# Patient Record
Sex: Female | Born: 1950
Health system: Southern US, Community
[De-identification: ages and names within clinical notes are randomized; demographics above are authoritative.]

## PROBLEM LIST (undated history)

## (undated) ENCOUNTER — Emergency Department (HOSPITAL_COMMUNITY): Discharge status: Absent without leave | Payer: Medicare Other | Source: Home / Self Care

## (undated) DIAGNOSIS — M5136 Other intervertebral disc degeneration, lumbar region: Secondary | ICD-10-CM

## (undated) DIAGNOSIS — T148XXA Other injury of unspecified body region, initial encounter: Secondary | ICD-10-CM

## (undated) DIAGNOSIS — D649 Anemia, unspecified: Secondary | ICD-10-CM

## (undated) DIAGNOSIS — Z87898 Personal history of other specified conditions: Secondary | ICD-10-CM

## (undated) DIAGNOSIS — I10 Essential (primary) hypertension: Secondary | ICD-10-CM

## (undated) DIAGNOSIS — C50919 Malignant neoplasm of unspecified site of unspecified female breast: Secondary | ICD-10-CM

## (undated) DIAGNOSIS — G4730 Sleep apnea, unspecified: Secondary | ICD-10-CM

## (undated) DIAGNOSIS — R7611 Nonspecific reaction to tuberculin skin test without active tuberculosis: Secondary | ICD-10-CM

## (undated) DIAGNOSIS — B1910 Unspecified viral hepatitis B without hepatic coma: Secondary | ICD-10-CM

## (undated) DIAGNOSIS — Z87442 Personal history of urinary calculi: Secondary | ICD-10-CM

## (undated) DIAGNOSIS — E039 Hypothyroidism, unspecified: Secondary | ICD-10-CM

## (undated) DIAGNOSIS — C50019 Malignant neoplasm of nipple and areola, unspecified female breast: Secondary | ICD-10-CM

## (undated) DIAGNOSIS — G35 Multiple sclerosis: Secondary | ICD-10-CM

## (undated) DIAGNOSIS — H5711 Ocular pain, right eye: Secondary | ICD-10-CM

## (undated) DIAGNOSIS — E2740 Unspecified adrenocortical insufficiency: Secondary | ICD-10-CM

## (undated) DIAGNOSIS — Z8489 Family history of other specified conditions: Secondary | ICD-10-CM

## (undated) DIAGNOSIS — R233 Spontaneous ecchymoses: Secondary | ICD-10-CM

## (undated) DIAGNOSIS — Q780 Osteogenesis imperfecta: Secondary | ICD-10-CM

## (undated) DIAGNOSIS — M797 Fibromyalgia: Secondary | ICD-10-CM

## (undated) DIAGNOSIS — R112 Nausea with vomiting, unspecified: Secondary | ICD-10-CM

## (undated) DIAGNOSIS — I878 Other specified disorders of veins: Secondary | ICD-10-CM

## (undated) DIAGNOSIS — E271 Primary adrenocortical insufficiency: Secondary | ICD-10-CM

## (undated) DIAGNOSIS — G4733 Obstructive sleep apnea (adult) (pediatric): Secondary | ICD-10-CM

## (undated) DIAGNOSIS — R238 Other skin changes: Secondary | ICD-10-CM

## (undated) DIAGNOSIS — G43909 Migraine, unspecified, not intractable, without status migrainosus: Secondary | ICD-10-CM

## (undated) DIAGNOSIS — J189 Pneumonia, unspecified organism: Secondary | ICD-10-CM

## (undated) DIAGNOSIS — Z9889 Other specified postprocedural states: Secondary | ICD-10-CM

## (undated) DIAGNOSIS — S069X9A Unspecified intracranial injury with loss of consciousness of unspecified duration, initial encounter: Secondary | ICD-10-CM

## (undated) DIAGNOSIS — T66XXXA Radiation sickness, unspecified, initial encounter: Secondary | ICD-10-CM

## (undated) DIAGNOSIS — G8929 Other chronic pain: Secondary | ICD-10-CM

## (undated) DIAGNOSIS — K219 Gastro-esophageal reflux disease without esophagitis: Secondary | ICD-10-CM

## (undated) DIAGNOSIS — B029 Zoster without complications: Secondary | ICD-10-CM

## (undated) DIAGNOSIS — Z87448 Personal history of other diseases of urinary system: Secondary | ICD-10-CM

## (undated) DIAGNOSIS — G971 Other reaction to spinal and lumbar puncture: Secondary | ICD-10-CM

## (undated) DIAGNOSIS — IMO0002 Reserved for concepts with insufficient information to code with codable children: Secondary | ICD-10-CM

## (undated) DIAGNOSIS — M1990 Unspecified osteoarthritis, unspecified site: Secondary | ICD-10-CM

## (undated) DIAGNOSIS — IMO0001 Reserved for inherently not codable concepts without codable children: Secondary | ICD-10-CM

## (undated) DIAGNOSIS — R229 Localized swelling, mass and lump, unspecified: Secondary | ICD-10-CM

## (undated) DIAGNOSIS — Z8719 Personal history of other diseases of the digestive system: Secondary | ICD-10-CM

## (undated) DIAGNOSIS — J449 Chronic obstructive pulmonary disease, unspecified: Secondary | ICD-10-CM

## (undated) DIAGNOSIS — N393 Stress incontinence (female) (male): Secondary | ICD-10-CM

## (undated) DIAGNOSIS — R296 Repeated falls: Secondary | ICD-10-CM

## (undated) DIAGNOSIS — G2581 Restless legs syndrome: Secondary | ICD-10-CM

## (undated) DIAGNOSIS — K922 Gastrointestinal hemorrhage, unspecified: Secondary | ICD-10-CM

## (undated) DIAGNOSIS — R51 Headache: Secondary | ICD-10-CM

## (undated) DIAGNOSIS — M51369 Other intervertebral disc degeneration, lumbar region without mention of lumbar back pain or lower extremity pain: Secondary | ICD-10-CM

## (undated) HISTORY — PX: LAPAROSCOPY: SHX197

## (undated) HISTORY — PX: COLONOSCOPY: SHX174

## (undated) HISTORY — PX: SALIVARY GLAND SURGERY: SHX768

## (undated) HISTORY — PX: CHOLECYSTECTOMY: SHX55

## (undated) HISTORY — PX: BACK SURGERY: SHX140

## (undated) HISTORY — PX: APPENDECTOMY: SHX54

---
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
## 1977-12-04 HISTORY — PX: BAND HEMORRHOIDECTOMY: SHX1213

## 1985-12-04 HISTORY — PX: ABDOMINAL HYSTERECTOMY: SHX81

## 1986-12-04 HISTORY — PX: BILATERAL OOPHORECTOMY: SHX1221

## 1988-05-04 DIAGNOSIS — G35 Multiple sclerosis: Secondary | ICD-10-CM

## 1988-05-04 HISTORY — DX: Multiple sclerosis: G35

## 1992-07-13 DIAGNOSIS — C50919 Malignant neoplasm of unspecified site of unspecified female breast: Secondary | ICD-10-CM

## 1992-07-13 HISTORY — DX: Malignant neoplasm of unspecified site of unspecified female breast: C50.919

## 1992-07-13 HISTORY — PX: BREAST LUMPECTOMY: SHX2

## 1998-05-25 ENCOUNTER — Encounter: Admission: RE | Admit: 1998-05-25 | Discharge: 1998-08-23 | Payer: Self-pay | Admitting: *Deleted

## 1998-05-26 ENCOUNTER — Inpatient Hospital Stay (HOSPITAL_COMMUNITY): Admission: AD | Admit: 1998-05-26 | Discharge: 1998-05-30 | Payer: Self-pay | Admitting: *Deleted

## 1998-05-30 ENCOUNTER — Emergency Department (HOSPITAL_COMMUNITY): Admission: EM | Admit: 1998-05-30 | Discharge: 1998-05-31 | Payer: Self-pay

## 1998-06-03 ENCOUNTER — Ambulatory Visit (HOSPITAL_COMMUNITY): Admission: RE | Admit: 1998-06-03 | Discharge: 1998-06-03 | Payer: Self-pay | Admitting: Family Medicine

## 1998-06-04 ENCOUNTER — Ambulatory Visit (HOSPITAL_COMMUNITY): Admission: RE | Admit: 1998-06-04 | Discharge: 1998-06-04 | Payer: Self-pay | Admitting: Family Medicine

## 1998-06-08 ENCOUNTER — Ambulatory Visit (HOSPITAL_COMMUNITY): Admission: RE | Admit: 1998-06-08 | Discharge: 1998-06-08 | Payer: Self-pay | Admitting: Family Medicine

## 1998-08-04 ENCOUNTER — Ambulatory Visit (HOSPITAL_COMMUNITY): Admission: RE | Admit: 1998-08-04 | Discharge: 1998-08-04 | Payer: Self-pay | Admitting: Family Medicine

## 1998-11-17 ENCOUNTER — Inpatient Hospital Stay (HOSPITAL_COMMUNITY): Admission: AD | Admit: 1998-11-17 | Discharge: 1998-11-23 | Payer: Self-pay | Admitting: *Deleted

## 1998-11-18 ENCOUNTER — Encounter: Payer: Self-pay | Admitting: Family Medicine

## 1998-11-19 ENCOUNTER — Encounter: Payer: Self-pay | Admitting: Internal Medicine

## 1998-11-21 ENCOUNTER — Encounter: Payer: Self-pay | Admitting: Internal Medicine

## 1998-12-22 ENCOUNTER — Inpatient Hospital Stay (HOSPITAL_COMMUNITY): Admission: EM | Admit: 1998-12-22 | Discharge: 1998-12-27 | Payer: Self-pay | Admitting: Internal Medicine

## 1998-12-25 ENCOUNTER — Encounter: Payer: Self-pay | Admitting: Internal Medicine

## 1999-03-17 ENCOUNTER — Ambulatory Visit (HOSPITAL_COMMUNITY): Admission: RE | Admit: 1999-03-17 | Discharge: 1999-03-17 | Payer: Self-pay | Admitting: Family Medicine

## 1999-03-17 ENCOUNTER — Encounter: Payer: Self-pay | Admitting: Family Medicine

## 1999-11-30 ENCOUNTER — Encounter: Admission: RE | Admit: 1999-11-30 | Discharge: 1999-11-30 | Payer: Self-pay | Admitting: Oncology

## 1999-11-30 ENCOUNTER — Encounter: Payer: Self-pay | Admitting: Oncology

## 2000-04-18 ENCOUNTER — Ambulatory Visit (HOSPITAL_COMMUNITY): Admission: RE | Admit: 2000-04-18 | Discharge: 2000-04-18 | Payer: Self-pay | Admitting: Neurological Surgery

## 2000-04-18 ENCOUNTER — Encounter: Payer: Self-pay | Admitting: Neurological Surgery

## 2000-05-02 ENCOUNTER — Ambulatory Visit (HOSPITAL_COMMUNITY): Admission: RE | Admit: 2000-05-02 | Discharge: 2000-05-02 | Payer: Self-pay | Admitting: Neurological Surgery

## 2000-05-02 ENCOUNTER — Encounter: Payer: Self-pay | Admitting: Neurological Surgery

## 2000-09-12 ENCOUNTER — Other Ambulatory Visit: Admission: RE | Admit: 2000-09-12 | Discharge: 2000-09-12 | Payer: Self-pay | Admitting: *Deleted

## 2000-12-03 ENCOUNTER — Encounter: Admission: RE | Admit: 2000-12-03 | Discharge: 2000-12-03 | Payer: Self-pay | Admitting: Oncology

## 2000-12-03 ENCOUNTER — Encounter: Payer: Self-pay | Admitting: Oncology

## 2001-01-09 ENCOUNTER — Encounter: Payer: Self-pay | Admitting: Oncology

## 2001-01-09 ENCOUNTER — Encounter: Admission: RE | Admit: 2001-01-09 | Discharge: 2001-01-09 | Payer: Self-pay | Admitting: Oncology

## 2001-01-10 ENCOUNTER — Encounter: Payer: Self-pay | Admitting: Oncology

## 2001-01-10 ENCOUNTER — Encounter: Admission: RE | Admit: 2001-01-10 | Discharge: 2001-01-10 | Payer: Self-pay | Admitting: Oncology

## 2001-01-23 ENCOUNTER — Ambulatory Visit (HOSPITAL_COMMUNITY): Admission: RE | Admit: 2001-01-23 | Discharge: 2001-01-23 | Payer: Self-pay | Admitting: Gastroenterology

## 2001-02-27 ENCOUNTER — Encounter: Payer: Self-pay | Admitting: Specialist

## 2001-02-27 ENCOUNTER — Ambulatory Visit (HOSPITAL_COMMUNITY): Admission: RE | Admit: 2001-02-27 | Discharge: 2001-02-27 | Payer: Self-pay | Admitting: Specialist

## 2001-03-04 ENCOUNTER — Encounter: Admission: RE | Admit: 2001-03-04 | Discharge: 2001-03-04 | Payer: Self-pay | Admitting: Radiology

## 2001-03-04 ENCOUNTER — Encounter: Payer: Self-pay | Admitting: Radiology

## 2001-03-07 ENCOUNTER — Encounter: Admission: RE | Admit: 2001-03-07 | Discharge: 2001-03-07 | Payer: Self-pay | Admitting: Oncology

## 2001-03-07 ENCOUNTER — Encounter: Payer: Self-pay | Admitting: Oncology

## 2001-07-07 ENCOUNTER — Inpatient Hospital Stay (HOSPITAL_COMMUNITY): Admission: EM | Admit: 2001-07-07 | Discharge: 2001-07-17 | Payer: Self-pay

## 2001-07-08 ENCOUNTER — Encounter: Payer: Self-pay | Admitting: Neurology

## 2001-07-09 ENCOUNTER — Encounter: Payer: Self-pay | Admitting: Neurology

## 2001-07-23 ENCOUNTER — Encounter: Admission: RE | Admit: 2001-07-23 | Discharge: 2001-08-03 | Payer: Self-pay | Admitting: Anesthesiology

## 2001-08-20 ENCOUNTER — Inpatient Hospital Stay (HOSPITAL_COMMUNITY): Admission: AD | Admit: 2001-08-20 | Discharge: 2001-08-24 | Payer: Self-pay | Admitting: Obstetrics and Gynecology

## 2001-08-21 ENCOUNTER — Encounter: Payer: Self-pay | Admitting: Internal Medicine

## 2001-12-09 ENCOUNTER — Encounter: Payer: Self-pay | Admitting: Oncology

## 2001-12-09 ENCOUNTER — Encounter: Admission: RE | Admit: 2001-12-09 | Discharge: 2001-12-09 | Payer: Self-pay | Admitting: Oncology

## 2002-01-01 ENCOUNTER — Encounter: Admission: RE | Admit: 2002-01-01 | Discharge: 2002-01-01 | Payer: Self-pay | Admitting: *Deleted

## 2002-01-01 ENCOUNTER — Encounter: Payer: Self-pay | Admitting: *Deleted

## 2002-04-15 ENCOUNTER — Encounter: Payer: Self-pay | Admitting: *Deleted

## 2002-04-15 ENCOUNTER — Encounter: Admission: RE | Admit: 2002-04-15 | Discharge: 2002-04-15 | Payer: Self-pay | Admitting: *Deleted

## 2002-05-23 ENCOUNTER — Inpatient Hospital Stay (HOSPITAL_COMMUNITY): Admission: EM | Admit: 2002-05-23 | Discharge: 2002-06-04 | Payer: Self-pay | Admitting: Emergency Medicine

## 2002-08-21 ENCOUNTER — Inpatient Hospital Stay (HOSPITAL_COMMUNITY): Admission: AD | Admit: 2002-08-21 | Discharge: 2002-09-05 | Payer: Self-pay | Admitting: Pediatrics

## 2002-08-21 ENCOUNTER — Encounter: Payer: Self-pay | Admitting: Pediatrics

## 2002-08-27 ENCOUNTER — Encounter: Admission: RE | Admit: 2002-08-27 | Discharge: 2002-08-27 | Payer: Self-pay | Admitting: Pediatrics

## 2002-08-27 ENCOUNTER — Encounter: Payer: Self-pay | Admitting: Pediatrics

## 2002-12-16 ENCOUNTER — Ambulatory Visit (HOSPITAL_COMMUNITY): Admission: RE | Admit: 2002-12-16 | Discharge: 2002-12-16 | Payer: Self-pay | Admitting: Pediatrics

## 2002-12-16 ENCOUNTER — Encounter: Payer: Self-pay | Admitting: Pediatrics

## 2003-01-05 ENCOUNTER — Encounter: Payer: Self-pay | Admitting: Neurological Surgery

## 2003-01-05 ENCOUNTER — Observation Stay (HOSPITAL_COMMUNITY): Admission: RE | Admit: 2003-01-05 | Discharge: 2003-01-06 | Payer: Self-pay | Admitting: Neurological Surgery

## 2003-01-11 ENCOUNTER — Emergency Department (HOSPITAL_COMMUNITY): Admission: EM | Admit: 2003-01-11 | Discharge: 2003-01-11 | Payer: Self-pay

## 2003-02-10 ENCOUNTER — Emergency Department (HOSPITAL_COMMUNITY): Admission: EM | Admit: 2003-02-10 | Discharge: 2003-02-10 | Payer: Self-pay | Admitting: Emergency Medicine

## 2003-02-10 ENCOUNTER — Encounter: Payer: Self-pay | Admitting: Emergency Medicine

## 2003-03-05 ENCOUNTER — Encounter: Admission: RE | Admit: 2003-03-05 | Discharge: 2003-03-05 | Payer: Self-pay | Admitting: Oncology

## 2003-03-05 ENCOUNTER — Encounter: Payer: Self-pay | Admitting: Oncology

## 2003-03-09 ENCOUNTER — Encounter (HOSPITAL_COMMUNITY): Admission: RE | Admit: 2003-03-09 | Discharge: 2003-03-09 | Payer: Self-pay | Admitting: Oncology

## 2003-10-07 ENCOUNTER — Ambulatory Visit (HOSPITAL_COMMUNITY): Admission: RE | Admit: 2003-10-07 | Discharge: 2003-10-07 | Payer: Self-pay | Admitting: Gastroenterology

## 2003-12-17 ENCOUNTER — Encounter: Admission: RE | Admit: 2003-12-17 | Discharge: 2003-12-17 | Payer: Self-pay | Admitting: Neurological Surgery

## 2004-01-05 ENCOUNTER — Ambulatory Visit (HOSPITAL_COMMUNITY): Admission: RE | Admit: 2004-01-05 | Discharge: 2004-01-05 | Payer: Self-pay | Admitting: Neurological Surgery

## 2004-01-21 ENCOUNTER — Ambulatory Visit (HOSPITAL_COMMUNITY): Admission: RE | Admit: 2004-01-21 | Discharge: 2004-01-22 | Payer: Self-pay | Admitting: Neurological Surgery

## 2004-03-14 ENCOUNTER — Ambulatory Visit (HOSPITAL_COMMUNITY): Admission: RE | Admit: 2004-03-14 | Discharge: 2004-03-14 | Payer: Self-pay | Admitting: Neurological Surgery

## 2004-03-28 ENCOUNTER — Inpatient Hospital Stay (HOSPITAL_COMMUNITY): Admission: EM | Admit: 2004-03-28 | Discharge: 2004-04-13 | Payer: Self-pay

## 2004-04-20 ENCOUNTER — Encounter: Admission: RE | Admit: 2004-04-20 | Discharge: 2004-04-20 | Payer: Self-pay | Admitting: Oncology

## 2004-09-26 ENCOUNTER — Inpatient Hospital Stay (HOSPITAL_COMMUNITY): Admission: AD | Admit: 2004-09-26 | Discharge: 2004-10-19 | Payer: Self-pay | Admitting: Urology

## 2005-01-04 ENCOUNTER — Ambulatory Visit (HOSPITAL_COMMUNITY): Admission: RE | Admit: 2005-01-04 | Discharge: 2005-01-04 | Payer: Self-pay | Admitting: Family Medicine

## 2005-01-12 ENCOUNTER — Ambulatory Visit: Payer: Self-pay | Admitting: Internal Medicine

## 2005-02-13 ENCOUNTER — Ambulatory Visit (HOSPITAL_COMMUNITY): Admission: RE | Admit: 2005-02-13 | Discharge: 2005-02-13 | Payer: Self-pay | Admitting: Gastroenterology

## 2005-02-22 ENCOUNTER — Ambulatory Visit: Payer: Self-pay | Admitting: Internal Medicine

## 2005-03-28 ENCOUNTER — Ambulatory Visit: Payer: Self-pay | Admitting: Internal Medicine

## 2005-03-30 ENCOUNTER — Encounter: Admission: RE | Admit: 2005-03-30 | Discharge: 2005-03-30 | Payer: Self-pay | Admitting: *Deleted

## 2005-04-28 ENCOUNTER — Ambulatory Visit: Payer: Self-pay | Admitting: Oncology

## 2005-07-21 ENCOUNTER — Ambulatory Visit: Admission: RE | Admit: 2005-07-21 | Discharge: 2005-07-21 | Payer: Self-pay | Admitting: Family Medicine

## 2005-07-28 ENCOUNTER — Ambulatory Visit: Payer: Self-pay | Admitting: Internal Medicine

## 2005-08-10 ENCOUNTER — Ambulatory Visit: Payer: Self-pay | Admitting: Internal Medicine

## 2005-10-16 ENCOUNTER — Ambulatory Visit (HOSPITAL_COMMUNITY): Admission: RE | Admit: 2005-10-16 | Discharge: 2005-10-16 | Payer: Self-pay | Admitting: Family Medicine

## 2005-10-25 ENCOUNTER — Ambulatory Visit (HOSPITAL_COMMUNITY): Admission: RE | Admit: 2005-10-25 | Discharge: 2005-10-25 | Payer: Self-pay | Admitting: Family Medicine

## 2005-10-25 ENCOUNTER — Encounter: Payer: Self-pay | Admitting: Cardiology

## 2005-10-25 ENCOUNTER — Ambulatory Visit: Payer: Self-pay | Admitting: Cardiology

## 2005-11-17 ENCOUNTER — Encounter: Admission: RE | Admit: 2005-11-17 | Discharge: 2005-12-03 | Payer: Self-pay | Admitting: Family Medicine

## 2006-04-04 ENCOUNTER — Encounter: Payer: Self-pay | Admitting: Family Medicine

## 2006-07-19 ENCOUNTER — Emergency Department (HOSPITAL_COMMUNITY): Admission: EM | Admit: 2006-07-19 | Discharge: 2006-07-20 | Payer: Self-pay | Admitting: Emergency Medicine

## 2007-03-12 ENCOUNTER — Ambulatory Visit: Payer: Self-pay | Admitting: Pulmonary Disease

## 2007-03-12 LAB — CONVERTED CEMR LAB
AST: 28 units/L (ref 0–37)
Albumin: 3.2 g/dL — ABNORMAL LOW (ref 3.5–5.2)
Alkaline Phosphatase: 103 units/L (ref 39–117)
BUN: 16 mg/dL (ref 6–23)
Basophils Absolute: 0 10*3/uL (ref 0.0–0.1)
Basophils Relative: 0.3 % (ref 0.0–1.0)
CO2: 34 meq/L — ABNORMAL HIGH (ref 19–32)
Chloride: 102 meq/L (ref 96–112)
Creatinine, Ser: 0.6 mg/dL (ref 0.4–1.2)
HCT: 37.5 % (ref 36.0–46.0)
Hemoglobin: 12.6 g/dL (ref 12.0–15.0)
Monocytes Absolute: 0.5 10*3/uL (ref 0.2–0.7)
Monocytes Relative: 6.9 % (ref 3.0–11.0)
Neutrophils Relative %: 58.1 % (ref 43.0–77.0)
Potassium: 3.4 meq/L — ABNORMAL LOW (ref 3.5–5.1)
Pro B Natriuretic peptide (BNP): 147 pg/mL — ABNORMAL HIGH (ref 0.0–100.0)
RBC: 4.63 M/uL (ref 3.87–5.11)
RDW: 15 % — ABNORMAL HIGH (ref 11.5–14.6)
Total Bilirubin: 0.4 mg/dL (ref 0.3–1.2)
Total Protein: 7.6 g/dL (ref 6.0–8.3)

## 2007-03-15 ENCOUNTER — Ambulatory Visit: Payer: Self-pay | Admitting: Pulmonary Disease

## 2007-03-15 ENCOUNTER — Ambulatory Visit (HOSPITAL_COMMUNITY): Admission: RE | Admit: 2007-03-15 | Discharge: 2007-03-15 | Payer: Self-pay | Admitting: Pulmonary Disease

## 2007-03-19 ENCOUNTER — Ambulatory Visit: Payer: Self-pay | Admitting: *Deleted

## 2007-03-19 ENCOUNTER — Ambulatory Visit: Payer: Self-pay | Admitting: Internal Medicine

## 2007-04-02 ENCOUNTER — Ambulatory Visit: Payer: Self-pay | Admitting: Internal Medicine

## 2007-04-23 ENCOUNTER — Ambulatory Visit: Payer: Self-pay | Admitting: Internal Medicine

## 2007-04-26 ENCOUNTER — Encounter: Admission: RE | Admit: 2007-04-26 | Discharge: 2007-04-26 | Payer: Self-pay | Admitting: Oncology

## 2007-09-03 ENCOUNTER — Encounter: Admission: RE | Admit: 2007-09-03 | Discharge: 2007-09-03 | Payer: Self-pay | Admitting: Family Medicine

## 2007-09-25 ENCOUNTER — Encounter: Admission: RE | Admit: 2007-09-25 | Discharge: 2007-09-25 | Payer: Self-pay | Admitting: Family Medicine

## 2007-10-12 ENCOUNTER — Encounter: Admission: RE | Admit: 2007-10-12 | Discharge: 2007-10-12 | Payer: Self-pay | Admitting: Family Medicine

## 2008-01-04 ENCOUNTER — Emergency Department (HOSPITAL_COMMUNITY): Admission: EM | Admit: 2008-01-04 | Discharge: 2008-01-04 | Payer: Self-pay | Admitting: Emergency Medicine

## 2008-02-24 ENCOUNTER — Inpatient Hospital Stay (HOSPITAL_COMMUNITY): Admission: AD | Admit: 2008-02-24 | Discharge: 2008-03-03 | Payer: Self-pay | Admitting: Internal Medicine

## 2008-03-26 ENCOUNTER — Encounter: Payer: Self-pay | Admitting: Family Medicine

## 2008-03-26 ENCOUNTER — Ambulatory Visit: Payer: Self-pay | Admitting: Vascular Surgery

## 2008-03-26 ENCOUNTER — Ambulatory Visit (HOSPITAL_COMMUNITY): Admission: RE | Admit: 2008-03-26 | Discharge: 2008-03-26 | Payer: Self-pay | Admitting: Family Medicine

## 2008-04-29 ENCOUNTER — Encounter: Admission: RE | Admit: 2008-04-29 | Discharge: 2008-04-29 | Payer: Self-pay | Admitting: Family Medicine

## 2008-05-14 ENCOUNTER — Ambulatory Visit: Payer: Self-pay | Admitting: Internal Medicine

## 2008-05-14 ENCOUNTER — Inpatient Hospital Stay (HOSPITAL_COMMUNITY): Admission: AD | Admit: 2008-05-14 | Discharge: 2008-05-21 | Payer: Self-pay | Admitting: Internal Medicine

## 2008-05-15 ENCOUNTER — Encounter (INDEPENDENT_AMBULATORY_CARE_PROVIDER_SITE_OTHER): Payer: Self-pay | Admitting: Internal Medicine

## 2008-05-15 ENCOUNTER — Ambulatory Visit: Payer: Self-pay | Admitting: Vascular Surgery

## 2008-05-17 ENCOUNTER — Ambulatory Visit: Payer: Self-pay | Admitting: Infectious Diseases

## 2008-05-18 ENCOUNTER — Encounter (INDEPENDENT_AMBULATORY_CARE_PROVIDER_SITE_OTHER): Payer: Self-pay | Admitting: Internal Medicine

## 2008-05-20 DIAGNOSIS — E669 Obesity, unspecified: Secondary | ICD-10-CM

## 2008-09-14 ENCOUNTER — Encounter: Admission: RE | Admit: 2008-09-14 | Discharge: 2008-09-14 | Payer: Self-pay | Admitting: Family Medicine

## 2008-12-04 DIAGNOSIS — C50019 Malignant neoplasm of nipple and areola, unspecified female breast: Secondary | ICD-10-CM

## 2008-12-04 HISTORY — DX: Malignant neoplasm of nipple and areola, unspecified female breast: C50.019

## 2008-12-04 HISTORY — PX: MASTECTOMY: SHX3

## 2009-05-24 ENCOUNTER — Ambulatory Visit: Payer: Self-pay | Admitting: Oncology

## 2009-05-26 ENCOUNTER — Encounter: Admission: RE | Admit: 2009-05-26 | Discharge: 2009-05-26 | Payer: Self-pay | Admitting: Oncology

## 2009-05-27 LAB — COMPREHENSIVE METABOLIC PANEL
AST: 19 U/L (ref 0–37)
Albumin: 4.1 g/dL (ref 3.5–5.2)
Alkaline Phosphatase: 66 U/L (ref 39–117)
BUN: 21 mg/dL (ref 6–23)
Calcium: 9.2 mg/dL (ref 8.4–10.5)
Creatinine, Ser: 0.78 mg/dL (ref 0.40–1.20)
Glucose, Bld: 127 mg/dL — ABNORMAL HIGH (ref 70–99)

## 2009-05-27 LAB — CBC WITH DIFFERENTIAL/PLATELET
Basophils Absolute: 0 10*3/uL (ref 0.0–0.1)
EOS%: 2.4 % (ref 0.0–7.0)
Eosinophils Absolute: 0.1 10*3/uL (ref 0.0–0.5)
HCT: 40.8 % (ref 34.8–46.6)
HGB: 13.8 g/dL (ref 11.6–15.9)
MCH: 30.3 pg (ref 25.1–34.0)
MCV: 89.8 fL (ref 79.5–101.0)
MONO%: 5.6 % (ref 0.0–14.0)
NEUT#: 3 10*3/uL (ref 1.5–6.5)
NEUT%: 49.3 % (ref 38.4–76.8)
Platelets: 212 10*3/uL (ref 145–400)

## 2009-05-27 LAB — CEA: CEA: 1.4 ng/mL (ref 0.0–5.0)

## 2009-06-14 ENCOUNTER — Encounter (INDEPENDENT_AMBULATORY_CARE_PROVIDER_SITE_OTHER): Payer: Self-pay | Admitting: General Surgery

## 2009-06-14 ENCOUNTER — Ambulatory Visit (HOSPITAL_COMMUNITY): Admission: RE | Admit: 2009-06-14 | Discharge: 2009-06-15 | Payer: Self-pay | Admitting: General Surgery

## 2009-06-24 ENCOUNTER — Ambulatory Visit: Payer: Self-pay | Admitting: Oncology

## 2009-06-28 LAB — CBC WITH DIFFERENTIAL/PLATELET
Basophils Absolute: 0 10*3/uL (ref 0.0–0.1)
Eosinophils Absolute: 0.1 10*3/uL (ref 0.0–0.5)
HCT: 36.4 % (ref 34.8–46.6)
HGB: 11.7 g/dL (ref 11.6–15.9)
MCH: 28.6 pg (ref 25.1–34.0)
MONO#: 0.6 10*3/uL (ref 0.1–0.9)
NEUT#: 4 10*3/uL (ref 1.5–6.5)
NEUT%: 61.5 % (ref 38.4–76.8)
WBC: 6.5 10*3/uL (ref 3.9–10.3)
lymph#: 1.7 10*3/uL (ref 0.9–3.3)

## 2009-09-15 ENCOUNTER — Ambulatory Visit: Payer: Self-pay | Admitting: Oncology

## 2009-09-17 LAB — CBC WITH DIFFERENTIAL/PLATELET
BASO%: 0.4 % (ref 0.0–2.0)
HCT: 37 % (ref 34.8–46.6)
MCHC: 33.2 g/dL (ref 31.5–36.0)
MONO#: 0.5 10*3/uL (ref 0.1–0.9)
NEUT%: 59.7 % (ref 38.4–76.8)
RDW: 14.3 % (ref 11.2–14.5)
WBC: 6.1 10*3/uL (ref 3.9–10.3)
lymph#: 1.9 10*3/uL (ref 0.9–3.3)

## 2009-09-20 ENCOUNTER — Encounter: Admission: RE | Admit: 2009-09-20 | Discharge: 2009-09-20 | Payer: Self-pay | Admitting: Gastroenterology

## 2009-10-05 ENCOUNTER — Emergency Department (HOSPITAL_COMMUNITY): Admission: EM | Admit: 2009-10-05 | Discharge: 2009-10-05 | Payer: Self-pay | Admitting: Emergency Medicine

## 2009-10-07 ENCOUNTER — Encounter: Admission: RE | Admit: 2009-10-07 | Discharge: 2009-10-07 | Payer: Self-pay | Admitting: Oncology

## 2009-11-16 ENCOUNTER — Inpatient Hospital Stay (HOSPITAL_COMMUNITY): Admission: EM | Admit: 2009-11-16 | Discharge: 2009-11-18 | Payer: Self-pay | Admitting: Emergency Medicine

## 2009-11-16 ENCOUNTER — Ambulatory Visit: Payer: Self-pay | Admitting: Internal Medicine

## 2009-11-17 ENCOUNTER — Encounter (INDEPENDENT_AMBULATORY_CARE_PROVIDER_SITE_OTHER): Payer: Self-pay | Admitting: Internal Medicine

## 2009-12-15 ENCOUNTER — Ambulatory Visit: Payer: Self-pay | Admitting: Oncology

## 2009-12-17 LAB — MORPHOLOGY: PLT EST: ADEQUATE

## 2009-12-17 LAB — CBC WITH DIFFERENTIAL/PLATELET
BASO%: 0.2 % (ref 0.0–2.0)
Eosinophils Absolute: 0.1 10*3/uL (ref 0.0–0.5)
LYMPH%: 24.7 % (ref 14.0–49.7)
MCHC: 31.4 g/dL — ABNORMAL LOW (ref 31.5–36.0)
MONO#: 0.5 10*3/uL (ref 0.1–0.9)
NEUT#: 4.3 10*3/uL (ref 1.5–6.5)
RBC: 4.35 10*6/uL (ref 3.70–5.45)
RDW: 18.1 % — ABNORMAL HIGH (ref 11.2–14.5)
WBC: 6.4 10*3/uL (ref 3.9–10.3)
lymph#: 1.6 10*3/uL (ref 0.9–3.3)

## 2009-12-20 LAB — KAPPA/LAMBDA LIGHT CHAINS
Kappa free light chain: 0.89 mg/dL (ref 0.33–1.94)
Lambda Free Lght Chn: 1 mg/dL (ref 0.57–2.63)

## 2010-01-17 ENCOUNTER — Ambulatory Visit: Payer: Self-pay | Admitting: Oncology

## 2010-01-17 LAB — CBC WITH DIFFERENTIAL/PLATELET
BASO%: 1 % (ref 0.0–2.0)
EOS%: 2.4 % (ref 0.0–7.0)
LYMPH%: 39.8 % (ref 14.0–49.7)
MCHC: 31.5 g/dL (ref 31.5–36.0)
MCV: 83.5 fL (ref 79.5–101.0)
MONO%: 7.5 % (ref 0.0–14.0)
Platelets: 185 10*3/uL (ref 145–400)
RBC: 4.91 10*6/uL (ref 3.70–5.45)
RDW: 17.3 % — ABNORMAL HIGH (ref 11.2–14.5)
nRBC: 0 % (ref 0–0)

## 2010-02-23 ENCOUNTER — Emergency Department (HOSPITAL_COMMUNITY): Admission: EM | Admit: 2010-02-23 | Discharge: 2010-02-23 | Payer: Self-pay | Admitting: Emergency Medicine

## 2010-04-29 ENCOUNTER — Ambulatory Visit (HOSPITAL_COMMUNITY): Admission: RE | Admit: 2010-04-29 | Discharge: 2010-04-29 | Payer: Self-pay | Admitting: General Surgery

## 2010-05-09 ENCOUNTER — Ambulatory Visit: Payer: Self-pay | Admitting: Oncology

## 2010-05-09 ENCOUNTER — Encounter: Admission: RE | Admit: 2010-05-09 | Discharge: 2010-05-12 | Payer: Self-pay | Admitting: Oncology

## 2010-05-11 LAB — CBC WITH DIFFERENTIAL/PLATELET
BASO%: 0.5 % (ref 0.0–2.0)
Eosinophils Absolute: 0.2 10*3/uL (ref 0.0–0.5)
LYMPH%: 19.3 % (ref 14.0–49.7)
MCHC: 32.8 g/dL (ref 31.5–36.0)
MONO#: 0.5 10*3/uL (ref 0.1–0.9)
MONO%: 6.7 % (ref 0.0–14.0)
NEUT#: 5.1 10*3/uL (ref 1.5–6.5)
Platelets: 220 10*3/uL (ref 145–400)
RBC: 4.16 10*6/uL (ref 3.70–5.45)
RDW: 19 % — ABNORMAL HIGH (ref 11.2–14.5)
WBC: 7.2 10*3/uL (ref 3.9–10.3)

## 2010-05-11 LAB — COMPREHENSIVE METABOLIC PANEL
ALT: 19 U/L (ref 0–35)
Albumin: 3.7 g/dL (ref 3.5–5.2)
Alkaline Phosphatase: 79 U/L (ref 39–117)
CO2: 26 mEq/L (ref 19–32)
Potassium: 4.5 mEq/L (ref 3.5–5.3)
Sodium: 138 mEq/L (ref 135–145)
Total Bilirubin: 0.3 mg/dL (ref 0.3–1.2)
Total Protein: 6.5 g/dL (ref 6.0–8.3)

## 2010-05-17 ENCOUNTER — Encounter: Admission: RE | Admit: 2010-05-17 | Discharge: 2010-05-17 | Payer: Self-pay | Admitting: Family Medicine

## 2010-06-03 ENCOUNTER — Ambulatory Visit: Payer: Self-pay | Admitting: Radiology

## 2010-06-03 ENCOUNTER — Emergency Department (HOSPITAL_BASED_OUTPATIENT_CLINIC_OR_DEPARTMENT_OTHER): Admission: EM | Admit: 2010-06-03 | Discharge: 2010-06-03 | Payer: Self-pay | Admitting: Emergency Medicine

## 2010-06-23 ENCOUNTER — Encounter: Admission: RE | Admit: 2010-06-23 | Discharge: 2010-06-23 | Payer: Self-pay | Admitting: Plastic Surgery

## 2010-08-11 ENCOUNTER — Ambulatory Visit (HOSPITAL_COMMUNITY): Admission: RE | Admit: 2010-08-11 | Discharge: 2010-08-11 | Payer: Self-pay | Admitting: Plastic Surgery

## 2010-08-17 ENCOUNTER — Ambulatory Visit: Payer: Self-pay | Admitting: Vascular Surgery

## 2010-08-17 ENCOUNTER — Encounter (INDEPENDENT_AMBULATORY_CARE_PROVIDER_SITE_OTHER): Payer: Self-pay | Admitting: Plastic Surgery

## 2010-08-17 ENCOUNTER — Ambulatory Visit: Admission: RE | Admit: 2010-08-17 | Discharge: 2010-08-17 | Payer: Self-pay | Admitting: Plastic Surgery

## 2010-11-30 ENCOUNTER — Ambulatory Visit: Payer: Self-pay | Admitting: Oncology

## 2010-12-25 ENCOUNTER — Encounter: Payer: Self-pay | Admitting: Plastic Surgery

## 2010-12-25 ENCOUNTER — Encounter: Payer: Self-pay | Admitting: Family Medicine

## 2010-12-25 ENCOUNTER — Encounter: Payer: Self-pay | Admitting: Oncology

## 2011-02-16 LAB — HEPATIC FUNCTION PANEL
ALT: 32 U/L (ref 0–35)
Alkaline Phosphatase: 82 U/L (ref 39–117)
Bilirubin, Direct: 0.1 mg/dL (ref 0.0–0.3)
Indirect Bilirubin: 0.1 mg/dL — ABNORMAL LOW (ref 0.3–0.9)
Total Protein: 6.5 g/dL (ref 6.0–8.3)

## 2011-02-16 LAB — CBC
MCV: 80.5 fL (ref 78.0–100.0)
Platelets: 244 10*3/uL (ref 150–400)
RBC: 4.72 MIL/uL (ref 3.87–5.11)
RDW: 15.6 % — ABNORMAL HIGH (ref 11.5–15.5)
WBC: 6.6 10*3/uL (ref 4.0–10.5)

## 2011-02-16 LAB — BASIC METABOLIC PANEL
BUN: 15 mg/dL (ref 6–23)
Calcium: 8.8 mg/dL (ref 8.4–10.5)
Creatinine, Ser: 0.84 mg/dL (ref 0.4–1.2)
GFR calc Af Amer: >60 mL/min (ref >60)
GFR calc non Af Amer: >60 mL/min (ref >60)
Potassium: 3.9 mEq/L (ref 3.5–5.1)
Sodium: 134 mEq/L — ABNORMAL LOW (ref 135–145)

## 2011-02-16 LAB — SURGICAL PCR SCREEN: Staphylococcus aureus: NEGATIVE

## 2011-02-20 ENCOUNTER — Other Ambulatory Visit: Payer: Self-pay | Admitting: Neurology

## 2011-02-20 DIAGNOSIS — E236 Other disorders of pituitary gland: Secondary | ICD-10-CM

## 2011-02-20 DIAGNOSIS — E271 Primary adrenocortical insufficiency: Secondary | ICD-10-CM

## 2011-02-20 DIAGNOSIS — H547 Unspecified visual loss: Secondary | ICD-10-CM

## 2011-02-20 LAB — CBC
HCT: 37 % (ref 36.0–46.0)
Platelets: 227 10*3/uL (ref 150–400)
WBC: 6.8 10*3/uL (ref 4.0–10.5)

## 2011-02-20 LAB — URINE CULTURE
Colony Count: NO GROWTH
Culture: NO GROWTH

## 2011-02-23 ENCOUNTER — Other Ambulatory Visit: Payer: Self-pay

## 2011-02-26 LAB — POCT CARDIAC MARKERS: Troponin i, poc: <0.05 ng/mL (ref 0.00–0.09)

## 2011-02-26 LAB — DIFFERENTIAL
Basophils Absolute: 0.1 10*3/uL (ref 0.0–0.1)
Basophils Relative: 1 % (ref 0–1)
Lymphocytes Relative: 29 % (ref 12–46)
Monocytes Absolute: 0.4 10*3/uL (ref 0.1–1.0)
Neutro Abs: 4 10*3/uL (ref 1.7–7.7)
Neutrophils Relative %: 64 % (ref 43–77)

## 2011-02-26 LAB — COMPREHENSIVE METABOLIC PANEL
Alkaline Phosphatase: 77 U/L (ref 39–117)
BUN: 11 mg/dL (ref 6–23)
CO2: 28 mEq/L (ref 19–32)
Chloride: 105 mEq/L (ref 96–112)
Creatinine, Ser: 0.68 mg/dL (ref 0.4–1.2)
GFR calc Af Amer: >60 mL/min (ref >60)
GFR calc non Af Amer: >60 mL/min (ref >60)
Glucose, Bld: 130 mg/dL — ABNORMAL HIGH (ref 70–99)
Sodium: 138 mEq/L (ref 135–145)
Total Bilirubin: 0.5 mg/dL (ref 0.3–1.2)

## 2011-02-26 LAB — PROTIME-INR
INR: 1.06 (ref 0.00–1.49)
Prothrombin Time: 13.7 seconds (ref 11.6–15.2)

## 2011-02-26 LAB — CBC
MCHC: 31.8 g/dL (ref 30.0–36.0)
RBC: 4.52 MIL/uL (ref 3.87–5.11)
RDW: 15.9 % — ABNORMAL HIGH (ref 11.5–15.5)

## 2011-02-26 LAB — APTT: aPTT: 26 seconds (ref 24–37)

## 2011-02-28 ENCOUNTER — Other Ambulatory Visit: Payer: Self-pay

## 2011-02-28 ENCOUNTER — Ambulatory Visit
Admission: RE | Admit: 2011-02-28 | Discharge: 2011-02-28 | Disposition: A | Discharge status: Home / Self Care | Payer: BC Managed Care – PPO | Source: Ambulatory Visit | Attending: Neurology | Admitting: Neurology

## 2011-02-28 DIAGNOSIS — H547 Unspecified visual loss: Secondary | ICD-10-CM

## 2011-02-28 DIAGNOSIS — E236 Other disorders of pituitary gland: Secondary | ICD-10-CM

## 2011-02-28 DIAGNOSIS — E271 Primary adrenocortical insufficiency: Secondary | ICD-10-CM

## 2011-02-28 MED ORDER — IOHEXOL 300 MG/ML  SOLN
75.0000 mL | Freq: Once | INTRAMUSCULAR | Status: AC | PRN
  Administered 2011-02-28: 75 mL via INTRAVENOUS

## 2011-03-06 LAB — CBC
HCT: 29.8 % — ABNORMAL LOW (ref 36.0–46.0)
Hemoglobin: 9.5 g/dL — ABNORMAL LOW (ref 12.0–15.0)
WBC: 4.1 10*3/uL (ref 4.0–10.5)

## 2011-03-06 LAB — DIFFERENTIAL
Eosinophils Relative: 2 % (ref 0–5)
Lymphocytes Relative: 42 % (ref 12–46)
Lymphs Abs: 1.7 10*3/uL (ref 0.7–4.0)
Monocytes Absolute: 0.5 10*3/uL (ref 0.1–1.0)

## 2011-03-07 LAB — IRON AND TIBC
Saturation Ratios: 31 % (ref 20–55)
TIBC: 227 ug/dL — ABNORMAL LOW (ref 250–470)
UIBC: 156 ug/dL

## 2011-03-07 LAB — CORTISOL: Cortisol, Plasma: 21.2 ug/dL

## 2011-03-07 LAB — COMPREHENSIVE METABOLIC PANEL
ALT: 29 U/L (ref 0–35)
ALT: 29 U/L (ref 0–35)
AST: 26 U/L (ref 0–37)
Albumin: 3.4 g/dL — ABNORMAL LOW (ref 3.5–5.2)
Alkaline Phosphatase: 63 U/L (ref 39–117)
Alkaline Phosphatase: 66 U/L (ref 39–117)
BUN: 31 mg/dL — ABNORMAL HIGH (ref 6–23)
CO2: 29 mEq/L (ref 19–32)
Chloride: 98 mEq/L (ref 96–112)
GFR calc Af Amer: 22 mL/min — ABNORMAL LOW (ref >60)
GFR calc non Af Amer: 30 mL/min — ABNORMAL LOW (ref >60)
Glucose, Bld: 118 mg/dL — ABNORMAL HIGH (ref 70–99)
Potassium: 3.3 mEq/L — ABNORMAL LOW (ref 3.5–5.1)
Potassium: 4.2 mEq/L (ref 3.5–5.1)
Sodium: 136 mEq/L (ref 135–145)
Sodium: 139 mEq/L (ref 135–145)
Total Bilirubin: 0.3 mg/dL (ref 0.3–1.2)
Total Bilirubin: 0.3 mg/dL (ref 0.3–1.2)
Total Protein: 6.4 g/dL (ref 6.0–8.3)

## 2011-03-07 LAB — BLOOD GAS, ARTERIAL
Drawn by: 103701
TCO2: 23.3 mmol/L (ref 0–100)
pCO2 arterial: 44.4 mmHg (ref 35.0–45.0)
pH, Arterial: 7.369 (ref 7.350–7.400)

## 2011-03-07 LAB — PROTEIN ELECTROPHORESIS, SERUM
Alpha-2-Globulin: 12.4 % — ABNORMAL HIGH (ref 7.1–11.8)
Beta 2: 5.5 % (ref 3.2–6.5)
Gamma Globulin: 14 % (ref 11.1–18.8)
M-Spike, %: NOT DETECTED g/dL

## 2011-03-07 LAB — UIFE/LIGHT CHAINS/TP QN, 24-HR UR
Beta, Urine: DETECTED — AB
Free Lambda Lt Chains,Ur: 1.49 mg/dL — ABNORMAL HIGH (ref 0.08–1.01)
Total Protein, Urine: 19.5 mg/dL

## 2011-03-07 LAB — CARDIAC PANEL(CRET KIN+CKTOT+MB+TROPI)
CK, MB: 2.6 ng/mL (ref 0.3–4.0)
Relative Index: 2.6 — ABNORMAL HIGH (ref 0.0–2.5)
Total CK: 82 U/L (ref 7–177)
Total CK: 98 U/L (ref 7–177)

## 2011-03-07 LAB — EOSINOPHIL COUNT: Eosinophils Absolute: 0.1 10*3/uL (ref 0.0–0.7)

## 2011-03-07 LAB — CBC
HCT: 31.7 % — ABNORMAL LOW (ref 36.0–46.0)
HCT: 33.2 % — ABNORMAL LOW (ref 36.0–46.0)
Hemoglobin: 10.1 g/dL — ABNORMAL LOW (ref 12.0–15.0)
Platelets: 184 10*3/uL (ref 150–400)
RBC: 3.85 MIL/uL — ABNORMAL LOW (ref 3.87–5.11)
RDW: 14.7 % (ref 11.5–15.5)
WBC: 5.9 10*3/uL (ref 4.0–10.5)

## 2011-03-07 LAB — URINALYSIS, ROUTINE W REFLEX MICROSCOPIC
Glucose, UA: NEGATIVE mg/dL
Hgb urine dipstick: NEGATIVE
Specific Gravity, Urine: 1.024 (ref 1.005–1.030)
pH: 5 (ref 5.0–8.0)

## 2011-03-07 LAB — DIFFERENTIAL
Basophils Absolute: 0 10*3/uL (ref 0.0–0.1)
Basophils Relative: 0 % (ref 0–1)
Eosinophils Relative: 2 % (ref 0–5)
Monocytes Absolute: 0.7 10*3/uL (ref 0.1–1.0)

## 2011-03-07 LAB — SODIUM, URINE, RANDOM: Sodium, Ur: 74 mEq/L

## 2011-03-07 LAB — TSH: TSH: 0.311 u[IU]/mL — ABNORMAL LOW (ref 0.350–4.500)

## 2011-03-07 LAB — FOLATE: Folate: 15.4 ng/mL

## 2011-03-07 LAB — GLUCOSE, CAPILLARY

## 2011-03-08 LAB — DIFFERENTIAL
Basophils Absolute: 0 10*3/uL (ref 0.0–0.1)
Lymphs Abs: 1.9 10*3/uL (ref 0.7–4.0)
Monocytes Absolute: 0.6 10*3/uL (ref 0.1–1.0)

## 2011-03-08 LAB — BASIC METABOLIC PANEL
BUN: 24 mg/dL — ABNORMAL HIGH (ref 6–23)
Chloride: 100 mEq/L (ref 96–112)
Glucose, Bld: 155 mg/dL — ABNORMAL HIGH (ref 70–99)
Potassium: 3.7 mEq/L (ref 3.5–5.1)

## 2011-03-08 LAB — CBC
HCT: 41.8 % (ref 36.0–46.0)
MCHC: 32.3 g/dL (ref 30.0–36.0)
MCV: 87.9 fL (ref 78.0–100.0)
Platelets: 157 10*3/uL (ref 150–400)
RDW: 14.2 % (ref 11.5–15.5)

## 2011-03-12 LAB — DIFFERENTIAL
Basophils Relative: 1 % (ref 0–1)
Lymphs Abs: 0.8 10*3/uL (ref 0.7–4.0)
Monocytes Absolute: 0.4 10*3/uL (ref 0.1–1.0)
Monocytes Relative: 7 % (ref 3–12)
Neutro Abs: 5.1 10*3/uL (ref 1.7–7.7)

## 2011-03-12 LAB — CBC
Platelets: 192 10*3/uL (ref 150–400)
RDW: 14 % (ref 11.5–15.5)

## 2011-03-12 LAB — COMPREHENSIVE METABOLIC PANEL
ALT: 23 U/L (ref 0–35)
Albumin: 3.4 g/dL — ABNORMAL LOW (ref 3.5–5.2)
Alkaline Phosphatase: 62 U/L (ref 39–117)
BUN: 18 mg/dL (ref 6–23)
Calcium: 8.4 mg/dL (ref 8.4–10.5)
Potassium: 4.4 mEq/L (ref 3.5–5.1)
Sodium: 141 mEq/L (ref 135–145)
Total Protein: 6 g/dL (ref 6.0–8.3)

## 2011-04-18 NOTE — Discharge Summary (Signed)
Daisy [name], Daisy [name]               ACCOUNT NO.:  [id number]   MEDICAL RECORD NO.:  [id number]          PATIENT TYPE:  INP   LOCATION:  1316                         FACILITY:  [facility]   PHYSICIAN:  Daisy Harvest, MD    DATE OF BIRTH:  1951/05/23   DATE OF ADMISSION:  02/24/2008  DATE OF DISCHARGE:  03/03/2008                               DISCHARGE SUMMARY   PRIMARY CARE PHYSICIAN:  Dr. Abigail [name].   DISCHARGE DIAGNOSIS:  1. Right lower extremity cellulitis.  2. Generalized idiopathic edema.  3. Hypertension.  4. Mixed migraines.  5. Pituitary dysfunction.  6. Community acquired methicillin-resistant Staphylococcus aureus      scalp infection.  7. Hypothyroidism.  8. Gastroesophageal reflux disease.  9. Pseudo-wheeze.  10.Obesity.  11.History of occipital neuralgia.   DISCHARGE MEDICATIONS:  1. Doryx 100 mg p.o. b.i.d. x3 days.  2. Lisinopril 10 mg p.o. daily  3. Ensure 1 can t.i.d.  4. OxyContin 40 mg p.o. b.i.d.  5. Morphine 60 mg p.o. q.4h p.r.n.  6. Acyclovir 400 mg daily.  7. Osteo support 1 pill 3 times daily.  8. Motrin 200 mg p.o. q.4 to 6 hours p.r.n.  9. Clonazepam 1 tablet p.o. t.i.d.  10.Vitamin B1 100 mg p.o. daily.  11.Benadryl 25 mg p.o. q.6h p.r.n.  12.Omeprazole 40 mg p.o. daily.  13.Prednisone 5 mg in the morning 2.5 mg in the evening.  14.Symbicort 2 puffs b.i.d.  15.Maxair p.r.n.  16.Vistaril 60 mg p.o. q.6h p.r.n.  17.Soma 350 mg p.o. q.8h p.r.n.  18.Synthroid 75 mcg p.o. daily.  19.Fish oil p.o. daily.   DISPOSITION:  On followup the patient will be discharged home to follow  up with PCP in the next 1 to 2 weeks.  On followup, the patient's right  lower extremity cellulitis needs to be reassessed.  The patient's blood  pressure needs to be reassessed and, if needed, the lisinopril could be  increased.  A basic metabolic profile needs to be checked to follow up  on the patient's electrolytes and renal function.  The patient will also  be  discharged home on a low sodium diet.   PROCEDURES DONE:  A PICC line was placed on February 25, 2008.   CONSULTATIONS:  A renal consult was done per patient request.  The  patient was seen by Dr. Britt [name] on March 02, 2008.   BRIEF ADMISSION HISTORY AND PHYSICAL:  [name] is a 60-year-  old white female with past medical history of mixed migraines, community-  acquired MRSA infections, hypothyroidism, and a possible history of  pituitary dysfunction who was sent over by PCP as a direct admission  because the patient had failed outpatient therapy for cellulitis of the  right lower extremity, for which the patient had previously been on  Keflex.  The patient's suffered the episode about a week ago.  She had  been on Keflex for almost a week and the redness and swelling had  worsened.  Dr. Abigail [name] contacted the admitting physician and felt it was  best given the patient's prior history of MRSA infection to treat her  with IV antibiotics.  In addition to this history, the patient also had  problems with a severely generalized edema, the cause of which has not  been yet found, and the patient was scheduled an appointment in a week  to be seen by Dr. Eliott [name] of [facility].  The patient  then presented to the floor.  She was complaining of some bilateral leg  pain and generalized swelling.  The patient denied headaches or visual  changes.  No dysphagia, no chest pain, palpitations or shortness of  breath, wheezing, or cough.  The patient did have some abdominal  discomfort secondary to generalized swelling, but nothing immediately  acute and no hematuria or dysuria, constipation, diarrhea.  No focal  extremity numbness, weakness or pain, or otherwise described above.   PHYSICAL EXAM:  Per admission physician.  VITAL SIGNS:  Temperature 98.4, pulse of 103, blood pressure 124/82,  respiratory rate 24, satting 94% on room air.  GENERAL:  The patient was alert and oriented  x3.  HEENT:  Normocephalic, atraumatic.  The patient did have a number of  scalp scratches which looked to be somewhat fresh and open sores.  Mucous membranes slightly dry.  Neck was supple.  No lymphadenopathy.  No carotid bruits.  CARDIOVASCULAR:  Regular rate and rhythm.  Normal S1-S2.  RESPIRATORY:  Decreased breath sounds throughout secondary to body  habitus abdomen was obese, soft, nontender, nondistended.  Generalized  and nonspecific tenderness.  EXTREMITIES:  No clubbing or cyanosis.  The patient had a bowel 1+  pitting edema in her hands and 2 to 3 from the knee down, going to her  feet.  Right lower extremity from the anterior tibial area post knee  down to the floor was diffusely erythematous, covering the most anterior  aspect of her leg.  Some tenderness as well.   LABS:  Ordered and were pending day of discharge.  At time of admission,  lower extremity Dopplers were negative and a D-dimer was negative per  PCP's office.   HOSPITAL COURSE:  1. Right lower extremity cellulitis.  The patient was admitted.  She      had failed outpatient treatment with Keflex.  The patient was      started on Doxycycline IV as well as Avelox IV to cover for gram      negatives as well as a community acquired methicillin-resistant      Staphylococcus aureus.  A CBC was checked, blood cultures were      obtained which came back negative.  The patient's right lower      extremity was elevated during the hospitalization.  The patient was      monitored throughout the hospitalization and the patient's lower      extremity cellulitis continued to improve during the      hospitalization.  The patient's erythema improved, warmth improved,      tenderness to palpation improved, and the swelling also improved.      The patient was then switched to oral doxycycline, which she      tolerated, and the patient will be discharged home to complete a 10-      day course of doxycycline for right lower  extremity cellulitis.      The patient will be discharged in stable and improved condition to      follow up with PCP.  2. Generalized idiopathic edema.  The patient had had a work up per      cardiology as  an outpatient prior to admission.  The patient was      scheduled to be seen by Dr. Eliott [name] of [facility]      for further workup.  CMET was obtained during the hospitalization      and was negative for any hepatic disease.  A urinalysis was      obtained, which was bland and negative for any proteinuria.  The      patient's albumin was at 3.3.  The patient was started on Ensure.      Per patient's request a renal consult was obtained.  The patient      was seen in consultation per Dr. Lowell Guitar on March 02, 2008 and it      was per Dr. Roanna Banning workup, there was no evidence of renal disease      and no need for any further followup in terms of renal disease.  It      was recommended that the patient go on a low-sodium diet and, as      such, the patient will be discharged on a low sodium diet.  This      will be followed up per PCP.  3. Hypertension.  The patient's blood pressure was elevated during the      hospitalization.  The patient was then restarted on lisinopril 10      mg daily.  This will need to be followed up per PCP and titrated      accordingly.  The rest of the patient's chronic medical issues were      stable throughout the hospitalization and the patient will be      discharged in stable and improved condition.   On the day of discharge, vital signs temperature 98.3, blood pressure  156/85, respiratory rate 18, pulse of 87, satting 93% on room air.   DISCHARGE LABS:  Sodium 135, potassium 4.8, chloride 103, bicarb 28, BUN  30, creatinine 1.13, glucose 113, and a calcium of 8.1.  Blood cultures  were negative x2.   It was a pleasure taking care of [name].      Daisy Harvest, MD  Electronically Signed     DT/MEDQ  D:  03/03/2008   T:  03/03/2008  Job:  [id number]   cc:   [name]. Daisy [name], M.D.  Fax: [phone]

## 2011-04-18 NOTE — Discharge Summary (Signed)
Daisy [name], Daisy [name]               ACCOUNT NO.:  [id number]   MEDICAL RECORD NO.:  [id number]          PATIENT TYPE:  INP   LOCATION:  1441                         [facility]ACILITY:  [facility]   PHYSICIAN:  Hollice Espy, M.D.DATE OF BIRTH:  12-06-1950   DATE OF ADMISSION:  05/14/2008  DATE OF DISCHARGE:  05/21/2008                               DISCHARGE SUMMARY   PCP:  Dr. Rolm Bookbinder. Thacker.   DISCHARGE DIAGNOSES:  1. Tuberculosis rule out, patient with questionably positive PPD, but      negative acid-fast bacilli sputum.  2. Migraine headaches.  3. Scalp wound secondary to constant scratching of scalp, preliminary      negative for infection.  4. History of methicillin-resistant Staphylococcus aureus leg ulcers.  5. History of idiopathic peripheral edema.  6. Hypothyroidism.  7. History of pseudoasthma.  8. History of pituitary insufficiency.   DISCHARGE MEDICATIONS:  1. The patient will continue all of her previous medications.  She      will decrease pre down to 5 mg p.o. daily.  The rest of her      medications, she will continue as follows.  2. Vistaril 50 mg p.o. q.4 hours p.r.n.  3. Benadryl 100 mg p.o. q.4 hours p.r.n.  4. Aspirin 325 two tabs p.o. 4 times a day p.r.n.  5. Vitamin B1 250 mg p.o. daily.  6. Symbicort 80/4.5 mcg 2 puffs inhaled twice a day now with a spacer  7. Calcium B6 100 mg p.o. b.i.d.  8. Soma 350 p.o. t.i.d.  9. Clonazepam 1 mg p.o. t.i.d.  10.Vitamin D 50,000 units monthly.  11.Synthroid 75 mcg daily.  12.Lisinopril 10 p.o. daily.  This medication is to be stopped.  13.MSIR 2 tabs p.o. q.4 hours p.r.n.  14.Omega 3 fatty acids p.o. b.i.d.  15.Prilosec 20 p.o. daily.  16.OxyContin 40 mg p.o. b.i.d.  17.Prednisone 5 p.o. daily.   The patient is discharged on a low sodium diet.  She will continue  inspirometer daily p.r.n.  Her activities will be bedrest.   HOSPITAL COURSE:  The patient is a 60 year old white female who was  admitted for shortness  of breath and wheezing.  Her O2 sats were noted  to initially be 88% on room air.  She was started on Avelox.  She had  bronchospasm report secondary to Advair and albuterol.  She was put on  Solu-Medrol nebs and continued to be followed.  Her bilateral lower  extremity Dopplers were negative for DVT.  Critical care medicine was  consulted and it was felt that she had pseudoasthma.  No signs of true  hyper-airway reactive disease.  They recommended that she stop her  lisinopril medications.  The rest of her medications, they felt, she  could continue.  Over the next few days she continued improve.  There  was a report of an episode of hemoptysis, and it was felt in the past  that she had a positive PPD and was supposed to be on isoniazid for an  extended period of time, but she only took it for 3 months, being unable  to  tolerate it.  The patient underwent a high-res CT, which showed  interstitial disease of unclear significance in the upper fields.  Discussion with infectious disease, who was consulted, and they  recommend the patient could not be completely ruled out for TB, and the  recommend starting her on acid-fast bacilli clearance and putting her in  respiratory isolation.  The patient had initial sputum, the first 2 were  negative.  Her third sputum is still pending, but after discussion with  ID, they felt, in review of the literature, that the patient's first 2  smears are negative, the chances of having a third smear which is  positive are insignificant.  It is felt that she is cleared from a  standpoint for not having TB after her first 2 smears were negative.  The patient remained otherwise stable.  A followup chest x-ray done on  May 21, 2008 showed no evidence of any acute cardiopulmonary disease.  She was breathing comfortably.  In regards to her migraine, she had some  episodes of migraine, which she felt were secondary to the adjustment of  her pain medications, and she  wished to be put on different doses and  different timeframe than at home.  She had some mild migraine at the  time of discharge, but she herself felt that she was stable and would  like to go home.  She otherwise is doing well.  The rest of her medical  issues were stable during this hospitalization.   PLAN:  The patient to be discharged home.   DISCHARGE DIET:  Again, will be a low sodium diet.   The patient will follow up with her PCP, Dr. [name], on Monday, June  22.   The patient was complaining of some scalp abscesses.  Wound cultures  were taken, all of which were preliminary negative.      Hollice Espy, M.D.  Electronically Signed     SKK/MEDQ  D:  05/21/2008  T:  05/21/2008  Job:  [id number]   cc:   [name]. [name], M.D.  Fax: [phone]

## 2011-04-18 NOTE — Assessment & Plan Note (Signed)
[facility]                             PULMONARY OFFICE NOTE   NAME:Hackbart, SUNJAI LEVANDOSKI                      MRN:          [id number]  DATE:04/23/2007                            DOB:          1951/09/15    HISTORY OF PRESENT ILLNESS:  The patient is a 60 year old white female  patient of Dr. Thurston Hole who has a known history of upper airway  instability with persistent cough, wheezing, and shortness of breath.  Patient returns today for a one month followup.  Last visit patient was  having some improvement with reflux and cough prevention.  Patient was  using Tramadol every 4 hours needed for cough control and Protonix once  daily.  The patient was recommended to change Tramadol over to an as  needed basis.  The patient has stopped taking her Tramadol, however once  she stopped taking her Tramadol cough and wheezing seemed to return.  The patient has brought all of her medications in to review.  The  patient has a very complicated medication list.  She has an extensive  history of migraine headache and is followed closely by Neurology and is  on a extensive pain regimen that includes oxycodone, clonazepam,  morphine, and Vistaril.   PAST MEDICAL HISTORY:  Reviewed.   CURRENT MEDICATIONS:  Reviewed.   PHYSICAL EXAMINATION:  The patient is a female in no acute distress, She  is afebrile with stable vital signs.  O2 saturation is 94% on room air.  HEENT:  Unremarkable.  NECK:  Supple without adenopathy.  No JVD.  LUNGS:  Sounds reveal coarse breath sounds without any wheezing or  crackles and some slight upper airway pseudo-wheezing.  CARDIAC:  Regular rate.  ABDOMEN:  Soft and nontender.  EXTREMITIES:  Warm without any edema.   IMPRESSION AND PLAN:  1. Upper airway instability with persistent cough and wheezing.  The      patient will continue on Symbicort 80/4.5 twice daily 2 puffs twice      daily, rinse very well  afterwards.  Will add in Delsym 2  teaspoons      every 12 hours and may use Tramadol as needed for breakthrough      coughing.  The patient will return here in 2-3 weeks with a full      set of pulmonary function tests and we will determine whether she      needs to continue on Symbicort or not at that time.  2. Complex medication regimen, patient medications reviewed in detail.      Patient education is      provided.  A computerized medication calendar was completed for      this patient and patient is aware to bring this back to each and      every visit.      Rubye Oaks, NP  Electronically Signed      Charlaine Dalton. Sherene Sires, MD, [facility]  Electronically Signed   TP/MedQ  DD: 04/23/2007  DT: 04/23/2007  Job #: [id number]

## 2011-04-18 NOTE — H&P (Signed)
NAMEJIAYI, LENGACHER               ACCOUNT NO.:  [id number]   MEDICAL RECORD NO.:  [id number]          PATIENT TYPE:  INP   LOCATION:  1316                         FACILITY:  [facility]   PHYSICIAN:  Hollice Espy, M.D.DATE OF BIRTH:  06-13-1951   DATE OF ADMISSION:  02/24/2008  DATE OF DISCHARGE:                              HISTORY & PHYSICAL   PRIMARY CARE PHYSICIAN:  [name]. [name], M.D.   NEUROLOGIST:  Marlan Palau, M.D.   CHIEF COMPLAINT:  Left leg cellulitis with now improvement.   HISTORY OF PRESENT ILLNESS:  The patient is a 60 year old white female  with past medical history of mixed migraines, community-acquired MRSA  scalp infections, hypothyroidism, and possible history of pituitary  dysfunction who was sent over by her primary care physician as a direct  admission because the patient had failed outpatient therapy for a  cellulitis of the right lower extremity for which the patient had  previously been on Keflex. The patient suffered episode about a week  ago.  She has been on Keflex now for almost a week, and the redness and  swelling have gotten worse. Dr. [name] contacted me and felt it was  best, given a previous history MRSA infections, to treat her with IV  antibiotics.  In addition to this history, the patient has also had  problems with a severely generalized edema.  The cause of this has not  yet been found, and the patient has a scheduled appointment in 1 week  with Dr. Eliott Nine of [facility]. The patient presented to  the floor. She was complaining of some bilateral leg pain and  generalized swelling.  She denies any headaches or vision changes. No  dysphagia.  No chest pain, palpitations or shortness of breath, wheezing  or coughing.  She does have some abdominal discomfort secondary to  generalized swelling but nothing immediately acute. No hematuria or  dysuria, constipation, diarrhea. No focal extremity numbness, weakness,  or pain  other than described above.   REVIEW OF SYSTEMS:  Otherwise negative  Otherwise, her generalized  swelling she says has been going on now for at least a few months if not  longer. She also states that she has had diagnosis of generalized  conditions and pituitary symptoms at [facility], but it is likely  had those symptoms for several years.   PAST MEDICAL HISTORY:  1. Mixed migraines followed by neurology.  2. Pituitary dysfunction.  3. Community-acquired MRSA scalp infections.  4. Hypothyroidism.  5. GERD.  6. Pseudo-wheeze.  7. Obesity.  8. History of occipital neuralgia.   MEDICATIONS:  1. OxyContin, 40 b.i.d.  2. Morphine 60 mg p.o. q.4 h.  3. Cephalexin 500 p.o. b.i.d. for the last few days.  4. Acyclovir 400 daily.  5. Osteo Support 1 pill three times a day.  6. Motrin 200 p.r.n.  7. Clonazepam 1 tablet p.o. 3 times a day.  8. Vitamin B1 100 mg p.o. daily.  9. Benadryl 25 p.r.n.  10.Omeprazole  40 p.o. daily.  11.Prednisone 5 mg in the morning, 2.5 mg in the evening.  12 . Symbicort 2 puffs twice a day.  1. Maxair p.r.n.  2. Vistaril 60 mg p.o. p.r.n.  15 . Soma 350 p.o. q.8 h p.r.n.  1. Synthroid 75 mcg daily.  2. Fish oil.   ALLERGIES:  She has a number of reported drug allergies, but it is  difficult to say which ones are actually true drug allergies.  It looks  to be that the ones that appear to be true drug allergies are IV  CONTRAST DYE, THORAZINE, STADOL, and BENZODIAZEPINES.   SOCIAL HISTORY:  She denies any tobacco, alcohol or drug use.   FAMILY HISTORY:  Noncontributory.   PHYSICAL EXAMINATION:  VITAL SIGNS:  On admission, temperature 98.4,  heart rate 103, blood pressure 124/82, respirations 24, O2 saturation  94% on room air.  GENERAL:  She is alert and oriented x3.  HEENT:  Normocephalic. She has a number of scalp scratches which look to  be somewhat fresh and open sores.  Mucous membranes slightly dry.  NECK:  She has no carotid bruits.   HEART:  Regular rate and rhythm.  S1-S2.  LUNGS:  Decreased breath sounds throughout secondary to body habitus.  ABDOMEN:  Soft, obese. Generalized nonspecific tenderness.  EXTREMITIES:  Show no clubbing or cyanosis.  She has about 1+ pitting  edema in her hands and 2 to 3+ from the knees down and going into her  feet.  Her right lower extremity from the anterior tibial area post knee  down to her foot is diffusely erythematous, covering the most anterior  aspect of her leg.  There is some tenderness as well.   LABORATORY DATA:  I have ordered CBC, CMET, and other labs which are  currently pending.  Dr. [name] in the office ordered a D-dimer and  lower extremity Doppler which were negative.   ASSESSMENT AND PLAN:  1. Cellulitis.  She has failed outpatient Keflex IV, doxycycline,      Avelox for gram-negatives as well as community-acquired methicillin-      resistant Staphylococcus aureus.  Check CBC.  2. Generalized edema.  The patient has an appointment with Dr. Eliott Nine.      I told her that Dr. Eliott Nine may not be available in the hospital as      they have rotating doctors for consultations.  She said that if not      Dr. Eliott Nine, then she preferred to wait until her appointment.  We      will check in the morning when the office is open  In the meantime      will check a sed rate, urinalysis and albumin looking for      possibilities of protein nephropathy.  3. History of mixed migraines.  Continue her narcotics. She is on an      aggressive dose of medications, but will defer to her primary care      physician and possible pain management to sort this out.  4. Pulmonary. Will continue on p.r.n. inhalers.      Hollice Espy, M.D.  Electronically Signed     SKK/MEDQ  D:  02/24/2008  T:  02/24/2008  Job:  [id number]   cc:   [name]. [name], M.D.  Fax: [phone]   [facility] Eliott Nine, M.D.  Fax: [phone]   C. Lesia Sago, M.D.  Fax: [phone]

## 2011-04-18 NOTE — H&P (Signed)
Daisy [name], Daisy [name]               ACCOUNT NO.:  [id number]   MEDICAL RECORD NO.:  [id number]          PATIENT TYPE:  INP   LOCATION:  1516                         FACILITY:  [facility]   PHYSICIAN:  Michiel Cowboy, MDDATE OF BIRTH:  November 02, 1951   DATE OF ADMISSION:  05/14/2008  DATE OF DISCHARGE:                              HISTORY & PHYSICAL   CHIEF COMPLAINT:  Shortness of breath.   PRIMARY CARE Creig Landin:  [name]. [name], M.D.   Patient is a 60 year old female with multiple medical problems,  including a history of recurrent MRSA scalp lesions, history of  synovitis, generalized idiopathic edema, and chronic wheezing.  The  patient was seen yesterday by her ID specialist at [facility] and was feeling  well but on the way from the office, started to feel short of breath and  had difficulty catching her breath and ambulating, at which point she  went home.  At night, she continued to feel poorly, feeling overall  presyncopal and tired.  She presented to Dr. Recardo Evangelist office in the  morning.  A chest x-ray was performed, which showed possible left lower  lobe infiltrate.  The patient was directly admitted to [facility], and  [facility] called to admit the patient.   PAST MEDICAL HISTORY:  1. Mixed migraines, followed by neurology.  2. Pituitary dysfunction.  3. Hx of  MRSA scalp infection.  4. Hypothyroidism.  5. GERD.  6. Pseudowheeze.  7. Obesity.  8. History of occipital neuralgia for which she takes long-term      narcotics.   REVIEW OF SYSTEMS:  Significant for shortness of breath, wheezing, and  dyspnea on exertion.  No chest pain.  Frequent bowel movements.  Fevers  at home.   ALLERGIES:  She has anaphylaxis to CONTRAST DYE,  Reportedly has poor  tolerance of BENZODIAZEPINES but on chronic Klonopin.  REGLAN causes  palpitations.  Has poor tolerance of AMARYL.  STEROIDS cause weight  gain.  She reports not being able to take [facility] or EMERGE.  Has  delirium  with PHENERGAN.  GI bleed to [facility].   CURRENT MEDICATIONS:  1. Symbicort 2 puffs twice daily.  2. Calcium with magnesium oxide.  3. Calciferol 600 mg b.i.d.  4. Soma 350 3 tabs a day.  5. Klonopin 1 mg p.o. 3 times a day.  6. Vitamin D 50,000 units 1 cap monthly.  7. Synthroid 75 mcg p.o. daily.  8. Lisinopril 10 mg p.o. daily.  9. Morphine sulfate, patient states IR, 60 mg p.o. every 4 hours.  10.Fish oil 1 tablet by mouth 2 tabs a day.  11.Omeprazole 20 mg p.o. twice daily.  12.OxyContin 40 mg p.o. twice daily.  13.Prednisone 5 mg p.o. in the morning.  14.Vitamin B12 250 mg p.o. daily.  15.Aspirin 325 mg p.o. daily.  16.Benadryl 50 mg p.o. as needed for nausea and itching.  17.Vistaril 50 mg p.o. every 4 hours as needed for itching.   SOCIAL HISTORY:  Patient denied ever smoking or using drugs or alcohol.  Lives with her husband.   FAMILY HISTORY:  Noncontributory.   PHYSICAL  EXAMINATION:  VITALS:  Temperature 98.7, heart rate 93,  respirations 18, blood pressure 144/75.  Satting at 87% on room air, 93%  on 2 liters.  GENERAL:  This is an obese female in no acute distress, sitting down in  bed.  Head atraumatic.  Moist mucous membranes.  LUNGS:  Occasional wheezes bilaterally.  HEART:  Regular rate and rhythm.  No murmurs, rubs or gallops.  ABDOMEN:  Obese but soft.  LOWER EXTREMITIES:  There is a somewhat more swollen right and left leg.  NEUROLOGIC:  Otherwise intact.   LABS:  White blood cell count 10.7, hemoglobin 10.3, platelets 657.  D-  dimer 1.8.  Sodium 139, potassium 4.5, creatinine 0.9, albumin 3.   Chest x-ray shows left lower lobe infiltrate, per my review.   ASSESSMENT/PLAN:  This is a 60 year old female with multiple complaints,  appears generally with what seems to be a left lower lobe infiltrate and  some wheezing and thrombocytopenia.  1. Pneumonia:  The patient does have a history of scarring.  Will      obtain repeat chest x-ray and have  radiology review it to see if      there is true evidence of new pneumonia versus what we are seeing      is just chronic scarring.  For right now, we will cover for      community-acquired pneumonia with Avelox.  The patient does have a      history of MRSA infections.  We will need to cover more broadly      with his known problem with Avelox.  Will give incentive      spirometer.  Given that the patient has wheezing and reports a      history of asthma, although no documented history of asthma, we      will increase prednisone to 20 b.i.d., give albuterol p.r.n.,      continue Symbicort.  Will obtain blood and sputum culture.  Given      the patient has a chronic history of infections, will work up      causes of repeated generalized infection.  Such as, will send HIV      serology antibody level.  2. Pituitary insufficiency:  I will increase prednisone to 20 b.i.d.      Check TSH level.  3. Thrombocytopenia:  Will recheck and situate tube to rule out      clamping.  Will get blood smear to further evaluate.  If persists,      will have hematology/oncology consult.  Will send for [place], GIC      panel.  4. Elevated D-dimer in the setting of right lower extremity swelling,      which could be secondary to chronic cellulitis.  Nonetheless, will      get Dopplers, lower extremity and V/Q scan.  Patient has severe      allergies to IV      CONTRAST.  5. Chronic migraines:  Continue home regimen of pain meds to prevent      withdrawal.  6. Prophylaxis:  Protonix, SCDs p.r.n.      Michiel Cowboy, MD  Electronically Signed     AVD/MEDQ  D:  05/14/2008  T:  05/14/2008  Job:  [id number]   cc:   [name]. [name], M.D.  Fax: [phone]

## 2011-04-18 NOTE — Consult Note (Signed)
Daisy [name], Daisy [name]               ACCOUNT NO.:  [id number]   MEDICAL RECORD NO.:  [id number]          PATIENT TYPE:  INP   LOCATION:  1516                         FACILITY:  [facility]   PHYSICIAN:  Daisy Needle B. Sherene Sires, MD, FCCPDATE OF BIRTH:  11/12/1951   DATE OF CONSULTATION:  05/15/2008  DATE OF DISCHARGE:                                 CONSULTATION   REASON FOR CONSULTATION:  Asthma.   HISTORY:  A 60 year old white female, never smoker, who was previously  evaluated in our office over a year ago for pseudoasthma, and this had  resolved on her last evaluation on April 03, 2007, while maintained off  of ACE inhibitors and while treating rhinitis and reflux aggressively.  When I last saw her in April 2008, I recommended follow-up PFTs for  baseline purposes to consider eliminating Symbicort from her regimen  because I was not really sure she had asthma.  She did have a cough that  seemed somewhat tramadol dependent, but did not have typical asthma  features.   Subsequent to this, she says she left to go to [place] because her  sister was sick, and at some point in the last year was placed back on  ACE inhibitors, although she does not remember where or when.  In any  case, she comes in now with 1 month of persistent upper airway coughing,  wheezing and sensation of congestion with no significant mucus  production and was admitted with refractory wheezing by the  hospitalist service on May 14, 2008.  Lisinopril was stopped subsequent  to admission and has improved though minimally since that time.   The patient denies any lateralizing pleuritic pain, purulent nasal  discharge, fevers, chills, sweats, orthopnea, PND or significant leg  swelling.   PAST HISTORY:  1. Migraine headaches.  2. Pituitary dysfunction.  3. Recurrent MRSA infections.  4. Hypothyroidism.  5. GERD.  6. Obesity.   ALLERGIES:  INTERESTINGLY, ALTHOUGH SHE WAS TOLD SHE COULD NOT TAKE ACE  INHIBITORS, SHE DID  NOT LIST THIS IS AN ALLERGY.  SHE DID LIST MULTIPLE  MEDICATIONS, INCLUDING REGLAN AND STEROIDS.  PLEASE SEE H&P, WHICH WAS  REVIEWED.  SHE IS ALLERGIC TO CONTRAST DYE WITH ANAPHYLAXIS OF NOTE.   PRESENT MEDICATIONS:  1. Atrovent 0.5 q.8.  2. Avelox 400 q.24.  3. Doxycycline 100 mg b.i.d.  4. Ensure.  5. Klonopin.  6. Lovaza (fish oil).  7. Os-Cal.  8. OxyContin.  9. Pepcid.  10.Prednisone.  11.Protonix.  12.Soma.  13.Symbicort, Synthroid and Ventolin per nebulizer.  14.Aspirin and Benadryl p.r.n.   SOCIAL HISTORY:  She denies ever smoking.   FAMILY HISTORY:  Negative for respiratory diseases.   REVIEW OF SYSTEMS:  Taken in detail from the patient and also from the  H&P, and negative except as already outlined above.   PHYSICAL EXAMINATION:  GENERAL:  This is a depressed white female lying  motionless in bed with a hopeless/helpless affect and attitude.  She  does appear minimally cushingoid.  VITAL SIGNS:  She is afebrile with normal vital signs.  HEENT:  Unremarkable.  Pharynx clear.  LUNG:  Lung fields reveal classic pseudo wheeze with no actual wheezing.  CARDIOVASCULAR:  There is regular rate and rhythm without murmur, gallop  or rub.  ABDOMEN:  Soft, benign.  EXTREMITIES:  Warm without calf tenderness, cyanosis, clubbing or edema.   Chest x-ray was read as vascular congestion, but actually has very low  lung volumes and note her BNP is normal as is her by bicarb level.   IMPRESSION:  1. This patient has classic pseudoasthma has now been documented twice      while on angiotensin converting enzyme inhibitors.  I have asked      her to label herself ALLERGIC TO ACE INHIBITORS, and I feel that if      she can list in exquisite detail all the other medications that she      reports to be allergic and intolerant to that ACE inhibitors ought      to be included at the top of this list.  2. The record indicates that he has a history of rhinitis with a      negative  sinus CT scan performed on March 19, 2007.  However,      certainly postnasal drip syndrome can trigger upper airway      instability that is aggravated by ACE inhibitors.  3. Alternatively, she also carries a diagnosis of gastroesophageal      reflux disease.  I found that gastroesophageal reflux disease, plus      ACE inhibitors are synergistic in terms of destabilizing the upper      airway, and it may be this is the reason why she has so many asthma-      like symptoms.  4. I continue to feel that it is questionable whether she has asthma      at all.  I would consider first of all reducing the Symbicort dose      to the 80/4.5two puffs b.i.d., stopping all steroids, and then      tapering her off Symbicort as well to see if she flares.  If so, I      would certainly like to see her back in the office to be sure we      are dealing with true asthma here.   This patient is exceptionally difficult in terms of interview, being  evasive and not answering questions that were asked.  I suspect that  this problem is amplified multifold in the office setting, seeing  different physicians at different locations, and I have offered the  services of our nurse practitioner, if she will return to the office,  for full medication reconciliation purposes and to work with her to  shorten the list of medicine she takes sorting out maintenance vs prns,  which she struggles with mightlily. This is exactly where we stood last  year.  She saw our nurse practitioner, was given a medication calendar  that provided a user friendly unambiguous means of self-administration,  both  maintenance and p.r.n. medications, and promptly lost this and did not  return for follow-up.  Therefore, I do not believe there is much we can  do for her, but I certainly offered to try tp help her is she is willing  to put effort forward to work with [name].      Daisy [name]. Sherene Sires, MD, [facility]  Electronically Signed     MBW/MEDQ   D:  05/15/2008  T:  05/16/2008  Job:  [id number]   cc:   Daisy [name]  Daisy [name], M.D.  Fax: [phone]

## 2011-04-18 NOTE — Op Note (Signed)
Daisy [name], Daisy [name]               ACCOUNT NO.:  [id number]   MEDICAL RECORD NO.:  [id number]          PATIENT TYPE:  AMB   LOCATION:  DAY                          FACILITY:  [facility]   PHYSICIAN:  Adolph Pollack, M.D.DATE OF BIRTH:  1950/12/11   DATE OF PROCEDURE:  06/14/2009  DATE OF DISCHARGE:                               OPERATIVE REPORT   PREOPERATIVE DIAGNOSIS:  Left breast Paget's disease.   POSTOPERATIVE DIAGNOSIS:  Left breast Paget's disease.   PROCEDURE:  Left total mastectomy.   SURGEON:  Adolph Pollack, M.D.   ANESTHESIA:  General.   INDICATIONS:  Ms. Tidmore is a 60 year old female who has a past history  of invasive left breast cancer.  She underwent lumpectomy axillary lymph  node dissection and radiation and chemotherapy in the past.  She had an  abnormality of her nipple and that was biopsied and positive for Paget's  disease.  She now presents for left a mastectomy.  We discussed  procedure, risks and aftercare preoperatively.   TECHNIQUE:  She was seen in the holding area in the left chest/breast  marked with my initials.  She is then brought to the operating room,  placed supine on the operating table and a general anesthetic was  administered.  The left breast and chest wall area were sterilely  prepped and draped.  An elliptical incision was made into the skin and  the left breast.  Using electrocautery, flaps were created superiorly  dissecting the breast tissue free from the skin and dermis.  This was  done superiorly to the clavicle, medially to the sternum, inferiorly to  the rectus sheath, and laterally to latissimus muscle.  The tissue was  fairly dense with neovascularization noted.  The tissue was particularly  dense in the area of left side with a previous lumpectomy had been done.   After I had raised the cutaneous flaps in all directions, I then used  electrocautery to dissect the left breast off the underlying pectoralis  fascia and  muscle.  I then dissected it free from the latissimus muscle,  freeing up the breast tissue.  A fair amount of scar was encountered  laterally and inferiorly.  Once the breast was removed, I marked the  medial aspect of the skin with a suture and it was sent to pathology.   Following this I investigated the area for bleeding and bleeding areas  were identified and treated with electrocautery until hemostasis was  adequate.   I then copiously irrigated out the wound and noted no further bleeding.  The irrigation fluid was evacuated.  A small stab incision was made in  the inferolateral aspect of the inferior flap and a size 19 round Blake  drain was inserted  into the site and anchored to the skin with 3-0 nylon suture.  Following  this, a few Vicryl sutures were used to approximate the subcutaneous  tissue.  The skin was then closed with staples.  Sterile dressings were  applied.   She tolerated the procedure well without apparent complications and was  taken to recovery in satisfactory condition.  Adolph Pollack, M.D.  Electronically Signed     TJR/MEDQ  D:  06/14/2009  T:  06/14/2009  Job:  [id number]   cc:   [name]. [name], M.D.  Fax: [phone]   Lennis P. Darrold Span, M.D.  Fax: [phone]   [place]. Yetta Barre, M.D.  Fax: [phone]

## 2011-04-21 NOTE — Discharge Summary (Signed)
Daisy [name], SIBERT               ACCOUNT NO.:  [id number]   MEDICAL RECORD NO.:  [id number]          PATIENT TYPE:  INP   LOCATION:  3039                         FACILITY:  [facility]   PHYSICIAN:  Theone Stanley, MD   DATE OF BIRTH:  24-Jan-1951   DATE OF ADMISSION:  09/26/2004  DATE OF DISCHARGE:  10/19/2004                                 DISCHARGE SUMMARY   CONTINUATION:   HOSPITAL COURSE:  The patient was admitted with intractable migraines, and  she was started on IV DHE protocol.       AEJ/MEDQ  D:  12/05/2004  T:  12/05/2004  Job:  [id number]

## 2011-04-21 NOTE — H&P (Signed)
[name], [name]               ACCOUNT NO.:  [id number]   MEDICAL RECORD NO.:  [id number]          PATIENT TYPE:  INP   LOCATION:  3039                         FACILITY:  [facility]   PHYSICIAN:  Hollice Espy, M.D.DATE OF BIRTH:  [birth year]   DATE OF ADMISSION:  09/26/2004  DATE OF DISCHARGE:                                HISTORY & PHYSICAL   ATTENDING PHYSICIAN:  Corinna L. Lendell Caprice, M.D.   PRIMARY CARE PHYSICIAN:  Dr. Janey Greaser.   /CONSULTATION/>  Deanna Artis. Sharene Skeans, M.D., neurology.   CHIEF COMPLAINT:  Intractable migraines.   HISTORY OF PRESENT ILLNESS:  The patient is a 60 year old white female with  a long past medical history of continued intractable migraine, multiple back  surgeries as well as hypertension who presented after she continued to have  severe problems with migraines.  She tells me that she has had multiple back  surgeries and they most recently put in what appeared to be a spinal  stimulator.  This was complicated and had to have a repeat surgery for  repeat positioning in his back in February of 2005.  Since then, the patient  tells me that she has had a migraine of some degree to another but lately it  has gotten so bad that she could not take it anymore.  She says she has not  been migraine-free since February.  In discussion, she went to see Dr.  Janey Greaser and in discussion with Dr. Sharene Skeans from neurology, felt best that  patient come in to be admitted for being placed on IV DHE protocol which  will be listed below in the assessment and plan.  The patient was brought in  as a direct admission.  She is awaiting a PICC line currently but she says  that she is feeling okay, still complaining of severe pain which she  describes as a bitemporal pounding as well as a bandlike around her entire  head with tension in the occipital area as well as some numbness radiating  down her left shoulder, arm and hand.  These are all continued findings  which have  been ongoing for years.  The only new associated symptoms with  her migraines which she has not previously had is pain, sharp and stabbing,  going down the right side of the back of her neck.  This has been new since  February.  She denies any other complaints.  She does complain of blurry  vision again also ongoing and chronic.  She denies any dysphagia.  She  denies any chest pain or palpitations.  She denies any shortness of breath,  wheezing or coughing.  She denies any abdominal pain, hematuria, dysuria,  diarrhea or focal extremity pain.  She tells me that she does have  occasional constipation but being on the OxyContin this does often.   PAST MEDICAL HISTORY:  1.  Severe intractable migraines followed extensively by Dr. Sharene Skeans.      Please see previous dictations.  She has also been to multiple headache      centers.  2.  She has a history of multiple back  surgeries as well as discectomies.  3.  History of hypertension.  4.  GERD.   MEDICATIONS:  1.  The patient was recently discontinued a few weeks ago from biogenic      estrogen.  2.  She is currently on Lisinopril 10 mg daily.  3.  Nexium 40 b.i.d.  4.  OxyContin 40 b.i.d.   ALLERGIES:  No known drug allergies, although she says she is sensitive to  quite a few medications including PHENERGAN which has made her confused on  the last hospital admission.   SOCIAL HISTORY:  She denies any tobacco or alcohol use.   FAMILY HISTORY:  Extensive for migraines.  Her mom died of a CVA.  Dad died  of a CAD.   PHYSICAL EXAMINATION:  GENERAL APPEARANCE:  The patient is alert and  oriented x3, in no apparent distress. She is having photophobia.  VITAL SIGNS:  Admission temperature 98.0, pulse 97, respiratory rate 20,  blood pressure 120/85, O2 saturation 93% on room air.  NECK:  She has no carotid bruits.  LUNGS:  Clear to auscultation bilaterally.  CARDIOVASCULAR:  Regular rate and rhythm, S1 and S2.  ABDOMEN:  Soft,  nontender and nondistended with positive bowel sounds.  EXTREMITIES:  No clubbing or cyanosis.  She does have some 1+ pitting edema  bilaterally.  NEUROLOGIC:  She has no focal extremity deficits.  She was generalized weak  but she does not have focal weakness in one leg or the other.   LABORATORY DATA:  None.   ASSESSMENT/PLAN:  1.  Intractable migraines.  The patient has had multiple CT scans so at this      time I did not order any repeat radiology studies and her symptoms seem      to be new.  I believe one of the reasons possibly that she may be      resistant to other people with history of relapsing migraines may be      associated with her multiple back surgeries and some component of pain      may be associated.  Will go with Dr. Darl Householder recommendations to put      patient on an IV dihydroergotamine protocol.  It is recommended as      follows:  The patient will have a PICC line placed as she has problems      with IV access, then she will have IV Zofran given followed 10 minutes      later by 45 mg of IV DHE 1 mg/mL IV push.  This will be given every      eight hours for a total of nine doses.  In addition, Dr. Sharene Skeans will      follow as well.  As the patient is waiting for PICC line currently, will      give her      some IM Vistaril for her nausea.  2.  Hypertension.  Will continue Lisinopril.  3.  Gastroesophageal reflux disease.  Will continue her Nexium.      Send   SKK/MEDQ  D:  09/26/2004  T:  09/26/2004  Job:  [id number]   cc:   Corinna L. Lendell Caprice, MD   Dr. Lyanne Co H. Sharene Skeans, M.D.  [address]  [address]  [place]  [place] [address]  Fax: [phone]

## 2011-04-21 NOTE — Assessment & Plan Note (Signed)
[name]                             PULMONARY OFFICE NOTE   NAME:[name], [name]                      MRN:          [id number]  DATE:03/19/2007                            DOB:          1951/06/02    PULMONARY/EXTENDED FOLLOWUP OFFICE VISIT:   HISTORY:  A 60 year old white female whom I had the privilege of seeing  two years ago for evaluation of coughing that I thought was upper  airways in nature and exacerbated by Ace-inhibitors.  She was having a  bad day when I last saw her on April 6 with perfectly normal lung  function tests and prominent pseudo wheezing on exam.  I felt that this  was either functional or reflux related.  Recommended maximum treatment  directed at reflux and referral to the [facility] at [facility].  Instead  she ended up seeing an allergist who said it is an allergy and an ENT  physician who said It is not reflux and now comes back to me  frustrated that in addition to the problem that never cleared up 2 years  ago, she is much worse for 7 weeks.  The only symptom that she has is  different is increasing symptoms of watery rhinitis.  She does see  improvement after Maxair and is waking up at night with dyspnea, but  more concerned also that she is losing her voice again.   She was seen by Dr. Shelle Iron on March 15, 2007 and felt to have resolving  asthma with rhinitis and possible underlying sinusitis and therefore he  recommended CT of the sinuses which she brings back today and is  completely normal.   Medication reviewed in detail and note that she is now on Pulmicort  maintenance.   On physical examination she is a frustrated appearing ambulatory white  female who appears with somewhat of a helpless and hopeless affect and  attitude.  She had stable vital signs.  HEENT:  Is remarkable for mild non-specific turbinate edema.  Oropharynx  is clear.  No evidence of excessive post nasal drainage.  NECK:  Supple without  cervical adenopathy, tenderness.  Trachea is  midline.  Negative lung fields reveal a few pops and squeaks on inspiration,  minimal wheeze on expiration.  More pseudo, again more upper airway then  lower airway in nature.  There is a regular rhythm without murmur, gallop, or rub.  ABDOMEN:  Soft, benign.  EXTREMITIES:  Without calf tenderness, cyanosis or clubbing.   IMPRESSION:  Again her symptoms seem disproportionate to objective  evidence of airways disease. In favor of true asthma is the fact that  she does seem somewhat better from Maxair and also has spontaneous  waking in the middle of the night.  Against asthma is the fact that she  has not responded to Pulmicort.  One possibility is that the large  particles delivered with PPI Pulmicort are irritating her airway.  I  therefore spent extra time teaching her how to use Symbicort and asked  her to try the 80/4.5 two puffs b.i.d.  empirically just for the next  2  weeks and then return here to report back whether she is improving or  not.  I am going to ask her to use Protonix 40 mg tablets each and every  day 30 minutes before breakfast and suppress excess cough with Tramadol  50 mg  every 4 hours and use Maxair 2 puffs every 4 hours p.r.n.  dyspnea.   I spent extra time with this patient teaching her how to use MDI and  also trying to help her understand the full intention of a therapeutic  trial. In this particular instance, I do not believe doing additional  tests (except for perhaps a methacholine challenge test) would be of any  benefit here and instead we will offer to follow her back closely to be  sure  to document whether or not in fact she does respond.  If as before  we find that the problem is mostly upper airway, I believe the best  choice for her would be referred to the [facility] at [facility] B. Sherene Sires, MD, [facility]  Electronically Signed    MBW/MedQ  DD: 03/19/2007  DT: 03/19/2007  Job #: [id number]    cc:   [name]. [name], M.D.

## 2011-04-21 NOTE — Discharge Summary (Signed)
NAMEJOLITA, Daisy [name]                         ACCOUNT NO.:  [id number]   MEDICAL RECORD NO.:  [id number]                   PATIENT TYPE:  INP   LOCATION:  3027                                 FACILITY:  [facility]   PHYSICIAN:  Daisy [name], M.D.          DATE OF BIRTH:  01/17/1951   DATE OF ADMISSION:  08/21/2002  DATE OF DISCHARGE:  09/04/2002                                 DISCHARGE SUMMARY   ADMISSION DIAGNOSIS:  Intractable migraines.   DISCHARGE DIAGNOSIS:  Intractable migraines.   CONDITION ON DISCHARGE:  Improved.   ADDITIONAL DIAGNOSES:  1. Stable hypothyroidism.  2. Transient, unexplained elevation of transaminase, mild, resolved to     normal.  3. Transient red rash for one-half hour, possibly drug reaction, resolved.   LABORATORY DATA:  Comprehensive metabolic panel on 09/04/2002, day before  discharge, entirely normal with the exception of an albumin which was  slightly low at 2.9 and ALT which was 52. At its peak, it was 122.  The AST  is 21; at its peak, it was 100.  Completely normal CBC on 09/04/2002.  Lithium level was 0.97 on 09/04/2002; TSH was 1.495.   The patient had a PICC placement in the right arm on admission for  medication use.   EKG showed normal sinus rhythm, normal EKG.   DISCHARGE MEDICATIONS:  1. Vioxx 50 mg one a day.  2. Lithobid 300 mg b.i.d.  3. Klonopin 0.5 mg q.h.s. p.r.n.  4. Resume Synthroid.  5. Resume Nexium.   She should not take Celebrex or Primidone at this point.   PAIN MANAGEMENT:  She should take the Lithobid regularly and take the Vioxx  as needed.  Take the Klonopin for sleep if needed.   ACTIVITY:  Rest over weekend,  gradually resume as tolerated.   DIET:  No restrictions.   WOUND CARE:  Not applicable.   SPECIAL INSTRUCTIONS:  Do not take Celebrex or Primidone.   FOLLOW UP:  Call Dr. Darl Householder office for followup appointment, number  provided to patient.   HOSPITAL COURSE:  Please see History and  Physical for details.  Briefly,  this is a 60 year old woman with history of headaches since age 57, followed  by Dr. Sharene [name] for many years with intractable migraines.  Failed beta  blockers, calcium channel blockers, antidepressants, antiepileptic drugs.  Failed a C2-3 ganglionectomy, failed Botox injections, failed severe  cervical block.  She has had multiple MRIs of the head and neck with no  clearcut pathology except for a single stable small white matter lesion. The  patient has been seen by a neurologist in [place], [place]; by a headache  specialist in [place] U. Daisy [name], M.D.; by a headache specialist,  Dr. Annitta [name]; neurosurgeon, Daisy [name]. Daisy [name], M.D. as well as Dr.  Sharene [name].  The patient was admitted after worsening pain with poor response  to the latest treatment which was Primidone and she was admitted  for a DHE  protocol, but could not tolerate it because of side effects and perhaps  might have helped her headache a little bit.  She had some nausea with  jitteriness and was given Vistaril for this.  After repeated doses of DHE,  she denied substantial pain relief.  We tried increasing the DHE once again,  but did not really help much.  We decided to try her on lithium and  methadone.  She continued to have severe pain.  She began to improve a  little bit, but then at one point developed a rash that was documented by  nursing staff and the patient said she saw it herself, however, no physician  every saw it.  She was put on steroids and Benadryl.  The reaction, once  again, was never noted by a physician and it never returned.  After a few  days, the lithium was restarted which helped a little bit.  Methadone was  reintroduced again, but she could not tolerate that and did not find that it  worked.  However, after a few more days the lithium seemed to really be  kicking in and she was getting acceptable relief on a combination of Vioxx,  sometimes Toradol, and  the Lithium as well as Klonopin for sleep. She has  had a normal neurologic examination and there has never been a clearcut rash  that I could see.  At this point, the plan is to discharge her on the  lithium, Klonopin, and Vioxx.  She is to resume the Nexium and Synthroid at  home and follow up with Dr. Sharene [name] as an outpatient in the office.                                               Daisy [name], M.D.    CAW/MEDQ  D:  09/05/2002  T:  09/09/2002  Job:  [id number]   cc:   Daisy [name], M.D.  [address]  [place]  [place] [address]  Fax: [phone]

## 2011-04-21 NOTE — Discharge Summary (Signed)
NAMESHAHANA, CAPES               ACCOUNT NO.:  [id number]   MEDICAL RECORD NO.:  [id number]          PATIENT TYPE:  INP   LOCATION:  3039                         FACILITY:  [facility]   PHYSICIAN:  Theone Stanley, MD   DATE OF BIRTH:  09-Oct-1951   DATE OF ADMISSION:  09/26/2004  DATE OF DISCHARGE:  10/19/2004                                 DISCHARGE SUMMARY   HOSPITAL COURSE:  The patient was admitted for DHE protocol.  A PICC line  was originally placed.  She was to undergo 9 doses of 45-mg IV push of DHE  with Zofran for nausea.  Through her stay, the patient did not benefit from  the DHE.  She received Vistaril for her nausea, and it was noted on her  admission that she had some cellulitis of the left hip.  Because of this,  the patient was started on antibiotics, and because of her poor IV access,  it was felt that, for longterm, it would be best to place a Port-a-Cath.  Surgery was consulted, and a Port-a-Cath was placed on October 28.  She  continued to have issues with vomiting, which was felt secondary to the DHE.  For the cellulitis, the patient was placed on Ancef.  The patient progressed  well in regard to the cellulitis.  However, the patient continued to have  issues with DHE, including occipital neuralgia.  The patient was switched to  Zosyn on November 1st per recommendations from pharmacy for better gram-  negative coverage.  On November 3rd the patient did have slight improvement  in her headache with the DHE; however, she continued to have nausea.   The patient's cellulitis improved on the Zosyn.  She received a total of 7  days, which was felt to be adequate.  However, because her headache did not  completely resolve, there was a question of some further treatment for her,  including trigger point acupuncture and question of a specialist clinic in  [place], [place].  The patient continued to require IV narcotics in  addition to the DHE.  The patient noted that,  with trigger point _and  myofascial release, she did have some relief.  Unfortunately, while on the  protocol, the patient started to have diarrhea.  As it was felt that DHE  might be the cause, this was discontinued on November 11.  Stool studies  were also sent secondary to the diarrhea.  The diarrhea had resolved on its  own, and studies were not sent secondary to the fact that the patient did  not have any diarrhea to send.   For the migraines, the patient was switched to p.o. morphine instant-release  and OxyContin.  Prior to her discharge, she was given instruction on the  myofascial release and discharged on her home medications.   DISCHARGE MEDICATIONS:  1.  OxyContin 40 mg one p.o. b.i.d.  2.  MSIR 60 mg one p.o. q. 4-6h. p.r.n.  3.  Zofran 8 mg one p.o. q. 4-6h. p.r.n.  4.  Lisinopril 10 mg one p.o. daily.  5.  Magnesium oxide 400  mg one p.o. b.i.d.  6.  Vitamin B 400 mg one p.o. daily.  7.  Nexium 40 mg one p.o. b.i.d.   FOLLOW UP:  The patient was to follow up with Dr. Sharene Skeans.     Atta  AEJ/MEDQ  D:  12/05/2004  T:  12/05/2004  Job:  [id number]

## 2011-04-21 NOTE — Op Note (Signed)
   [name], [name]                         ACCOUNT NO.:  [id number]   MEDICAL RECORD NO.:  [id number]                   PATIENT TYPE:  AMB   LOCATION:  ENDO                                 FACILITY:  [facility]   PHYSICIAN:  John C. Madilyn Fireman, M.D.                 DATE OF BIRTH:  09-04-1951   DATE OF PROCEDURE:  10/07/2003  DATE OF DISCHARGE:                                 OPERATIVE REPORT   PROCEDURE:  Esophagogastroduodenoscopy.   ENDOSCOPIST:  Everardo All. Madilyn Fireman, M.D.   INDICATIONS:  Epigastric abdominal pain with nausea and diarrhea.   DESCRIPTION OF PROCEDURE:  The patient was placed in the left lateral  decubitus position and placed on the pulse monitor with continuous low-flow  oxygen delivered by nasal cannula.  She was sedated with 75 mcg of IV  fentanyl  and 7 mg IV Versed.  The video endoscope was advanced under direct  vision into the oropharynx and esophagus.  The esophagus was straight and of  normal caliber, the squamocolumnar line at 38 cm. There was no visible  hiatal hernia, ring, stricture, or other abnormalities at the GE junction.  The stomach was entered and a small amount of liquid secretions were  suctioned from the fundus.  Retroflexed view of the cardia was unremarkable.  The fundus and body appeared normal with no masses, polyps, diverticula, or  other mucosal abnormalities.  In the antrum, there were seen some mild  streaks of erythema and granularity consistent with antral gastritis.  No  focal erosions or ulcers.  The pylorus was not deformed and easily allowed  passage of the endoscope to the tip of the duodenum.  Both bulb and second  portion were well inspected and appeared to be within normal limits.  The  scope was then withdrawn back into the stomach and CLO test obtained.  The  scope was withdrawn.   The patient returned to the recovery room in stable condition.  She  tolerated the procedure well and there were no immediate complications.   IMPRESSION:  Mild antral gastritis.   PLAN:  Await CLO test for treatment for eradication if Helicobacter is  positive.  Otherwise continue Nexium and Carafate.                                               John C. Madilyn Fireman, M.D.    JCH/MEDQ  D:  10/07/2003  T:  10/07/2003  Job:  [id number]   cc:   [name]. [name], M.D.  [address]  [place]  [place] [address]  Fax: [phone]

## 2011-04-21 NOTE — Op Note (Signed)
NAMESHAYDA, KALKA                         ACCOUNT NO.:  [id number]   MEDICAL RECORD NO.:  [id number]                   PATIENT TYPE:  OIB   LOCATION:  3172                                 FACILITY:  [facility]   PHYSICIAN:  Stefani Dama, M.D.               DATE OF BIRTH:  1951/07/20   DATE OF PROCEDURE:  01/05/2004  DATE OF DISCHARGE:  01/05/2004                                 OPERATIVE REPORT   PREOPERATIVE DIAGNOSIS:  Lumbago and spondylosis with lumbar radiculopathy.   POSTOPERATIVE DIAGNOSIS:  Lumbago and spondylosis with lumbar radiculopathy.   PROCEDURE:  Insertion of temporary spinal cord stimulator electrode  percutaneously.   ANESTHESIA:  Local plus IV sedation.   SURGEON:  Stefani Dama, M.D.   INDICATIONS FOR PROCEDURE:  The patient is a 60 year old individual who has  had severe chronic back pain with diffuse spondylitic processes but no  surgical lesions.  She has been tried on a number of medications and  ultimately it was decided that, because of the refractoriness of her pain,  she may be a candidate for a spinal cord stimulator.  She is now taken to  the operating room for placement of a temporary catheter as a trial.   DESCRIPTION OF PROCEDURE:  The patient was brought to the operating room  supine on the stretcher.  She was given some mild slight sedation and turned  prone onto the operating table.  The back was prepped with Hibiclens and  alcohol and then draped sterilely.  The area along T12 and L1 was then  infiltrated with local after identifying the appropriate entry site.  A 14  gauge Tuohey needle was then inserted into the spinal canal in the epidural  space using fluoroscopic guidance.  Then, using fluoroscopic guidance, an  electrode wire catheter was placed into the Tuohey needle and threaded into  the epidural space.  Using radiographic confirmation, the catheter was  threaded up the dorsal aspect of the dura on the left side to the  appropriate site along the T9 vertebral body.  Once this was achieved  successfully, the needle, itself, was removed and the catheter was then  connected to the electrode leads and an anchoring mechanism was placed  attaching the catheter to the surface of the skin.  A dry, sterile dressing  was placed on top of the catheter and final confirmation was obtained  radiographically.  The patient was then returned to the recovery room in  stable condition.                                               Stefani Dama, M.D.    Merla Riches  D:  01/28/2004  T:  01/28/2004  Job:  [id number]

## 2011-04-21 NOTE — Discharge Summary (Signed)
[place]  Patient:    Daisy [name], Daisy [name]                      MRN: [id number] Adm. Date:  [id number] Disc. Date: [id number] Attending:  Lesly Dukes CC:         [facility], [address] [place]  [place]             Thyra Breed, M.D.  [name]. [name], M.D.   Discharge Summary  ADMITTING DIAGNOSIS: Status migrainosus.  DISCHARGE DIAGNOSES: 1. Status migrainosus. 2. Sympathetic mediated headache right frontotemporal.  PROCEDURE: 1. PICC line placement. 2. Stellate ganglion block. 3. MRI of the brain.  COMPLICATIONS OF ABOVE PROCEDURES:  None.  HISTORY OF PRESENT ILLNESS:  This patient is a 60 year old right-handed, white female born [birth year] with a history of migraine headaches.  This patient has been followed by Dr. Billie Ruddy prior to admission and has had a recent exacerbation of headaches after a headache-free period of two months.  The patient has had daily headaches for two weeks prior to admission, that are associated with two types of pain.  One is a severe boring, constant, right frontotemporal headache.  Then there is a more throbbing occipital pain that is also present.  The patient has had significant nausea and vomiting particularly the last three to four days prior to admission.  The patient has been getting daily shots through Dr. Nils Flack office and has not improved with the pain.  The patient is referred by Dr. Jamesetta Geralds for further evaluation.  The patient has, in the past, undergone a C2-3 fusion with ganglionectomy with Dr. Gasper Sells that resulted in a headache free period for almost two years.  Neurology was asked to see this patient for evaluation of headache.  PAST MEDICAL HISTORY:  1. Status migrainosus on admission, as above.  2. History of hyperthyroidism.  3. Asthma.  4. Breast cancer.  5. C2-3 fusion with ganglionectomy in the past.  6. History of hysterectomy.  7. History of tonsillectomy.  8. History of gallbladder resection.  9. History of appendectomy. 10. History of chemo-and-radiation therapy for breast cancer in the past.     Lumpectomy was performed.  MEDICATIONS ON THIS ADMISSION:  Synthroid 0.05 mg daily, the patient was recently started on ______ but took only 1 tablet and stopped the medication due to excessive anxiety and nervousness.  ALLERGIES:  IMITREX, AMERGE, STEROIDS, IVP DYE, REGLAN, BENADRYL, ALSO TO THORAZINE, causes nervousness, and ______ causes nervousness.  SOCIAL HISTORY:  The patient does not smoke or drink.  Please refer to history and physical dictated for social history, family history, review of systems, and surgical examination.  LABORATORY DATA:  Laboratory values, noted before.  Sodium 142, potassium 3.8, chloride 108, CO2 of 30, glucose of 86.  BUN of 3, creatinine 0.7, calcium 8.4.  On admission; however, BUN was 28, creatinine 0.7, sed rate was 6, white count 5.5, hemoglobin 14.9, hematocrit 43.3, MCV 94.2, platelets 225.  TSH was 0.9.  Valproic acid level during this admission was 47.6.  The patient had ammonia level of 39, total protein 6, albumin of 3.8, AST of 32, ALT of 25, ALP of 62, total bilirubin 0.26.  EKG reveals normal sinus rhythm, normal EKG, heart regular at 81.  HOSPITAL COURSE:  This patient has had protracted headaches throughout this admission.  The patient had difficulty with IV access and eventually a PICC line had to be placed  through radiology.  The patient was started on IV fluids as she was somewhat dehydrated due to excessive nausea and vomiting prior to admission.  IV fluids were maintained throughout the hospitalization.  This patient was started on DHE-45 protocol which includes 1 mg of IV  DHE-45 every 8 hours for three days.  The patient was premedicated with IV Phenergan. The patient has had minimal improvement of the headache with the DHE-45 protocol and was also being  given IV Depacon as well, 500 mg IV q.8h.  The patient had some minimal improvement with the DHE-45 protocol, but the use of oxygen therapy improved the occipital component of the headache but the right frontotemporal sympathetic mediated pain persisted.  The patient was started on Verapamil which really offered very minimal improvement.  The patient did get some dizziness from the Verapamil.  The patient had burning sensations in the esophagus from IV Toradol and Thorazine resulting in increased nervousness.  Zofran did help some of the nausea and vomiting.  Due to the ongoing persistent pain, the patient was seen by anesthesiology for stellate ganglion block which was performed on 07/16/01. This resulted in complete cessation of headache for a period of time.  The headache has returned, but to a lesser degree, on the day of discharge.  The patient notes increased pain when she is up and moving about.  Nausea is present, but not as severe.  The patient is able to eat and drink some.  At the present time, plans are made to switch patient off of clonidine which was started during this hospitalization, to Cardura at night and to maintain on Depakote 500 mg ER tablets, 2 at night.  The patient will be discharged on Synthroid 0.05 mg daily, Restoril 30 mg at night, as needed for sleep; and be given Rockne Menghini, if needed, for pain. The patient will follow up the [facility], with Dr. Elie Goody in three weeks and will be set up for an appointment with Dr. Thyra Breed for possible ongoing series of stellate ganglion blocks to improve her pain.  At the time of discharge, the patient is bright, alert, and cooperative.  The patient is complaining of ongoing pain.  She did experience some hoarseness following the stellate ganglion block, but this has improved.  The patient is ambulatory at the time of discharge. DD:  07/17/01 TD:  07/17/01  Job:  [id number]

## 2011-04-21 NOTE — Assessment & Plan Note (Signed)
[facility]                             PULMONARY OFFICE NOTE   NAME:[name], Daisy VEITCH                      MRN:          [id number]  DATE:04/02/2007                            DOB:          [birth year]    HISTORY:  A 60 year old white female who carries a diagnosis of  pseudoasthma with question of true asthmatic component now doing much  better in terms of all of her symptoms, off of ACE inhibitors and on  Symbicort 80/4.5, two puffs b.i.d. with no nocturnal cough, wheeze, or  significant dyspnea during the day.   Reviewed her medicines with her in detail.  Note that she did not follow  the instructions given in terms of p.r.n. use of tramadol which she says  she uses every 4 hours around the clock. She did remember, however, my  instructions that if she did not need Maxair then not to use it and in  fact has not needed Maxair since she started Symbicort on her previous  visit April 15.   On physical examination she is a obese, somber but not overtly  depressed, ambulatory white female in no acute distress. She is  afebrile, stable vital signs.  HEENT: Unremarkable, pharynx clear.  LUNG FIELDS: Perfectly clear bilaterally to auscultation and percussion.  There is a regular rate and rhythm without murmur, gallop, rub.  ABDOMEN: Soft, benign.  EXTREMITIES: Warm without calf tenderness, cyanosis, clubbing, or edema.   MDI technique was reviewed and is only about 25% effective at baseline,  improves to about 50%.   We walked her around the office twice because she was saturating only at  92% on room air. She maintained saturation at 93% but says she felt  dizzy more than winded at the end of 2 laps.   IMPRESSION:  This patient is suffering from significant obesity with  deconditioning more so than asthma at present. In fact, I am not 100%  confident she has asthma at all. If we treat rhinitis and reflux  aggressively and avoid ACE inhibitors she  probably could come off of the  Symbicort completely. Before doing that though I want her to switch  tramadol to p.r.n. use and see if in fact the cough and the wheezing  have resolved to her satisfaction. If she has been off the tramadol  successfully for a week then I have given her instructions on how to  taper off Symbicort.   Either way I would like to see her back here at then end of a month for  a set of PFTs and reviewed with her every 1 of the recommendations I  made previously in writing using the  instruction sheet that she was given before to emphasize how to use  maintenance versus p.r.n.  medicines (she did not really get the message the first time around  however, and I did the best I could to communicate that message today in  writing again).     Charlaine Dalton. Sherene Sires, MD, [facility]  Electronically Signed    MBW/MedQ  DD: 04/03/2007  DT: 04/03/2007  Job #: [id number]

## 2011-04-21 NOTE — Assessment & Plan Note (Signed)
[facility]                             PULMONARY OFFICE NOTE   NAME:Daisy [name], Daisy [name]                      MRN:          [id number]  DATE:03/08/2007                            DOB:          03-21-1951    The patient is a 60 year old white female patient of Dr. Sherene Sires who was  previously seen in the practice with reactive airway with underlying  suspected vocal cord dysfunction, previously exacerbated by ACE  inhibitors.  The patient has not been seen in the practice in greater  than 2 years.  She presents today related to a persistent cough over the  last 2 weeks.  She complains of a productive cough with thick yellowish-  green sputum, shortness of breath.  She was seen by her primary care  physician, Dr. Neita Carp, and informed that she possibly could have  bronchitis and/or pneumonia.  The patient was started on Avelox, which  she is currently on day 3 of 10.  The patient complains that, over the  last 2 months, she has had intermittent episodes of nasal congestion,  watery, itchy eyes, and post-nasal drainage.  The patient did have some  pink-tinged sputum initially, before starting on Avelox, but that has  now resolved.  Sputum seems to be clearing, and only some mild green at  times, and mostly clear now.  The patient denies any frank hemoptysis,  orthopnea, PND, or leg swelling.  No recent travel.   PAST MEDICAL HISTORY:  Reviewed.   CURRENT MEDICATIONS:  Reviewed.   PHYSICAL EXAM:  The patient is a pleasant female in no acute distress.  She is afebrile with stable vital signs.  O2 saturation is 92% on room  air.  HEENT:  Unremarkable.  NECK:  Supple without cervical adenopathy.  No JVD.  LUNGS:  Sounds reveal coarse rhonchi bilaterally.  CARDIOVASCULAR:  Regular rate and rhythm.  ABDOMEN:  Soft and nontender.  EXTREMITIES:  Warm without any edema.  SKIN:  Warm without rash.   IMPRESSION AND PLAN:  Acute tracheobronchitis with questionable  recently  diagnosed pneumonia.  The patient states that her chest x-ray was 1 week  ago.  We will hold off on chest x-ray at this time, and we will repeat  in 2 weeks on followup.  In the meantime, the patient is to finish  Avelox, as recommended, add in Mucinex DM twice daily.  Stop flax seed  oil, which could be irritating the upper airways, and begin Protonix 40  mg daily over the next 2 weeks for any residual reflux that could be  irritating the airways.  She will recheck here in 2 weeks with Dr.  Sherene Sires, or sooner if needed for followup chest x-ray.  If symptoms worsen,  she is to contact our office immediately for a closer followup.      Rubye Oaks, NP  Electronically Signed      Charlaine Dalton. Sherene Sires, MD, [facility]  Electronically Signed   TP/MedQ  DD: 03/13/2007  DT: 03/13/2007  Job #: [id number]

## 2011-04-21 NOTE — H&P (Signed)
[place]. [facility]  Patient:    Daisy [name], Daisy [name] Visit Number: [id number] MRN: [id number]          Service Type: MED Location: [phone] Attending Physician:  Melvyn Novas Dictated by:   Melvyn Novas, M.D. Admit Date:  05/20/2002 Discharge Date: 06/04/2002   CC:         [name]. [name], M.D.  Aura Camps, M.D.   History and Physical  HISTORY OF PRESENT ILLNESS:  This patient is a 60 year old right-handed Caucasian female, pleasant and cooperative who presented today to the [facility] Emergency Room after 10 days of waxing and waning lightheadedness, vision bilaterally decreased acuity loss, and peripheral vision loss, especially in the right eye, right orbital and retro-orbital pain, temporal throbbing associated with two days of nausea and vomiting and the feeling of drifting to the left side when walking.  In addition, she noted left hand antebrachial tingling.  The patient has no complaints of true weakness, no impairment of consciousness.  She states that she is a long-term patient of Dr. Danie Chandler at [facility] and Dr. Ellison Carwin who sees her for migraines at the [facility] office.  PAST MEDICAL HISTORY: 1. Hypothyroidism. 2. Severe intractable migraines which starts with migraine auras. 3. Status post pancreatitis after Depakote. 4. Status post mastectomy for mammary carcinoma. 5. Status post cervical _____.  SOCIAL HISTORY:  The patient is married, in her second marriage for over 20 years.  She has two children, that are age 59 and 23 years old.  She is already a grandmother.  She states that she is normally active in life, however, her headaches really impair her.  She does not drink, does not smoke. She is a Engineer, maintenance ([employer]).  MEDICATIONS: 1. Synthroid 0.50 mcg. 2. She is also taking calcium. 3. Duragesic patches. 4. Prilosec 40 mg q.d.  FAMILY HISTORY:   There is a strong family history for migraine in the maternal family of the patient.  Migraines also effect two of her children.  PHYSICAL EXAMINATION: VITAL SIGNS:  Blood pressure 158/80, heart rate 95, respiratory rate 18.  VISUAL ACUITY:  The patient was unable to read even largest print on the visual acuity chart.  One ocular reading was the right eye at more than 6 feet of distance, however, revealed a vision of approximately 20/250.  The pupils appeared equal and reactive to lung.  LUNGS:  Clear to auscultation.  HEART:  Regular rate and rhythm.  No murmur.  Peripheral pulses are present.  ABDOMEN:  Soft and nontender.  There is some epigastric tenderness.  The patient had been vomiting for a couple of days.  NECK: Supple.  No sign of meningismus.  NEUROLOGICAL:  The patient is in severe pain and displays pain in her movement and facial mimic.  She is nausea and cooperative as long as she is in a dark room.  However, she cannot cooperative with the ophthalmoscope and the bright lights associated with it.  After receiving additional pain medications, ! narcotics and Phenergan, she is able to tolerate further examination.  Cranial nerves:  The patient has prompt reaction to light in both pupils from 5 to 3 mm bilaterally.  Her extraocular movements are intact.  There is optic kinetic nystagmus without smooth pursuit when looking towards the left.  Both eyes seem to skip in a psychotic movement apart of the temporal visual left field.  Peripheral vision on the right eye seems also decreased.  The patient states that this appears blurred and is impaired by pain.  The patient that she sees light flashing and little crystal confetti in her left eye.  There is no facial droop. Tongue and uvula are midline.  The neck is supple.  Orbital pain and temporal pain are on the right only.  The patient is known to have right-sided hemicranial pain.  She has never been affected by left sided  pain. Motor 5/5.  Equal tone.  Deep tendon reflexes are 1+ bilaterally equally. Downgoing toes.  No clonus.  Finger-nose bilaterally equal.  Heel-to-shin bilaterally equal.  Sensory intact to touch, pinprick, vibration, and temperature in all extremities.  ASSESSMENT: 1. Severe orbital and retro-orbital pain in the right eye for over 10 days.    The patient states this is not migraine as usual.  This is a status    migrainosus.  Associated visual loss is peripheral.  The field cut and    blurring of seem to decrease with pain medication.  Question also of    possible optic neuritis of vasculitic component or artery occlusion. 2. The patient has nausea, vomiting and headache so this is most likely    a migraine aura with photophobia. 3. The patient has dysesthesias in her left upper extremity only without    associated muscle strength loss, change in deep tendon reflexes or    coordination.  I feel at this time this is migraine related. 4. Will also rule out headache secondary to temporal arteritis,    sarcoidosis, Lyme disease and other more exotic reasons for    intracranial pain.  PLAN: 1. The patient will undergo an MRA and MRI of the brain and orbit. 2. We will obtain a sedimentation rate and C-reactive protein, blood    count and differential, TSH. 3. ACE inhibitor, level of angiotensin converting enzyme and homocystine. 4. We will treat with Toradol, Demerol and Vistaril as needed. 5. The patient is admitted to neurology, not to telemetry.  She can go to    a regular floor. 6. I have called in an ophthalmology consult for tonight as my evaluation    of the visual field and the vagina was very complicated by the patients    photophobia. 7. In addition, I would like to make sure that the patient receives an    ophthalmology consult for future reference in case of more visual    associated symptoms.  8. The patient will be followed through the weekend by me but will    change  to Dr. Carlyon Prows service upon admission. Dictated by:   Melvyn Novas, M.D. Attending Physician:  Melvyn Novas DD:  06/11/02 TD:  06/14/02 Job: [id number]

## 2011-04-21 NOTE — Discharge Summary (Signed)
[place]. [facility]  Patient:    Daisy [name], Daisy [name] Visit Number: [id number] MRN: [id number]          Service Type: MED Location: [phone] Attending Physician:  Daisy Floro Dictated by:   Viviana Simpler, M.D. Admit Date:  08/20/2001 Discharge Date: 08/24/2001   CC:         John C. Madilyn Fireman, M.D.  Miguel Aschoff, M.D.  Deanna Artis. Sharene Skeans, M.D.   Discharge Summary  DATE OF BIRTH:  [birth year]  CONSULTATION:  Dr. [name] Buccini on call for [name].  PROCEDURE:  Peripherally inserted central catheter line placement for intravenous access.  DISCHARGE DIAGNOSES:  1. Acute onset of abdominal pain, resolved, etiology undetermined.  2. History of pancreatitis x 3, most recently 10 years ago.  3. History of migraine headaches with status migrainous July 07, 2001,     treated with stellate ganglion block.  Exacerbation of headache this     admission.  4. History of C2-C3 fussion with ganglionectomy by Dr. Gasper Sells.  5. History of hypothyroidism.  6. History of asthma.  7. History of breast cancer treated with lumpectomy, chemotherapy and     radiation in 1993.  8. History of total hysterectomy, cholecystectomy and appendectomy.  9. History of multiple allergies/intolerances (Imitrex, Benadryl, Emerge,     Thorazine, intravenous dye, steroids, Reglan, Phenergan and Stadol). 10. History of rectal bleeding with colonoscopy in February 2002, indicating     small internal hemorrhoids. 11. History of fibromyalgia and chronic fatigue. 12. History of peptic ulcer disease and esophageal ulcers.  DISCHARGE MEDICATIONS: 1. Depakote 1000 mg at night, 500 mg in the morning. 2. Synthroid 50 mcg p.o. q.d. 3. Prilosec 40 mg p.o. b.i.d. x 1 week, then q.d. 4. Carafate slurry 1 g q.a.c. and q.h.s. 5. Stop Verapamil.  HISTORY OF PRESENT ILLNESS:  The patient is a 60 year old woman with history of migraine headaches and remote history of  pancreatitis who presented to the hospital after developing the acute onset of excruciating epigastric pain radiating to the back accompanied by nausea without vomiting.  For complete details of presentation, please see my dictated History and Physical.  HOSPITAL COURSE:  Given Theresas clinical symptoms, the recent addition of Depakote to her migraine regimen and her history of recurrent pancreatitis, acute pancreatitis possibly secondary to Depakote was considered high in the differential.  Given her profound symptoms and the inability to secure a peripheral IV access, a PICC line was immediately placed for hydration and narcotic analgesics.  While diagnostic studies were underway, empiric treatment for pancreatitis, peptic ulcer disease and esophagitis were initiated.  Pancreatic enzymes ultimately returned normal and right upper quadrant ultrasound ruled out a common bile duct stone.  Symptoms improved somewhat, but not entirely and GI opinion was provided by Dr. Nadine Counts Buccini.  Proton pump inhibitor was increased to twice a day.  Carafate was added to the regimen for possible bile acid reflux and abdominal CT was performed.  All objective data were unrevealing and Carafate seemed to contribute the most to her improvement.  Gradually, diet was advanced, but further improvement was hampered by the development of constipation.  This was treated aggressively with results occurring at the time of discharge.  As her abdominal symptoms were remitting, unfortunately Daisy [name] developed a migraine headache.  This was treated symptomatically as has been the case before with the exception of Verapamil which was discontinued early in her hospitalization for borderline hypotension.  Depakote had  been resumed as soon as pancreatitis had been ruled out the morning following admission and gradually the headache subsided.  Daisy [name] was discharged in stable and improved condition.  The etiology  of her abdominal pain may have been secondary to esophagitis, bile acid reflux or peptic ulcer disease.  She will continue with Carafate and increased interval dosing of her proton pump inhibitor to follow up with either Dr. Madilyn Fireman or her family practice physician if symptoms recur. Dictated by:   Viviana Simpler, M.D. Attending Physician:  Daisy Floro DD:  11/13/01 TD:  11/13/01 Job: [id number]

## 2011-04-21 NOTE — Consult Note (Signed)
[place]. [facility]  Patient:    Daisy [name], Daisy [name] Visit Number: [id number] MRN: [id number]          Service Type: MED Location: [phone] Attending Physician:  Daisy Floro Dictated by:   Florencia Reasons, M.D. Proc. Date: 08/21/01 Admit Date:  08/20/2001   CC:         [name]. [name], M.D.   Consultation Report  REASON FOR CONSULTATION:  Dr. Charissa Bash of the [facility] asked [name] to see this 60 year old female patient of [name] because of abdominal pain.  HISTORY OF PRESENT ILLNESS:  The patient, who is known to me from consultation about 10 years ago, was admitted to the hospital yesterday because of severe left-sided midabdominal pain which was stabbing in character and had an abrupt onset yesterday morning while eating breakfast, and which radiated through to her back and across her lower abdomen, without fevers but with several liquidy bowel movements yesterday.  At baseline, the patient does not have chronic GI complaints apart from some intermittent reflux, for which she had previously been on Prilosec.  She does have a rather extensive previous GI evaluation including multiple laparoscopies, endoscopic evaluation, flexible sigmoidoscopy, 24-hour pH monitoring, colonoscopy with random biopsies, ERCP x 2 with empiric sphincterotomy in 1992, ultimately a laparoscopic cholecystectomy in 1992, endoscopic evaluations again seven years ago and most recently a colonoscopy by my partner, [name], for rectal bleeding in February 2002 which was unrevealing except for internal hemorrhoids.  With that background, the patient was doing well until about one week ago when she had some crampy left-sided abdominal pain associated with malaise and nausea but no vomiting and no food intolerance or exacerbation of symptoms with eating.  She actually was improving until yesterday morning.  At that time she had the  abrupt onset of the above mentioned rather severe left-sided abdominal pain which somewhat generalized across her abdomen.  The patient does note an increase in her reflux symptoms since going on verapamil and Depakote about one month ago because of increased problems with her migraine headaches.  Since in the hospital, the patient has had good response of her pain with Demerol and has been afebrile.  PAST MEDICAL HISTORY:  Possible allergy to IVP dye (question clot).  CURRENT MEDICATIONS OUTPATIENT:  Synthroid, Depakote, Prilosec, verapamil, and soy supplement.  OPERATIONS:  Remote appendectomy, cholecystectomy, hysterectomy, and breast lumpectomy for cancer.  MEDICAL ILLNESSES:  Breast cancer status post lumpectomy and chemo-radiation therapy about 10 years ago, hypothyroidism, migraine headaches, history of asthma possibly related to reflux disease, GERD, fibromyalgia, and chronic hyperamylasemia with a questionable history of pancreatitis in the past.  HABITS:  Nonsmoker.  Nondrinker.  FAMILY HISTORY:  Negative for GI tract illnesses other than Crohns disease in a niece.  Her mother died of CVA, her father of coronary disease.  SOCIAL HISTORY:  The patient is remarried having had a difficult initial marriage for which she went through a divorce more than 10 years ago.  She has two grown children.  REVIEW OF SYSTEMS:  No dysphagia but increased reflux symptoms recently.  No chronic abdominal pain.  Usually has a bowel movement pretty much every day. No noted rectal bleeding.  PHYSICAL EXAMINATION:  GENERAL:  A pleasant, cooperative, healthy appearing female in no evident distress.  Her skin is remarkable for a deep tan from a tanning bed.  HEENT:  She is anicteric without pallor.  The oropharynx is benign.  NECK:  No significant thyromegaly or supraclavicular adenopathy is appreciated.  CHEST:  Clear.  HEART:  No gallops, rubs, or ______ arrhythmias.  ABDOMEN:   Slightly adipose and slightly tympanitic, not really protuberant but slightly "pudgy."  The patient states that she is distended.  However, percussion discloses only mild tympany.  There is no obvious ascites.  No palpable hepatosplenomegaly is present.  Bowel sounds are normal and nonobstructive in character.  No guarding, mass, or tenderness are appreciated other than perhaps a little bit of subjective right lower quadrant tenderness.  RECTAL:  No evident perianal disease, normal sphincter tone, and an empty rectal ampulla with a smudge of brown stool that gives a little bit of a blue-green reaction but not a classic positive reaction.  EXTREMITIES:  There is no tibial edema.  LABORATORIES:  Unrevealing.  Chemistries are pertinent for normal amylase, lipase, liver chemistries.  White count 7200, hemoglobin 13.6, platelets normal, MCV normal.  BMET normal.  Albumin 3.1.  Plain abdominal film showed a little bit of increased large bowel gas without evidence of obstruction or perforation.  IMPRESSION: 1. Nonspecific abrupt abdominal pain. 2. Gastroesophageal reflux disease, with recent exacerbation. 3. Prior history of cholecystectomy and questionable problems from common duct    stone for which a sphincterotomy was performed 10 years ago, also status    post multiple other gastrointestinal evaluations including endoscopy,    colonoscopy, and 24-hour pH monitoring.  DISCUSSION:  The patients symptoms do not fall into a tidy diagnosis which would be compatible with her history, physical findings and labs.  In fact, the paucity of physical findings and laboratory abnormalities would suggest the probable absence of a significant underlying organic pathologic process. Thoughts which come to mind as potential possibilities would include distal pancreatitis (although the normal pancreatic enzyme levels would go against having), transient or intermittent partial bowel obstruction, gas  trapping  such as splenic flexure syndrome, ischemia, for example the splenic flexure of the colon.  Less likely would be malignant processes such as colon cancer (negative colonoscopy six months ago) or lymphoma.  PLAN: 1. Intensify antipeptic therapy.  Specifically, increase Protonix to b.i.d.    dosing (may give orally). 2. I see no reason not to advance the patients diet, and she is interested in    having this done. 3. Although the yield would be low, I feel that, given the lack of clarity    regarding this patients symptoms and the complexity of her past medical    history, that an abdominopelvic CT scan would be warranted to help exclude    overt organic pathology.  The patient is agreeable to this.  I think it can    be done without IV contrast. 4. I would probably add sucralfate to the patients regimen to help with her    nausea in case it is due to bile reflux. 5. Further management will depend on the patients clinical evolution. Dictated by:   Florencia Reasons, M.D. Attending Physician:  Daisy Floro DD:  08/21/01 TD:  08/21/01 Job: [id number]

## 2011-04-21 NOTE — Op Note (Signed)
Daisy [name], Daisy [name]                         ACCOUNT NO.:  [id number]   MEDICAL RECORD NO.:  [id number]                   PATIENT TYPE:  INP   LOCATION:  3008                                 FACILITY:  [facility]   PHYSICIAN:  Stefani Dama, M.D.               DATE OF BIRTH:  Mar 26, 1951   DATE OF PROCEDURE:  01/05/2003  DATE OF DISCHARGE:                                 OPERATIVE REPORT   PREOPERATIVE DIAGNOSIS:  C5-C6 spondylosis, herniated nucleus pulposus and  cervical radiculopathy.   POSTOPERATIVE DIAGNOSIS:  C5-C6 spondylosis, herniated nucleus pulposus, and  cervical radiculopathy.   PROCEDURE:  Anterior cervical diskectomy, arthrodesis with structural  allograft and Synthes fixation, C5-C6.   SURGEON:  Stefani Dama, M.D.   FIRST ASSISTANT:  Payton Doughty, M.D.   ANESTHESIA:  General endotracheal anesthesia.   INDICATIONS:  The patient is a 60 year old individual who has had  significant tingling into the neck, shoulder and into the arms. She has also  had a history complicated by severe headaches, which have in the past been  felt to be secondary to neck pain and neck problems. She underwent a  ganglionectomy and a fusion at C2 and C3 years ago. She notes that this  completely relieved her headaches for a period of about 5 years. Since that  time she has had intermittent headaches, but more recently she has had  centralized neck pain posteriorly with some radiation of tingling and  numbness into the fingertips, particularly on the right hand and more  recently on the left hand.   An MRI demonstrates that she  has a solid arthrodesis at CO2-C3 with  posterior fixation hardware and at C5-C6 she has spondylitic changes with  collapse of the disk space and a large bony ridge with a small herniation of  the disk eccentric to the right side. There is a moderate amount of  foraminal stenosis on the right side. She has been advised regarding  surgical decompression  and  stabilization  of the C5-6 level, and after  careful consideration, she is taken to the operating room.   DESCRIPTION OF PROCEDURE:  The patient was brought to the operating room  supine on the stretcher. After the smooth induction of general endotracheal  anesthesia she was placed in 5 pounds of halter traction. The neck was  shaved and prepped with Duraprep and draped in a sterile fashion.   A transverse incision was made on the left side of the neck and was carried  down through the platysma. A plane between the sternocleidomastoid and the  strap muscles was dissected bluntly until the prevertebral space was reached  and the first identifiable disk space was noted to be that of the C6-C7.  Dissection was then carried slightly cephalad. The C5-6 was exposed and the  self-retaining Caspar retractor was placed in the wound.   The ventral aspect of the disk  space was then opened with a #15 blade  and  then a combination of curets and rongeurs were used to evacuate remarkably  degenerated disk material from within the space. A high-speed air drill was  used with a 2.3 mm dissecting tool then to dissect and remove the large  osteophyte that was protruding from the inferior margin of the body of C5.   The uncinate processes were moderately hypertrophied and these were opened  particularly on the right side. Some subligamentous disk material was found  in this area and this was dissected free and clear. In the end, the common  neural tube, the takeoff of each C6 nerve root was well decompressed and  palpated easily. Epidural bleeding was contained with some small pledgets of  Gelfoam soaked in thrombin and bipolar cautery used very carefully.   In the end, the thrombin was removed. The area was copiously irrigated with  antibiotic irrigating solution and then a 7 mm track cortical graft was  fashioned to fit into the interspace with the ventral aspects of the bone  graft being trimmed after  the cortical surface was placed dorsally.   Traction was removed and a 16 mm standard sized Synthes plate was affixed to  the ventral aspect of the vertebral bodies and secured with 4 locking 4 x 14  mm screws. Hemostasis was carefully obtained in the soft tissues. A  localizing radiograph identified good position of the surgical construct.  The surgery was done with the help of Dr. Trey Sailors, who provided retraction  and helped with dissection of the soft tissues to enter the disk space.   Closure was then performed with 3-0 Vicryl in the interrupted fashion in the  platysma and 3-0 Vicryl in the subcuticular tissues and the skin. Dermabond  was placed on the skin.   The patient tolerated the procedure well. She was returned to the recovery  room in stable condition.                                               Stefani Dama, M.D.    Daisy [name]  D:  01/05/2003  T:  01/05/2003  Job:  [id number]

## 2011-04-21 NOTE — Consult Note (Signed)
Daisy [name], Daisy [name]                         ACCOUNT NO.:  [id number]   MEDICAL RECORD NO.:  [id number]                   PATIENT TYPE:  INP   LOCATION:  0380                                 FACILITY:  [facility]   PHYSICIAN:  Deanna Artis. Sharene Skeans, M.D.           DATE OF BIRTH:  11-Dec-1950   DATE OF CONSULTATION:  04/07/2004  DATE OF DISCHARGE:                                   CONSULTATION   REASON FOR CONSULTATION:  Daisy [name] is a 60 year old right-handed  Caucasian woman well known to me for years with a history of intractable  migraines, cervical and lumbar spondylosis, and history of breast cancer  status post chemotherapy and radiation with complete remission.   The patient had been through virtually every prophylactic and abortive  medication for headaches.  I had her go to [facility] for Botox injections.  Finally, we had an upper cervical  laminectomy.  All of these failed.  Interestingly, she went to a wellness  clinic in [place], [place] which gave her some form of hormone medication as well  as allergy drops.  This seemed to work extremely well for the patient and  she was headache-free for nearly a year.  The patient did not require  injections and did not require over-the-counter medication.  This was a  remarkable achievement considering that for the past 10 years the patient  has had very few days without any headache and typically would be seen in  Dr. Recardo Evangelist office on the order of once or twice a week for narcotic  injections which are the only medications that brought her symptoms under  control.  The patient has had prolonged hospitalizations before.  On one we  attempted to give her prednisone to treat her status migrainous.  Not only  did it not work, but she developed infection and was in the hospital for  about 42 days.   The patient did well on the bioidentical progesterone and allergy drops.  She, however, continued to have  problems with the low back and was tried  with a spinal stimulator that was placed percutaneously initially and then  later was placed subcutaneously.  The patient began to have headaches very  shortly after the percutaneous placement of the temporary stimulator.  I  think that the reasons for this would be likely totally coincidental.  I  wondered about the possibility of creating a dural leak when electrodes were  placed.  That has been visited by Dr. Barnett Abu and also the [employer] and they really do not think that that is very likely.   The patient has now had headaches for about 3 or 4 weeks daily/continuous.  The patient had received narcotic analgesics and was placed in the hospital  because of intractable headaches.  She ultimately had a CT scan of the brain  without contrast that was normal.  She had a  PIC line placed to give her  medications.   REVIEW OF SYSTEMS:  Negative for productive cough, fever, rhinorrhea, chest  pain, bleeding dyscrasias.  The patient is positive for arthritis; nausea;  vomiting; severe sensitivity to light, sound, and movement.   PAST MEDICAL HISTORY:  1. Breast cancer, August 1993.  2. C2-3 fusion, August 1993.  3. Chronic low back pain.  4. Surgery x3 for spinal stimulation.  The patient has also had hip and     spine implants.   Medical problems include hypertension in addition to her migraines.  As  mentioned above, the patient has had intolerances to most medications that  are used for migraine.  I do not believe that she has had a true allergy.   CURRENT MEDICATIONS:  1. Bioidentical progesterone.  2. Allergy drops.  3. Lisinopril 10 mg daily.  4. Cymbalta 20 mg q.12h.  5. Dulcolax 20 mg at bedtime.  6. Colace 100 mg twice a day.  7. Klonopin 1 mg t.i.d.   Pain medicines are now:  1. Dilaudid 1-2 mg q.4h. as needed.  2. Zofran 4 mg q.6h. as needed.   SOCIAL HISTORY:  The patient is married and has grown children.  She  does  not use tobacco or alcohol or illicit drugs.  There is a positive family  history of migraines; also, hypertension in her mother.   PHYSICAL EXAMINATION TODAY:  GENERAL:  The patient is lying in a quiet room  with a towel over her head and shades on her face.  She speaks in soft  tones.  I was able to get her to take off the towel, the shades, and turn on  a fairly dim light in order to examine her.  VITAL SIGNS:  Blood pressure 103/62, resting pulse 101, respirations 18,  temperature 98, pulse oximetry 95% in room air.  HEENT:  No signs of infection.  She had decreased range of motion  particularly in flexion of her neck.  No cranial or cervical bruit.  LUNGS:  Clear to auscultation.  HEART:  No murmurs, pulses normal.  ABDOMEN:  Soft, protuberant, bowel sounds normal.  No hepatosplenomegaly.  EXTREMITIES:  Well formed without edema, cyanosis, alterations in tone, or  tight heel cords.  NEUROLOGIC:  Mental status:  The patient was awake, alert, attentive,  appropriate.  Normal memory, orientation, language, and praxis.  Cranial  nerves:  Round reactive pupils.  The patient does not have an afferent  pupillary defect.  She has sharp disc margins, normal color to her discs.  I  saw venous pulsations in the left eye but not in the right.  I do not see  any signs of optic atrophy and there is no afferent pupillary defect  indicating that the retina in the right eye is functioning normally despite  the problems it has given to the patient.  Symmetric facial strength,  midline tongue and uvula, air conduction greater than bone conduction.  Motor examination:  Normal strength, tone, and mass.  Good fine motor  movements.  No pronator drift.  Sensation intact to cold, vibration,  stereoagnosis.  Cerebellar examination:  Good finger-to-nose, rapid  repetitive movements.  Gait was not tested.  Reflexes were symmetric and normal.  The patient had bilateral flexor plantar responses.    IMPRESSION:  Intractable migraines, 346.11.  I emphasize the fact that the  retina appears to be normal both from visual inspection and functionally.  Status migrainous happens with pupils very infrequently.  It  is often  treated with antiepileptic drugs with variable success but she has not been  able to tolerate any of the antiepileptic drugs that would be useful.  Nonetheless, this has happened to her before and she has regained vision in  the past.  I believe that the right eyelid is more a matter of squinting  from pain rather than ptosis.  She has full excursion of the eyelid in both  eyes in the dark with visual pursuit.   Therefore, there is no need for further workup at this time.  Ultimately, if  she continues to be unable to see with the right eye we will have to perform  a visual evoked potential to look for a functional measure of retinal  health.  We may need an ophthalmologist's opinion as well.  I feel fairly  comfortable that the retina is normal and that function will return.  I  think the Cymbalta which has been started as a prophylactic medication is a  good idea.  The patient is  at least tolerating it as this point.  I do not have any other suggestions,  unfortunately, at this time.  I have reviewed the CT scan and find it to be  normal.   I appreciate the opportunity to see Mrs. Barkey.  If you have questions about  this do not hesitate to contact me.                                               Deanna Artis. Sharene Skeans, M.D.    [facility]  D:  04/07/2004  T:  04/07/2004  Job:  [id number]   cc:   Melissa L. Ladona Ridgel, MD  [address] [place], [place] [address]  Fax: [phone]   [name]. [name], M.D.  [address]  [place]  [place] [address]  Fax: [phone]

## 2011-04-21 NOTE — H&P (Signed)
[place]  Patient:    [name], [name] Visit Number: [id number] MRN: [id number]          Service Type: MED Location: [phone] Attending Physician:  Miguel Aschoff Dictated by:   Viviana Simpler, M.D. Admit Date:  08/20/2001   CC:         Miguel Aschoff, M.D., [facility] C. Madilyn Fireman, M.D.  Deanna Artis. Sharene Skeans, M.D.   History and Physical  DATE OF BIRTH: 05-01-1951  CHIEF COMPLAINT: Abdominal pain, nausea.  HISTORY OF PRESENT ILLNESS: Daisy [name] is a 60 year old woman, who has battled severe migraine headaches since age 44, most recently hospitalized July 07, 2001 with status migrainosus, treated with stellate ganglion block followed by Depakote, which has been successful in alleviating her headaches.  She was evaluated last week with Depakote level being somewhat high, but amylase and lipase were negative and she was asymptomatic, and watchful waiting was elected with close follow-up.  She was in her usual state of health, doing well, until a few days ago when she developed mild nausea and noted that despite drinking reasonable quantities of water her urine output had decreased, accompanied by abdominal bloating.  This morning, however, while eating a filling fatty breakfast, she developed the acute onset of excruciating epigastric pain radiating to the back, accompanied by marked nausea without vomiting, and by diarrhea.  Her abdominal distention continued, accompanied by a worsening of her chronic reflux.  She has continued to experience flatus.  There has still been no emesis.  She has a history of pancreatitis three times in the past, most recently approximately ten years ago.  Following cholecystectomy, she recalls a procedure for a questionable common bile duct stone, although details are sketchy.  She also carries a history of peptic ulcer disease as well as esophageal ulcers and has recently  been treated with Prilosec, which works better than Pepcid.  PAST MEDICAL HISTORY:  1. Pancreatitis x 3, most recently ten years ago.  2. History of migraine headaches with status migrainosus July 07, 2001,     treated with stellate ganglion block.  3. History of C2-3 fusion with ganglionectomy by Dr. Gasper Sells.  4. History of hypothyroidism.  5. History of asthma.  6. History of breast cancer, treated with lumpectomy, chemotherapy and     radiation in 1993.  7. History of total hysterectomy, cholecystectomy, and appendectomy.  8. History of multiple allergies/intolerances (IMITREX, BENADRYL, EMERGE,     THORAZINE, IV DYE, STEROIDS, REGLAN, PHENERGAN, and STADOL).  9. History of rectal bleeding with colonoscopy February 2002 indicating     small internal hemorrhoids. 10. History of fibromyalgia and chronic fatigue. 11. History of peptic ulcer disease and esophageal ulcers.  MEDICATIONS:  1. Synthroid 50 mcg p.o. q.d.  2. Depakote 1000 mg p.o. q.h.s., with 500 mg q.a.m. recently added.  3. Prilosec 40 mg p.o. q.d.  FAMILY HISTORY: Mother deceased secondary to CVA, father deceased secondary to coronary artery disease.  Two sisters, one with hypertension and coronary artery disease.  SOCIAL HISTORY: The patient is married with two children and at least one grandchild.  There is no tobacco and no alcohol history.  PHYSICAL EXAMINATION:  VITAL SIGNS: Temperature 97.4 degrees, pulse 82, blood pressure 138/91, respirations 18.  Oxygen saturation not obtained.  GENERAL: [name] is a very young-appearing, well-developed, well-nourished white female, with understandable anxiety and discomfort.  Color is good.  HEENT: Face is symmetric.  Oral mucosa is  moist.  There is some mild tenderness externally in the right submandibular anterior cervical neck region, which she attributes to complications following her stellate ganglion block, although no palpable deformity is noted.  LUNGS:  Clear to auscultation bilaterally with normal respirations.  HEART: Regular rate and rhythm with no murmurs, rubs, or gallops.  ABDOMEN: Distended, soft, quiet with very few bowel sounds, and tender in the right and left upper quadrants as well as epigastrium.  EXTREMITIES: Warm and dry, noncyanotic, with no evidence of edema.  SKIN: Smooth without lesions.  NEUROLOGIC: Mental status is depressed and anxious.  She is coherent and alert.  Moves all extremities normally.  Sensation is intact grossly to touch. Muscle tone is normal.  There is no tremor.  LABORATORY DATA: All laboratories are pending.  ASSESSMENT/PLAN:  1. Theresas symptoms of acute onset epigastric pain during a fatty meal,     following a few days history of nonspecific nausea, are concerning for     pancreatitis, which in her case may be secondary to Depakote given her     cholecystectomy.  Peptic ulcer disease or severe esophagitis may also be     considered, and both diagnoses will be empirically treated.  IV access     has been difficult, and a PICC line will be required.  IV fluids will be     administered and bowel rest excluding meds and sips and chips.  NG suction     will be provided if her nausea does not remit with medication.  Right     upper quadrant ultrasound will be obtained to rule out common bile duct     stone. Depakote level will be checked.  If lipase and amylase do indeed     indicate pancreatitis, the Depakote will need to be discontinued, at which     time Dr. Sharene Skeans will be involved in her care.  Dr. Madilyn Fireman will be     notified of the admission.  Protonix will be provided for treatment of     reflux and possible peptic ulcer disease.  2. History of difficult to control migraine headaches.  I will continue the     Depakote until I have clear indication of pancreatitis.  Level will be      obtained.  If Depakote requires discontinuation I will involve Dr.     Sharene Skeans as above. Dictated by:    Viviana Simpler, M.D. Attending Physician:  Miguel Aschoff DD:  08/20/01 TD:  08/21/01 Job: [id number]

## 2011-04-21 NOTE — Op Note (Signed)
Daisy [name], BESKE NO.:  [id number]   MEDICAL RECORD NO.:  [id number]          PATIENT TYPE:  INP   LOCATION:  3039                         FACILITY:  [facility]   PHYSICIAN:  Lorre Munroe., M.D.DATE OF BIRTH:  1950-12-28   DATE OF PROCEDURE:  10/03/2004  DATE OF DISCHARGE:                                 OPERATIVE REPORT   PREOPERATIVE DIAGNOSES:  Chronic migraine headaches with need for recurring  intravenous medications.   POSTOPERATIVE DIAGNOSIS:  Chronic migraine headaches with need for recurring  intravenous medications.   OPERATION:  Implantation of central venous access device.   SURGEON:  Lebron Conners, M.D.   ANESTHESIA:  Local with monitored anesthesia care.   PROCEDURE:  After the patient was monitored and sedated and had routine  preparation and draping of the anterior chest and neck, I infused local  anesthetic in the right anterior chest wall and area of the deltopectoral  groove.  I made a small incision just below the clavicle and sticking  medially and cephalad assessed the right subclavian vein with the needle and  attempted passage of a J-wire through.  I stuck two different locations and  was able to get good backflow of blood from the subclavian vein but could  not get the wire to advance.  I thought I either was barely in the vein or  perhaps there was a stenosis of the vein since there had been a previous  central port through the right subclavian vein.  In any case, it was a  little uncomfortable for the patient to get the access at that point, so I  thoroughly anesthetized the right lower anterolateral neck and using a very  small needle, I located the internal jugular vein, then following the course  of that needle accessed the internal jugular vein with a larger needle and  passed the J-wire into the atrium with ease.  I then created a pocket for  the port on the anterior chest wall using the site which had previously been  used, and made a pocket of adequate size to accommodate the implantable X-  Port made by [employer].  I then had the patient placed again in  Trendelenburg position and passed the dilator and introducer assembly over  the J-wire and removed the wire and confirmed intravenous position by  withdrawal of blood.  I passed the venous tubing in through the introducer  and pulled away the sheath.  I had already passed the venous tubing from the  port site to the venous access incision.  Then using fluoroscopy, I  shortened the catheter, pulling it back until the tip lay right at the  cavoatrial junction.  I then shortened the catheter and attached the port  with the provided collar making a nice connection.  It then allowed easy  withdrawal of blood and easy infusion of fluid.  I then implanted the port  with two sutures of 2-0 Prolene and it lay in a very comfortable position,  fluoroscopy showing good position of the tip of the catheter and no kinks or  twists that  might cause any obstruction of flow.  I then closed all the  incisions with intracuticular 4-0 Vicryl and Steri-Strips.  I then accessed  the port with a  right-angle Huber infusion device and again assuring good backflow of blood  and good forward flow, flushed the device with concentrated heparin  solution.  I then put a locking cap on the device and applied a bandage.  The patient was stable throughout the procedure and seemed to tolerate it  well.       WB/MEDQ  D:  10/03/2004  T:  10/03/2004  Job:  [id number]   cc:   Corinna L. Lendell Caprice, MD   Deanna Artis. Sharene Skeans, M.D.  [address]  [address]  [place]  [place] [address]  Fax: [phone]

## 2011-04-21 NOTE — Procedures (Signed)
[facility]  Patient:    Daisy [name], Daisy [name]                      MRN: [id number] Proc. Date: 01/23/01 Adm. Date:  [id number] Attending:  Louie Bun CC:         Lennis P. Darrold Span, M.D.  [name]. [name], M.D.   Procedure Report  PROCEDURES PERFORMED:  Colonoscopy.  INDICATIONS:  59 year old patient with recent onset of rectal bleeding who also has a personal history of breast cancer.  DESCRIPTION OF PROCEDURE:  The patient was placed in the left lateral decubitus position and placed on the pulse monitor with continuous low-flow oxygen delivered via nasal cannula. She was sedated with 60 mg IV Demerol and 7 mg IV Versed.  The Olympus video colonoscope was inserted into the rectum and advanced to the cecum, confirmed by transillumination of McBurneys point and visualization of the ileocecal valve and appendiceal orifice.  The prep was excellent.  The cecum, ascending, transverse, descending and sigmoid colon all appeared normal with no masses, polyps, diverticula or other mucosal abnormalities.  The the rectum likewise appeared normal and retroflexed view of the anus revealed some small internal hemorrhoids. The colonoscope was then withdrawn and the patient returned to the recovery room in stable condition.  She tolerated the procedure well and there were no immediate complications.  IMPRESSION:  Small internal hemorrhoids, otherwise normal colonoscopy.  PLAN:  Will repeat colonoscopy in five years based on her history of breast cancer. future colon screening. DD:  01/24/00 TD:  01/23/01 Job: [id number]

## 2011-04-21 NOTE — Op Note (Signed)
Daisy [name], Daisy [name]                         ACCOUNT NO.:  [id number]   MEDICAL RECORD NO.:  [id number]                   PATIENT TYPE:  OIB   LOCATION:  2899                                 FACILITY:  [facility]   PHYSICIAN:  Stefani Dama, M.D.               DATE OF BIRTH:  09-01-1951   DATE OF PROCEDURE:  03/14/2004  DATE OF DISCHARGE:                                 OPERATIVE REPORT   PREOPERATIVE DIAGNOSIS:  Spinal cord stimulator electrode malfunction.   POSTOPERATIVE DIAGNOSIS:  Spinal cord stimulator electrode malfunction.   PROCEDURE:  Revision of spinal cord stimulator electrode.   SURGEON:  Stefani Dama, M.D.   ANESTHESIA:  MAC plus local 1% lidocaine without epinephrine.   INDICATIONS:  Daisy [name] is a 60 year old individual who, about 2 months ago,  underwent implantation of a spinal cord fusion stimulator for control of  chronic intractable back pain.  She seemed to do well; however, after about  a month period of time she started developing an electrical-shocking-like  sensation in her back outside the area of catheter placement.  There was  some question after multiple testing that there was malfunction of the  catheter electrodes at the connections and she was taken back to the  operating room now to undergo exploration of the catheter connection sites  and internal diagnostic testing of the electrodes.   DESCRIPTION OF PROCEDURE:  The patient was brought to the operating room,  supine on the stretcher, after there had been induction of some sedation.  She was turned prone. She was made comfortable on appropriate pillow rolls  and the back was then prepped with duraprep and draped in a sterile fashion.  A midline incision was created in the midportion of the thoracic spine and  this was dissected down through the subcutaneous tissues to the electrodes.  Prior to starting the electrode connecters were localized radiographically  with fluoroscopic imaging.  These  were marked on the skin.   Once the dissection was taken to the subcutaneous tissues a fluid-filled  pocket was encountered that had the catheters and the electrodes and then  these were dissected free.  Under careful examination with loop  magnification it was noted that the catheters, each, appeared to have some  fluid or at least moisture within the catheters themselves.  The suture  ligatures around each of the boots was intact.  However, on moving the boots  around it was noted that there was a bit of a meniscus within the boot.  The  boots were removed.   There was, indeed, noted to be some moisture on the catheter.  This was  dried off.  The assembly was then disconnected and each of the electrodes  were tested independently and they appeared to function normally with the  same level of stimulation providing pain relief and the similar sensations  that she had experienced with relief previously.  The more proximal aspects  of the catheter and the electrodes were examined carefully.  These were  carefully dried.  New boots were applied to the spinal electrodes and the  electrodes were then connected proximal-to-distal again and the boots were  slipped over them and then tied securely with 2-0 silk sutures.  The ends  were trimmed carefully.  Care was taken to check that the boots were,  indeed, snugly secured.  Once this was verified, the distal portion of the  system itself was checked for its normal function and impedance rating and  this was felt to be intact.   The system was then closed with the incision being closed with 2-0 Vicryl in  the subcutaneous fascia, 3-0 Vicryl subcuticularly.  Dermabond was used on  the skin.  The patient tolerated the procedure well and was returned to the  recovery room in stable condition.                                               Stefani Dama, M.D.    Daisy [name]  D:  03/14/2004  T:  03/14/2004  Job:  [id number]

## 2011-04-21 NOTE — Discharge Summary (Signed)
Daisy [name], Daisy [name]               ACCOUNT NO.:  [id number]   MEDICAL RECORD NO.:  [id number]          PATIENT TYPE:  INP   LOCATION:  1441                         [facility]ACILITY:  [facility]   PHYSICIAN:  Hollice Espy, M.D.DATE OF BIRTH:  [birth year]   DATE OF ADMISSION:  05/14/2008  DATE OF DISCHARGE:  05/21/2008                               DISCHARGE SUMMARY   ADDENDUM:  The patient was in the hospital for a positive PPD and was  kept and monitored and found to have three negative acid fast bacilli  sputums, the first one being done on May 18, 2008.  After speaking to,  per standard protocol the smears were viewed for 6 weeks.  Over the  course of the weekend of June 27, 2008, the laboratory called saying  that the original sputum, the first of the three collected sputums came  back positive for Mycobacterium avium complex.  Note the other previous  sputums did not appear to be positive for these cultures.  I spoke with  Dr. Johny Sax of infectious disease and reviewed these findings  with him.  He also took note of the patient's chest CT which noted  positive subtle alveolar infiltrates in both upper lobes possibly  infectious in etiology.  Discussion was made with Dr. Ninetta Lights given the  fact that the patient had not been home for six weeks and he recommended  that while positive diagnosis could be made for MAC given the positive  CT and one sputum culture finding given the patient's really significant  lack of symptoms rather than put the patient on a course of ethambutol  and Zithromax which can be quite toxic at times for patients he  recommended that the patient could have outpatient sputums done over the  next three months and if those came back positive then she would need to  start treatment.  I have spoken with Ms. Gosa' PCP, [name], who will follow up and have the patient follow up in his office  for these smears.      Hollice Espy, M.D.  Electronically Signed     SKK/MEDQ  D:  06/28/2008  T:  06/28/2008  Job:  [id number]   cc:   [name]. [name], M.D.  Fax: [phone]

## 2011-04-21 NOTE — Discharge Summary (Signed)
Daisy [name], Daisy [name]               ACCOUNT NO.:  [id number]   MEDICAL RECORD NO.:  [id number]          PATIENT TYPE:  INP   LOCATION:  3039                         FACILITY:  [facility]   PHYSICIAN:  Theone Stanley, MD   DATE OF BIRTH:  05-29-1951   DATE OF ADMISSION:  09/26/2004  DATE OF DISCHARGE:  10/19/2004                                 DISCHARGE SUMMARY   INCOMPLETE:   PRIMARY CARE PHYSICIAN:  Dr. Janey Greaser.   ADMISSION DIAGNOSES:  1.  Intractable migraines.  2.  History of multiple back surgeries.  3.  History of hypertension.  4.  History of gastroesophageal reflux disease.  5.  Cellulitis of the left hip.   DISCHARGE DIAGNOSES:  1.  Intractable migraines.  2.  History of multiple back surgeries.  3.  History of hypertension.  4.  History of gastroesophageal reflux disease.  5.  Cellulitis of the left hip.   CONSULTATIONS:  1.  Dr. Deanna Artis. Hickling, neurology.  2.  General surgery.   SURGERY:  Placement of Port-A-Cath on September 30, 2004.   HISTORY OF PRESENT ILLNESS:  The patient is a 60 year old female with a long  history of continued intractable migraines, multiple back surgeries and  hypertension, who presents to the hospital for      [facility]   AEJ/MEDQ  D:  12/05/2004  T:  12/05/2004  Job:  [id number]   cc:   Dr. Janey Greaser

## 2011-04-21 NOTE — Op Note (Signed)
Daisy [name], Daisy [name]                         ACCOUNT NO.:  [id number]   MEDICAL RECORD NO.:  [id number]                   PATIENT TYPE:  OIB   LOCATION:  3013                                 FACILITY:  [facility]   PHYSICIAN:  Stefani Dama, M.D.               DATE OF BIRTH:  03/19/1951   DATE OF PROCEDURE:  01/21/2004  DATE OF DISCHARGE:                                 OPERATIVE REPORT   PREOPERATIVE DIAGNOSIS:  Chronic lumbar pain with radiculopathy.   POSTOPERATIVE DIAGNOSIS:  Chronic lumbar pain with radiculopathy.   OPERATION PERFORMED:  Insertion of spinal cord stimulator, [employer].   SURGEON:  Stefani Dama, M.D.   ANESTHESIA:  General endotracheal.   INDICATIONS FOR PROCEDURE:  Daisy [name] is a 60 year old individual who  has been suffering with chronic back pain and lumbar radiculopathy.  Most of  her pain is left-sided but she does get bilateral leg pain.  The patient was  noted to have multilevel disk degeneration.  She has not had any previous  surgical intervention on her lumbar spine; however, the pain has been  disabling.  She had a trial of a percutaneous spinal cord stimulator which  gave her excellent relief and she is now taken to the operating room for  surgical implantation of a permanent device.   DESCRIPTION OF PROCEDURE:  The patient was brought to the operating room  supine on a stretcher.  After smooth induction of general endotracheal  anesthesia, she was turned prone.  Her back was shaved, prepped with  DuraPrep and draped in a sterile fashion.  Fluoroscopic guidance was used to  mark the T9-10, T10-11, T11-12 interspaces to identify the midline and  identify the interspace of T10 and T11.  Then by infiltrating the skin over  this area and making a small linear incision, dissection was carried down to  the thoracodorsal fascia and the facia was opened on either side of the  midline to expose the interlaminar space at T10-T11.  The lamina and  spinous  process of T10 was then partially removed and an interlaminar space was  cleared.  The yellow ligament was taken up with a 2 and 3 mm Kerrison punch  and the dura was identified underlying this.  Then a trial of the electrode  paddle was placed into the epidural space and when this fit comfortably, the  electrodes were placed under fluoroscopic guidance in the midline up to the  superior margin of the body of T9.  Once this was placed adequately and  centered in the midline, the catheter ends were fastened with the available  adaptors to the superior interspinous ligament.  The area of the right  buttock just below the posterior superior iliac crest was then prepared and  an incision was created along the superior gluteal line.  This was incised  and then the pocket was created over the superior aspect of the  gluteal  fascia and fat.  This was tunneled into the subcutaneous space approximately  a half inch under the skin.  The pocket was made big enough to hold the  battery comfortably with the battery being placed just slightly lateral.  The leads were then connected to the appropriate adaptors and tunneled  appropriately in a subcutaneous path down to the battery pack.  The system  was connected with all screws being tightened sequentially. The battery was  then buried with the writing facing toward the skin and the remainder  of  the coils of the electrode were then placed in the subcutaneous space.  The  fascia in the thoracodorsal fascia was closed with #1 Vicryl in the  interrupted fashion, 2-0  Vicryl was used subcutaneously and 3-0 Vicryl was used subcuticularly.  The  battery pack was closed in a similar fashion.  Dermabond was placed on the  skin.  The patient tolerated the procedure well.  Blood loss was estimated  at less than 50 mL.                                               Stefani Dama, M.D.    Merla Riches  D:  01/21/2004  T:  01/22/2004  Job:  [id number]

## 2011-04-21 NOTE — Discharge Summary (Signed)
Daisy [name], Daisy [name]                         ACCOUNT NO.:  [id number]   MEDICAL RECORD NO.:  [id number]                   PATIENT TYPE:  INP   LOCATION:  0380                                 FACILITY:  [facility]   PHYSICIAN:  Melissa L. Ladona Ridgel, MD               DATE OF BIRTH:  [birth year]   DATE OF ADMISSION:  03/28/2004  DATE OF DISCHARGE:  04/13/2004                                 DISCHARGE SUMMARY   ADMITTING DIAGNOSES:  1. Status migrainous.  2. Dizziness.   DISCHARGE DIAGNOSES:  1. Status migrainous.  2. Hypothyroidism.  3. Depression.   MEDICATIONS AT THE TIME OF DISCHARGE:  1. Cymbalta 20 mg q.12h.  2. Zofran 4 mg p.o. q.4-6h. as needed.  3. Dilaudid 4 mg p.o. q.6h. as needed.  4. Synthroid 25 mcg q.a.m. once daily.  The patient was instructed to     initially hold the dosing until her TSH was repeated.   FOLLOWUP:  She was given phone numbers for counselors, Sherrill Raring, [phone], and Shanon Rosser, [phone].  She was instructed to follow up with  Dr. [name] in one week.   HISTORY OF PRESENT ILLNESS:  The patient is a 60 year old white female with  a past medical history significant for longstanding migraine headaches,  chronic lumbar back pain, and breast cancer, status post chemotherapy and  radiation in 1993.  She presented on the day of admission with two-and-a-  half weeks of headache pain with nausea and vomiting for which she visited  her primary care physician and was given IM medication, including Demerol  and Toradol to attempt to try and control her symptoms.  At the time of  admission, she was unable to keep any food down and the symptoms persisted  despite interventions in the outpatient setting.  She was therefore admitted  for failed outpatient therapy for migraine disease.   HOSPITAL COURSE:  The patient was admitted to the telemetry floor for IV  hydration after finding that she was mildly orthostatic.  Her blood pressure  was controlled with  lisinopril and she was started on pain medication, as  well as antiemetics.  She initially refused to have a CT of her head or an  MRI to follow up on the symptomatology, stating that she had been thoroughly  worked up in the past and this was fairly typical for her status migraine  disease.  By March 30, 2004, the patient was amenable to going ahead and  checking a CT of her head, which revealed no acute events.  She remained  with symptoms of dizziness on standing, weakness, pain in the head, and  changes in her vision.  She had intermittent clearing of symptomatology, but  it was short lived.  She initially refused to have neurology consult unless  it was Dr. Sharene Skeans.  So on Apr 05, 2004, after discussing the symptoms of  loss of vision of  the right eye, the patient did agree to see Dr. Sharene Skeans  to assure that the migraine had not transformed into a stroke.  Dr. Sharene Skeans  has a longstanding relationship with the patient and did see the patient.  He felt that the visual changes would resolve spontaneously as the migraine  broke.  He recommended pain control and symptomatic relief of her nausea and  vomiting, as well as rehydration.  During the course of the hospitalization,  the patient's previous diagnosis of multiple sclerosis was discussed with  the patient and a new antidepressant was started to attempt to combat the  physiological pressures influencing her possible migraine symptoms.  She  continued to respond favorably to around the clock treatment with Dilaudid  and Zofran, which was converted from an IV format to a p.o. format in an  attempt to facilitate the patient's therapy as an outpatient.  In further  working up the patient's symptomatology of hair loss, weakness, and weight  gain, a TSH, T3, and T4 were obtained, which showed decreased free T4  despite normal TSH.  This case was discussed with Dr. Lurene Shadow, who has seen  the patient in the outpatient setting for her  endocrinological issues.  The  patient had been previously treated with Synthroid, but had been removed  from therapy in the past.  It was decided after repeating the tests and  finding that the T4 was truly low and testing the hypothalamic pituitary  access for adrenal insufficiency with a cosyntropin stem test that the  patient should be treated for her hyperthyroidism.  The cosyntropin stem  test showed appropriate response of _________ to stimulation.  She was  restarted on Synthroid and continued on her Cymbalta, as well as weaned to  p.o. medications.  The patient did openly agree to receive a palliative care  consult to try to assist with the complex psychosocial, as well as medical  issues that face the patient.  Palliative care reviewed her case and made  recommendations for outpatient psychotherapy with two practitioners, whose  names were provided in her discharge summary.  They provided emotional, as  well as clinical support during the patient's hospital stay and made  recommendations for possible alternative therapies.  Unfortunately, the  patient has had previous alternative therapies, including homeopathic  interventions, all of which have been unable to break her symptomatology.  Also during the course of the hospitalization, the patient complained of  urinary dysfunction, including increased urination and at times inability to  pass urine while sitting on the toilet.  She was able to pass her urine when  standing, however.  The patient is scheduled as an outpatient to see her GYN  for work up of possible pelvic floor dysfunction.  This will be encouraged  at discharge.  At this time, there is no evidence for urinary tract  infection, so therefore we will not initiate therapy.  The patient also was  given a trial of Haldol to attempt to break her nausea.  This caused  confusion and therefore was discontinued.  During the course of hospitalization, the patient had episodic  tachycardia that was normal sinus,  possibly related to her orthostasis.  At the time of discharge, this had not  shown any persistent pattern.  The patient complained of some soft stools  with diarrhea during the course of hospitalization after being treated for  constipation.  Her stool softeners were therefore discontinued.  The patient  was open to suggestions that there were  intense psychosocial pressures  influencing her medical condition and by the end of hospitalization was able  to identify some of the stressors and was open to outpatient therapy with  recommended therapists.  The patient's case was discussed with her primary  care physician, Dr. [name], and at one point it was thought that we should  perform an interdisciplinary conference on this patient.  However, after  discussing this with the patient, she determined that she would prefer to  have an outpatient approach to the therapies and it would make her more  comfortable.  Therefore, the decision was to discharge her to home with oral  medication for the pain and nausea and to have follow up with Dr. [name].  At the time of discharge, the patient's TSH was noted to be 1.3.  However, a  repeat T4 was not completed.  She was requested to hold her Synthroid until  followup with Dr. [name].  Dr. [name] was aware of her originally normal  TSH, but low T4.  I will therefore contact him in followup to assure that he  understands the definition of her hypothyroidism is based on T4 and that the  patient is being adequately repleted and followed up by endocrinology as an  outpatient.  At the time of discharge, the patient's somatostatin and  prolactin levels were pending as further testing was deemed appropriate to  follow her hypothalamic pituitary.  These should be followed by her primary  care physician.                                               Melissa L. Ladona Ridgel, MD   MLT/MEDQ  D:  05/02/2004  T:  05/02/2004  Job:   [id number]   cc:   [name]. [name], M.D.  [address]  [place]  [place] [address]  Fax: [phone]

## 2011-04-21 NOTE — Consult Note (Signed)
Daisy [name], Daisy [name]               ACCOUNT NO.:  [id number]   MEDICAL RECORD NO.:  [id number]          PATIENT TYPE:  INP   LOCATION:  3039                         FACILITY:  [facility]   PHYSICIAN:  Deanna Artis. Hickling, M.D.DATE OF BIRTH:  07-18-1951   DATE OF CONSULTATION:  09/27/2004  DATE OF DISCHARGE:                                   CONSULTATION   REFERRING PHYSICIAN:  Dr. Cammie Mcgee L. Lendell Caprice.   CHIEF COMPLAINT:  Intractable headaches.   HISTORY OF THE PRESENT CONDITION:  The patient has been a patient of mine  for over 15 years.  She has intractable daily migraines that have been  nearly continuous over that time with some very brief respites.   Over this time, she has been diagnosed as having occipital neuralgia and has  been treated with a C2 ganglionectomy on the right side with fusion of her  neck.  This provided temporary relief and then she developed a significant  pain in the C2 dermatomal distribution which caused further detonation of  migraines.  She has been on virtually every anti-migraine medication that  has been markedly and has either not been able to tolerate them, or they  have been ineffective.  She has been tried with steroids which cause  significant total body swelling and fluid retention.  She has used  nonsteroidal anti-inflammatory agents and at one point in the late '70s, had  a gastric ulcer, so has avoided those.  More recently, she was seen in  [place], [place] and was placed on some form of balanced hormonal treatment  which offered her about 11 months of respite.  She continued to have  problems with back pain and hip pain.  Implantation of a deep stimulator  proved to be a very painful experience and the device malfunctioned and  started to shock her.  Her headaches recurred and have basically been  present since the spring of 2005.   The patient typically manages her headaches by going to the [facility], where she receives narcotic  analgesics and antiemetics.  She spends  much of her time in bed and very little time able to get up and about, and  do normal activities of daily living.   The patient was directly admitted this time because of persistent pain.  She  has a band-like stretching pain around her head with particular soreness in  the right occipital region and right trapezius.  She also has pounding pain  that is frontal, retro-orbital.  She has significant nausea and vomiting,  and has lost weight.  She has not been able to have more than a few hours of  relief from any of her injections.  Because of this, she was brought into  the hospital for D.H.E. protocol and for further neurological consultation.   The patient has had significant blurred vision and has complained of  drooping of her right eyelid.  She said that she lost her vision altogether  a couple of weeks ago, something that has happened previously, says it has  come back, she has extreme difficulty focusing her  eyes and has severe  photophobia symptoms which are definitely worse in brighter light.  She does  much better in dim light, both in terms of visual acuity and some lessened  symptoms of photophobia.   REVIEW OF SYSTEMS:  Review of systems is recorded on the chart and is  associated with fullness in her salivary glands; she feels as if they are  blocked.  She had an operation by Dr. Jeannett Senior. Rosen to open up her left  salivary gland.  She can express the salivary glands in order to push them  out; this has added discomfort to her; whether it has anything to do with  her headache pain syndrome is questionable.   The remainder of her 12-system review is negative except as noted above.   PAST MEDICAL HISTORY:  1.  The patient has had multiple back surgeries.  2.  She has hypertension.  3.  She has had breast cancer.  4.  Gastroesophageal reflux disease.   MEDICATIONS:  1.  Lisinopril 10 mg daily.  2.  Nexium 40 mg twice daily.  3.   OxyContin 40 mg twice daily.   DRUG ALLERGIES:  No specific allergies.  She is not able to take  CORTICOSTEROIDS and has had GI complaints when taking NONSTEROIDAL ANTI-  INFLAMMATORY AGENTS.  PHENERGAN has made her confused previously.   SOCIAL HISTORY:  The patient does not use tobacco or alcohol.  She is the  mother of 2, has been divorced and remarried; her husband has been extremely  supportive.   FAMILY HISTORY:  Family history is remarkable for migraines both in her  mother and in her children.  Her mother died of a cerebrovascular accident  and had other medical complications.  Father died of coronary artery  disease.   PHYSICAL EXAMINATION:  GENERAL:  On examination today, this is a well-  developed and well-nourished woman lying in bed in the darkness, speaking  very softly, in some distress.  VITAL SIGNS:  Temperature 97.1, blood pressure 124/79, resting pulse 73,  respirations 20, pulse oximetry 100%.  HEENT:  No signs of infection.  She has definite tenderness all the way  around the right orbit and in the supraorbital margin on the left, and the  eye itself on the left.  She has tenderness in both temples, right greater  than left, tenderness in the right occipital region and also right trapezius  more so than the left.  She has some tenderness in her temporomandibular  joints bilaterally.  NECK:  She has a supple neck, but it is painful for her to flex her neck  forward; she can turn it from side to side.  She does not have meningismus.  Some of the limitations of range of motion are related to her prior surgery.  LUNGS:  Lungs clear to auscultation.  HEART:  No murmurs.  Pulses normal.  ABDOMEN:  Soft, nontender.  No hepatosplenomegaly.  Bowel sounds normal.  EXTREMITIES:  Extremities are well-formed without edema, cyanosis or altered  tone.  She has a PICC line in the left arm.  NEUROLOGIC EXAMINATION:  Awake, alert, mildly lethargic, a good historian, normal  language, cognition and memory.  Cranial nerves:  Round, reactive  pupils.  She has definite photophobia.  She is able to fix and follow with  her eyes.  There appears to be a right eyelid ptosis, but I really think  that she is squinting rather than a true ptosis.  Face is symmetric  otherwise.  Midline tongue and uvula.  Air conduction greater than bone  conduction.  Motor examination:  Normal strength, tone and mass.  Good fine  motor movements.  No pronator drift.  Sensation intact to cold, vibration  and stereoagnosis.  Cerebellar examination:  Good finger-to-nose and rapid  repetitive movements.  Gait not tested.  Deep tendon reflexes symmetrically  diminished.  The patient had bilateral flexor plantar responses.   IMPRESSION:  1.  Intractable migraines, chronic daily migraine, 346.61.  2.  Cervicogenic pain, right greater than left, 723.1.  3.  Occipital neuralgia, status post C2 ganglionectomy and fusion.  4.  Cervical spondylosis.  5.  Status post breast cancer.   PLAN:  1.  D.H.E. 45 protocol with modifications.  We will use Zofran in place of      Phenergan.  2.  Heating pad to neck.  3.  PT consult to work on trigger points of her neck.  4.  We will consider consult for Dr. Allegra Grana.  I would recommend      consulting Dr. Allegra Grana concerning her block salivary ducts; this is      probably something that can wait until the end of the hospitalization.      I will treat her with indomethacin SR for allodynia.  We will need to      watch out for GI distress.  5.  I will continue to follow her along with you.  She seems to be      responding somewhat to the D.H.E. treatment.       [facility]  D:  09/27/2004  T:  09/27/2004  Job:  [id number]   cc:   Al Decant. Janey Greaser, MD  [address]  [place]  [place] [address]  Fax: [phone]

## 2011-04-21 NOTE — H&P (Signed)
NAMEMARGURETE, Daisy [name]                         ACCOUNT NO.:  [id number]   MEDICAL RECORD NO.:  [id number]                   PATIENT TYPE:  INP   LOCATION:  3027                                 FACILITY:  [facility]   PHYSICIAN:  Deanna Artis. Sharene Skeans, M.D.           DATE OF BIRTH:  November 20, 1951   DATE OF ADMISSION:  08/21/2002  DATE OF DISCHARGE:                                HISTORY & PHYSICAL   CHIEF COMPLAINT:  Headache.   HISTORY OF PRESENT CONDITION:  Almost 60 year old, right-handed, Caucasian  married woman with a history of headaches since 60 years of age. She has been  a patient of mine for the past 59 years. She has intractable migraines with  attempts to treat her with beta blockers, calcium channel blockers,  antidepressants and antiepileptic drugs. She had a C2-3 ganglionectomy  04/28/92. She had Botox injections twice, in March and in June of 2003, and  also superior cervical block in March 2003.   The patient  has had numerous MRIs of the head and neck with no clear cut  pathology with the exception of a small white matter lesion that has been  stable from MRI to MRI.   The patient has had consultations not only with myself, but also Dr. Rudene Christians, Dr. Tawanna Cooler __________, Dr. Jim Desanctis and a neurologist in  [place], [place], where she previously lived.   The patient's primary physician is Dr. Zigmund Daniel, who has supplied  periodic narcotic analgesics and antiemetics for breakthrough headaches.   Over the past several months, we have tried Botox therapy. The first  injection worked fairly well for only about 2 to 3 weeks. Second  injection  did not work at all. The patient had been on Topamax prior to that and this  had to be discontinued when she had significant breast swelling and serous  discharge from her breasts. The patient had had prior 60 malignancy in her left  breast. Topamax was discontinued and her headaches returned with a  vengeance. The patient had  limited response to the initial Botox injection,  to the superior cervical block, to the C2-3 ganglionectomy, and also  recently to primidone. Unfortunately, there has been tachyphylaxis with all  these treatments.   Currently breakthrough headaches have been present for about four weeks  continuously, abated only by Demerol, Vistaril and/or Toradol. The patient  has had 4 injections in the past week, the last 2 with no response  whatsoever.   She has responded in the past to intravenous DHE which caused some nausea  and flushing, however, her headaches did subside.   PAST MEDICAL HISTORY:  Breast cancer diagnosed on 07/13/92, chronic neck and  back pain. (Status post surgery for the C2-3 ganglionectomy, however, this  is both low and high back so she likely also has spondylosis).   CURRENT MEDICATIONS:  1. Synthroid 50 mcg per day.  2. Primidone 50 mg two in  the morning, two at midday, three at bedtime. Her     last level was in the 13 to 14 mcg/ml range at trough.  3. Celebrex 200 mg twice a day.  4. Nexium; I am not certain whether she is on 20 or 40 mg per day.   ALLERGIES:  1. IVP DYE, WHICH CAUSES ANAPHYLAXIS.  2. INTOLERANCES TO PREDNISONE, WHICH CAUSED MARKED SWELLING AND WEIGHT GAIN.  3. BENADRYL AND BENZODIAZEPINES, WHICH MAKE HER HYPERACTIVE AND JITTERY.  4. CLONAZEPAM, WHICH HELPS HER TO SLEEP BUT CAUSES DEPRESSION.   PAST SURGICAL HISTORY:  Tonsillectomy, appendectomy, hysterectomy,  cholecystectomy, four breast biopsies; three on the left, one which was  malignant, and one on the right. She has also had extensive lymph node  dissection on the left. Finally, C2-3 ganglionectomy and fusion.   FAMILY HISTORY:  Positive for migraines in her mother, two of her daughters  and her sister. Positive for cerebrovascular accident, trigeminal neuralgia  and polymyalgia rheumatica in her mother, who died at age 35 from  complications of stroke. Positive for myocardial  infarction. TB of the eyes  and systemic TB in her father, who died at age 10 of complications of heart  disease. Atherosclerotic cardiovascular disease in an older sister.   SOCIAL HISTORY:  The patient has been married for 13 years. This is a second  marriage. She has two children, ages 90 and 26. One of her daughters and her  grandson as well as her husband live with her at her home. Her husband works  outside the home. The patient has been disabled by her headaches. She does  not use tobacco or drugs. She rarely drinks alcohol.   PHYSICAL EXAMINATION:  VITAL SIGNS:  Temperature 99.1, resting pulse 114,  respirations 22, blood pressure 160/80. Pulse oximetry 95%.  GENERAL:  The patient is an awake, cooperative woman in sunglasses, sitting  upright in moderate distress.  HEENT:  No signs of infection. The patient has severe photophobia. She has  marked decreased range of motion in her neck. Her best range is flexion,  worst is extension of her head and bringing her ears toward her shoulders.  She also has difficulty bringing her chin toward her shoulder.  No cranial  or cervical bruits.  LUNGS:  Clear to auscultation.  HEART:  No murmurs. Pulses normal.  ABDOMEN:  Soft, nontender. Bowel sounds normal.  EXTREMITIES:  Well formed. She has some edema at the IM injection sites.  SKIN:  Shows bruising in the upper thighs due to injections.  NEUROLOGIC:  Mental status:  The patient is awake, alert, attentive,  appropriate. Good fund of knowledge. Normal memory. No dysphasia or  dyspraxia. Cranial  nerves:  Round, reactive pupils. Normal fundi. Full  visual fields to double simultaneous stimuli. Extraocular movements full and  conjugate. Locating end responses equal bilaterally. Visual acuity not  tested. Symmetric facial strength. Midline tongue and uvula. Air conduction greater than bone conduction bilaterally. Motor examination:  Normal  strength, tone, and fine motor movements. No  pronator drift. Sensation  intact to cold, vibration, stereognosis. Cerebellar examination:  Good  finger-to-nose and rapid repetitive movements.  Gait and station was  slightly broad based, somewhat slow. She has difficulty with tandem. She did  well on her heels and toes. Sensory examination showed decreased sensation  in the lateral femoral cutaneous, but no peripheral neuropathy and no signs  of cortical dysfunction. I should mention the patient's right eyelid is  drooped. She does not, in  my opinion, have a true Horner syndrome, but has  in the past.   IMPRESSION:  Intractable migraines, 346.11.   PLAN:  DHE protocol. See orders for further details.                                              Deanna Artis. Sharene Skeans, M.D.   [facility]  D:  08/21/2002  T:  08/23/2002  Job:  [id number]

## 2011-04-21 NOTE — H&P (Signed)
[place]  Patient:    Daisy [name], Daisy [name]                      MRN: [id number] Adm. Date:  [id number] Attending:  Lesly Dukes CC:         [name]. [name], M.D.  [facility], [address] [place], [place]   History and Physical  CHIEF COMPLAINT: Daisy [name] is a 60 year old right-handed white female, born 03/25/1951, with a history of migraine headaches, followed by Dr. Billie Ruddy.  This patient has had recent exacerbation of her headaches after being headache-free for two months.  HISTORY OF PRESENT ILLNESS: The patient has had daily headaches for two weeks that have been severe, frontal and occipital in nature, associated with significant nausea and vomiting, particularly the last three to four days. The patient has been getting daily shots through Dr. Nils Flack office and has not improved with the pain.  This patient comes to the emergency room via referral by Dr. Tery Sanfilippo for further evaluation.  The patients headaches date back several decades.  The patient in the past has undergone a C2-3 fusion with ganglionectomy by Dr. Gasper Sells that resulted in headache-free period of over two years.  The patient apparently had a syncopal episode while at Dr. Teryl Lucy office today and comes in for evaluation.  PAST MEDICAL HISTORY:  1. History of status migrainosus.  2. History of hypothyroidism.  3. History of asthma.  4. History of breast cancer.  5. History of C2-3 fusion with ganglionectomy in the past.  6. History of hysterectomy.  7. History of tonsillectomy.  8. History of gallbladder resection.  9. History of appendectomy. 10. The patient has had chemo and radiation therapy of the breast cancer in     the past.  Lumpectomy was performed.  CURRENT MEDICATIONS:  1. Synthroid 0.05 mg q.d.  2. Patient just recently started on Keppra and only took 125 mg for one or     two doses and felt that it caused  too much anxiety and nervousness.  ALLERGIES: The patient states allergy to:  1. IMITREX.  2. EMERGE.  3. STEROIDS.  4. IVP DYE.  5. REGLAN.  6. BENADRYL.  SOCIAL HISTORY: The patient does not smoke or drink.  [place] and is [place].  The patient has two children, both with migraines.  FAMILY HISTORY: Notable for the fact that both parents have passed away. Mother died with stroke.  Father died with coronary artery disease.  The patient has two sisters.  One sister has heart disease, hypertension.  REVIEW OF SYSTEMS: Notable for no recent fever or chills.  The patient does note headaches and does note some occasional shortness of breath, wheezing. Denies chest pain, diarrhea, problems controlling bowel or bladder.  The patient did have a blackout episode today while at Dr. Teryl Lucy office.  The patient claims that with her current headaches she is getting some occasional numbness in the left side of the face, left arm at times.  The patient does note some slight neck stiffness and some tongue numbness as well.  PHYSICAL EXAMINATION:  VITAL SIGNS: Blood pressure is 180/98, heart rate is 90, respiratory rate 16, temperature afebrile.  GENERAL: This patient is a fairly well-developed white female, who is alert and cooperative at the time of examination.  HEENT: Head atraumatic.  Eyes, PERRL.  Discs flat bilaterally.  NECK: Supple.  No carotid bruits noted.  RESPIRATORY: Notable for bilateral wheezes, particularly posteriorly.  CARDIOVASCULAR: Regular rate and rhythm.  No obvious murmurs or rubs noted.  ABDOMEN: Positive bowel sounds.  No tenderness.  EXTREMITIES: Without significant edema.  NEUROLOGIC: Cranial nerves as above.  Facial symmetric present.  The patient has good sensation in face to pinprick and soft touch bilaterally.  The patient appears to have some slight decrease in pinprick sensation in the left face  compared to the right and on the forehead, not on the lower face.  The patient has normal speech pattern, full visual fields.  Extraocular movements were full.  No aphasia seen.  Motor testing reveals 5/5 strength in all fours. Good symmetric motor tone is noted throughout.  Sensory testing intact to pinprick, soft touch, and vibratory sensation throughout.  The patient has fair finger-to-nose-to-finger and finger-to-toe-to-finger bilaterally.  The patient is not ambulated.  Again, deep tendon reflexes are symmetric and normal.  Toes are downgoing bilaterally.  LABORATORY DATA: Notable for WBC of 5.5, hemoglobin 14.9, hematocrit 43.3; MCV 94.2; platelets 225,000.  Sodium 138, potassium 4.6, chloride 108, CO2 25, glucose 96, BUN 28, creatinine 0.7.  Calcium 8.7, total protein 6.8, albumin 3.8.  AST 32, ALT 25.  Alkaline phosphatase 62, total bilirubin 0.6.  Chest x-ray and EKG pending.  IMPRESSION: History of migraine headaches with current status migrainosus. This patient has significant ongoing headaches that have become very severe the last three to four days.  The patient will be admitted for further evaluation.  PLAN:  1. Will gain IV access for IV fluids normal saline 125 mg an hour.  2. MRI scan of the brain.  3. MR angiogram.  4. DHE-45 protocol.  5. Use of Depacon and analgesics for pain.  6. Will follow the patients clinical course while in-house. DD:  07/07/01 TD:  07/08/01 Job: [id number]

## 2011-04-21 NOTE — Discharge Summary (Signed)
[place]  Patient:    Daisy [name], Daisy [name] Visit Number: [id number] MRN: [id number]          Service Type: MED Location: [id number] Attending Physician:  Melvyn Novas Dictated by:   Deanna Artis. Sharene Skeans, M.D. Admit Date:  05/20/2002 Discharge Date: 06/04/2002   CC:         Levonne Lapping, M.D.  Louanne Belton, M.D., [facility]                           Discharge Summary  FINAL DIAGNOSES: 1. Intractable migraine headaches with persistent visual aura (346.01). 2. Hypothyroidism. 3. Gastroesophagealreflux disease. 4. Osteopenia. 5. Status post breast cancer with mastectomy.  PROCEDURES: 1. MRI of brain without and with contrast on May 20, 2002. 2. Ophthalmology consult.  COMPLICATIONS: None.  SUMMARY OF HOSPITALIZATION: The patient is a 60 year old right handed Caucasian woman with a long-standing history of intractable migraines.  The patient has been treated in the course of her therapy with surgical procedure to remove her C2 ganglion and fuse her neck.  She has been under multiple different medication regimens that were prophylactic including a variety of anti-epileptic drugs, Depakote, Topamax, Zonegran, antidepressant drugs, both norepinephrine, serotonin re-uptake inhibitors, beta blocker drugs, calcium channel blocker drugs, a variety of aborted therapies. She has has either had little response to these or has had very significant side effects. These include marked weight gain with Depakote, enlargement of her other breast with Topamax which caused great concern among her gynecologist, dyskinesias with Zonegran.  She has been on prednisone which caused marked weight gain but also altered mental status. Mostrecently, she has been treated with botulinum toxin in cervical neck because of presumed cervicogenic triggers to her headaches.  After about a one month period in the first injection she did  well for a couple of months and then symptoms recurred.  She received a second injection during a time when she was having very significant visual auras and had marked exacerbation of her symptoms.  She came into the hospital with problems of loss of vision, however, the loss of vision was related to visual auras rather than any primary disease of her eyes.  MRI scan of the brain showed a 3 to 4 mm tonsillar ectopia which was not truly a Chiari malformation, normal ventricles, mild increase in signal in the periventricular andsubcortical white matter bilaterally that may represent ischemic demyelination related to atherosclerotic disease, hypertension, diabetes or vasculitis.  There were no acute strokes noted.  The orbital region was studied with fat saturated T1 weighted images and no abnormalities of the optic nerve, orbital musculature, globe or intracranial optic pathways could be seen.  The patient was treated initially with Zonegran and developed dyskinesias after a couple of days. She was treated also with first intramuscular and then intravenous narcotic analgesics and one time required 100 mg every four hours in order to obtain relief.  We have gradually tapered her dose down to a point where she was able to go without the medication over the last 12 hours. Simultaneously, we have started her on Mysoline at a dose of initially 50 mg twice a day, increased to 75 mg twicea day.  Though she is a bit sleepy, visual auras are improved (as they were with Zonegran) and headache has reached a tolerable level (5/10). Accordingly, we are planning to discharge her today.  PHYSICAL EXAMINATION:  VITAL SIGNS:  Blood pressure 132/69, resting pulse 89, respirations 20, temperature 97.4, pulse oximetry 95%.  HEENT:  No signs of infection.  She does have tenderness in her neck, tenderness around her temporal region.  Neck shows decreased range of motion following her fusion.  LUNGS:   Clear to auscultation.  HEART:  No murmurs.  PULSES:  Normal.  ABDOMEN:  Soft and nontender.  EXTREMITIES:  Well formed without edema, cyanosis or altered tone.  NEUROLOGIC EXAMINATION:  Mental status:  Awake, alert, pleasant, oriented, no dysphagia or dyspraxia.  Cranial nerves:  Round, reactive pupils.   Normal fundi.  Full visual fields to double simultaneous stimuli.  Extraocular movements full and conjugate, okay, responsive and equal bilaterally.  Visual acuity was not tested today but is affected significantly by her visual auras. Visual fields are full to count fingers.  Sensation, facial strength midline, tongue, uvula normal. Motor examination:  Normal strength,tone and mass. Good fine motor movements.  No pronator drift.  Sensation intact to cold, vibration and stereoagnosis.  Cerebellar examination:  Good finger to nose, rapid alternating movements, no tremor detected or dysmetria.  Gait and station were normal.  The patient was able to walk onher heels and toes and perform tandem without difficulty.  Deep tendon reflexes are symmetric and diminished.  DISCHARGE MEDICATIONS: 1. Mysoline 50 mg one and a half twice daily. 2. Synthroid 50 mcg oncedaily. 3. Protonix 40 mg one daily. 4. Tums one three times a day with meals. 5. Diazepam 2 mg one at bedtime.  SPECIAL INSTRUCTIONS: The patient should continue Mysoline.  She should use warm neck compresses, bed rest and we will not administer regular narcotic analgesics.  DIET:  Regular.  FOLLOWUP: 1. The patient will return to see me in one month as a work-in. She is to    call [phone].  Prescriptions were issued today for Mysoline 50 mg #100,    diazepam 2 mg #31, Mysoline is refillable and diazepam is not.  We will    refill it as needed. 2. She is to follow up with Dr. [name] as needed. 3. Follow up with Dr. Arbie Cookey for further botox injections. Dictated by:   Deanna Artis. Sharene Skeans, M.D.  Attending Physician:   Melvyn Novas DD:  06/04/02 TD:  06/06/02 Job: [id number]

## 2011-05-31 ENCOUNTER — Encounter (HOSPITAL_BASED_OUTPATIENT_CLINIC_OR_DEPARTMENT_OTHER): Payer: BC Managed Care – PPO | Admitting: Oncology

## 2011-05-31 DIAGNOSIS — D0590 Unspecified type of carcinoma in situ of unspecified breast: Secondary | ICD-10-CM

## 2011-06-26 ENCOUNTER — Emergency Department (HOSPITAL_COMMUNITY): Payer: BC Managed Care – PPO

## 2011-06-26 ENCOUNTER — Observation Stay (HOSPITAL_COMMUNITY)
Admission: EM | Admit: 2011-06-26 | Discharge: 2011-06-28 | Disposition: A | Discharge status: Home / Self Care | Payer: BC Managed Care – PPO | Attending: Internal Medicine | Admitting: Internal Medicine

## 2011-06-26 DIAGNOSIS — R42 Dizziness and giddiness: Secondary | ICD-10-CM | POA: Insufficient documentation

## 2011-06-26 DIAGNOSIS — E039 Hypothyroidism, unspecified: Secondary | ICD-10-CM | POA: Insufficient documentation

## 2011-06-26 DIAGNOSIS — R110 Nausea: Secondary | ICD-10-CM | POA: Insufficient documentation

## 2011-06-26 DIAGNOSIS — G43909 Migraine, unspecified, not intractable, without status migrainosus: Secondary | ICD-10-CM | POA: Insufficient documentation

## 2011-06-26 DIAGNOSIS — M549 Dorsalgia, unspecified: Secondary | ICD-10-CM | POA: Insufficient documentation

## 2011-06-26 DIAGNOSIS — K219 Gastro-esophageal reflux disease without esophagitis: Secondary | ICD-10-CM | POA: Insufficient documentation

## 2011-06-26 DIAGNOSIS — R0602 Shortness of breath: Secondary | ICD-10-CM | POA: Insufficient documentation

## 2011-06-26 DIAGNOSIS — I10 Essential (primary) hypertension: Secondary | ICD-10-CM | POA: Insufficient documentation

## 2011-06-26 DIAGNOSIS — G8929 Other chronic pain: Secondary | ICD-10-CM | POA: Insufficient documentation

## 2011-06-26 DIAGNOSIS — Z9010 Acquired absence of unspecified breast and nipple: Secondary | ICD-10-CM | POA: Insufficient documentation

## 2011-06-26 DIAGNOSIS — IMO0002 Reserved for concepts with insufficient information to code with codable children: Secondary | ICD-10-CM | POA: Insufficient documentation

## 2011-06-26 DIAGNOSIS — R1013 Epigastric pain: Principal | ICD-10-CM | POA: Insufficient documentation

## 2011-06-26 DIAGNOSIS — E669 Obesity, unspecified: Secondary | ICD-10-CM | POA: Insufficient documentation

## 2011-06-26 DIAGNOSIS — R0789 Other chest pain: Secondary | ICD-10-CM | POA: Insufficient documentation

## 2011-06-26 DIAGNOSIS — E2749 Other adrenocortical insufficiency: Secondary | ICD-10-CM | POA: Insufficient documentation

## 2011-06-26 LAB — URINALYSIS, ROUTINE W REFLEX MICROSCOPIC
Leukocytes, UA: NEGATIVE
Nitrite: NEGATIVE
Specific Gravity, Urine: 1.025 (ref 1.005–1.030)
pH: 6.5 (ref 5.0–8.0)

## 2011-06-26 LAB — CARDIAC PANEL(CRET KIN+CKTOT+MB+TROPI): Relative Index: 3.2 — ABNORMAL HIGH (ref 0.0–2.5)

## 2011-06-26 LAB — GLUCOSE, CAPILLARY: Glucose-Capillary: 143 mg/dL — ABNORMAL HIGH (ref 70–99)

## 2011-06-26 MED ORDER — XENON XE 133 GAS
10.0000 | GAS_FOR_INHALATION | Freq: Once | RESPIRATORY_TRACT | Status: AC | PRN
  Administered 2011-06-26: 10 via RESPIRATORY_TRACT

## 2011-06-26 MED ORDER — TECHNETIUM TO 99M ALBUMIN AGGREGATED
6.0000 | Freq: Once | INTRAVENOUS | Status: AC | PRN
  Administered 2011-06-26: 6 via INTRAVENOUS

## 2011-06-27 LAB — PROTIME-INR: INR: 1.02 (ref 0.00–1.49)

## 2011-06-27 LAB — CBC
HCT: 34.4 % — ABNORMAL LOW (ref 36.0–46.0)
Hemoglobin: 10.9 g/dL — ABNORMAL LOW (ref 12.0–15.0)
MCH: 23.9 pg — ABNORMAL LOW (ref 26.0–34.0)
MCHC: 31.7 g/dL (ref 30.0–36.0)
MCV: 75.3 fL — ABNORMAL LOW (ref 78.0–100.0)
RDW: 17.1 % — ABNORMAL HIGH (ref 11.5–15.5)

## 2011-06-27 LAB — BASIC METABOLIC PANEL
BUN: 13 mg/dL (ref 6–23)
CO2: 27 mEq/L (ref 19–32)
Chloride: 100 mEq/L (ref 96–112)
Glucose, Bld: 128 mg/dL — ABNORMAL HIGH (ref 70–99)
Potassium: 3.7 mEq/L (ref 3.5–5.1)
Sodium: 138 mEq/L (ref 135–145)

## 2011-06-27 LAB — GLUCOSE, CAPILLARY
Glucose-Capillary: 116 mg/dL — ABNORMAL HIGH (ref 70–99)
Glucose-Capillary: 124 mg/dL — ABNORMAL HIGH (ref 70–99)
Glucose-Capillary: 134 mg/dL — ABNORMAL HIGH (ref 70–99)

## 2011-06-27 LAB — DIFFERENTIAL
Basophils Absolute: 0 10*3/uL (ref 0.0–0.1)
Eosinophils Relative: 1 % (ref 0–5)
Lymphocytes Relative: 19 % (ref 12–46)
Monocytes Absolute: 0.6 10*3/uL (ref 0.1–1.0)
Monocytes Relative: 6 % (ref 3–12)

## 2011-06-27 LAB — CARDIAC PANEL(CRET KIN+CKTOT+MB+TROPI)
CK, MB: 3.1 ng/mL (ref 0.3–4.0)
CK, MB: 2.3 ng/mL (ref 0.3–4.0)
Total CK: 62 U/L (ref 7–177)
Troponin I: <0.3 ng/mL (ref <0.30)
Troponin I: <0.3 ng/mL (ref <0.30)

## 2011-06-27 LAB — D-DIMER, QUANTITATIVE: D-Dimer, Quant: 0.59 ug/mL-FEU — ABNORMAL HIGH (ref 0.00–0.48)

## 2011-06-27 LAB — LIPID PANEL: LDL Cholesterol: 100 mg/dL — ABNORMAL HIGH (ref 0–99)

## 2011-06-28 ENCOUNTER — Observation Stay (HOSPITAL_COMMUNITY): Payer: BC Managed Care – PPO

## 2011-06-28 LAB — CBC
HCT: 35.9 % — ABNORMAL LOW (ref 36.0–46.0)
Hemoglobin: 11.2 g/dL — ABNORMAL LOW (ref 12.0–15.0)
RDW: 17 % — ABNORMAL HIGH (ref 11.5–15.5)
WBC: 8.3 10*3/uL (ref 4.0–10.5)

## 2011-06-28 LAB — BASIC METABOLIC PANEL
CO2: 31 mEq/L (ref 19–32)
GFR calc non Af Amer: >60 mL/min (ref >60)
Glucose, Bld: 92 mg/dL (ref 70–99)
Potassium: 3.6 mEq/L (ref 3.5–5.1)
Sodium: 138 mEq/L (ref 135–145)

## 2011-06-28 LAB — GLUCOSE, CAPILLARY

## 2011-06-28 MED ORDER — TECHNETIUM TC 99M TETROFOSMIN IV KIT
30.0000 | PACK | Freq: Once | INTRAVENOUS | Status: AC | PRN
  Administered 2011-06-28: 30 via INTRAVENOUS

## 2011-06-28 MED ORDER — TECHNETIUM TC 99M TETROFOSMIN IV KIT
10.0000 | PACK | Freq: Once | INTRAVENOUS | Status: AC | PRN
  Administered 2011-06-28: 10 via INTRAVENOUS

## 2011-06-29 ENCOUNTER — Other Ambulatory Visit (HOSPITAL_COMMUNITY): Payer: BC Managed Care – PPO

## 2011-06-29 ENCOUNTER — Encounter (HOSPITAL_COMMUNITY): Payer: BC Managed Care – PPO | Attending: Internal Medicine

## 2011-06-30 NOTE — Consult Note (Signed)
NAMEWAVERLEY, Daisy [name] NO.:  [id number]  MEDICAL RECORD NO.:  [id number]  LOCATION:  2021                         FACILITY:  [facility]  PHYSICIAN:  Jake Bathe, MD      DATE OF BIRTH:  [birth year]  DATE OF CONSULTATION:  06/27/2011 DATE OF DISCHARGE:                                CONSULTATION   REQUESTING PHYSICIAN:  Lonia Blood, MD, [facility].  REASON FOR CONSULTATION:  Evaluation of chest pain.  HISTORY OF PRESENT ILLNESS:  This is a 60 year old female without any complex past medical history including Addison disease, mixed migraines, chronic pain disorder, seen neurologist; history of MRSA scalp infection; GERD; pancreatitis; pseudo-wheeze evaluated by Dr. Sherene Sires; obesity; fibromyalgia; appendectomy; cholecystectomy; hysterectomy; mastectomy on the left who has been having chest discomfort.  Today, she was supposed to be in the outpatient setting, evaluated by Dr. Eldridge Dace, but she has not.  She was here in the hospital.  She does not have a previous cardiologist.  Her GI physician currently is [name] and her primary physician is Dr. Duane Lope.  She describes this chest pressure feeling like "someone standing on her chest," pressure-like sensation, moderate to severe in intensity.  When she does feel the discomfort, she states that her face turns white as a ghost and she becomes diaphoretic and almost has to go down to the floor in order to feel better.  She had an experience at the ophthalmologist after she got up, went to the bathroom, came back and was sitting talking with one of the optical technicians.  Pain came on sudden, was first to the right chest wall, then spread across her chest wall, went up her neck bilaterally, down the left arm and stopped at her elbow, was very severe, then went away.  These episodes do not occur in the midst of heavy physical exertion, but they always seemed to occur after she has completed walking.   On other words, these do not occur at complete rest without any provocation.  She does have signs of nausea with this as well and she feels presyncopal.  These episodes are occurring approximately 3-4 times a month over the past 3 months.  There has been no recent change her medications or any other recent illnesses.  PAST MEDICAL HISTORY:  As described above, quite extensive.  MEDICATIONS:  At home, she is taking Nexium, Xopenex, Synthroid 75 mcg once a day, Symbicort, prednisone 10 mg a day, oxycodone, morphine, clonazepam, Hyzaar for blood pressure 100/12.5 mg 1 tablet every morning which is losartan and hydrochlorothiazide, and amlodipine 5 mg every morning.  She does not have aspirin on her list.  ALLERGIES:  Multiple including: 1. REGLAN. 2. CONTRAST. 3. GLUCOCORTICOID. 4. IMITREX. 5. PHENERGAN. 6. KETOROLAC. 7. LAMISIL. 8. SPORANOX. 9. STADOL.  No food allergies.  Once again, she does state she had an IV DYE allergy.  FAMILY HISTORY:  Her father had his first heart attack at age 62 and died of congestive heart failure at 45.  Her mother had a stroke in her 80s.  SOCIAL HISTORY:  She denies any prior history of smoking.  She is married.  Her friend, Toniann Fail  takes her to several appointments.  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  She has 2 healthy children. No alcohol use.  REVIEW OF SYSTEMS:  Denies any bleeding issues.  No recent fevers.  She does have presyncope as described above, but no frank syncope.  No palpitations.  Unless specified above, all other 12 review of systems negative.  PHYSICAL EXAMINATION:  VITAL SIGNS:  Blood pressure 129/80-143/79, saturating 97% on room air, temperature is 98.7, pulse is 88-100, and respirations of 12. GENERAL:  She is alert and oriented x3, overweight female, in no acute distress. EYES:  Well-perfused conjunctivae.  EOMI.  No scleral icterus. NECK:   Supple.  No lymphadenopathy.  No thyromegaly.  No carotid bruits. No JVD. CARDIOVASCULAR:  Regular rate and rhythm without any appreciable murmurs, rubs, or gallops.  Normal PMI. LUNGS:  Clear to auscultation bilaterally.  Normal respiratory effort. No wheezes nor rales. ABDOMEN:  Obese.  Positive bowel sounds.  Nontender.  No bruits. EXTREMITIES:  No clubbing, cyanosis, or edema.  Trivial pretibial edema. Pulses are palpable bilaterally in upper and lower extremities. GU:  Deferred. RECTAL:  Deferred. NEUROLOGIC:  Cranial nerves II-XII grossly intact. PSYCH:  Appears to have normal affect.  LABORATORY DATA:  V/Q scan was low risk.  EKG personally reviewed shows sinus rhythm, rate 89 with no other ST-segment changes.  Hemoglobin A1c is 6.8.  Cardiac biomarkers are negative x3.  D-dimer was 0.59, slightly elevated.  V/Q scan was negative as above or low risk.  LDL cholesterol is 100, triglycerides are 137, HDL was 48, and total cholesterol 175. Chest x-ray personally reviewed shows no acute pulmonary change. Echocardiogram from December 2010 shows normal pumping function, EF of 60-65% with grade 1 diastolic dysfunction.  ASSESSMENT/PLAN:  This is a 60 year old female with multiple medical issues including Addison disease with recent onset of atypical chest discomfort. 1. Atypical chest pain - certainly could be GI etiology such as     esophageal spasm which Dr. Madilyn Fireman will be evaluating further,     however, I do agree that evaluation of her heart with a nuclear     stress test makes good sense to evaluate for any possibility of     ischemia.  Thankfully, her cardiac biomarkers as well as EKG are     unremarkable showing no evidence of any current ischemia.  Nuclear     stress test will be helpful in a pharmacologic fashion to detect     the risk and ischemia.  We will go ahead and set this up for her,     n.p.o. past midnight, and have this done tomorrow in the hospital     setting.   I would rather get it done here than discharge her for an     outpatient because she was going to be seen in our cardiology     office today, however, had another episode of chest pain which sent     her into the emergency room.  Her prior EKGs are all similar and     unremarkable. 2. Chronic pain syndrome, fibromyalgia - certainly could also be     playing an etiology in her syndrome as well. 3. Presyncope - with her pallor, sweating accompanying this sensation     certainly a vagal component or vasovagal component could be     evident.  Maintain good hydration especially with her Addison     disease.  4. Gastroesophageal reflux disease.  High dose PPI, continue.  Dr.     Madilyn Fireman is monitoring closely. 5. Hypothyroidism, controlled. 6. Addison disease, prednisone.     Jake Bathe, MD     MCS/MEDQ  D:  06/27/2011  T:  06/28/2011  Job:  [id number]  cc:   Everardo All. Madilyn Fireman, M.D. Magnus Sinning) Tenny Craw, M.D. Lonia Blood, M.D.  Electronically Signed by Donato Schultz MD on 06/30/2011 06:06:20 AM

## 2011-07-01 NOTE — H&P (Signed)
Daisy [name], Daisy [name] NO.:  [id number]  MEDICAL RECORD NO.:  [id number]  LOCATION:  [facility]                         FACILITY:  [facility]  PHYSICIAN:  Lonia Blood, M.D.DATE OF BIRTH:  January 22, 1951  DATE OF ADMISSION:  06/26/2011 DATE OF DISCHARGE:                             HISTORY & PHYSICAL   PRIMARY CARE PHYSICIAN:  Duane Lope, M.D.  GI PHYSICIAN:  Everardo All. Madilyn Fireman, M.D.  CHIEF COMPLAINT:  Chest pressure.  HISTORY OF PRESENT ILLNESS:  [name] is a 60 year old female with a very complex past medical history.  She presented to the hospital with a 70-month history of intermittent substernal chest pressure.  The patient was scheduled with an appointment to see Dr. Lance Muss with [facility] tomorrow.  She decided instead to come to the emergency room for evaluation tonight when her pain hit her today.  She states that her pain is more severe than it usually has and that concerned her.  She describes the pain as a "feeling like someone standing on my chest."  It is oriented directly underneath the sternum.  It does not have any identifiable precipitating factors.  It has occurred during exertion and is also occurred while at rest.  It does not appear to be associated with food or liquid intake per her history.  It is usually associated with severe diaphoresis, severe sense of nausea, and occasional episodes of presyncopal type symptoms.  She does not feel that this occurred any more frequently over the last few days, but states that it does appear to be occurring with more severity than previous episodes.  She states that she cannot predict how often it would happen and at times it is only once every few weeks and sometimes it is multiple times a week.  There has been no recent changes in her medication and no recent distant travel.  REVIEW OF SYSTEMS:  The patient has multiple positive elements on her review of systems, but these all  appear to be connected to her multiple chronic medical problems and therefore will not be detailed at this time in the interest of brevity.  PAST MEDICAL HISTORY: 1. Possible Addison disease, but clear adrenal insufficiency of some     nature followed by Endocrinology at [facility] per her report. 2. History of mixed migraine headaches with occipital neuralgia     followed by Dr. Lesia Sago at [facility] and     on chronic narcotic therapy. 3. History of MRSA scalp infection. 4. Hypothyroidism. 5. Gastroesophageal reflux disease. 6. Reported history of pancreatitis. 7. Pseudoasthma/pseudo wheeze previously evaluated by Dr. Sandrea Hughs. 8. Obesity. 9. Reported history of fibromyalgia. 10.Complex history of left breast cancer originally diagnosed in 1993     status post mastectomy with subsequent followup surgeries required. 11.Total abdominal hysterectomy. 12.Appendectomy. 13.Cholecystectomy. 14.Hyperglycemia, likely related to steroids. 15.Chronic low back pain with multiple previous surgeries and history     of a spinal stimulator followed in the pain clinic.  OUTPATIENT MEDICATIONS:  All reviewed as per the med rec form.  ALLERGIES:  The patients reports an anaphylactic reaction to IV dyes. She also reports allergies to  STADOL, LAMISIL, LISINOPRIL, PHENERGAN, and REGLAN.  FAMILY HISTORY:  Noncontributory.  SOCIAL HISTORY:  The patient does not smoke or drink.  She is married. She has two healthy children.  DATA REVIEWED:  D-dimer is mildly elevated at 0.59.  A V/Q scan is reported as "very low risk of PE."  Her 12-lead EKG reveals a normal sinus rhythm at 97 beats per minute with no acute ST or T-wave change. Chest x-ray reveals no acute disease.  BMET is unremarkable.  Serum glucose is elevated at 128.  Hemoglobin is low at 10.9 with an MCV of 75, but white count and platelet count are normal.  INR is 1.02. Troponin is less than 0.3.  CK is 82, MB  is 3.3.  PHYSICAL EXAMINATION:  VITAL SIGNS:  Temperature 98.4, blood pressure 120/65, heart rate 95, respiratory rate 16, O2 sats 98% on room air. GENERAL:  Obese female in no acute respiratory distress. HEENT:  Normocephalic, atraumatic.  Pupils are equal, round, reactive to light and accommodation.  Extraocular muscles are intact bilaterally. OC/OP clear. NECK:  No JVD. LUNGS:  Poor air movement in bases bilaterally, but no wheeze and no focal crackles appreciable. ABDOMEN:  Overweight, soft bowel sounds present.  No organomegaly, no rebound, no ascites. EXTREMITIES:  1+ bilateral lower extremity edema without cyanosis or clubbing. NEUROLOGIC:  Nonfocal neurologic exam. CUTANEOUS:  Multiple areas in the central portion of the scalp consistent with a probable superficial staph infection.  IMPRESSION AND PLAN: 1. Chest pressure - the patient apparently has been seen [name] with [facility] for evaluation of these symptoms.  There is     concern that this could represent esophageal spasm.  Nonetheless,     it was felt most appropriate to rule out angina prior to pursuing     an esophageal diagnosis.  As a result, Dr. Madilyn Fireman had set the     patient up to see Dr. Lance Muss tomorrow, June 27, 2011.     Unfortunately, the patient suffered a recurrent episode of symptoms     tonight and chose to report to the emergency room.  In the     emergency room, a set of cardiac enzymes has been negative and her     EKG is without acute changes.  Her symptoms have essentially     resolved at this time.  I do not feel a full anticoagulation is     indicated at the present time.  Nitrates would not be wise as the     patient states that they greatly exacerbate her severe headaches     that she suffers with.  As a result, I will simply monitor the     patient without change in her medication regimen.  We will cycle     cardiac enzymes and check a 12-lead EKG in the morning.  If her      findings are negative, we will consider either discharging her in     time to make her outpatient appointment or calling an inpatient     [facility] consult to allow the patient to be seen as an     inpatient further determination can be made on risk stratification. 2. Reported history of Addison disease - we will continue the     patient's chronic steroid therapy. 3. Hyperglycemia - the patient does not have a known history of     diabetes.  I suspect her hyperglycemia due to her chronic  steroid     use.  We will follow her CBG is and administer sliding scale     insulin as indicated. 4. History of methicillin-resistant Staphylococcus aureus scalp     infection - the patient is followed chronically for this in the     outpatient setting.  Most of the areas now appear to be scabbed     over and healing well.  I do not feel that antibiotic therapy is     indicated at present time.  We will follow the patient's exam and     assure that she does not develop erythema or purulent discharge.     Nonetheless, she will need contact isolation. 5. Chronic pain - the patient has a history of chronic pain     originating from multiple sources.  She is followed by the pain     clinic with Dr. Vear Clock.  We will continue her outpatient pain     regimen, but not make any changes to include additions or     subtractions to her chronic regimen. 6. Hypothyroidism - we will continue the patient's Synthroid therapy.     Lonia Blood, M.D.     JTM/MEDQ  D:  06/26/2011  T:  06/26/2011  Job:  [id number]  cc:   Magnus Sinning) Tenny Craw, M.D. John C. Madilyn Fireman, M.D.  Electronically Signed by Jetty Duhamel M.D. on 07/01/2011 04:32:33 PM

## 2011-07-03 NOTE — Discharge Summary (Signed)
NAMEMARLAYSIA, LENIG               ACCOUNT NO.:  [id number]  MEDICAL RECORD NO.:  [id number]  LOCATION:  2021                         FACILITY:  [facility]  PHYSICIAN:  Lonia Blood, M.D.       DATE OF BIRTH:  07/15/1951  DATE OF ADMISSION:  06/26/2011 DATE OF DISCHARGE:  06/28/2011                              DISCHARGE SUMMARY   PRIMARY CARE PHYSICIAN:  C. Duane Lope, MD with [facility].  DISCHARGE DIAGNOSES: 1. Epigastric pain and retrosternal pain most likely due to esophageal     spasm. 2. Hypertension. 3. History of migraines. 4. Gastroesophageal reflux disease. 5. Hypothyroidism. 6. Obesity. 7. Adrenal insufficiency on chronic prednisone therapy as directed by     endocrinologist at [facility]. 8. Status post hysterectomy, appendectomy, and cholecystectomy. 9. History of left breast cancer status post mastectomy. 10.Chronic back pain, followed in the Pain Clinic.  DISCHARGE MEDICATIONS: 1. Amlodipine 5 mg daily. 2. Benadryl 25 mg 2 tablets 3 times a day as needed. 3. Calcium and vitamin D 600 mg/400 units 1 tablet 3 times a day. 4. Klonopin 1 mg 3 times a day. 5. Hyzaar 100/12.5 one tablet daily. 6. Morphine sulfate 60 mg by mouth every 4 hours needed for pain. 7. Nexium 40 mg twice a day. 8. Nifedipine 10 mg half a tablet every 6 hours as needed for     esophageal spasms. 9. Omeprazole 40 mg twice a day after the Nexium prescription runs     out. 10.OxyContin 10 mg every 12 hours. 11.Prednisone 10 mg daily. 12.Retin-A cream daily. 13.Symbicort 1 mL/4.5 two puffs twice a day. 14.Synthroid 75 mcg daily. 15.Triamcinolone topical twice a day. 16.Vistaril 50 mg every 4 hours as needed. 17.Vitamin B1 250 mg daily. 18.Vitamin B2 100 mg twice a day. 19.Xopenex inhaled 2 puffs every 4-6 hours as needed.  CONDITION ON DISCHARGE:  The patient was discharged in good condition. She will follow up with Dr. Madilyn Fireman from Gastroenterology.  She will also follow up with her primary  care physician Dr. Tenny Craw.  PROCEDURE DURING THIS ADMISSION: 1. The patient underwent a ventilation-perfusion scan on June 26, 2011, showing very low probability of pulmonary embolus. 2. June 28, 2011, Myoview perfusion scan showing no evidence for     inducible perfusion defect.  CONSULTATION DURING THIS ADMISSION:  The patient was seen in consultation by Dr. Anne Fu from [facility].  HISTORY AND PHYSICAL:  Refer to dictated H and P done by Dr. Jetty Duhamel on June 26, 2011.  HOSPITAL COURSE:  Ms. Saksa is a 60 year old woman with multiple medical problems presented to the hospital with some atypical chest pain.  She was placed on telemetry observation.  She had three sets of cardiac enzymes overnight with troponin I less than 0.30 x3.  She had a D-dimer checked at 0.59.  She was observed overnight, had a ventilation- perfusion scan with very low probability for PE.  On the next day, she was seen in consultation by Dr. Anne Fu from Cardiology who felt that she should have a stress test while in the hospital.  On June 28, 2011, the patient underwent a Myoview perfusion stress test, which was negative  for inducible ischemia.  Ms. Severtson symptoms are most consistent with esophageal spasm, gastrology reflux disease.  We have advised the patient to continue high-dose proton pump inhibitor therapy at home, continue her as needed Tums, and we also prescribed low-dose nifedipine for esophageal spasm.     Lonia Blood, M.D.     SL/MEDQ  D:  06/29/2011  T:  06/30/2011  Job:  [id number]  cc:   Magnus Sinning) Tenny Craw, M.D. John C. Madilyn Fireman, M.D.  Electronically Signed by Lonia Blood M.D. on 07/03/2011 05:35:36 PM

## 2011-07-10 ENCOUNTER — Other Ambulatory Visit: Payer: Self-pay | Admitting: Gastroenterology

## 2011-08-24 LAB — COMPREHENSIVE METABOLIC PANEL
ALT: 21
AST: 25
CO2: 29
Calcium: 8.4
Chloride: 98
GFR calc Af Amer: >60
GFR calc non Af Amer: >60
Sodium: 136
Total Bilirubin: 0.4

## 2011-08-24 LAB — I-STAT 8, (EC8 V) (CONVERTED LAB)
BUN: 32 — ABNORMAL HIGH
Glucose, Bld: 127 — ABNORMAL HIGH
Potassium: 4.5
TCO2: 29
pH, Ven: 7.48 — ABNORMAL HIGH

## 2011-08-24 LAB — POCT I-STAT CREATININE: Creatinine, Ser: 1

## 2011-08-24 LAB — CBC
Platelets: 202
WBC: 7.2

## 2011-08-24 LAB — POCT CARDIAC MARKERS
Myoglobin, poc: 67.8
Operator id: 151321

## 2011-08-24 LAB — DIFFERENTIAL
Lymphocytes Relative: 30
Lymphs Abs: 2.1
Neutrophils Relative %: 61

## 2011-08-28 LAB — BASIC METABOLIC PANEL
BUN: 15
BUN: 16
CO2: 29
CO2: 31
CO2: 31
CO2: 32
Calcium: 8.1 — ABNORMAL LOW
Calcium: 8.2 — ABNORMAL LOW
Calcium: 8.2 — ABNORMAL LOW
Calcium: 8.3 — ABNORMAL LOW
Chloride: 103
Chloride: 98
Creatinine, Ser: 0.84
Creatinine, Ser: 0.95
Creatinine, Ser: 1.36 — ABNORMAL HIGH
GFR calc Af Amer: 49 — ABNORMAL LOW
GFR calc Af Amer: >60
GFR calc non Af Amer: 40 — ABNORMAL LOW
GFR calc non Af Amer: 50 — ABNORMAL LOW
GFR calc non Af Amer: >60
Glucose, Bld: 109 — ABNORMAL HIGH
Glucose, Bld: 113 — ABNORMAL HIGH
Glucose, Bld: 115 — ABNORMAL HIGH
Potassium: 4.9
Sodium: 135
Sodium: 139

## 2011-08-28 LAB — CULTURE, BLOOD (ROUTINE X 2)
Culture: NO GROWTH
Culture: NO GROWTH

## 2011-08-28 LAB — CBC
HCT: 30.3 — ABNORMAL LOW
HCT: 31.9 — ABNORMAL LOW
Hemoglobin: 10.6 — ABNORMAL LOW
MCHC: 32.3
MCHC: 33.1
MCHC: 33.3
MCHC: 33.3
MCV: 81.4
MCV: 82.2
MCV: 82.7
Platelets: 221
Platelets: 226
Platelets: 278
RBC: 3.84 — ABNORMAL LOW
RBC: 3.89
RDW: 15
RDW: 15
RDW: 15.3
WBC: 6.4
WBC: 8.1

## 2011-08-28 LAB — COMPREHENSIVE METABOLIC PANEL
AST: 19
Albumin: 3.3 — ABNORMAL LOW
Calcium: 8.7
Chloride: 105
Creatinine, Ser: 0.88
GFR calc Af Amer: >60
Total Protein: 6.6

## 2011-08-28 LAB — URINALYSIS, ROUTINE W REFLEX MICROSCOPIC
Bilirubin Urine: NEGATIVE
Hgb urine dipstick: NEGATIVE
Nitrite: NEGATIVE
Protein, ur: NEGATIVE
Specific Gravity, Urine: 1.009
Urobilinogen, UA: 0.2

## 2011-08-28 LAB — URINE CULTURE: Special Requests: NEGATIVE

## 2011-08-31 LAB — CBC
HCT: 30.9 — ABNORMAL LOW
HCT: 31.3 — ABNORMAL LOW
HCT: 31.4 — ABNORMAL LOW
HCT: 31.5 — ABNORMAL LOW
HCT: 32.4 — ABNORMAL LOW
Hemoglobin: 10 — ABNORMAL LOW
Hemoglobin: 10.3 — ABNORMAL LOW
Hemoglobin: 10.3 — ABNORMAL LOW
Hemoglobin: 10.3 — ABNORMAL LOW
Hemoglobin: 10.5 — ABNORMAL LOW
Hemoglobin: 11 — ABNORMAL LOW
MCHC: 32.2
MCHC: 32.6
MCHC: 32.7
MCHC: 31.7
MCHC: 32.2
MCHC: 32.3
MCV: 79.6
MCV: 79.9
MCV: 80.1
MCV: 80.3
MCV: 80.5
MCV: 81.1
Platelets: 234
Platelets: 240
Platelets: 67 — ABNORMAL LOW
Platelets: 240
RBC: 4.01
RDW: 18.7 — ABNORMAL HIGH
RDW: 18.8 — ABNORMAL HIGH
RDW: 18.9 — ABNORMAL HIGH
RDW: 19.7 — ABNORMAL HIGH
RDW: 19.3 — ABNORMAL HIGH
RDW: 19.4 — ABNORMAL HIGH
WBC: 10.1
WBC: 9.4
WBC: 8.8

## 2011-08-31 LAB — DIFFERENTIAL
Basophils Absolute: 0
Lymphocytes Relative: 14
Lymphocytes Relative: 20
Lymphs Abs: 1.5
Monocytes Absolute: 0.6
Monocytes Relative: 6
Monocytes Relative: 7
Neutro Abs: 6.9
Neutro Abs: 8.4 — ABNORMAL HIGH
Neutrophils Relative %: 73
Neutrophils Relative %: 79 — ABNORMAL HIGH

## 2011-08-31 LAB — COMPREHENSIVE METABOLIC PANEL
ALT: 14
ALT: 14
AST: 15
Albumin: 2.9 — ABNORMAL LOW
Albumin: 3 — ABNORMAL LOW
Alkaline Phosphatase: 68
Alkaline Phosphatase: 70
BUN: 13
BUN: 14
BUN: 15
BUN: 15
BUN: 15
BUN: 13
Calcium: 8.6
Calcium: 8.6
Calcium: 8.7
Calcium: 8.8
Chloride: 102
Chloride: 106
Creatinine, Ser: 0.71
Creatinine, Ser: 0.87
Creatinine, Ser: 0.87
Creatinine, Ser: 0.94
Creatinine, Ser: 0.84
Creatinine, Ser: 0.85
GFR calc Af Amer: >60
GFR calc non Af Amer: >60
GFR calc non Af Amer: >60
Glucose, Bld: 128 — ABNORMAL HIGH
Glucose, Bld: 139 — ABNORMAL HIGH
Glucose, Bld: 159 — ABNORMAL HIGH
Glucose, Bld: 164 — ABNORMAL HIGH
Glucose, Bld: 180 — ABNORMAL HIGH
Glucose, Bld: 100 — ABNORMAL HIGH
Glucose, Bld: 87
Potassium: 4.2
Potassium: 5.4 — ABNORMAL HIGH
Sodium: 137
Sodium: 138
Total Bilirubin: 0.4
Total Bilirubin: 0.4
Total Protein: 6
Total Protein: 6
Total Protein: 6.2
Total Protein: 6.4
Total Protein: 6.4

## 2011-08-31 LAB — WOUND CULTURE

## 2011-08-31 LAB — CK TOTAL AND CKMB (NOT AT ARMC)
CK, MB: 1.5
CK, MB: 1.3
Relative Index: INVALID
Relative Index: INVALID
Relative Index: INVALID
Relative Index: INVALID
Relative Index: INVALID
Total CK: 27
Total CK: 28
Total CK: 40

## 2011-08-31 LAB — CARDIAC PANEL(CRET KIN+CKTOT+MB+TROPI)
Relative Index: INVALID
Troponin I: <0.01

## 2011-08-31 LAB — URINE CULTURE

## 2011-08-31 LAB — AFB CULTURE WITH SMEAR (NOT AT ARMC)
Acid Fast Smear: NONE SEEN
Acid Fast Smear: NONE SEEN

## 2011-08-31 LAB — DIC (DISSEMINATED INTRAVASCULAR COAGULATION)PANEL
D-Dimer, Quant: 1.39 — ABNORMAL HIGH
INR: 1.1
Prothrombin Time: 14
aPTT: 26

## 2011-08-31 LAB — PROTIME-INR
INR: 1.1
Prothrombin Time: 14.1

## 2011-08-31 LAB — URINALYSIS, ROUTINE W REFLEX MICROSCOPIC
Ketones, ur: NEGATIVE
Nitrite: NEGATIVE
Protein, ur: NEGATIVE

## 2011-08-31 LAB — CULTURE, BLOOD (ROUTINE X 2): Culture: NO GROWTH

## 2011-08-31 LAB — BLOOD GAS, ARTERIAL
Drawn by: 103701
FIO2: 0.21
pCO2 arterial: 46.7 — ABNORMAL HIGH
pH, Arterial: 7.36

## 2011-08-31 LAB — HIV ANTIBODY (ROUTINE TESTING W REFLEX): HIV: NONREACTIVE

## 2011-08-31 LAB — EXPECTORATED SPUTUM ASSESSMENT W GRAM STAIN, RFLX TO RESP C

## 2011-08-31 LAB — IRON AND TIBC: TIBC: 291

## 2011-08-31 LAB — ROCKY MTN SPOTTED FVR AB, IGM-BLOOD: RMSF IgM: 0.1 IV

## 2011-08-31 LAB — FERRITIN: Ferritin: 16 (ref 10–291)

## 2011-08-31 LAB — RETICULOCYTES
RBC.: 3.89
Retic Count, Absolute: 58.4

## 2011-08-31 LAB — HEMOGLOBIN A1C: Hgb A1c MFr Bld: 6.2 — ABNORMAL HIGH

## 2011-08-31 LAB — TROPONIN I: Troponin I: 0.01

## 2011-08-31 LAB — CULTURE, RESPIRATORY W GRAM STAIN

## 2011-08-31 LAB — B-NATRIURETIC PEPTIDE (CONVERTED LAB)
Pro B Natriuretic peptide (BNP): 58
Pro B Natriuretic peptide (BNP): 75.7

## 2011-08-31 LAB — C-REACTIVE PROTEIN: CRP: 1.4 — ABNORMAL HIGH (ref <0.6)

## 2011-08-31 LAB — DIRECT ANTIGLOBULIN TEST (NOT AT ARMC): DAT, IgG: NEGATIVE

## 2011-08-31 LAB — OCCULT BLOOD X 1 CARD TO LAB, STOOL: Fecal Occult Bld: NEGATIVE

## 2011-08-31 LAB — ANA: Anti Nuclear Antibody(ANA): NEGATIVE

## 2011-08-31 LAB — FOLATE: Folate: 8.3

## 2011-08-31 LAB — TSH: TSH: 0.434

## 2011-09-08 ENCOUNTER — Emergency Department (HOSPITAL_COMMUNITY)
Admission: EM | Admit: 2011-09-08 | Discharge: 2011-09-08 | Disposition: A | Discharge status: Home / Self Care | Payer: BC Managed Care – PPO | Attending: Emergency Medicine | Admitting: Emergency Medicine

## 2011-09-08 DIAGNOSIS — Z0389 Encounter for observation for other suspected diseases and conditions ruled out: Secondary | ICD-10-CM | POA: Insufficient documentation

## 2011-09-14 ENCOUNTER — Other Ambulatory Visit: Payer: Self-pay | Admitting: Family Medicine

## 2011-09-14 DIAGNOSIS — R52 Pain, unspecified: Secondary | ICD-10-CM

## 2011-09-14 DIAGNOSIS — R609 Edema, unspecified: Secondary | ICD-10-CM

## 2011-09-15 ENCOUNTER — Ambulatory Visit
Admission: RE | Admit: 2011-09-15 | Discharge: 2011-09-15 | Disposition: A | Discharge status: Home / Self Care | Payer: BC Managed Care – PPO | Source: Ambulatory Visit | Attending: Family Medicine | Admitting: Family Medicine

## 2011-09-15 DIAGNOSIS — R609 Edema, unspecified: Secondary | ICD-10-CM

## 2011-09-15 DIAGNOSIS — R52 Pain, unspecified: Secondary | ICD-10-CM

## 2011-10-16 ENCOUNTER — Other Ambulatory Visit: Payer: Self-pay | Admitting: Family Medicine

## 2011-10-16 DIAGNOSIS — R509 Fever, unspecified: Secondary | ICD-10-CM

## 2011-10-19 ENCOUNTER — Ambulatory Visit
Admission: RE | Admit: 2011-10-19 | Discharge: 2011-10-19 | Disposition: A | Discharge status: Home / Self Care | Payer: BC Managed Care – PPO | Source: Ambulatory Visit | Attending: Family Medicine | Admitting: Family Medicine

## 2011-10-19 DIAGNOSIS — R509 Fever, unspecified: Secondary | ICD-10-CM

## 2011-10-20 ENCOUNTER — Emergency Department (HOSPITAL_COMMUNITY): Payer: BC Managed Care – PPO

## 2011-10-20 ENCOUNTER — Emergency Department (HOSPITAL_COMMUNITY)
Admission: EM | Admit: 2011-10-20 | Discharge: 2011-10-20 | Disposition: A | Discharge status: Home / Self Care | Payer: BC Managed Care – PPO | Attending: Emergency Medicine | Admitting: Emergency Medicine

## 2011-10-20 ENCOUNTER — Encounter: Payer: Self-pay | Admitting: Adult Health

## 2011-10-20 DIAGNOSIS — R51 Headache: Secondary | ICD-10-CM | POA: Insufficient documentation

## 2011-10-20 DIAGNOSIS — R109 Unspecified abdominal pain: Secondary | ICD-10-CM | POA: Insufficient documentation

## 2011-10-20 DIAGNOSIS — I10 Essential (primary) hypertension: Secondary | ICD-10-CM | POA: Insufficient documentation

## 2011-10-20 DIAGNOSIS — R10819 Abdominal tenderness, unspecified site: Secondary | ICD-10-CM | POA: Insufficient documentation

## 2011-10-20 DIAGNOSIS — E119 Type 2 diabetes mellitus without complications: Secondary | ICD-10-CM | POA: Insufficient documentation

## 2011-10-20 DIAGNOSIS — R112 Nausea with vomiting, unspecified: Secondary | ICD-10-CM | POA: Insufficient documentation

## 2011-10-20 DIAGNOSIS — R509 Fever, unspecified: Secondary | ICD-10-CM | POA: Insufficient documentation

## 2011-10-20 HISTORY — DX: Zoster without complications: B02.9

## 2011-10-20 HISTORY — DX: Essential (primary) hypertension: I10

## 2011-10-20 HISTORY — DX: Primary adrenocortical insufficiency: E27.1

## 2011-10-20 LAB — DIFFERENTIAL
Basophils Absolute: 0 10*3/uL (ref 0.0–0.1)
Basophils Relative: 1 % (ref 0–1)
Eosinophils Absolute: 0.1 10*3/uL (ref 0.0–0.7)
Eosinophils Relative: 2 % (ref 0–5)
Neutrophils Relative %: 64 % (ref 43–77)

## 2011-10-20 LAB — COMPREHENSIVE METABOLIC PANEL
ALT: 16 U/L (ref 0–35)
AST: 20 U/L (ref 0–37)
Albumin: 3.7 g/dL (ref 3.5–5.2)
Calcium: 9.8 mg/dL (ref 8.4–10.5)
GFR calc Af Amer: >90 mL/min (ref >90)
Potassium: 3.3 mEq/L — ABNORMAL LOW (ref 3.5–5.1)
Sodium: 135 mEq/L (ref 135–145)
Total Protein: 7.5 g/dL (ref 6.0–8.3)

## 2011-10-20 LAB — CBC
MCH: 25.2 pg — ABNORMAL LOW (ref 26.0–34.0)
MCV: 79.7 fL (ref 78.0–100.0)
Platelets: 342 10*3/uL (ref 150–400)
RDW: 17.4 % — ABNORMAL HIGH (ref 11.5–15.5)

## 2011-10-20 LAB — AMYLASE: Amylase: 123 U/L — ABNORMAL HIGH (ref 0–105)

## 2011-10-20 LAB — URINALYSIS, ROUTINE W REFLEX MICROSCOPIC
Bilirubin Urine: NEGATIVE
Glucose, UA: NEGATIVE mg/dL
Nitrite: NEGATIVE
Specific Gravity, Urine: 1.022 (ref 1.005–1.030)
pH: 6.5 (ref 5.0–8.0)

## 2011-10-20 MED ORDER — SODIUM CHLORIDE 0.9 % IV BOLUS (SEPSIS)
1000.0000 mL | Freq: Once | INTRAVENOUS | Status: AC
  Administered 2011-10-20: 1000 mL via INTRAVENOUS

## 2011-10-20 MED ORDER — HYDROMORPHONE HCL PF 1 MG/ML IJ SOLN
1.0000 mg | Freq: Once | INTRAMUSCULAR | Status: AC
  Administered 2011-10-20: 1 mg via INTRAVENOUS
  Filled 2011-10-20: qty 1

## 2011-10-20 MED ORDER — ONDANSETRON HCL 4 MG/2ML IJ SOLN
4.0000 mg | Freq: Once | INTRAMUSCULAR | Status: AC
  Administered 2011-10-20: 4 mg via INTRAVENOUS
  Filled 2011-10-20: qty 2

## 2011-10-20 MED ORDER — ONDANSETRON HCL 4 MG PO TABS: 4.0000 mg | ORAL_TABLET | Freq: Four times a day (QID) | ORAL | Status: AC

## 2011-10-20 MED ORDER — ONDANSETRON HCL 4 MG/2ML IJ SOLN
4.0000 mg | Freq: Once | INTRAMUSCULAR | Status: AC
Start: 2011-10-20 — End: 2011-10-20
  Administered 2011-10-20: 4 mg via INTRAVENOUS
  Filled 2011-10-20: qty 2

## 2011-10-20 NOTE — ED Provider Notes (Signed)
History     CSN: [id number] Arrival date & time: 10/20/2011  1:45 PM   First MD Initiated Contact with Patient 10/20/11 1524      Chief Complaint  Patient presents with  . Pancreatitis  . Herpes Zoster    (Consider location/radiation/quality/duration/timing/severity/associated sxs/prior treatment) Patient is a 60 y.o. female presenting with abdominal pain. The history is provided by the patient.  Abdominal Pain The primary symptoms of the illness include abdominal pain, nausea and vomiting.  pt was sent here by Dr. Alfredia Ferguson for an elevated Lipase in the 200's. Pt states she had had high fevers, vomiting, coughing up phlegm, abdominal pain and "feeling sick". She denies chest pain, weakness, SOB and diarrhea.  Past Medical History  Diagnosis Date  . Asthma   . Cancer   . Addison's disease   . Diabetes mellitus   . Hypertension   . Thyroid disease   . Shingles     Past Surgical History  Procedure Date  . Mastectomy   . Cholecystectomy   . Appendectomy   . Breast lumpectomy   . Abdominal hysterectomy     History reviewed. No pertinent family history.  History  Substance Use Topics  . Smoking status: Never Smoker   . Smokeless tobacco: Not on file  . Alcohol Use: No    OB History    Grav Para Term Preterm Abortions TAB SAB Ect Mult Living                  Review of Systems  Gastrointestinal: Positive for nausea, vomiting and abdominal pain.  All other systems reviewed and are negative.    Allergies  Compazine; Diuretic; Ivp dye; Lamisil; Lisinopril; Phenergan; Reglan; Sulfa antibiotics; and Tussin  Home Medications   Current Outpatient Rx  Name Route Sig Dispense Refill  . AMLODIPINE BESYLATE 5 MG PO TABS Oral Take 5 mg by mouth daily.      . B COMPLEX PO TABS Oral Take 1 tablet by mouth daily.      Marland Kitchen BENZONATATE 200 MG PO CAPS Oral Take 200 mg by mouth 3 (three) times daily as needed. cough     . BUDESONIDE-FORMOTEROL FUMARATE 160-4.5 MCG/ACT IN AERO  Inhalation Inhale 2 puffs into the lungs 2 (two) times daily.      Marland Kitchen CALTRATE 600+D PO Oral Take 1 tablet by mouth 3 (three) times daily.      Marland Kitchen CLONAZEPAM 1 MG PO TABS Oral Take 1 mg by mouth 3 (three) times daily.      . CYCLOBENZAPRINE HCL 10 MG PO TABS Oral Take 5-10 mg by mouth 3 (three) times daily as needed. Muscle spasms     . ESOMEPRAZOLE MAGNESIUM 40 MG PO CPDR Oral Take 40 mg by mouth 2 (two) times daily.      . OMEGA-3 FATTY ACIDS 1000 MG PO CAPS Oral Take 1 g by mouth daily.      Marland Kitchen HYDROXYZINE HCL 50 MG PO TABS Oral Take 50 mg by mouth 3 (three) times daily.      Marland Kitchen LEVALBUTEROL TARTRATE 45 MCG/ACT IN AERO Inhalation Inhale 2 puffs into the lungs every 4 (four) hours as needed. Shortness of breath.     Marland Kitchen LEVOTHYROXINE SODIUM 75 MCG PO TABS Oral Take 75 mcg by mouth daily.      Marland Kitchen LOSARTAN POTASSIUM-HCTZ 100-12.5 MG PO TABS Oral Take 1 tablet by mouth daily.      . MORPHINE SULFATE 30 MG PO TABS Oral Take 60 mg by  mouth 3 (three) times daily as needed. Pain.     Marland Kitchen NIFEDIPINE 10 MG PO CAPS Oral Take 10 mg by mouth 3 (three) times daily.      Marland Kitchen PREDNISONE 5 MG PO TABS Oral Take 5 mg by mouth daily.      Marland Kitchen VALACYCLOVIR HCL 1 G PO TABS Oral Take 1,000 mg by mouth 2 (two) times daily.        BP 153/77  Pulse 92  Temp(Src) 98.3 F (36.8 C) (Oral)  Resp 18  SpO2 90%  Physical Exam  Nursing note and vitals reviewed. Constitutional: She appears well-developed and well-nourished.  HENT:  Head: Normocephalic and atraumatic.  Eyes: Conjunctivae are normal. Pupils are equal, round, and reactive to light.  Neck: Trachea normal, normal range of motion and full passive range of motion without pain. Neck supple.  Cardiovascular: Normal rate, regular rhythm and normal pulses.   Pulmonary/Chest: Effort normal and breath sounds normal. Chest wall is not dull to percussion. She exhibits no tenderness, no crepitus, no edema, no deformity and no retraction.  Abdominal: Soft. Normal appearance and  bowel sounds are normal. There is tenderness (tenderness to the LUQ and to the midepigastric area.).  Musculoskeletal: Normal range of motion.  Lymphadenopathy:       Head (right side): No submental, no submandibular, no tonsillar, no preauricular, no posterior auricular and no occipital adenopathy present.       Head (left side): No submental, no submandibular, no tonsillar, no preauricular, no posterior auricular and no occipital adenopathy present.    She has no cervical adenopathy.    She has no axillary adenopathy.  Neurological: She is alert. She has normal strength.  Skin: Skin is warm, dry and intact.  Psychiatric: She has a normal mood and affect. Her speech is normal and behavior is normal. Judgment and thought content normal. Cognition and memory are normal.    ED Course  Procedures (including critical care time)  Labs Reviewed  CBC - Abnormal; Notable for the following:    Hemoglobin 11.4 (*)    MCH 25.2 (*)    RDW 17.4 (*)    All other components within normal limits  COMPREHENSIVE METABOLIC PANEL - Abnormal; Notable for the following:    Potassium 3.3 (*)    Chloride 95 (*)    Glucose, Bld 137 (*)    Total Bilirubin 0.2 (*)    All other components within normal limits  LIPASE, BLOOD - Abnormal; Notable for the following:    Lipase 8 (*)    All other components within normal limits  AMYLASE - Abnormal; Notable for the following:    Amylase 123 (*)    All other components within normal limits  DIFFERENTIAL  URINALYSIS, ROUTINE W REFLEX MICROSCOPIC  URINE CULTURE   Dg Chest 2 View  10/20/2011  *RADIOLOGY REPORT*  Clinical Data: Cough, fever, headache  CHEST - 2 VIEW  Comparison: None.  Findings: Normal cardiac silhouette and mediastinal contours given scoliotic curvature of the thoracolumbar spine. No focal parenchymal opacities.  No pleural effusion or pneumothorax. Spinal stimulator lead tips within the mid aspect of the thoracic spine.  Lower cervical ACDF.  Left  axillary surgical clips.  IMPRESSION: No acute cardiopulmonary disease.  Original Report Authenticated By: Waynard Reeds, M.D.   Ct Abdomen Pelvis W Contrast  10/20/2011  *RADIOLOGY REPORT*  Clinical Data: Left upper quadrant and epigastric pain  CT ABDOMEN AND PELVIS WITHOUT CONTRAST  Technique:  Multidetector CT imaging of  the abdomen and pelvis was performed following the standard protocol without intravenous contrast.  Comparison: None.  Findings: Neural stimulator partly visualized.  Lung bases are clear.  S-shaped scoliosis of the thoracolumbar spine noted.  Cholecystectomy clips noted.  The pancreas is fatty replaced.  No peripancreatic stranding or fluid collection is identified.  No pancreatic ductal dilatation.  Unenhanced liver, adrenal glands, and left kidney are normal.  6 mm nonobstructing right mid renal calculus identified image 35.  No radiopaque left renal or bilateral ureteral calculus.  Bladder is normal.  No ascites or lymphadenopathy.  Appendix not identified but no secondary evidence for acute appendicitis is noted.  Colon and small bowel are normal.  No acute osseous finding.  IMPRESSION: No acute intra-abdominal or pelvic pathology.  Original Report Authenticated By: Harrel Lemon, M.D.     No diagnosis found.    MDM  No EKG. CT scan of abdomen is normal.- no pancreatitis.  While in ED, pt had no fever or vomiting. Lipase is 8, chest xray negative.      Dorthula Matas, PA 10/20/11 2049

## 2011-10-20 NOTE — ED Notes (Signed)
Pt resting on stretcher. Pt drinking contrast. Pt with c/o nausea. Pt cont to await further dispo. Pt denies any needs or concerns. Will cont to monitor

## 2011-10-20 NOTE — ED Notes (Signed)
Sees Dr. Ihor Dow for fever of 102.5, Amylase was high. Sent here from [facility] to r/o pancreatitis.

## 2011-10-20 NOTE — ED Notes (Signed)
WGN:[id number]<HY> Expected date:10/20/11<BR> Expected time: 2:44 PM<BR> Means of arrival:<BR> Comments:<BR> Triage

## 2011-10-20 NOTE — ED Notes (Signed)
Pt given discharge instructions and verbalizes understanding  

## 2011-10-20 NOTE — ED Notes (Signed)
Pt from ct. PT requesting more pain and nausea meds. MD made aware.

## 2011-10-20 NOTE — ED Notes (Signed)
Patient transported to CT 

## 2011-10-21 NOTE — ED Provider Notes (Signed)
Medical screening examination/treatment/procedure(s) were performed by non-physician practitioner and as supervising physician I was immediately available for consultation/collaboration.   Marleigh Kaylor L Viann Nielson, MD 10/21/11 0012 

## 2011-10-23 LAB — URINE CULTURE: Culture  Setup Time: 201211162322

## 2011-10-24 NOTE — ED Notes (Signed)
+   Urine Chart sent to EDP office for review. 

## 2011-10-25 NOTE — ED Notes (Addendum)
Rx for Nitrofurantoin 100 mg 1 po BID x 7 days #14 written by Rob Hickman PAC need to be called to pharmacy.

## 2011-10-28 NOTE — ED Notes (Signed)
Message left to call flow manager's number 

## 2011-11-21 ENCOUNTER — Emergency Department (HOSPITAL_COMMUNITY)
Admission: EM | Admit: 2011-11-21 | Discharge: 2011-11-21 | Disposition: A | Discharge status: Home / Self Care | Payer: BC Managed Care – PPO | Attending: Emergency Medicine | Admitting: Emergency Medicine

## 2011-11-21 ENCOUNTER — Encounter (HOSPITAL_COMMUNITY): Payer: Self-pay | Admitting: *Deleted

## 2011-11-21 ENCOUNTER — Emergency Department (HOSPITAL_COMMUNITY): Payer: BC Managed Care – PPO

## 2011-11-21 DIAGNOSIS — I10 Essential (primary) hypertension: Secondary | ICD-10-CM | POA: Insufficient documentation

## 2011-11-21 DIAGNOSIS — E079 Disorder of thyroid, unspecified: Secondary | ICD-10-CM | POA: Insufficient documentation

## 2011-11-21 DIAGNOSIS — M549 Dorsalgia, unspecified: Secondary | ICD-10-CM | POA: Insufficient documentation

## 2011-11-21 DIAGNOSIS — E2749 Other adrenocortical insufficiency: Secondary | ICD-10-CM | POA: Insufficient documentation

## 2011-11-21 DIAGNOSIS — R110 Nausea: Secondary | ICD-10-CM | POA: Insufficient documentation

## 2011-11-21 DIAGNOSIS — E119 Type 2 diabetes mellitus without complications: Secondary | ICD-10-CM | POA: Insufficient documentation

## 2011-11-21 DIAGNOSIS — R109 Unspecified abdominal pain: Secondary | ICD-10-CM

## 2011-11-21 DIAGNOSIS — R1013 Epigastric pain: Secondary | ICD-10-CM | POA: Insufficient documentation

## 2011-11-21 LAB — COMPREHENSIVE METABOLIC PANEL
ALT: 18 U/L (ref 0–35)
AST: 18 U/L (ref 0–37)
Albumin: 3.6 g/dL (ref 3.5–5.2)
Alkaline Phosphatase: 101 U/L (ref 39–117)
Calcium: 9.4 mg/dL (ref 8.4–10.5)
GFR calc Af Amer: >90 mL/min (ref >90)
Potassium: 4.2 mEq/L (ref 3.5–5.1)
Sodium: 138 mEq/L (ref 135–145)
Total Protein: 7.5 g/dL (ref 6.0–8.3)

## 2011-11-21 LAB — URINALYSIS, ROUTINE W REFLEX MICROSCOPIC
Bilirubin Urine: NEGATIVE
Hgb urine dipstick: NEGATIVE
Nitrite: NEGATIVE
Protein, ur: NEGATIVE mg/dL
Specific Gravity, Urine: 1.013 (ref 1.005–1.030)
Urobilinogen, UA: 0.2 mg/dL (ref 0.0–1.0)

## 2011-11-21 LAB — DIFFERENTIAL
Basophils Absolute: 0 10*3/uL (ref 0.0–0.1)
Basophils Relative: 0 % (ref 0–1)
Eosinophils Absolute: 0.2 10*3/uL (ref 0.0–0.7)
Eosinophils Relative: 2 % (ref 0–5)
Lymphs Abs: 1.4 10*3/uL (ref 0.7–4.0)
Neutrophils Relative %: 79 % — ABNORMAL HIGH (ref 43–77)

## 2011-11-21 LAB — CBC
MCH: 24.3 pg — ABNORMAL LOW (ref 26.0–34.0)
MCHC: 30 g/dL (ref 30.0–36.0)
MCV: 80.9 fL (ref 78.0–100.0)
Platelets: 326 10*3/uL (ref 150–400)
RBC: 4.08 MIL/uL (ref 3.87–5.11)
RDW: 17.2 % — ABNORMAL HIGH (ref 11.5–15.5)

## 2011-11-21 MED ORDER — ONDANSETRON HCL 4 MG/2ML IJ SOLN
4.0000 mg | Freq: Once | INTRAMUSCULAR | Status: AC
  Administered 2011-11-21: 4 mg via INTRAVENOUS
  Filled 2011-11-21: qty 2

## 2011-11-21 MED ORDER — SODIUM CHLORIDE 0.9 % IV SOLN
999.0000 mL | INTRAVENOUS | Status: DC
  Administered 2011-11-21 (×2): 1000 mL via INTRAVENOUS

## 2011-11-21 MED ORDER — MORPHINE SULFATE 4 MG/ML IJ SOLN
4.0000 mg | Freq: Once | INTRAMUSCULAR | Status: AC
  Administered 2011-11-21: 4 mg via INTRAVENOUS
  Filled 2011-11-21: qty 1

## 2011-11-21 MED ORDER — PANTOPRAZOLE SODIUM 40 MG IV SOLR
40.0000 mg | Freq: Once | INTRAVENOUS | Status: AC
  Administered 2011-11-21: 40 mg via INTRAVENOUS
  Filled 2011-11-21: qty 40

## 2011-11-21 NOTE — ED Provider Notes (Signed)
History     CSN: [id number] Arrival date & time: 11/21/2011 10:26 AM   First MD Initiated Contact with Patient 11/21/11 1235      Chief Complaint  Patient presents with  . Abdominal Pain  . Back Pain    (Consider location/radiation/quality/duration/timing/severity/associated sxs/prior treatment) Patient is a 60 y.o. female presenting with abdominal pain and back pain. The history is provided by the patient and medical records.  Abdominal Pain The primary symptoms of the illness include abdominal pain and nausea. The primary symptoms of the illness do not include fever, shortness of breath, vomiting, diarrhea or dysuria.  Additional symptoms associated with the illness include back pain. Symptoms associated with the illness do not include chills or constipation.  Back Pain  Associated symptoms include abdominal pain. Pertinent negatives include no chest pain, no fever, no headaches and no dysuria.   the patient is a 60 year old, female, with a history of Addison's disease, and pancreatitis, and peptic ulcer disease, who presents to the emergency department complaining of epigastric pain, bloating, nausea.  She denies cough, or shortness of breath.  She denies urinary tract symptoms.  She has not had diarrhea.  She denies blood in her emesis.  She does not drink alcohol and she has had a cholecystectomy in the past.  Past Medical History  Diagnosis Date  . Asthma   . Cancer   . Addison's disease   . Diabetes mellitus   . Hypertension   . Thyroid disease   . Shingles     Past Surgical History  Procedure Date  . Mastectomy   . Cholecystectomy   . Appendectomy   . Breast lumpectomy   . Abdominal hysterectomy     No family history on file.  History  Substance Use Topics  . Smoking status: Never Smoker   . Smokeless tobacco: Not on file  . Alcohol Use: No    OB History    Grav Para Term Preterm Abortions TAB SAB Ect Mult Living                  Review of Systems   Constitutional: Negative for fever and chills.  HENT: Negative for congestion.   Eyes: Negative for redness.  Respiratory: Negative for cough and shortness of breath.   Cardiovascular: Negative for chest pain.  Gastrointestinal: Positive for nausea, abdominal pain and abdominal distention. Negative for vomiting, diarrhea and constipation.  Genitourinary: Negative for dysuria.  Musculoskeletal: Positive for back pain.  Skin: Negative for rash.  Neurological: Negative for headaches.  Hematological: Does not bruise/bleed easily.  Psychiatric/Behavioral: Negative for confusion.  All other systems reviewed and are negative.    Allergies  Augmentin; Compazine; Diuretic; Iohexol; Ivp dye; Lamisil; Lisinopril; Phenergan; Reglan; Sulfa antibiotics; Tussin; and Vancomycin  Home Medications   Current Outpatient Rx  Name Route Sig Dispense Refill  . AMLODIPINE BESYLATE 5 MG PO TABS Oral Take 5 mg by mouth daily.      . B COMPLEX PO TABS Oral Take 1 tablet by mouth daily.      Marland Kitchen BENZONATATE 200 MG PO CAPS Oral Take 200 mg by mouth 3 (three) times daily as needed. cough     . BUDESONIDE-FORMOTEROL FUMARATE 160-4.5 MCG/ACT IN AERO Inhalation Inhale 2 puffs into the lungs 2 (two) times daily.      Marland Kitchen CALTRATE 600+D PO Oral Take 1 tablet by mouth 3 (three) times daily.      Marland Kitchen CLONAZEPAM 1 MG PO TABS Oral Take 1 mg by mouth  3 (three) times daily.      . CYCLOBENZAPRINE HCL 10 MG PO TABS Oral Take 5-10 mg by mouth 3 (three) times daily as needed. Muscle spasms     . ESOMEPRAZOLE MAGNESIUM 40 MG PO CPDR Oral Take 40 mg by mouth 2 (two) times daily.      . OMEGA-3 FATTY ACIDS 1000 MG PO CAPS Oral Take 1 g by mouth daily.      Lowanda Foster ECZEMA EX Apply externally Apply 1 application topically 2 (two) times daily as needed. For eczema     . HYDROXYZINE HCL 50 MG PO TABS Oral Take 50 mg by mouth 3 (three) times daily. For itching    . LEVALBUTEROL TARTRATE 45 MCG/ACT IN AERO Inhalation Inhale 2 puffs  into the lungs every 4 (four) hours as needed. Shortness of breath.     Marland Kitchen LEVOTHYROXINE SODIUM 75 MCG PO TABS Oral Take 75 mcg by mouth daily.      Marland Kitchen LOSARTAN POTASSIUM-HCTZ 100-12.5 MG PO TABS Oral Take 1 tablet by mouth daily.      . MORPHINE SULFATE 30 MG PO TABS Oral Take 60 mg by mouth every 4 (four) hours as needed. Pain.    Marland Kitchen ONDANSETRON HCL 4 MG PO TABS Oral Take 4 mg by mouth every 8 (eight) hours as needed. For nausea     . PREDNISONE 5 MG PO TABS Oral Take 5 mg by mouth daily.      Marland Kitchen VITAMIN B-2 100 MG PO TABS Oral Take 100 mg by mouth daily.      Marland Kitchen SPIRONOLACTONE 25 MG PO TABS Oral Take 25 mg by mouth 2 (two) times daily. For swelling     . VALACYCLOVIR HCL 1 G PO TABS Oral Take 1,000 mg by mouth 2 (two) times daily.        BP 137/82  Pulse 96  Temp(Src) 98.7 F (37.1 C) (Oral)  Resp 10  Wt 220 lb 10.9 oz (100.1 kg)  SpO2 94%  Physical Exam  Constitutional: She is oriented to person, place, and time. No distress.       Morbidly OBese  HENT:  Head: Normocephalic and atraumatic.  Eyes: Pupils are equal, round, and reactive to light.  Neck: Normal range of motion.  Cardiovascular: Normal rate, regular rhythm and normal heart sounds.   No murmur heard. Pulmonary/Chest: Effort normal and breath sounds normal. No respiratory distress. She has no wheezes. She has no rales.  Abdominal: Soft. She exhibits distension. She exhibits no mass. There is tenderness. There is no rebound and no guarding.       Tenderness in the epigastric and left upper quadrant area and left lower abd areas  Musculoskeletal: Normal range of motion. She exhibits no edema and no tenderness.  Neurological: She is alert and oriented to person, place, and time. No cranial nerve deficit.  Skin: Skin is warm and dry. No rash noted. No erythema.  Psychiatric: She has a normal mood and affect. Her behavior is normal.    ED Course  Procedures (including critical care time) 60 year old, female, with epigastric  and left upper quadrant pain for several days, associated with nausea.  She apparently has had history of increased amylase levels in the past.  She denies drinking alcohol and has had a cholecystectomy.  The suspicion is that she may have iatrogenic pancreatitis.  [facility] where her medications.  We will establish an IV give analgesics, and antiemetics, and also perform laboratory testing, and an x-ray of  her abdomen.   Labs Reviewed  URINALYSIS, ROUTINE W REFLEX MICROSCOPIC     3:58 PM Pain controlled but still nauseated and has left abd ttp.   Will get ct to check for diverticulitis.   MDM  Abdominal pain.        Nicholes Stairs, MD 11/22/11 [phone]

## 2011-11-21 NOTE — ED Provider Notes (Signed)
  Physical Exam  BP 155/75  Pulse 100  Temp(Src) 98.9 F (37.2 C) (Oral)  Resp 20  Wt 220 lb 10.9 oz (100.1 kg)  SpO2 98%  Physical Exam  ED Course  Procedures  MDM Received in signout from Dr. Weldon Inches. Sent in for abdominal pain and elevated lipase. Her lipase is not elevated here. CT was done which did not show to cause the pain. Patient feels better. She'll be discharged home.      Juliet Rude. Rubin Payor, MD 11/21/11 [phone]

## 2011-11-21 NOTE — ED Notes (Signed)
Pt trying to drink contrast at this time, had 1 episode of vomiting, administered zofran, comfort measures and have cool rag. Will continue to assess

## 2011-11-21 NOTE — ED Notes (Signed)
Pt states "it's been bothering me since the 15th, was seen on the 16th, MD called & said my amylase was up and wanted me to come to the ER"; pt c/o pain in upper abd that radiates into back

## 2011-11-27 ENCOUNTER — Telehealth: Payer: Self-pay

## 2011-11-27 ENCOUNTER — Ambulatory Visit: Payer: BC Managed Care – PPO | Admitting: Oncology

## 2011-11-27 ENCOUNTER — Telehealth: Payer: Self-pay | Admitting: *Deleted

## 2011-11-27 NOTE — Telephone Encounter (Signed)
Rec'd message from pt stating that she just got out of the hospital recently with multiple kidney stones on her L side and 1 large kidney stone on her R side that she can't pass, and she was told to stay on bedrest until she sees Dr. Isabel Caprice on Thursday.  Pt states she will get back with this office after she knows what will be done.  MD made aware.

## 2011-11-27 NOTE — Telephone Encounter (Signed)
patient called in to inform the doctor of the kidney stones patient will not be in on 11-27-2011 after she see's the grapey about passing the stone

## 2011-12-19 ENCOUNTER — Telehealth: Payer: Self-pay

## 2011-12-19 NOTE — Telephone Encounter (Signed)
[name] CALLED TO GIVE AN UP DATE ON HER CONDITION TO DR. Darrold [name]. SHE HAS DR. Isabel Caprice FOLLOWING HER FOR KIDNEY STONES. THE ONE ON THE RIGHT IS TO LARGE TO PASS SO HOPEFULLY IT WILL NOT CAUSE PROBLEMS. SHE HAS MAMMOGRAMS WITH DR.Yolanda Bonine  TODAY. IF DR. Darrold [name] DOES NOT FEEL THAT SHE NEEDS TO BE SEEN AFTER RECEIVING THE MAMMOGRAM REPORTS, SHE WOULD LIKE TO WAIT A YEAR TO SEE DR. Darrold [name] DUE TO FINANCES AND SEEING MULTIPLE PHYSICIANS AT PRESENT.

## 2011-12-29 ENCOUNTER — Encounter: Payer: Self-pay | Admitting: Oncology

## 2012-01-03 ENCOUNTER — Encounter (INDEPENDENT_AMBULATORY_CARE_PROVIDER_SITE_OTHER): Payer: Self-pay

## 2012-01-12 ENCOUNTER — Telehealth: Payer: Self-pay

## 2012-01-12 NOTE — Telephone Encounter (Signed)
TOLD [name] THAT DR. Darrold Span REVIEWED HER MAMMOGRAM REPORT FROM 12-19-11 FROM [facility] AND EVERYTHING  LOOKED GOOD.  SHE CAN WAIT A YEAR TO SEE DR. Darrold Span WITH OTHER HEALTH ISSUES SHE IS WORKING THROUGH AT THIS TIME.

## 2012-02-09 ENCOUNTER — Encounter: Payer: Self-pay | Admitting: Oncology

## 2012-02-09 ENCOUNTER — Other Ambulatory Visit: Payer: Self-pay | Admitting: Oncology

## 2012-02-09 ENCOUNTER — Telehealth: Payer: Self-pay | Admitting: Oncology

## 2012-02-09 DIAGNOSIS — D0510 Intraductal carcinoma in situ of unspecified breast: Secondary | ICD-10-CM

## 2012-02-09 NOTE — Telephone Encounter (Signed)
Gave pt appt in April 2013

## 2012-02-09 NOTE — Progress Notes (Signed)
Request from primary MD to our new patient scheduler that this established patient be seen back for anemia. Attached CBCs are from 10-2011 and prior. Note patient had cancelled last apt and preferred f/u in 1 year due to all of other MD apts. I am glad to see her sooner if she would like; message sent to schedulers.

## 2012-02-19 ENCOUNTER — Encounter (HOSPITAL_COMMUNITY): Payer: Self-pay | Admitting: General Practice

## 2012-02-19 ENCOUNTER — Inpatient Hospital Stay (HOSPITAL_COMMUNITY): Payer: BC Managed Care – PPO

## 2012-02-19 ENCOUNTER — Observation Stay (HOSPITAL_COMMUNITY)
Admission: AD | Admit: 2012-02-19 | Discharge: 2012-02-22 | Disposition: A | Discharge status: Home / Self Care | Payer: BC Managed Care – PPO | Source: Ambulatory Visit | Attending: Internal Medicine | Admitting: Internal Medicine

## 2012-02-19 DIAGNOSIS — E271 Primary adrenocortical insufficiency: Secondary | ICD-10-CM | POA: Diagnosis present

## 2012-02-19 DIAGNOSIS — G894 Chronic pain syndrome: Secondary | ICD-10-CM | POA: Insufficient documentation

## 2012-02-19 DIAGNOSIS — Z853 Personal history of malignant neoplasm of breast: Secondary | ICD-10-CM | POA: Insufficient documentation

## 2012-02-19 DIAGNOSIS — B029 Zoster without complications: Secondary | ICD-10-CM | POA: Insufficient documentation

## 2012-02-19 DIAGNOSIS — I10 Essential (primary) hypertension: Secondary | ICD-10-CM | POA: Insufficient documentation

## 2012-02-19 DIAGNOSIS — D509 Iron deficiency anemia, unspecified: Secondary | ICD-10-CM | POA: Insufficient documentation

## 2012-02-19 DIAGNOSIS — E079 Disorder of thyroid, unspecified: Secondary | ICD-10-CM

## 2012-02-19 DIAGNOSIS — E039 Hypothyroidism, unspecified: Secondary | ICD-10-CM | POA: Diagnosis present

## 2012-02-19 DIAGNOSIS — E669 Obesity, unspecified: Secondary | ICD-10-CM | POA: Insufficient documentation

## 2012-02-19 DIAGNOSIS — M549 Dorsalgia, unspecified: Secondary | ICD-10-CM | POA: Insufficient documentation

## 2012-02-19 DIAGNOSIS — E2749 Other adrenocortical insufficiency: Secondary | ICD-10-CM | POA: Insufficient documentation

## 2012-02-19 DIAGNOSIS — G2581 Restless legs syndrome: Secondary | ICD-10-CM

## 2012-02-19 DIAGNOSIS — K5900 Constipation, unspecified: Secondary | ICD-10-CM | POA: Insufficient documentation

## 2012-02-19 DIAGNOSIS — IMO0002 Reserved for concepts with insufficient information to code with codable children: Secondary | ICD-10-CM | POA: Insufficient documentation

## 2012-02-19 DIAGNOSIS — R109 Unspecified abdominal pain: Principal | ICD-10-CM | POA: Insufficient documentation

## 2012-02-19 DIAGNOSIS — E119 Type 2 diabetes mellitus without complications: Secondary | ICD-10-CM | POA: Insufficient documentation

## 2012-02-19 HISTORY — DX: Anemia, unspecified: D64.9

## 2012-02-19 HISTORY — DX: Restless legs syndrome: G25.81

## 2012-02-19 HISTORY — DX: Malignant neoplasm of unspecified site of unspecified female breast: C50.919

## 2012-02-19 HISTORY — DX: Nonspecific reaction to tuberculin skin test without active tuberculosis: R76.11

## 2012-02-19 HISTORY — DX: Migraine, unspecified, not intractable, without status migrainosus: G43.909

## 2012-02-19 HISTORY — DX: Headache: R51

## 2012-02-19 HISTORY — DX: Radiation sickness, unspecified, initial encounter: T66.XXXA

## 2012-02-19 HISTORY — DX: Pneumonia, unspecified organism: J18.9

## 2012-02-19 HISTORY — DX: Hypothyroidism, unspecified: E03.9

## 2012-02-19 HISTORY — DX: Fibromyalgia: M79.7

## 2012-02-19 HISTORY — DX: Gastrointestinal hemorrhage, unspecified: K92.2

## 2012-02-19 HISTORY — DX: Chronic obstructive pulmonary disease, unspecified: J44.9

## 2012-02-19 HISTORY — DX: Personal history of other diseases of the digestive system: Z87.19

## 2012-02-19 HISTORY — DX: Unspecified viral hepatitis B without hepatic coma: B19.10

## 2012-02-19 HISTORY — DX: Unspecified adrenocortical insufficiency: E27.40

## 2012-02-19 HISTORY — DX: Multiple sclerosis: G35

## 2012-02-19 HISTORY — DX: Gastro-esophageal reflux disease without esophagitis: K21.9

## 2012-02-19 HISTORY — DX: Sleep apnea, unspecified: G47.30

## 2012-02-19 HISTORY — DX: Unspecified osteoarthritis, unspecified site: M19.90

## 2012-02-19 LAB — LIPASE, BLOOD: Lipase: 8 U/L — ABNORMAL LOW (ref 11–59)

## 2012-02-19 LAB — CBC
HCT: 32.4 % — ABNORMAL LOW (ref 36.0–46.0)
Hemoglobin: 10 g/dL — ABNORMAL LOW (ref 12.0–15.0)
MCH: 23.6 pg — ABNORMAL LOW (ref 26.0–34.0)
RBC: 4.24 MIL/uL (ref 3.87–5.11)

## 2012-02-19 LAB — COMPREHENSIVE METABOLIC PANEL
BUN: 19 mg/dL (ref 6–23)
CO2: 27 mEq/L (ref 19–32)
Calcium: 9.3 mg/dL (ref 8.4–10.5)
Creatinine, Ser: 0.78 mg/dL (ref 0.50–1.10)
GFR calc Af Amer: >90 mL/min (ref >90)
GFR calc non Af Amer: 90 mL/min — ABNORMAL LOW (ref >90)
Glucose, Bld: 94 mg/dL (ref 70–99)

## 2012-02-19 LAB — RETICULOCYTES
Retic Count, Absolute: 80.6 10*3/uL (ref 19.0–186.0)
Retic Ct Pct: 1.9 % (ref 0.4–3.1)

## 2012-02-19 LAB — PROTIME-INR
INR: 1 (ref 0.00–1.49)
Prothrombin Time: 13.4 seconds (ref 11.6–15.2)

## 2012-02-19 MED ORDER — SENNA 8.6 MG PO TABS
1.0000 | ORAL_TABLET | Freq: Two times a day (BID) | ORAL | Status: DC
  Administered 2012-02-19 – 2012-02-22 (×6): 8.6 mg via ORAL
  Filled 2012-02-19 (×7): qty 1

## 2012-02-19 MED ORDER — CLONAZEPAM 1 MG PO TABS
1.0000 mg | ORAL_TABLET | Freq: Three times a day (TID) | ORAL | Status: DC
  Administered 2012-02-19 – 2012-02-22 (×8): 1 mg via ORAL
  Filled 2012-02-19 (×8): qty 1

## 2012-02-19 MED ORDER — LOSARTAN POTASSIUM-HCTZ 100-12.5 MG PO TABS: 1.0000 | ORAL_TABLET | Freq: Every day | ORAL | Status: DC

## 2012-02-19 MED ORDER — VALACYCLOVIR HCL 500 MG PO TABS
1000.0000 mg | ORAL_TABLET | Freq: Two times a day (BID) | ORAL | Status: DC
  Administered 2012-02-19 – 2012-02-22 (×6): 1000 mg via ORAL
  Filled 2012-02-19 (×7): qty 2

## 2012-02-19 MED ORDER — LEVOTHYROXINE SODIUM 75 MCG PO TABS
75.0000 ug | ORAL_TABLET | Freq: Every day | ORAL | Status: DC
  Administered 2012-02-19 – 2012-02-21 (×3): 75 ug via ORAL
  Filled 2012-02-19 (×6): qty 1

## 2012-02-19 MED ORDER — LEVALBUTEROL TARTRATE 45 MCG/ACT IN AERO
2.0000 | INHALATION_SPRAY | RESPIRATORY_TRACT | Status: DC | PRN
  Administered 2012-02-20 – 2012-02-21 (×2): 2 via RESPIRATORY_TRACT
  Filled 2012-02-19: qty 15

## 2012-02-19 MED ORDER — VITAMIN D3 25 MCG (1000 UNIT) PO TABS
2000.0000 [IU] | ORAL_TABLET | Freq: Every day | ORAL | Status: DC
  Administered 2012-02-19 – 2012-02-21 (×3): 2000 [IU] via ORAL
  Filled 2012-02-19 (×4): qty 2

## 2012-02-19 MED ORDER — LOSARTAN POTASSIUM 50 MG PO TABS
100.0000 mg | ORAL_TABLET | Freq: Every day | ORAL | Status: DC
  Administered 2012-02-19 – 2012-02-22 (×4): 100 mg via ORAL
  Filled 2012-02-19 (×4): qty 2

## 2012-02-19 MED ORDER — FLEET ENEMA 7-19 GM/118ML RE ENEM
1.0000 | ENEMA | Freq: Once | RECTAL | Status: AC | PRN
  Filled 2012-02-19: qty 1

## 2012-02-19 MED ORDER — AMLODIPINE BESYLATE 5 MG PO TABS
5.0000 mg | ORAL_TABLET | Freq: Every day | ORAL | Status: DC
  Administered 2012-02-19 – 2012-02-22 (×4): 5 mg via ORAL
  Filled 2012-02-19 (×4): qty 1

## 2012-02-19 MED ORDER — VITAMIN B-2 100 MG PO TABS
100.0000 mg | ORAL_TABLET | Freq: Every day | ORAL | Status: DC
Start: 2012-02-19 — End: 2012-02-19

## 2012-02-19 MED ORDER — ENOXAPARIN SODIUM 40 MG/0.4ML ~~LOC~~ SOLN
40.0000 mg | SUBCUTANEOUS | Status: DC
  Administered 2012-02-19 – 2012-02-21 (×3): 40 mg via SUBCUTANEOUS
  Filled 2012-02-19 (×4): qty 0.4

## 2012-02-19 MED ORDER — ACETAMINOPHEN 650 MG RE SUPP: 650.0000 mg | Freq: Four times a day (QID) | RECTAL | Status: DC | PRN

## 2012-02-19 MED ORDER — ONDANSETRON HCL 4 MG PO TABS
4.0000 mg | ORAL_TABLET | Freq: Four times a day (QID) | ORAL | Status: DC | PRN
  Administered 2012-02-21 – 2012-02-22 (×3): 4 mg via ORAL
  Filled 2012-02-19 (×3): qty 1

## 2012-02-19 MED ORDER — CYCLOBENZAPRINE HCL 5 MG PO TABS
5.0000 mg | ORAL_TABLET | Freq: Three times a day (TID) | ORAL | Status: DC
  Administered 2012-02-19 – 2012-02-20 (×4): 5 mg via ORAL
  Administered 2012-02-21 (×2): 10 mg via ORAL
  Administered 2012-02-21: 5 mg via ORAL
  Administered 2012-02-22: 10 mg via ORAL
  Filled 2012-02-19 (×10): qty 2

## 2012-02-19 MED ORDER — ONDANSETRON HCL 4 MG/2ML IJ SOLN: 4.0000 mg | Freq: Four times a day (QID) | INTRAMUSCULAR | Status: DC | PRN

## 2012-02-19 MED ORDER — OMEGA-3-ACID ETHYL ESTERS 1 G PO CAPS
1.0000 g | ORAL_CAPSULE | Freq: Every day | ORAL | Status: DC
  Administered 2012-02-19 – 2012-02-22 (×4): 1 g via ORAL
  Filled 2012-02-19 (×4): qty 1

## 2012-02-19 MED ORDER — HYDROCORTISONE 1 % EX LOTN
TOPICAL_LOTION | Freq: Two times a day (BID) | CUTANEOUS | Status: DC | PRN
  Filled 2012-02-19: qty 118

## 2012-02-19 MED ORDER — SODIUM CHLORIDE 0.9 % IV SOLN
INTRAVENOUS | Status: DC
  Administered 2012-02-19 – 2012-02-20 (×2): via INTRAVENOUS

## 2012-02-19 MED ORDER — MORPHINE SULFATE 15 MG PO TABS
60.0000 mg | ORAL_TABLET | ORAL | Status: DC | PRN
  Administered 2012-02-19 – 2012-02-22 (×11): 60 mg via ORAL
  Filled 2012-02-19 (×11): qty 4

## 2012-02-19 MED ORDER — ALBUTEROL SULFATE (5 MG/ML) 0.5% IN NEBU: 2.5000 mg | INHALATION_SOLUTION | RESPIRATORY_TRACT | Status: DC | PRN

## 2012-02-19 MED ORDER — VALACYCLOVIR HCL 500 MG PO TABS: 1000.0000 mg | ORAL_TABLET | Freq: Two times a day (BID) | ORAL | Status: DC

## 2012-02-19 MED ORDER — ACETAMINOPHEN 325 MG PO TABS
650.0000 mg | ORAL_TABLET | Freq: Four times a day (QID) | ORAL | Status: DC | PRN
  Filled 2012-02-19 (×2): qty 2

## 2012-02-19 MED ORDER — MORPHINE SULFATE 2 MG/ML IJ SOLN: 2.0000 mg | INTRAMUSCULAR | Status: DC | PRN

## 2012-02-19 MED ORDER — VITAMIN B-1 50 MG PO TABS
250.0000 mg | ORAL_TABLET | Freq: Every day | ORAL | Status: DC
  Administered 2012-02-21 – 2012-02-22 (×2): 250 mg via ORAL
  Filled 2012-02-19 (×5): qty 1

## 2012-02-19 MED ORDER — PANTOPRAZOLE SODIUM 40 MG PO TBEC
40.0000 mg | DELAYED_RELEASE_TABLET | Freq: Every day | ORAL | Status: DC
  Administered 2012-02-20: 40 mg via ORAL
  Filled 2012-02-19: qty 1

## 2012-02-19 MED ORDER — HYDROXYZINE HCL 50 MG PO TABS
50.0000 mg | ORAL_TABLET | Freq: Three times a day (TID) | ORAL | Status: DC
  Administered 2012-02-19 – 2012-02-22 (×8): 50 mg via ORAL
  Filled 2012-02-19 (×10): qty 1

## 2012-02-19 MED ORDER — PREDNISONE 5 MG PO TABS
5.0000 mg | ORAL_TABLET | Freq: Every day | ORAL | Status: DC
  Administered 2012-02-20 – 2012-02-22 (×3): 5 mg via ORAL
  Filled 2012-02-19 (×4): qty 1

## 2012-02-19 MED ORDER — BENZONATATE 100 MG PO CAPS
200.0000 mg | ORAL_CAPSULE | Freq: Three times a day (TID) | ORAL | Status: DC | PRN
  Filled 2012-02-19: qty 2

## 2012-02-19 MED ORDER — HYDROCHLOROTHIAZIDE 12.5 MG PO CAPS
12.5000 mg | ORAL_CAPSULE | Freq: Every day | ORAL | Status: DC
  Administered 2012-02-19 – 2012-02-22 (×4): 12.5 mg via ORAL
  Filled 2012-02-19 (×4): qty 1

## 2012-02-19 MED ORDER — OMEGA-3 FATTY ACIDS 1000 MG PO CAPS: 1.0000 g | ORAL_CAPSULE | Freq: Every day | ORAL | Status: DC

## 2012-02-19 MED ORDER — VITAMIN D3 50 MCG (2000 UT) PO TABS: 1.0000 | ORAL_TABLET | Freq: Every day | ORAL | Status: DC

## 2012-02-19 MED ORDER — POLYETHYLENE GLYCOL 3350 17 G PO PACK
17.0000 g | PACK | Freq: Every day | ORAL | Status: DC
  Administered 2012-02-19: 17 g via ORAL
  Filled 2012-02-19 (×3): qty 1

## 2012-02-19 MED ORDER — BUDESONIDE-FORMOTEROL FUMARATE 160-4.5 MCG/ACT IN AERO
2.0000 | INHALATION_SPRAY | Freq: Two times a day (BID) | RESPIRATORY_TRACT | Status: DC
  Administered 2012-02-19 – 2012-02-22 (×6): 2 via RESPIRATORY_TRACT
  Filled 2012-02-19: qty 6

## 2012-02-19 NOTE — H&P (Signed)
PATIENT DETAILS Name: Daisy [name] Age: 61 y.o. Sex: female Date of Birth: 11-17-1951 Admit Date: 02/19/2012 [id number] ALAN, MD, MD   CHIEF COMPLAINT:  Abdominal Pain  HPI: Patient is a 61 yo female with a Past Medical history of Addison's disease-on chronic prednisone, HTN, Diet controlled DM, Hypothyroidism,Recurrent Shingles, Chronic Pain Syndrome, ?prior h/o chronic pancreatitis, Fe def anemia, h/o breast cancer-s/p left mastectomy comes in with the above noted complaints. Per patient she has been having much more than usual abdominal pain for almost a month now, however this has now worsened over the past few days. She claims that she feels constipated. No diarrhea. No vomiting but feels nauseous. She has been only drinking liquids for the past few days. She went to her PCP today who referred the patient to the hospital for further evaluation and treatment.Per patient pain is around 8-9/10 at its worst, no radiation. No particular aggravating or relieving factors. She denies any fever. Denies any cough or shortness of breath.She has intermittent chronic headaches. She has chronic back pain and has a spinal cord stimulator in situ.  ALLERGIES:   Allergies  Allergen Reactions  . Augmentin Other (See Comments)    Bad headaches  . Compazine Other (See Comments)    Hyper and shaky  . Diuretic (Buchu-[place]-Ch Grass-Hydran) Other (See Comments)    (lasix gave leg cramps)   . Iohexol      Desc: ANAPHYLACTIC REACTION!-kmr-10/16/05   . Ivp Dye (Iodinated Diagnostic Agents) Other (See Comments)    Fever and blood clot  . Lamisil Other (See Comments)    Throat closed up.  . Lisinopril Cough  . Metformin And Related     Migraine   . Phenergan Other (See Comments)    Hyper,,,,shaky  . Reglan Other (See Comments)    Hyper, shaky  . Sulfa Antibiotics Other (See Comments)    unknown  . Tussin Other (See Comments)    hyper  . Vancomycin Swelling    PAST MEDICAL  HISTORY: Past Medical History  Diagnosis Date  . Asthma   . Cancer   . Addison's disease   . Diabetes mellitus   . Hypertension   . Thyroid disease   . Shingles     PAST SURGICAL HISTORY: Past Surgical History  Procedure Date  . Mastectomy   . Cholecystectomy   . Appendectomy   . Breast lumpectomy   . Abdominal hysterectomy     MEDICATIONS AT HOME: Prior to Admission medications   Medication Sig Start Date End Date Taking? Authorizing Provider  benzonatate (TESSALON) 200 MG capsule Take 200 mg by mouth 3 (three) times daily as needed. cough    Yes Historical Provider, MD  budesonide-formoterol (SYMBICORT) 160-4.5 MCG/ACT inhaler Inhale 2 puffs into the lungs 2 (two) times daily.     Yes Historical Provider, MD  Calcium Carbonate-Vitamin D (CALTRATE 600+D PO) Take 1 tablet by mouth daily.    Yes Historical Provider, MD  Cholecalciferol (VITAMIN D3) 2000 UNITS TABS Take 1 tablet by mouth daily.   Yes Historical Provider, MD  clonazePAM (KLONOPIN) 1 MG tablet Take 1 mg by mouth 3 (three) times daily.     Yes Historical Provider, MD  cyclobenzaprine (FLEXERIL) 10 MG tablet Take 5-10 mg by mouth 3 (three) times daily. Muscle spasms   Yes Historical Provider, MD  fish oil-omega-3 fatty acids 1000 MG capsule Take 1 g by mouth daily.     Yes Historical Provider, MD  Hydrocortisone (CORTIZONE-10 ECZEMA EX) Apply 1 application  topically 2 (two) times daily as needed. For eczema    Yes Historical Provider, MD  hydrOXYzine (ATARAX/VISTARIL) 50 MG tablet Take 50 mg by mouth 3 (three) times daily. For itching   Yes Historical Provider, MD  levalbuterol (XOPENEX HFA) 45 MCG/ACT inhaler Inhale 2 puffs into the lungs every 4 (four) hours as needed. Shortness of breath.    Yes Historical Provider, MD  levothyroxine (SYNTHROID, LEVOTHROID) 75 MCG tablet Take 75 mcg by mouth at bedtime.    Yes Historical Provider, MD  losartan-hydrochlorothiazide (HYZAAR) 100-12.5 MG per tablet Take 1 tablet by mouth  daily.     Yes Historical Provider, MD  morphine (MSIR) 30 MG tablet Take 60 mg by mouth every 4 (four) hours as needed. Pain.   Yes Historical Provider, MD  omeprazole (PRILOSEC) 40 MG capsule Take 40 mg by mouth at bedtime.   Yes Historical Provider, MD  ondansetron (ZOFRAN) 4 MG tablet Take 8 mg by mouth every 8 (eight) hours as needed. For nausea   Yes Historical Provider, MD  predniSONE (DELTASONE) 5 MG tablet Take 5 mg by mouth daily.     Yes Historical Provider, MD  riboflavin (VITAMIN B-2) 100 MG TABS Take 100 mg by mouth daily.     Yes Historical Provider, MD  Thiamine HCl (VITAMIN B-1) 250 MG tablet Take 250 mg by mouth daily.   Yes Historical Provider, MD  valACYclovir (VALTREX) 1000 MG tablet Take 1,000 mg by mouth 2 (two) times daily.     Yes Historical Provider, MD  amLODipine (NORVASC) 5 MG tablet Take 5 mg by mouth daily.      Historical Provider, MD    FAMILY HISTORY: No family history on file.  SOCIAL HISTORY:  reports that she has never smoked. She does not have any smokeless tobacco history on file. She reports that she does not drink alcohol or use illicit drugs.  REVIEW OF SYSTEMS:  Constitutional:   No  weight loss, night sweats,  Fevers, chills, fatigue.  HEENT:    No headaches, Difficulty swallowing,Tooth/dental problems,Sore throat,  No sneezing, itching, ear ache, nasal congestion, post nasal drip,   Cardio-vascular: No chest pain,  Orthopnea, PND, swelling in lower extremities, anasarca, dizziness, palpitations  GI:  No  vomiting, diarrhea, change in bowel habits, loss of appetite  Resp: No shortness of breath with exertion or at rest.  No excess mucus, no productive cough, No non-productive cough,  No coughing up of blood.No change in color of mucus.No wheezing.No chest wall deformity  Skin:  no rash or lesions.  GU:  no dysuria, change in color of urine, no urgency or frequency.  No flank pain.  Musculoskeletal: No joint pain or swelling.  No  decreased range of motion.  No back pain.  Psych: No change in mood or affect. No depression or anxiety.  No memory loss.   PHYSICAL EXAM: Blood pressure 172/82, pulse 93, temperature 98.2 F (36.8 C), resp. rate 19, height 5\' 3"  (1.6 m), weight 91.2 kg (201 lb 1 oz), SpO2 97.00%.  General appearance :Awake, alert, not in any distress. Speech Clear. Not toxic Looking HEENT: Atraumatic and Normocephalic, pupils equally reactive to light and accomodation Neck: supple, no JVD. No cervical lymphadenopathy.  Chest:Good air entry bilaterally, no added sounds  CVS: S1 S2 regular, no murmurs.  Abdomen: Bowel sounds present,Mildly tender in the upper abdomen with no gaurding, rigidity or rebound. Extremities: B/L Lower Ext shows no edema, both legs are warm to touch, with  dorsalis  pedis pulses palpable. Neurology: Awake alert, and oriented X 3, CN II-XII intact, Non focal Skin:No Rash Wounds:N/A  LABS ON ADMISSION:  No results found for this basename: NA:2,K:2,CL:2,CO2:2,GLUCOSE:2,BUN:2,CREATININE:2,CALCIUM:2,MG:2,PHOS:2 in the last 72 hours No results found for this basename: AST:2,ALT:2,ALKPHOS:2,BILITOT:2,PROT:2,ALBUMIN:2 in the last 72 hours No results found for this basename: LIPASE:2,AMYLASE:2 in the last 72 hours No results found for this basename: WBC:2,NEUTROABS:2,HGB:2,HCT:2,MCV:2,PLT:2 in the last 72 hours No results found for this basename: CKTOTAL:3,CKMB:3,CKMBINDEX:3,TROPONINI:3 in the last 72 hours No results found for this basename: DDIMER:2 in the last 72 hours No components found with this basename: POCBNP:3   RADIOLOGIC STUDIES ON ADMISSION: No results found.  ASSESSMENT AND PLAN: Present on Admission:  .Abdominal pain-worsening -worsening abd pain-claims to have a prior history of numerous episode of pancreatitis -for now will presume this to be a possible flare of possible chronic pancreatitis -keep NPO, get labs, and a abdominal xray -will check above results and  make further recommendations -continue with PPI and her usual home dosing of morphine  .Thyroid disease -continue with synthroid  .Shingles-history of recurrent shingles -on chronic Valtrex therapy  .History of Fe Deficiency anemia -will check a anemia panel -has follow up with Dr Darrold Span as outpatient next month  .Hypertension -continue with Hyzaar and amlodipine  .Addison's disease -continue with prednisone  Further plan will depend as patient's clinical course evolves and further radiologic and laboratory data become available. Patient will be monitored closely.  DVT Prophylaxis: Lovenox at prophylactic dosing  Code Status: Full Code  Total time spent for admission equals 45 minutes.  Jeoffrey Massed 02/19/2012, 5:45 PM

## 2012-02-20 LAB — MRSA PCR SCREENING: MRSA by PCR: NEGATIVE

## 2012-02-20 LAB — CBC
HCT: 33.7 % — ABNORMAL LOW (ref 36.0–46.0)
MCV: 76.9 fL — ABNORMAL LOW (ref 78.0–100.0)
RBC: 4.38 MIL/uL (ref 3.87–5.11)
RDW: 17.9 % — ABNORMAL HIGH (ref 11.5–15.5)
WBC: 8.2 10*3/uL (ref 4.0–10.5)

## 2012-02-20 LAB — FERRITIN: Ferritin: 7 ng/mL — ABNORMAL LOW (ref 10–291)

## 2012-02-20 LAB — BASIC METABOLIC PANEL
BUN: 14 mg/dL (ref 6–23)
CO2: 27 mEq/L (ref 19–32)
Chloride: 101 mEq/L (ref 96–112)
Creatinine, Ser: 0.78 mg/dL (ref 0.50–1.10)

## 2012-02-20 LAB — IRON AND TIBC: TIBC: 383 ug/dL (ref 250–470)

## 2012-02-20 MED ORDER — NITROGLYCERIN 0.4 MG SL SUBL: 0.4000 mg | SUBLINGUAL_TABLET | SUBLINGUAL | Status: DC | PRN

## 2012-02-20 MED ORDER — CALCIUM CARBONATE ANTACID 500 MG PO CHEW
3.0000 | CHEWABLE_TABLET | ORAL | Status: AC
  Administered 2012-02-20: 600 mg via ORAL
  Filled 2012-02-20: qty 3

## 2012-02-20 MED ORDER — POLYETHYLENE GLYCOL 3350 17 G PO PACK
17.0000 g | PACK | ORAL | Status: AC
  Administered 2012-02-20 (×3): 17 g via ORAL
  Filled 2012-02-20 (×5): qty 1

## 2012-02-20 MED ORDER — POLYETHYLENE GLYCOL 3350 17 G PO PACK
17.0000 g | PACK | ORAL | Status: AC
  Administered 2012-02-20 (×2): 17 g via ORAL
  Filled 2012-02-20 (×3): qty 1

## 2012-02-20 MED ORDER — CYANOCOBALAMIN 1000 MCG/ML IJ SOLN
1000.0000 ug | Freq: Once | INTRAMUSCULAR | Status: AC
  Administered 2012-02-20: 1000 ug via INTRAMUSCULAR
  Filled 2012-02-20: qty 1

## 2012-02-20 MED ORDER — FLEET ENEMA 7-19 GM/118ML RE ENEM
1.0000 | ENEMA | Freq: Once | RECTAL | Status: DC
  Filled 2012-02-20: qty 1

## 2012-02-20 MED ORDER — POTASSIUM CHLORIDE CRYS ER 20 MEQ PO TBCR
20.0000 meq | EXTENDED_RELEASE_TABLET | Freq: Two times a day (BID) | ORAL | Status: DC
  Administered 2012-02-20 – 2012-02-22 (×5): 20 meq via ORAL
  Filled 2012-02-20 (×8): qty 1

## 2012-02-20 NOTE — Progress Notes (Signed)
   CARE MANAGEMENT NOTE 02/20/2012  Patient:  Daisy [name], Daisy [name]   Account Number:  [id number]  Date Initiated:  02/20/2012  Documentation initiated by:  Letha Cape  Subjective/Objective Assessment:   dx abd pain  admit- lives with spouse.  pta independent.     Action/Plan:   Anticipated DC Date:  02/21/2012   Anticipated DC Plan:  HOME/SELF CARE      DC Planning Services  CM consult      Choice offered to / List presented to:             Status of service:  In process, will continue to follow Medicare Important Message given?   (If response is "NO", the following Medicare IM given date fields will be blank) Date Medicare IM given:   Date Additional Medicare IM given:    Discharge Disposition:    Per UR Regulation:    If discussed at Long Length of Stay Meetings, dates discussed:    Comments:  02/20/12 11:25 Letha Cape RN, BSN [phone] patient lives with spouse, pta independent, patient has a cpap at home.  Patient for possible dc tomorrow.  Patient states she had medication coverage and transportation.

## 2012-02-20 NOTE — Progress Notes (Signed)
Patient ID: Daisy [name], female   DOB: 05/21/1951, 61 y.o.   MRN: [id number] Subjective: I'm so bloated.  Still has abdominal pain.  Reports she has small hard stool balls daily.  Objective: Weight change:  No intake or output data in the 24 hours ending 02/20/12 0808 Blood pressure 100/62, pulse 75, temperature 97.7 F (36.5 C), temperature source Oral, resp. rate 17, height 5\' 3"  (1.6 m), weight 91.2 kg (201 lb 1 oz), SpO2 91.00%. Filed Vitals:   02/19/12 2229 02/19/12 2231 02/19/12 2355 02/20/12 0615  BP: 119/72   100/62  Pulse: 79  85 75  Temp: 98.1 F (36.7 C)   97.7 F (36.5 C)  TempSrc: Oral   Oral  Resp: 20  18 17   Height:      Weight:      SpO2: 94% 94% 93% 91%    Physical Exam: General: No acute distress Lungs: Clear to auscultation bilaterally without wheezes or crackles Cardiovascular: Regular rate and rhythm without murmur gallop or rub normal S1 and S2 Abdomen: Nontender,distended not tight, soft, bowel sounds positive Extremities: No significant cyanosis, clubbing.  1+ edema bilaterally in  lower extremities  Basic Metabolic Panel:  Lab 02/19/12 4098  NA 137  K 3.9  CL 98  CO2 27  GLUCOSE 94  BUN 19  CREATININE 0.78  CALCIUM 9.3  MG --  PHOS --   Liver Function Tests:  Lab 02/19/12 1826  AST 28  ALT 22  ALKPHOS 84  BILITOT 0.3  PROT 7.0  ALBUMIN 3.5    Lab 02/19/12 1826  LIPASE 8*  AMYLASE --   CBC:  Lab 02/19/12 1826  WBC 10.7*  NEUTROABS --  HGB 10.0*  HCT 32.4*  MCV 76.4*  PLT 269   CBG:  Lab 02/20/12 0746  GLUCAP 91   Coagulation:  Lab 02/19/12 1826  LABPROT 13.4  INR 1.00   Anemia Panel:  Lab 02/19/12 1826  VITAMINB12 341  FOLATE 14.5  FERRITIN 7*  TIBC 383  IRON 34*  RETICCTPCT 1.9    Micro Results: Recent Results (from the past 240 hour(s))  MRSA PCR SCREENING     Status: Normal   Collection Time   02/20/12 12:15 AM      Component Value Range Status Comment   MRSA by PCR NEGATIVE  NEGATIVE  Final       Studies/Results: Scheduled Meds:   . amLODipine  5 mg Oral Daily  . budesonide-formoterol  2 puff Inhalation BID  . cholecalciferol  2,000 Units Oral Daily  . clonazePAM  1 mg Oral TID  . cyclobenzaprine  5-10 mg Oral TID  . enoxaparin  40 mg Subcutaneous Q24H  . hydrochlorothiazide  12.5 mg Oral Daily  . hydrOXYzine  50 mg Oral TID  . levothyroxine  75 mcg Oral QHS  . losartan  100 mg Oral Daily  . omega-3 acid ethyl esters  1 g Oral Daily  . pantoprazole  40 mg Oral Q1200  . polyethylene glycol  17 g Oral Daily  . predniSONE  5 mg Oral Daily  . senna  1 tablet Oral BID  . sodium phosphate  1 enema Rectal Once  . vitamin B-1  250 mg Oral Daily  . valACYclovir  1,000 mg Oral BID  . DISCONTD: fish oil-omega-3 fatty acids  1 g Oral Daily  . DISCONTD: losartan-hydrochlorothiazide  1 tablet Oral Daily  . DISCONTD: riboflavin  100 mg Oral Daily  . DISCONTD: valACYclovir  1,000 mg Oral  BID  . DISCONTD: Vitamin D3  1 tablet Oral Daily   Continuous Infusions:   . sodium chloride 100 mL/hr at 02/20/12 0532   PRN Meds:.acetaminophen, acetaminophen, albuterol, benzonatate, hydrocortisone, levalbuterol, morphine, morphine, ondansetron (ZOFRAN) IV, ondansetron, sodium phosphate  Anti-infectives:  Anti-infectives     Start     Dose/Rate Route Frequency Ordered Stop   02/19/12 2200   valACYclovir (VALTREX) tablet 1,000 mg  Status:  Discontinued        1,000 mg Oral 2 times daily 02/19/12 1719 02/19/12 1753   02/19/12 2200   valACYclovir (VALTREX) tablet 1,000 mg        1,000 mg Oral 2 times daily 02/19/12 1754            Assessment/Plan: Principal Problem:  *Abdominal pain Active Problems:  OBESITY  Addison's disease  Hypertension  Thyroid disease  Shingles    LOS: 1 day   Abdominal pain - epigastric, of uncertain etiology. On omeprazole.  - Reports last EGD with Dr. Madilyn Fireman was negative for source of pain.  - Xray shows constipation - will give stool softeners and  enema today. - On chronic narcotics - will give SL Nitro prn esophageal spasm.  (allergies to gi cocktail)  Microcytic Anemia - scheduled for out patient work up with Dr. Darrold Span.  Will guiac stool.  Hypertension - Amlodipine and HCTZ  Gastroesophageal reflux disease. - Omeprazole.  Hx of Hypothyroidism.   Obesity  Adrenal insufficiency on chronic prednisone therapy as directed by endocrinologist at [facility].   Chronic back pain, followed in the Pain Clinic.  DVT Prophylaxis: lovenox   Stephani Police 02/20/2012, 8:08 AM [phone]  Attending  I've seen and examined Miss Oviatt, she claims that abdominal pain is significantly better today and is very anxious to eat something. She has not had a bowel movement yet today her x-ray shows a large stool burden. It is at this time presumed that abdominal pain is coming from constipation. Although there is a questionable history of chronic pancreatitis her lipase levels are normal. We will try her on miralax, and may need enemas at some point. I have reviewed the above and I agree with the assessment and plan as outlined by Ms [place].  Dr Windell Norfolk

## 2012-02-20 NOTE — Progress Notes (Signed)
Utilization review completed.  

## 2012-02-21 MED ORDER — PANTOPRAZOLE SODIUM 40 MG PO TBEC
40.0000 mg | DELAYED_RELEASE_TABLET | Freq: Two times a day (BID) | ORAL | Status: DC
  Administered 2012-02-21 – 2012-02-22 (×2): 40 mg via ORAL
  Filled 2012-02-21 (×2): qty 1

## 2012-02-21 MED ORDER — POLYETHYLENE GLYCOL 3350 17 G PO PACK
17.0000 g | PACK | ORAL | Status: AC
  Administered 2012-02-21: 17 g via ORAL
  Filled 2012-02-21 (×6): qty 1

## 2012-02-21 MED ORDER — DOCUSATE SODIUM 100 MG PO CAPS
200.0000 mg | ORAL_CAPSULE | Freq: Every day | ORAL | Status: DC
  Administered 2012-02-21 – 2012-02-22 (×2): 200 mg via ORAL
  Filled 2012-02-21 (×2): qty 2

## 2012-02-21 MED ORDER — LEVALBUTEROL TARTRATE 45 MCG/ACT IN AERO
2.0000 | INHALATION_SPRAY | Freq: Two times a day (BID) | RESPIRATORY_TRACT | Status: DC
  Administered 2012-02-21 – 2012-02-22 (×2): 2 via RESPIRATORY_TRACT

## 2012-02-21 MED ORDER — PEG 3350-KCL-NA BICARB-NACL 420 G PO SOLR
2000.0000 mL | Freq: Once | ORAL | Status: AC
  Administered 2012-02-21: 2000 mL via ORAL
  Filled 2012-02-21: qty 4000

## 2012-02-21 MED ORDER — DICYCLOMINE HCL 20 MG PO TABS
20.0000 mg | ORAL_TABLET | Freq: Three times a day (TID) | ORAL | Status: DC
  Administered 2012-02-21 – 2012-02-22 (×4): 20 mg via ORAL
  Filled 2012-02-21 (×9): qty 1

## 2012-02-21 NOTE — Progress Notes (Signed)
Patient ID: Daisy [name], female   DOB: January 01, 1951, 61 y.o.   MRN: [id number]   Subjective:  Complains of severe esophageal spasm pain last night.  Refused SL nitro due to propensity for migraines.  Had one bm yesterday.  Would like to have one more today before d/c.  Objective: Weight change:   Intake/Output Summary (Last 24 hours) at 02/21/12 0746 Last data filed at 02/20/12 1400  Gross per 24 hour  Intake    360 ml  Output      0 ml  Net    360 ml   Blood pressure 123/75, pulse 84, temperature 97.8 F (36.6 C), temperature source Oral, resp. rate 18, height 5\' 3"  (1.6 m), weight 91.2 kg (201 lb 1 oz), SpO2 94.00%. Filed Vitals:   02/20/12 1900 02/20/12 2230 02/20/12 2246 02/21/12 0520  BP:  94/49 105/66 123/75  Pulse: 88 82  84  Temp:  97.3 F (36.3 C)  97.8 F (36.6 C)  TempSrc:  Oral  Oral  Resp: 16 18  18   Height:      Weight:      SpO2: 94% 92%  94%    Physical Exam: General: No acute distress, obese. Lungs: Clear to auscultation bilaterally without wheezes or crackles Cardiovascular: Regular rate and rhythm without murmur gallop or rub normal S1 and S2 Abdomen: Nontender,distended not tight, soft, bowel sounds positive Extremities: No significant cyanosis, clubbing.  1+ edema bilaterally in  lower extremities  Basic Metabolic Panel:  Lab 02/20/12 4098 02/19/12 1826  NA 138 137  K 3.2* 3.9  CL 101 98  CO2 27 27  GLUCOSE 122* 94  BUN 14 19  CREATININE 0.78 0.78  CALCIUM 8.9 9.3  MG -- --  PHOS -- --   Liver Function Tests:  Lab 02/19/12 1826  AST 28  ALT 22  ALKPHOS 84  BILITOT 0.3  PROT 7.0  ALBUMIN 3.5    Lab 02/19/12 1826  LIPASE 8*  AMYLASE --   CBC:  Lab 02/20/12 1020 02/19/12 1826  WBC 8.2 10.7*  NEUTROABS -- --  HGB 10.3* 10.0*  HCT 33.7* 32.4*  MCV 76.9* 76.4*  PLT 272 269   CBG:  Lab 02/20/12 0746  GLUCAP 91   Coagulation:  Lab 02/19/12 1826  LABPROT 13.4  INR 1.00   Anemia Panel:  Lab 02/19/12 1826    VITAMINB12 341  FOLATE 14.5  FERRITIN 7*  TIBC 383  IRON 34*  RETICCTPCT 1.9    Micro Results: Recent Results (from the past 240 hour(s))  MRSA PCR SCREENING     Status: Normal   Collection Time   02/20/12 12:15 AM      Component Value Range Status Comment   MRSA by PCR NEGATIVE  NEGATIVE  Final     Studies/Results: Scheduled Meds:    . amLODipine  5 mg Oral Daily  . budesonide-formoterol  2 puff Inhalation BID  . calcium carbonate  3 tablet Oral NOW  . cholecalciferol  2,000 Units Oral Daily  . clonazePAM  1 mg Oral TID  . cyanocobalamin  1,000 mcg Intramuscular Once  . cyclobenzaprine  5-10 mg Oral TID  . enoxaparin  40 mg Subcutaneous Q24H  . hydrochlorothiazide  12.5 mg Oral Daily  . hydrOXYzine  50 mg Oral TID  . levothyroxine  75 mcg Oral QHS  . losartan  100 mg Oral Daily  . omega-3 acid ethyl esters  1 g Oral Daily  . pantoprazole  40 mg Oral  Q1200  . polyethylene glycol  17 g Oral Q1 Hr x 5  . polyethylene glycol  17 g Oral Q1H  . potassium chloride  20 mEq Oral BID  . predniSONE  5 mg Oral Daily  . senna  1 tablet Oral BID  . sodium phosphate  1 enema Rectal Once  . vitamin B-1  250 mg Oral Daily  . valACYclovir  1,000 mg Oral BID  . DISCONTD: polyethylene glycol  17 g Oral Daily   Continuous Infusions:    . DISCONTD: sodium chloride 100 mL/hr at 02/20/12 0532   PRN Meds:.acetaminophen, acetaminophen, albuterol, benzonatate, hydrocortisone, levalbuterol, morphine, morphine, nitroGLYCERIN, ondansetron (ZOFRAN) IV, ondansetron  Anti-infectives:  Anti-infectives     Start     Dose/Rate Route Frequency Ordered Stop   02/19/12 2200   valACYclovir (VALTREX) tablet 1,000 mg  Status:  Discontinued        1,000 mg Oral 2 times daily 02/19/12 1719 02/19/12 1753   02/19/12 2200   valACYclovir (VALTREX) tablet 1,000 mg        1,000 mg Oral 2 times daily 02/19/12 1754            Assessment/Plan: Principal Problem:  *Abdominal pain Active Problems:   OBESITY  Addison's disease  Hypertension  Thyroid disease  Shingles    LOS: 2 days   Abdominal pain - epigastric, of uncertain etiology. On omeprazole.  - Reports last EGD with Dr. Madilyn Fireman was negative for source of pain.  -Gerd symptoms with likely esophageal spasm. - constipation - will give stool softeners again this am. (1/2 go-lytely prep) - On chronic narcotics - will arrange outpatient follow up with Dr. Madilyn Fireman [facility]).  Also raise hob, bid PPI, and consider PRN Nifedipine.  Microcytic Anemia - scheduled for out patient work up with Dr. Darrold Span.  Will guiac stool.  Hypertension - Amlodipine and HCTZ  Gastroesophageal reflux disease. - Omeprazole.  Hx of Hypothyroidism.   Obesity  Adrenal insufficiency on chronic prednisone therapy as directed by endocrinologist at [facility].   Chronic back pain, followed in the Pain Clinic by Dr. Vear Clock  DVT Prophylaxis: lovenox   Stephani Police 02/21/2012, 7:46 AM [phone] I have personally seen and examined Ms. Ebey I agree with PE/AP as above Skyra Crichlow

## 2012-02-21 NOTE — Progress Notes (Signed)
INITIAL ADULT NUTRITION ASSESSMENT Date: 02/21/2012   Time: 12:21 PM Reason for Assessment: Nutrition Risk, unintentional weight loss  ASSESSMENT: Female 61 y.o.  Dx: Abdominal pain  Hx:  Past Medical History  Diagnosis Date  . Asthma   . Hypertension   . Shingles   . Sleep apnea   . OSA on CPAP   . Adenocarcinoma of breast 07/13/1992  . Hypothyroidism   . H/O esophageal spasm     "I can have as many as 65 TUMS before they stop"  . Radiation adverse effect     "100% blockage on left; 50% on right salivary; can drain w/massage"  . Diabetes mellitus     "diet controlled"  . Restless leg syndrome, controlled 02/19/12    "much better since being on CPAP; been on that ~ 1 month now"  . COPD (chronic obstructive pulmonary disease)   . Pneumonia     "I've had it several times"  . Chronic bronchitis   . Positive TB test     "ran deep in my father's family; went on INH for awhile; it caused migraines"  . Adrenal insufficiency     "w/secondary Addison's"  . Anemia   . GERD (gastroesophageal reflux disease)   . H/O hiatal hernia   . Upper GI bleeding 1970-1980    "had 4"  . Hepatitis B infection 1971    "in high school; after drinking after football player"  . Migraines     "much better now" (02/19/12)  . Headache     "when I don't eat" (02/19/12)  . Anxiety   . Arthritis   . Fibromyalgia   . Multiple sclerosis 05/1988    "dr's in [place] don't think I have it; dr's in [place] still do" (02/19/12)    Related Meds:     . amLODipine  5 mg Oral Daily  . budesonide-formoterol  2 puff Inhalation BID  . calcium carbonate  3 tablet Oral NOW  . cholecalciferol  2,000 Units Oral Daily  . clonazePAM  1 mg Oral TID  . cyanocobalamin  1,000 mcg Intramuscular Once  . cyclobenzaprine  5-10 mg Oral TID  . dicyclomine  20 mg Oral TID AC & HS  . docusate sodium  200 mg Oral Daily  . enoxaparin  40 mg Subcutaneous Q24H  . hydrochlorothiazide  12.5 mg Oral Daily  . hydrOXYzine  50 mg  Oral TID  . levalbuterol  2 puff Inhalation BID  . levothyroxine  75 mcg Oral QHS  . losartan  100 mg Oral Daily  . omega-3 acid ethyl esters  1 g Oral Daily  . pantoprazole  40 mg Oral BID AC  . polyethylene glycol  17 g Oral Q1 Hr x 5  . polyethylene glycol  17 g Oral Q1H  . polyethylene glycol  17 g Oral Q1H  . potassium chloride  20 mEq Oral BID  . predniSONE  5 mg Oral Daily  . senna  1 tablet Oral BID  . sodium phosphate  1 enema Rectal Once  . vitamin B-1  250 mg Oral Daily  . valACYclovir  1,000 mg Oral BID  . DISCONTD: pantoprazole  40 mg Oral Q1200     Ht: 5\' 3"  (160 cm)  Wt: 201 lb 1 oz (91.2 kg)  Ideal Wt: 52.3 kg % Ideal Wt: 174%  Usual Wt:  Wt Readings from Last 3 Encounters:  02/19/12 201 lb 1 oz (91.2 kg)  11/21/11 220 lb 10.9 oz (100.1 kg)  unsure of usual weight, does not weight at home, notices lots of fluid retention at time. Thinks about 200 might be normal for her recently   % Usual Wt: 100%  Body mass index is 35.62 kg/(m^2). Obesity unspecified  Food/Nutrition Related Hx: Decreased Po intake lately, full quickly. Constipated, likely causing early satiety. Thinks she lost weight?  Labs:  CMP     Component Value Date/Time   NA 138 02/20/2012 1020   K 3.2* 02/20/2012 1020   CL 101 02/20/2012 1020   CO2 27 02/20/2012 1020   GLUCOSE 122* 02/20/2012 1020   BUN 14 02/20/2012 1020   CREATININE 0.78 02/20/2012 1020   CALCIUM 8.9 02/20/2012 1020   PROT 7.0 02/19/2012 1826   ALBUMIN 3.5 02/19/2012 1826   AST 28 02/19/2012 1826   ALT 22 02/19/2012 1826   ALKPHOS 84 02/19/2012 1826   BILITOT 0.3 02/19/2012 1826   GFRNONAA 90* 02/20/2012 1020   GFRAA >90 02/20/2012 1020     Intake/Output Summary (Last 24 hours) at 02/21/12 1224 Last data filed at 02/20/12 1400  Gross per 24 hour  Intake    360 ml  Output      0 ml  Net    360 ml      Diet Order: Cardiac  Supplements/Tube Feeding: none  IVF:    DISCONTD: sodium chloride Last Rate: 100 mL/hr at  02/20/12 0532    Estimated Nutritional Needs:   Kcal:  1500-1700 Protein: 75-85 gm Fluid:  > 2 L Patient recalled a long and complex hx to me. States that she used to be very small, very active and fit. Since starting on many medications for pain started to gain weight. Is trying to reduce the amount of pain medications and other to help with her weight. States she eats very limited amount of foods, mostly fruit and vegetables, limits sugar/carbs. Pt states that he retains fluid easily, RD recommended limiting sodium intake to help with fluid retention, also increasing fiber and fluid intake to help with constipation.  Patient has had several medications today to help with BM's, patient still has large firm abdomin. RD advised that chronic pain medications can increase constipation, as well as taking Iron supplements. Patient aware and takes senokot at home to help with constipation. Patient would like to remain in the hospital until she feels as though she is no longer constipated.   NUTRITION DIAGNOSIS: -Predicted suboptimal nutrient intake (NI-5.11.1).  Status: Ongoing  RELATED TO: bloating, constipation  AS EVIDENCE BY: early satiety  MONITORING/EVALUATION(Goals): Goal: Patient will be able to consume >50% of most meals  Monitor: Po intake, weight, labs, education needs  EDUCATION NEEDS: -No education needs identified at this time  INTERVENTION: 1. Encouraged patient to increase fiber and water intake and decrease sodium in diet 2. RD will continue to follow.   Dietitian [phone]  DOCUMENTATION CODES Per approved criteria  -Obesity Unspecified    Clarene Duke MARIE 02/21/2012, 12:21 PM

## 2012-02-22 LAB — BASIC METABOLIC PANEL
BUN: 14 mg/dL (ref 6–23)
CO2: 26 mEq/L (ref 19–32)
Calcium: 9.5 mg/dL (ref 8.4–10.5)
GFR calc non Af Amer: 76 mL/min — ABNORMAL LOW (ref >90)
Glucose, Bld: 110 mg/dL — ABNORMAL HIGH (ref 70–99)

## 2012-02-22 MED ORDER — SENNA 8.6 MG PO TABS: 1.0000 | ORAL_TABLET | Freq: Two times a day (BID) | ORAL | Status: DC

## 2012-02-22 MED ORDER — POTASSIUM CHLORIDE CRYS ER 20 MEQ PO TBCR: 20.0000 meq | EXTENDED_RELEASE_TABLET | Freq: Two times a day (BID) | ORAL | Status: DC

## 2012-02-22 MED ORDER — MAGNESIUM CHLORIDE ER 535 (64 MG) MG PO TBCR: 1.0000 | EXTENDED_RELEASE_TABLET | Freq: Two times a day (BID) | ORAL | Status: DC

## 2012-02-22 MED ORDER — FUROSEMIDE 20 MG PO TABS: 20.0000 mg | ORAL_TABLET | Freq: Every day | ORAL | Status: DC

## 2012-02-22 MED ORDER — POLYETHYLENE GLYCOL 3350 17 GM/SCOOP PO POWD: 34.0000 g | Freq: Two times a day (BID) | ORAL | Status: AC

## 2012-02-22 NOTE — Progress Notes (Signed)
   CARE MANAGEMENT NOTE 02/22/2012  Patient:  Daisy [name], Daisy [name]   Account Number:  [id number]  Date Initiated:  02/20/2012  Documentation initiated by:  Letha Cape  Subjective/Objective Assessment:   dx abd pain  admit- lives with spouse.  pta independent.     Action/Plan:   Anticipated DC Date:  02/22/2012   Anticipated DC Plan:  HOME/SELF CARE      DC Planning Services  CM consult      Choice offered to / List presented to:             Status of service:  In process, will continue to follow Medicare Important Message given?   (If response is "NO", the following Medicare IM given date fields will be blank) Date Medicare IM given:   Date Additional Medicare IM given:    Discharge Disposition:    Per UR Regulation:    If discussed at Long Length of Stay Meetings, dates discussed:    Comments:  02/22/12 10:31 Letha Cape RN, BSN [phone] patient for dc today, no needs identified, patient is independent.   02/20/12 11:25 Letha Cape RN, BSN [phone] patient lives with spouse, pta independent, patient has a cpap at home.  Patient for possible dc tomorrow.  Patient states she had medication coverage and transportation.

## 2012-02-22 NOTE — Progress Notes (Signed)
   CARE MANAGEMENT NOTE 02/22/2012  Patient:  Gladden,Derotha K   Account Number:  [id number]  Date Initiated:  02/20/2012  Documentation initiated by:  Firman Petrow  Subjective/Objective Assessment:   dx abd pain  admit- lives with spouse.  pta independent.     Action/Plan:   Anticipated DC Date:  02/22/2012   Anticipated DC Plan:  HOME/SELF CARE      DC Planning Services  CM consult      Choice offered to / List presented to:             Status of service:  In process, will continue to follow Medicare Important Message given?   (If response is "NO", the following Medicare IM given date fields will be blank) Date Medicare IM given:   Date Additional Medicare IM given:    Discharge Disposition:    Per UR Regulation:    If discussed at Long Length of Stay Meetings, dates discussed:    Comments:  02/22/12 10:31 Mishal Probert RN, BSN [phone] patient for dc today, no needs identified, patient is independent.   02/20/12 11:25 Margalit Leece RN, BSN [phone] patient lives with spouse, pta independent, patient has a cpap at home.  Patient for possible dc tomorrow.  Patient states she had medication coverage and transportation.    

## 2012-02-22 NOTE — Progress Notes (Deleted)
Pt came to nurses station and stated that she saved a bowel movement in a styrofoam cup and "poked it with a fork to see how hard it was". Pt reports hard stools.

## 2012-02-22 NOTE — Discharge Summary (Signed)
Physician Discharge Summary  Patient ID: JLA REYNOLDS MRN: [id number] DOB/AGE: 60/31/1952 60 y.o.  Admit date: 02/19/2012 Discharge date: 02/22/2012    Discharge Diagnoses:  Principal Problem:  *Abdominal pain Active Problems:  OBESITY  Addison's disease  Hypertension  Thyroid disease  Shingles   Consults:  Brief Summary: Daisy [name] is a 61 y.o. y/o female with a PMH of Addison's disease-on chronic prednisone, HTN, Diet controlled DM, Hypothyroidism,Recurrent Shingles, Chronic Pain Syndrome, ?prior h/o chronic pancreatitis, Fe def anemia, h/o breast cancer-s/p left mastectomy who presented to the ED on 3/18 with complaints of abdominal pain. Per patient she had been having much more than usual abdominal pain for almost a month now, however had worsened over the past few days. She claims that she felt constipated. No diarrhea. No vomiting but feels nauseous. She has been only drinking liquids for the past few days. She went to her PCP  who referred the patient to the hospital for further evaluation and treatment. There was an initial concern for recurrent pancreatitis. However, workup has only been significant for moderate constipation.    Hospital Course by Discharge Summary  Abdominal pain - epigastric, of uncertain etiology. On omeprazole.  - Reports last EGD with Dr. Madilyn Fireman was negative for source of pain.  -Gerd symptoms with likely esophageal spasm.  - constipation treated with go-lytely (1/2 prep), Miralax and stool softers - On chronic narcotics that is likely contributing to constipation - Outpt follow-up with GI as listed below  Microcytic Anemia - scheduled for out patient work up with Dr. Darrold Span. Will guiac stool.   Hypertension - Amlodipine and HCTZ   Gastroesophageal reflux disease. - Omeprazole.   Hx of Hypothyroidism. Continue Synthroid  Obesity   Adrenal insufficiency on chronic prednisone therapy as directed by endocrinologist at [facility].   Chronic  back pain, followed in the Pain Clinic by Dr. Vear Clock  Generalized edema, mild: related to steroids and IVFs. Will add low dose lasix x 1 week. Follow-up with PCP to determine need for further treatment   Significant Diagnostic Studies:  *RADIOLOGY REPORT*  Clinical Data: Abdominal pain. Addison's disease.  ACUTE ABDOMEN SERIES (ABDOMEN 2 VIEW & CHEST 1 VIEW)  Comparison: CT abdomen and pelvis 11/21/2011.  Findings: There is no evidence of dilated bowel loops or free  intraperitoneal air. No radiopaque calculi or other significant  radiographic abnormality is seen. Heart size and mediastinal  contours are within normal limits. Both lungs are clear. Dorsal  column stimulator. Left mastectomy and axillary node dissection.  Cervical fusion. Moderate stool burden. Cholecystectomy. Renal  calculi seen on the prior CT are obscured by stool but are likely  still present. Moderate thoracic scoliosis convex right with  compensatory changes to the left.  IMPRESSION: No acute abdominal findings. No acute cardiopulmonary  disease.  Original Report Authenticated By: Elsie Stain, M.D   Discharge Exam: General: Awake, alert in NAD, standing in room CV: S1S2 RRR, no m/r/g Resp: CTAB. No w/r/c, no increased wob GI: abdomen soft but slightly distended. BS+. No appreciated masses or hsm Neuro: no focal m/s deficits on exam Psych: AOx4, normal mood and affect.    Discharge Labs  BMET  Lab 02/22/12 0651 02/20/12 1020 02/19/12 1826  NA 136 138 137  K 4.1 3.2* --  CL 98 101 98  CO2 26 27 27   GLUCOSE 110* 122* 94  BUN 14 14 19   CREATININE 0.83 0.78 0.78  CALCIUM 9.5 8.9 9.3  MG -- -- --  PHOS -- -- --  CBC   Lab 02/20/12 1020 02/19/12 1826  HGB 10.3* 10.0*  HCT 33.7* 32.4*  WBC 8.2 10.7*  PLT 272 269      Discharge Orders    Future Appointments: Provider: Department: Dept Phone: Center:   03/22/2012 10:00 AM Alvina Filbert [facility] [phone] None    03/22/2012 10:30 AM Lennis Buzzy Han, MD [facility] [phone] None     Future Orders Please Complete By Expires   Diet - low sodium heart healthy      Increase activity slowly          Follow-up Information    Follow up with Barrie Folk, MD on 02/27/2012. (at 2:15 pm.)    Contact information:   [address], [address] [employer], [place]. [facility] [place] [address] [phone]       Schedule an appointment as soon as possible for a visit with Daisy Floro, MD.   Contact information:   [address] [place] [address] [phone]            Sybrina, Laning  Home Medication Instructions [id number]   Printed on:02/22/12 1045  Medication Information                    ondansetron (ZOFRAN) 4 MG tablet Take 8 mg by mouth every 8 (eight) hours as needed. For nausea           riboflavin (VITAMIN B-2) 100 MG TABS Take 100 mg by mouth daily.             Hydrocortisone (CORTIZONE-10 ECZEMA EX) Apply 1 application topically 2 (two) times daily as needed. For eczema            clonazePAM (KLONOPIN) 1 MG tablet Take 1 mg by mouth 3 (three) times daily.             benzonatate (TESSALON) 200 MG capsule Take 200 mg by mouth 3 (three) times daily as needed. cough            budesonide-formoterol (SYMBICORT) 160-4.5 MCG/ACT inhaler Inhale 2 puffs into the lungs 2 (two) times daily.             levalbuterol (XOPENEX HFA) 45 MCG/ACT inhaler Inhale 2 puffs into the lungs every 4 (four) hours as needed. Shortness of breath.            losartan-hydrochlorothiazide (HYZAAR) 100-12.5 MG per tablet Take 1 tablet by mouth daily.             hydrOXYzine (ATARAX/VISTARIL) 50 MG tablet Take 50 mg by mouth 3 (three) times daily. For itching           amLODipine (NORVASC) 5 MG tablet Take 5 mg by mouth daily.             valACYclovir (VALTREX) 1000 MG tablet Take 1,000 mg by mouth 2 (two) times daily.              cyclobenzaprine (FLEXERIL) 10 MG tablet Take 5-10 mg by mouth 3 (three) times daily. Muscle spasms           Calcium Carbonate-Vitamin D (CALTRATE 600+D PO) Take 1 tablet by mouth daily.            fish oil-omega-3 fatty acids 1000 MG capsule Take 1 g by mouth daily.             levothyroxine (SYNTHROID, LEVOTHROID) 75 MCG tablet Take 75 mcg by mouth at  bedtime.            predniSONE (DELTASONE) 5 MG tablet Take 5 mg by mouth daily.             morphine (MSIR) 30 MG tablet Take 60 mg by mouth every 4 (four) hours as needed. Pain.           Thiamine HCl (VITAMIN B-1) 250 MG tablet Take 250 mg by mouth daily.           Cholecalciferol (VITAMIN D3) 2000 UNITS TABS Take 1 tablet by mouth daily.           omeprazole (PRILOSEC) 40 MG capsule Take 40 mg by mouth at bedtime.              Disposition: 01-Home or Self Care  Discharged Condition: HUXLEY VANWAGONER has met maximum benefit of inpatient care and is medically stable and cleared for discharge.  Patient is pending follow up as above.      Time spent on disposition:  Greater than 35 minutes.   Cordelia Pen, NP-C [facility]  pgr [phone]  I have personally seen and examined Ms. Sallye Ober  I supervised Cordelia Pen and directed the entire plan of care and discharge process personally    Paco Cislo

## 2012-03-11 ENCOUNTER — Other Ambulatory Visit: Payer: Self-pay | Admitting: Family Medicine

## 2012-03-11 ENCOUNTER — Ambulatory Visit
Admission: RE | Admit: 2012-03-11 | Discharge: 2012-03-11 | Disposition: A | Discharge status: Home / Self Care | Payer: BC Managed Care – PPO | Source: Ambulatory Visit | Attending: Family Medicine | Admitting: Family Medicine

## 2012-03-11 DIAGNOSIS — M419 Scoliosis, unspecified: Secondary | ICD-10-CM

## 2012-03-11 DIAGNOSIS — R52 Pain, unspecified: Secondary | ICD-10-CM

## 2012-03-22 ENCOUNTER — Encounter: Payer: Self-pay | Admitting: Oncology

## 2012-03-22 ENCOUNTER — Telehealth: Payer: Self-pay | Admitting: Oncology

## 2012-03-22 ENCOUNTER — Ambulatory Visit (HOSPITAL_BASED_OUTPATIENT_CLINIC_OR_DEPARTMENT_OTHER): Payer: BC Managed Care – PPO | Admitting: Oncology

## 2012-03-22 ENCOUNTER — Other Ambulatory Visit (HOSPITAL_BASED_OUTPATIENT_CLINIC_OR_DEPARTMENT_OTHER): Payer: BC Managed Care – PPO

## 2012-03-22 VITALS — BP 150/88 | HR 122 | Temp 98.7°F | Ht 63.0 in | Wt 203.1 lb

## 2012-03-22 DIAGNOSIS — D0510 Intraductal carcinoma in situ of unspecified breast: Secondary | ICD-10-CM

## 2012-03-22 DIAGNOSIS — D509 Iron deficiency anemia, unspecified: Secondary | ICD-10-CM

## 2012-03-22 DIAGNOSIS — D0590 Unspecified type of carcinoma in situ of unspecified breast: Secondary | ICD-10-CM

## 2012-03-22 DIAGNOSIS — R0602 Shortness of breath: Secondary | ICD-10-CM

## 2012-03-22 DIAGNOSIS — G8929 Other chronic pain: Secondary | ICD-10-CM

## 2012-03-22 LAB — CBC WITH DIFFERENTIAL/PLATELET
Eosinophils Absolute: 0.3 10*3/uL (ref 0.0–0.5)
HCT: 34.4 % — ABNORMAL LOW (ref 34.8–46.6)
LYMPH%: 14.2 % (ref 14.0–49.7)
MCV: 75.9 fL — ABNORMAL LOW (ref 79.5–101.0)
MONO#: 0.7 10*3/uL (ref 0.1–0.9)
MONO%: 6.2 % (ref 0.0–14.0)
NEUT#: 8.8 10*3/uL — ABNORMAL HIGH (ref 1.5–6.5)
NEUT%: 76.7 % (ref 38.4–76.8)
Platelets: 344 10*3/uL (ref 145–400)
RBC: 4.53 10*6/uL (ref 3.70–5.45)

## 2012-03-22 NOTE — Patient Instructions (Signed)
Integra (iron)   Take 1 tablet with 1 tablet of Vitamin C twice daily, one hour before meals or two hours after meals.

## 2012-03-22 NOTE — Telephone Encounter (Signed)
Pt is aware of appt on July 16th, lab and  MD

## 2012-03-22 NOTE — Progress Notes (Signed)
OFFICE PROGRESS NOTE Date of Visit 03-22-2012 Physicians: C.Duane Lope, T.Rosenbower, J.Hayes, Nathen May [facility])  INTERVAL HISTORY:  Patient is seen, together with husband, in scheduled follow up of her history of DCIS left breast, also recently found to have iron deficiency anemia. 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 She additionally has history of T1N0 ER/PR positive left breast cancer in 1993, treated with lumpectomy and axillary node evaluation and adjuvant adriamycin/ cytoxan by Dr.Neijstrom, local radiation, then unable to tolerate tamoxifen due to migraines. Most recent mammograms were at [facility] 12-19-2011 with no mammographic findings of concern on right.  Patient was seen in ongoing follow up by [facility] endocrinology in Feb 2013, had a syncopal episode during that outpatient visit and was found to be anemic. She has brought labs from [facility] 01-09-2012 with Hgb10.9 WBC 11.7, plt 419k, MCV 78, B12  210,serum iron 21 and %sat 5. I believe that she had follow up with Dr.John Madilyn Fireman after this information, apparently recent endoscopies (Aug 2012) and hemoccult negative such that he did not repeat any studies. She previously used OTC iron with meals, but has not used this in probably several weeks. She has not been aware of any overt bleeding. Otherwise, she was hospitalized in [place] thru 02-22-2012 with abdominal pain which appeared secondary to constipation (on chronic narcotics from pain center). She had CT AP 11-21-11, then plain Xrays during the hospitalization which showed significant scoliosis. The abdominal discomfort improved improvement in constipation.   On Review of Systems otherwise: no headaches as long as she uses morphine daily from pain center. "Eats very small amounts" which is unchanged. No new or  different pain. No noted changes on breast self-exam. Short of breath on exertion as she has been. Tired today. No cough, no increased LE swelling, No different skin complaints, slept poorly last pm  Remainder of 10 point Review of Systems negative/ unchanged. Objective:  Vital signs in last 24 hours:  BP 150/88  Pulse 122  Temp(Src) 98.7 F (37.1 C) (Oral)  Ht 5\' 3"  (1.6 m)  Wt 203 lb 1.6 oz (92.126 kg)  BMI 35.98 kg/m2 Alert, slowly ambulatory, husband very supportive, NAD in exam room.  HEENT:mucous membranes moist, pharynx normal without lesions. PERRL, not icteric. Not markedly pale LymphaticsCervical, supraclavicular, and axillary nodes normal. Resp: clear to auscultation bilaterally and normal percussion bilaterally Cardio: regular rate and rhythm GI: obese, soft, nontender, nothing palpable, few bowel sounds Extremities: extremities normal, atraumatic, no cyanosis or edema Breasts: left mastectomy scar not remarkable, nothing in left axilla, no swelling SUE. Right breast without masses, skin changes, nipple discharge.  Lab Results:   Basename 03/22/12 1011  WBC 11.5*  HGB 10.6*  HCT 34.4*  PLT 344   ANC 8.8   MCV 75.9 BMET not repeated  UA 11-20-2012 negative for blood Studies/Results:  Abdominal Xrays March 2013 noted as above Medications: I have reviewed the patient's current medications. We have discussed oral vs IV iron, including possible side effects, interventions to improve oral absorption and preparations. We have decided to begin with oral iron using Integra bid on empty stomach with Vitamin C (cannot tolerate OJ). She understands that it may take months on oral iron to replenish stores. Source on blood loss is not clear, tho certainly she has lots of blood draws repetitively.   Assessment/Plan: 1. DCIS left breast: no findings  of concern, continuing observation post treatment as above. 2.past breast cancer as above 3.iron deficiency anemia: no GI bleeding  documented and recent urinalysis negative; may be from long-term repetitive blood draws. Plan as above 4.morbid obesity 5. Addison's disease 6.GERD 7. Hypothyroidism on replacement 8.migraines and chronic pain, on chronic narcotics by pain clinic Patient and husband were comfortable with plan as above. I will see her back in ~ 3 months with recheck hgb and iron.   Anthonio Mizzell P, MD   03/22/2012, 8:50 PM

## 2012-03-25 ENCOUNTER — Telehealth: Payer: Self-pay

## 2012-03-25 NOTE — Telephone Encounter (Signed)
Informed Tresa Endo, [facility], that per Dr. Darrold Span, ok for pt to have Integra Plus.  She verbalizes understanding.

## 2012-03-25 NOTE — Telephone Encounter (Signed)
Received call from [facility] [address], stating that they wanted to clarify prescription for Integra written for pt 03/22/12.  Pharmacist states this comes as "plain" Integra, Integra F 125-1-40-3, and Integra Plus 125-1.  She states "plain" Integra is on manufacturer back-order, and the only one they have in stock now is Integra Plus, but they can order Integra F.  Note to MD for review.

## 2012-03-29 ENCOUNTER — Other Ambulatory Visit: Payer: Self-pay

## 2012-03-29 DIAGNOSIS — D649 Anemia, unspecified: Secondary | ICD-10-CM

## 2012-03-29 MED ORDER — INTEGRA 62.5-62.5-40-3 MG PO CAPS: 1.0000 | ORAL_CAPSULE | Freq: Two times a day (BID) | ORAL | Status: DC

## 2012-04-05 ENCOUNTER — Other Ambulatory Visit: Payer: Self-pay | Admitting: Lab

## 2012-04-05 ENCOUNTER — Ambulatory Visit: Payer: Self-pay | Admitting: Oncology

## 2012-04-13 ENCOUNTER — Other Ambulatory Visit: Payer: Self-pay | Admitting: Internal Medicine

## 2012-06-18 ENCOUNTER — Encounter: Payer: Self-pay | Admitting: Oncology

## 2012-06-18 ENCOUNTER — Ambulatory Visit (HOSPITAL_BASED_OUTPATIENT_CLINIC_OR_DEPARTMENT_OTHER): Payer: BC Managed Care – PPO | Admitting: Oncology

## 2012-06-18 ENCOUNTER — Other Ambulatory Visit (HOSPITAL_BASED_OUTPATIENT_CLINIC_OR_DEPARTMENT_OTHER): Payer: BC Managed Care – PPO

## 2012-06-18 VITALS — BP 115/68 | HR 90 | Temp 98.4°F | Ht 63.0 in | Wt 200.3 lb

## 2012-06-18 DIAGNOSIS — G8929 Other chronic pain: Secondary | ICD-10-CM

## 2012-06-18 DIAGNOSIS — D0590 Unspecified type of carcinoma in situ of unspecified breast: Secondary | ICD-10-CM

## 2012-06-18 DIAGNOSIS — D509 Iron deficiency anemia, unspecified: Secondary | ICD-10-CM | POA: Insufficient documentation

## 2012-06-18 DIAGNOSIS — D0510 Intraductal carcinoma in situ of unspecified breast: Secondary | ICD-10-CM

## 2012-06-18 DIAGNOSIS — C50019 Malignant neoplasm of nipple and areola, unspecified female breast: Secondary | ICD-10-CM

## 2012-06-18 LAB — IRON AND TIBC
%SAT: 12 % — ABNORMAL LOW (ref 20–55)
Iron: 35 ug/dL — ABNORMAL LOW (ref 42–145)
TIBC: 298 ug/dL (ref 250–470)
UIBC: 263 ug/dL (ref 125–400)

## 2012-06-18 LAB — CBC WITH DIFFERENTIAL/PLATELET
Basophils Absolute: 0 10*3/uL (ref 0.0–0.1)
EOS%: 0.8 % (ref 0.0–7.0)
Eosinophils Absolute: 0.1 10*3/uL (ref 0.0–0.5)
HCT: 40.2 % (ref 34.8–46.6)
HGB: 13.4 g/dL (ref 11.6–15.9)
MONO#: 0.5 10*3/uL (ref 0.1–0.9)
NEUT#: 7.9 10*3/uL — ABNORMAL HIGH (ref 1.5–6.5)
NEUT%: 78.9 % — ABNORMAL HIGH (ref 38.4–76.8)
RDW: 18.7 % — ABNORMAL HIGH (ref 11.2–14.5)
WBC: 10 10*3/uL (ref 3.9–10.3)
lymph#: 1.5 10*3/uL (ref 0.9–3.3)

## 2012-06-18 LAB — FERRITIN: Ferritin: 32 ng/mL (ref 10–291)

## 2012-06-18 NOTE — Patient Instructions (Signed)
Decrease iron to once daily until next CBC with Dr Rosalita Chessman in late August

## 2012-06-18 NOTE — Progress Notes (Signed)
OFFICE PROGRESS NOTE   06/18/2012   Physicians:C.Duane Lope, T.Rosenbower, J.Hayes, Nathen May [facility])   INTERVAL HISTORY:  Patient is seen, together with friend, in follow up of iron deficiency anemia, also history of DCIS left breast and previous stage 1 left breast cancer. She has been on oraliron (Integra) bid with Vit C since 03-22-12. Source of blood loss seems to be multiple blood draws related to her multiple medical problems. She is tolerating the oral iron with no difficulty and hemoglobin/ MCV are much improved today. Hemoccult cards were negative and she had unremarkable endoscopies by Dr Madilyn Fireman in Aug 2012; urinalysis was negative for blood.  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 She additionally has history of T1N0 ER/PR positive left breast cancer in 1993, treated with lumpectomy and axillary node evaluation and adjuvant adriamycin/ cytoxan by Dr.Neijstrom, local radiation, then unable to tolerate tamoxifen due to migraines. Most recent mammograms were at [facility] 12-29-2011 with no mammographic findings of concern on right.  Patient has continued with gradual intentional weight loss, now total 30 lbs since last fall. She was treated for bronchitis in past few weeks, antibiotics completed and symptoms better other than persistent deep, nonproductive cough. She denies fever or shortness of breath and has no chest pain. She has seen no bleeding. She has had no change left mastectomy area or right breast on self exam. Remainder of 10 point Review of Systems negative.  Objective:  Vital signs in last 24 hours:  BP 115/68  Pulse 90  Temp 98.4 F (36.9 C) (Oral)  Ht 5\' 3"  (1.6 m)  Wt 200 lb 4.8 oz (90.855 kg)  BMI 35.48 kg/m2 Weight is down 3 lbs since April and at least 24 lbs in past 3 years; patient reports  at least 30 lbs down. Ambulatory without assistance, respirations not labored RA but one episode of deep, NP-sounding coughing.   HEENT:PERRLA, sclera clear, anicteric and oropharynx clear, no lesions LymphaticsCervical, supraclavicular, and axillary nodes normal. Resp: clear to percussion, no wheezes or rales, breath sounds heard bilaterally, no use of accessory muscles Cardio: regular rate and rhythm GI: obese, soft, nontender, nothing palpable Extremities: no edema, cords, tenderness Neuro:no focal deficits Breast:left mastectomy scar well-healed without evidence of local recurrence, nothing in left axilla, no obvious swelling LUE. Right breast without masses, skin or nipple findings and axilla benign.   Lab Results:  Results for orders placed in visit on 06/18/12  CBC WITH DIFFERENTIAL      Component Value Range   WBC 10.0  3.9 - 10.3 10e3/uL   NEUT# 7.9 (*) 1.5 - 6.5 10e3/uL   HGB 13.4  11.6 - 15.9 g/dL   HCT 11.9  14.7 - 82.9 %   Platelets 251  145 - 400 10e3/uL   MCV 87.8  79.5 - 101.0 fL   MCH 29.3  25.1 - 34.0 pg   MCHC 33.3  31.5 - 36.0 g/dL   RBC 5.62  1.30 - 8.65 10e6/uL   RDW 18.7 (*) 11.2 - 14.5 %   lymph# 1.5  0.9 - 3.3 10e3/uL   MONO# 0.5  0.1 - 0.9 10e3/uL   Eosinophils Absolute 0.1  0.0 - 0.5 10e3/uL   Basophils Absolute 0.0  0.0 - 0.1 10e3/uL   NEUT% 78.9 (*) 38.4 - 76.8 %   LYMPH% 15.3  14.0 - 49.7 %  MONO% 4.6  0.0 - 14.0 %   EOS% 0.8  0.0 - 7.0 %   BASO% 0.4  0.0 - 2.0 %   nRBC 0  0 - 0 %    Iron studies resulted after visit with serum iron stable at 35 and % sat still low at 12, ferritin up to 32 from 7 in April.  Studies/Results:  No results found.  Medications: I have reviewed the patient's current medications. I have given prescription for tessalon perles for the residual cough. She will continue iron at least one daily until next visit here, as we try to replete stores well.  Assessment/Plan: 1. Iron deficiency anemia: likely related to  multiple blood draws over years with her multiple medical problems, improving well already with oral iron. Plan as above 2. 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 Previously intolerant to tamoxifen. 3.History of T1N0 ER/PR + left breast cancer 1993, not known recurrent. She will be due mammograms again in Jan at [place].  4.morbid obesity: she is so proud of losing weight. Note stopped drinking Ensure daily, as she had done for years. 5.hypothyroidism on replacement 6. GERD 7.chronic pain, followed by pain clinic    LIVESAY,LENNIS P, MD   06/18/2012, 1:20 PM

## 2012-06-19 ENCOUNTER — Other Ambulatory Visit: Payer: Self-pay | Admitting: *Deleted

## 2012-06-19 ENCOUNTER — Telehealth: Payer: Self-pay | Admitting: Oncology

## 2012-06-19 DIAGNOSIS — D649 Anemia, unspecified: Secondary | ICD-10-CM

## 2012-06-19 MED ORDER — BENZONATATE 100 MG PO CAPS: 100.0000 mg | ORAL_CAPSULE | Freq: Three times a day (TID) | ORAL | Status: AC | PRN

## 2012-06-19 NOTE — Telephone Encounter (Signed)
Called pt. To let her know that Hgb was better yesterday, but iron levels came back on later labs still low.  Pt. Is to continue iron twice daily until she sees Dr. Rosalita Chessman on Aug. 30th.  Then if Hgb is good, can decrease to one daily and then continue this until next visit with Dr. Cleophas Dunker. While talking with patient about this she requested that we call in tessalon perles.  Dr. Ila Mcgill gave her a script yesterday, but she would like to have [name] call it in so her husband does not have to wait at the pharmacy.  Told patient we would be glad to do that, however her husband will need to turn in the prescription to the pharmacy when he picks it up.  She verbalized understanding.

## 2012-06-19 NOTE — Telephone Encounter (Signed)
Talked to pt, gave her appt for next year July 2014 lab and MD, pt will have a mammorgram @ [facility] 12/19/12  10 am , pt is aware of appt

## 2012-06-26 ENCOUNTER — Other Ambulatory Visit: Payer: Self-pay

## 2012-06-26 ENCOUNTER — Telehealth: Payer: Self-pay | Admitting: *Deleted

## 2012-06-26 DIAGNOSIS — D509 Iron deficiency anemia, unspecified: Secondary | ICD-10-CM

## 2012-06-26 NOTE — Progress Notes (Signed)
Notified Ms. Massett that Dr. Darrold Span wants to see her in late January after her mammograms to follow up her iron levels.

## 2012-06-26 NOTE — Telephone Encounter (Signed)
Made appointment for mammogram on 12-19-2012 at 10:00am at [facility] made patient appointment for 01-07-2013 starting at 11:00am left voice message to inform the patient of the new date and time

## 2012-07-09 ENCOUNTER — Telehealth: Payer: Self-pay

## 2012-07-09 NOTE — Telephone Encounter (Signed)
Spoke with Daisy [name] and told her that Dr. Darrold Span received the iron studies done recently and she can continue 1 tab of iron daily with Vit. C.  She will have Dr. Rosalita Chessman send the results of her next iron studies in ~6 months to Dr. Darrold Span to evaluate her iron needs at that time.

## 2012-09-17 ENCOUNTER — Telehealth: Payer: Self-pay | Admitting: *Deleted

## 2012-09-17 ENCOUNTER — Telehealth: Payer: Self-pay | Admitting: Oncology

## 2012-09-17 ENCOUNTER — Other Ambulatory Visit: Payer: Self-pay | Admitting: Oncology

## 2012-09-17 DIAGNOSIS — D0510 Intraductal carcinoma in situ of unspecified breast: Secondary | ICD-10-CM

## 2012-09-17 NOTE — Telephone Encounter (Signed)
Talked to patient , gave her appt date for Genetic counseling 11/04/12

## 2012-09-17 NOTE — Telephone Encounter (Signed)
Pt left a lengthy message on voice mail regarding genetic testing. I discussed this with Dr Cleophas Dunker and she said it is fine for Daisy [name] to call the genetic counselors here at [facility]. Pt notified that she will put in a referral to the genetic clinic. Pt does want a referral to the genetic clinic.

## 2012-10-13 ENCOUNTER — Encounter (HOSPITAL_COMMUNITY): Payer: Self-pay | Admitting: Emergency Medicine

## 2012-10-13 ENCOUNTER — Emergency Department (HOSPITAL_COMMUNITY): Payer: BC Managed Care – PPO

## 2012-10-13 ENCOUNTER — Inpatient Hospital Stay (HOSPITAL_COMMUNITY)
Admission: EM | Admit: 2012-10-13 | Discharge: 2012-10-18 | DRG: 301 | Disposition: A | Discharge status: Home / Self Care | Payer: BC Managed Care – PPO | Attending: Emergency Medicine | Admitting: Emergency Medicine

## 2012-10-13 DIAGNOSIS — G43909 Migraine, unspecified, not intractable, without status migrainosus: Secondary | ICD-10-CM | POA: Diagnosis present

## 2012-10-13 DIAGNOSIS — I10 Essential (primary) hypertension: Secondary | ICD-10-CM

## 2012-10-13 DIAGNOSIS — E119 Type 2 diabetes mellitus without complications: Secondary | ICD-10-CM | POA: Diagnosis present

## 2012-10-13 DIAGNOSIS — R21 Rash and other nonspecific skin eruption: Secondary | ICD-10-CM | POA: Diagnosis present

## 2012-10-13 DIAGNOSIS — E272 Addisonian crisis: Secondary | ICD-10-CM

## 2012-10-13 DIAGNOSIS — E2749 Other adrenocortical insufficiency: Principal | ICD-10-CM | POA: Diagnosis present

## 2012-10-13 DIAGNOSIS — R112 Nausea with vomiting, unspecified: Secondary | ICD-10-CM

## 2012-10-13 DIAGNOSIS — E079 Disorder of thyroid, unspecified: Secondary | ICD-10-CM

## 2012-10-13 DIAGNOSIS — E039 Hypothyroidism, unspecified: Secondary | ICD-10-CM | POA: Diagnosis present

## 2012-10-13 DIAGNOSIS — R000 Tachycardia, unspecified: Secondary | ICD-10-CM | POA: Diagnosis present

## 2012-10-13 DIAGNOSIS — IMO0001 Reserved for inherently not codable concepts without codable children: Secondary | ICD-10-CM | POA: Diagnosis present

## 2012-10-13 DIAGNOSIS — R197 Diarrhea, unspecified: Secondary | ICD-10-CM | POA: Diagnosis present

## 2012-10-13 LAB — CBC WITH DIFFERENTIAL/PLATELET
Basophils Absolute: 0 10*3/uL (ref 0.0–0.1)
Basophils Relative: 0 % (ref 0–1)
Eosinophils Relative: 0 % (ref 0–5)
HCT: 45 % (ref 36.0–46.0)
Lymphocytes Relative: 17 % (ref 12–46)
MCHC: 34.9 g/dL (ref 30.0–36.0)
MCV: 89.8 fL (ref 78.0–100.0)
Monocytes Absolute: 0.6 10*3/uL (ref 0.1–1.0)
Neutro Abs: 8.2 10*3/uL — ABNORMAL HIGH (ref 1.7–7.7)
Platelets: 243 10*3/uL (ref 150–400)
RDW: 13 % (ref 11.5–15.5)
WBC: 10.6 10*3/uL — ABNORMAL HIGH (ref 4.0–10.5)

## 2012-10-13 LAB — URINALYSIS, MICROSCOPIC ONLY
Glucose, UA: NEGATIVE mg/dL
Ketones, ur: 40 mg/dL — AB
Leukocytes, UA: NEGATIVE
Nitrite: NEGATIVE
Specific Gravity, Urine: 1.01 (ref 1.005–1.030)
pH: 7 (ref 5.0–8.0)

## 2012-10-13 LAB — COMPREHENSIVE METABOLIC PANEL
Albumin: 3.5 g/dL (ref 3.5–5.2)
BUN: 12 mg/dL (ref 6–23)
Calcium: 9.3 mg/dL (ref 8.4–10.5)
Creatinine, Ser: 0.73 mg/dL (ref 0.50–1.10)
GFR calc Af Amer: >90 mL/min (ref >90)
Glucose, Bld: 84 mg/dL (ref 70–99)
Potassium: 4.2 mEq/L (ref 3.5–5.1)
Total Protein: 7.5 g/dL (ref 6.0–8.3)

## 2012-10-13 LAB — LACTIC ACID, PLASMA: Lactic Acid, Venous: 2 mmol/L (ref 0.5–2.2)

## 2012-10-13 MED ORDER — HEPARIN SODIUM (PORCINE) 5000 UNIT/ML IJ SOLN
5000.0000 [IU] | Freq: Three times a day (TID) | INTRAMUSCULAR | Status: DC
  Administered 2012-10-14 – 2012-10-18 (×13): 5000 [IU] via SUBCUTANEOUS
  Filled 2012-10-13 (×16): qty 1

## 2012-10-13 MED ORDER — DEXAMETHASONE SODIUM PHOSPHATE 10 MG/ML IJ SOLN
10.0000 mg | Freq: Once | INTRAMUSCULAR | Status: AC
  Administered 2012-10-13: 10 mg via INTRAVENOUS
  Filled 2012-10-13: qty 1

## 2012-10-13 MED ORDER — SODIUM CHLORIDE 0.9 % IV SOLN
INTRAVENOUS | Status: DC
  Administered 2012-10-14 (×2): via INTRAVENOUS
  Administered 2012-10-14: 100 mL/h via INTRAVENOUS
  Administered 2012-10-15: 10:00:00 via INTRAVENOUS

## 2012-10-13 MED ORDER — PROMETHAZINE HCL 25 MG/ML IJ SOLN
12.5000 mg | Freq: Once | INTRAMUSCULAR | Status: AC
  Administered 2012-10-13: 12.5 mg via INTRAVENOUS
  Filled 2012-10-13: qty 1

## 2012-10-13 MED ORDER — HYDROCORTISONE SOD SUCCINATE 100 MG IJ SOLR
100.0000 mg | Freq: Three times a day (TID) | INTRAMUSCULAR | Status: DC
  Administered 2012-10-14 (×2): 100 mg via INTRAVENOUS
  Filled 2012-10-13 (×5): qty 2

## 2012-10-13 MED ORDER — SODIUM CHLORIDE 0.9 % IV BOLUS (SEPSIS)
1000.0000 mL | Freq: Once | INTRAVENOUS | Status: AC
  Administered 2012-10-13: 1000 mL via INTRAVENOUS

## 2012-10-13 MED ORDER — LEVALBUTEROL HCL 0.63 MG/3ML IN NEBU
0.6300 mg | INHALATION_SOLUTION | Freq: Four times a day (QID) | RESPIRATORY_TRACT | Status: DC | PRN
  Filled 2012-10-13: qty 3

## 2012-10-13 MED ORDER — ONDANSETRON HCL 4 MG PO TABS: 4.0000 mg | ORAL_TABLET | Freq: Four times a day (QID) | ORAL | Status: DC | PRN

## 2012-10-13 MED ORDER — LEVOTHYROXINE SODIUM 100 MCG IV SOLR: 40.0000 ug | INTRAVENOUS | Status: DC

## 2012-10-13 MED ORDER — DIPHENHYDRAMINE HCL 50 MG/ML IJ SOLN
12.5000 mg | Freq: Once | INTRAMUSCULAR | Status: AC
  Administered 2012-10-13: 12.5 mg via INTRAVENOUS
  Filled 2012-10-13: qty 1

## 2012-10-13 MED ORDER — MORPHINE SULFATE 2 MG/ML IJ SOLN
1.0000 mg | INTRAMUSCULAR | Status: DC | PRN
  Administered 2012-10-14 (×2): 1 mg via INTRAVENOUS
  Filled 2012-10-13 (×2): qty 1

## 2012-10-13 MED ORDER — BUDESONIDE-FORMOTEROL FUMARATE 160-4.5 MCG/ACT IN AERO
2.0000 | INHALATION_SPRAY | Freq: Two times a day (BID) | RESPIRATORY_TRACT | Status: DC
  Administered 2012-10-14 – 2012-10-18 (×7): 2 via RESPIRATORY_TRACT
  Filled 2012-10-13: qty 6

## 2012-10-13 MED ORDER — ONDANSETRON HCL 4 MG/2ML IJ SOLN
4.0000 mg | Freq: Four times a day (QID) | INTRAMUSCULAR | Status: DC | PRN
  Filled 2012-10-13: qty 2

## 2012-10-13 MED ORDER — SODIUM CHLORIDE 0.9 % IJ SOLN
3.0000 mL | Freq: Two times a day (BID) | INTRAMUSCULAR | Status: DC
  Administered 2012-10-14: 01:00:00 via INTRAVENOUS

## 2012-10-13 MED ORDER — DIPHENHYDRAMINE HCL 50 MG/ML IJ SOLN
25.0000 mg | Freq: Once | INTRAMUSCULAR | Status: AC
  Administered 2012-10-13: 25 mg via INTRAVENOUS
  Filled 2012-10-13: qty 1

## 2012-10-13 NOTE — ED Notes (Signed)
Pt in from [facility], was sent here for eval. Pt has hx of Addison's Disease. Pt is also DM. Pt has been unable to keep fluids/food/ meds down x6 days. Pt is A&O and in NAD, Pt adds that she has been taking Zofran with no relief.

## 2012-10-13 NOTE — ED Notes (Signed)
Dr Radford Pax at bedside with ultrasound to obtain venous access

## 2012-10-13 NOTE — H&P (Signed)
[facility]                                                                                    Patient Demographics  Daisy [name], is a 61 y.o. female  CSN: [id number]  MRN: [id number]  DOB - 06-16-1951  Admit Date - 10/13/2012  Outpatient Primary MD for the patient is Daisy Floro, MD   With History of -  Past Medical History  Diagnosis Date  . Asthma   . Hypertension   . Shingles   . Sleep apnea   . OSA on CPAP   . Adenocarcinoma of breast 07/13/1992  . Hypothyroidism   . H/O esophageal spasm     "I can have as many as 67 TUMS before they stop"  . Radiation adverse effect     "100% blockage on left; 50% on right salivary; can drain w/massage"  . Diabetes mellitus     "diet controlled"  . Restless leg syndrome, controlled 02/19/12    "much better since being on CPAP; been on that ~ 1 month now"  . COPD (chronic obstructive pulmonary disease)   . Pneumonia     "I've had it several times"  . Chronic bronchitis   . Positive TB test     "ran deep in my father's family; went on INH for awhile; it caused migraines"  . Adrenal insufficiency     "w/secondary Addison's"  . Anemia   . GERD (gastroesophageal reflux disease)   . H/O hiatal hernia   . Upper GI bleeding 1970-1980    "had 4"  . Hepatitis B infection 1971    "in high school; after drinking after football player"  . Migraines     "much better now" (02/19/12)  . Headache     "when I don't eat" (02/19/12)  . Anxiety   . Arthritis   . Fibromyalgia   . Multiple sclerosis 05/1988    "dr's in [place] don't think I have it; dr's in [place] still do" (02/19/12)      Past Surgical History  Procedure Date  . Tonsillectomy and adenoidectomy 1964  . Mastectomy 2010    left  . Appendectomy 1960's  . Cholecystectomy 1980's  . Abdominal hysterectomy 1987  . Bilateral oophorectomy 1988  . Breast lumpectomy 07/13/1992    left  . Spinal cord stimulator insertion ~ 2004  . Back surgery ~ 2004   "chipped disk out to put in permanent stimulator"  . Band hemorrhoidectomy 1979  . Salivary gland surgery ~ 2002    "left was 100% blocked twice; right 50%; only had one surgery"    in for   Chief Complaint  Patient presents with  . Nausea  . Emesis  . Diarrhea  . Dizziness     HPI  Daisy [name]  is a 61 y.o. female, with past medical history significant for her Addison's disease secondary to adrenal insufficiency, presenting today with a few days history of worsening nausea and vomiting. The patient started having some nausea on the Monday and Zofran was ordered by her primary care physician Dr. Tenny Craw however it did not help the nausea and vomiting started getting worse she  started having diarrhea as well and 2 days ago started having periumbilical pain. Reports fever and chills for the last 2 days she had headache dizziness earlier today. Mild burning on urination. A central line was placed in the femoral area and the patient says that she's feeling a lot better already after receiving IV fluids. Cortisol level was drawn and pending.    Review of Systems    In addition to the HPI above, Had fever and chills for the last 2 days Had Headache, No changes with Vision or hearing, No problems swallowing food or Liquids, No Chest pain, Cough or Shortness of Breath, Reports periumbilical Abdominal pain with Nausea or Vommitting, and diarrhea No Blood in stool or Urine, Mild dysuria, No new skin rashes or bruises, No new joints pains-aches,  No new weakness, tingling, numbness in any extremity, No recent weight gain or loss, No polyuria, polydypsia or polyphagia, No significant Mental Stressors.  A full 10 point Review of Systems was done, except as stated above, all other Review of Systems were negative.   Social History History  Substance Use Topics  . Smoking status: Never Smoker   . Smokeless tobacco: Never Used  . Alcohol Use: No    Family History Patient father had  coronary artery disease, her mother died of a stroke  Prior to Admission medications   Medication Sig Start Date End Date Taking? Authorizing Provider  budesonide-formoterol (SYMBICORT) 160-4.5 MCG/ACT inhaler Inhale 2 puffs into the lungs 2 (two) times daily.      Historical Provider, MD  Calcium Carbonate-Vitamin D (CALTRATE 600+D PO) Take 1 tablet by mouth daily.     Historical Provider, MD  Cholecalciferol (VITAMIN D3) 2000 UNITS TABS Take 1 tablet by mouth daily.    Historical Provider, MD  clonazePAM (KLONOPIN) 1 MG tablet Take 1 mg by mouth 3 (three) times daily.      Historical Provider, MD  cyclobenzaprine (FLEXERIL) 10 MG tablet Take 5-10 mg by mouth 3 (three) times daily. Muscle spasms    Historical Provider, MD  Fe Fum-FePoly-Vit C-Vit B3 (INTEGRA) 62.5-62.5-40-3 MG CAPS Take 1 capsule by mouth daily. TAKE WITH 1 VITAMIN C TAB, 1 HOUR BEFORE MEAL OR 2 HOURS AFTER MEAL.      (IRON) 07/09/12   Lennis Buzzy Han, MD  fish oil-omega-3 fatty acids 1000 MG capsule Take 1 g by mouth daily.      Historical Provider, MD  furosemide (LASIX) 20 MG tablet Take 20 mg by mouth daily. 03/22/12- 20 mg x 2, per pt report 02/22/12 02/21/13  Sorin Luanne Bras, MD  Hydrocortisone (CORTIZONE-10 ECZEMA EX) Apply 1 application topically 2 (two) times daily as needed. For eczema     Historical Provider, MD  hydrOXYzine (ATARAX/VISTARIL) 50 MG tablet Take 50 mg by mouth 3 (three) times daily. For itching    Historical Provider, MD  levalbuterol (XOPENEX HFA) 45 MCG/ACT inhaler Inhale 2 puffs into the lungs every 4 (four) hours as needed. Shortness of breath.     Historical Provider, MD  levothyroxine (SYNTHROID, LEVOTHROID) 75 MCG tablet Take 75 mcg by mouth at bedtime.     Historical Provider, MD  losartan-hydrochlorothiazide (HYZAAR) 100-12.5 MG per tablet Take 1 tablet by mouth daily.      Historical Provider, MD  Magnesium Chloride (SLOW-MAG) 535 (64 MG) MG TBCR Take 1 tablet (64 mg total) by mouth 2 (two) times daily.  02/22/12   Sorin Luanne Bras, MD  morphine (MSIR) 30 MG tablet Take 60 mg by  mouth every 4 (four) hours as needed. Pain.    Historical Provider, MD  omeprazole (PRILOSEC) 40 MG capsule Take 40 mg by mouth at bedtime.    Historical Provider, MD  ondansetron (ZOFRAN) 4 MG tablet Take 8 mg by mouth every 8 (eight) hours as needed. For nausea    Historical Provider, MD  potassium chloride SA (K-DUR,KLOR-CON) 20 MEQ tablet Take 1 tablet (20 mEq total) by mouth 2 (two) times daily. 02/22/12 02/21/13  Sorin Luanne Bras, MD  predniSONE (DELTASONE) 5 MG tablet Take 5 mg by mouth daily.      Historical Provider, MD  riboflavin (VITAMIN B-2) 100 MG TABS Take 100 mg by mouth daily.      Historical Provider, MD  senna (SENOKOT) 8.6 MG TABS Take 1 tablet (8.6 mg total) by mouth 2 (two) times daily. 02/22/12   Sorin Luanne Bras, MD  Thiamine HCl (VITAMIN B-1) 250 MG tablet Take 250 mg by mouth daily.    Historical Provider, MD    Allergies  Allergen Reactions  . Amoxicillin-Pot Clavulanate Other (See Comments)    Bad headaches  . Compazine Other (See Comments)    Hyper and shaky  . Iohexol      Desc: ANAPHYLACTIC REACTION!-kmr-10/16/05   . Ivp Dye (Iodinated Diagnostic Agents) Anaphylaxis and Other (See Comments)    Fever and blood clot  . Lisinopril Cough  . Metformin And Related     Migraine   . Metoclopramide Hcl Other (See Comments)    Hyper, shaky  . Promethazine Hcl Other (See Comments)    Hyper,,,,shaky  . Sulfa Antibiotics Swelling    "alot in my abdomen"  . Terbinafine Hcl Other (See Comments)    Throat closed up.  . Vancomycin Swelling  . Guaifenesin Other (See Comments)    hyper  . Amlodipine Swelling  . Furosemide Other (See Comments)    Leg cramps    Physical Exam  Vitals  Blood pressure 160/79, pulse 83, temperature 97.9 F (36.6 C), temperature source Oral, resp. rate 22, SpO2 94.00%.   1. General white American female lying in bed drowsy.  2. Normal affect and insight, Not Suicidal  or Homicidal, Awake Alert, Oriented X 3.  3. No F.N deficits, ALL C.Nerves Intact, Strength 5/5 all 4 extremities, Sensation intact all 4 extremities, Plantars down going.  4. Ears and Eyes appear Normal, Conjunctivae clear, PERRLA. Moist Oral Mucosa.  5. Supple Neck, No JVD, No cervical lymphadenopathy appriciated, No Carotid Bruits.  6. Symmetrical Chest wall movement, Good air movement bilaterally, CTAB.  7. RRR, No Gallops, Rubs or Murmurs, No Parasternal Heave.  8. Positive Bowel Sounds, Abdomen Soft, Non tender, No organomegaly appriciated,No rebound -guarding or rigidity.  9.  No Cyanosis, decreased skin turgor, No Skin Rash or Bruise.  10. Good muscle tone,  joints appear normal , no effusions, Normal ROM.  11. No Palpable Lymph Nodes in Neck or Axillae    Data Review  CBC  Lab 10/13/12 1855  WBC 10.6*  HGB 15.7*  HCT 45.0  PLT 243  MCV 89.8  MCH 31.3  MCHC 34.9  RDW 13.0  LYMPHSABS 1.8  MONOABS 0.6  EOSABS 0.0  BASOSABS 0.0  BANDABS --   ------------------------------------------------------------------------------------------------------------------  Chemistries   Lab 10/13/12 2033  NA 136  K 4.2  CL 96  CO2 26  GLUCOSE 84  BUN 12  CREATININE 0.73  CALCIUM 9.3  MG --  AST 36  ALT 25  ALKPHOS 76  BILITOT 0.4   ------------------------------------------------------------------------------------------------------------------    ---------------------------------------------------------------------------------------------------------------  Urinalysis    Component Value Date/Time   COLORURINE YELLOW 10/13/2012 2017   APPEARANCEUR CLEAR 10/13/2012 2017   LABSPEC 1.010 10/13/2012 2017   PHURINE 7.0 10/13/2012 2017   GLUCOSEU NEGATIVE 10/13/2012 2017   HGBUR NEGATIVE 10/13/2012 2017   BILIRUBINUR NEGATIVE 10/13/2012 2017   KETONESUR 40* 10/13/2012 2017   PROTEINUR NEGATIVE 10/13/2012 2017   UROBILINOGEN 0.2 10/13/2012 2017   NITRITE  NEGATIVE 10/13/2012 2017   LEUKOCYTESUR NEGATIVE 10/13/2012 2017    ----------------------------------------------------------------------------------------------------------------  ABG     Imaging results:   No results found.  My personal review of EKG: Normal sinus rhythm rate of 80 , no acute changes     Assessment & Plan     1.  nausea vomiting and diarrhea the patient with Addison's disease, rule out addisonian crisis. 2. history of Addison's disease 3. history of asthma, hypertension, shingles breast cancer diagnosed in 04/12/1992, hypothyroidism, obstructive sleep apnea questionable history of multiple sclerosis,fibromyalgia ,migraines  Plan IV fluids started now IV steroids IV Synthroid   DVT Prophylaxis Heparin   AM Labs Ordered, also please review Full Orders  Family Communication: Admission, patients condition and plan of care including tests being ordered have been discussed with the patient  who indicate understanding and agree with the plan and Code Status.  Code Status full  Disposition Plan: Home  Time spent in minutes : 45  Condition GUARDED

## 2012-10-13 NOTE — ED Provider Notes (Signed)
History     CSN: [id number]  Arrival date & time 10/13/12  1734   First MD Initiated Contact with Patient 10/13/12 1805      Chief Complaint  Patient presents with  . Nausea  . Emesis  . Diarrhea  . Dizziness     HPI Pt in from [place], was sent here for eval. Pt has hx of Addison's Disease. Pt is also DM. Pt has been unable to keep fluids/food/ meds down x6 days. Pt is A&O and in NAD, Pt adds that she has been taking Zofran with no relief  Past Medical History  Diagnosis Date  . Asthma   . Hypertension   . Shingles   . Sleep apnea   . OSA on CPAP   . Adenocarcinoma of breast 07/13/1992  . Hypothyroidism   . H/O esophageal spasm     "I can have as many as 58 TUMS before they stop"  . Radiation adverse effect     "100% blockage on left; 50% on right salivary; can drain w/massage"  . Diabetes mellitus     "diet controlled"  . Restless leg syndrome, controlled 02/19/12    "much better since being on CPAP; been on that ~ 1 month now"  . COPD (chronic obstructive pulmonary disease)   . Pneumonia     "I've had it several times"  . Chronic bronchitis   . Positive TB test     "ran deep in my father's family; went on INH for awhile; it caused migraines"  . Adrenal insufficiency     "w/secondary Addison's"  . Anemia   . GERD (gastroesophageal reflux disease)   . H/O hiatal hernia   . Upper GI bleeding 1970-1980    "had 4"  . Hepatitis B infection 1971    "in high school; after drinking after football player"  . Migraines     "much better now" (02/19/12)  . Headache     "when I don't eat" (02/19/12)  . Anxiety   . Arthritis   . Fibromyalgia   . Multiple sclerosis 05/1988    "dr's in [place] don't think I have it; dr's in [place] still do" (02/19/12)    Past Surgical History  Procedure Date  . Tonsillectomy and adenoidectomy 1964  . Mastectomy 2010    left  . Appendectomy 1960's  . Cholecystectomy 1980's  . Abdominal hysterectomy 1987  . Bilateral oophorectomy  1988  . Breast lumpectomy 07/13/1992    left  . Spinal cord stimulator insertion ~ 2004  . Back surgery ~ 2004    "chipped disk out to put in permanent stimulator"  . Band hemorrhoidectomy 1979  . Salivary gland surgery ~ 2002    "left was 100% blocked twice; right 50%; only had one surgery"    No family history on file.  History  Substance Use Topics  . Smoking status: Never Smoker   . Smokeless tobacco: Never Used  . Alcohol Use: No    OB History    Grav Para Term Preterm Abortions TAB SAB Ect Mult Living                  Review of Systems All other systems reviewed and are negative Allergies  Amoxicillin-pot clavulanate; Compazine; Iohexol; Ivp dye; Lisinopril; Metformin and related; Metoclopramide hcl; Promethazine hcl; Sulfa antibiotics; Terbinafine hcl; Vancomycin; Guaifenesin; Amlodipine; and Furosemide  Home Medications   Current Outpatient Rx  Name  Route  Sig  Dispense  Refill  . BUDESONIDE-FORMOTEROL FUMARATE 160-4.5  MCG/ACT IN AERO   Inhalation   Inhale 2 puffs into the lungs 2 (two) times daily.           Marland Kitchen CALTRATE 600+D PO   Oral   Take 1 tablet by mouth daily.          Marland Kitchen VITAMIN D3 2000 UNITS PO TABS   Oral   Take 1 tablet by mouth daily.         Marland Kitchen CLONAZEPAM 1 MG PO TABS   Oral   Take 1 mg by mouth 3 (three) times daily.           . CYCLOBENZAPRINE HCL 10 MG PO TABS   Oral   Take 5-10 mg by mouth 3 (three) times daily. Muscle spasms         . INTEGRA 62.5-62.5-40-3 MG PO CAPS   Oral   Take 1 capsule by mouth daily. TAKE WITH 1 VITAMIN C TAB, 1 HOUR BEFORE MEAL OR 2 HOURS AFTER MEAL.      (IRON)         . OMEGA-3 FATTY ACIDS 1000 MG PO CAPS   Oral   Take 1 g by mouth daily.           . FUROSEMIDE 20 MG PO TABS   Oral   Take 20 mg by mouth daily.         Lowanda Foster ECZEMA EX   Apply externally   Apply 1 application topically 2 (two) times daily as needed. For eczema          . LEVALBUTEROL TARTRATE 45 MCG/ACT  IN AERO   Inhalation   Inhale 2 puffs into the lungs every 4 (four) hours as needed. Shortness of breath.          Marland Kitchen LEVOTHYROXINE SODIUM 75 MCG PO TABS   Oral   Take 75 mcg by mouth at bedtime.          Marland Kitchen LOSARTAN POTASSIUM-HCTZ 100-12.5 MG PO TABS   Oral   Take 1 tablet by mouth daily.           Marland Kitchen MAGNESIUM CHLORIDE ER 535 (64 MG) MG PO TBCR   Oral   Take 1 tablet (64 mg total) by mouth 2 (two) times daily.   60 tablet   0   . MORPHINE SULFATE 30 MG PO TABS   Oral   Take 60 mg by mouth every 4 (four) hours as needed. Pain.         Marland Kitchen OMEPRAZOLE 40 MG PO CPDR   Oral   Take 40 mg by mouth at bedtime.         Marland Kitchen POTASSIUM CHLORIDE CRYS ER 20 MEQ PO TBCR   Oral   Take 1 tablet (20 mEq total) by mouth 2 (two) times daily.   60 tablet   0   . PREDNISONE 5 MG PO TABS   Oral   Take 5 mg by mouth daily.           Marland Kitchen VITAMIN B-2 100 MG PO TABS   Oral   Take 100 mg by mouth daily.           . SENNA 8.6 MG PO TABS   Oral   Take 1 tablet (8.6 mg total) by mouth 2 (two) times daily.   120 each      . VITAMIN B-1 250 MG PO TABS   Oral   Take 250 mg by mouth daily.         Marland Kitchen  HYDROXYZINE HCL 50 MG PO TABS   Oral   Take 50 mg by mouth 3 (three) times daily. For itching         . ONDANSETRON HCL 4 MG PO TABS   Oral   Take 8 mg by mouth every 8 (eight) hours as needed. For nausea           BP 152/95  Pulse 91  Temp 98.6 F (37 C) (Oral)  Resp 21  SpO2 95%  Physical Exam  Nursing note and vitals reviewed. Constitutional: She is oriented to person, place, and time. She appears well-developed and well-nourished.  Non-toxic appearance. She appears ill.  HENT:  Head: Normocephalic and atraumatic.  Eyes: Pupils are equal, round, and reactive to light.  Neck: Normal range of motion.  Cardiovascular: Normal rate and intact distal pulses.   Pulmonary/Chest: No respiratory distress.  Abdominal: Normal appearance. She exhibits no distension. There is no  tenderness. There is no rebound.  Musculoskeletal: Normal range of motion.  Neurological: She is alert and oriented to person, place, and time. No cranial nerve deficit.  Skin: Skin is warm and dry. No rash noted. No erythema. There is pallor.  Psychiatric: She has a normal mood and affect. Her behavior is normal.    ED Course  CENTRAL LINE Performed by: Nelia Shi Authorized by: Nelia Shi Consent: Verbal consent obtained. Risks and benefits: risks, benefits and alternatives were discussed Consent given by: patient Patient understanding: patient states understanding of the procedure being performed Patient identity confirmed: verbally with patient Indications: vascular access Patient sedated: no Preparation: skin prepped with 2% chlorhexidine Skin prep agent dried: skin prep agent completely dried prior to procedure Hand hygiene: hand hygiene performed prior to central venous catheter insertion Location details: right femoral Catheter size: 14 Fr Ultrasound guidance: yes Number of attempts: 1 Successful placement: yes Post-procedure: dressing applied Assessment: blood return through all parts and free fluid flow Patient tolerance: Patient tolerated the procedure well with no immediate complications.   (including critical care time)   CRITICAL CARE Performed by: Nelva Nay L   Total critical care time: 30 min  Critical care time was exclusive of separately billable procedures and treating other patients.  Critical care was necessary to treat or prevent imminent or life-threatening deterioration.  Critical care was time spent personally by me on the following activities: development of treatment plan with patient and/or surrogate as well as nursing, discussions with consultants, evaluation of patient's response to treatment, examination of patient, obtaining history from patient or surrogate, ordering and performing treatments and interventions, ordering and  review of laboratory studies, ordering and review of radiographic studies, pulse oximetry and re-evaluation of patient's condition.  Labs Reviewed  CBC WITH DIFFERENTIAL - Abnormal; Notable for the following:    WBC 10.6 (*)     Hemoglobin 15.7 (*)     Neutro Abs 8.2 (*)     All other components within normal limits  URINALYSIS, MICROSCOPIC ONLY - Abnormal; Notable for the following:    Ketones, ur 40 (*)     All other components within normal limits  LACTIC ACID, PLASMA  COMPREHENSIVE METABOLIC PANEL  CORTISOL  URINALYSIS, ROUTINE W REFLEX MICROSCOPIC  URINE CULTURE   Dg Chest Portable 1 View  10/13/2012  *RADIOLOGY REPORT*  Clinical Data: 61 year old female nausea vomiting diarrhea.  PORTABLE CHEST - 1 VIEW  Comparison: 08/14/2012 and earlier.  Findings: Portable AP view 2113 hours.  Postoperative changes again noted to the left axilla, cervical  ACDF hardware, and thoracic spinal stimulator devices.  Stable cardiac size and mediastinal contours.   No pneumothorax or pneumoperitoneum.  No pulmonary edema, pleural effusion or acute pulmonary opacity.  IMPRESSION: No acute cardiopulmonary abnormality.   Original Report Authenticated By: Erskine Speed, M.D.      1. Addisonian crisis   2. Abdominal pain   3. Nausea & vomiting   4. Thyroid disease       MDM   Case discussed with critical care who referred patient to hospitalist.      Nelia Shi, MD 10/13/12 [phone]

## 2012-10-14 DIAGNOSIS — R109 Unspecified abdominal pain: Secondary | ICD-10-CM

## 2012-10-14 DIAGNOSIS — R112 Nausea with vomiting, unspecified: Secondary | ICD-10-CM

## 2012-10-14 DIAGNOSIS — I10 Essential (primary) hypertension: Secondary | ICD-10-CM

## 2012-10-14 DIAGNOSIS — E2749 Other adrenocortical insufficiency: Principal | ICD-10-CM

## 2012-10-14 LAB — BASIC METABOLIC PANEL
BUN: 13 mg/dL (ref 6–23)
Calcium: 9 mg/dL (ref 8.4–10.5)
GFR calc Af Amer: >90 mL/min (ref >90)
GFR calc non Af Amer: >90 mL/min (ref >90)
Potassium: 3.9 mEq/L (ref 3.5–5.1)
Sodium: 137 mEq/L (ref 135–145)

## 2012-10-14 LAB — GLUCOSE, CAPILLARY
Glucose-Capillary: 130 mg/dL — ABNORMAL HIGH (ref 70–99)
Glucose-Capillary: 79 mg/dL (ref 70–99)

## 2012-10-14 LAB — URINALYSIS, ROUTINE W REFLEX MICROSCOPIC
Glucose, UA: NEGATIVE mg/dL
Ketones, ur: 40 mg/dL — AB
Leukocytes, UA: NEGATIVE
Protein, ur: NEGATIVE mg/dL
Urobilinogen, UA: 0.2 mg/dL (ref 0.0–1.0)

## 2012-10-14 LAB — MAGNESIUM: Magnesium: 1.8 mg/dL (ref 1.5–2.5)

## 2012-10-14 MED ORDER — LEVOTHYROXINE SODIUM 100 MCG IV SOLR
40.0000 ug | Freq: Every day | INTRAVENOUS | Status: DC
  Administered 2012-10-14: 40 ug via INTRAVENOUS
  Filled 2012-10-14 (×3): qty 2

## 2012-10-14 MED ORDER — LEVOTHYROXINE SODIUM 75 MCG PO TABS
75.0000 ug | ORAL_TABLET | Freq: Every day | ORAL | Status: DC
  Administered 2012-10-15: 75 ug via ORAL
  Filled 2012-10-14 (×2): qty 1

## 2012-10-14 MED ORDER — ACETAMINOPHEN 325 MG PO TABS: 650.0000 mg | ORAL_TABLET | Freq: Four times a day (QID) | ORAL | Status: DC | PRN

## 2012-10-14 MED ORDER — OXYCODONE-ACETAMINOPHEN 5-325 MG PO TABS
1.0000 | ORAL_TABLET | Freq: Four times a day (QID) | ORAL | Status: DC | PRN
  Administered 2012-10-14: 1 via ORAL
  Filled 2012-10-14: qty 1

## 2012-10-14 MED ORDER — LORAZEPAM 1 MG PO TABS
1.0000 mg | ORAL_TABLET | Freq: Four times a day (QID) | ORAL | Status: DC | PRN
  Administered 2012-10-14: 1 mg via ORAL
  Filled 2012-10-14: qty 1

## 2012-10-14 MED ORDER — INSULIN ASPART 100 UNIT/ML ~~LOC~~ SOLN
0.0000 [IU] | Freq: Three times a day (TID) | SUBCUTANEOUS | Status: DC
  Administered 2012-10-14: 2 [IU] via SUBCUTANEOUS
  Administered 2012-10-15 – 2012-10-16 (×3): 1 [IU] via SUBCUTANEOUS
  Administered 2012-10-16: 2 [IU] via SUBCUTANEOUS
  Administered 2012-10-17: 1 [IU] via SUBCUTANEOUS

## 2012-10-14 MED ORDER — PREDNISONE 5 MG PO TABS
15.0000 mg | ORAL_TABLET | Freq: Every day | ORAL | Status: DC
  Administered 2012-10-14 – 2012-10-17 (×4): 15 mg via ORAL
  Filled 2012-10-14 (×6): qty 3

## 2012-10-14 MED ORDER — ASPIRIN EC 325 MG PO TBEC
650.0000 mg | DELAYED_RELEASE_TABLET | Freq: Four times a day (QID) | ORAL | Status: DC | PRN
  Administered 2012-10-14 – 2012-10-16 (×2): 650 mg via ORAL
  Filled 2012-10-14 (×2): qty 2

## 2012-10-14 MED ORDER — MORPHINE SULFATE 30 MG PO TABS
60.0000 mg | ORAL_TABLET | ORAL | Status: DC | PRN
  Administered 2012-10-14 – 2012-10-18 (×15): 60 mg via ORAL
  Filled 2012-10-14 (×15): qty 2

## 2012-10-14 MED ORDER — CLONAZEPAM 1 MG PO TABS
1.0000 mg | ORAL_TABLET | Freq: Three times a day (TID) | ORAL | Status: DC
  Administered 2012-10-14 – 2012-10-18 (×13): 1 mg via ORAL
  Filled 2012-10-14 (×13): qty 1

## 2012-10-14 MED ORDER — LORAZEPAM 1 MG PO TABS
1.0000 mg | ORAL_TABLET | Freq: Four times a day (QID) | ORAL | Status: DC | PRN
  Administered 2012-10-14 – 2012-10-16 (×3): 1 mg via ORAL
  Filled 2012-10-14 (×4): qty 1

## 2012-10-14 MED ORDER — HYDROXYZINE HCL 50 MG PO TABS
50.0000 mg | ORAL_TABLET | Freq: Three times a day (TID) | ORAL | Status: DC
  Administered 2012-10-14 – 2012-10-18 (×13): 50 mg via ORAL
  Filled 2012-10-14 (×16): qty 1

## 2012-10-14 MED ORDER — ASPIRIN 325 MG PO TABS
650.0000 mg | ORAL_TABLET | Freq: Once | ORAL | Status: AC
  Administered 2012-10-14: 650 mg via ORAL
  Filled 2012-10-14: qty 2

## 2012-10-14 MED ORDER — LORAZEPAM 2 MG/ML IJ SOLN: 1.0000 mg | INTRAMUSCULAR | Status: DC | PRN

## 2012-10-14 NOTE — Progress Notes (Signed)
Pt was having frequent runs of bigeminy and trigeminy. Patient was on bedside and asymptomatic. NP, K. Schorr was notified and ordered a magnesium level. RN will continue to monitor HR and rhythm.

## 2012-10-14 NOTE — Progress Notes (Signed)
[facility] PROGRESS NOTE  Daisy [name] XBJ:[id number] DOB: [birth year] DOA: 10/13/2012 PCP: Daisy Floro, MD  Assessment/Plan:  Nausea, vomiting, diarrhea.  Liver function tests and lipase within normal limits.  Urinalysis was notable for ketones.  Symptoms improving.  No diarrhea since admission -  F/u urine culture -  Cortisol level normal-high -  Continue solucortef pending cortisol level (day 1)  Tachycardia:  May be related to acute illness, steroids, SIRS, benzo withdrawal as patient is on klonopin at home and has not taken in several days.   -  Restart klonopin -  CIWA -  Ativan prn -  Continue telemetry -  TSH pending -  Continue IVF (were off most of the night)  Headache, migrainous with typical photo and phonophobia -  Aspirin per patient request  Diabetes, diet controlled:  Fingersticks rising slightly -  SSI (starting diet)  HTN:  Patient now with elevated BP  -  Monitor for now -   Hold BP medications   Breathing stable.  Continue symbicort  DIET:  NPO ACCESS:  Central line right groin >> attempt for PICC IVF:  NS at 177ml/h PROPH:  heparin  Code Status: full code Family Communication: With patient  Disposition Plan: Pending improvement in nausea, vomiting, tolerating PO, on oral medications.     Consultants:  None  Procedures:  Femoral central line 11/10  Antibiotics:  None  HPI/Subjective:  Headache with photo and phonophobia.  States nausea and vomiting and diarrhea are improving.  Denies pain and difficulty breathing.    Objective: Filed Vitals:   10/14/12 0110 10/14/12 0115 10/14/12 0539 10/14/12 0751  BP: 143/77 143/91 147/91   Pulse: 90 92 100   Temp:   98.2 F (36.8 C)   TempSrc:   Oral   Resp:      Height:      Weight:      SpO2:   93% 98%    Intake/Output Summary (Last 24 hours) at 10/14/12 1018 Last data filed at 10/14/12 3086  Gross per 24 hour  Intake 581.67 ml  Output    400 ml  Net 181.67 ml    Filed Weights   10/14/12 0100  Weight: 87.5 kg (192 lb 14.4 oz)    Exam:   General:  Caucasian female, no acute distress, lying in bed  HEENT:  MMM  Cardiovascular: Tachcardic, regular rhythm, no murmurs, rubs, or gallops  Respiratory: CTAB  Abdomen: NABS, soft, nondistended, nontender, no organomegaly  MSK:  No LEE  NEuro:  Tongue fasciculations and hand tremor present  Data Reviewed: Basic Metabolic Panel:  Lab 10/14/12 5784 10/13/12 2033  NA 137 136  K 3.9 4.2  CL 100 96  CO2 23 26  GLUCOSE 138* 84  BUN 13 12  CREATININE 0.65 0.73  CALCIUM 9.0 9.3  MG -- --  PHOS -- --   Liver Function Tests:  Lab 10/13/12 2033  AST 36  ALT 25  ALKPHOS 76  BILITOT 0.4  PROT 7.5  ALBUMIN 3.5   No results found for this basename: LIPASE:5,AMYLASE:5 in the last 168 hours No results found for this basename: AMMONIA:5 in the last 168 hours CBC:  Lab 10/13/12 1855  WBC 10.6*  NEUTROABS 8.2*  HGB 15.7*  HCT 45.0  MCV 89.8  PLT 243   Cardiac Enzymes: No results found for this basename: CKTOTAL:5,CKMB:5,CKMBINDEX:5,TROPONINI:5 in the last 168 hours BNP (last 3 results) No results found for this basename: PROBNP:3 in the last 8760 hours CBG: No  results found for this basename: GLUCAP:5 in the last 168 hours  No results found for this or any previous visit (from the past 240 hour(s)).   Studies: Dg Chest Portable 1 View  10/13/2012  *RADIOLOGY REPORT*  Clinical Data: 61 year old female nausea vomiting diarrhea.  PORTABLE CHEST - 1 VIEW  Comparison: 08/14/2012 and earlier.  Findings: Portable AP view 2113 hours.  Postoperative changes again noted to the left axilla, cervical ACDF hardware, and thoracic spinal stimulator devices.  Stable cardiac size and mediastinal contours.   No pneumothorax or pneumoperitoneum.  No pulmonary edema, pleural effusion or acute pulmonary opacity.  IMPRESSION: No acute cardiopulmonary abnormality.   Original Report Authenticated By: Erskine Speed, M.D.     Scheduled Meds:   . budesonide-formoterol  2 puff Inhalation BID  . [COMPLETED] dexamethasone  10 mg Intravenous Once  . [COMPLETED] diphenhydrAMINE  12.5 mg Intravenous Once  . [COMPLETED] diphenhydrAMINE  25 mg Intravenous Once  . heparin  5,000 Units Subcutaneous Q8H  . hydrocortisone sod succinate (SOLU-CORTEF) injection  100 mg Intravenous Q8H  . levothyroxine  40 mcg Intravenous QAC breakfast  . [COMPLETED] promethazine  12.5 mg Intravenous Once  . [COMPLETED] promethazine  12.5 mg Intravenous Once  . [COMPLETED] sodium chloride  1,000 mL Intravenous Once  . sodium chloride  3 mL Intravenous Q12H  . [DISCONTINUED] levothyroxine (SYNTHROID) NICU IV syringe 20 mcg/mL  40 mcg Intravenous Q24H   Continuous Infusions:   . sodium chloride 100 mL/hr at 10/14/12 1610    Principal Problem:  *Nausea & vomiting    Time spent: 30     Shanterria Franta, [facility]  [facility] Pager [phone]. If 8PM-8AM, please contact night-coverage at [email or web address], password [facility] 10/14/2012, 10:18 AM  LOS: 1 day

## 2012-10-14 NOTE — Progress Notes (Signed)
Nutrition Brief Note  Patient identified on the Malnutrition Screening Tool (MST) Report  Body mass index is 33.64 kg/(m^2). Pt meets criteria for obesity grade 1 based on current BMI.   Current diet order is clear liquid, patient is consuming approximately 10% of meals at this time. Labs and medications reviewed.   Pt reports eating well prior to admit with N/V/D.  Tolerating small amounts of clear liquids. No nutrition interventions warranted at this time. If nutrition issues arise, please consult RD.   Oran Rein, RD, LDN Clinical Inpatient Dietitian Pager:  [phone] Weekend and after hours pager:  [phone]

## 2012-10-15 DIAGNOSIS — E079 Disorder of thyroid, unspecified: Secondary | ICD-10-CM

## 2012-10-15 LAB — GLUCOSE, CAPILLARY
Glucose-Capillary: 153 mg/dL — ABNORMAL HIGH (ref 70–99)
Glucose-Capillary: 128 mg/dL — ABNORMAL HIGH (ref 70–99)

## 2012-10-15 LAB — URINE CULTURE: Colony Count: NO GROWTH

## 2012-10-15 LAB — TSH: TSH: 0.275 u[IU]/mL — ABNORMAL LOW (ref 0.350–4.500)

## 2012-10-15 MED ORDER — LEVOTHYROXINE SODIUM 50 MCG PO TABS
50.0000 ug | ORAL_TABLET | Freq: Every day | ORAL | Status: DC
  Administered 2012-10-16 – 2012-10-18 (×3): 50 ug via ORAL
  Filled 2012-10-15 (×4): qty 1

## 2012-10-15 MED ORDER — MAGNESIUM SULFATE 40 MG/ML IJ SOLN
2.0000 g | Freq: Once | INTRAMUSCULAR | Status: AC
  Administered 2012-10-15: 2 g via INTRAVENOUS
  Filled 2012-10-15: qty 50

## 2012-10-15 NOTE — Progress Notes (Signed)
Patient complaining of a sudden burning sensation at the femoral IV site. RN assessed site and did not note any redness or swelling. RN put a warm cloth on site and patient said it helped it feel better. Pt was receiving magnesium sulfate through her IV but the NS fluids just now switched back over. RN will continue to monitor site and patient for discomfort.

## 2012-10-15 NOTE — Progress Notes (Addendum)
[facility] PROGRESS NOTE  Daisy [name] WUJ:[id number] DOB: 02-12-1951 DOA: 10/13/2012 PCP: Daisy Floro, MD  Assessment/Plan:  Nausea, vomiting, diarrhea.  Liver function tests and lipase within normal limits.  Urinalysis was notable for ketones.  Symptoms improving.  No diarrhea since admission -  urine culture negative -  Cortisol level normal-high -  Not tolerating diet  Rash, patient states that she has had similar rash intermittently since she was diagnosed with Addison's disease, however, it is more painful today.    Tachycardia:  May be related to hyperthyroidism, acute illness, steroids, SIRS, benzo withdrawal as patient is on klonopin at home and has not taken in several days.  Also, she states that she has been becoming hyperthyroid recently per Dr. Rosalita Chessman.   -  Continue klonopin with ativan PRN -  Continue telemetry -  TSH low - will decrease synthroid to for now.  Please discuss with Dr. Rosalita Chessman.   -  Left message for Dr. Rosalita Chessman to call me  Headache, migrainous with typical photo and phonophobia, resolved with aspirin.    Diabetes, diet controlled:  Fingersticks stable -  SSI (starting diet)  HTN: blood pressure stable while holding home blood pressure medications.   -  Monitor for now -   Hold BP medications   Breathing stable.  Continue symbicort  DIET:  NPO ACCESS:  DC central line groin (leaking) IVF:  OFF PROPH:  heparin  Code Status: full code Family Communication: With patient  Disposition Plan: Pending improvement in nausea, vomiting, tolerating PO, on oral medications.     Consultants:  None  Procedures:  Femoral central line 11/10  Antibiotics:  None  HPI/Subjective:  Headache resolved.  Persistent nausea without vomiting.  Denies diarrhea, constipation.  Drinking sips, but not eating.      Objective: Filed Vitals:   10/14/12 1414 10/14/12 2118 10/14/12 2309 10/15/12 0515  BP: 163/92 152/85  117/54  Pulse: 109 75   87  Temp:  98.2 F (36.8 C)  98.1 F (36.7 C)  TempSrc:  Oral  Oral  Resp:  20  18  Height:      Weight:      SpO2:  100% 99% 97%    Intake/Output Summary (Last 24 hours) at 10/15/12 1136 Last data filed at 10/15/12 0800  Gross per 24 hour  Intake 2933.34 ml  Output   1010 ml  Net 1923.34 ml   Filed Weights   10/14/12 0100  Weight: 87.5 kg (192 lb 14.4 oz)    Exam:   General:  Caucasian female, no acute distress, lying in bed  HEENT:  MMM  Cardiovascular: Tachcardic, regular rhythm, no murmurs, rubs, or gallops  Respiratory: CTAB  Abdomen: NABS, soft, nondistended, nontender, no organomegaly  MSK:  No LEE  NEuro:  Patient appears anxious and is jittery in bed.  Tremor and tongue fasciculations still present  Data Reviewed: Basic Metabolic Panel:  Lab 10/14/12 9562 10/14/12 0440 10/13/12 2033  NA -- 137 136  K -- 3.9 4.2  CL -- 100 96  CO2 -- 23 26  GLUCOSE -- 138* 84  BUN -- 13 12  CREATININE -- 0.65 0.73  CALCIUM -- 9.0 9.3  MG 1.8 -- --  PHOS -- -- --   Liver Function Tests:  Lab 10/13/12 2033  AST 36  ALT 25  ALKPHOS 76  BILITOT 0.4  PROT 7.5  ALBUMIN 3.5   No results found for this basename: LIPASE:5,AMYLASE:5 in the last 168 hours No  results found for this basename: AMMONIA:5 in the last 168 hours CBC:  Lab 10/13/12 1855  WBC 10.6*  NEUTROABS 8.2*  HGB 15.7*  HCT 45.0  MCV 89.8  PLT 243   Cardiac Enzymes: No results found for this basename: CKTOTAL:5,CKMB:5,CKMBINDEX:5,TROPONINI:5 in the last 168 hours BNP (last 3 results) No results found for this basename: PROBNP:3 in the last 8760 hours CBG:  Lab 10/15/12 1130 10/15/12 0756 10/14/12 2116 10/14/12 1714 10/14/12 1206  GLUCAP 153* 89 79 130* 136*    Recent Results (from the past 240 hour(s))  URINE CULTURE     Status: Normal   Collection Time   10/13/12  8:17 PM      Component Value Range Status Comment   Specimen Description URINE, CATHETERIZED   Final    Special  Requests NONE   Final    Culture  Setup Time 10/14/2012 09:13   Final    Colony Count NO GROWTH   Final    Culture NO GROWTH   Final    Report Status 10/15/2012 FINAL   Final      Studies: Dg Chest Portable 1 View  10/13/2012  *RADIOLOGY REPORT*  Clinical Data: 61 year old female nausea vomiting diarrhea.  PORTABLE CHEST - 1 VIEW  Comparison: 08/14/2012 and earlier.  Findings: Portable AP view 2113 hours.  Postoperative changes again noted to the left axilla, cervical ACDF hardware, and thoracic spinal stimulator devices.  Stable cardiac size and mediastinal contours.   No pneumothorax or pneumoperitoneum.  No pulmonary edema, pleural effusion or acute pulmonary opacity.  IMPRESSION: No acute cardiopulmonary abnormality.   Original Report Authenticated By: Erskine Speed, M.D.     Scheduled Meds:    . [COMPLETED] aspirin  650 mg Oral Once  . budesonide-formoterol  2 puff Inhalation BID  . clonazePAM  1 mg Oral TID  . heparin  5,000 Units Subcutaneous Q8H  . hydrOXYzine  50 mg Oral TID  . insulin aspart  0-9 Units Subcutaneous TID WC  . levothyroxine  75 mcg Oral QAC breakfast  . [COMPLETED] magnesium sulfate 1 - 4 g bolus IVPB  2 g Intravenous Once  . predniSONE  15 mg Oral Q breakfast   Continuous Infusions:    . sodium chloride 100 mL/hr at 10/15/12 1610    Principal Problem:  *Nausea & vomiting    Time spent: 30     Burley Kopka, [facility]  [facility] Pager [phone]. If 8PM-8AM, please contact night-coverage at [email or web address], password [facility] 10/15/2012, 11:36 AM  LOS: 2 days

## 2012-10-16 LAB — CBC
HCT: 39.7 % (ref 36.0–46.0)
MCH: 30.5 pg (ref 26.0–34.0)
MCV: 92.3 fL (ref 78.0–100.0)
RBC: 4.3 MIL/uL (ref 3.87–5.11)
RDW: 13.8 % (ref 11.5–15.5)
WBC: 8.1 10*3/uL (ref 4.0–10.5)

## 2012-10-16 LAB — BASIC METABOLIC PANEL
BUN: 15 mg/dL (ref 6–23)
CO2: 24 mEq/L (ref 19–32)
Calcium: 8.3 mg/dL — ABNORMAL LOW (ref 8.4–10.5)
Chloride: 104 mEq/L (ref 96–112)
Creatinine, Ser: 0.79 mg/dL (ref 0.50–1.10)

## 2012-10-16 LAB — GLUCOSE, CAPILLARY

## 2012-10-16 NOTE — Progress Notes (Signed)
[facility] PROGRESS NOTE  Daisy [name] UJW:[id number] DOB: [birth year] DOA: 10/13/2012 PCP: Daisy Floro, MD  Assessment/Plan:  Nausea, vomiting, diarrhea.  Liver function tests and lipase within normal limits.  Urinalysis was notable for ketones.  Symptoms improving.  No diarrhea since admission -  urine culture negative -  Cortisol level normal-high -  Still working on her diet. Was able to eat little for breakfast.   Rash, patient states that she has had similar rash intermittently since she was diagnosed with Addison's disease, however, it is more painful today.    Tachycardia:  Resolved.  - May be related to hyperthyroidism, acute illness, steroids, SIRS, benzo withdrawal as patient is on klonopin at home and has not taken in several days.  Also, she states that she has been becoming hyperthyroid recently per Dr. Rosalita Chessman.   -  Continue klonopin with ativan PRN -  Continue telemetry -  TSH low - will decrease synthroid to for now.  Free t4 levels ordered.  -  Left message for Dr. Rosalita Chessman .   Headache, migrainous with typical photo and phonophobia, resolved with aspirin.    Diabetes, diet controlled:  Fingersticks stable -  SSI (starting diet)  HTN: blood pressure stable while holding home blood pressure medications.   -  Monitor for now -   Hold BP medications   Breathing stable.  Continue symbicort  DIET:  Carb modified diet.  ACCESS:  DC ed central line groin (leaking) IVF:  OFF PROPH:  heparin  Code Status: full code Family Communication: With patient , none at  Bedside.  Disposition Plan: Pending improvement in nausea, vomiting, tolerating PO, on oral medications.     Consultants:  None  Procedures:  Femoral central line 11/10  Antibiotics:  None  HPI/Subjective:  Headache resolved.Denies diarrhea, constipation. Slowly staarted eating.  Objective: Filed Vitals:   10/15/12 2129 10/15/12 2358 10/16/12 0555 10/16/12 0904  BP: 152/69   125/64   Pulse: 83 82 76   Temp: 98.5 F (36.9 C)  97.9 F (36.6 C)   TempSrc: Oral  Oral   Resp: 18 18 18    Height:      Weight:      SpO2: 98% 98% 98% 98%    Intake/Output Summary (Last 24 hours) at 10/16/12 0916 Last data filed at 10/16/12 0700  Gross per 24 hour  Intake   1060 ml  Output    700 ml  Net    360 ml   Filed Weights   10/14/12 0100  Weight: 87.5 kg (192 lb 14.4 oz)    Exam:   General:  Caucasian female, no acute distress, lying in bed  HEENT:  MMM  Cardiovascular: Tachcardic, regular rhythm, no murmurs, rubs, or gallops  Respiratory: CTAB  Abdomen: NABS, soft, nondistended, nontender, no organomegaly  MSK:  No LEE  NEuro:  Patient appears  Slightly anxious  Tremor and tongue fasciculations still present  Data Reviewed: Basic Metabolic Panel:  Lab 10/14/12 5621 10/14/12 0440 10/13/12 2033  NA -- 137 136  K -- 3.9 4.2  CL -- 100 96  CO2 -- 23 26  GLUCOSE -- 138* 84  BUN -- 13 12  CREATININE -- 0.65 0.73  CALCIUM -- 9.0 9.3  MG 1.8 -- --  PHOS -- -- --   Liver Function Tests:  Lab 10/13/12 2033  AST 36  ALT 25  ALKPHOS 76  BILITOT 0.4  PROT 7.5  ALBUMIN 3.5   No results found for this  basename: LIPASE:5,AMYLASE:5 in the last 168 hours No results found for this basename: AMMONIA:5 in the last 168 hours CBC:  Lab 10/13/12 1855  WBC 10.6*  NEUTROABS 8.2*  HGB 15.7*  HCT 45.0  MCV 89.8  PLT 243   Cardiac Enzymes: No results found for this basename: CKTOTAL:5,CKMB:5,CKMBINDEX:5,TROPONINI:5 in the last 168 hours BNP (last 3 results) No results found for this basename: PROBNP:3 in the last 8760 hours CBG:  Lab 10/16/12 0723 10/15/12 2125 10/15/12 1706 10/15/12 1130 10/15/12 0756  GLUCAP 86 108* 128* 153* 89    Recent Results (from the past 240 hour(s))  URINE CULTURE     Status: Normal   Collection Time   10/13/12  8:17 PM      Component Value Range Status Comment   Specimen Description URINE, CATHETERIZED   Final     Special Requests NONE   Final    Culture  Setup Time 10/14/2012 09:13   Final    Colony Count NO GROWTH   Final    Culture NO GROWTH   Final    Report Status 10/15/2012 FINAL   Final      Studies: No results found.  Scheduled Meds:    . budesonide-formoterol  2 puff Inhalation BID  . clonazePAM  1 mg Oral TID  . heparin  5,000 Units Subcutaneous Q8H  . hydrOXYzine  50 mg Oral TID  . insulin aspart  0-9 Units Subcutaneous TID WC  . levothyroxine  50 mcg Oral QAC breakfast  . predniSONE  15 mg Oral Q breakfast  . [DISCONTINUED] levothyroxine  75 mcg Oral QAC breakfast   Continuous Infusions:    . sodium chloride 100 mL/hr at 10/15/12 0454    Principal Problem:  *Nausea & vomiting    Time spent: 30     [facility]  [facility] Pager [phone]. If 8PM-8AM, please contact night-coverage at [email or web address], password [facility] 10/16/2012, 9:16 AM  LOS: 3 days

## 2012-10-16 NOTE — Progress Notes (Signed)
Nutrition Education Note  RD consulted for nutrition education.  Pt with questions re: normal nutrition and best management of Addison's disease.   Lab Results  Component Value Date   HGBA1C 6.8* 06/27/2011    RD provided "Normal Nutrition" handout from the [employer]. Reviewed patient's dietary recall. Provided examples on ways to increase intake and encourage appetite. Discouraged intake of processed foods and use of salt shaker- pt denies craving/wanting salty food. Encouraged fresh fruits and vegetables as well as whole grain sources of carbohydrates to maximize fiber intake.  Encouraged pt to schedule meals.  Expect good compliance.  Body mass index is 33.64 kg/(m^2). Pt meets criteria for obese based on current BMI.  Current diet order is CHO Mod Med, patient is consuming approximately 25% of meals at this time. Labs and medications reviewed. No further nutrition interventions warranted at this time. RD contact information provided. If additional nutrition issues arise, please re-consult RD.  Loyce Dys, MS RD LDN Clinical Inpatient Dietitian Pager: [phone] Weekend/After hours pager: [phone]

## 2012-10-17 LAB — BASIC METABOLIC PANEL
Calcium: 8.7 mg/dL (ref 8.4–10.5)
GFR calc Af Amer: >90 mL/min (ref >90)
GFR calc non Af Amer: >90 mL/min (ref >90)
Glucose, Bld: 168 mg/dL — ABNORMAL HIGH (ref 70–99)
Sodium: 138 mEq/L (ref 135–145)

## 2012-10-17 LAB — HEMOGLOBIN A1C: Hgb A1c MFr Bld: 6.1 % — ABNORMAL HIGH (ref <5.7)

## 2012-10-17 LAB — GLUCOSE, CAPILLARY: Glucose-Capillary: 101 mg/dL — ABNORMAL HIGH (ref 70–99)

## 2012-10-17 MED ORDER — POTASSIUM CHLORIDE CRYS ER 20 MEQ PO TBCR
40.0000 meq | EXTENDED_RELEASE_TABLET | Freq: Two times a day (BID) | ORAL | Status: AC
  Administered 2012-10-17 (×2): 40 meq via ORAL
  Filled 2012-10-17 (×2): qty 2

## 2012-10-17 MED ORDER — PREDNISONE 10 MG PO TABS
10.0000 mg | ORAL_TABLET | Freq: Every day | ORAL | Status: DC
  Administered 2012-10-18: 10 mg via ORAL
  Filled 2012-10-17 (×2): qty 1

## 2012-10-17 NOTE — Progress Notes (Signed)
[facility] PROGRESS NOTE  Daisy [name]:[id number] DOB: 12/10/1950 DOA: 10/13/2012 PCP: Daisy Floro, MD  Assessment/Plan:  Nausea, vomiting, diarrhea.  Liver function tests and lipase within normal limits.  Urinalysis was notable for ketones.  Symptoms improving.  No diarrhea since admission -  urine culture negative -  Cortisol level normal-high -  Still working on her diet. Was able to eat little for breakfast.   Rash, patient states that she has had similar rash intermittently since she was diagnosed with Addison's disease, however, it is more painful today.    Tachycardia:  Resolved.  - May be related to  acute illness, steroids, SIRS, benzo withdrawal as patient is on klonopin at home and has not taken in several days.  Also, she states that she has been becoming hyperthyroid recently per Dr. Rosalita Chessman. Spoke to Dr Rosalita Chessman, who reported that the patient is not hyperthyroid and we can go back to her home dose of 75 mcg of synthroid. Her TSH level is low but free t4 level is normal.  -  Continue klonopin with ativan PRN - d/c telemetry.   Headache, migrainous with typical photo and phonophobia, resolved with aspirin.    Diabetes, diet controlled:  Fingersticks stable -  SSI (starting diet) - Hgba1c pending.   HTN: blood pressure stable while holding home blood pressure medications.   -  Monitor for now -   Hold BP medications   Breathing stable.  Continue symbicort  DIET:  Regular diet.  ACCESS:  DC ed central line groin (leaking) IVF:  OFF PROPH:  heparin  Code Status: full code Family Communication: With patient , none at  Bedside.  Disposition Plan: Pending improvement in nausea, vomiting, tolerating PO, on oral medications.     Consultants:  None  Procedures:  Femoral central line 11/10  Antibiotics:  None  HPI/Subjective:  Headache resolved.Denies diarrhea, constipation. Slowly started eating. Still nauseated.  Objective: Filed Vitals:     10/16/12 2120 10/16/12 2352 10/17/12 0446 10/17/12 1345  BP:   154/67 129/69  Pulse:  78 67 69  Temp:   97.6 F (36.4 C) 98.4 F (36.9 C)  TempSrc:   Oral Oral  Resp:  17 18 16   Height:      Weight:      SpO2: 98% 97% 98% 99%    Intake/Output Summary (Last 24 hours) at 10/17/12 1623 Last data filed at 10/17/12 1500  Gross per 24 hour  Intake    240 ml  Output   2575 ml  Net  -2335 ml   Filed Weights   10/14/12 0100  Weight: 87.5 kg (192 lb 14.4 oz)    Exam:   General:  Caucasian female, no acute distress, lying in bed  HEENT:  MMM  Cardiovascular: Tachcardic, regular rhythm, no murmurs, rubs, or gallops  Respiratory: CTAB  Abdomen: NABS, soft, nondistended, nontender, no organomegaly  MSK:  No LEE  NEuro:  Patient appears  Slightly anxious  Tremor and tongue fasciculations still present  Data Reviewed: Basic Metabolic Panel:  Lab 10/17/12 8119 10/16/12 1101 10/14/12 2325 10/14/12 0440 10/13/12 2033  NA 138 139 -- 137 136  K 3.6 3.0* -- 3.9 4.2  CL 101 104 -- 100 96  CO2 24 24 -- 23 26  GLUCOSE 168* 135* -- 138* 84  BUN 11 15 -- 13 12  CREATININE 0.63 0.79 -- 0.65 0.73  CALCIUM 8.7 8.3* -- 9.0 9.3  MG 1.7 -- 1.8 -- --  PHOS -- -- -- -- --  Liver Function Tests:  Lab 10/13/12 2033  AST 36  ALT 25  ALKPHOS 76  BILITOT 0.4  PROT 7.5  ALBUMIN 3.5   No results found for this basename: LIPASE:5,AMYLASE:5 in the last 168 hours No results found for this basename: AMMONIA:5 in the last 168 hours CBC:  Lab 10/16/12 1101 10/13/12 1855  WBC 8.1 10.6*  NEUTROABS -- 8.2*  HGB 13.1 15.7*  HCT 39.7 45.0  MCV 92.3 89.8  PLT 190 243   Cardiac Enzymes: No results found for this basename: CKTOTAL:5,CKMB:5,CKMBINDEX:5,TROPONINI:5 in the last 168 hours BNP (last 3 results) No results found for this basename: PROBNP:3 in the last 8760 hours CBG:  Lab 10/17/12 1229 10/17/12 0742 10/16/12 2026 10/16/12 1739 10/16/12 1221  GLUCAP 101* 71 119* 162* 136*     Recent Results (from the past 240 hour(s))  URINE CULTURE     Status: Normal   Collection Time   10/13/12  8:17 PM      Component Value Range Status Comment   Specimen Description URINE, CATHETERIZED   Final    Special Requests NONE   Final    Culture  Setup Time 10/14/2012 09:13   Final    Colony Count NO GROWTH   Final    Culture NO GROWTH   Final    Report Status 10/15/2012 FINAL   Final      Studies: No results found.  Scheduled Meds:    . budesonide-formoterol  2 puff Inhalation BID  . clonazePAM  1 mg Oral TID  . heparin  5,000 Units Subcutaneous Q8H  . hydrOXYzine  50 mg Oral TID  . insulin aspart  0-9 Units Subcutaneous TID WC  . levothyroxine  50 mcg Oral QAC breakfast  . potassium chloride  40 mEq Oral BID  . predniSONE  10 mg Oral Q breakfast  . [DISCONTINUED] predniSONE  15 mg Oral Q breakfast   Continuous Infusions:    . [DISCONTINUED] sodium chloride 100 mL/hr at 10/15/12 1610    Principal Problem:  *Nausea & vomiting    Time spent: 30     [facility]  [facility] Pager [phone]. If 8PM-8AM, please contact night-coverage at [email or web address], password [facility] 10/17/2012, 4:23 PM  LOS: 4 days

## 2012-10-17 NOTE — Progress Notes (Signed)
RT assisted patient setting up home CPAP machine. Bled in 2L of O2 and added sterile water to fill line. Instructed patient to call if she needed any further assistance.

## 2012-10-18 LAB — BASIC METABOLIC PANEL
BUN: 16 mg/dL (ref 6–23)
Calcium: 8.9 mg/dL (ref 8.4–10.5)
Chloride: 102 mEq/L (ref 96–112)
Creatinine, Ser: 0.79 mg/dL (ref 0.50–1.10)
GFR calc Af Amer: >90 mL/min (ref >90)

## 2012-10-18 LAB — GLUCOSE, CAPILLARY: Glucose-Capillary: 103 mg/dL — ABNORMAL HIGH (ref 70–99)

## 2012-10-18 MED ORDER — PREDNISONE 5 MG PO TABS: 7.5000 mg | ORAL_TABLET | Freq: Every day | ORAL | Status: DC

## 2012-10-18 MED ORDER — HYDROXYZINE HCL 50 MG PO TABS: 50.0000 mg | ORAL_TABLET | Freq: Three times a day (TID) | ORAL | Status: DC

## 2012-10-18 NOTE — Discharge Summary (Signed)
Physician Discharge Summary  Daisy [name] OZH:[id number] DOB: [birth year] DOA: 10/13/2012  PCP: Daisy Floro, MD  Admit date: 10/13/2012 Discharge date: 10/18/2012  Time spent: 65 minutes  Recommendations for Outpatient Follow-up:  1. Follow up with Dr Rosalita Chessman as recommended.  Discharge Diagnoses:  Principal Problem:  *Nausea & vomiting Addison's disease Hypertension Hypothyroidism fibromyalgia   Discharge Condition: stable  Diet recommendation: salt diet  Filed Weights   10/14/12 0100  Weight: 87.5 kg (192 lb 14.4 oz)    History of present illness:  61 year old lady with h/o addisons disease came in for persistent nausea and vomiting. She was admitted for addison's crisis. Her steroids were increased to 15  Mg of prednisone.   Hospital Course:  Nausea, vomiting, diarrhea.: probably from addison's crisis.  Liver function tests and lipase within normal limits. Urinalysis was notable for ketones. Symptoms improved. No diarrhea since admission  - urine culture negative  - Cortisol level normal-high. Her prednisone was increased to 15mg  , later tapered to 7.5 at the time of discharge. She was recommended to go back to 5 mg in one week.  - able to tolerate diet without symptoms.   Rash, patient states that she has had similar rash intermittently since she was diagnosed with Addison's disease, .  Tachycardia: Resolved.  - May be related to acute illness, steroids, SIRS, benzo withdrawal as patient is on klonopin at home and has not taken in several days. Also, she states that she has been becoming hyperthyroid recently per Dr. Rosalita Chessman. Spoke to Dr Rosalita Chessman, who reported that the patient is not hyperthyroid and we can go back to her home dose of 75 mcg of synthroid. Her TSH level is low but free t4 level is normal.  - Continue klonopin   Headache, migrainous with typical photo and phonophobia, resolved with aspirin.   Diabetes, diet controlled: Fingersticks stable  - SSI  (starting diet)  - Hgba1c is 6.1  HTN: blood pressure stable , resume  home blood pressure medications.  Breathing stable. Continue symbicort   Discharge Exam: Filed Vitals:   10/17/12 2044 10/17/12 2204 10/18/12 0430 10/18/12 0852  BP:  155/79 157/75   Pulse:  79 77   Temp:  98 F (36.7 C) 97.6 F (36.4 C)   TempSrc:  Oral Oral   Resp:  16 18   Height:      Weight:      SpO2: 96% 96% 98% 97%   General: Caucasian female, no acute distress, lying in bed  Cardiovascular:  regular  Rate rhythm, no murmurs, rubs, or gallops  Respiratory: CTAB  Abdomen: NABS, soft, nondistended, nontender, no organomegaly  MSK: No LEE    Discharge Instructions  Discharge Orders    Future Appointments: Provider: Department: Dept Phone: Center:   11/04/2012 1:30 PM Linard Millers, [facility] [phone] None   11/04/2012 2:45 PM Windell Hummingbird [facility] [phone] None   01/07/2013 11:00 AM Radene Gunning [facility] [phone] None   01/07/2013 11:30 AM Reece Packer, MD [facility] MEDICAL ONCOLOGY [phone] None   06/20/2013 10:00 AM Delcie Roch [facility] [phone] None   06/20/2013 10:30 AM Reece Packer, MD [place] [facility] [phone] None     Future Orders Please Complete By Expires   Diet general      Discharge instructions  Comments:   FOLLOW UP WITH DR SPRATT AS RECOMMENDED.   Activity as tolerated - No restrictions          Medication List     As of 10/18/2012 10:59 PM    TAKE these medications         budesonide-formoterol 160-4.5 MCG/ACT inhaler   Commonly known as: SYMBICORT   Inhale 2 puffs into the lungs 2 (two) times daily.      CALTRATE 600+D PO   Take 1 tablet by mouth daily.      clonazePAM 1 MG tablet   Commonly known as: KLONOPIN   Take 1 mg by mouth 3 (three)  times daily.      CORTIZONE-10 ECZEMA EX   Apply 1 application topically 2 (two) times daily as needed. For eczema      cyclobenzaprine 10 MG tablet   Commonly known as: FLEXERIL   Take 5-10 mg by mouth 3 (three) times daily. Muscle spasms      fish oil-omega-3 fatty acids 1000 MG capsule   Take 1 g by mouth daily.      furosemide 20 MG tablet   Commonly known as: LASIX   Take 20 mg by mouth daily.      hydrOXYzine 50 MG tablet   Commonly known as: ATARAX/VISTARIL   Take 1 tablet (50 mg total) by mouth 3 (three) times daily. For itching      INTEGRA 62.5-62.5-40-3 MG Caps   Take 1 capsule by mouth daily. TAKE WITH 1 VITAMIN C TAB, 1 HOUR BEFORE MEAL OR 2 HOURS AFTER MEAL.      (IRON)      levalbuterol 45 MCG/ACT inhaler   Commonly known as: XOPENEX HFA   Inhale 2 puffs into the lungs every 4 (four) hours as needed. Shortness of breath.      levothyroxine 75 MCG tablet   Commonly known as: SYNTHROID, LEVOTHROID   Take 75 mcg by mouth at bedtime.      losartan-hydrochlorothiazide 100-12.5 MG per tablet   Commonly known as: HYZAAR   Take 1 tablet by mouth daily.      Magnesium Chloride 535 (64 MG) MG Tbcr   Take 1 tablet (64 mg total) by mouth 2 (two) times daily.      morphine 30 MG tablet   Commonly known as: MSIR   Take 60 mg by mouth every 4 (four) hours as needed. Pain.      omeprazole 40 MG capsule   Commonly known as: PRILOSEC   Take 40 mg by mouth at bedtime.      ondansetron 4 MG tablet   Commonly known as: ZOFRAN   Take 8 mg by mouth every 8 (eight) hours as needed. For nausea      potassium chloride SA 20 MEQ tablet   Commonly known as: K-DUR,KLOR-CON   Take 1 tablet (20 mEq total) by mouth 2 (two) times daily.      predniSONE 5 MG tablet   Commonly known as: DELTASONE   Take 5 mg by mouth daily.      predniSONE 5 MG tablet   Commonly known as: DELTASONE   Take 1.5 tablets (7.5 mg total) by mouth daily.      riboflavin 100 MG Tabs   Commonly  known as: VITAMIN B-2   Take 100 mg by mouth daily.      senna 8.6 MG Tabs   Commonly known as: SENOKOT   Take 1 tablet (8.6 mg total) by mouth  2 (two) times daily.      vitamin B-1 250 MG tablet   Take 250 mg by mouth daily.      Vitamin D3 2000 UNITS Tabs   Take 1 tablet by mouth daily.           Follow-up Information    Follow up with Dr. Rosalita Chessman. (Follow up with MD at [facility])           The results of significant diagnostics from this hospitalization (including imaging, microbiology, ancillary and laboratory) are listed below for reference.    Significant Diagnostic Studies: Dg Chest Portable 1 View  10/13/2012  *RADIOLOGY REPORT*  Clinical Data: 61 year old female nausea vomiting diarrhea.  PORTABLE CHEST - 1 VIEW  Comparison: 08/14/2012 and earlier.  Findings: Portable AP view 2113 hours.  Postoperative changes again noted to the left axilla, cervical ACDF hardware, and thoracic spinal stimulator devices.  Stable cardiac size and mediastinal contours.   No pneumothorax or pneumoperitoneum.  No pulmonary edema, pleural effusion or acute pulmonary opacity.  IMPRESSION: No acute cardiopulmonary abnormality.   Original Report Authenticated By: Erskine Speed, M.D.     Microbiology: Recent Results (from the past 240 hour(s))  URINE CULTURE     Status: Normal   Collection Time   10/13/12  8:17 PM      Component Value Range Status Comment   Specimen Description URINE, CATHETERIZED   Final    Special Requests NONE   Final    Culture  Setup Time 10/14/2012 09:13   Final    Colony Count NO GROWTH   Final    Culture NO GROWTH   Final    Report Status 10/15/2012 FINAL   Final      Labs: Basic Metabolic Panel:  Lab 10/18/12 1610 10/17/12 1505 10/16/12 1101 10/14/12 2325 10/14/12 0440 10/13/12 2033  NA 139 138 139 -- 137 136  K 3.9 3.6 3.0* -- 3.9 4.2  CL 102 101 104 -- 100 96  CO2 27 24 24  -- 23 26  GLUCOSE 111* 168* 135* -- 138* 84  BUN 16 11 15  -- 13 12  CREATININE 0.79  0.63 0.79 -- 0.65 0.73  CALCIUM 8.9 8.7 8.3* -- 9.0 9.3  MG 1.9 1.7 -- 1.8 -- --  PHOS -- -- -- -- -- --   Liver Function Tests:  Lab 10/13/12 2033  AST 36  ALT 25  ALKPHOS 76  BILITOT 0.4  PROT 7.5  ALBUMIN 3.5   No results found for this basename: LIPASE:5,AMYLASE:5 in the last 168 hours No results found for this basename: AMMONIA:5 in the last 168 hours CBC:  Lab 10/16/12 1101 10/13/12 1855  WBC 8.1 10.6*  NEUTROABS -- 8.2*  HGB 13.1 15.7*  HCT 39.7 45.0  MCV 92.3 89.8  PLT 190 243   Cardiac Enzymes: No results found for this basename: CKTOTAL:5,CKMB:5,CKMBINDEX:5,TROPONINI:5 in the last 168 hours BNP: BNP (last 3 results) No results found for this basename: PROBNP:3 in the last 8760 hours CBG:  Lab 10/18/12 1121 10/18/12 0755 10/17/12 2037 10/17/12 1722 10/17/12 1229  GLUCAP 103* 82 93 122* 101*       Signed:  Earlene Bjelland  [facility] 10/18/2012, 10:59 PM

## 2012-10-24 ENCOUNTER — Ambulatory Visit (INDEPENDENT_AMBULATORY_CARE_PROVIDER_SITE_OTHER): Payer: BC Managed Care – PPO | Admitting: General Surgery

## 2012-10-24 ENCOUNTER — Encounter (INDEPENDENT_AMBULATORY_CARE_PROVIDER_SITE_OTHER): Payer: Self-pay | Admitting: General Surgery

## 2012-10-24 DIAGNOSIS — E271 Primary adrenocortical insufficiency: Secondary | ICD-10-CM

## 2012-10-24 DIAGNOSIS — E2749 Other adrenocortical insufficiency: Secondary | ICD-10-CM

## 2012-10-24 NOTE — Patient Instructions (Signed)
No heavy activity with your right arm for one week after the Port-a-cath is put in.

## 2012-10-24 NOTE — Progress Notes (Signed)
Patient ID: Daisy [name], female   DOB: 05/23/1951, 61 y.o.   MRN: [id number]  No chief complaint on file.   HPI Daisy [name] is a 61 y.o. female.   HPI  She is referred by Dr. Ross because of Addison's disease and poor vascular access. She requires frequent intravenous infusions. She was recently in the hospital and had to have a femoral line placed just for vascular access. She needs long-term vascular access and has been sent over here to discuss a Port-A-Cath placement.  Past Medical History  Diagnosis Date  . Asthma   . Hypertension   . Shingles   . Sleep apnea   . OSA on CPAP   . Adenocarcinoma of breast 07/13/1992  . Hypothyroidism   . H/O esophageal spasm     "I can have as many as 14 TUMS before they stop"  . Radiation adverse effect     "100% blockage on left; 50% on right salivary; can drain w/massage"  . Diabetes mellitus     "diet controlled"  . Restless leg syndrome, controlled 02/19/12    "much better since being on CPAP; been on that ~ 1 month now"  . COPD (chronic obstructive pulmonary disease)   . Pneumonia     "I've had it several times"  . Chronic bronchitis   . Positive TB test     "ran deep in my father's family; went on INH for awhile; it caused migraines"  . Adrenal insufficiency     "w/secondary Addison's"  . Anemia   . GERD (gastroesophageal reflux disease)   . H/O hiatal hernia   . Upper GI bleeding 1970-1980    "had 4"  . Hepatitis B infection 1971    "in high school; after drinking after football player"  . Migraines     "much better now" (02/19/12)  . Headache     "when I don't eat" (02/19/12)  . Anxiety   . Arthritis   . Fibromyalgia   . Multiple sclerosis 05/1988    "dr's in  don't think I have it; dr's in [place] still do" (02/19/12)    Past Surgical History  Procedure Date  . Tonsillectomy and adenoidectomy 1964  . Mastectomy 2010    left  . Appendectomy 1960's  . Cholecystectomy 1980's  . Abdominal hysterectomy 1987    . Bilateral oophorectomy 1988  . Breast lumpectomy 07/13/1992    left  . Spinal cord stimulator insertion ~ 2004  . Back surgery ~ 2004    "chipped disk out to put in permanent stimulator"  . Band hemorrhoidectomy 1979  . Salivary gland surgery ~ 2002    "left was 100% blocked twice; right 50%; only had one surgery"    History reviewed. No pertinent family history.  Social History History  Substance Use Topics  . Smoking status: Never Smoker   . Smokeless tobacco: Never Used  . Alcohol Use: No    Allergies  Allergen Reactions  . Amoxicillin-Pot Clavulanate Other (See Comments)    Bad headaches  . Compazine Other (See Comments)    Hyper and shaky  . Iohexol      Desc: ANAPHYLACTIC REACTION!-kmr-10/16/05   . Ivp Dye (Iodinated Diagnostic Agents) Anaphylaxis and Other (See Comments)    Fever and blood clot  . Lisinopril Cough  . Metformin And Related     Migraine   . Metoclopramide Hcl Other (See Comments)    Hyper, shaky  . Promethazine Hcl Other (See Comments)      Hyper,,,,shaky  . Sulfa Antibiotics Swelling    "alot in my abdomen"  . Terbinafine Hcl Other (See Comments)    Throat closed up.  . Vancomycin Swelling  . Guaifenesin Other (See Comments)    hyper  . Amlodipine Swelling  . Furosemide Other (See Comments)    Leg cramps    Current Outpatient Prescriptions  Medication Sig Dispense Refill  . budesonide-formoterol (SYMBICORT) 160-4.5 MCG/ACT inhaler Inhale 2 puffs into the lungs 2 (two) times daily.        . Calcium Carbonate-Vitamin D (CALTRATE 600+D PO) Take 1 tablet by mouth daily.       . Cholecalciferol (VITAMIN D3) 2000 UNITS TABS Take 1 tablet by mouth daily.      . clonazePAM (KLONOPIN) 1 MG tablet Take 1 mg by mouth 3 (three) times daily.        . cyclobenzaprine (FLEXERIL) 10 MG tablet Take 5-10 mg by mouth 3 (three) times daily. Muscle spasms      . Fe Fum-FePoly-Vit C-Vit B3 (INTEGRA) 62.5-62.5-40-3 MG CAPS Take 1 capsule by mouth daily.  TAKE WITH 1 VITAMIN C TAB, 1 HOUR BEFORE MEAL OR 2 HOURS AFTER MEAL.      (IRON)      . fish oil-omega-3 fatty acids 1000 MG capsule Take 1 g by mouth daily.        . furosemide (LASIX) 20 MG tablet Take 20 mg by mouth daily.      . Hydrocortisone (CORTIZONE-10 ECZEMA EX) Apply 1 application topically 2 (two) times daily as needed. For eczema       . hydrOXYzine (ATARAX/VISTARIL) 50 MG tablet Take 1 tablet (50 mg total) by mouth 3 (three) times daily. For itching  30 tablet  1  . levalbuterol (XOPENEX HFA) 45 MCG/ACT inhaler Inhale 2 puffs into the lungs every 4 (four) hours as needed. Shortness of breath.       . levothyroxine (SYNTHROID, LEVOTHROID) 75 MCG tablet Take 75 mcg by mouth at bedtime.       . losartan-hydrochlorothiazide (HYZAAR) 100-12.5 MG per tablet Take 1 tablet by mouth daily.        . Magnesium Chloride (SLOW-MAG) 535 (64 MG) MG TBCR Take 1 tablet (64 mg total) by mouth 2 (two) times daily.  60 tablet  0  . morphine (MSIR) 30 MG tablet Take 60 mg by mouth every 4 (four) hours as needed. Pain.      . omeprazole (PRILOSEC) 40 MG capsule Take 40 mg by mouth at bedtime.      . ondansetron (ZOFRAN) 4 MG tablet Take 8 mg by mouth every 8 (eight) hours as needed. For nausea      . potassium chloride SA (K-DUR,KLOR-CON) 20 MEQ tablet Take 1 tablet (20 mEq total) by mouth 2 (two) times daily.  60 tablet  0  . predniSONE (DELTASONE) 5 MG tablet Take 5 mg by mouth daily.        . predniSONE (DELTASONE) 5 MG tablet Take 1.5 tablets (7.5 mg total) by mouth daily.  7 tablet  0  . riboflavin (VITAMIN B-2) 100 MG TABS Take 100 mg by mouth daily.        . senna (SENOKOT) 8.6 MG TABS Take 1 tablet (8.6 mg total) by mouth 2 (two) times daily.  120 each    . Thiamine HCl (VITAMIN B-1) 250 MG tablet Take 250 mg by mouth daily.      . [DISCONTINUED] esomeprazole (NEXIUM) 40 MG capsule Take 40 mg by   mouth 2 (two) times daily.       . [DISCONTINUED] spironolactone (ALDACTONE) 25 MG tablet Take 25 mg by  mouth 2 (two) times daily. For swelling         Review of Systems Review of Systems  Constitutional: Positive for fatigue.  Cardiovascular: Positive for leg swelling.  Musculoskeletal: Positive for back pain and arthralgias.    There were no vitals taken for this visit.  Physical Exam Physical Exam  Constitutional:       Overweight female in NAD.  HENT:  Head: Normocephalic and atraumatic.  Neck: Neck supple.  Pulmonary/Chest: Effort normal and breath sounds normal.       Left chest wall scar and amastia.  Small right upper chest wall scar.    Data Reviewed Notes from Dr. Ross.  Hospital notes.  Assessment    Addison's disease and poor vascular access. She requires frequent infusions throughout the year. She does need long-term vascular access in my opinion as well as the opinion of Dr. Ross    Plan    Ultrasound-guided Port-A-Cath insertion.  The procedure risks and aftercare been explained. Risks include but are not limited to bleeding, infection, malfunction, pneumothorax, wound problems, DVT.       Damary Doland J 10/24/2012, 4:17 PM    

## 2012-10-28 ENCOUNTER — Encounter (HOSPITAL_COMMUNITY): Payer: Self-pay | Admitting: *Deleted

## 2012-10-28 ENCOUNTER — Encounter (HOSPITAL_COMMUNITY): Payer: Self-pay | Admitting: Pharmacy Technician

## 2012-10-30 ENCOUNTER — Ambulatory Visit (HOSPITAL_COMMUNITY): Payer: BC Managed Care – PPO | Admitting: Registered Nurse

## 2012-10-30 ENCOUNTER — Ambulatory Visit (HOSPITAL_COMMUNITY): Payer: BC Managed Care – PPO

## 2012-10-30 ENCOUNTER — Encounter (HOSPITAL_COMMUNITY): Payer: Self-pay | Admitting: *Deleted

## 2012-10-30 ENCOUNTER — Encounter (HOSPITAL_COMMUNITY): Payer: Self-pay | Admitting: Registered Nurse

## 2012-10-30 ENCOUNTER — Ambulatory Visit (HOSPITAL_COMMUNITY)
Admission: RE | Admit: 2012-10-30 | Discharge: 2012-10-30 | Disposition: A | Discharge status: Home / Self Care | Payer: BC Managed Care – PPO | Source: Ambulatory Visit | Attending: General Surgery | Admitting: General Surgery

## 2012-10-30 ENCOUNTER — Encounter (INDEPENDENT_AMBULATORY_CARE_PROVIDER_SITE_OTHER): Payer: Self-pay | Admitting: General Surgery

## 2012-10-30 ENCOUNTER — Encounter (HOSPITAL_COMMUNITY)
Admission: RE | Disposition: A | Discharge status: Home / Self Care | Payer: Self-pay | Source: Ambulatory Visit | Attending: General Surgery

## 2012-10-30 ENCOUNTER — Other Ambulatory Visit (INDEPENDENT_AMBULATORY_CARE_PROVIDER_SITE_OTHER): Payer: Self-pay | Admitting: General Surgery

## 2012-10-30 DIAGNOSIS — E2749 Other adrenocortical insufficiency: Secondary | ICD-10-CM | POA: Insufficient documentation

## 2012-10-30 DIAGNOSIS — E039 Hypothyroidism, unspecified: Secondary | ICD-10-CM | POA: Insufficient documentation

## 2012-10-30 DIAGNOSIS — E119 Type 2 diabetes mellitus without complications: Secondary | ICD-10-CM | POA: Insufficient documentation

## 2012-10-30 DIAGNOSIS — I10 Essential (primary) hypertension: Secondary | ICD-10-CM | POA: Insufficient documentation

## 2012-10-30 DIAGNOSIS — K219 Gastro-esophageal reflux disease without esophagitis: Secondary | ICD-10-CM | POA: Insufficient documentation

## 2012-10-30 DIAGNOSIS — Z79899 Other long term (current) drug therapy: Secondary | ICD-10-CM | POA: Insufficient documentation

## 2012-10-30 DIAGNOSIS — G4733 Obstructive sleep apnea (adult) (pediatric): Secondary | ICD-10-CM | POA: Insufficient documentation

## 2012-10-30 DIAGNOSIS — L905 Scar conditions and fibrosis of skin: Secondary | ICD-10-CM | POA: Insufficient documentation

## 2012-10-30 DIAGNOSIS — I872 Venous insufficiency (chronic) (peripheral): Secondary | ICD-10-CM

## 2012-10-30 DIAGNOSIS — IMO0001 Reserved for inherently not codable concepts without codable children: Secondary | ICD-10-CM | POA: Insufficient documentation

## 2012-10-30 DIAGNOSIS — K449 Diaphragmatic hernia without obstruction or gangrene: Secondary | ICD-10-CM | POA: Insufficient documentation

## 2012-10-30 DIAGNOSIS — J449 Chronic obstructive pulmonary disease, unspecified: Secondary | ICD-10-CM | POA: Insufficient documentation

## 2012-10-30 DIAGNOSIS — J4489 Other specified chronic obstructive pulmonary disease: Secondary | ICD-10-CM | POA: Insufficient documentation

## 2012-10-30 HISTORY — DX: Spontaneous ecchymoses: R23.3

## 2012-10-30 HISTORY — DX: Other skin changes: R23.8

## 2012-10-30 HISTORY — PX: PORTACATH PLACEMENT: SHX2246

## 2012-10-30 HISTORY — DX: Stress incontinence (female) (male): N39.3

## 2012-10-30 LAB — SURGICAL PCR SCREEN: Staphylococcus aureus: NEGATIVE

## 2012-10-30 SURGERY — INSERTION, TUNNELED CENTRAL VENOUS DEVICE, WITH PORT
Anesthesia: Monitor Anesthesia Care | Site: Chest | Laterality: Right | Wound class: Clean

## 2012-10-30 MED ORDER — LIDOCAINE-EPINEPHRINE 1 %-1:100000 IJ SOLN
INTRAMUSCULAR | Status: AC
  Filled 2012-10-30: qty 1

## 2012-10-30 MED ORDER — EPHEDRINE SULFATE 50 MG/ML IJ SOLN
INTRAMUSCULAR | Status: DC | PRN
  Administered 2012-10-30 (×3): 5 mg via INTRAVENOUS

## 2012-10-30 MED ORDER — SODIUM CHLORIDE 0.9 % IJ SOLN: 3.0000 mL | INTRAMUSCULAR | Status: DC | PRN

## 2012-10-30 MED ORDER — PHENYLEPHRINE HCL 10 MG/ML IJ SOLN
INTRAMUSCULAR | Status: DC | PRN
  Administered 2012-10-30 (×2): 80 ug via INTRAVENOUS

## 2012-10-30 MED ORDER — LACTATED RINGERS IV SOLN
INTRAVENOUS | Status: DC
  Administered 2012-10-30: 10:00:00 via INTRAVENOUS

## 2012-10-30 MED ORDER — ONDANSETRON HCL 4 MG/2ML IJ SOLN: 4.0000 mg | Freq: Four times a day (QID) | INTRAMUSCULAR | Status: DC | PRN

## 2012-10-30 MED ORDER — PROPOFOL 10 MG/ML IV BOLUS
INTRAVENOUS | Status: DC | PRN
  Administered 2012-10-30: 200 mg via INTRAVENOUS

## 2012-10-30 MED ORDER — SODIUM CHLORIDE 0.9 % IR SOLN
Status: DC | PRN
  Administered 2012-10-30: 09:00:00

## 2012-10-30 MED ORDER — ONDANSETRON HCL 4 MG/2ML IJ SOLN
INTRAMUSCULAR | Status: DC | PRN
  Administered 2012-10-30: 4 mg via INTRAVENOUS

## 2012-10-30 MED ORDER — MIDAZOLAM HCL 5 MG/5ML IJ SOLN
INTRAMUSCULAR | Status: DC | PRN
  Administered 2012-10-30: 2 mg via INTRAVENOUS

## 2012-10-30 MED ORDER — HEPARIN SOD (PORK) LOCK FLUSH 100 UNIT/ML IV SOLN
INTRAVENOUS | Status: DC | PRN
  Administered 2012-10-30: 250 [IU] via INTRAVENOUS

## 2012-10-30 MED ORDER — CLINDAMYCIN PHOSPHATE 900 MG/50ML IV SOLN
INTRAVENOUS | Status: AC
  Filled 2012-10-30: qty 50

## 2012-10-30 MED ORDER — HEPARIN SOD (PORK) LOCK FLUSH 100 UNIT/ML IV SOLN
INTRAVENOUS | Status: AC
  Filled 2012-10-30: qty 10

## 2012-10-30 MED ORDER — OXYCODONE HCL 5 MG PO TABS
5.0000 mg | ORAL_TABLET | ORAL | Status: DC | PRN
  Administered 2012-10-30: 5 mg via ORAL
  Filled 2012-10-30: qty 1

## 2012-10-30 MED ORDER — LIDOCAINE-EPINEPHRINE 1 %-1:100000 IJ SOLN
INTRAMUSCULAR | Status: DC | PRN
  Administered 2012-10-30: 13 mL

## 2012-10-30 MED ORDER — FENTANYL CITRATE 0.05 MG/ML IJ SOLN
INTRAMUSCULAR | Status: DC | PRN
  Administered 2012-10-30: 25 ug via INTRAVENOUS
  Administered 2012-10-30: 50 ug via INTRAVENOUS

## 2012-10-30 MED ORDER — ACETAMINOPHEN 10 MG/ML IV SOLN
INTRAVENOUS | Status: DC | PRN
  Administered 2012-10-30: 1000 mg via INTRAVENOUS

## 2012-10-30 MED ORDER — ACETAMINOPHEN 10 MG/ML IV SOLN
INTRAVENOUS | Status: AC
  Filled 2012-10-30: qty 100

## 2012-10-30 MED ORDER — LIDOCAINE HCL (CARDIAC) 20 MG/ML IV SOLN
INTRAVENOUS | Status: DC | PRN
  Administered 2012-10-30: 100 mg via INTRAVENOUS

## 2012-10-30 MED ORDER — CLINDAMYCIN PHOSPHATE 900 MG/50ML IV SOLN
900.0000 mg | INTRAVENOUS | Status: AC
  Administered 2012-10-30: 900 mg via INTRAVENOUS
  Filled 2012-10-30: qty 50

## 2012-10-30 MED ORDER — LACTATED RINGERS IV SOLN
INTRAVENOUS | Status: DC | PRN
  Administered 2012-10-30: 08:00:00 via INTRAVENOUS

## 2012-10-30 MED ORDER — MUPIROCIN 2 % EX OINT
TOPICAL_OINTMENT | Freq: Two times a day (BID) | CUTANEOUS | Status: DC
  Filled 2012-10-30: qty 22

## 2012-10-30 MED ORDER — SODIUM CHLORIDE 0.9 % IR SOLN
Freq: Once | Status: DC
  Filled 2012-10-30: qty 1.2

## 2012-10-30 MED ORDER — FENTANYL CITRATE 0.05 MG/ML IJ SOLN: 25.0000 ug | INTRAMUSCULAR | Status: DC | PRN

## 2012-10-30 SURGICAL SUPPLY — 50 items
ADH SKN CLS APL DERMABOND .7 (GAUZE/BANDAGES/DRESSINGS) ×1
APL SKNCLS STERI-STRIP NONHPOA (GAUZE/BANDAGES/DRESSINGS) ×1
BAG DECANTER FOR FLEXI CONT (MISCELLANEOUS) ×2 IMPLANT
BENZOIN TINCTURE PRP APPL 2/3 (GAUZE/BANDAGES/DRESSINGS) ×2 IMPLANT
BLADE HEX COATED 2.75 (ELECTRODE) ×2 IMPLANT
BLADE SURG 15 STRL LF DISP TIS (BLADE) ×1 IMPLANT
BLADE SURG 15 STRL SS (BLADE) ×2
BLADE SURG SZ11 CARB STEEL (BLADE) ×2 IMPLANT
CHLORAPREP W/TINT 26ML (MISCELLANEOUS) ×2 IMPLANT
CLOTH BEACON ORANGE TIMEOUT ST (SAFETY) ×2 IMPLANT
DECANTER SPIKE VIAL GLASS SM (MISCELLANEOUS) ×2 IMPLANT
DERMABOND ADVANCED (GAUZE/BANDAGES/DRESSINGS) ×1
DERMABOND ADVANCED .7 DNX12 (GAUZE/BANDAGES/DRESSINGS) IMPLANT
DRAPE C-ARM 42X72 X-RAY (DRAPES) ×2 IMPLANT
DRAPE LAPAROTOMY TRNSV 102X78 (DRAPE) ×2 IMPLANT
DRSG TEGADERM 2-3/8X2-3/4 SM (GAUZE/BANDAGES/DRESSINGS) ×2 IMPLANT
DRSG TEGADERM 4X4.75 (GAUZE/BANDAGES/DRESSINGS) ×2 IMPLANT
DRSG TEGADERM 6X8 (GAUZE/BANDAGES/DRESSINGS) IMPLANT
ELECT REM PT RETURN 9FT ADLT (ELECTROSURGICAL) ×2
ELECTRODE REM PT RTRN 9FT ADLT (ELECTROSURGICAL) ×1 IMPLANT
GAUZE SPONGE 4X4 16PLY XRAY LF (GAUZE/BANDAGES/DRESSINGS) ×2 IMPLANT
GLOVE BIOGEL PI IND STRL 7.0 (GLOVE) ×1 IMPLANT
GLOVE BIOGEL PI INDICATOR 7.0 (GLOVE) ×1
GLOVE ECLIPSE 8.0 STRL XLNG CF (GLOVE) ×2 IMPLANT
GLOVE INDICATOR 8.0 STRL GRN (GLOVE) ×4 IMPLANT
GOWN STRL NON-REIN LRG LVL3 (GOWN DISPOSABLE) ×2 IMPLANT
GOWN STRL REIN XL XLG (GOWN DISPOSABLE) ×4 IMPLANT
KIT BARDPORT ISP (Port) IMPLANT
KIT BARDPORT ISP 9.6FR (PORTABLE EQUIPMENT SUPPLIES) IMPLANT
KIT BASIN OR (CUSTOM PROCEDURE TRAY) ×2 IMPLANT
KIT POWER CATH 8FR (Catheter) ×1 IMPLANT
NDL HYPO 25X1 1.5 SAFETY (NEEDLE) ×1 IMPLANT
NEEDLE HYPO 22GX1.5 SAFETY (NEEDLE) IMPLANT
NEEDLE HYPO 25X1 1.5 SAFETY (NEEDLE) ×2 IMPLANT
NS IRRIG 1000ML POUR BTL (IV SOLUTION) ×2 IMPLANT
PACK BASIC VI WITH GOWN DISP (CUSTOM PROCEDURE TRAY) ×2 IMPLANT
PAD TELFA 2X3 NADH STRL (GAUZE/BANDAGES/DRESSINGS) ×1 IMPLANT
PENCIL BUTTON HOLSTER BLD 10FT (ELECTRODE) ×2 IMPLANT
SPONGE GAUZE 4X4 12PLY (GAUZE/BANDAGES/DRESSINGS) ×2 IMPLANT
STRIP CLOSURE SKIN 1/2X4 (GAUZE/BANDAGES/DRESSINGS) ×2 IMPLANT
SUT MNCRL AB 4-0 PS2 18 (SUTURE) ×2 IMPLANT
SUT VIC AB 2-0 SH 18 (SUTURE) ×2 IMPLANT
SUT VIC AB 2-0 SH 27 (SUTURE)
SUT VIC AB 2-0 SH 27X BRD (SUTURE) IMPLANT
SUT VIC AB 3-0 SH 27 (SUTURE)
SUT VIC AB 3-0 SH 27XBRD (SUTURE) IMPLANT
SYR CONTROL 10ML LL (SYRINGE) ×2 IMPLANT
SYRINGE 10CC LL (SYRINGE) ×2 IMPLANT
TAPE PAPER 3X10 WHT MICROPORE (GAUZE/BANDAGES/DRESSINGS) ×1 IMPLANT
TOWEL OR 17X26 10 PK STRL BLUE (TOWEL DISPOSABLE) ×2 IMPLANT

## 2012-10-30 NOTE — Preoperative (Signed)
Beta Blockers   Reason not to administer Beta Blockers:Metoprolol taken at 0600 at11-27-13

## 2012-10-30 NOTE — Anesthesia Postprocedure Evaluation (Signed)
  Anesthesia Post-op Note  Patient: Daisy [name]  Procedure(s) Performed: Procedure(s) (LRB): INSERTION PORT-A-CATH (Right)  Patient Location: PACU  Anesthesia Type: General  Level of Consciousness: awake and alert   Airway and Oxygen Therapy: Patient Spontanous Breathing  Post-op Pain: mild  Post-op Assessment: Post-op Vital signs reviewed, Patient's Cardiovascular Status Stable, Respiratory Function Stable, Patent Airway and No signs of Nausea or vomiting  Last Vitals:  Filed Vitals:   10/30/12 1015  BP: 111/63  Pulse: 70  Temp:   Resp: 10    Post-op Vital Signs: stable   Complications: No apparent anesthesia complications

## 2012-10-30 NOTE — Progress Notes (Signed)
Preoperative diagnosis:  Addison's disease with poor IV access  Postoperative diagnosis:  Same  Procedure: Ultrasound-guided Port-A-Cath insertion into right internal jugular vein under fluoroscopy.  Surgeon: Avel Peace M.D.  Anesthesia: Local (Xylocaine) with MAC.  Indication:  This is a 61 year old female with Addison's disease who requires multiple admissions a year and IV access is required.  IV access, including a PICC, is becoming more difficult and she presents for Port-a-cath insertion for long term venous access.  The procedure, risks, and aftercare were discussed with her preop.  Technique: She was brought to the operating room, placed supine on the operating table, and intravenous sedation was given. A roll was placed under the back and the arms were tucked.  The upper chest wall and neck were sterilely prepped and draped.  Her head was rotated to the left. Using the ultrasound, the right internal jugular vein was identified. Local anesthetic was infiltrated in the skin and subcutaneous tissue just anterior to it. A 16-gauge needle was used to cannulate the right internal jugular vein under ultrasound guidance. A wire was then threaded through the needle into the internal jugular vein and down into the right heart under ultrasound and fluoroscopic guidance.  Local anesthetic was infiltrated into the right upper chest wall. A right upper chest wall incision was made, at a previous scar, and a pocket was created for the Portacath.  An incision was made around the wire in the neck. The catheter was then tunneled from the chest wall incision up through the neck incision.  A dilator- introducer complex was placed over the wire into the superior vena cava. The dilator and wire were then removed and the catheter was threaded through the peel-away sheath introducer into the right heart. The introducer was then peeled away and removed. Under fluoroscopic guidance, the tip of the catheter was  then pulled back until it was at the junction of the superior vena cava and right atrium. The catheter was then connected to the port.  The port aspirated blood and flushed easily.  The port was then anchored to the chest wall with 2-0 Vicryl suture. Concentrated heparin solution was then placed into the port. The port and catheter position were then verified using fluoroscopy. The subcutaneous tissue was then closed over the port with running 3-0 Vicryl suture. The skin incisions were then closed with 4-0 Monocryl subcuticular stitches. Dermabond and sterile dressings were applied.  The procedure was well-tolerated without any apparent complications. The patient was taken to the recovery room in satisfactory condition where a portable chest x-ray is pending.

## 2012-10-30 NOTE — Anesthesia Preprocedure Evaluation (Addendum)
Anesthesia Evaluation  Patient identified by MRN, date of birth, ID band Patient awake    Reviewed: Allergy & Precautions, H&P , NPO status , Patient's Chart, lab work & pertinent test results, reviewed documented beta blocker date and time   Airway Mallampati: III TM Distance: >3 FB Neck ROM: full    Dental No notable dental hx. (+) Teeth Intact and Dental Advisory Given   Pulmonary asthma , sleep apnea, Continuous Positive Airway Pressure Ventilation and Oxygen sleep apnea , COPD COPD inhaler,  Chronic bronchitis breath sounds clear to auscultation  Pulmonary exam normal       Cardiovascular Exercise Tolerance: Good hypertension, Pt. on home beta blockers negative cardio ROS  Rhythm:regular Rate:Normal     Neuro/Psych  Headaches, negative neurological ROS  negative psych ROS   GI/Hepatic negative GI ROS, hiatal hernia, GERD-  Medicated and Controlled,(+) Hepatitis -, B  Endo/Other  diabetes, Well Controlled, Type 2, Oral Hypoglycemic AgentsHypothyroidism Adrenal insufficiency with Addison's.  No meds for DM.  Renal/GU negative Renal ROS  negative genitourinary   Musculoskeletal  (+) Fibromyalgia -  Abdominal   Peds  Hematology negative hematology ROS (+)   Anesthesia Other Findings   Reproductive/Obstetrics negative OB ROS                          Anesthesia Physical Anesthesia Plan  ASA: III  Anesthesia Plan: General   Post-op Pain Management:    Induction: Intravenous  Airway Management Planned: LMA  Additional Equipment:   Intra-op Plan:   Post-operative Plan:   Informed Consent: I have reviewed the patients History and Physical, chart, labs and discussed the procedure including the risks, benefits and alternatives for the proposed anesthesia with the patient or authorized representative who has indicated his/her understanding and acceptance.   Dental Advisory Given  Plan  Discussed with: CRNA and Surgeon  Anesthesia Plan Comments:        Anesthesia Quick Evaluation

## 2012-10-30 NOTE — Transfer of Care (Signed)
Immediate Anesthesia Transfer of Care Note  Patient: Daisy [name]  Procedure(s) Performed: Procedure(s) (LRB) with comments: INSERTION PORT-A-CATH (Right) - Right Ultrasound guided port-a-cath insertion  Patient Location: PACU  Anesthesia Type:General  Level of Consciousness: awake, alert , oriented and patient cooperative  Airway & Oxygen Therapy: Patient Spontanous Breathing and Patient connected to face mask oxygen  Post-op Assessment: Report given to PACU RN, Post -op Vital signs reviewed and stable and Patient moving all extremities  Post vital signs: Reviewed and stable  Complications: No apparent anesthesia complications

## 2012-10-30 NOTE — Interval H&P Note (Signed)
History and Physical Interval Note:  10/30/2012 8:23 AM  Daisy [name]  has presented today for surgery, with the diagnosis of Addison's disease  The various methods of treatment have been discussed with the patient and family. After consideration of risks, benefits and other options for treatment, the patient has consented to  Procedure(s) (LRB) with comments: INSERTION PORT-A-CATH (N/A) - Ultrasound guided port-a-cath insertion as a surgical intervention .  The patient's history has been reviewed, patient examined, no change in status, stable for surgery.  I have reviewed the patient's chart and labs.  Questions were answered to the patient's satisfaction.     Daisy [name] Shela Commons

## 2012-10-30 NOTE — H&P (View-Only) (Signed)
Patient ID: Daisy [name], female   DOB: [birth year], 61 y.o.   MRN: [id number]  No chief complaint on file.   HPI Daisy [name] is a 61 y.o. female.   HPI  She is referred by Dr. Tenny Craw because of Addison's disease and poor vascular access. She requires frequent intravenous infusions. She was recently in the hospital and had to have a femoral line placed just for vascular access. She needs long-term vascular access and has been sent over here to discuss a Port-A-Cath placement.  Past Medical History  Diagnosis Date  . Asthma   . Hypertension   . Shingles   . Sleep apnea   . OSA on CPAP   . Adenocarcinoma of breast 07/13/1992  . Hypothyroidism   . H/O esophageal spasm     "I can have as many as 58 TUMS before they stop"  . Radiation adverse effect     "100% blockage on left; 50% on right salivary; can drain w/massage"  . Diabetes mellitus     "diet controlled"  . Restless leg syndrome, controlled 02/19/12    "much better since being on CPAP; been on that ~ 1 month now"  . COPD (chronic obstructive pulmonary disease)   . Pneumonia     "I've had it several times"  . Chronic bronchitis   . Positive TB test     "ran deep in my father's family; went on INH for awhile; it caused migraines"  . Adrenal insufficiency     "w/secondary Addison's"  . Anemia   . GERD (gastroesophageal reflux disease)   . H/O hiatal hernia   . Upper GI bleeding 1970-1980    "had 4"  . Hepatitis B infection 1971    "in high school; after drinking after football player"  . Migraines     "much better now" (02/19/12)  . Headache     "when I don't eat" (02/19/12)  . Anxiety   . Arthritis   . Fibromyalgia   . Multiple sclerosis 05/1988    "dr's in [place] don't think I have it; dr's in [place] still do" (02/19/12)    Past Surgical History  Procedure Date  . Tonsillectomy and adenoidectomy 1964  . Mastectomy 2010    left  . Appendectomy 1960's  . Cholecystectomy 1980's  . Abdominal hysterectomy 1987    . Bilateral oophorectomy 1988  . Breast lumpectomy 07/13/1992    left  . Spinal cord stimulator insertion ~ 2004  . Back surgery ~ 2004    "chipped disk out to put in permanent stimulator"  . Band hemorrhoidectomy 1979  . Salivary gland surgery ~ 2002    "left was 100% blocked twice; right 50%; only had one surgery"    History reviewed. No pertinent family history.  Social History History  Substance Use Topics  . Smoking status: Never Smoker   . Smokeless tobacco: Never Used  . Alcohol Use: No    Allergies  Allergen Reactions  . Amoxicillin-Pot Clavulanate Other (See Comments)    Bad headaches  . Compazine Other (See Comments)    Hyper and shaky  . Iohexol      Desc: ANAPHYLACTIC REACTION!-kmr-10/16/05   . Ivp Dye (Iodinated Diagnostic Agents) Anaphylaxis and Other (See Comments)    Fever and blood clot  . Lisinopril Cough  . Metformin And Related     Migraine   . Metoclopramide Hcl Other (See Comments)    Hyper, shaky  . Promethazine Hcl Other (See Comments)  Hyper,,,,shaky  . Sulfa Antibiotics Swelling    "alot in my abdomen"  . Terbinafine Hcl Other (See Comments)    Throat closed up.  . Vancomycin Swelling  . Guaifenesin Other (See Comments)    hyper  . Amlodipine Swelling  . Furosemide Other (See Comments)    Leg cramps    Current Outpatient Prescriptions  Medication Sig Dispense Refill  . budesonide-formoterol (SYMBICORT) 160-4.5 MCG/ACT inhaler Inhale 2 puffs into the lungs 2 (two) times daily.        . Calcium Carbonate-Vitamin D (CALTRATE 600+D PO) Take 1 tablet by mouth daily.       . Cholecalciferol (VITAMIN D3) 2000 UNITS TABS Take 1 tablet by mouth daily.      . clonazePAM (KLONOPIN) 1 MG tablet Take 1 mg by mouth 3 (three) times daily.        . cyclobenzaprine (FLEXERIL) 10 MG tablet Take 5-10 mg by mouth 3 (three) times daily. Muscle spasms      . Fe Fum-FePoly-Vit C-Vit B3 (INTEGRA) 62.5-62.5-40-3 MG CAPS Take 1 capsule by mouth daily.  TAKE WITH 1 VITAMIN C TAB, 1 HOUR BEFORE MEAL OR 2 HOURS AFTER MEAL.      (IRON)      . fish oil-omega-3 fatty acids 1000 MG capsule Take 1 g by mouth daily.        . furosemide (LASIX) 20 MG tablet Take 20 mg by mouth daily.      . Hydrocortisone (CORTIZONE-10 ECZEMA EX) Apply 1 application topically 2 (two) times daily as needed. For eczema       . hydrOXYzine (ATARAX/VISTARIL) 50 MG tablet Take 1 tablet (50 mg total) by mouth 3 (three) times daily. For itching  30 tablet  1  . levalbuterol (XOPENEX HFA) 45 MCG/ACT inhaler Inhale 2 puffs into the lungs every 4 (four) hours as needed. Shortness of breath.       . levothyroxine (SYNTHROID, LEVOTHROID) 75 MCG tablet Take 75 mcg by mouth at bedtime.       Marland Kitchen losartan-hydrochlorothiazide (HYZAAR) 100-12.5 MG per tablet Take 1 tablet by mouth daily.        . Magnesium Chloride (SLOW-MAG) 535 (64 MG) MG TBCR Take 1 tablet (64 mg total) by mouth 2 (two) times daily.  60 tablet  0  . morphine (MSIR) 30 MG tablet Take 60 mg by mouth every 4 (four) hours as needed. Pain.      Marland Kitchen omeprazole (PRILOSEC) 40 MG capsule Take 40 mg by mouth at bedtime.      . ondansetron (ZOFRAN) 4 MG tablet Take 8 mg by mouth every 8 (eight) hours as needed. For nausea      . potassium chloride SA (K-DUR,KLOR-CON) 20 MEQ tablet Take 1 tablet (20 mEq total) by mouth 2 (two) times daily.  60 tablet  0  . predniSONE (DELTASONE) 5 MG tablet Take 5 mg by mouth daily.        . predniSONE (DELTASONE) 5 MG tablet Take 1.5 tablets (7.5 mg total) by mouth daily.  7 tablet  0  . riboflavin (VITAMIN B-2) 100 MG TABS Take 100 mg by mouth daily.        Marland Kitchen senna (SENOKOT) 8.6 MG TABS Take 1 tablet (8.6 mg total) by mouth 2 (two) times daily.  120 each    . Thiamine HCl (VITAMIN B-1) 250 MG tablet Take 250 mg by mouth daily.      . [DISCONTINUED] esomeprazole (NEXIUM) 40 MG capsule Take 40 mg by  mouth 2 (two) times daily.       . [DISCONTINUED] spironolactone (ALDACTONE) 25 MG tablet Take 25 mg by  mouth 2 (two) times daily. For swelling         Review of Systems Review of Systems  Constitutional: Positive for fatigue.  Cardiovascular: Positive for leg swelling.  Musculoskeletal: Positive for back pain and arthralgias.    There were no vitals taken for this visit.  Physical Exam Physical Exam  Constitutional:       Overweight female in NAD.  HENT:  Head: Normocephalic and atraumatic.  Neck: Neck supple.  Pulmonary/Chest: Effort normal and breath sounds normal.       Left chest wall scar and amastia.  Small right upper chest wall scar.    Data Reviewed Notes from Dr. Tenny Craw.  Hospital notes.  Assessment    Addison's disease and poor vascular access. She requires frequent infusions throughout the year. She does need long-term vascular access in my opinion as well as the opinion of Dr. Tenny Craw    Plan    Ultrasound-guided Port-A-Cath insertion.  The procedure risks and aftercare been explained. Risks include but are not limited to bleeding, infection, malfunction, pneumothorax, wound problems, DVT.       Rishika Mccollom J 10/24/2012, 4:17 PM

## 2012-10-30 NOTE — Patient Instructions (Signed)
No heavy activity with right arm for one week.  Keep bandages dry.  May remove bandages on 11/29 and then get area wet.  Call for heavy bleeding or wound problems.

## 2012-11-03 ENCOUNTER — Emergency Department (HOSPITAL_COMMUNITY)
Admission: EM | Admit: 2012-11-03 | Discharge: 2012-11-03 | Disposition: A | Discharge status: Home / Self Care | Payer: BC Managed Care – PPO | Attending: Emergency Medicine | Admitting: Emergency Medicine

## 2012-11-03 ENCOUNTER — Encounter (HOSPITAL_COMMUNITY): Payer: Self-pay | Admitting: *Deleted

## 2012-11-03 DIAGNOSIS — T82598A Other mechanical complication of other cardiac and vascular devices and implants, initial encounter: Secondary | ICD-10-CM | POA: Insufficient documentation

## 2012-11-03 DIAGNOSIS — Z79899 Other long term (current) drug therapy: Secondary | ICD-10-CM | POA: Insufficient documentation

## 2012-11-03 DIAGNOSIS — T829XXA Unspecified complication of cardiac and vascular prosthetic device, implant and graft, initial encounter: Secondary | ICD-10-CM

## 2012-11-03 DIAGNOSIS — J449 Chronic obstructive pulmonary disease, unspecified: Secondary | ICD-10-CM | POA: Insufficient documentation

## 2012-11-03 DIAGNOSIS — I10 Essential (primary) hypertension: Secondary | ICD-10-CM | POA: Insufficient documentation

## 2012-11-03 DIAGNOSIS — Z8619 Personal history of other infectious and parasitic diseases: Secondary | ICD-10-CM | POA: Insufficient documentation

## 2012-11-03 DIAGNOSIS — E119 Type 2 diabetes mellitus without complications: Secondary | ICD-10-CM | POA: Insufficient documentation

## 2012-11-03 DIAGNOSIS — Z8719 Personal history of other diseases of the digestive system: Secondary | ICD-10-CM | POA: Insufficient documentation

## 2012-11-03 DIAGNOSIS — Z8701 Personal history of pneumonia (recurrent): Secondary | ICD-10-CM | POA: Insufficient documentation

## 2012-11-03 DIAGNOSIS — J4489 Other specified chronic obstructive pulmonary disease: Secondary | ICD-10-CM | POA: Insufficient documentation

## 2012-11-03 DIAGNOSIS — Z862 Personal history of diseases of the blood and blood-forming organs and certain disorders involving the immune mechanism: Secondary | ICD-10-CM | POA: Insufficient documentation

## 2012-11-03 DIAGNOSIS — E039 Hypothyroidism, unspecified: Secondary | ICD-10-CM | POA: Insufficient documentation

## 2012-11-03 DIAGNOSIS — Z8659 Personal history of other mental and behavioral disorders: Secondary | ICD-10-CM | POA: Insufficient documentation

## 2012-11-03 DIAGNOSIS — Z853 Personal history of malignant neoplasm of breast: Secondary | ICD-10-CM | POA: Insufficient documentation

## 2012-11-03 DIAGNOSIS — Z8669 Personal history of other diseases of the nervous system and sense organs: Secondary | ICD-10-CM | POA: Insufficient documentation

## 2012-11-03 DIAGNOSIS — IMO0002 Reserved for concepts with insufficient information to code with codable children: Secondary | ICD-10-CM | POA: Insufficient documentation

## 2012-11-03 DIAGNOSIS — Z8679 Personal history of other diseases of the circulatory system: Secondary | ICD-10-CM | POA: Insufficient documentation

## 2012-11-03 DIAGNOSIS — Z8739 Personal history of other diseases of the musculoskeletal system and connective tissue: Secondary | ICD-10-CM | POA: Insufficient documentation

## 2012-11-03 DIAGNOSIS — G4733 Obstructive sleep apnea (adult) (pediatric): Secondary | ICD-10-CM | POA: Insufficient documentation

## 2012-11-03 DIAGNOSIS — Y849 Medical procedure, unspecified as the cause of abnormal reaction of the patient, or of later complication, without mention of misadventure at the time of the procedure: Secondary | ICD-10-CM | POA: Insufficient documentation

## 2012-11-03 DIAGNOSIS — J45909 Unspecified asthma, uncomplicated: Secondary | ICD-10-CM | POA: Insufficient documentation

## 2012-11-03 DIAGNOSIS — J42 Unspecified chronic bronchitis: Secondary | ICD-10-CM | POA: Insufficient documentation

## 2012-11-03 DIAGNOSIS — Z923 Personal history of irradiation: Secondary | ICD-10-CM | POA: Insufficient documentation

## 2012-11-03 DIAGNOSIS — K219 Gastro-esophageal reflux disease without esophagitis: Secondary | ICD-10-CM | POA: Insufficient documentation

## 2012-11-03 NOTE — ED Provider Notes (Signed)
History     CSN: [id number]  Arrival date & time 11/03/12  0155   First MD Initiated Contact with Patient 11/03/12 0303      Chief Complaint  Patient presents with  . Vascular Access Problem    (Consider location/radiation/quality/duration/timing/severity/associated sxs/prior treatment) HPI History provided by pt and prior chart.  Per prior chart, pt had Port-A-Cath insertion into right internal jugular vein on 10/30/12.  She has had mild pruritis at surgical site since then, but it was healing well.  Believes that she was scratching wound in her sleep tonight because when she woke, the overlying skin glue had been peeled off and there were a few drops of blood on her shirt.  She is very concerned that she could develop a infection.  Denies fever, pain and purulent drainage.  Otherwise feeling well.   Past Medical History  Diagnosis Date  . Asthma   . Hypertension   . Shingles   . Sleep apnea   . OSA on CPAP   . Adenocarcinoma of breast 07/13/1992  . Hypothyroidism   . H/O esophageal spasm     "I can have as many as 16 TUMS before they stop"  . Radiation adverse effect     "100% blockage on left; 50% on right salivary; can drain w/massage"  . Diabetes mellitus     "diet controlled"  . Restless leg syndrome, controlled 02/19/12    "much better since being on CPAP; been on that ~ 1 month now"  . COPD (chronic obstructive pulmonary disease)   . Pneumonia     "I've had it several times"  . Chronic bronchitis   . Positive TB test     "ran deep in my father's family; went on INH for awhile; it caused migraines"  . Adrenal insufficiency     "w/secondary Addison's"  . Anemia   . GERD (gastroesophageal reflux disease)   . H/O hiatal hernia   . Upper GI bleeding 1970-1980    "had 4"  . Hepatitis B infection 1971    "in high school; after drinking after football player"  . Migraines     "much better now" (02/19/12)  . Headache     "when I don't eat" (02/19/12)  . Anxiety   .  Arthritis   . Fibromyalgia   . Multiple sclerosis 05/1988    "dr's in [place] don't think I have it; dr's in [place] still do" (02/19/12)  . On home oxygen therapy     2 L at night  . Bruises easily   . Stress incontinence in female   . Addison's disease     Past Surgical History  Procedure Date  . Tonsillectomy and adenoidectomy 1964  . Mastectomy 2010    left  . Appendectomy 1960's  . Cholecystectomy 1980's  . Abdominal hysterectomy 1987  . Bilateral oophorectomy 1988  . Breast lumpectomy 07/13/1992    left  . Spinal cord stimulator insertion ~ 2004  . Back surgery ~ 2004    "chipped disk out to put in permanent stimulator"  . Band hemorrhoidectomy 1979  . Salivary gland surgery ~ 2002    "left was 100% blocked twice; right 50%; only had one surgery"  . Laparoscopy   . Portacath placement 10/30/2012    Procedure: INSERTION PORT-A-CATH;  Surgeon: Adolph Pollack, MD;  Location: [facility];  Service: General;  Laterality: Right;  Right Ultrasound guided port-a-cath insertion    History reviewed. No pertinent family history.  History  Substance  Use Topics  . Smoking status: Never Smoker   . Smokeless tobacco: Never Used  . Alcohol Use: No    OB History    Grav Para Term Preterm Abortions TAB SAB Ect Mult Living                  Review of Systems  All other systems reviewed and are negative.    Allergies  Compazine; Iohexol; Ivp dye; Lisinopril; Metformin and related; Metoclopramide hcl; Promethazine hcl; Sulfa antibiotics; Terbinafine hcl; Vancomycin; Guaifenesin; and Amlodipine  Home Medications   Current Outpatient Rx  Name  Route  Sig  Dispense  Refill  . B COMPLEX-C PO TABS   Oral   Take 1 tablet by mouth daily.         . BUDESONIDE-FORMOTEROL FUMARATE 160-4.5 MCG/ACT IN AERO   Inhalation   Inhale 2 puffs into the lungs 2 (two) times daily.          Marland Kitchen CALTRATE 600+D PO   Oral   Take 1 tablet by mouth daily.          Marland Kitchen CLONAZEPAM 1 MG PO  TABS   Oral   Take 1 mg by mouth 3 (three) times daily.          . CYANOCOBALAMIN 1000 MCG/ML IJ SOLN   Intramuscular   Inject 1,000 mcg into the muscle every 30 (thirty) days.         . CYCLOBENZAPRINE HCL 10 MG PO TABS   Oral   Take 10 mg by mouth 3 (three) times daily. Muscle spasms         . OMEGA-3 FATTY ACIDS 1000 MG PO CAPS   Oral   Take 1 g by mouth daily.          Marland Kitchen FLUOROURACIL 5 % EX CREA   Topical   Apply 1 application topically 2 (two) times daily.         . FUROSEMIDE 20 MG PO TABS   Oral   Take 20 mg by mouth daily as needed. For fluid         . CORTIZONE-10 ECZEMA EX   Apply externally   Apply 1 application topically 2 (two) times daily as needed. For eczema         . HYDROXYZINE HCL 50 MG PO TABS   Oral   Take 50 mg by mouth 4 (four) times daily as needed. itching         . LEVALBUTEROL TARTRATE 45 MCG/ACT IN AERO   Inhalation   Inhale 2 puffs into the lungs every 4 (four) hours as needed. Shortness of breath.         Marland Kitchen LEVOTHYROXINE SODIUM 75 MCG PO TABS   Oral   Take 75 mcg by mouth at bedtime.          Marland Kitchen LOSARTAN POTASSIUM-HCTZ 100-12.5 MG PO TABS   Oral   Take 1 tablet by mouth every morning.          Marland Kitchen MAGNESIUM CHLORIDE 64 MG PO TBEC   Oral   Take 1 tablet by mouth daily as needed. With lasix         . METOPROLOL TARTRATE 25 MG PO TABS   Oral   Take 25 mg by mouth 2 (two) times daily.         . MORPHINE SULFATE 30 MG PO TABS   Oral   Take 60 mg by mouth every 4 (four) hours as needed. Pain.         Marland Kitchen  MUPIROCIN 2 % EX OINT   Topical   Apply 1 application topically 2 (two) times daily.         Marland Kitchen OMEPRAZOLE 40 MG PO CPDR   Oral   Take 40 mg by mouth at bedtime.         Marland Kitchen ONDANSETRON HCL 4 MG PO TABS   Oral   Take 4 mg by mouth every 8 (eight) hours as needed. Nausea         . POLYETHYLENE GLYCOL 3350 PO PACK   Oral   Take 17 g by mouth daily as needed. For increased constipation         .  POTASSIUM CHLORIDE CRYS ER 20 MEQ PO TBCR   Oral   Take 20 mEq by mouth daily as needed. Take with lasix         . PREDNISONE 5 MG PO TABS   Oral   Take 5 mg by mouth daily.          . SENNA 8.6 MG PO TABS   Oral   Take 1 tablet (8.6 mg total) by mouth 2 (two) times daily.   120 each        BP 143/69  Pulse 76  Temp 98.6 F (37 C) (Oral)  Resp 16  Ht 5\' 2"  (1.575 m)  Wt 190 lb (86.183 kg)  BMI 34.75 kg/m2  SpO2 94%  Physical Exam  Nursing note and vitals reviewed. Constitutional: She is oriented to person, place, and time. She appears well-developed and well-nourished. No distress.  HENT:  Head: Normocephalic and atraumatic.  Eyes:       Normal appearance  Neck: Normal range of motion.  Cardiovascular: Normal rate and regular rhythm.   Pulmonary/Chest: Effort normal and breath sounds normal. No respiratory distress.       3cm horizontal, well-healing surgical incision right anterior chest. Lateral end w/ 2-77mm superficial opening.  Hemostatic and appears clean.  Mild tenderness over port.   Musculoskeletal: Normal range of motion.  Neurological: She is alert and oriented to person, place, and time.  Skin: Skin is warm and dry. No rash noted.  Psychiatric: She has a normal mood and affect. Her behavior is normal.    ED Course  Procedures (including critical care time)  Labs Reviewed - No data to display No results found.   1. Vascular port complication       MDM  Pt had a port-a-cath insertion 10/30/12, believes she scratched the surgical site in her sleep, and wound has opened.  On exam, there is a 2-69mm hemostatic, superficial opening in wound.  Pt appears very anxious d/t concern that port will become infected.  I cleaned area w/ extensive NS lavage in sterile fashion and then applied steri-strips and bandage.  Pt has f/u scheduled w/ her general surgeon and signs of infection discussed.       Otilio Miu, PA-C 11/04/12 1236

## 2012-11-03 NOTE — ED Notes (Signed)
Pt verbalizes understanding 

## 2012-11-03 NOTE — ED Notes (Signed)
Pt states she accidentally picked at port a cath site in her sleep tonight and it is now oozing discharge

## 2012-11-04 ENCOUNTER — Telehealth: Payer: Self-pay

## 2012-11-04 ENCOUNTER — Other Ambulatory Visit: Payer: Self-pay | Admitting: Lab

## 2012-11-04 ENCOUNTER — Encounter: Payer: Self-pay | Admitting: Genetic Counselor

## 2012-11-04 ENCOUNTER — Telehealth: Payer: Self-pay | Admitting: *Deleted

## 2012-11-04 NOTE — Telephone Encounter (Signed)
Ms. Saling called and stated that she just got out of the hospital with a bad attack of her Addison's Disease.  She has a slight temp and diarrhea and cannot keep appt. today.  Gave information to Ms. Powell.  Told Ms. Warshawsky that she can call Ms. Powell when she is feeling better and schedule an appt. in December since patient met her deductible for 2013.  Gave Ms. Sisler direct phone number to Ms. Powell at her request.  Phone [phone].

## 2012-11-04 NOTE — Telephone Encounter (Signed)
Patient called reporting she is feeling poorly.  "This just hit me today.  Recently diagnosed with addison's disease and had first crisis.  Today my face is flushed, I have a rash to my neck, I've had diarrhea twice and vomited twice.  I am to see genetic counselor at 1:30 pm and don't know how to proceed."   Message left on voicemail of Maylon Cos and call transferred to collaborative at patient's request.  Reports last night temp = 101.0, she took tylenol and today temperature has remained 100.1.

## 2012-11-05 ENCOUNTER — Telehealth (INDEPENDENT_AMBULATORY_CARE_PROVIDER_SITE_OTHER): Payer: Self-pay | Admitting: General Surgery

## 2012-11-05 NOTE — Telephone Encounter (Signed)
Patient called to let us know that in her sleep she has pulled off the adhesive over her PAC incision.  She went to the hospital and they explained that it has only opened a tiny amount.  They put steri-strips on it and explained to her that she should clean it with sterile saline.  I explained to her that those instructions were fine and that she should call us if for some reason it seems to get worse.  Also explained that she should not take the steri-strips off, but that they will fall off on their own in a couple of weeks.  She said she understood.

## 2012-11-05 NOTE — ED Provider Notes (Signed)
Medical screening examination/treatment/procedure(s) were performed by non-physician practitioner and as supervising physician I was immediately available for consultation/collaboration.  Keeya Dyckman M Goebel Hellums, MD 11/05/12 0939 

## 2012-11-19 ENCOUNTER — Ambulatory Visit (HOSPITAL_BASED_OUTPATIENT_CLINIC_OR_DEPARTMENT_OTHER): Payer: BC Managed Care – PPO | Admitting: Genetic Counselor

## 2012-11-19 ENCOUNTER — Encounter: Payer: Self-pay | Admitting: Genetic Counselor

## 2012-11-19 ENCOUNTER — Ambulatory Visit: Payer: BC Managed Care – PPO | Admitting: Lab

## 2012-11-19 ENCOUNTER — Ambulatory Visit (HOSPITAL_BASED_OUTPATIENT_CLINIC_OR_DEPARTMENT_OTHER): Payer: BC Managed Care – PPO

## 2012-11-19 VITALS — BP 100/63 | HR 75 | Temp 98.5°F

## 2012-11-19 DIAGNOSIS — D0510 Intraductal carcinoma in situ of unspecified breast: Secondary | ICD-10-CM

## 2012-11-19 DIAGNOSIS — D0590 Unspecified type of carcinoma in situ of unspecified breast: Secondary | ICD-10-CM

## 2012-11-19 DIAGNOSIS — IMO0002 Reserved for concepts with insufficient information to code with codable children: Secondary | ICD-10-CM

## 2012-11-19 MED ORDER — SODIUM CHLORIDE 0.9 % IJ SOLN
10.0000 mL | INTRAMUSCULAR | Status: DC | PRN
  Administered 2012-11-19: 10 mL via INTRAVENOUS
  Filled 2012-11-19: qty 10

## 2012-11-19 MED ORDER — HEPARIN SOD (PORK) LOCK FLUSH 100 UNIT/ML IV SOLN
500.0000 [IU] | Freq: Once | INTRAVENOUS | Status: AC
  Administered 2012-11-19: 500 [IU] via INTRAVENOUS
  Filled 2012-11-19: qty 5

## 2012-11-20 ENCOUNTER — Encounter: Payer: Self-pay | Admitting: Genetic Counselor

## 2012-11-20 NOTE — Progress Notes (Signed)
Dr.  Jama Flavors requested a consultation for genetic counseling and risk assessment for Daisy [name], a 61 y.o. female, for discussion of her personal history of breast cancer and family history of breast and throat cancer. She presents to clinic today to discuss the possibility of a genetic predisposition to cancer, and to further clarify her risks, as well as her family members' risks for cancer.   HISTORY OF PRESENT ILLNESS: In 1993 and again in 2010, at the ages of 66 and 82, Daisy [name] was diagnosed with breast cancer.    Past Medical History  Diagnosis Date  . Asthma   . Hypertension   . Shingles   . Sleep apnea   . OSA on CPAP   . Adenocarcinoma of breast 07/13/1992  . Hypothyroidism   . H/O esophageal spasm     "I can have as many as 56 TUMS before they stop"  . Radiation adverse effect     "100% blockage on left; 50% on right salivary; can drain w/massage"  . Diabetes mellitus     "diet controlled"  . Restless leg syndrome, controlled 02/19/12    "much better since being on CPAP; been on that ~ 1 month now"  . COPD (chronic obstructive pulmonary disease)   . Pneumonia     "I've had it several times"  . Chronic bronchitis   . Positive TB test     "ran deep in my father's family; went on INH for awhile; it caused migraines"  . Adrenal insufficiency     "w/secondary Addison's"  . Anemia   . GERD (gastroesophageal reflux disease)   . H/O hiatal hernia   . Upper GI bleeding 1970-1980    "had 4"  . Hepatitis B infection 1971    "in high school; after drinking after football player"  . Migraines     "much better now" (02/19/12)  . Headache     "when I don't eat" (02/19/12)  . Anxiety   . Arthritis   . Fibromyalgia   . Multiple sclerosis 05/1988    "dr's in [place] don't think I have it; dr's in [place] still do" (02/19/12)  . On home oxygen therapy     2 L at night  . Bruises easily   . Stress incontinence in female   . Addison's disease     Past  Surgical History  Procedure Date  . Tonsillectomy and adenoidectomy 1964  . Mastectomy 2010    left  . Appendectomy 1960's  . Cholecystectomy 1980's  . Abdominal hysterectomy 1987  . Bilateral oophorectomy 1988  . Breast lumpectomy 07/13/1992    left  . Spinal cord stimulator insertion ~ 2004  . Back surgery ~ 2004    "chipped disk out to put in permanent stimulator"  . Band hemorrhoidectomy 1979  . Salivary gland surgery ~ 2002    "left was 100% blocked twice; right 50%; only had one surgery"  . Laparoscopy   . Portacath placement 10/30/2012    Procedure: INSERTION PORT-A-CATH;  Surgeon: Adolph Pollack, MD;  Location: [facility];  Service: General;  Laterality: Right;  Right Ultrasound guided port-a-cath insertion    History  Substance Use Topics  . Smoking status: Never Smoker   . Smokeless tobacco: Never Used  . Alcohol Use: No    REPRODUCTIVE HISTORY AND PERSONAL RISK ASSESSMENT FACTORS: Menarche was at age 75.   Menopause at age 39 b/c of hysterectomy Uterus Intact: No Ovaries Intact: No G3P2A1 , first  live birth at age 68  She has not previously undergone treatment for infertility.   OCP use for 1 year   She has used HRT in the past.    FAMILY HISTORY:  We obtained a detailed, 4-generation family history.  Significant diagnoses are listed below: Family History  Problem Relation Age of Onset  . Lung cancer Paternal Uncle     smoker  . Breast cancer Cousin 40    maternal cousin  . Throat cancer Cousin     maternal cousin  The patient was diagnosed with breast cancer at age 38 and then again at age 3.  The latter cancer was pagets disease.  She has one full and one maternal half sister.  The patient's parents are both deceased, but neither had cancer.  She has 13 maternal aunts and uncles none of whom had cancer.  She has one maternal cousin with breast cancer at age 21 and a maternal cousin with throat cancer thought to be due to heavy alcohol use.  The patient's  father ahd 12 brothers and sisters.  One uncle was a smoker and died of lung cancer.  There are no other reported cancers on either side of the family.  Patient's maternal ancestors are of unknown descent, and paternal ancestors are of [place] descent. There is no reported Ashkenazi Jewish ancestry. There is no known consanguinity.  GENETIC COUNSELING RISK ASSESSMENT, DISCUSSION, AND SUGGESTED FOLLOW UP: We reviewed the natural history and genetic etiology of sporadic, familial and hereditary cancer syndromes.  About 5-10% of breast cancer is hereditary.  Of this, about 85% is the result of a BRCA1 or BRCA2 mutation.  We reviewed the red flags of hereditary cancer syndromes and the dominant inheritance patterns. The patient comes from a large family.  With the lack of cancer within the family, she has a low probability of testing positive for a BRCA mutation, and even lower chance of testing positive for a moderate or newer hereditary cancer gene.  The patient opted for BRCA testing only.  The patient's personal history of breast cancer is suggestive of the following possible diagnosis: BRCA mutation vs. Sporadic disease.  We discussed that identification of a hereditary cancer syndrome may help her care providers tailor the patients medical management. If a mutation indicating a hereditary cancer syndrome is detected in this case, the [employer] recommendations would include increased cancer surveillance and possible prophylactic surgery. If a mutation is detected, the patient will be referred back to the referring provider and to any additional appropriate care providers to discuss the relevant options.   If a mutation is not found in the patient, this will decrease the likelihood of a hereditary cancer syndroem as the explanation for her breast cancer. Cancer surveillance options would be discussed for the patient according to the appropriate standard National Comprehensive  Cancer Network and American Cancer Society guidelines, with consideration of their personal and family history risk factors. In this case, the patient will be referred back to their care providers for discussions of management.   In order to estimate her chance of having a BRCA mutation, we used statistical models (BRCAPro and Penn II) and laboratory data that take into account her personal medical history, family history and ancestry.  Because each model is different, there can be a lot of variability in the risks they give.  Therefore, these numbers must be considered a rough range and not a precise risk of having a BRCA mutation.  These models  estimate that she has approximately a 0.03-11% chance of having a mutation. Based on this assessment of her family and personal history, genetic testing is recommended.  We discussed that the Penn II estimate of 11% is probably high as it does not take into account the number of family members in her family.  After considering the risks, benefits, and limitations, the patient provided informed consent for  the following  testing: BRACAnalysis through [employer].   Per the patient's request, we will contact her by telephone to discuss these results. A follow up genetic counseling visit will be scheduled if indicated.  The patient was seen for a total of 60 minutes, greater than 50% of which was spent face-to-face counseling.  This plan is being carried out per Dr. Jama Flavors recommendations.  This note will also be sent to the referring provider via the electronic medical record. The patient will be supplied with a summary of this genetic counseling discussion as well as educational information on the discussed hereditary cancer syndromes following the conclusion of their visit.   Patient was discussed with Dr. Drue Second.    _______________________________________________________________________ For Office Staff:  Number of people involved in  session: 2 Was an Intern/ student involved with case: no

## 2012-12-02 ENCOUNTER — Telehealth: Payer: Self-pay | Admitting: Genetic Counselor

## 2012-12-02 NOTE — Telephone Encounter (Signed)
Revealed negative BRCA test results 

## 2012-12-05 ENCOUNTER — Encounter: Payer: Self-pay | Admitting: Genetic Counselor

## 2012-12-06 ENCOUNTER — Encounter: Payer: Self-pay | Admitting: Genetic Counselor

## 2012-12-09 ENCOUNTER — Telehealth (INDEPENDENT_AMBULATORY_CARE_PROVIDER_SITE_OTHER): Payer: Self-pay

## 2012-12-09 ENCOUNTER — Telehealth: Payer: Self-pay | Admitting: *Deleted

## 2012-12-09 NOTE — Telephone Encounter (Signed)
The pt called regarding her port a cath flushes.  She said Dr Abbey Chatters put it in because she was a difficult stick.  She is going to the cancer center next month for her yearly check up and they can do it there.  She wants to know the best way to handle it.  She has a new Forensic scientist.  She is not sure what they cover.  She wants to know if it would be better to have [facility] to come out and flush her port or to have it done at the cancer center.  She is not sure about the price and is not able to get through to the billing dept at the [facility].

## 2012-12-09 NOTE — Telephone Encounter (Addendum)
Pt left message wanting to know if it will be cheaper to have port flushed at [facility] or have HH come to house to flush. Spoke with patient, she is going to discuss with financial counselors

## 2012-12-12 ENCOUNTER — Telehealth (INDEPENDENT_AMBULATORY_CARE_PROVIDER_SITE_OTHER): Payer: Self-pay

## 2012-12-12 NOTE — Telephone Encounter (Signed)
Discussed scheduling her PAC flushes.  She will have first flush at her appt on 01/07/13, and the Cancer Center will continue to take care of her PAC every six/eight weeks.

## 2013-01-07 ENCOUNTER — Ambulatory Visit (HOSPITAL_BASED_OUTPATIENT_CLINIC_OR_DEPARTMENT_OTHER): Payer: BC Managed Care – PPO | Admitting: Oncology

## 2013-01-07 ENCOUNTER — Telehealth: Payer: Self-pay | Admitting: Oncology

## 2013-01-07 ENCOUNTER — Encounter: Payer: Self-pay | Admitting: Oncology

## 2013-01-07 ENCOUNTER — Other Ambulatory Visit (HOSPITAL_BASED_OUTPATIENT_CLINIC_OR_DEPARTMENT_OTHER): Payer: BC Managed Care – PPO | Admitting: Lab

## 2013-01-07 ENCOUNTER — Ambulatory Visit: Payer: BC Managed Care – PPO

## 2013-01-07 VITALS — Wt 204.4 lb

## 2013-01-07 VITALS — BP 162/90 | HR 85 | Temp 98.0°F

## 2013-01-07 DIAGNOSIS — Z853 Personal history of malignant neoplasm of breast: Secondary | ICD-10-CM

## 2013-01-07 DIAGNOSIS — Z1231 Encounter for screening mammogram for malignant neoplasm of breast: Secondary | ICD-10-CM

## 2013-01-07 DIAGNOSIS — C50912 Malignant neoplasm of unspecified site of left female breast: Secondary | ICD-10-CM

## 2013-01-07 DIAGNOSIS — D0510 Intraductal carcinoma in situ of unspecified breast: Secondary | ICD-10-CM

## 2013-01-07 DIAGNOSIS — D509 Iron deficiency anemia, unspecified: Secondary | ICD-10-CM

## 2013-01-07 DIAGNOSIS — Z87898 Personal history of other specified conditions: Secondary | ICD-10-CM

## 2013-01-07 LAB — CBC WITH DIFFERENTIAL/PLATELET
Basophils Absolute: 0.1 10*3/uL (ref 0.0–0.1)
EOS%: 0.9 % (ref 0.0–7.0)
Eosinophils Absolute: 0.1 10*3/uL (ref 0.0–0.5)
HGB: 13.3 g/dL (ref 11.6–15.9)
LYMPH%: 20.1 % (ref 14.0–49.7)
MCH: 27.1 pg (ref 25.1–34.0)
MCV: 83.2 fL (ref 79.5–101.0)
MONO%: 6.7 % (ref 0.0–14.0)
NEUT#: 5.4 10*3/uL (ref 1.5–6.5)
NEUT%: 71.5 % (ref 38.4–76.8)
Platelets: 194 10*3/uL (ref 145–400)
RDW: 13.3 % (ref 11.2–14.5)

## 2013-01-07 LAB — IRON AND TIBC: UIBC: 135 ug/dL (ref 125–400)

## 2013-01-07 LAB — FERRITIN: Ferritin: 52 ng/mL (ref 10–291)

## 2013-01-07 MED ORDER — SODIUM CHLORIDE 0.9 % IJ SOLN
10.0000 mL | INTRAMUSCULAR | Status: DC | PRN
  Administered 2013-01-07: 10 mL via INTRAVENOUS
  Filled 2013-01-07: qty 10

## 2013-01-07 MED ORDER — HEPARIN SOD (PORK) LOCK FLUSH 100 UNIT/ML IV SOLN
500.0000 [IU] | Freq: Once | INTRAVENOUS | Status: AC
  Administered 2013-01-07: 500 [IU] via INTRAVENOUS
  Filled 2013-01-07: qty 5

## 2013-01-07 NOTE — Progress Notes (Signed)
OFFICE PROGRESS NOTE   01/07/2013   Physicians:C.Duane Lope, T.Rosenbower, J.Hayes, Nathen May [facility])   INTERVAL HISTORY:   Patient is seen, alone for visit, in scheduled follow up of her history of left breast cancer, on observation thru this office. 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 She additionally has history of T1N0 ER/PR positive left breast cancer in 1993, treated with lumpectomy and axillary node evaluation and adjuvant adriamycin/ cytoxan by Dr.Neijstrom, local radiation, then unable to tolerate tamoxifen due to migraines. Most recent mammograms were at [facility] 12-29-2011, without dense tissue or other mammographic findings of concern. Patient had portacath placed by Dr Abbey Chatters during admission in Nov 2013 for Addison's crisis,  being flushed at this office every 6-8 weeks when not otherwise used.   Patient continues dealing with multiple medical problems. She has endocrine appointment at [facility] upcoming. She has had no recent symptoms of infection. She is not aware of any changes in right breast or left mastectomy area. She has had some recent difficulty swallowing rough solid food, may want to discuss with Dr Madilyn Fireman. She denies increased shortness of breath. Weight is up since high dose steroids with the Addison's crisis, abdomen feels full unchanged. Bowels are moving and she is not vomiting. Short term memory is poor and history is not easy. Remainder of 10 point Review of Systems negative.  Objective:  Vital signs in last 24 hours:  Wt 204 lb 6.4 oz (92.715 kg), up 4 lbs from July 2013. BP 162/90, HR 85 regular, respirations not labored RA. Temp 98 Ambulatory slowly but without assistance. Alert, speech fluent, has difficulty staying on track in conversation.   HEENT:PERRLA and oropharynx clear, no  lesions LymphaticsCervical, supraclavicular, and axillary nodes normal. Resp: clear to auscultation bilaterally Cardio: regular rate and rhythm GI: obese, soft, nothing palpable, not tender, some bowel sounds Extremities: no pitting edema, cords, tenderness Neuro:no obvious focal deficits Breast: right without dominant mass, skin or nipple findings. Left mastectomy scar without findings of concern for local recurrence Portacath-without erythema or tenderness, flushed today.  Lab Results:  Results for orders placed in visit on 01/07/13  CBC WITH DIFFERENTIAL      Component Value Range   WBC 7.6  3.9 - 10.3 10e3/uL   NEUT# 5.4  1.5 - 6.5 10e3/uL   HGB 13.3  11.6 - 15.9 g/dL   HCT 16.1  09.6 - 04.5 %   Platelets 194  145 - 400 10e3/uL   MCV 83.2  79.5 - 101.0 fL   MCH 27.1  25.1 - 34.0 pg   MCHC 32.6  31.5 - 36.0 g/dL   RBC 4.09  8.11 - 9.14 10e6/uL   RDW 13.3  11.2 - 14.5 %   lymph# 1.5  0.9 - 3.3 10e3/uL   MONO# 0.5  0.1 - 0.9 10e3/uL   Eosinophils Absolute 0.1  0.0 - 0.5 10e3/uL   Basophils Absolute 0.1  0.0 - 0.1 10e3/uL   NEUT% 71.5  38.4 - 76.8 %   LYMPH% 20.1  14.0 - 49.7 %   MONO% 6.7  0.0 - 14.0 %   EOS% 0.9  0.0 - 7.0 %   BASO% 0.8  0.0 - 2.0 %    Iron studies available after visit with serum iron now 180 and % sat 57, ferritin 52 Studies/Results: Right mammogram digital screening ordered to be done  at [facility] upcoming  Medications: I have reviewed the patient's current medications. She is not presently on oral iron per EMR.  Assessment/Plan: 1. Iron deficiency anemia: resolved 2. 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 Previously intolerant to tamoxifen.  3.History of T1N0 ER/PR + left breast cancer 1993, not known recurrent. She is due mammograms at [facility].  4.morbid obesity: has lost some weight in past year even given steroids and limited exercise tolerance, encouraged again now. 5.hypothyroidism on replacement  6. GERD   7.chronic pain, followed by pain clinic 8.PAC in due to extremely difficult IV access during hospitalization in Nov. Flush q 8 wks. I will see her in ~ 8 months, coordinating with PAC flush        Korayma Hagwood P, MD   01/07/2013, 1:36 PM

## 2013-01-07 NOTE — Patient Instructions (Signed)
portacath should be flushed every 6-8 weeks

## 2013-01-07 NOTE — Telephone Encounter (Signed)
gv and printed pt appt schdule from Feb thru OCT...schedule pt for Feb 12th @ [facility] for [facility] @ 10:45am

## 2013-01-29 ENCOUNTER — Telehealth: Payer: Self-pay

## 2013-01-29 NOTE — Telephone Encounter (Signed)
Daisy [name] called and stated that she had to reschedule her mammogram at [facility] from 01-15-13 to 01-31-13 due to inclement weather.

## 2013-02-25 ENCOUNTER — Ambulatory Visit (HOSPITAL_BASED_OUTPATIENT_CLINIC_OR_DEPARTMENT_OTHER): Payer: Self-pay

## 2013-02-25 VITALS — BP 138/69 | HR 85 | Temp 98.3°F | Resp 20

## 2013-02-25 DIAGNOSIS — Z853 Personal history of malignant neoplasm of breast: Secondary | ICD-10-CM

## 2013-02-25 DIAGNOSIS — D0512 Intraductal carcinoma in situ of left breast: Secondary | ICD-10-CM

## 2013-02-25 DIAGNOSIS — Z452 Encounter for adjustment and management of vascular access device: Secondary | ICD-10-CM

## 2013-02-25 MED ORDER — SODIUM CHLORIDE 0.9 % IJ SOLN
10.0000 mL | INTRAMUSCULAR | Status: DC | PRN
  Administered 2013-02-25: 10 mL via INTRAVENOUS
  Filled 2013-02-25: qty 10

## 2013-02-25 MED ORDER — HEPARIN SOD (PORK) LOCK FLUSH 100 UNIT/ML IV SOLN
500.0000 [IU] | Freq: Once | INTRAVENOUS | Status: AC
  Administered 2013-02-25: 500 [IU] via INTRAVENOUS
  Filled 2013-02-25: qty 5

## 2013-03-11 ENCOUNTER — Telehealth: Payer: Self-pay | Admitting: *Deleted

## 2013-03-11 NOTE — Telephone Encounter (Signed)
PT. COULD NOT REMEMBER WHEN HER LAST PAC FLUSH WAS DONE. INFORMED PT. THE PAC WAS FLUSHED ON 02/25/13. HER NEXT SCHEDULED FLUSH APPOINTMENT IS 04/01/13. PT. WAS HAVING A DIFFICULT TIME READING THE CALENDAR FOR HER APPOINTMENTS. INSTRUCTED PT. TO KEEP THE 04/01/13 FLUSH APPOINTMENT AND THE SCHEDULER WILL HELP WITH HER OTHER APPOINTMENTS. SHE VOICES UNDERSTANDING.

## 2013-04-01 ENCOUNTER — Ambulatory Visit (HOSPITAL_BASED_OUTPATIENT_CLINIC_OR_DEPARTMENT_OTHER): Payer: No Typology Code available for payment source

## 2013-04-01 DIAGNOSIS — D0590 Unspecified type of carcinoma in situ of unspecified breast: Secondary | ICD-10-CM

## 2013-04-01 MED ORDER — SODIUM CHLORIDE 0.9 % IJ SOLN
10.0000 mL | INTRAMUSCULAR | Status: DC | PRN
  Administered 2013-04-01: 10 mL via INTRAVENOUS
  Filled 2013-04-01: qty 10

## 2013-04-01 MED ORDER — HEPARIN SOD (PORK) LOCK FLUSH 100 UNIT/ML IV SOLN
500.0000 [IU] | Freq: Once | INTRAVENOUS | Status: AC
  Administered 2013-04-01: 500 [IU] via INTRAVENOUS
  Filled 2013-04-01: qty 5

## 2013-04-01 NOTE — Patient Instructions (Signed)
Call MD for problems 

## 2013-04-08 ENCOUNTER — Other Ambulatory Visit: Payer: Self-pay

## 2013-04-08 DIAGNOSIS — D0510 Intraductal carcinoma in situ of unspecified breast: Secondary | ICD-10-CM

## 2013-04-08 MED ORDER — LIDOCAINE-PRILOCAINE 2.5-2.5 % EX CREA: TOPICAL_CREAM | CUTANEOUS | Status: DC

## 2013-04-08 NOTE — Progress Notes (Signed)
Called Daisy [name] and informed her that EMLA Cream prescription was sent to her pharmacy.

## 2013-04-11 ENCOUNTER — Other Ambulatory Visit (HOSPITAL_COMMUNITY)
Admission: RE | Admit: 2013-04-11 | Discharge: 2013-04-11 | Disposition: A | Discharge status: Home / Self Care | Payer: No Typology Code available for payment source | Source: Ambulatory Visit | Attending: Gastroenterology | Admitting: Gastroenterology

## 2013-04-11 ENCOUNTER — Other Ambulatory Visit: Payer: Self-pay | Admitting: Gastroenterology

## 2013-04-11 DIAGNOSIS — B379 Candidiasis, unspecified: Secondary | ICD-10-CM | POA: Insufficient documentation

## 2013-05-13 ENCOUNTER — Ambulatory Visit (HOSPITAL_BASED_OUTPATIENT_CLINIC_OR_DEPARTMENT_OTHER): Payer: No Typology Code available for payment source

## 2013-05-13 VITALS — BP 142/79 | HR 83 | Temp 98.3°F

## 2013-05-13 DIAGNOSIS — D0590 Unspecified type of carcinoma in situ of unspecified breast: Secondary | ICD-10-CM

## 2013-05-13 DIAGNOSIS — D0510 Intraductal carcinoma in situ of unspecified breast: Secondary | ICD-10-CM

## 2013-05-13 MED ORDER — SODIUM CHLORIDE 0.9 % IJ SOLN
10.0000 mL | INTRAMUSCULAR | Status: DC | PRN
  Administered 2013-05-13: 10 mL via INTRAVENOUS
  Filled 2013-05-13: qty 10

## 2013-05-13 MED ORDER — HEPARIN SOD (PORK) LOCK FLUSH 100 UNIT/ML IV SOLN
500.0000 [IU] | Freq: Once | INTRAVENOUS | Status: AC
  Administered 2013-05-13: 500 [IU] via INTRAVENOUS
  Filled 2013-05-13: qty 5

## 2013-05-13 NOTE — Patient Instructions (Addendum)
Implanted Port Instructions  An implanted port is a central line that has a round shape and is placed under the skin. It is used for long-term IV (intravenous) access for:  · Medicine.  · Fluids.  · Liquid nutrition, such as TPN (total parenteral nutrition).  · Blood samples.  Ports can be placed:  · In the chest area just below the collarbone (this is the most common place.)  · In the arms.  · In the belly (abdomen) area.  · In the legs.  PARTS OF THE PORT  A port has 2 main parts:  · The reservoir. The reservoir is round, disc-shaped, and will be a small, raised area under your skin.  · The reservoir is the part where a needle is inserted (accessed) to either give medicines or to draw blood.  · The catheter. The catheter is a long, slender tube that extends from the reservoir. The catheter is placed into a large vein.  · Medicine that is inserted into the reservoir goes into the catheter and then into the vein.  INSERTION OF THE PORT  · The port is surgically placed in either an operating room or in a procedural area (interventional radiology).  · Medicine may be given to help you relax during the procedure.  · The skin where the port will be inserted is numbed (local anesthetic).  · 1 or 2 small cuts (incisions) will be made in the skin to insert the port.  · The port can be used after it has been inserted.  INCISION SITE CARE  · The incision site may have small adhesive strips on it. This helps keep the incision site closed. Sometimes, no adhesive strips are placed. Instead of adhesive strips, a special kind of surgical glue is used to keep the incision closed.  · If adhesive strips were placed on the incision sites, do not take them off. They will fall off on their own.  · The incision site may be sore for 1 to 2 days. Pain medicine can help.  · Do not get the incision site wet. Bathe or shower as directed by your caregiver.  · The incision site should heal in 5 to 7 days. A small scar may form after the  incision has healed.  ACCESSING THE PORT  Special steps must be taken to access the port:  · Before the port is accessed, a numbing cream can be placed on the skin. This helps numb the skin over the port site.  · A sterile technique is used to access the port.  · The port is accessed with a needle. Only "non-coring" port needles should be used to access the port. Once the port is accessed, a blood return should be checked. This helps ensure the port is in the vein and is not clogged (clotted).  · If your caregiver believes your port should remain accessed, a clear (transparent) bandage will be placed over the needle site. The bandage and needle will need to be changed every week or as directed by your caregiver.  · Keep the bandage covering the needle clean and dry. Do not get it wet. Follow your caregiver's instructions on how to take a shower or bath when the port is accessed.  · If your port does not need to stay accessed, no bandage is needed over the port.  FLUSHING THE PORT  Flushing the port keeps it from getting clogged. How often the port is flushed depends on:  · If a   constant infusion is running. If a constant infusion is running, the port may not need to be flushed.  · If intermittent medicines are given.  · If the port is not being used.  For intermittent medicines:  · The port will need to be flushed:  · After medicines have been given.  · After blood has been drawn.  · As part of routine maintenance.  · A port is normally flushed with:  · Normal saline.  · Heparin.  · Follow your caregiver's advice on how often, how much, and the type of flush to use on your port.  IMPORTANT PORT INFORMATION  · Tell your caregiver if you are allergic to heparin.  · After your port is placed, you will get a manufacturer's information card. The card has information about your port. Keep this card with you at all times.  · There are many types of ports available. Know what kind of port you have.  · In case of an  emergency, it may be helpful to wear a medical alert bracelet. This can help alert health care workers that you have a port.  · The port can stay in for as long as your caregiver believes it is necessary.  · When it is time for the port to come out, surgery will be done to remove it. The surgery will be similar to how the port was put in.  · If you are in the hospital or clinic:  · Your port will be taken care of and flushed by a nurse.  · If you are at home:  · A home health care nurse may give medicines and take care of the port.  · You or a family member can get special training and directions for giving medicine and taking care of the port at home.  SEEK IMMEDIATE MEDICAL CARE IF:   · Your port does not flush or you are unable to get a blood return.  · New drainage or pus is coming from the incision.  · A bad smell is coming from the incision site.  · You develop swelling or increased redness at the incision site.  · You develop increased swelling or pain at the port site.  · You develop swelling or pain in the surrounding skin near the port.  · You have an oral temperature above 102° F (38.9° C), not controlled by medicine.  MAKE SURE YOU:   · Understand these instructions.  · Will watch your condition.  · Will get help right away if you are not doing well or get worse.  Document Released: 11/20/2005 Document Revised: 02/12/2012 Document Reviewed: 02/11/2009  ExitCare® Patient Information ©2014 [employer].

## 2013-05-20 ENCOUNTER — Telehealth: Payer: Self-pay | Admitting: *Deleted

## 2013-05-20 NOTE — Telephone Encounter (Signed)
Pt called to say she is having mammogram on Friday at [facility]. Needs Dr Precious Reel office to call to change the order so she can get the results on Friday while she is there. RN will call [facility] to clarify.

## 2013-05-20 NOTE — Telephone Encounter (Signed)
Faxed rx for "diagnostic mammogram right breast" to [facility]

## 2013-06-16 ENCOUNTER — Encounter: Payer: Self-pay | Admitting: Oncology

## 2013-06-17 ENCOUNTER — Encounter (INDEPENDENT_AMBULATORY_CARE_PROVIDER_SITE_OTHER): Payer: Self-pay

## 2013-06-18 ENCOUNTER — Telehealth: Payer: Self-pay | Admitting: Hematology and Oncology

## 2013-06-18 NOTE — Telephone Encounter (Signed)
S/W PT IN RE TO APPT CHANGE TO 07/23 @ 2:45 LAB, 3 MD, 3:45 FLUSH. PT CONFIRMED APPT.

## 2013-06-20 ENCOUNTER — Ambulatory Visit: Payer: Self-pay

## 2013-06-20 ENCOUNTER — Other Ambulatory Visit: Payer: Self-pay | Admitting: Lab

## 2013-06-25 ENCOUNTER — Other Ambulatory Visit: Payer: Self-pay | Admitting: Lab

## 2013-06-25 ENCOUNTER — Ambulatory Visit: Payer: Self-pay

## 2013-06-26 ENCOUNTER — Telehealth: Payer: Self-pay | Admitting: *Deleted

## 2013-06-26 NOTE — Telephone Encounter (Signed)
Patient called and moved her flush appt to next week.   JMW

## 2013-07-03 ENCOUNTER — Ambulatory Visit (HOSPITAL_BASED_OUTPATIENT_CLINIC_OR_DEPARTMENT_OTHER): Payer: No Typology Code available for payment source

## 2013-07-03 VITALS — BP 134/88 | HR 91 | Temp 98.6°F

## 2013-07-03 DIAGNOSIS — Z452 Encounter for adjustment and management of vascular access device: Secondary | ICD-10-CM

## 2013-07-03 DIAGNOSIS — D0510 Intraductal carcinoma in situ of unspecified breast: Secondary | ICD-10-CM

## 2013-07-03 DIAGNOSIS — D0590 Unspecified type of carcinoma in situ of unspecified breast: Secondary | ICD-10-CM

## 2013-07-03 MED ORDER — SODIUM CHLORIDE 0.9 % IJ SOLN
10.0000 mL | INTRAMUSCULAR | Status: DC | PRN
  Administered 2013-07-03: 10 mL via INTRAVENOUS
  Filled 2013-07-03: qty 10

## 2013-07-03 MED ORDER — HEPARIN SOD (PORK) LOCK FLUSH 100 UNIT/ML IV SOLN
500.0000 [IU] | Freq: Once | INTRAVENOUS | Status: AC
  Administered 2013-07-03: 500 [IU] via INTRAVENOUS
  Filled 2013-07-03: qty 5

## 2013-07-03 NOTE — Patient Instructions (Signed)
Implanted Port Instructions  An implanted port is a central line that has a round shape and is placed under the skin. It is used for long-term IV (intravenous) access for:  · Medicine.  · Fluids.  · Liquid nutrition, such as TPN (total parenteral nutrition).  · Blood samples.  Ports can be placed:  · In the chest area just below the collarbone (this is the most common place.)  · In the arms.  · In the belly (abdomen) area.  · In the legs.  PARTS OF THE PORT  A port has 2 main parts:  · The reservoir. The reservoir is round, disc-shaped, and will be a small, raised area under your skin.  · The reservoir is the part where a needle is inserted (accessed) to either give medicines or to draw blood.  · The catheter. The catheter is a long, slender tube that extends from the reservoir. The catheter is placed into a large vein.  · Medicine that is inserted into the reservoir goes into the catheter and then into the vein.  INSERTION OF THE PORT  · The port is surgically placed in either an operating room or in a procedural area (interventional radiology).  · Medicine may be given to help you relax during the procedure.  · The skin where the port will be inserted is numbed (local anesthetic).  · 1 or 2 small cuts (incisions) will be made in the skin to insert the port.  · The port can be used after it has been inserted.  INCISION SITE CARE  · The incision site may have small adhesive strips on it. This helps keep the incision site closed. Sometimes, no adhesive strips are placed. Instead of adhesive strips, a special kind of surgical glue is used to keep the incision closed.  · If adhesive strips were placed on the incision sites, do not take them off. They will fall off on their own.  · The incision site may be sore for 1 to 2 days. Pain medicine can help.  · Do not get the incision site wet. Bathe or shower as directed by your caregiver.  · The incision site should heal in 5 to 7 days. A small scar may form after the  incision has healed.  ACCESSING THE PORT  Special steps must be taken to access the port:  · Before the port is accessed, a numbing cream can be placed on the skin. This helps numb the skin over the port site.  · A sterile technique is used to access the port.  · The port is accessed with a needle. Only "non-coring" port needles should be used to access the port. Once the port is accessed, a blood return should be checked. This helps ensure the port is in the vein and is not clogged (clotted).  · If your caregiver believes your port should remain accessed, a clear (transparent) bandage will be placed over the needle site. The bandage and needle will need to be changed every week or as directed by your caregiver.  · Keep the bandage covering the needle clean and dry. Do not get it wet. Follow your caregiver's instructions on how to take a shower or bath when the port is accessed.  · If your port does not need to stay accessed, no bandage is needed over the port.  FLUSHING THE PORT  Flushing the port keeps it from getting clogged. How often the port is flushed depends on:  · If a   constant infusion is running. If a constant infusion is running, the port may not need to be flushed.  · If intermittent medicines are given.  · If the port is not being used.  For intermittent medicines:  · The port will need to be flushed:  · After medicines have been given.  · After blood has been drawn.  · As part of routine maintenance.  · A port is normally flushed with:  · Normal saline.  · Heparin.  · Follow your caregiver's advice on how often, how much, and the type of flush to use on your port.  IMPORTANT PORT INFORMATION  · Tell your caregiver if you are allergic to heparin.  · After your port is placed, you will get a manufacturer's information card. The card has information about your port. Keep this card with you at all times.  · There are many types of ports available. Know what kind of port you have.  · In case of an  emergency, it may be helpful to wear a medical alert bracelet. This can help alert health care workers that you have a port.  · The port can stay in for as long as your caregiver believes it is necessary.  · When it is time for the port to come out, surgery will be done to remove it. The surgery will be similar to how the port was put in.  · If you are in the hospital or clinic:  · Your port will be taken care of and flushed by a nurse.  · If you are at home:  · A home health care nurse may give medicines and take care of the port.  · You or a family member can get special training and directions for giving medicine and taking care of the port at home.  SEEK IMMEDIATE MEDICAL CARE IF:   · Your port does not flush or you are unable to get a blood return.  · New drainage or pus is coming from the incision.  · A bad smell is coming from the incision site.  · You develop swelling or increased redness at the incision site.  · You develop increased swelling or pain at the port site.  · You develop swelling or pain in the surrounding skin near the port.  · You have an oral temperature above 102° F (38.9° C), not controlled by medicine.  MAKE SURE YOU:   · Understand these instructions.  · Will watch your condition.  · Will get help right away if you are not doing well or get worse.  Document Released: 11/20/2005 Document Revised: 02/12/2012 Document Reviewed: 02/11/2009  ExitCare® Patient Information ©2014 [employer].

## 2013-07-29 ENCOUNTER — Telehealth: Payer: Self-pay | Admitting: Oncology

## 2013-07-29 NOTE — Telephone Encounter (Signed)
Returned pt's call re r/s'ing 9/2 flush appt. Pt given new flush appt for 9/19 - d/t per pt request. Also pt made aware that LL is back in office 2 days per wk and pt would like 10/14 appt moved to be w/LL. Pt made aware that lb/flush f/u for 10/14 would not be able to remain the same but moved to one of the 2 days LL is in office. appts for 10/14 moved to 10/31 lb/flush/LL @ 12:15pm - d/t per pt.

## 2013-08-20 ENCOUNTER — Other Ambulatory Visit: Payer: Self-pay | Admitting: Dermatology

## 2013-08-22 ENCOUNTER — Ambulatory Visit (HOSPITAL_BASED_OUTPATIENT_CLINIC_OR_DEPARTMENT_OTHER): Payer: No Typology Code available for payment source

## 2013-08-22 VITALS — BP 124/61 | HR 85 | Temp 98.0°F | Resp 20

## 2013-08-22 DIAGNOSIS — Z452 Encounter for adjustment and management of vascular access device: Secondary | ICD-10-CM

## 2013-08-22 DIAGNOSIS — D0591 Unspecified type of carcinoma in situ of right breast: Secondary | ICD-10-CM

## 2013-08-22 DIAGNOSIS — D0590 Unspecified type of carcinoma in situ of unspecified breast: Secondary | ICD-10-CM

## 2013-08-22 MED ORDER — HEPARIN SOD (PORK) LOCK FLUSH 100 UNIT/ML IV SOLN
500.0000 [IU] | Freq: Once | INTRAVENOUS | Status: AC
  Administered 2013-08-22: 500 [IU] via INTRAVENOUS
  Filled 2013-08-22: qty 5

## 2013-08-22 MED ORDER — SODIUM CHLORIDE 0.9 % IJ SOLN
10.0000 mL | INTRAMUSCULAR | Status: DC | PRN
  Administered 2013-08-22: 10 mL via INTRAVENOUS
  Filled 2013-08-22: qty 10

## 2013-08-22 NOTE — Patient Instructions (Addendum)

## 2013-09-16 ENCOUNTER — Other Ambulatory Visit: Payer: Self-pay | Admitting: Lab

## 2013-09-16 ENCOUNTER — Ambulatory Visit: Payer: Self-pay

## 2013-10-01 ENCOUNTER — Ambulatory Visit: Payer: Self-pay | Admitting: Oncology

## 2013-10-01 ENCOUNTER — Other Ambulatory Visit: Payer: Self-pay | Admitting: Lab

## 2013-10-01 ENCOUNTER — Telehealth: Payer: Self-pay | Admitting: *Deleted

## 2013-10-01 NOTE — Telephone Encounter (Signed)
Patient calls.  She will be unable to make her appt.today for lab, PAC flush and see Dr. Darrold Span.  She is ill.  She saw her PCP on Friday 10/24 with a viral illness.  She states it is not "the flu".  Her temp then was 99-100.  Last night she ran a temp of 103.  This am it is down to 101.2.  Her PCP is also treating her for a bladder infection.  Patient is very concerned about her PAC flush and does not want to "be late with it." Let her know PAC was last flushed on 08-22-13.  It has been less than 6 weeks.  Our usual recommendation is every 6-8 weeks. However, we will keep that in mind when we r/s her appointment and if we need to get her in for a PAC flush before she is able to r/s with Dr. Cleophas Dunker, we will be glad to do that. Also recommended that she call her PCP back this am as it is concerning that her fevers are much more than when they saw her last week.  She may need to be re-evaluated.  She stated that they are also treating her for a bladder infection, which is all the more reason for her to call them and be re-evaluated.  Patient will call her PCP this am and I let her know that we will be glad to r/s her appt. for lab, PAC flush and appt. With Dr. Belva Bertin. Will give this to Dr. Precious Reel nurse for today.

## 2013-10-02 ENCOUNTER — Telehealth: Payer: Self-pay

## 2013-10-02 NOTE — Telephone Encounter (Signed)
Gave Daisy [name] her rescheduled appointment time for 10-15-13 at 1200 lab, flush and vist.  Pt. Verbalized understanding.

## 2013-10-03 ENCOUNTER — Ambulatory Visit: Payer: Self-pay | Admitting: Oncology

## 2013-10-15 ENCOUNTER — Encounter (INDEPENDENT_AMBULATORY_CARE_PROVIDER_SITE_OTHER): Payer: Self-pay

## 2013-10-15 ENCOUNTER — Ambulatory Visit: Payer: No Typology Code available for payment source

## 2013-10-15 ENCOUNTER — Ambulatory Visit (HOSPITAL_BASED_OUTPATIENT_CLINIC_OR_DEPARTMENT_OTHER): Payer: No Typology Code available for payment source | Admitting: Oncology

## 2013-10-15 ENCOUNTER — Encounter: Payer: Self-pay | Admitting: Oncology

## 2013-10-15 ENCOUNTER — Other Ambulatory Visit (HOSPITAL_BASED_OUTPATIENT_CLINIC_OR_DEPARTMENT_OTHER): Payer: No Typology Code available for payment source | Admitting: Lab

## 2013-10-15 ENCOUNTER — Telehealth: Payer: Self-pay | Admitting: Oncology

## 2013-10-15 VITALS — BP 115/75 | HR 93 | Temp 98.8°F | Resp 18 | Ht 62.0 in | Wt 205.1 lb

## 2013-10-15 DIAGNOSIS — C50912 Malignant neoplasm of unspecified site of left female breast: Secondary | ICD-10-CM

## 2013-10-15 DIAGNOSIS — E2749 Other adrenocortical insufficiency: Secondary | ICD-10-CM

## 2013-10-15 DIAGNOSIS — Z853 Personal history of malignant neoplasm of breast: Secondary | ICD-10-CM

## 2013-10-15 DIAGNOSIS — R109 Unspecified abdominal pain: Secondary | ICD-10-CM

## 2013-10-15 DIAGNOSIS — D0510 Intraductal carcinoma in situ of unspecified breast: Secondary | ICD-10-CM

## 2013-10-15 DIAGNOSIS — D0591 Unspecified type of carcinoma in situ of right breast: Secondary | ICD-10-CM

## 2013-10-15 DIAGNOSIS — D0590 Unspecified type of carcinoma in situ of unspecified breast: Secondary | ICD-10-CM

## 2013-10-15 LAB — CBC WITH DIFFERENTIAL/PLATELET
Basophils Absolute: 0.1 10*3/uL (ref 0.0–0.1)
EOS%: 1.4 % (ref 0.0–7.0)
HGB: 12.5 g/dL (ref 11.6–15.9)
MCH: 26.7 pg (ref 25.1–34.0)
NEUT#: 6.8 10*3/uL — ABNORMAL HIGH (ref 1.5–6.5)
RDW: 14.6 % — ABNORMAL HIGH (ref 11.2–14.5)
lymph#: 1.3 10*3/uL (ref 0.9–3.3)

## 2013-10-15 MED ORDER — HEPARIN SOD (PORK) LOCK FLUSH 100 UNIT/ML IV SOLN
500.0000 [IU] | Freq: Once | INTRAVENOUS | Status: AC
  Administered 2013-10-15: 500 [IU] via INTRAVENOUS
  Filled 2013-10-15: qty 5

## 2013-10-15 MED ORDER — SODIUM CHLORIDE 0.9 % IJ SOLN
10.0000 mL | Freq: Once | INTRAMUSCULAR | Status: AC
  Administered 2013-10-15: 10 mL via INTRAVENOUS
  Filled 2013-10-15: qty 10

## 2013-10-15 NOTE — Progress Notes (Signed)
OFFICE PROGRESS NOTE   10/15/2013   Physicians:C.Duane Lope, T.Rosenbower, J.Hayes, Nathen May [facility])   INTERVAL HISTORY:  Patient is seen, alone for visit, coordinating with PAC flush and in follow up of history of stage 1 left breast cancer and subsequent DCIS left breast. She has no complaints related to the breast problems, or the PAC. She does complain of pain left flank/ posterior iliac crest area which she feels is same as multiple previous shingles episodes. This pain began <24 hours ago, is slightly eased only with firm direct pressure to area, and has not had any associated rash. She states that valtrex "does not help any more", tho I am not clear if she means prophylactic or therapeutic doses. Note she is immunosuppressed with chronic steroids for Addison's. She seems to have otherwise been stable recently, and is to see Dr Rosalita Chessman tomorrow.   Patient had portacath placed by Dr Abbey Chatters during admission in Nov 2013 for Addison's crisis, being flushed at this office every 6-8 weeks when not otherwise used.    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 She additionally has history of T1N0 ER/PR positive left breast cancer in 1993, treated with lumpectomy and axillary node evaluation and adjuvant adriamycin/ cytoxan by Dr.Neijstrom, local radiation, then unable to tolerate tamoxifen due to migraines. Most recent mammograms were at [facility] 06-02-2013 with scattered fibroglandular tissue and no mammographic findings of concern.   Review of systems as above, also: Difficulty with memory as she has had. No fever, no N/V, no respiratory symptoms, no LE swelling, no bleeding. Remainder of 10 point Review of Systems negative.  Objective:  Vital signs in last 24 hours:  BP 115/75  Pulse 93   Temp(Src) 98.8 F (37.1 C) (Oral)  Resp 18  Ht 5\' 2"  (1.575 m)  Wt 205 lb 1.6 oz (93.033 kg)  BMI 37.50 kg/m2  Alert, oriented and appropriate tho loses train of thought easily. Ambulatory slowly without assistance.    HEENT:PERRL, sclerae not icteric. Oral mucosa moist without lesions, posterior pharynx clear. Hair thin. Neck supple. No JVD.  Lymphatics:no cervical,suraclavicular, axillary adenopathy Resp: clear to auscultation bilaterally  Cardio: regular rate and rhythm. No gallop. GI: obese, soft, nontender, bowel sounds present. Musculoskeletal/ Extremities: without pitting edema, cords, tenderness Skin without rash across back or buttocks. 3 cm ecchymosis at left SI area due to applying pressure against door frame Breasts: right without dominant mass, skin or nipple findings; left mastectomy scar not remarkable  Axillae benign. Portacath-without erythema or tenderness  Lab Results:  Results for orders placed in visit on 10/15/13  CBC WITH DIFFERENTIAL      Result Value Range   WBC 8.8  3.9 - 10.3 10e3/uL   NEUT# 6.8 (*) 1.5 - 6.5 10e3/uL   HGB 12.5  11.6 - 15.9 g/dL   HCT 16.1  09.6 - 04.5 %   Platelets 191  145 - 400 10e3/uL   MCV 83.7  79.5 - 101.0 fL   MCH 26.7  25.1 - 34.0 pg   MCHC 31.9  31.5 - 36.0 g/dL   RBC 4.09  8.11 - 9.14 10e6/uL   RDW 14.6 (*) 11.2 - 14.5 %   lymph# 1.3  0.9 - 3.3 10e3/uL   MONO# 0.5  0.1 - 0.9 10e3/uL   Eosinophils Absolute 0.1  0.0 - 0.5 10e3/uL   Basophils  Absolute 0.1  0.0 - 0.1 10e3/uL   NEUT% 77.5 (*) 38.4 - 76.8 %   LYMPH% 14.7  14.0 - 49.7 %   MONO% 5.6  0.0 - 14.0 %   EOS% 1.4  0.0 - 7.0 %   BASO% 0.8  0.0 - 2.0 %    CMET hemolyzed and we did not try to redraw  Studies/Results:  Report of mammogram done [facility] 06-02-13 reviewed in this EMR, however I am unable to copy it into this note.  Medications: I have reviewed the patient's current medications. 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 Previously intolerant to tamoxifen.  History of T1N0 ER/PR + left breast cancer 1993, not known recurrent. Mammograms at [facility] next June 2015. 2. Addison's disease on daily prednisone 3.flu vaccine given by PCP, which patient feels caused generalized aching and illness 3 weeks ago, 4.morbid obesity, which has been difficult to address with other issues  5.hypothyroidism on replacement  6. GERD  7.chronic pain, followed by pain clinic on prn morphine 8.PAC in due to extremely difficult IV access. Continue to flush every 6-8 weeks when not otherwise used 9. History at least from patient of recurrent zoster episodes over several years, with pain x <24 hours similar to previous. Famvir as above.    We will flush PAC as noted and I will see her with flush in 8-10 months.   LIVESAY,LENNIS P, MD   10/15/2013, 1:45 PM

## 2013-10-15 NOTE — Patient Instructions (Signed)

## 2013-10-16 ENCOUNTER — Other Ambulatory Visit: Payer: Self-pay

## 2013-10-16 DIAGNOSIS — B029 Zoster without complications: Secondary | ICD-10-CM

## 2013-10-16 MED ORDER — FAMCICLOVIR 500 MG PO TABS: 500.0000 mg | ORAL_TABLET | Freq: Three times a day (TID) | ORAL | Status: DC

## 2013-10-21 ENCOUNTER — Emergency Department (HOSPITAL_COMMUNITY)
Admission: EM | Admit: 2013-10-21 | Discharge: 2013-10-21 | Disposition: A | Discharge status: Home / Self Care | Payer: No Typology Code available for payment source | Attending: Emergency Medicine | Admitting: Emergency Medicine

## 2013-10-21 ENCOUNTER — Emergency Department (HOSPITAL_COMMUNITY): Payer: No Typology Code available for payment source

## 2013-10-21 ENCOUNTER — Encounter (HOSPITAL_COMMUNITY): Payer: Self-pay | Admitting: Emergency Medicine

## 2013-10-21 DIAGNOSIS — R609 Edema, unspecified: Secondary | ICD-10-CM | POA: Insufficient documentation

## 2013-10-21 DIAGNOSIS — J449 Chronic obstructive pulmonary disease, unspecified: Secondary | ICD-10-CM | POA: Insufficient documentation

## 2013-10-21 DIAGNOSIS — Z8611 Personal history of tuberculosis: Secondary | ICD-10-CM | POA: Insufficient documentation

## 2013-10-21 DIAGNOSIS — E119 Type 2 diabetes mellitus without complications: Secondary | ICD-10-CM | POA: Insufficient documentation

## 2013-10-21 DIAGNOSIS — G35 Multiple sclerosis: Secondary | ICD-10-CM | POA: Insufficient documentation

## 2013-10-21 DIAGNOSIS — G4730 Sleep apnea, unspecified: Secondary | ICD-10-CM | POA: Insufficient documentation

## 2013-10-21 DIAGNOSIS — I10 Essential (primary) hypertension: Secondary | ICD-10-CM | POA: Insufficient documentation

## 2013-10-21 DIAGNOSIS — R5383 Other fatigue: Secondary | ICD-10-CM

## 2013-10-21 DIAGNOSIS — IMO0001 Reserved for inherently not codable concepts without codable children: Secondary | ICD-10-CM | POA: Insufficient documentation

## 2013-10-21 DIAGNOSIS — D649 Anemia, unspecified: Secondary | ICD-10-CM | POA: Insufficient documentation

## 2013-10-21 DIAGNOSIS — Z8619 Personal history of other infectious and parasitic diseases: Secondary | ICD-10-CM | POA: Insufficient documentation

## 2013-10-21 DIAGNOSIS — Z8701 Personal history of pneumonia (recurrent): Secondary | ICD-10-CM | POA: Insufficient documentation

## 2013-10-21 DIAGNOSIS — F411 Generalized anxiety disorder: Secondary | ICD-10-CM | POA: Insufficient documentation

## 2013-10-21 DIAGNOSIS — G4733 Obstructive sleep apnea (adult) (pediatric): Secondary | ICD-10-CM | POA: Insufficient documentation

## 2013-10-21 DIAGNOSIS — G2581 Restless legs syndrome: Secondary | ICD-10-CM | POA: Insufficient documentation

## 2013-10-21 DIAGNOSIS — Z79899 Other long term (current) drug therapy: Secondary | ICD-10-CM | POA: Insufficient documentation

## 2013-10-21 DIAGNOSIS — E039 Hypothyroidism, unspecified: Secondary | ICD-10-CM | POA: Insufficient documentation

## 2013-10-21 DIAGNOSIS — IMO0002 Reserved for concepts with insufficient information to code with codable children: Secondary | ICD-10-CM | POA: Insufficient documentation

## 2013-10-21 DIAGNOSIS — Z87448 Personal history of other diseases of urinary system: Secondary | ICD-10-CM | POA: Insufficient documentation

## 2013-10-21 DIAGNOSIS — G43909 Migraine, unspecified, not intractable, without status migrainosus: Secondary | ICD-10-CM | POA: Insufficient documentation

## 2013-10-21 DIAGNOSIS — M129 Arthropathy, unspecified: Secondary | ICD-10-CM | POA: Insufficient documentation

## 2013-10-21 DIAGNOSIS — J4489 Other specified chronic obstructive pulmonary disease: Secondary | ICD-10-CM | POA: Insufficient documentation

## 2013-10-21 DIAGNOSIS — Z853 Personal history of malignant neoplasm of breast: Secondary | ICD-10-CM | POA: Insufficient documentation

## 2013-10-21 DIAGNOSIS — R5381 Other malaise: Secondary | ICD-10-CM | POA: Insufficient documentation

## 2013-10-21 DIAGNOSIS — K219 Gastro-esophageal reflux disease without esophagitis: Secondary | ICD-10-CM | POA: Insufficient documentation

## 2013-10-21 DIAGNOSIS — Z9981 Dependence on supplemental oxygen: Secondary | ICD-10-CM | POA: Insufficient documentation

## 2013-10-21 LAB — URINALYSIS, ROUTINE W REFLEX MICROSCOPIC
Glucose, UA: NEGATIVE mg/dL
Hgb urine dipstick: NEGATIVE
Ketones, ur: NEGATIVE mg/dL
Leukocytes, UA: NEGATIVE
Nitrite: NEGATIVE
Protein, ur: NEGATIVE mg/dL
Specific Gravity, Urine: 1.03 (ref 1.005–1.030)
Urobilinogen, UA: 0.2 mg/dL (ref 0.0–1.0)
pH: 6 (ref 5.0–8.0)

## 2013-10-21 LAB — CBC WITH DIFFERENTIAL/PLATELET
Basophils Absolute: 0 10*3/uL (ref 0.0–0.1)
Basophils Relative: 1 % (ref 0–1)
Eosinophils Absolute: 0.1 10*3/uL (ref 0.0–0.7)
Eosinophils Relative: 2 % (ref 0–5)
HCT: 37.7 % (ref 36.0–46.0)
Hemoglobin: 12.2 g/dL (ref 12.0–15.0)
Lymphocytes Relative: 32 % (ref 12–46)
Lymphs Abs: 2.7 10*3/uL (ref 0.7–4.0)
MCH: 27 pg (ref 26.0–34.0)
MCHC: 32.4 g/dL (ref 30.0–36.0)
MCV: 83.4 fL (ref 78.0–100.0)
Monocytes Absolute: 0.7 10*3/uL (ref 0.1–1.0)
Monocytes Relative: 8 % (ref 3–12)
Neutro Abs: 4.7 10*3/uL (ref 1.7–7.7)
Neutrophils Relative %: 57 % (ref 43–77)
Platelets: 195 10*3/uL (ref 150–400)
RBC: 4.52 MIL/uL (ref 3.87–5.11)
RDW: 14.6 % (ref 11.5–15.5)
WBC: 8.2 10*3/uL (ref 4.0–10.5)

## 2013-10-21 LAB — BASIC METABOLIC PANEL
BUN: 14 mg/dL (ref 6–23)
CO2: 30 mEq/L (ref 19–32)
Calcium: 9.2 mg/dL (ref 8.4–10.5)
Chloride: 103 mEq/L (ref 96–112)
Creatinine, Ser: 0.7 mg/dL (ref 0.50–1.10)
GFR calc Af Amer: >90 mL/min (ref >90)
GFR calc non Af Amer: >90 mL/min (ref >90)
Glucose, Bld: 152 mg/dL — ABNORMAL HIGH (ref 70–99)
Potassium: 3.2 mEq/L — ABNORMAL LOW (ref 3.5–5.1)
Sodium: 141 mEq/L (ref 135–145)

## 2013-10-21 LAB — MAGNESIUM: Magnesium: 1.7 mg/dL (ref 1.5–2.5)

## 2013-10-21 MED ORDER — ONDANSETRON HCL 4 MG/2ML IJ SOLN
4.0000 mg | Freq: Once | INTRAMUSCULAR | Status: AC
  Administered 2013-10-21: 4 mg via INTRAVENOUS
  Filled 2013-10-21: qty 2

## 2013-10-21 MED ORDER — MORPHINE SULFATE 4 MG/ML IJ SOLN
6.0000 mg | Freq: Once | INTRAMUSCULAR | Status: AC
  Administered 2013-10-21: 6 mg via INTRAVENOUS
  Filled 2013-10-21: qty 2

## 2013-10-21 MED ORDER — HEPARIN SOD (PORK) LOCK FLUSH 100 UNIT/ML IV SOLN
500.0000 [IU] | Freq: Once | INTRAVENOUS | Status: DC
  Filled 2013-10-21: qty 5

## 2013-10-21 MED ORDER — HYDROCORTISONE SOD SUCCINATE 100 MG IJ SOLR
100.0000 mg | Freq: Once | INTRAMUSCULAR | Status: AC
  Administered 2013-10-21: 21:00:00 via INTRAVENOUS
  Filled 2013-10-21: qty 2

## 2013-10-21 MED ORDER — KETOROLAC TROMETHAMINE 30 MG/ML IJ SOLN
30.0000 mg | Freq: Once | INTRAMUSCULAR | Status: AC
  Administered 2013-10-21: 30 mg via INTRAVENOUS
  Filled 2013-10-21: qty 1

## 2013-10-21 NOTE — ED Provider Notes (Signed)
CSN: [id number]     Arrival date & time 10/21/13  1516 History   First MD Initiated Contact with Patient 10/21/13 1827     Chief Complaint  Patient presents with  . addison's disease   (Consider location/radiation/quality/duration/timing/severity/associated sxs/prior Treatment) HPI  62yF with multiple complaints. Since Saturday has not been herself. She says confusion, but description more consistent with irritability. Fighting with husband which is unusual for her. Saying things like "Could you shut the damn door?" and such language is out of character. Feels fatigued. Chronic back pain but worse and also increasing b/l LE pain from feet to hips. No trauma. No fever or chills. Increased LE swelling and abdominal bloating. Rash to hands, back and face. Hx of Addison's. Typically takes 5mg  prednisone daily. She self increased this to 10mg  for the past two days because of her symptoms.  Reports fever to 102 at 0430 today. Took aspirin shortly before coming to ED.   Past Medical History  Diagnosis Date  . Asthma   . Hypertension   . Shingles   . Sleep apnea   . OSA on CPAP   . Adenocarcinoma of breast 07/13/1992  . Hypothyroidism   . H/O esophageal spasm     "I can have as many as 28 TUMS before they stop"  . Radiation adverse effect     "100% blockage on left; 50% on right salivary; can drain w/massage"  . Diabetes mellitus     "diet controlled"  . Restless leg syndrome, controlled 02/19/12    "much better since being on CPAP; been on that ~ 1 month now"  . COPD (chronic obstructive pulmonary disease)   . Pneumonia     "I've had it several times"  . Chronic bronchitis   . Positive TB test     "ran deep in my father's family; went on INH for awhile; it caused migraines"  . Adrenal insufficiency     "w/secondary Addison's"  . Anemia   . GERD (gastroesophageal reflux disease)   . H/O hiatal hernia   . Upper GI bleeding 1970-1980    "had 4"  . Hepatitis B infection 1971    "in  high school; after drinking after football player"  . Migraines     "much better now" (02/19/12)  . Headache(784.0)     "when I don't eat" (02/19/12)  . Anxiety   . Arthritis   . Fibromyalgia   . Multiple sclerosis 05/1988    "dr's in [place] don't think I have it; dr's in [place] still do" (02/19/12)  . On home oxygen therapy     2 L at night  . Bruises easily   . Stress incontinence in female   . Addison's disease    Past Surgical History  Procedure Laterality Date  . Tonsillectomy and adenoidectomy  1964  . Mastectomy  2010    left  . Appendectomy  1960's  . Cholecystectomy  1980's  . Abdominal hysterectomy  1987  . Bilateral oophorectomy  1988  . Breast lumpectomy  07/13/1992    left  . Spinal cord stimulator insertion  ~ 2004  . Back surgery  ~ 2004    "chipped disk out to put in permanent stimulator"  . Band hemorrhoidectomy  1979  . Salivary gland surgery  ~ 2002    "left was 100% blocked twice; right 50%; only had one surgery"  . Laparoscopy    . Portacath placement  10/30/2012    Procedure: INSERTION PORT-A-CATH;  Surgeon: Tawanna Cooler  Laurie Panda, MD;  Location: [facility];  Service: General;  Laterality: Right;  Right Ultrasound guided port-a-cath insertion   Family History  Problem Relation Age of Onset  . Lung cancer Paternal Uncle     smoker  . Breast cancer Cousin 37    maternal cousin  . Throat cancer Cousin     maternal cousin   History  Substance Use Topics  . Smoking status: Never Smoker   . Smokeless tobacco: Never Used  . Alcohol Use: No   OB History   Grav Para Term Preterm Abortions TAB SAB Ect Mult Living                 Review of Systems  All systems reviewed and negative, other than as noted in HPI.   Allergies  Compazine; Iohexol; Ivp dye; Lisinopril; Metformin and related; Metoclopramide hcl; Promethazine hcl; Sulfa antibiotics; Terbinafine hcl; Vancomycin; Guaifenesin; and Amlodipine  Home Medications   Current Outpatient Rx  Name   Route  Sig  Dispense  Refill  . B Complex-C (B-COMPLEX WITH VITAMIN C) tablet   Oral   Take 1 tablet by mouth daily.         . benzonatate (TESSALON) 200 MG capsule               . budesonide-formoterol (SYMBICORT) 160-4.5 MCG/ACT inhaler   Inhalation   Inhale 2 puffs into the lungs 2 (two) times daily.          . Calcium Carbonate-Vitamin D (CALTRATE 600+D PO)   Oral   Take 1 tablet by mouth daily.          . clonazePAM (KLONOPIN) 1 MG tablet   Oral   Take 1 mg by mouth 3 (three) times daily.          . cyanocobalamin (,VITAMIN B-12,) 1000 MCG/ML injection   Intramuscular   Inject 1,000 mcg into the muscle every 30 (thirty) days.         . cyclobenzaprine (FLEXERIL) 10 MG tablet   Oral   Take 10 mg by mouth 3 (three) times daily. Muscle spasms         . Esomeprazole Magnesium (NEXIUM 24HR PO)   Oral   Take 1 tablet by mouth at bedtime.         . famciclovir (FAMVIR) 500 MG tablet   Oral   Take 1 tablet (500 mg total) by mouth 3 (three) times daily.   21 tablet   0   . fish oil-omega-3 fatty acids 1000 MG capsule   Oral   Take 1 g by mouth daily.          . fluorouracil (EFUDEX) 5 % cream   Topical   Apply 1 application topically 2 (two) times daily.         . furosemide (LASIX) 20 MG tablet   Oral   Take 20 mg by mouth daily as needed. For fluid         . Hydrocortisone (CORTIZONE-10 ECZEMA EX)   Apply externally   Apply 1 application topically 2 (two) times daily as needed. For eczema         . hydrOXYzine (ATARAX/VISTARIL) 50 MG tablet   Oral   Take 50 mg by mouth 4 (four) times daily as needed. itching         . levalbuterol (XOPENEX HFA) 45 MCG/ACT inhaler   Inhalation   Inhale 2 puffs into the lungs every 4 (four) hours as needed. Shortness  of breath.         . levothyroxine (SYNTHROID, LEVOTHROID) 75 MCG tablet   Oral   Take 75 mcg by mouth at bedtime.          . lidocaine-prilocaine (EMLA) cream      Apply to  PAC site 1-2 hours prior to access as directed.   30 g   1   . losartan-hydrochlorothiazide (HYZAAR) 100-12.5 MG per tablet   Oral   Take 1 tablet by mouth every morning.          . magnesium chloride (SLOW-MAG) 64 MG TBEC   Oral   Take 1 tablet by mouth daily as needed. With lasix         . methocarbamol (ROBAXIN) 500 MG tablet               . metoprolol tartrate (LOPRESSOR) 25 MG tablet   Oral   Take 25 mg by mouth 2 (two) times daily.         Marland Kitchen morphine (MSIR) 30 MG tablet   Oral   Take 60 mg by mouth every 4 (four) hours as needed. Pain.         . ondansetron (ZOFRAN) 4 MG tablet   Oral   Take 4 mg by mouth every 8 (eight) hours as needed. Nausea         . polyethylene glycol (MIRALAX / GLYCOLAX) packet   Oral   Take 17 g by mouth daily as needed. For increased constipation         . potassium chloride SA (K-DUR,KLOR-CON) 20 MEQ tablet   Oral   Take 20 mEq by mouth daily as needed. Take with lasix         . predniSONE (DELTASONE) 5 MG tablet   Oral   Take 5 mg by mouth daily.          Marland Kitchen senna (SENOKOT) 8.6 MG TABS   Oral   Take 1 tablet (8.6 mg total) by mouth 2 (two) times daily.   120 each      . tretinoin (RETIN-A) 0.05 % cream                BP 147/81  Pulse 104  Temp(Src) 98 F (36.7 C)  Resp 18  SpO2 94% Physical Exam  Nursing note and vitals reviewed. Constitutional: She appears well-developed and well-nourished. No distress.  HENT:  Head: Normocephalic and atraumatic.  Cracks corners of mouth  Eyes: Conjunctivae are normal. Right eye exhibits no discharge. Left eye exhibits no discharge.  Neck: Neck supple.  Cardiovascular: Normal rate, regular rhythm and normal heart sounds.  Exam reveals no gallop and no friction rub.   No murmur heard. Pulmonary/Chest: Effort normal and breath sounds normal. No respiratory distress.  Abdominal: Soft. She exhibits no distension. There is no tenderness.  Musculoskeletal: She  exhibits edema. She exhibits no tenderness.  Symmetric non pitting LE edema. Mild diffuse tenderness of b/l calves and thighs. No palpable cords. No concerning skin lesions. No appreciable joint swelling.   Neurological: She is alert.  Skin: Skin is warm and dry. She is not diaphoretic.  Fine erythematous papular rash to dorsum of hands. I could not appreciate skin changes on back in area pt reports.   Psychiatric: She has a normal mood and affect. Her behavior is normal. Thought content normal.    ED Course  Procedures (including critical care time) Labs Review Labs Reviewed  BASIC METABOLIC PANEL - Abnormal; Notable  for the following:    Potassium 3.2 (*)    Glucose, Bld 152 (*)    All other components within normal limits  URINALYSIS, ROUTINE W REFLEX MICROSCOPIC - Abnormal; Notable for the following:    Bilirubin Urine SMALL (*)    All other components within normal limits  CBC WITH DIFFERENTIAL  MAGNESIUM   Imaging Review Dg Chest 2 View  10/21/2013   CLINICAL DATA:  Short of breath  EXAM: CHEST  2 VIEW  COMPARISON:  CT 09/09/2013, chest radiograph 06/05/2013  FINDINGS: There is a power port in the right chest unchanged. There is electrodes in the spinal canal in the mid thoracic spine unchanged. More superficial electrode is noted in the subcutaneous tissues of the back. This is also unchanged. Stable large cardiac silhouette. Sigmoid scoliosis of the spine noted. Left axillary lymphadenectomy clips present. No effusion, infiltrate, or pneumothorax.  IMPRESSION: 1.  No acute cardiopulmonary process.  . 2. Port and electrodes unchanged.   Electronically Signed   By: Genevive Bi M.D.   On: 10/21/2013 19:38    EKG Interpretation   None       MDM   1. Fatigue   2. Edema     62yF with multiple vague symptoms. Pt with hx of adrenal insufficiency. Was given stress dose steroids. Several symptoms possibly attributable to adrenal insufficiency, but clinical picture not that  of adrenal crisis. Mild hypertension, hypokalemic, hyperglycemic, normal Na. Pt does have mild nonspecific rash on hands, but not classic hyperpigmentation. HD stable. W/u fairly unremarkable. Low suspicion for emergent process. Return precautions discussed. Outpt FU.   Raeford Razor, MD 10/23/13 [phone]

## 2013-10-21 NOTE — ED Notes (Signed)
Pt presents to ed with c/o swelling in her feet and abdomen as well as confusion. Pt sts hx of addison's and MS. Pt also reports pain in back and legs.

## 2013-10-24 ENCOUNTER — Other Ambulatory Visit (HOSPITAL_COMMUNITY): Payer: Self-pay | Admitting: Internal Medicine

## 2013-10-24 DIAGNOSIS — E2749 Other adrenocortical insufficiency: Secondary | ICD-10-CM

## 2013-11-04 ENCOUNTER — Ambulatory Visit (HOSPITAL_COMMUNITY)
Admission: RE | Admit: 2013-11-04 | Discharge: 2013-11-04 | Disposition: A | Discharge status: Home / Self Care | Payer: No Typology Code available for payment source | Source: Ambulatory Visit | Attending: Internal Medicine | Admitting: Internal Medicine

## 2013-11-04 DIAGNOSIS — E2749 Other adrenocortical insufficiency: Secondary | ICD-10-CM | POA: Insufficient documentation

## 2013-11-04 MED ORDER — GADOBENATE DIMEGLUMINE 529 MG/ML IV SOLN
20.0000 mL | Freq: Once | INTRAVENOUS | Status: AC
  Administered 2013-11-04: 20 mL via INTRAVENOUS

## 2013-12-10 ENCOUNTER — Other Ambulatory Visit: Payer: Self-pay | Admitting: Oncology

## 2013-12-10 ENCOUNTER — Other Ambulatory Visit (HOSPITAL_BASED_OUTPATIENT_CLINIC_OR_DEPARTMENT_OTHER): Payer: No Typology Code available for payment source

## 2013-12-10 ENCOUNTER — Ambulatory Visit (HOSPITAL_BASED_OUTPATIENT_CLINIC_OR_DEPARTMENT_OTHER): Payer: No Typology Code available for payment source

## 2013-12-10 ENCOUNTER — Encounter: Payer: Self-pay | Admitting: *Deleted

## 2013-12-10 DIAGNOSIS — Z452 Encounter for adjustment and management of vascular access device: Secondary | ICD-10-CM

## 2013-12-10 DIAGNOSIS — Z853 Personal history of malignant neoplasm of breast: Secondary | ICD-10-CM

## 2013-12-10 DIAGNOSIS — D0510 Intraductal carcinoma in situ of unspecified breast: Secondary | ICD-10-CM

## 2013-12-10 DIAGNOSIS — Z95828 Presence of other vascular implants and grafts: Secondary | ICD-10-CM

## 2013-12-10 LAB — COMPREHENSIVE METABOLIC PANEL (CC13)
ALT: 274 U/L — AB (ref 0–55)
AST: 121 U/L — AB (ref 5–34)
Albumin: 3.5 g/dL (ref 3.5–5.0)
Alkaline Phosphatase: 198 U/L — ABNORMAL HIGH (ref 40–150)
Anion Gap: 10 mEq/L (ref 3–11)
BUN: 20.5 mg/dL (ref 7.0–26.0)
CO2: 29 mEq/L (ref 22–29)
CREATININE: 0.8 mg/dL (ref 0.6–1.1)
Calcium: 9.4 mg/dL (ref 8.4–10.4)
Chloride: 101 mEq/L (ref 98–109)
Glucose: 133 mg/dl (ref 70–140)
Potassium: 4.9 mEq/L (ref 3.5–5.1)
Sodium: 140 mEq/L (ref 136–145)
Total Bilirubin: 0.39 mg/dL (ref 0.20–1.20)
Total Protein: 7.5 g/dL (ref 6.4–8.3)

## 2013-12-10 LAB — CBC WITH DIFFERENTIAL/PLATELET
BASO%: 0.7 % (ref 0.0–2.0)
Basophils Absolute: 0.1 10*3/uL (ref 0.0–0.1)
EOS%: 1.2 % (ref 0.0–7.0)
Eosinophils Absolute: 0.1 10*3/uL (ref 0.0–0.5)
HCT: 42.9 % (ref 34.8–46.6)
HGB: 13.7 g/dL (ref 11.6–15.9)
LYMPH#: 1.2 10*3/uL (ref 0.9–3.3)
LYMPH%: 16 % (ref 14.0–49.7)
MCH: 26.8 pg (ref 25.1–34.0)
MCHC: 32 g/dL (ref 31.5–36.0)
MCV: 83.6 fL (ref 79.5–101.0)
MONO#: 0.2 10*3/uL (ref 0.1–0.9)
MONO%: 3.2 % (ref 0.0–14.0)
NEUT#: 5.9 10*3/uL (ref 1.5–6.5)
NEUT%: 78.9 % — ABNORMAL HIGH (ref 38.4–76.8)
Platelets: 258 10*3/uL (ref 145–400)
RBC: 5.13 10*6/uL (ref 3.70–5.45)
RDW: 14.6 % — ABNORMAL HIGH (ref 11.2–14.5)
WBC: 7.4 10*3/uL (ref 3.9–10.3)

## 2013-12-10 MED ORDER — HEPARIN SOD (PORK) LOCK FLUSH 100 UNIT/ML IV SOLN
500.0000 [IU] | Freq: Once | INTRAVENOUS | Status: AC
  Administered 2013-12-10: 500 [IU] via INTRAVENOUS
  Filled 2013-12-10: qty 5

## 2013-12-10 MED ORDER — SODIUM CHLORIDE 0.9 % IJ SOLN
10.0000 mL | INTRAMUSCULAR | Status: DC | PRN
  Administered 2013-12-10: 10 mL via INTRAVENOUS
  Filled 2013-12-10: qty 10

## 2013-12-10 NOTE — Patient Instructions (Signed)
Implanted Port Instructions  An implanted port is a central line that has a round shape and is placed under the skin. It is used for long-term IV (intravenous) access for:  · Medicine.  · Fluids.  · Liquid nutrition, such as TPN (total parenteral nutrition).  · Blood samples.  Ports can be placed:  · In the chest area just below the collarbone (this is the most common place.)  · In the arms.  · In the belly (abdomen) area.  · In the legs.  PARTS OF THE PORT  A port has 2 main parts:  · The reservoir. The reservoir is round, disc-shaped, and will be a small, raised area under your skin.  · The reservoir is the part where a needle is inserted (accessed) to either give medicines or to draw blood.  · The catheter. The catheter is a long, slender tube that extends from the reservoir. The catheter is placed into a large vein.  · Medicine that is inserted into the reservoir goes into the catheter and then into the vein.  INSERTION OF THE PORT  · The port is surgically placed in either an operating room or in a procedural area (interventional radiology).  · Medicine may be given to help you relax during the procedure.  · The skin where the port will be inserted is numbed (local anesthetic).  · 1 or 2 small cuts (incisions) will be made in the skin to insert the port.  · The port can be used after it has been inserted.  INCISION SITE CARE  · The incision site may have small adhesive strips on it. This helps keep the incision site closed. Sometimes, no adhesive strips are placed. Instead of adhesive strips, a special kind of surgical glue is used to keep the incision closed.  · If adhesive strips were placed on the incision sites, do not take them off. They will fall off on their own.  · The incision site may be sore for 1 to 2 days. Pain medicine can help.  · Do not get the incision site wet. Bathe or shower as directed by your caregiver.  · The incision site should heal in 5 to 7 days. A small scar may form after the  incision has healed.  ACCESSING THE PORT  Special steps must be taken to access the port:  · Before the port is accessed, a numbing cream can be placed on the skin. This helps numb the skin over the port site.  · A sterile technique is used to access the port.  · The port is accessed with a needle. Only "non-coring" port needles should be used to access the port. Once the port is accessed, a blood return should be checked. This helps ensure the port is in the vein and is not clogged (clotted).  · If your caregiver believes your port should remain accessed, a clear (transparent) bandage will be placed over the needle site. The bandage and needle will need to be changed every week or as directed by your caregiver.  · Keep the bandage covering the needle clean and dry. Do not get it wet. Follow your caregiver's instructions on how to take a shower or bath when the port is accessed.  · If your port does not need to stay accessed, no bandage is needed over the port.  FLUSHING THE PORT  Flushing the port keeps it from getting clogged. How often the port is flushed depends on:  · If a   constant infusion is running. If a constant infusion is running, the port may not need to be flushed.  · If intermittent medicines are given.  · If the port is not being used.  For intermittent medicines:  · The port will need to be flushed:  · After medicines have been given.  · After blood has been drawn.  · As part of routine maintenance.  · A port is normally flushed with:  · Normal saline.  · Heparin.  · Follow your caregiver's advice on how often, how much, and the type of flush to use on your port.  IMPORTANT PORT INFORMATION  · Tell your caregiver if you are allergic to heparin.  · After your port is placed, you will get a manufacturer's information card. The card has information about your port. Keep this card with you at all times.  · There are many types of ports available. Know what kind of port you have.  · In case of an  emergency, it may be helpful to wear a medical alert bracelet. This can help alert health care workers that you have a port.  · The port can stay in for as long as your caregiver believes it is necessary.  · When it is time for the port to come out, surgery will be done to remove it. The surgery will be similar to how the port was put in.  · If you are in the hospital or clinic:  · Your port will be taken care of and flushed by a nurse.  · If you are at home:  · A home health care nurse may give medicines and take care of the port.  · You or a family member can get special training and directions for giving medicine and taking care of the port at home.  SEEK IMMEDIATE MEDICAL CARE IF:   · Your port does not flush or you are unable to get a blood return.  · New drainage or pus is coming from the incision.  · A bad smell is coming from the incision site.  · You develop swelling or increased redness at the incision site.  · You develop increased swelling or pain at the port site.  · You develop swelling or pain in the surrounding skin near the port.  · You have an oral temperature above 102° F (38.9° C), not controlled by medicine.  MAKE SURE YOU:   · Understand these instructions.  · Will watch your condition.  · Will get help right away if you are not doing well or get worse.  Document Released: 11/20/2005 Document Revised: 02/12/2012 Document Reviewed: 02/11/2009  ExitCare® Patient Information ©2014 [employer].

## 2013-12-11 ENCOUNTER — Telehealth: Payer: Self-pay | Admitting: Oncology

## 2013-12-11 NOTE — Telephone Encounter (Signed)
per 1/7 pof cx'd lbs for 3/4, 4/29 and 6/24. pt has already completed 1/7 lb. flush appts remain. added f/u w/LL to 6/24 flush appt. s/w pt re f/u and new times for appts on 3/4, 4/29 and 6/24 all @ 12:15pm. per pt request sent a message to [facility] that pt has contacted Dr. Bertrum Sol office about the squishy knot in her cleavage and is waiting to hear back from his nurse.

## 2013-12-22 ENCOUNTER — Other Ambulatory Visit: Payer: Self-pay

## 2013-12-22 DIAGNOSIS — D0510 Intraductal carcinoma in situ of unspecified breast: Secondary | ICD-10-CM

## 2013-12-29 ENCOUNTER — Ambulatory Visit (INDEPENDENT_AMBULATORY_CARE_PROVIDER_SITE_OTHER): Payer: BC Managed Care – PPO | Admitting: General Surgery

## 2013-12-29 ENCOUNTER — Encounter (INDEPENDENT_AMBULATORY_CARE_PROVIDER_SITE_OTHER): Payer: BC Managed Care – PPO | Admitting: General Surgery

## 2014-01-13 ENCOUNTER — Ambulatory Visit (INDEPENDENT_AMBULATORY_CARE_PROVIDER_SITE_OTHER): Payer: BC Managed Care – PPO | Admitting: General Surgery

## 2014-02-04 ENCOUNTER — Ambulatory Visit (HOSPITAL_BASED_OUTPATIENT_CLINIC_OR_DEPARTMENT_OTHER): Payer: No Typology Code available for payment source

## 2014-02-04 ENCOUNTER — Other Ambulatory Visit: Payer: Self-pay

## 2014-02-04 VITALS — BP 135/79 | HR 98 | Temp 98.5°F

## 2014-02-04 DIAGNOSIS — Z452 Encounter for adjustment and management of vascular access device: Secondary | ICD-10-CM

## 2014-02-04 DIAGNOSIS — Z95828 Presence of other vascular implants and grafts: Secondary | ICD-10-CM

## 2014-02-04 DIAGNOSIS — D0590 Unspecified type of carcinoma in situ of unspecified breast: Secondary | ICD-10-CM

## 2014-02-04 MED ORDER — HEPARIN SOD (PORK) LOCK FLUSH 100 UNIT/ML IV SOLN
500.0000 [IU] | Freq: Once | INTRAVENOUS | Status: AC
  Administered 2014-02-04: 500 [IU] via INTRAVENOUS
  Filled 2014-02-04: qty 5

## 2014-02-04 MED ORDER — SODIUM CHLORIDE 0.9 % IJ SOLN
10.0000 mL | INTRAMUSCULAR | Status: DC | PRN
  Administered 2014-02-04: 10 mL via INTRAVENOUS
  Filled 2014-02-04: qty 10

## 2014-02-09 ENCOUNTER — Emergency Department (HOSPITAL_COMMUNITY): Payer: No Typology Code available for payment source

## 2014-02-09 ENCOUNTER — Encounter (HOSPITAL_COMMUNITY): Payer: Self-pay | Admitting: Emergency Medicine

## 2014-02-09 ENCOUNTER — Emergency Department (HOSPITAL_COMMUNITY)
Admission: EM | Admit: 2014-02-09 | Discharge: 2014-02-09 | Disposition: A | Discharge status: Home / Self Care | Payer: No Typology Code available for payment source | Attending: Emergency Medicine | Admitting: Emergency Medicine

## 2014-02-09 DIAGNOSIS — E039 Hypothyroidism, unspecified: Secondary | ICD-10-CM | POA: Insufficient documentation

## 2014-02-09 DIAGNOSIS — Z8701 Personal history of pneumonia (recurrent): Secondary | ICD-10-CM | POA: Insufficient documentation

## 2014-02-09 DIAGNOSIS — Z9089 Acquired absence of other organs: Secondary | ICD-10-CM | POA: Insufficient documentation

## 2014-02-09 DIAGNOSIS — K219 Gastro-esophageal reflux disease without esophagitis: Secondary | ICD-10-CM | POA: Insufficient documentation

## 2014-02-09 DIAGNOSIS — G43909 Migraine, unspecified, not intractable, without status migrainosus: Secondary | ICD-10-CM | POA: Insufficient documentation

## 2014-02-09 DIAGNOSIS — R109 Unspecified abdominal pain: Secondary | ICD-10-CM | POA: Insufficient documentation

## 2014-02-09 DIAGNOSIS — J449 Chronic obstructive pulmonary disease, unspecified: Secondary | ICD-10-CM | POA: Insufficient documentation

## 2014-02-09 DIAGNOSIS — G4733 Obstructive sleep apnea (adult) (pediatric): Secondary | ICD-10-CM | POA: Insufficient documentation

## 2014-02-09 DIAGNOSIS — Z8619 Personal history of other infectious and parasitic diseases: Secondary | ICD-10-CM | POA: Insufficient documentation

## 2014-02-09 DIAGNOSIS — F411 Generalized anxiety disorder: Secondary | ICD-10-CM | POA: Insufficient documentation

## 2014-02-09 DIAGNOSIS — I10 Essential (primary) hypertension: Secondary | ICD-10-CM | POA: Insufficient documentation

## 2014-02-09 DIAGNOSIS — J4489 Other specified chronic obstructive pulmonary disease: Secondary | ICD-10-CM | POA: Insufficient documentation

## 2014-02-09 DIAGNOSIS — IMO0002 Reserved for concepts with insufficient information to code with codable children: Secondary | ICD-10-CM | POA: Insufficient documentation

## 2014-02-09 DIAGNOSIS — E119 Type 2 diabetes mellitus without complications: Secondary | ICD-10-CM | POA: Insufficient documentation

## 2014-02-09 DIAGNOSIS — M129 Arthropathy, unspecified: Secondary | ICD-10-CM | POA: Insufficient documentation

## 2014-02-09 DIAGNOSIS — Z90710 Acquired absence of both cervix and uterus: Secondary | ICD-10-CM | POA: Insufficient documentation

## 2014-02-09 DIAGNOSIS — Z862 Personal history of diseases of the blood and blood-forming organs and certain disorders involving the immune mechanism: Secondary | ICD-10-CM | POA: Insufficient documentation

## 2014-02-09 DIAGNOSIS — Z9981 Dependence on supplemental oxygen: Secondary | ICD-10-CM | POA: Insufficient documentation

## 2014-02-09 DIAGNOSIS — Z79899 Other long term (current) drug therapy: Secondary | ICD-10-CM | POA: Insufficient documentation

## 2014-02-09 DIAGNOSIS — Z853 Personal history of malignant neoplasm of breast: Secondary | ICD-10-CM | POA: Insufficient documentation

## 2014-02-09 LAB — COMPREHENSIVE METABOLIC PANEL
ALT: 24 U/L (ref 0–35)
AST: 24 U/L (ref 0–37)
Albumin: 3.9 g/dL (ref 3.5–5.2)
Alkaline Phosphatase: 79 U/L (ref 39–117)
BUN: 20 mg/dL (ref 6–23)
CHLORIDE: 98 meq/L (ref 96–112)
CO2: 29 mEq/L (ref 19–32)
Calcium: 8.6 mg/dL (ref 8.4–10.5)
Creatinine, Ser: 0.7 mg/dL (ref 0.50–1.10)
GLUCOSE: 168 mg/dL — AB (ref 70–99)
Potassium: 3.6 mEq/L — ABNORMAL LOW (ref 3.7–5.3)
Sodium: 142 mEq/L (ref 137–147)
Total Protein: 7.1 g/dL (ref 6.0–8.3)

## 2014-02-09 LAB — URINALYSIS, ROUTINE W REFLEX MICROSCOPIC
Bilirubin Urine: NEGATIVE
GLUCOSE, UA: NEGATIVE mg/dL
Ketones, ur: NEGATIVE mg/dL
Leukocytes, UA: NEGATIVE
Nitrite: NEGATIVE
PROTEIN: NEGATIVE mg/dL
Specific Gravity, Urine: 1.014 (ref 1.005–1.030)
Urobilinogen, UA: 0.2 mg/dL (ref 0.0–1.0)
pH: 5 (ref 5.0–8.0)

## 2014-02-09 LAB — CBC WITH DIFFERENTIAL/PLATELET
Basophils Absolute: 0 10*3/uL (ref 0.0–0.1)
Basophils Relative: 0 % (ref 0–1)
EOS ABS: 0 10*3/uL (ref 0.0–0.7)
Eosinophils Relative: 0 % (ref 0–5)
HCT: 38.6 % (ref 36.0–46.0)
Hemoglobin: 13 g/dL (ref 12.0–15.0)
LYMPHS ABS: 1.7 10*3/uL (ref 0.7–4.0)
LYMPHS PCT: 17 % (ref 12–46)
MCH: 27.9 pg (ref 26.0–34.0)
MCHC: 33.7 g/dL (ref 30.0–36.0)
MCV: 82.8 fL (ref 78.0–100.0)
Monocytes Absolute: 0.7 10*3/uL (ref 0.1–1.0)
Monocytes Relative: 7 % (ref 3–12)
Neutro Abs: 7.7 10*3/uL (ref 1.7–7.7)
Neutrophils Relative %: 76 % (ref 43–77)
PLATELETS: 265 10*3/uL (ref 150–400)
RBC: 4.66 MIL/uL (ref 3.87–5.11)
RDW: 16.9 % — ABNORMAL HIGH (ref 11.5–15.5)
WBC: 10.1 10*3/uL (ref 4.0–10.5)

## 2014-02-09 LAB — LIPASE, BLOOD: LIPASE: 11 U/L (ref 11–59)

## 2014-02-09 LAB — URINE MICROSCOPIC-ADD ON

## 2014-02-09 LAB — I-STAT CG4 LACTIC ACID, ED: Lactic Acid, Venous: 1.45 mmol/L (ref 0.5–2.2)

## 2014-02-09 MED ORDER — HEPARIN SOD (PORK) LOCK FLUSH 100 UNIT/ML IV SOLN
500.0000 [IU] | Freq: Once | INTRAVENOUS | Status: AC
  Administered 2014-02-09: 500 [IU]
  Filled 2014-02-09: qty 5

## 2014-02-09 NOTE — ED Notes (Signed)
Pt presents by EMS with c/o left flank pain and abdominal pain. Pt has a hx of addisons disease and MS and is also currently being treated for shingles. Pt's endocrinologist wants her evaluated in the ED.

## 2014-02-09 NOTE — Discharge Instructions (Signed)
Abdominal Pain, Women °Abdominal (stomach, pelvic, or belly) pain can be caused by many things. It is important to tell your doctor: °· The location of the pain. °· Does it come and go or is it present all the time? °· Are there things that start the pain (eating certain foods, exercise)? °· Are there other symptoms associated with the pain (fever, nausea, vomiting, diarrhea)? °All of this is helpful to know when trying to find the cause of the pain. °CAUSES  °· Stomach: virus or bacteria infection, or ulcer. °· Intestine: appendicitis (inflamed appendix), regional ileitis (Crohn's disease), ulcerative colitis (inflamed colon), irritable bowel syndrome, diverticulitis (inflamed diverticulum of the colon), or cancer of the stomach or intestine. °· Gallbladder disease or stones in the gallbladder. °· Kidney disease, kidney stones, or infection. °· Pancreas infection or cancer. °· Fibromyalgia (pain disorder). °· Diseases of the female organs: °· Uterus: fibroid (non-cancerous) tumors or infection. °· Fallopian tubes: infection or tubal pregnancy. °· Ovary: cysts or tumors. °· Pelvic adhesions (scar tissue). °· Endometriosis (uterus lining tissue growing in the pelvis and on the pelvic organs). °· Pelvic congestion syndrome (female organs filling up with blood just before the menstrual period). °· Pain with the menstrual period. °· Pain with ovulation (producing an egg). °· Pain with an IUD (intrauterine device, birth control) in the uterus. °· Cancer of the female organs. °· Functional pain (pain not caused by a disease, may improve without treatment). °· Psychological pain. °· Depression. °DIAGNOSIS  °Your doctor will decide the seriousness of your pain by doing an examination. °· Blood tests. °· X-rays. °· Ultrasound. °· CT scan (computed tomography, special type of X-ray). °· MRI (magnetic resonance imaging). °· Cultures, for infection. °· Barium enema (dye inserted in the large intestine, to better view it with  X-rays). °· Colonoscopy (looking in intestine with a lighted tube). °· Laparoscopy (minor surgery, looking in abdomen with a lighted tube). °· Major abdominal exploratory surgery (looking in abdomen with a large incision). °TREATMENT  °The treatment will depend on the cause of the pain.  °· Many cases can be observed and treated at home. °· Over-the-counter medicines recommended by your caregiver. °· Prescription medicine. °· Antibiotics, for infection. °· Birth control pills, for painful periods or for ovulation pain. °· Hormone treatment, for endometriosis. °· Nerve blocking injections. °· Physical therapy. °· Antidepressants. °· Counseling with a psychologist or psychiatrist. °· Minor or major surgery. °HOME CARE INSTRUCTIONS  °· Do not take laxatives, unless directed by your caregiver. °· Take over-the-counter pain medicine only if ordered by your caregiver. Do not take aspirin because it can cause an upset stomach or bleeding. °· Try a clear liquid diet (broth or water) as ordered by your caregiver. Slowly move to a bland diet, as tolerated, if the pain is related to the stomach or intestine. °· Have a thermometer and take your temperature several times a day, and record it. °· Bed rest and sleep, if it helps the pain. °· Avoid sexual intercourse, if it causes pain. °· Avoid stressful situations. °· Keep your follow-up appointments and tests, as your caregiver orders. °· If the pain does not go away with medicine or surgery, you may try: °· Acupuncture. °· Relaxation exercises (yoga, meditation). °· Group therapy. °· Counseling. °SEEK MEDICAL CARE IF:  °· You notice certain foods cause stomach pain. °· Your home care treatment is not helping your pain. °· You need stronger pain medicine. °· You want your IUD removed. °· You feel faint or   lightheaded. °· You develop nausea and vomiting. °· You develop a rash. °· You are having side effects or an allergy to your medicine. °SEEK IMMEDIATE MEDICAL CARE IF:  °· Your  pain does not go away or gets worse. °· You have a fever. °· Your pain is felt only in portions of the abdomen. The right side could possibly be appendicitis. The left lower portion of the abdomen could be colitis or diverticulitis. °· You are passing blood in your stools (bright red or black tarry stools, with or without vomiting). °· You have blood in your urine. °· You develop chills, with or without a fever. °· You pass out. °MAKE SURE YOU:  °· Understand these instructions. °· Will watch your condition. °· Will get help right away if you are not doing well or get worse. °Document Released: 09/17/2007 Document Revised: 02/12/2012 Document Reviewed: 10/07/2009 °[employer]® Patient Information ©2014 [employer]. ° °

## 2014-02-09 NOTE — ED Provider Notes (Signed)
CSN: [id number]     Arrival date & time 02/09/14  1715 History   First MD Initiated Contact with Patient 02/09/14 1954     Chief Complaint  Patient presents with  . Flank Pain  . Abdominal Pain     (Consider location/radiation/quality/duration/timing/severity/associated sxs/prior Treatment) Patient is a 63 y.o. female presenting with flank pain and abdominal pain. The history is provided by the patient.  Flank Pain This is a recurrent problem. The current episode started more than 2 days ago. The problem occurs constantly. The problem has not changed since onset.Associated symptoms include abdominal pain. Pertinent negatives include no chest pain, no headaches and no shortness of breath. Nothing aggravates the symptoms. Nothing relieves the symptoms. She has tried nothing for the symptoms.  Abdominal Pain Associated symptoms: no chest pain and no shortness of breath     Past Medical History  Diagnosis Date  . Asthma   . Hypertension   . Shingles   . Sleep apnea   . OSA on CPAP   . Adenocarcinoma of breast 07/13/1992  . Hypothyroidism   . H/O esophageal spasm     "I can have as many as 34 TUMS before they stop"  . Radiation adverse effect     "100% blockage on left; 50% on right salivary; can drain w/massage"  . Diabetes mellitus     "diet controlled"  . Restless leg syndrome, controlled 02/19/12    "much better since being on CPAP; been on that ~ 1 month now"  . COPD (chronic obstructive pulmonary disease)   . Pneumonia     "I've had it several times"  . Chronic bronchitis   . Positive TB test     "ran deep in my father's family; went on INH for awhile; it caused migraines"  . Adrenal insufficiency     "w/secondary Addison's"  . Anemia   . GERD (gastroesophageal reflux disease)   . H/O hiatal hernia   . Upper GI bleeding 1970-1980    "had 4"  . Hepatitis B infection 1971    "in high school; after drinking after football player"  . Migraines     "much better now"  (02/19/12)  . Headache(784.0)     "when I don't eat" (02/19/12)  . Anxiety   . Arthritis   . Fibromyalgia   . Multiple sclerosis 05/1988    "dr's in [place] don't think I have it; dr's in [place] still do" (02/19/12)  . On home oxygen therapy     2 L at night  . Bruises easily   . Stress incontinence in female   . Addison's disease    Past Surgical History  Procedure Laterality Date  . Tonsillectomy and adenoidectomy  1964  . Mastectomy  2010    left  . Appendectomy  1960's  . Cholecystectomy  1980's  . Abdominal hysterectomy  1987  . Bilateral oophorectomy  1988  . Breast lumpectomy  07/13/1992    left  . Spinal cord stimulator insertion  ~ 2004  . Back surgery  ~ 2004    "chipped disk out to put in permanent stimulator"  . Band hemorrhoidectomy  1979  . Salivary gland surgery  ~ 2002    "left was 100% blocked twice; right 50%; only had one surgery"  . Laparoscopy    . Portacath placement  10/30/2012    Procedure: INSERTION PORT-A-CATH;  Surgeon: Odis Hollingshead, MD;  Location: [facility];  Service: General;  Laterality: Right;  Right Ultrasound guided  port-a-cath insertion   Family History  Problem Relation Age of Onset  . Lung cancer Paternal Uncle     smoker  . Breast cancer Cousin 28    maternal cousin  . Throat cancer Cousin     maternal cousin   History  Substance Use Topics  . Smoking status: Never Smoker   . Smokeless tobacco: Never Used  . Alcohol Use: No   OB History   Grav Para Term Preterm Abortions TAB SAB Ect Mult Living                 Review of Systems  Respiratory: Negative for shortness of breath.   Cardiovascular: Negative for chest pain.  Gastrointestinal: Positive for abdominal pain.  Genitourinary: Positive for flank pain.  Neurological: Negative for headaches.  All other systems reviewed and are negative.      Allergies  Compazine; Iohexol; Ivp dye; Lisinopril; Metformin and related; Metoclopramide hcl; Promethazine hcl; Sulfa  antibiotics; Terbinafine hcl; Vancomycin; Guaifenesin; Amlodipine; and Dilaudid  Home Medications   Current Outpatient Rx  Name  Route  Sig  Dispense  Refill  . B Complex-C (B-COMPLEX WITH VITAMIN C) tablet   Oral   Take 1 tablet by mouth daily.         . budesonide-formoterol (SYMBICORT) 160-4.5 MCG/ACT inhaler   Inhalation   Inhale 2 puffs into the lungs 2 (two) times daily.          Marland Kitchen CALCIUM PO   Oral   Take 1 tablet by mouth daily.         . clonazePAM (KLONOPIN) 1 MG tablet   Oral   Take 1 mg by mouth 3 (three) times daily.          . furosemide (LASIX) 20 MG tablet   Oral   Take 20 mg by mouth daily as needed. For fluid         . levalbuterol (XOPENEX HFA) 45 MCG/ACT inhaler   Inhalation   Inhale 2 puffs into the lungs every 4 (four) hours as needed. Shortness of breath.         . levalbuterol (XOPENEX) 1.25 MG/0.5ML nebulizer solution   Nebulization   Take 1.25 mg by nebulization every 4 (four) hours as needed for wheezing or shortness of breath.         . levothyroxine (SYNTHROID, LEVOTHROID) 75 MCG tablet   Oral   Take 75 mcg by mouth at bedtime.          . lidocaine-prilocaine (EMLA) cream   Topical   Apply 1 application topically once. Apply to [facility] site 1-2 hours prior to access as directed.         . magnesium chloride (SLOW-MAG) 64 MG TBEC   Oral   Take 1 tablet by mouth daily as needed. With lasix         . morphine (MSIR) 30 MG tablet   Oral   Take 60 mg by mouth every 4 (four) hours as needed. Pain.         . pantoprazole (PROTONIX) 40 MG tablet   Oral   Take 40 mg by mouth daily.         . potassium chloride SA (K-DUR,KLOR-CON) 20 MEQ tablet   Oral   Take 20 mEq by mouth daily as needed. Take with lasix         . predniSONE (DELTASONE) 5 MG tablet   Oral   Take 5 mg by mouth daily.          Marland Kitchen  senna (SENOKOT) 8.6 MG TABS   Oral   Take 1 tablet (8.6 mg total) by mouth 2 (two) times daily.   120 each      .  hydrOXYzine (ATARAX/VISTARIL) 50 MG tablet   Oral   Take 50-100 mg by mouth 4 (four) times daily as needed for itching. itching         . metoprolol tartrate (LOPRESSOR) 25 MG tablet   Oral   Take 12.5 mg by mouth daily. Takes an extra 12.5mg  if having heart palpitations          BP 147/80  Pulse 100  Temp(Src) 98.2 F (36.8 C) (Oral)  Resp 16  Ht 5\' 1"  (1.549 m)  Wt 178 lb (80.74 kg)  BMI 33.65 kg/m2  SpO2 94% Physical Exam  Nursing note and vitals reviewed. Constitutional: She is oriented to person, place, and time. She appears well-developed and well-nourished. No distress.  HENT:  Head: Normocephalic and atraumatic.  Eyes: EOM are normal. Pupils are equal, round, and reactive to light.  Neck: Normal range of motion. Neck supple.  Cardiovascular: Normal rate and regular rhythm.  Exam reveals no friction rub.   No murmur heard. Pulmonary/Chest: Effort normal and breath sounds normal. No respiratory distress. She has no wheezes. She has no rales.  Abdominal: Soft. She exhibits no distension. There is no tenderness. There is no rebound.  Musculoskeletal: Normal range of motion. She exhibits no edema.  Neurological: She is alert and oriented to person, place, and time. No cranial nerve deficit. She exhibits normal muscle tone. Coordination normal.  Skin: No rash noted. She is not diaphoretic.    ED Course  Procedures (including critical care time) Labs Review Labs Reviewed  COMPREHENSIVE METABOLIC PANEL - Abnormal; Notable for the following:    Potassium 3.6 (*)    Glucose, Bld 168 (*)    Total Bilirubin <0.2 (*)    All other components within normal limits  CBC WITH DIFFERENTIAL - Abnormal; Notable for the following:    RDW 16.9 (*)    All other components within normal limits  URINALYSIS, ROUTINE W REFLEX MICROSCOPIC - Abnormal; Notable for the following:    Hgb urine dipstick SMALL (*)    All other components within normal limits  URINE MICROSCOPIC-ADD ON -  Abnormal; Notable for the following:    Casts HYALINE CASTS (*)    All other components within normal limits  LIPASE, BLOOD  URINALYSIS, ROUTINE W REFLEX MICROSCOPIC  CORTISOL  I-STAT CG4 LACTIC ACID, ED   Imaging Review Ct Abdomen Pelvis Wo Contrast  02/09/2014   CLINICAL DATA:  Left-sided flank pain, left lower quadrant pain.  EXAM: CT ABDOMEN AND PELVIS WITHOUT CONTRAST  TECHNIQUE: Multidetector CT imaging of the abdomen and pelvis was performed following the standard protocol without intravenous contrast.  COMPARISON:  09/10/2023  FINDINGS: Linear opacity within the middle lobe, favor atelectasis or scarring. Normal heart size.  Organ evaluation is limited in the absence of intravenous contrast. Within this limitation, low attenuation of the liver suggest fatty infiltration. Punctate hepatic and splenic calcifications are in keeping with granuloma.  Cholecystectomy. Dilatation of the common bile duct up to 8 mm. There may be mild mucosal thickening as well. Fatty atrophy of the pancreas. No peripancreatic fat stranding. Unremarkable adrenal glands.  Nonobstructing lower pole right larger than left renal stones. No hydroureteronephrosis or ureteral calculi identified.  Moderate stool burden. Appendix not identified. No right lower quadrant inflammation. No bowel obstruction. No free intraperitoneal air or  fluid. No lymphadenopathy.  Normal caliber aorta and branch vessels with mild scattered atherosclerotic disease.  Thin walled bladder.  Absent uterus.  No adnexal mass.  Right posterior subcutaneous battery pack with neural stimulator wires extending superiorly, tips not seen. Curvature of the spine. Degenerative changes at L4-5.  IMPRESSION: Nonobstructing renal stones. No hydroureteronephrosis or ureteral calculi identified.  Hepatic steatosis.  Extrahepatic biliary ductal dilatation is similar to prior. Mild thickening of the common bile duct wall may reflect sequelae of prior inflammation. Correlate  with LFTs and ERCP if concerned for biliary pathology.   Electronically Signed   By: Carlos Levering M.D.   On: 02/09/2014 23:02     EKG Interpretation None      MDM   Final diagnoses:  Abdominal pain    35F presents with abdominal pain and flank pain. She is also feeling jittery and aching all over, which she states is similar to prior Addison's exacerbation. Patient did not elaborate on her abdominal pain much and could not be redirected to tell me about it. She spoke mainly of her recent maladies including multiple episodes of bronchitis and her recurrent shingles.  On exam, lungs clear. Mild diffuse abdominal pain without rebound or guarding. Based on her exam, will scan and check labs. Review of records showed prior Addison's exacerbation with lots of N/V, so will check cortisol and basic labs. She stated decreased PO intake, but no frank vomiting. Labs normal. CT normal. I spoke with patient about starting stress dose steroids for her Addison's, but she stated her PCP has already done so. Not findings warrant admission. Patient stable for discharge, instructed to f/u with PCP.  Osvaldo Shipper, MD 02/09/14 [phone]

## 2014-02-10 LAB — CORTISOL: CORTISOL PLASMA: 12.8 ug/dL

## 2014-04-01 ENCOUNTER — Other Ambulatory Visit: Payer: Self-pay

## 2014-04-01 ENCOUNTER — Ambulatory Visit (HOSPITAL_BASED_OUTPATIENT_CLINIC_OR_DEPARTMENT_OTHER): Payer: No Typology Code available for payment source

## 2014-04-01 VITALS — BP 167/75 | HR 82

## 2014-04-01 DIAGNOSIS — Z95828 Presence of other vascular implants and grafts: Secondary | ICD-10-CM

## 2014-04-01 DIAGNOSIS — Z853 Personal history of malignant neoplasm of breast: Secondary | ICD-10-CM

## 2014-04-01 DIAGNOSIS — Z452 Encounter for adjustment and management of vascular access device: Secondary | ICD-10-CM

## 2014-04-01 MED ORDER — SODIUM CHLORIDE 0.9 % IJ SOLN
10.0000 mL | INTRAMUSCULAR | Status: DC | PRN
  Administered 2014-04-01: 10 mL via INTRAVENOUS
  Filled 2014-04-01: qty 10

## 2014-04-01 MED ORDER — HEPARIN SOD (PORK) LOCK FLUSH 100 UNIT/ML IV SOLN
500.0000 [IU] | Freq: Once | INTRAVENOUS | Status: AC
  Administered 2014-04-01: 500 [IU] via INTRAVENOUS
  Filled 2014-04-01: qty 5

## 2014-04-13 ENCOUNTER — Other Ambulatory Visit: Payer: Self-pay | Admitting: Gastroenterology

## 2014-04-13 DIAGNOSIS — R1310 Dysphagia, unspecified: Secondary | ICD-10-CM

## 2014-04-16 ENCOUNTER — Ambulatory Visit
Admission: RE | Admit: 2014-04-16 | Discharge: 2014-04-16 | Disposition: A | Discharge status: Home / Self Care | Payer: No Typology Code available for payment source | Source: Ambulatory Visit | Attending: Gastroenterology | Admitting: Gastroenterology

## 2014-04-16 ENCOUNTER — Other Ambulatory Visit: Payer: Self-pay | Admitting: Gastroenterology

## 2014-04-16 DIAGNOSIS — R1310 Dysphagia, unspecified: Secondary | ICD-10-CM

## 2014-04-16 NOTE — Addendum Note (Signed)
Addended by: Teena Irani on: 04/16/2014 06:48 PM   Modules accepted: Orders

## 2014-04-17 ENCOUNTER — Encounter (HOSPITAL_COMMUNITY)
Admission: RE | Disposition: A | Discharge status: Home / Self Care | Payer: Self-pay | Source: Ambulatory Visit | Attending: Gastroenterology

## 2014-04-17 ENCOUNTER — Ambulatory Visit (HOSPITAL_COMMUNITY)
Admission: RE | Admit: 2014-04-17 | Discharge: 2014-04-17 | Disposition: A | Discharge status: Home / Self Care | Payer: No Typology Code available for payment source | Source: Ambulatory Visit | Attending: Gastroenterology | Admitting: Gastroenterology

## 2014-04-17 ENCOUNTER — Encounter (HOSPITAL_COMMUNITY): Payer: Self-pay

## 2014-04-17 ENCOUNTER — Encounter (HOSPITAL_COMMUNITY): Payer: No Typology Code available for payment source | Admitting: Certified Registered Nurse Anesthetist

## 2014-04-17 ENCOUNTER — Ambulatory Visit (HOSPITAL_COMMUNITY): Payer: No Typology Code available for payment source | Admitting: Certified Registered Nurse Anesthetist

## 2014-04-17 DIAGNOSIS — I10 Essential (primary) hypertension: Secondary | ICD-10-CM | POA: Insufficient documentation

## 2014-04-17 DIAGNOSIS — E039 Hypothyroidism, unspecified: Secondary | ICD-10-CM | POA: Insufficient documentation

## 2014-04-17 DIAGNOSIS — F411 Generalized anxiety disorder: Secondary | ICD-10-CM | POA: Insufficient documentation

## 2014-04-17 DIAGNOSIS — J449 Chronic obstructive pulmonary disease, unspecified: Secondary | ICD-10-CM | POA: Insufficient documentation

## 2014-04-17 DIAGNOSIS — R195 Other fecal abnormalities: Secondary | ICD-10-CM | POA: Insufficient documentation

## 2014-04-17 DIAGNOSIS — K219 Gastro-esophageal reflux disease without esophagitis: Secondary | ICD-10-CM | POA: Insufficient documentation

## 2014-04-17 DIAGNOSIS — K449 Diaphragmatic hernia without obstruction or gangrene: Secondary | ICD-10-CM | POA: Insufficient documentation

## 2014-04-17 DIAGNOSIS — Z853 Personal history of malignant neoplasm of breast: Secondary | ICD-10-CM | POA: Insufficient documentation

## 2014-04-17 DIAGNOSIS — E2749 Other adrenocortical insufficiency: Secondary | ICD-10-CM | POA: Insufficient documentation

## 2014-04-17 DIAGNOSIS — G35 Multiple sclerosis: Secondary | ICD-10-CM | POA: Insufficient documentation

## 2014-04-17 DIAGNOSIS — E669 Obesity, unspecified: Secondary | ICD-10-CM

## 2014-04-17 DIAGNOSIS — K208 Other esophagitis without bleeding: Secondary | ICD-10-CM | POA: Insufficient documentation

## 2014-04-17 DIAGNOSIS — E119 Type 2 diabetes mellitus without complications: Secondary | ICD-10-CM | POA: Insufficient documentation

## 2014-04-17 DIAGNOSIS — G2581 Restless legs syndrome: Secondary | ICD-10-CM | POA: Insufficient documentation

## 2014-04-17 DIAGNOSIS — G4733 Obstructive sleep apnea (adult) (pediatric): Secondary | ICD-10-CM | POA: Insufficient documentation

## 2014-04-17 DIAGNOSIS — J4489 Other specified chronic obstructive pulmonary disease: Secondary | ICD-10-CM | POA: Insufficient documentation

## 2014-04-17 DIAGNOSIS — Z8619 Personal history of other infectious and parasitic diseases: Secondary | ICD-10-CM | POA: Insufficient documentation

## 2014-04-17 HISTORY — PX: SAVORY DILATION: SHX5439

## 2014-04-17 HISTORY — PX: ESOPHAGOGASTRODUODENOSCOPY: SHX5428

## 2014-04-17 LAB — GLUCOSE, CAPILLARY: Glucose-Capillary: 113 mg/dL — ABNORMAL HIGH (ref 70–99)

## 2014-04-17 SURGERY — EGD (ESOPHAGOGASTRODUODENOSCOPY)
Anesthesia: Monitor Anesthesia Care

## 2014-04-17 MED ORDER — LIDOCAINE HCL (CARDIAC) 20 MG/ML IV SOLN
INTRAVENOUS | Status: AC
  Filled 2014-04-17: qty 5

## 2014-04-17 MED ORDER — SODIUM CHLORIDE 0.9 % IV SOLN: INTRAVENOUS | Status: DC

## 2014-04-17 MED ORDER — PROPOFOL 10 MG/ML IV BOLUS
INTRAVENOUS | Status: AC
  Filled 2014-04-17: qty 20

## 2014-04-17 MED ORDER — LACTATED RINGERS IV SOLN
INTRAVENOUS | Status: DC | PRN
  Administered 2014-04-17: 09:00:00 via INTRAVENOUS

## 2014-04-17 MED ORDER — LIDOCAINE HCL (CARDIAC) 20 MG/ML IV SOLN
INTRAVENOUS | Status: DC | PRN
  Administered 2014-04-17: 80 mg via INTRAVENOUS

## 2014-04-17 MED ORDER — BUTAMBEN-TETRACAINE-BENZOCAINE 2-2-14 % EX AERO
INHALATION_SPRAY | CUTANEOUS | Status: DC | PRN
  Administered 2014-04-17: 1 via TOPICAL

## 2014-04-17 MED ORDER — GLYCOPYRROLATE 0.2 MG/ML IJ SOLN
INTRAMUSCULAR | Status: DC | PRN
  Administered 2014-04-17: 0.2 mg via INTRAVENOUS

## 2014-04-17 MED ORDER — GLYCOPYRROLATE 0.2 MG/ML IJ SOLN
INTRAMUSCULAR | Status: AC
  Filled 2014-04-17: qty 1

## 2014-04-17 MED ORDER — MIDAZOLAM HCL 5 MG/5ML IJ SOLN
INTRAMUSCULAR | Status: DC | PRN
  Administered 2014-04-17: 2 mg via INTRAVENOUS

## 2014-04-17 MED ORDER — MIDAZOLAM HCL 2 MG/2ML IJ SOLN
INTRAMUSCULAR | Status: AC
  Filled 2014-04-17: qty 2

## 2014-04-17 MED ORDER — ONDANSETRON HCL 4 MG/2ML IJ SOLN
INTRAMUSCULAR | Status: DC | PRN
Start: 2014-04-17 — End: 2014-04-17
  Administered 2014-04-17: 4 mg via INTRAVENOUS

## 2014-04-17 MED ORDER — LACTATED RINGERS IV SOLN
INTRAVENOUS | Status: DC
  Administered 2014-04-17: 1000 mL via INTRAVENOUS

## 2014-04-17 MED ORDER — KETAMINE HCL 10 MG/ML IJ SOLN
INTRAMUSCULAR | Status: DC | PRN
  Administered 2014-04-17 (×2): 20 mg via INTRAVENOUS

## 2014-04-17 MED ORDER — ONDANSETRON HCL 4 MG/2ML IJ SOLN
INTRAMUSCULAR | Status: AC
  Filled 2014-04-17: qty 2

## 2014-04-17 MED ORDER — PROPOFOL 10 MG/ML IV BOLUS
INTRAVENOUS | Status: DC | PRN
  Administered 2014-04-17 (×2): 20 mg via INTRAVENOUS

## 2014-04-17 MED ORDER — PROPOFOL INFUSION 10 MG/ML OPTIME
INTRAVENOUS | Status: DC | PRN
  Administered 2014-04-17: 50 ug/kg/min via INTRAVENOUS

## 2014-04-17 NOTE — Anesthesia Preprocedure Evaluation (Signed)
Anesthesia Evaluation  Patient identified by MRN, date of birth, ID band Patient awake    Reviewed: Allergy & Precautions, H&P , NPO status , Patient's Chart, lab work & pertinent test results, reviewed documented beta blocker date and time   Airway Mallampati: III TM Distance: >3 FB Neck ROM: full    Dental no notable dental hx. (+) Teeth Intact, Dental Advisory Given   Pulmonary asthma , sleep apnea, Continuous Positive Airway Pressure Ventilation and Oxygen sleep apnea , COPD COPD inhaler,  Chronic bronchitis breath sounds clear to auscultation  Pulmonary exam normal       Cardiovascular Exercise Tolerance: Good hypertension, Pt. on home beta blockers negative cardio ROS  Rhythm:regular Rate:Normal     Neuro/Psych  Headaches, negative neurological ROS  negative psych ROS   GI/Hepatic negative GI ROS, hiatal hernia, GERD-  Medicated and Controlled,(+) Hepatitis -, B  Endo/Other  diabetes, Well Controlled, Type 2, Oral Hypoglycemic AgentsHypothyroidism Adrenal insufficiency with Addison's.  No meds for DM.  Renal/GU negative Renal ROS  negative genitourinary   Musculoskeletal  (+) Fibromyalgia -  Abdominal   Peds  Hematology negative hematology ROS (+)   Anesthesia Other Findings   Reproductive/Obstetrics negative OB ROS                           Anesthesia Physical  Anesthesia Plan  ASA: III  Anesthesia Plan: MAC   Post-op Pain Management:    Induction:   Airway Management Planned: Natural Airway  Additional Equipment:   Intra-op Plan:   Post-operative Plan:   Informed Consent: I have reviewed the patients History and Physical, chart, labs and discussed the procedure including the risks, benefits and alternatives for the proposed anesthesia with the patient or authorized representative who has indicated his/her understanding and acceptance.   Dental Advisory Given  Plan  Discussed with: CRNA and Surgeon  Anesthesia Plan Comments:         Anesthesia Quick Evaluation

## 2014-04-17 NOTE — Progress Notes (Signed)
Attempted to access left chest Port-A-Cath with #20 Gauge HPN.   I could only feel metal with tip of HPN while attempting to access.   Pt states,  "It flips"   Unable to access it and they are ready for procedure in Endoscopy.   PIV started in left upper outer forearm without difficulty.

## 2014-04-17 NOTE — H&P (Signed)
[facility] Admission History & Physical  Chief Complaint: Dysphagia/odynophagia HPI: Daisy [name] is an 63 y.o. white female.  Who presents with a several week history of progressive dysphagia for solids greater than liquids with some odynophagia. Barium swallow showed obstruction of a barium tablet at the GE junction. She presents for EGD and possible dilatation today.  Past Medical History  Diagnosis Date  . Asthma   . Hypertension   . Shingles   . Sleep apnea   . OSA on CPAP   . Adenocarcinoma of breast 07/13/1992  . Hypothyroidism   . H/O esophageal spasm     "I can have as many as 46 TUMS before they stop"  . Radiation adverse effect     "100% blockage on left; 50% on right salivary; can drain w/massage"  . Diabetes mellitus     "diet controlled"  . Restless leg syndrome, controlled 02/19/12    "much better since being on CPAP; been on that ~ 1 month now"  . COPD (chronic obstructive pulmonary disease)   . Pneumonia     "I've had it several times"  . Chronic bronchitis   . Positive TB test     "ran deep in my father's family; went on INH for awhile; it caused migraines"  . Adrenal insufficiency     "w/secondary Addison's"  . Anemia   . GERD (gastroesophageal reflux disease)   . H/O hiatal hernia   . Upper GI bleeding 1970-1980    "had 4"  . Hepatitis B infection 1971    "in high school; after drinking after football player"  . Migraines     "much better now" (02/19/12)  . Headache(784.0)     "when I don't eat" (02/19/12)  . Anxiety   . Arthritis   . Fibromyalgia   . Multiple sclerosis 05/1988    "dr's in [place] don't think I have it; dr's in [place] still do" (02/19/12)  . On home oxygen therapy     2 L at night  . Bruises easily   . Stress incontinence in female   . Addison's disease     Past Surgical History  Procedure Laterality Date  . Tonsillectomy and adenoidectomy  1964  . Mastectomy  2010    left  . Appendectomy  1960's  .  Cholecystectomy  1980's  . Abdominal hysterectomy  1987  . Bilateral oophorectomy  1988  . Breast lumpectomy  07/13/1992    left  . Spinal cord stimulator insertion  ~ 2004  . Back surgery  ~ 2004    "chipped disk out to put in permanent stimulator"  . Band hemorrhoidectomy  1979  . Salivary gland surgery  ~ 2002    "left was 100% blocked twice; right 50%; only had one surgery"  . Laparoscopy    . Portacath placement  10/30/2012    Procedure: INSERTION PORT-A-CATH;  Surgeon: Odis Hollingshead, MD;  Location: [facility];  Service: General;  Laterality: Right;  Right Ultrasound guided port-a-cath insertion    Medications Prior to Admission  Medication Sig Dispense Refill  . clonazePAM (KLONOPIN) 1 MG tablet Take 1 mg by mouth 3 (three) times daily.       Marland Kitchen esomeprazole (NEXIUM) 20 MG capsule Take 20 mg by mouth daily at 12 noon.      . furosemide (LASIX) 20 MG tablet Take 20 mg by mouth daily as needed. For fluid      . hydrOXYzine (ATARAX/VISTARIL) 50 MG tablet Take 50-100  mg by mouth 4 (four) times daily as needed for itching. itching      . levothyroxine (SYNTHROID, LEVOTHROID) 75 MCG tablet Take 75 mcg by mouth at bedtime.       . magnesium chloride (SLOW-MAG) 64 MG TBEC Take 1 tablet by mouth daily as needed. With lasix      . metoprolol tartrate (LOPRESSOR) 25 MG tablet Take 12.5 mg by mouth daily. Takes an extra 12.5mg  if having heart palpitations      . morphine (MSIR) 30 MG tablet Take 60 mg by mouth every 4 (four) hours as needed. Pain.      . potassium chloride SA (K-DUR,KLOR-CON) 20 MEQ tablet Take 20 mEq by mouth daily as needed. Take with lasix      . predniSONE (DELTASONE) 5 MG tablet Take 5 mg by mouth daily.       Marland Kitchen senna (SENOKOT) 8.6 MG TABS Take 1 tablet (8.6 mg total) by mouth 2 (two) times daily.  120 each    . B Complex-C (B-COMPLEX WITH VITAMIN C) tablet Take 1 tablet by mouth daily.      . budesonide-formoterol (SYMBICORT) 160-4.5 MCG/ACT inhaler Inhale 2 puffs into  the lungs 2 (two) times daily.       Marland Kitchen CALCIUM PO Take 1 tablet by mouth daily.      Marland Kitchen levalbuterol (XOPENEX HFA) 45 MCG/ACT inhaler Inhale 2 puffs into the lungs every 4 (four) hours as needed. Shortness of breath.      . levalbuterol (XOPENEX) 1.25 MG/0.5ML nebulizer solution Take 1.25 mg by nebulization every 4 (four) hours as needed for wheezing or shortness of breath.      . lidocaine-prilocaine (EMLA) cream Apply 1 application topically once. Apply to [facility] site 1-2 hours prior to access as directed.      . pantoprazole (PROTONIX) 40 MG tablet Take 40 mg by mouth daily.        Allergies:  Allergies  Allergen Reactions  . Compazine Other (See Comments)    Hyper and shaky  . Iohexol      Desc: ANAPHYLACTIC REACTION!-kmr-10/16/05   . Ivp Dye [Iodinated Diagnostic Agents] Anaphylaxis and Other (See Comments)    Fever and blood clot  . Lisinopril Cough  . Metformin And Related     Migraine   . Metoclopramide Hcl Other (See Comments)    Hyper, shaky  . Promethazine Hcl Other (See Comments)    Hyper,,,,shaky  . Sulfa Antibiotics Swelling    Hallucinate   . Terbinafine Hcl Other (See Comments)    Throat closed up.  . Vancomycin Swelling  . Guaifenesin Other (See Comments)    hyper  . Amlodipine Swelling  . Dilaudid [Hydromorphone Hcl] Nausea And Vomiting    Headaches     Family History  Problem Relation Age of Onset  . Lung cancer Paternal Uncle     smoker  . Breast cancer Cousin 8    maternal cousin  . Throat cancer Cousin     maternal cousin    Social History:  reports that she has never smoked. She has never used smokeless tobacco. She reports that she does not drink alcohol or use illicit drugs.  Review of Systems: negative except as above   Blood pressure 130/63, pulse 72, temperature 98.3 F (36.8 C), temperature source Oral, resp. rate 16, SpO2 95.00%. Head: Normocephalic, without obvious abnormality, atraumatic Neck: no adenopathy, no carotid bruit, no  JVD, supple, symmetrical, trachea midline and thyroid not enlarged, symmetric, no tenderness/mass/nodules Resp:  clear to auscultation bilaterally Cardio: regular rate and rhythm, S1, S2 normal, no murmur, click, rub or gallop GI: Abdomen soft nondistended with normoactive bowel sounds. No hepatosplenomegaly, mass or guarding Extremities: extremities normal, atraumatic, no cyanosis or edema  No results found for this or any previous visit (from the past 48 hour(s)). Dg Esophagus  04/16/2014   CLINICAL DATA:  Dysphagia  EXAM: ESOPHOGRAM/BARIUM SWALLOW  TECHNIQUE: Single contrast examination was performed using  thin barium.  FLUOROSCOPY TIME:  2 min 24 seconds  COMPARISON:  CT abdomen pelvis of 02/09/2014  FINDINGS: A single contrast study was performed. Rapid sequence spot films over the cervical esophagus show the swallowing mechanism to be unremarkable. There are moderate tertiary contractions in the mid and distal esophagus present. No reflux could be demonstrated. There does appear to be a small hiatal hernia present. A barium pill was given at the end of the study which did lodge in the distal esophagus. The patient was given additional water and barium and the pill remained within the distal esophagus indicating a short segment distal esophageal stricture.  IMPRESSION: 1. Barium pill lodges in the distal esophagus consistent with a short segment distal esophageal stricture. 2. Moderate tertiary contractions in the mid and distal esophagus. 3. Small hiatal hernia.   Electronically Signed   By: Ivar Drape M.D.   On: 04/16/2014 12:26    Assessment: Dysphagia/odynophagia with probable esophageal stricture. Plan: We'll proceed with EGD and dilatation if appropriate.  Missy Sabins 04/17/2014, 8:08 AM

## 2014-04-17 NOTE — Anesthesia Postprocedure Evaluation (Signed)
  Anesthesia Post-op Note  Patient: Daisy [name]  Procedure(s) Performed: Procedure(s) (LRB): ESOPHAGOGASTRODUODENOSCOPY (EGD) (N/A) SAVORY DILATION (N/A)  Patient Location: PACU  Anesthesia Type: MAC  Level of Consciousness: awake and alert   Airway and Oxygen Therapy: Patient Spontanous Breathing  Post-op Pain: mild  Post-op Assessment: Post-op Vital signs reviewed, Patient's Cardiovascular Status Stable, Respiratory Function Stable, Patent Airway and No signs of Nausea or vomiting  Last Vitals:  Filed Vitals:   04/17/14 0945  BP: 139/65  Pulse: 73  Temp:   Resp: 15    Post-op Vital Signs: stable   Complications: No apparent anesthesia complications

## 2014-04-17 NOTE — Discharge Instructions (Signed)

## 2014-04-17 NOTE — Transfer of Care (Signed)
Immediate Anesthesia Transfer of Care Note  Patient: Daisy [name]  Procedure(s) Performed: Procedure(s) (LRB): ESOPHAGOGASTRODUODENOSCOPY (EGD) (N/A) SAVORY DILATION (N/A)  Patient Location: PACU  Anesthesia Type: MAC  Level of Consciousness: sedated, patient cooperative and responds to stimulation  Airway & Oxygen Therapy: Patient Spontanous Breathing and Patient connected to face mask oxgen  Post-op Assessment: Report given to PACU RN and Post -op Vital signs reviewed and stable  Post vital signs: Reviewed and stable  Complications: No apparent anesthesia complications

## 2014-04-17 NOTE — Op Note (Signed)
[facility] [place], [address]   ENDOSCOPY PROCEDURE REPORT  PATIENT: Daisy [name], Daisy [name]  MR#: [id number] BIRTHDATE: 16-Jun-1951 , 62  yrs. old GENDER: Female ENDOSCOPIST:Freddy Spadafora Amedeo Plenty, MD REFERRED BY: PROCEDURE DATE:  04/17/2014 PROCEDURE: ASA CLASS: INDICATIONS:  dysphagia, odynophagia, MEDICATION:    propofol per general anesthesia TOPICAL ANESTHETIC:    Cetacaine  DESCRIPTION OF PROCEDURE:   esophagus: Strips of white exudate from 25-35 cm possibly partly representing residual barium from barium swallow yesterday. A discrete erosion with Central furrowat the GE junction possibly representing A. esophageal ulcer. Lower esophageal sphincter with slight resistance to passage of the scope although no definite stricture seen. A 12-15 mm esophageal balloon dilating catheter was inflated to 15 mm for 1 minute and deflated without any visible blood  Stomach: Prepyloric deformity as an image 003 with small stellate ulcer scar Duodenum: Normal     COMPLICATIONS: None  ENDOSCOPIC IMPRESSION:next erosive esophagitis with possible small distal ulcer and possible stricture, carefully dilated with a balloon catheter.  RECOMMENDATIONS:increase PPI to full double dose. Followup in clinic in 3 or 4 weeks.    _______________________________ Lorrin MaisTeena Irani, MD 04/17/2014 9:09 AM

## 2014-04-20 ENCOUNTER — Encounter (HOSPITAL_COMMUNITY): Payer: Self-pay | Admitting: Gastroenterology

## 2014-04-20 ENCOUNTER — Other Ambulatory Visit (INDEPENDENT_AMBULATORY_CARE_PROVIDER_SITE_OTHER): Payer: Self-pay

## 2014-04-20 MED ORDER — ONDANSETRON 4 MG PO TBDP: 4.0000 mg | ORAL_TABLET | Freq: Four times a day (QID) | ORAL | Status: DC | PRN

## 2014-04-21 ENCOUNTER — Encounter (INDEPENDENT_AMBULATORY_CARE_PROVIDER_SITE_OTHER): Payer: Self-pay | Admitting: General Surgery

## 2014-04-21 ENCOUNTER — Ambulatory Visit (INDEPENDENT_AMBULATORY_CARE_PROVIDER_SITE_OTHER): Payer: No Typology Code available for payment source | Admitting: General Surgery

## 2014-04-21 VITALS — BP 134/74 | HR 78 | Temp 97.4°F | Ht 61.0 in | Wt 165.0 lb

## 2014-04-21 DIAGNOSIS — Z452 Encounter for adjustment and management of vascular access device: Secondary | ICD-10-CM

## 2014-04-21 NOTE — Patient Instructions (Signed)
The Port-A-Cath is in good position. It withdraws blood and flushes well.

## 2014-04-21 NOTE — Progress Notes (Signed)
Subjective:     Patient ID: Daisy [name], female   DOB: 1951-03-05, 63 y.o.   MRN: [id number]  HPI  She comes in today stating they had problems recently accessing her Port-A-Cath. She was told by the nurse tried to access it that she kept having "metal.". She's been having routine flushes about every 8 weeks without difficulty.   Review of Daisy [name] has been having dysphagia. She underwent endoscopy and that is when they had trouble accessing the Port-A-Cath.     Objective:   Physical Exam Gen.-well-developed, well-nourished in no acute distress. She is very pleasant and cooperative.  Chest-Port-A-Cath is palpable in the right upper chest wall. Nipples on anterior Port-A-Cath are palpable. I sterilely prepped this area. I accessed the Port-A-Cath and was able to easily withdraw blood and flush it.  Left chest wall demonstrates a well-healed scar with no nodularity.    Assessment:     Port-A-Cath is in good position. It was cross blood and flush as well. No evidence of "flipping.".     Plan:     Return visit as needed.

## 2014-05-17 ENCOUNTER — Encounter (HOSPITAL_BASED_OUTPATIENT_CLINIC_OR_DEPARTMENT_OTHER): Payer: Self-pay | Admitting: Emergency Medicine

## 2014-05-17 ENCOUNTER — Emergency Department (HOSPITAL_BASED_OUTPATIENT_CLINIC_OR_DEPARTMENT_OTHER): Payer: No Typology Code available for payment source

## 2014-05-17 ENCOUNTER — Inpatient Hospital Stay (HOSPITAL_BASED_OUTPATIENT_CLINIC_OR_DEPARTMENT_OTHER)
Admission: EM | Admit: 2014-05-17 | Discharge: 2014-05-20 | DRG: 690 | Disposition: A | Discharge status: Home / Self Care | Payer: No Typology Code available for payment source | Attending: Internal Medicine | Admitting: Internal Medicine

## 2014-05-17 DIAGNOSIS — G2581 Restless legs syndrome: Secondary | ICD-10-CM | POA: Diagnosis present

## 2014-05-17 DIAGNOSIS — R109 Unspecified abdominal pain: Secondary | ICD-10-CM | POA: Diagnosis present

## 2014-05-17 DIAGNOSIS — Z881 Allergy status to other antibiotic agents status: Secondary | ICD-10-CM

## 2014-05-17 DIAGNOSIS — J4489 Other specified chronic obstructive pulmonary disease: Secondary | ICD-10-CM | POA: Diagnosis present

## 2014-05-17 DIAGNOSIS — N3941 Urge incontinence: Secondary | ICD-10-CM | POA: Diagnosis present

## 2014-05-17 DIAGNOSIS — R112 Nausea with vomiting, unspecified: Secondary | ICD-10-CM

## 2014-05-17 DIAGNOSIS — E079 Disorder of thyroid, unspecified: Secondary | ICD-10-CM

## 2014-05-17 DIAGNOSIS — I10 Essential (primary) hypertension: Secondary | ICD-10-CM | POA: Diagnosis present

## 2014-05-17 DIAGNOSIS — R1032 Left lower quadrant pain: Secondary | ICD-10-CM

## 2014-05-17 DIAGNOSIS — K219 Gastro-esophageal reflux disease without esophagitis: Secondary | ICD-10-CM | POA: Diagnosis present

## 2014-05-17 DIAGNOSIS — Z853 Personal history of malignant neoplasm of breast: Secondary | ICD-10-CM | POA: Diagnosis not present

## 2014-05-17 DIAGNOSIS — IMO0001 Reserved for inherently not codable concepts without codable children: Secondary | ICD-10-CM | POA: Diagnosis present

## 2014-05-17 DIAGNOSIS — F411 Generalized anxiety disorder: Secondary | ICD-10-CM | POA: Diagnosis present

## 2014-05-17 DIAGNOSIS — IMO0002 Reserved for concepts with insufficient information to code with codable children: Secondary | ICD-10-CM

## 2014-05-17 DIAGNOSIS — E039 Hypothyroidism, unspecified: Secondary | ICD-10-CM | POA: Diagnosis present

## 2014-05-17 DIAGNOSIS — K224 Dyskinesia of esophagus: Secondary | ICD-10-CM | POA: Diagnosis present

## 2014-05-17 DIAGNOSIS — Z9010 Acquired absence of unspecified breast and nipple: Secondary | ICD-10-CM | POA: Diagnosis not present

## 2014-05-17 DIAGNOSIS — E2749 Other adrenocortical insufficiency: Secondary | ICD-10-CM | POA: Diagnosis present

## 2014-05-17 DIAGNOSIS — J449 Chronic obstructive pulmonary disease, unspecified: Secondary | ICD-10-CM | POA: Diagnosis present

## 2014-05-17 DIAGNOSIS — N10 Acute tubulo-interstitial nephritis: Principal | ICD-10-CM

## 2014-05-17 DIAGNOSIS — Z79899 Other long term (current) drug therapy: Secondary | ICD-10-CM | POA: Diagnosis not present

## 2014-05-17 DIAGNOSIS — Z803 Family history of malignant neoplasm of breast: Secondary | ICD-10-CM | POA: Diagnosis not present

## 2014-05-17 DIAGNOSIS — M129 Arthropathy, unspecified: Secondary | ICD-10-CM | POA: Diagnosis present

## 2014-05-17 DIAGNOSIS — N200 Calculus of kidney: Secondary | ICD-10-CM | POA: Diagnosis present

## 2014-05-17 DIAGNOSIS — N390 Urinary tract infection, site not specified: Secondary | ICD-10-CM

## 2014-05-17 DIAGNOSIS — N12 Tubulo-interstitial nephritis, not specified as acute or chronic: Secondary | ICD-10-CM

## 2014-05-17 DIAGNOSIS — Z9981 Dependence on supplemental oxygen: Secondary | ICD-10-CM | POA: Diagnosis not present

## 2014-05-17 DIAGNOSIS — Z888 Allergy status to other drugs, medicaments and biological substances status: Secondary | ICD-10-CM

## 2014-05-17 DIAGNOSIS — Z87442 Personal history of urinary calculi: Secondary | ICD-10-CM

## 2014-05-17 DIAGNOSIS — G8929 Other chronic pain: Secondary | ICD-10-CM | POA: Diagnosis present

## 2014-05-17 DIAGNOSIS — Z885 Allergy status to narcotic agent status: Secondary | ICD-10-CM

## 2014-05-17 DIAGNOSIS — Z9089 Acquired absence of other organs: Secondary | ICD-10-CM | POA: Diagnosis not present

## 2014-05-17 DIAGNOSIS — E119 Type 2 diabetes mellitus without complications: Secondary | ICD-10-CM | POA: Diagnosis present

## 2014-05-17 DIAGNOSIS — G4733 Obstructive sleep apnea (adult) (pediatric): Secondary | ICD-10-CM | POA: Diagnosis present

## 2014-05-17 DIAGNOSIS — Z801 Family history of malignant neoplasm of trachea, bronchus and lung: Secondary | ICD-10-CM

## 2014-05-17 DIAGNOSIS — M549 Dorsalgia, unspecified: Secondary | ICD-10-CM

## 2014-05-17 DIAGNOSIS — G35 Multiple sclerosis: Secondary | ICD-10-CM | POA: Diagnosis present

## 2014-05-17 DIAGNOSIS — E271 Primary adrenocortical insufficiency: Secondary | ICD-10-CM

## 2014-05-17 LAB — CBC WITH DIFFERENTIAL/PLATELET
BASOS ABS: 0 10*3/uL (ref 0.0–0.1)
Basophils Relative: 0 % (ref 0–1)
Eosinophils Absolute: 0.2 10*3/uL (ref 0.0–0.7)
Eosinophils Relative: 2 % (ref 0–5)
HCT: 38.2 % (ref 36.0–46.0)
Hemoglobin: 12.6 g/dL (ref 12.0–15.0)
LYMPHS PCT: 31 % (ref 12–46)
Lymphs Abs: 2.7 10*3/uL (ref 0.7–4.0)
MCH: 29.2 pg (ref 26.0–34.0)
MCHC: 33 g/dL (ref 30.0–36.0)
MCV: 88.4 fL (ref 78.0–100.0)
MONOS PCT: 9 % (ref 3–12)
Monocytes Absolute: 0.8 10*3/uL (ref 0.1–1.0)
NEUTROS ABS: 5.2 10*3/uL (ref 1.7–7.7)
Neutrophils Relative %: 58 % (ref 43–77)
Platelets: 191 10*3/uL (ref 150–400)
RBC: 4.32 MIL/uL (ref 3.87–5.11)
RDW: 14.5 % (ref 11.5–15.5)
WBC: 9 10*3/uL (ref 4.0–10.5)

## 2014-05-17 LAB — URINALYSIS, ROUTINE W REFLEX MICROSCOPIC
Glucose, UA: NEGATIVE mg/dL
Ketones, ur: 15 mg/dL — AB
NITRITE: POSITIVE — AB
Protein, ur: 30 mg/dL — AB
Specific Gravity, Urine: 1.033 — ABNORMAL HIGH (ref 1.005–1.030)
UROBILINOGEN UA: 1 mg/dL (ref 0.0–1.0)
pH: 7 (ref 5.0–8.0)

## 2014-05-17 LAB — COMPREHENSIVE METABOLIC PANEL
ALT: 13 U/L (ref 0–35)
AST: 17 U/L (ref 0–37)
Albumin: 3.7 g/dL (ref 3.5–5.2)
Alkaline Phosphatase: 73 U/L (ref 39–117)
BUN: 18 mg/dL (ref 6–23)
CHLORIDE: 103 meq/L (ref 96–112)
CO2: 32 meq/L (ref 19–32)
Calcium: 8.9 mg/dL (ref 8.4–10.5)
Creatinine, Ser: 0.7 mg/dL (ref 0.50–1.10)
GFR calc Af Amer: >90 mL/min (ref >90)
Glucose, Bld: 97 mg/dL (ref 70–99)
Potassium: 3.9 mEq/L (ref 3.7–5.3)
Sodium: 144 mEq/L (ref 137–147)
Total Protein: 6.9 g/dL (ref 6.0–8.3)

## 2014-05-17 LAB — URINE MICROSCOPIC-ADD ON

## 2014-05-17 LAB — LIPASE, BLOOD: Lipase: 9 U/L — ABNORMAL LOW (ref 11–59)

## 2014-05-17 MED ORDER — MORPHINE SULFATE 2 MG/ML IJ SOLN
2.0000 mg | INTRAMUSCULAR | Status: DC | PRN
  Administered 2014-05-17 – 2014-05-20 (×9): 2 mg via INTRAVENOUS
  Filled 2014-05-17 (×10): qty 1

## 2014-05-17 MED ORDER — LEVALBUTEROL TARTRATE 45 MCG/ACT IN AERO: 2.0000 | INHALATION_SPRAY | Freq: Four times a day (QID) | RESPIRATORY_TRACT | Status: DC | PRN

## 2014-05-17 MED ORDER — METOPROLOL TARTRATE 12.5 MG HALF TABLET
12.5000 mg | ORAL_TABLET | Freq: Every day | ORAL | Status: DC
  Administered 2014-05-18 – 2014-05-20 (×3): 12.5 mg via ORAL
  Filled 2014-05-17 (×3): qty 1

## 2014-05-17 MED ORDER — SODIUM CHLORIDE 0.9 % IV SOLN
INTRAVENOUS | Status: DC
Start: 2014-05-17 — End: 2014-05-20
  Administered 2014-05-17 – 2014-05-19 (×5): via INTRAVENOUS

## 2014-05-17 MED ORDER — MORPHINE SULFATE 4 MG/ML IJ SOLN: 4.0000 mg | Freq: Once | INTRAMUSCULAR | Status: DC

## 2014-05-17 MED ORDER — ACETAMINOPHEN 650 MG RE SUPP: 650.0000 mg | Freq: Four times a day (QID) | RECTAL | Status: DC | PRN

## 2014-05-17 MED ORDER — CLONAZEPAM 1 MG PO TABS
1.0000 mg | ORAL_TABLET | Freq: Three times a day (TID) | ORAL | Status: DC
  Administered 2014-05-17 – 2014-05-19 (×5): 1 mg via ORAL
  Filled 2014-05-17 (×5): qty 1

## 2014-05-17 MED ORDER — ONDANSETRON HCL 4 MG PO TABS: 4.0000 mg | ORAL_TABLET | Freq: Four times a day (QID) | ORAL | Status: DC | PRN

## 2014-05-17 MED ORDER — BUDESONIDE-FORMOTEROL FUMARATE 160-4.5 MCG/ACT IN AERO
2.0000 | INHALATION_SPRAY | Freq: Two times a day (BID) | RESPIRATORY_TRACT | Status: DC
  Administered 2014-05-18 – 2014-05-20 (×4): 2 via RESPIRATORY_TRACT
  Filled 2014-05-17: qty 6

## 2014-05-17 MED ORDER — MORPHINE SULFATE 15 MG PO TABS
60.0000 mg | ORAL_TABLET | ORAL | Status: DC | PRN
  Administered 2014-05-18: 60 mg via ORAL
  Filled 2014-05-17: qty 4

## 2014-05-17 MED ORDER — ONDANSETRON HCL 4 MG/2ML IJ SOLN
4.0000 mg | Freq: Four times a day (QID) | INTRAMUSCULAR | Status: DC | PRN
Start: 2014-05-17 — End: 2014-05-20

## 2014-05-17 MED ORDER — KETOROLAC TROMETHAMINE 30 MG/ML IJ SOLN
30.0000 mg | Freq: Four times a day (QID) | INTRAMUSCULAR | Status: DC | PRN
  Administered 2014-05-18 – 2014-05-20 (×6): 30 mg via INTRAVENOUS
  Filled 2014-05-17: qty 1
  Filled 2014-05-17 (×4): qty 2
  Filled 2014-05-17: qty 1

## 2014-05-17 MED ORDER — CEFTRIAXONE SODIUM 1 G IJ SOLR
INTRAMUSCULAR | Status: AC
  Filled 2014-05-17: qty 10

## 2014-05-17 MED ORDER — METHOCARBAMOL 500 MG PO TABS
500.0000 mg | ORAL_TABLET | Freq: Four times a day (QID) | ORAL | Status: DC
  Administered 2014-05-17 – 2014-05-20 (×11): 500 mg via ORAL
  Filled 2014-05-17 (×15): qty 1

## 2014-05-17 MED ORDER — ENOXAPARIN SODIUM 40 MG/0.4ML ~~LOC~~ SOLN
40.0000 mg | Freq: Every day | SUBCUTANEOUS | Status: DC
  Administered 2014-05-17 – 2014-05-19 (×3): 40 mg via SUBCUTANEOUS
  Filled 2014-05-17 (×4): qty 0.4

## 2014-05-17 MED ORDER — LEVALBUTEROL HCL 1.25 MG/0.5ML IN NEBU
1.2500 mg | INHALATION_SOLUTION | RESPIRATORY_TRACT | Status: DC | PRN
  Filled 2014-05-17: qty 0.5

## 2014-05-17 MED ORDER — DEXTROSE 5 % IV SOLN
1.0000 g | Freq: Once | INTRAVENOUS | Status: AC
  Administered 2014-05-17: 1 g via INTRAVENOUS

## 2014-05-17 MED ORDER — PREDNISONE 5 MG PO TABS
5.0000 mg | ORAL_TABLET | Freq: Every day | ORAL | Status: DC
  Administered 2014-05-18 – 2014-05-20 (×3): 5 mg via ORAL
  Filled 2014-05-17 (×3): qty 1

## 2014-05-17 MED ORDER — SENNA 8.6 MG PO TABS
1.0000 | ORAL_TABLET | Freq: Two times a day (BID) | ORAL | Status: DC
  Administered 2014-05-17 – 2014-05-20 (×4): 8.6 mg via ORAL
  Filled 2014-05-17 (×5): qty 1

## 2014-05-17 MED ORDER — MORPHINE SULFATE 4 MG/ML IJ SOLN
4.0000 mg | INTRAMUSCULAR | Status: AC
  Administered 2014-05-17: 4 mg via INTRAVENOUS
  Filled 2014-05-17: qty 1

## 2014-05-17 MED ORDER — PANTOPRAZOLE SODIUM 40 MG PO TBEC
40.0000 mg | DELAYED_RELEASE_TABLET | Freq: Every day | ORAL | Status: DC
Start: 2014-05-18 — End: 2014-05-20
  Administered 2014-05-18 – 2014-05-20 (×3): 40 mg via ORAL
  Filled 2014-05-17 (×3): qty 1

## 2014-05-17 MED ORDER — LEVOTHYROXINE SODIUM 75 MCG PO TABS
75.0000 ug | ORAL_TABLET | Freq: Every day | ORAL | Status: DC
  Administered 2014-05-17 – 2014-05-19 (×3): 75 ug via ORAL
  Filled 2014-05-17 (×4): qty 1

## 2014-05-17 MED ORDER — B COMPLEX-C PO TABS
1.0000 | ORAL_TABLET | Freq: Every day | ORAL | Status: DC
  Administered 2014-05-18 – 2014-05-20 (×3): 1 via ORAL
  Filled 2014-05-17 (×3): qty 1

## 2014-05-17 MED ORDER — SODIUM CHLORIDE 0.9 % IV BOLUS (SEPSIS)
1000.0000 mL | INTRAVENOUS | Status: AC
  Administered 2014-05-17: 1000 mL via INTRAVENOUS

## 2014-05-17 MED ORDER — HYDROXYZINE HCL 50 MG PO TABS
50.0000 mg | ORAL_TABLET | Freq: Four times a day (QID) | ORAL | Status: DC | PRN
  Filled 2014-05-17: qty 2

## 2014-05-17 MED ORDER — ONDANSETRON HCL 4 MG/2ML IJ SOLN
4.0000 mg | Freq: Once | INTRAMUSCULAR | Status: AC
  Administered 2014-05-17: 4 mg via INTRAVENOUS
  Filled 2014-05-17: qty 2

## 2014-05-17 MED ORDER — ACETAMINOPHEN 325 MG PO TABS
650.0000 mg | ORAL_TABLET | Freq: Four times a day (QID) | ORAL | Status: DC | PRN
  Filled 2014-05-17: qty 2

## 2014-05-17 NOTE — ED Notes (Signed)
Patient has decreased ability to assist with self care.  Requests assistance with undressing.

## 2014-05-17 NOTE — H&P (Signed)
[facility] History and Physical  Daisy [name] NFA:[id number] DOB: Aug 08, 1951 DOA: 05/17/2014  Referring physician: Dr Aline Brochure PCP:  Melinda Crutch, MD   Chief Complaint:  Abdominal and bilateral flank pain x 39 days  63 year old female with history of asthma, hypertension, obstructive sleep apnea on CPAP, history of Addison's disease on chronic low-dose prednisone, history of adenocarcinoma of the breast , hypothyroidism, fibromyalgia, chronic migraines, history of multiple sclerosis, bilateral kidney stones (follows with Dr. Risa Capuano) presented to med [facility] today with complaints of lower abdominal pain radiating to the flanks bilaterally since 2 days back. Patient reports having crampy pain over her lower abdomen that was radiating to the flanks off-and-on. Yesterday she had a temperature of 101.67F at home and was feeling nauseous. She called her endocrinologist Dr. Buddy Duty who is back and she was not sure if this was worsening of her adrenal crisis. Patient denies any vomiting, but does report dysuria. Patient denies headache, dizziness, chest pain, palpitations, SOB,  bowel or symptoms. Denies change in weight or appetite.  Course in the ED Patient's vitals were stable . Blood work done was unremarkable. UA was suggestive of UTI. CT scan of the abdomen and pelvis done in the ED showed unchanged size of a right-kidney 67mm stone . Also had numerous small left-sided kidneys without any hydronephrosis or abscess.  Condition given a dose of IV Rocephin and IV morphine without much improvement in her symptoms and  hospitalists or for admission to a medical floor under observation.  Review of Systems:  Constitutional: ever, chills, denies diaphoresis, appetite change and fatigue.  HEENT: Denies photophobia, hearing loss, ear pain, congestion, sore throat, rhinorrhea, sneezing,, trouble swallowing, neck pain,  Respiratory: Denies SOB, DOE, cough, chest tightness,  and wheezing.    Cardiovascular: Denies chest pain, palpitations and leg swelling.  Gastrointestinal: Nausea, abdominal pain, Denies  vomiting,  diarrhea, constipation, blood in stool and abdominal distention.  Genitourinary: Denies dysuria, urgency, frequency and flank pain, denies hematuria,  and difficulty urinating.  Endocrine: Denies: hot or cold intolerance,polyuria, polydipsia. Musculoskeletal: Chronic back pain Denies myalgias,  joint swelling, arthralgias and gait problem.  Skin: Denies pallor, rash and wound.  Neurological: Denies dizziness, seizures, syncope, weakness, light-headedness, numbness and headaches.  Psychiatric/Behavioral: Denies confusion,   Past Medical History  Diagnosis Date  . Asthma   . Hypertension   . Shingles   . Sleep apnea   . OSA on CPAP   . Adenocarcinoma of breast 07/13/1992  . Hypothyroidism   . H/O esophageal spasm     "I can have as many as 36 TUMS before they stop"  . Radiation adverse effect     "100% blockage on left; 50% on right salivary; can drain w/massage"  . Diabetes mellitus     "diet controlled"  . Restless leg syndrome, controlled 02/19/12    "much better since being on CPAP; been on that ~ 1 month now"  . COPD (chronic obstructive pulmonary disease)   . Pneumonia     "I've had it several times"  . Chronic bronchitis   . Positive TB test     "ran deep in my father's family; went on INH for awhile; it caused migraines"  . Adrenal insufficiency     "w/secondary Addison's"  . Anemia   . GERD (gastroesophageal reflux disease)   . H/O hiatal hernia   . Upper GI bleeding 1970-1980    "had 4"  . 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 Migraines     "much better now" (02/19/12)  . Headache(784.0)     "when I don't eat" (02/19/12)  . Anxiety   . Arthritis   . Fibromyalgia   . Multiple sclerosis 05/1988    "dr's in [place] don't think I have it; dr's in [place] still do" (02/19/12)  . On home  oxygen therapy     2 L at night  . Bruises easily   . Stress incontinence in female   . Addison's disease    Past Surgical History  Procedure Laterality Date  . Tonsillectomy and adenoidectomy  1964  . Mastectomy  2010    left  . Appendectomy  1960's  . Cholecystectomy  1980's  . Abdominal hysterectomy  1987  . Bilateral oophorectomy  1988  . Breast lumpectomy  07/13/1992    left  . Spinal cord stimulator insertion  ~ 2004  . Back surgery  ~ 2004    "chipped disk out to put in permanent stimulator"  . Band hemorrhoidectomy  1979  . Salivary gland surgery  ~ 2002    "left was 100% blocked twice; right 50%; only had one surgery"  . Laparoscopy    . Portacath placement  10/30/2012    Procedure: INSERTION PORT-A-CATH;  Surgeon: Odis Hollingshead, MD;  Location: [facility];  Service: General;  Laterality: Right;  Right Ultrasound guided port-a-cath insertion  . Esophagogastroduodenoscopy N/A 04/17/2014    Procedure: ESOPHAGOGASTRODUODENOSCOPY (EGD);  Surgeon: Missy Sabins, MD;  Location: [facility];  Service: Endoscopy;  Laterality: N/A;  . Savory dilation N/A 04/17/2014    Procedure: SAVORY DILATION;  Surgeon: Missy Sabins, MD;  Location: [facility];  Service: Endoscopy;  Laterality: N/A;   Social History:  reports that she has never smoked. She has never used smokeless tobacco. She reports that she does not drink alcohol or use illicit drugs.  Allergies  Allergen Reactions  . Compazine Other (See Comments)    Hyper and shaky  . Iohexol      Desc: ANAPHYLACTIC REACTION!-kmr-10/16/05   . Ivp Dye [Iodinated Diagnostic Agents] Anaphylaxis and Other (See Comments)    Fever and blood clot  . Lisinopril Cough  . Metformin And Related     Migraine   . Metoclopramide Hcl Other (See Comments)    Hyper, shaky  . Promethazine Hcl Other (See Comments)    Hyper,,,,shaky  . Sulfa Antibiotics Swelling    Hallucinate   . Terbinafine Hcl Other (See Comments)    Throat closed up.  .  Vancomycin Swelling  . Guaifenesin Other (See Comments)    hyper  . Amlodipine Swelling  . Dilaudid [Hydromorphone Hcl] Nausea And Vomiting    Headaches     Family History  Problem Relation Age of Onset  . Lung cancer Paternal Uncle     smoker  . Breast cancer Cousin 66    maternal cousin  . Throat cancer Cousin     maternal cousin    Prior to Admission medications   Medication Sig Start Date End Date Taking? Authorizing Provider  omeprazole (PRILOSEC) 40 MG capsule Take 40 mg by mouth 2 (two) times daily.   Yes Historical Provider, MD  B Complex-C (B-COMPLEX WITH VITAMIN C) tablet Take 1 tablet by mouth daily.    Historical Provider, MD  budesonide-formoterol (SYMBICORT) 160-4.5 MCG/ACT inhaler Inhale 2 puffs into the lungs 2 (two) times daily.     Historical Provider, MD  CALCIUM PO Take 1 tablet  by mouth daily.    Historical Provider, MD  clonazePAM (KLONOPIN) 1 MG tablet Take 1 mg by mouth 3 (three) times daily.     Historical Provider, MD  esomeprazole (NEXIUM) 20 MG capsule Take 20 mg by mouth daily at 12 noon.    Historical Provider, MD  furosemide (LASIX) 20 MG tablet Take 20 mg by mouth daily as needed. For fluid    Historical Provider, MD  hydrOXYzine (ATARAX/VISTARIL) 50 MG tablet Take 50-100 mg by mouth 4 (four) times daily as needed for itching. itching    Historical Provider, MD  levalbuterol (XOPENEX HFA) 45 MCG/ACT inhaler Inhale 2 puffs into the lungs every 4 (four) hours as needed. Shortness of breath.    Historical Provider, MD  levalbuterol (XOPENEX) 1.25 MG/0.5ML nebulizer solution Take 1.25 mg by nebulization every 4 (four) hours as needed for wheezing or shortness of breath.    Historical Provider, MD  levothyroxine (SYNTHROID, LEVOTHROID) 75 MCG tablet Take 75 mcg by mouth at bedtime.     Historical Provider, MD  lidocaine-prilocaine (EMLA) cream Apply 1 application topically once. Apply to [facility] site 1-2 hours prior to access as directed. 04/08/13   Lennis Marion Downer, MD  magnesium chloride (SLOW-MAG) 64 MG TBEC Take 1 tablet by mouth daily as needed. With lasix    Historical Provider, MD  metoprolol tartrate (LOPRESSOR) 25 MG tablet Take 12.5 mg by mouth daily. Takes an extra 12.5mg  if having heart palpitations    Historical Provider, MD  morphine (MSIR) 30 MG tablet Take 60 mg by mouth every 4 (four) hours as needed. Pain.    Historical Provider, MD  ondansetron (ZOFRAN ODT) 4 MG disintegrating tablet Take 1 tablet (4 mg total) by mouth every 6 (six) hours as needed for nausea or vomiting. 04/20/14   Luella Cook III, MD  pantoprazole (PROTONIX) 40 MG tablet Take 40 mg by mouth daily.    Historical Provider, MD  potassium chloride SA (K-DUR,KLOR-CON) 20 MEQ tablet Take 20 mEq by mouth daily as needed. Take with lasix    Historical Provider, MD  predniSONE (DELTASONE) 5 MG tablet Take 5 mg by mouth daily.     Historical Provider, MD  senna (SENOKOT) 8.6 MG TABS Take 1 tablet (8.6 mg total) by mouth 2 (two) times daily. 02/22/12   Sorin June Leap, MD     Physical Exam:  Filed Vitals:   05/17/14 1525 05/17/14 1936 05/17/14 2136 05/17/14 2234  BP: 130/91 161/74 166/90 153/86  Pulse: 88 87 97 101  Temp: 98.4 F (36.9 C)  98.2 F (36.8 C) 98.2 F (36.8 C)  TempSrc: Oral   Oral  Resp: 18 16 20 20   Height: 5\' 2"  (1.575 m)   5\' 2"  (1.575 m)  Weight: 83.462 kg (184 lb)   86 kg (189 lb 9.5 oz)  SpO2: 92% 92% 92% 97%    Constitutional: Vital signs reviewed. Elderly female in no acute distress. HEENT: no pallor, no icterus, moist oral mucosa,  Cardiovascular: RRR, S1 normal, S2 normal, no MRG Chest: CTAB, no wheezes, rales, or rhonchi, right-sided Port-A-Cath (for  poor IV access) Abdominal: Soft.  non-distended, bowel sounds are normal, bilateral lower quadrant tenderness with bilateral CVA tenderness (right >left)  Ext: warm, no edema Neurological: A&O x3, non focal  Labs on Admission:  Basic Metabolic Panel:  Recent Labs Lab 05/17/14 1640  NA  144  K 3.9  CL 103  CO2 32  GLUCOSE 97  BUN 18  CREATININE 0.70  CALCIUM 8.9   Liver Function Tests:  Recent Labs Lab 05/17/14 1640  AST 17  ALT 13  ALKPHOS 73  BILITOT <0.2*  PROT 6.9  ALBUMIN 3.7    Recent Labs Lab 05/17/14 1640  LIPASE 9*   No results found for this basename: AMMONIA,  in the last 168 hours CBC:  Recent Labs Lab 05/17/14 1640  WBC 9.0  NEUTROABS 5.2  HGB 12.6  HCT 38.2  MCV 88.4  PLT 191   Cardiac Enzymes: No results found for this basename: CKTOTAL, CKMB, CKMBINDEX, TROPONINI,  in the last 168 hours BNP: No components found with this basename: POCBNP,  CBG: No results found for this basename: GLUCAP,  in the last 168 hours  Radiological Exams on Admission: Ct Abdomen Pelvis Wo Contrast  05/17/2014   CLINICAL DATA:  Bilateral flank pain, worse on the right. History of renal stones.  EXAM: CT ABDOMEN AND PELVIS WITHOUT CONTRAST  TECHNIQUE: Multidetector CT imaging of the abdomen and pelvis was performed following the standard protocol without IV contrast.  COMPARISON:  02/09/2014  FINDINGS: Calcified nodule in the right lower lobe is unchanged. Minimal scarring is noted in the right middle lobe and lingula. There is no pleural effusion.  The gallbladder is surgically absent. Hepatic and splenic calcifications are unchanged and likely reflect prior granulomatous infection. The common bile duct measures up to 1.4 cm in diameter, similar to the prior study. No intrahepatic biliary dilatation is identified. The adrenal glands are unremarkable. Fatty infiltration of the pancreas is unchanged.  There is an 8 mm stone in the interpolar right kidney, in a different position than on the prior study but unchanged in size. Punctate calculi in the left kidney are similar to the prior study (seen best on coronal images). There is no hydronephrosis. No stones are seen along the course of the ureters, which are nondilated.  The bowel is nondilated. The appendix is  not identified, consistent with prior appendectomy. The uterus is absent. Bladder is unremarkable. No free fluid or enlarged lymph nodes are identified. Sequelae of prior left mastectomy are noted. Spinal cord stimulator is present. Focal subcutaneous density in the left gluteal region is unchanged. There is S-shaped thoracolumbar scoliosis. Partial L3-4 vertebral body fusion and advanced disc degeneration at L4-5 are again seen.  IMPRESSION: Right larger than left renal calculi.  No hydronephrosis.   Electronically Signed   By: Logan Bores   On: 05/17/2014 17:23    EKG: None  Assessment/Plan Principal Problem:   Acute pyelonephritis Admit under observation to medical floor UA positive for UTI. Patient given a dose of IV Rocephin in the ED which I would continue. Follow urine culture. IV hydration with normal saline. Tylenol when necessary for fever. Pain control with IV Toradol when necessary and IV morphine when necessary. Continue home dose scheduled morphine that she takes for migraine headaches. -Zofran when necessary for nausea.  Active Problems:  Chronic renal stones Follows with Dr. Risa Yeagley. Size of the right kidney stone has not increased compared to her previous CT scan. Has multiple small left-sided kidney stones. No signs of hydronephrosis.    Addison's disease Vitals stable. Follows with Dr. Buddy Duty. Continue low-dose prednisone    Hypertension Continue home dose metoprolol.  Hypothyroidism Continue Synthroid  History of multiple sclerosis Patient takes scheduled Robaxin 500 mg 4 times a day  GERD Continue PPI  OSA on CPAP     Diet: Low-sodium  DVT prophylaxis: sq lovenox   Code Status:full code Family  Communication: discussed with husband at bedside Disposition Plan: Home once improved  Midge Momon, [facility] Pager [phone]  Total time spent on admission 50 minutes  If 7PM-7AM, please contact night-coverage [email or web address] Password  [facility] 05/17/2014, 10:59 PM

## 2014-05-17 NOTE — ED Notes (Signed)
Patient was given po fluids on Dr. Ruthe Mannan request.  Patient states she doesn't want to drink them now, just wants to rest a little bit longer.

## 2014-05-17 NOTE — ED Provider Notes (Signed)
CSN: [id number]     Arrival date & time 05/17/14  1516 History  This chart was scribed for Blanchard Kelch, MD by Roxan Diesel, ED scribe.  This patient was seen in room MH09/MH09 and the patient's care was started at 4:03 PM.   Chief Complaint  Patient presents with  . Flank and Abdominal pain      Patient is a 63 y.o. female presenting with back pain. The history is provided by the patient. No language interpreter was used.  Back Pain Location:  Lumbar spine Quality:  Aching and burning Pain severity:  Severe Duration:  6 days Timing:  Constant Progression:  Worsening Associated symptoms: dysuria and fever   Associated symptoms: no abdominal pain, no chest pain and no headaches    HPI Comments: KAMIE KORBER is a 63 y.o. female with a history of kidney stones, adrenal insufficiency w/ secondary Addison's, and DM who presents to the Emergency Department complaining of lower back pain beginning 6 days ago that is more severe on her right side.  She reports that the pain has gotten progressively worse. She currently rates her pain as a 10/10 in severity. She is unsure if this pain is similar to the pain she has experienced with past kidney stones. Patient also reports both incontinence and difficulty urinating over the past few weeks.  She also reports dysuria and nausea but denies vomiting.  Pt notes she was also having trouble swallowing last month and had an esophageal dilation.  She has h/o cholecystectomy and appendectomy.    Patient sees Dr. Buddy Duty at [facility] endocrinology. She last saw him 3 days ago.    Past Medical History  Diagnosis Date  . Asthma   . Hypertension   . Shingles   . Sleep apnea   . OSA on CPAP   . Adenocarcinoma of breast 07/13/1992  . Hypothyroidism   . H/O esophageal spasm     "I can have as many as 10 TUMS before they stop"  . Radiation adverse effect     "100% blockage on left; 50% on right salivary; can drain w/massage"  . Diabetes mellitus      "diet controlled"  . Restless leg syndrome, controlled 02/19/12    "much better since being on CPAP; been on that ~ 1 month now"  . COPD (chronic obstructive pulmonary disease)   . Pneumonia     "I've had it several times"  . Chronic bronchitis   . Positive TB test     "ran deep in my father's family; went on INH for awhile; it caused migraines"  . Adrenal insufficiency     "w/secondary Addison's"  . Anemia   . GERD (gastroesophageal reflux disease)   . H/O hiatal hernia   . Upper GI bleeding 1970-1980    "had 4"  . Hepatitis B infection 1971    "in high school; after drinking after football player"  . Migraines     "much better now" (02/19/12)  . Headache(784.0)     "when I don't eat" (02/19/12)  . Anxiety   . Arthritis   . Fibromyalgia   . Multiple sclerosis 05/1988    "dr's in [place] don't think I have it; dr's in [place] still do" (02/19/12)  . On home oxygen therapy     2 L at night  . Bruises easily   . Stress incontinence in female   . Addison's disease    Past Surgical History  Procedure Laterality Date  . Tonsillectomy  and adenoidectomy  1964  . Mastectomy  2010    left  . Appendectomy  1960's  . Cholecystectomy  1980's  . Abdominal hysterectomy  1987  . Bilateral oophorectomy  1988  . Breast lumpectomy  07/13/1992    left  . Spinal cord stimulator insertion  ~ 2004  . Back surgery  ~ 2004    "chipped disk out to put in permanent stimulator"  . Band hemorrhoidectomy  1979  . Salivary gland surgery  ~ 2002    "left was 100% blocked twice; right 50%; only had one surgery"  . Laparoscopy    . Portacath placement  10/30/2012    Procedure: INSERTION PORT-A-CATH;  Surgeon: Odis Hollingshead, MD;  Location: [facility] ORS;  Service: General;  Laterality: Right;  Right Ultrasound guided port-a-cath insertion  . Esophagogastroduodenoscopy N/A 04/17/2014    Procedure: ESOPHAGOGASTRODUODENOSCOPY (EGD);  Surgeon: Missy Sabins, MD;  Location: [facility];  Service: Endoscopy;   Laterality: N/A;  . Savory dilation N/A 04/17/2014    Procedure: SAVORY DILATION;  Surgeon: Missy Sabins, MD;  Location: [facility];  Service: Endoscopy;  Laterality: N/A;   Family History  Problem Relation Age of Onset  . Lung cancer Paternal Uncle     smoker  . Breast cancer Cousin 46    maternal cousin  . Throat cancer Cousin     maternal cousin   History  Substance Use Topics  . Smoking status: Never Smoker   . Smokeless tobacco: Never Used  . Alcohol Use: No   OB History   Grav Para Term Preterm Abortions TAB SAB Ect Mult Living                 Review of Systems  Constitutional: Positive for fever. Negative for chills.  HENT: Negative for congestion, rhinorrhea and sore throat.   Eyes: Negative for visual disturbance.  Respiratory: Negative for cough and shortness of breath.   Cardiovascular: Negative for chest pain and leg swelling.  Gastrointestinal: Positive for nausea. Negative for vomiting, abdominal pain and diarrhea.  Genitourinary: Positive for dysuria, enuresis and difficulty urinating.  Musculoskeletal: Positive for back pain (lower back, more sever on right side). Negative for neck pain.  Skin: Negative for rash.  Neurological: Negative for headaches.  Hematological: Does not bruise/bleed easily.  Psychiatric/Behavioral: Negative for confusion.      Allergies  Compazine; Iohexol; Ivp dye; Lisinopril; Metformin and related; Metoclopramide hcl; Promethazine hcl; Sulfa antibiotics; Terbinafine hcl; Vancomycin; Guaifenesin; Amlodipine; and Dilaudid  Home Medications   Prior to Admission medications   Medication Sig Start Date End Date Taking? Authorizing Provider  omeprazole (PRILOSEC) 40 MG capsule Take 40 mg by mouth 2 (two) times daily.   Yes Historical Provider, MD  B Complex-C (B-COMPLEX WITH VITAMIN C) tablet Take 1 tablet by mouth daily.    Historical Provider, MD  budesonide-formoterol (SYMBICORT) 160-4.5 MCG/ACT inhaler Inhale 2 puffs into the  lungs 2 (two) times daily.     Historical Provider, MD  CALCIUM PO Take 1 tablet by mouth daily.    Historical Provider, MD  clonazePAM (KLONOPIN) 1 MG tablet Take 1 mg by mouth 3 (three) times daily.     Historical Provider, MD  esomeprazole (NEXIUM) 20 MG capsule Take 20 mg by mouth daily at 12 noon.    Historical Provider, MD  furosemide (LASIX) 20 MG tablet Take 20 mg by mouth daily as needed. For fluid    Historical Provider, MD  hydrOXYzine (ATARAX/VISTARIL) 50 MG tablet Take  50-100 mg by mouth 4 (four) times daily as needed for itching. itching    Historical Provider, MD  levalbuterol (XOPENEX HFA) 45 MCG/ACT inhaler Inhale 2 puffs into the lungs every 4 (four) hours as needed. Shortness of breath.    Historical Provider, MD  levalbuterol (XOPENEX) 1.25 MG/0.5ML nebulizer solution Take 1.25 mg by nebulization every 4 (four) hours as needed for wheezing or shortness of breath.    Historical Provider, MD  levothyroxine (SYNTHROID, LEVOTHROID) 75 MCG tablet Take 75 mcg by mouth at bedtime.     Historical Provider, MD  lidocaine-prilocaine (EMLA) cream Apply 1 application topically once. Apply to [facility] site 1-2 hours prior to access as directed. 04/08/13   Lennis Marion Downer, MD  magnesium chloride (SLOW-MAG) 64 MG TBEC Take 1 tablet by mouth daily as needed. With lasix    Historical Provider, MD  metoprolol tartrate (LOPRESSOR) 25 MG tablet Take 12.5 mg by mouth daily. Takes an extra 12.5mg  if having heart palpitations    Historical Provider, MD  morphine (MSIR) 30 MG tablet Take 60 mg by mouth every 4 (four) hours as needed. Pain.    Historical Provider, MD  ondansetron (ZOFRAN ODT) 4 MG disintegrating tablet Take 1 tablet (4 mg total) by mouth every 6 (six) hours as needed for nausea or vomiting. 04/20/14   Luella Cook III, MD  pantoprazole (PROTONIX) 40 MG tablet Take 40 mg by mouth daily.    Historical Provider, MD  potassium chloride SA (K-DUR,KLOR-CON) 20 MEQ tablet Take 20 mEq by mouth daily as  needed. Take with lasix    Historical Provider, MD  predniSONE (DELTASONE) 5 MG tablet Take 5 mg by mouth daily.     Historical Provider, MD  senna (SENOKOT) 8.6 MG TABS Take 1 tablet (8.6 mg total) by mouth 2 (two) times daily. 02/22/12   Sorin June Leap, MD   BP 130/91  Pulse 88  Temp(Src) 98.4 F (36.9 C) (Oral)  Resp 18  Ht 5\' 2"  (1.575 m)  Wt 184 lb (83.462 kg)  BMI 33.65 kg/m2  SpO2 92% Physical Exam  Nursing note and vitals reviewed. Constitutional: She is oriented to person, place, and time. She appears well-developed and well-nourished. No distress.  HENT:  Head: Normocephalic and atraumatic.  Mouth/Throat: Oropharynx is clear and moist.  Eyes: Conjunctivae and EOM are normal. Pupils are equal, round, and reactive to light.  Neck: Normal range of motion. Neck supple. No tracheal deviation present.  Cardiovascular: Normal rate, regular rhythm and normal heart sounds.  Exam reveals no gallop and no friction rub.   No murmur heard. Pulmonary/Chest: Effort normal and breath sounds normal. No respiratory distress. She has no wheezes. She has no rales.  Abdominal: Soft. Bowel sounds are normal. She exhibits no distension and no mass. There is tenderness (mild diffuse nonfocal tenderness to palpation of abdomen). There is no rebound and no guarding.  Musculoskeletal: Normal range of motion.  Mild bilateral CVA tenderness.   Neurological: She is alert and oriented to person, place, and time.  Skin: Skin is warm and dry.  Psychiatric: She has a normal mood and affect. Her behavior is normal.    ED Course  Procedures (including critical care time) DIAGNOSTIC STUDIES: Oxygen Saturation is 92% on room air,  by my interpretation.    COORDINATION OF CARE: 4:11 PM-Discussed treatment plan which includes morphine, IV fluids, Zofran, abdominal CT, and labs with pt at bedside and pt agreed to plan.     Labs Review Labs  Reviewed  COMPREHENSIVE METABOLIC PANEL - Abnormal; Notable for the  following:    Total Bilirubin <0.2 (*)    All other components within normal limits  LIPASE, BLOOD - Abnormal; Notable for the following:    Lipase 9 (*)    All other components within normal limits  URINALYSIS, ROUTINE W REFLEX MICROSCOPIC - Abnormal; Notable for the following:    Color, Urine ORANGE (*)    APPearance CLOUDY (*)    Specific Gravity, Urine 1.033 (*)    Hgb urine dipstick MODERATE (*)    Bilirubin Urine SMALL (*)    Ketones, ur 15 (*)    Protein, ur 30 (*)    Nitrite POSITIVE (*)    Leukocytes, UA SMALL (*)    All other components within normal limits  URINE MICROSCOPIC-ADD ON - Abnormal; Notable for the following:    Bacteria, UA MANY (*)    All other components within normal limits  URINE CULTURE  CBC WITH DIFFERENTIAL    Imaging Review Ct Abdomen Pelvis Wo Contrast  05/17/2014   CLINICAL DATA:  Bilateral flank pain, worse on the right. History of renal stones.  EXAM: CT ABDOMEN AND PELVIS WITHOUT CONTRAST  TECHNIQUE: Multidetector CT imaging of the abdomen and pelvis was performed following the standard protocol without IV contrast.  COMPARISON:  02/09/2014  FINDINGS: Calcified nodule in the right lower lobe is unchanged. Minimal scarring is noted in the right middle lobe and lingula. There is no pleural effusion.  The gallbladder is surgically absent. Hepatic and splenic calcifications are unchanged and likely reflect prior granulomatous infection. The common bile duct measures up to 1.4 cm in diameter, similar to the prior study. No intrahepatic biliary dilatation is identified. The adrenal glands are unremarkable. Fatty infiltration of the pancreas is unchanged.  There is an 8 mm stone in the interpolar right kidney, in a different position than on the prior study but unchanged in size. Punctate calculi in the left kidney are similar to the prior study (seen best on coronal images). There is no hydronephrosis. No stones are seen along the course of the ureters, which  are nondilated.  The bowel is nondilated. The appendix is not identified, consistent with prior appendectomy. The uterus is absent. Bladder is unremarkable. No free fluid or enlarged lymph nodes are identified. Sequelae of prior left mastectomy are noted. Spinal cord stimulator is present. Focal subcutaneous density in the left gluteal region is unchanged. There is S-shaped thoracolumbar scoliosis. Partial L3-4 vertebral body fusion and advanced disc degeneration at L4-5 are again seen.  IMPRESSION: Right larger than left renal calculi.  No hydronephrosis.   Electronically Signed   By: Logan Bores   On: 05/17/2014 17:23     EKG Interpretation None      MDM   Final diagnoses:  Pyelonephritis    9:11 PM 63 y.o. female here with bilateral flank pain worsening over the last week. She notes fever of 101 yesterday but vital signs are unremarkable here. She has bilateral CVA tenderness. Suspicious for pyelonephritis. She does have a history of kidney stones so will get a CT scan of her abdomen. Will get pain control with morphine and Zofran for nausea.  Pt unable to get out of bed to use the bathroom d/t pain. Will admit to hospitalist for pain control.   The patient will be admitted to [facility]. Any medications given in the ED during this visit are listed below:  Medications  cefTRIAXone (ROCEPHIN) 1 G injection (not administered)  morphine 4 MG/ML injection 4 mg (not administered)  morphine 4 MG/ML injection 4 mg (4 mg Intravenous Given 05/17/14 1648)  sodium chloride 0.9 % bolus 1,000 mL (0 mLs Intravenous Stopped 05/17/14 1746)  ondansetron (ZOFRAN) injection 4 mg (4 mg Intravenous Given 05/17/14 1648)  cefTRIAXone (ROCEPHIN) 1 g in dextrose 5 % 50 mL IVPB (0 g Intravenous Stopped 05/17/14 1929)  morphine 4 MG/ML injection 4 mg (4 mg Intravenous Given 05/17/14 2030)       I personally performed the services described in this documentation, which was scribed in my presence. The recorded information  has been reviewed and is accurate.    Blanchard Kelch, MD 05/17/14 2329

## 2014-05-17 NOTE — ED Notes (Signed)
Patient still attempting to use the bedpan.

## 2014-05-17 NOTE — ED Notes (Signed)
Severe Bil flank and abd pain since Friday night. Has known Kidney stone 1mm on right and 81mm smaller ones on left.

## 2014-05-17 NOTE — ED Notes (Signed)
R side chest power port accessed, sterile technique followed, port flushed, good blood return, flows to gravity, power port huber needle used

## 2014-05-17 NOTE — ED Notes (Signed)
[facility] here and has assumed care of the patient.  Report given to [facility]. Burt Knack, Therapist, sports.

## 2014-05-17 NOTE — ED Notes (Signed)
Third dose of Morphine not given as this was an extra dose ordered by Dr. Aline Brochure that was not needed by patient.

## 2014-05-17 NOTE — ED Notes (Signed)
Patient currently in radiology.

## 2014-05-17 NOTE — ED Notes (Signed)
Has returned from radiology.

## 2014-05-17 NOTE — ED Notes (Signed)
Report has been given to [facility], [facility].  No questions voiced after report given.

## 2014-05-18 DIAGNOSIS — Z803 Family history of malignant neoplasm of breast: Secondary | ICD-10-CM | POA: Diagnosis not present

## 2014-05-18 DIAGNOSIS — E2749 Other adrenocortical insufficiency: Secondary | ICD-10-CM | POA: Diagnosis present

## 2014-05-18 DIAGNOSIS — G2581 Restless legs syndrome: Secondary | ICD-10-CM | POA: Diagnosis present

## 2014-05-18 DIAGNOSIS — G35 Multiple sclerosis: Secondary | ICD-10-CM | POA: Diagnosis present

## 2014-05-18 DIAGNOSIS — Z9981 Dependence on supplemental oxygen: Secondary | ICD-10-CM | POA: Diagnosis not present

## 2014-05-18 DIAGNOSIS — Z881 Allergy status to other antibiotic agents status: Secondary | ICD-10-CM | POA: Diagnosis not present

## 2014-05-18 DIAGNOSIS — N3941 Urge incontinence: Secondary | ICD-10-CM | POA: Diagnosis present

## 2014-05-18 DIAGNOSIS — I10 Essential (primary) hypertension: Secondary | ICD-10-CM | POA: Diagnosis present

## 2014-05-18 DIAGNOSIS — K219 Gastro-esophageal reflux disease without esophagitis: Secondary | ICD-10-CM | POA: Diagnosis present

## 2014-05-18 DIAGNOSIS — F411 Generalized anxiety disorder: Secondary | ICD-10-CM | POA: Diagnosis present

## 2014-05-18 DIAGNOSIS — G8929 Other chronic pain: Secondary | ICD-10-CM | POA: Diagnosis present

## 2014-05-18 DIAGNOSIS — J449 Chronic obstructive pulmonary disease, unspecified: Secondary | ICD-10-CM | POA: Diagnosis present

## 2014-05-18 DIAGNOSIS — IMO0002 Reserved for concepts with insufficient information to code with codable children: Secondary | ICD-10-CM | POA: Diagnosis not present

## 2014-05-18 DIAGNOSIS — Z9089 Acquired absence of other organs: Secondary | ICD-10-CM | POA: Diagnosis not present

## 2014-05-18 DIAGNOSIS — N200 Calculus of kidney: Secondary | ICD-10-CM | POA: Diagnosis present

## 2014-05-18 DIAGNOSIS — G4733 Obstructive sleep apnea (adult) (pediatric): Secondary | ICD-10-CM | POA: Diagnosis present

## 2014-05-18 DIAGNOSIS — Z885 Allergy status to narcotic agent status: Secondary | ICD-10-CM | POA: Diagnosis not present

## 2014-05-18 DIAGNOSIS — Z79899 Other long term (current) drug therapy: Secondary | ICD-10-CM | POA: Diagnosis not present

## 2014-05-18 DIAGNOSIS — E079 Disorder of thyroid, unspecified: Secondary | ICD-10-CM

## 2014-05-18 DIAGNOSIS — Z87442 Personal history of urinary calculi: Secondary | ICD-10-CM | POA: Diagnosis not present

## 2014-05-18 DIAGNOSIS — IMO0001 Reserved for inherently not codable concepts without codable children: Secondary | ICD-10-CM | POA: Diagnosis present

## 2014-05-18 DIAGNOSIS — E039 Hypothyroidism, unspecified: Secondary | ICD-10-CM | POA: Diagnosis present

## 2014-05-18 DIAGNOSIS — Z9010 Acquired absence of unspecified breast and nipple: Secondary | ICD-10-CM | POA: Diagnosis not present

## 2014-05-18 DIAGNOSIS — Z888 Allergy status to other drugs, medicaments and biological substances status: Secondary | ICD-10-CM | POA: Diagnosis not present

## 2014-05-18 DIAGNOSIS — R109 Unspecified abdominal pain: Secondary | ICD-10-CM | POA: Diagnosis present

## 2014-05-18 DIAGNOSIS — M129 Arthropathy, unspecified: Secondary | ICD-10-CM | POA: Diagnosis present

## 2014-05-18 DIAGNOSIS — K224 Dyskinesia of esophagus: Secondary | ICD-10-CM | POA: Diagnosis present

## 2014-05-18 DIAGNOSIS — E119 Type 2 diabetes mellitus without complications: Secondary | ICD-10-CM | POA: Diagnosis present

## 2014-05-18 DIAGNOSIS — N10 Acute tubulo-interstitial nephritis: Secondary | ICD-10-CM | POA: Diagnosis present

## 2014-05-18 DIAGNOSIS — N12 Tubulo-interstitial nephritis, not specified as acute or chronic: Secondary | ICD-10-CM

## 2014-05-18 DIAGNOSIS — Z801 Family history of malignant neoplasm of trachea, bronchus and lung: Secondary | ICD-10-CM | POA: Diagnosis not present

## 2014-05-18 DIAGNOSIS — Z853 Personal history of malignant neoplasm of breast: Secondary | ICD-10-CM | POA: Diagnosis not present

## 2014-05-18 LAB — URINE CULTURE
Colony Count: NO GROWTH
Culture: NO GROWTH

## 2014-05-18 MED ORDER — DEXTROSE 5 % IV SOLN
1.0000 g | INTRAVENOUS | Status: DC
  Administered 2014-05-18 – 2014-05-19 (×2): 1 g via INTRAVENOUS
  Filled 2014-05-18 (×3): qty 10

## 2014-05-18 MED ORDER — MORPHINE SULFATE 15 MG PO TABS
30.0000 mg | ORAL_TABLET | ORAL | Status: DC | PRN
  Administered 2014-05-19 – 2014-05-20 (×2): 30 mg via ORAL
  Filled 2014-05-18 (×2): qty 2

## 2014-05-18 MED ORDER — PHENAZOPYRIDINE HCL 100 MG PO TABS
100.0000 mg | ORAL_TABLET | Freq: Three times a day (TID) | ORAL | Status: AC
  Administered 2014-05-18 – 2014-05-19 (×2): 100 mg via ORAL
  Filled 2014-05-18 (×3): qty 1

## 2014-05-18 NOTE — Progress Notes (Signed)
[facility] PROGRESS NOTE  Daisy [name] MPN:[id number] DOB: March 09, 1951 DOA: 05/17/2014 PCP:  Melinda Crutch, MD  Assessment/Plan: 1-Pyelonephritis:  Patient presents with flank pain, UA with positive nitrates, dysuria. Continue with ceftriaxone. Follow urine culture.  IV hydration with normal saline. Tylenol when necessary for fever. Pain control with IV Toradol when necessary and IV morphine when necessary. Continue home dose scheduled morphine that she takes for migraine headaches.  -Zofran when necessary for nausea.   Chronic renal stones  Follows with Dr. Risa Gang. Size of the right kidney stone has not increased compared to her previous CT scan. Has multiple small left-sided kidney stones. No signs of hydronephrosis.  Discussed with Dr Roni Bread, continue with treatment of pyelonephritis. Needs to follow up with Dr Risa Steen.   Addison's disease  Vitals stable. Follows with Dr. Buddy Duty. Continue low-dose prednisone   Hypertension  Continue home dose metoprolol.   Hypothyroidism  Continue Synthroid   History of multiple sclerosis  Patient takes scheduled Robaxin 500 mg 4 times a day   GERD  Continue PPI  OSA on CPAP      Code Status: full code.  Family Communication: Care discussed with patient.  Disposition Plan: Remain inpatient.    Consultants:  none  Procedures:  none  Antibiotics:  Ceftriaxone.   HPI/Subjective: Still with severe flank pain, dysuria.   Objective: Filed Vitals:   05/18/14 0506  BP: 161/80  Pulse: 93  Temp: 98.9 F (37.2 C)  Resp: 20    Intake/Output Summary (Last 24 hours) at 05/18/14 0930 Last data filed at 05/18/14 0500  Gross per 24 hour  Intake 621.67 ml  Output    300 ml  Net 321.67 ml   Filed Weights   05/17/14 1525 05/17/14 2234  Weight: 83.462 kg (184 lb) 86 kg (189 lb 9.5 oz)    Exam:   General:  No distress.   Cardiovascular: S 1, S 2 RRR  Respiratory:CTA  Abdomen: BS present, soft, nt, BL CVA tenderness.    Musculoskeletal: no edema.   Data Reviewed: Basic Metabolic Panel:  Recent Labs Lab 05/17/14 1640  NA 144  K 3.9  CL 103  CO2 32  GLUCOSE 97  BUN 18  CREATININE 0.70  CALCIUM 8.9   Liver Function Tests:  Recent Labs Lab 05/17/14 1640  AST 17  ALT 13  ALKPHOS 73  BILITOT <0.2*  PROT 6.9  ALBUMIN 3.7    Recent Labs Lab 05/17/14 1640  LIPASE 9*   No results found for this basename: AMMONIA,  in the last 168 hours CBC:  Recent Labs Lab 05/17/14 1640  WBC 9.0  NEUTROABS 5.2  HGB 12.6  HCT 38.2  MCV 88.4  PLT 191   Cardiac Enzymes: No results found for this basename: CKTOTAL, CKMB, CKMBINDEX, TROPONINI,  in the last 168 hours BNP (last 3 results) No results found for this basename: PROBNP,  in the last 8760 hours CBG: No results found for this basename: GLUCAP,  in the last 168 hours  No results found for this or any previous visit (from the past 240 hour(s)).   Studies: Ct Abdomen Pelvis Wo Contrast  05/17/2014   CLINICAL DATA:  Bilateral flank pain, worse on the right. History of renal stones.  EXAM: CT ABDOMEN AND PELVIS WITHOUT CONTRAST  TECHNIQUE: Multidetector CT imaging of the abdomen and pelvis was performed following the standard protocol without IV contrast.  COMPARISON:  02/09/2014  FINDINGS: Calcified nodule in the right lower lobe is unchanged. Minimal scarring  is noted in the right middle lobe and lingula. There is no pleural effusion.  The gallbladder is surgically absent. Hepatic and splenic calcifications are unchanged and likely reflect prior granulomatous infection. The common bile duct measures up to 1.4 cm in diameter, similar to the prior study. No intrahepatic biliary dilatation is identified. The adrenal glands are unremarkable. Fatty infiltration of the pancreas is unchanged.  There is an 8 mm stone in the interpolar right kidney, in a different position than on the prior study but unchanged in size. Punctate calculi in the left kidney  are similar to the prior study (seen best on coronal images). There is no hydronephrosis. No stones are seen along the course of the ureters, which are nondilated.  The bowel is nondilated. The appendix is not identified, consistent with prior appendectomy. The uterus is absent. Bladder is unremarkable. No free fluid or enlarged lymph nodes are identified. Sequelae of prior left mastectomy are noted. Spinal cord stimulator is present. Focal subcutaneous density in the left gluteal region is unchanged. There is S-shaped thoracolumbar scoliosis. Partial L3-4 vertebral body fusion and advanced disc degeneration at L4-5 are again seen.  IMPRESSION: Right larger than left renal calculi.  No hydronephrosis.   Electronically Signed   By: Logan Bores   On: 05/17/2014 17:23    Scheduled Meds: . B-complex with vitamin C  1 tablet Oral Daily  . budesonide-formoterol  2 puff Inhalation BID  . cefTRIAXone (ROCEPHIN)  IV  1 g Intravenous Q24H  . clonazePAM  1 mg Oral TID  . enoxaparin (LOVENOX) injection  40 mg Subcutaneous QHS  . levothyroxine  75 mcg Oral QHS  . methocarbamol  500 mg Oral 4 times per day  . metoprolol tartrate  12.5 mg Oral Daily  .  morphine injection  4 mg Intravenous Once  . pantoprazole  40 mg Oral Daily  . phenazopyridine  100 mg Oral TID WC  . predniSONE  5 mg Oral Daily  . senna  1 tablet Oral BID   Continuous Infusions: . sodium chloride 100 mL/hr at 05/17/14 2247    Principal Problem:   Acute pyelonephritis Active Problems:   Addison's disease   Hypertension   Nausea & vomiting   Abdominal pain, left lower quadrant    Time spent: 35 minutes.     Niel Hummer A  [facility] Pager [phone]. If 7PM-7AM, please contact night-coverage at [email or web address], password [facility] 05/18/2014, 9:30 AM  LOS: 1 day

## 2014-05-18 NOTE — Progress Notes (Signed)
UR Completed.  Daisy [name] [phone] 05/18/2014  

## 2014-05-18 NOTE — Progress Notes (Signed)
Pt refused cpap tonight.  Pt was advised that RT is available all night should she change her mind. 

## 2014-05-19 ENCOUNTER — Inpatient Hospital Stay (HOSPITAL_COMMUNITY): Payer: No Typology Code available for payment source

## 2014-05-19 LAB — BASIC METABOLIC PANEL
BUN: 13 mg/dL (ref 6–23)
CO2: 29 mEq/L (ref 19–32)
Calcium: 8.3 mg/dL — ABNORMAL LOW (ref 8.4–10.5)
Chloride: 104 mEq/L (ref 96–112)
Creatinine, Ser: 0.8 mg/dL (ref 0.50–1.10)
GFR, EST NON AFRICAN AMERICAN: 78 mL/min — AB (ref >90)
Glucose, Bld: 114 mg/dL — ABNORMAL HIGH (ref 70–99)
POTASSIUM: 4 meq/L (ref 3.7–5.3)
SODIUM: 141 meq/L (ref 137–147)

## 2014-05-19 LAB — CBC
HCT: 34.1 % — ABNORMAL LOW (ref 36.0–46.0)
Hemoglobin: 11.1 g/dL — ABNORMAL LOW (ref 12.0–15.0)
MCH: 28.8 pg (ref 26.0–34.0)
MCHC: 32.6 g/dL (ref 30.0–36.0)
MCV: 88.6 fL (ref 78.0–100.0)
PLATELETS: 164 10*3/uL (ref 150–400)
RBC: 3.85 MIL/uL — ABNORMAL LOW (ref 3.87–5.11)
RDW: 14 % (ref 11.5–15.5)
WBC: 6.1 10*3/uL (ref 4.0–10.5)

## 2014-05-19 MED ORDER — CLONAZEPAM 1 MG PO TABS
1.0000 mg | ORAL_TABLET | Freq: Three times a day (TID) | ORAL | Status: DC | PRN
  Administered 2014-05-19 (×2): 1 mg via ORAL
  Filled 2014-05-19 (×2): qty 1

## 2014-05-19 MED ORDER — HYDRALAZINE HCL 20 MG/ML IJ SOLN
10.0000 mg | Freq: Three times a day (TID) | INTRAMUSCULAR | Status: DC | PRN
  Administered 2014-05-19: 10 mg via INTRAVENOUS
  Filled 2014-05-19 (×2): qty 0.5

## 2014-05-19 NOTE — Consult Note (Signed)
Urology Consult   Physician requesting consult: Regalado  Reason for consult: Flank pain in pt with known renal stones  History of Present Illness: Daisy [name] is a 63 y.o. female with PMH significant for HTN, OSA, breast cancer, Addison's disease, DM, anxiety, fibromyalgia, multiple sclerosis, and renal stones who presented to [facility] two days ago with c/o flank pain, abdominal pain, and fever.  She states she has chronic back pain/scoliosis and a spinal stimulator.  This pain is located mid back and does not radiate.  Her back pain progressed in severity last Thurs and was noted more in the lower back as well as bilateral flanks (R > L).  She states on Friday she began to have diffuse abdominal pain with "cramping".  She had a fever at home (101) and had two episodes of vomiting on Sat.  By Sunday she said her pain was so bad she could not move and her husband took her to an urgent care.  She states her UA was positive for red blood cells and she was then told to go to [place] where she was evaled again and then admitted to [facility].  She denies any hx of gross hematuria and chills. She did have some dysuria. She states she has had no more vomiting but has remained nauseous since Sat. She does not feel her pain has improved since admission.   After admission a CT scan without contrast was performed and revealed a stable 19mm right renal stone, several left renal punctate stones, and no hydro.  These findings are unchanged from her previous study performed 02/2014.  Her UA was nitrite positive with bacteria and RBCs present.  Her urine culture has since returned with no growth.  Her WBC count and Cr have been WNL.  She has been AF since admission.  She was started on IV Rocephin, Toradol, and morphine at admission.   She does have some voiding issues.  She states that on a regular basis she will have the urge to void and then cannot once she reaches the bathroom.  She will sit on the toilet "for a  long time" before she is able to get her stream to start and then oftentimes her stream will be intermittent/hesistent.  She has "constant" urine leakage and uses 2-3 pads per day.  She denies stress incontinence but has occasional urge incontinence.  She does not feel as though she empties her bladder with each void but does not necessarily note increased urinary frequency.  She states that her abdominal "cramping" improved over the weekend when she took Azo.  She does not believe that she has ever told Dr. Risa Guice about these issues.  She has never had urodynamics.    Hx from her last OV with Enedina Finner, NP 09/2013:  [name] a previous history of nephrolithiasis and had lithotripsy one time in the distant past.  She did not tolerate this well.  She has a known right renal stone about 7-8 mm that is just being observed for now.   I saw [name] a month ago with blood on the toilet paper when she wiped associated with dysuria and low back pain.  Imaging was not conclusive of a urologic cause.  At the time she was on amoxicillin (although today she says it was augmentin) for a lung infection.  Her urine culture was clear.    Interval history: [name] presents today for a 1 month follow up.  She has had ongoing  left flank discomfort since she was last seen. This radiates to the abdomen. Nothing makes this better or worse.  She had blood on the toilet paper when she wiped until 3 days ago.  She has not seen any further blood.  She also feels like she has been passing "grit".  She has not seen an obvious "stone" pass.  She has also had associated nausea.  She denies vomiting or fever.  CT scan revealed non obstructing right (10mm) and left (punctate) renal stones.       Past Medical History  Diagnosis Date  . Asthma   . Hypertension   . Shingles   . Sleep apnea   . OSA on CPAP   . Adenocarcinoma of breast 07/13/1992  . Hypothyroidism   . H/O esophageal spasm     "I can have as many as 24 TUMS  before they stop"  . Radiation adverse effect     "100% blockage on left; 50% on right salivary; can drain w/massage"  . Diabetes mellitus     "diet controlled"  . Restless leg syndrome, controlled 02/19/12    "much better since being on CPAP; been on that ~ 1 month now"  . COPD (chronic obstructive pulmonary disease)   . Pneumonia     "I've had it several times"  . Chronic bronchitis   . Positive TB test     "ran deep in my father's family; went on INH for awhile; it caused migraines"  . Adrenal insufficiency     "w/secondary Addison's"  . Anemia   . GERD (gastroesophageal reflux disease)   . H/O hiatal hernia   . Upper GI bleeding 1970-1980    "had 4"  . Hepatitis B infection 1971    "in high school; after drinking after football player"  . Migraines     "much better now" (02/19/12)  . Headache(784.0)     "when I don't eat" (02/19/12)  . Anxiety   . Arthritis   . Fibromyalgia   . Multiple sclerosis 05/1988    "dr's in [place] don't think I have it; dr's in [place] still do" (02/19/12)  . On home oxygen therapy     2 L at night  . Bruises easily   . Stress incontinence in female   . Addison's disease     Past Surgical History  Procedure Laterality Date  . Tonsillectomy and adenoidectomy  1964  . Mastectomy  2010    left  . Appendectomy  1960's  . Cholecystectomy  1980's  . Abdominal hysterectomy  1987  . Bilateral oophorectomy  1988  . Breast lumpectomy  07/13/1992    left  . Spinal cord stimulator insertion  ~ 2004  . Back surgery  ~ 2004    "chipped disk out to put in permanent stimulator"  . Band hemorrhoidectomy  1979  . Salivary gland surgery  ~ 2002    "left was 100% blocked twice; right 50%; only had one surgery"  . Laparoscopy    . Portacath placement  10/30/2012    Procedure: INSERTION PORT-A-CATH;  Surgeon: Odis Hollingshead, MD;  Location: [facility];  Service: General;  Laterality: Right;  Right Ultrasound guided port-a-cath insertion  .  Esophagogastroduodenoscopy N/A 04/17/2014    Procedure: ESOPHAGOGASTRODUODENOSCOPY (EGD);  Surgeon: Missy Sabins, MD;  Location: [facility];  Service: Endoscopy;  Laterality: N/A;  . Savory dilation N/A 04/17/2014    Procedure: SAVORY DILATION;  Surgeon: Missy Sabins, MD;  Location: [facility];  Service: Endoscopy;  Laterality: N/A;     Current Hospital Medications:  Home meds:    Medication List    ASK your doctor about these medications       B-complex with vitamin C tablet  Take 1 tablet by mouth daily.     budesonide-formoterol 160-4.5 MCG/ACT inhaler  Commonly known as:  SYMBICORT  Inhale 2 puffs into the lungs 2 (two) times daily as needed (for asthma).     Calcium Carbonate-Vitamin D 500-125 MG-UNIT Tabs  Take 1 tablet by mouth 2 (two) times daily.     clonazePAM 1 MG tablet  Commonly known as:  KLONOPIN  Take 1 mg by mouth 3 (three) times daily.     furosemide 20 MG tablet  Commonly known as:  LASIX  Take 20 mg by mouth daily as needed (for fluid retention). For fluid     hydrOXYzine 50 MG tablet  Commonly known as:  ATARAX/VISTARIL  Take 50-100 mg by mouth 4 (four) times daily as needed for itching. itching     levalbuterol 1.25 MG/0.5ML nebulizer solution  Commonly known as:  XOPENEX  Take 1.25 mg by nebulization every 4 (four) hours as needed for wheezing or shortness of breath.     levalbuterol 45 MCG/ACT inhaler  Commonly known as:  XOPENEX HFA  Inhale 2 puffs into the lungs every 4 (four) hours as needed. Shortness of breath.     levothyroxine 75 MCG tablet  Commonly known as:  SYNTHROID, LEVOTHROID  Take 75 mcg by mouth at bedtime.     magnesium chloride 64 MG Tbec SR tablet  Commonly known as:  SLOW-MAG  Take 1 tablet by mouth daily as needed (for fluid retention). With lasix     methocarbamol 500 MG tablet  Commonly known as:  ROBAXIN  Take 500 mg by mouth 4 (four) times daily. For spasms     metoprolol tartrate 25 MG tablet  Commonly known  as:  LOPRESSOR  Take 12.5 mg by mouth as directed. 12.5 in the morning and if blood pressure is high in the afternoon she will take another     morphine 30 MG tablet  Commonly known as:  MSIR  Take 30 mg by mouth every 6 (six) hours as needed (for pain).     omeprazole 40 MG capsule  Commonly known as:  PRILOSEC  Take 40 mg by mouth 2 (two) times daily.     ondansetron 4 MG disintegrating tablet  Commonly known as:  ZOFRAN ODT  Take 1 tablet (4 mg total) by mouth every 6 (six) hours as needed for nausea or vomiting.     potassium chloride SA 20 MEQ tablet  Commonly known as:  K-DUR,KLOR-CON  Take 20 mEq by mouth daily as needed (fluid retention). Take with lasix     predniSONE 5 MG tablet  Commonly known as:  DELTASONE  Take 5 mg by mouth daily.     senna 8.6 MG Tabs tablet  Commonly known as:  SENOKOT  Take 1 tablet (8.6 mg total) by mouth 2 (two) times daily.        Scheduled Meds: . B-complex with vitamin C  1 tablet Oral Daily  . budesonide-formoterol  2 puff Inhalation BID  . cefTRIAXone (ROCEPHIN)  IV  1 g Intravenous Q24H  . enoxaparin (LOVENOX) injection  40 mg Subcutaneous QHS  . levothyroxine  75 mcg Oral QHS  . methocarbamol  500 mg Oral 4 times per day  . metoprolol tartrate  12.5 mg Oral Daily  .  morphine injection  4 mg Intravenous Once  . pantoprazole  40 mg Oral Daily  . predniSONE  5 mg Oral Daily  . senna  1 tablet Oral BID   Continuous Infusions: . sodium chloride 100 mL/hr at 05/19/14 0620   PRN Meds:.acetaminophen, acetaminophen, clonazePAM, hydrOXYzine, ketorolac, levalbuterol, morphine, morphine injection, ondansetron (ZOFRAN) IV, ondansetron  Allergies:  Allergies  Allergen Reactions  . Compazine Other (See Comments)    Hyper and shaky  . Iohexol      Desc: ANAPHYLACTIC REACTION!-kmr-10/16/05   . Ivp Dye [Iodinated Diagnostic Agents] Anaphylaxis and Other (See Comments)    Fever and blood clot  . Lisinopril Cough  . Metformin And  Related     Migraine   . Metoclopramide Hcl Other (See Comments)    Hyper, shaky  . Promethazine Hcl Other (See Comments)    Hyper,,,,shaky  . Sulfa Antibiotics Swelling    Hallucinate   . Terbinafine Hcl Other (See Comments)    Throat closed up.  . Vancomycin Swelling  . Guaifenesin Other (See Comments)    hyper  . Amlodipine Swelling  . Dilaudid [Hydromorphone Hcl] Nausea And Vomiting    Headaches     Family History  Problem Relation Age of Onset  . Lung cancer Paternal Uncle     smoker  . Breast cancer Cousin 28    maternal cousin  . Throat cancer Cousin     maternal cousin    Social History:  reports that she has never smoked. She has never used smokeless tobacco. She reports that she does not drink alcohol or use illicit drugs.  ROS: A complete review of systems was performed.  All systems are negative except for pertinent findings as noted.  Physical Exam:  Vital signs in last 24 hours: Temp:  [98 F (36.7 C)-98.2 F (36.8 C)] 98 F (36.7 C) (06/16 0536) Pulse Rate:  [62-71] 62 (06/16 0536) Resp:  [12-20] 20 (06/16 0536) BP: (149-161)/(68-79) 161/79 mmHg (06/16 0536) SpO2:  [92 %-98 %] 92 % (06/16 0938) Constitutional:  Alert and oriented, No acute distress Cardiovascular: Regular rate and rhythm Respiratory: Normal respiratory effort GI: Abdomen is soft, nondistended, no abdominal masses; mild suprapubic tenderness GU: No CVA tenderness Back: she has point tenderness over the spine in the mid back; she has pain with palpation over the right low back just superior to her spinal stimulator that progresses anteriorly in a dermatomal pattern across the flank to right lower quadrant. No rashes skin break down.  Mild tenderness to palp over left low back and flank.   Lymphatic: No lymphadenopathy Neurologic: Grossly intact, no focal deficits Psychiatric: Normal mood and affect  Laboratory Data:   Recent Labs  05/17/14 1640 05/19/14 0430  WBC 9.0 6.1  HGB  12.6 11.1*  HCT 38.2 34.1*  PLT 191 164     Recent Labs  05/17/14 1640 05/19/14 0430  NA 144 141  K 3.9 4.0  CL 103 104  GLUCOSE 97 114*  BUN 18 13  CALCIUM 8.9 8.3*  CREATININE 0.70 0.80     Results for orders placed during the hospital encounter of 05/17/14 (from the past 24 hour(s))  CBC     Status: Abnormal   Collection Time    05/19/14  4:30 AM      Result Value Ref Range   WBC 6.1  4.0 - 10.5 K/uL   RBC 3.85 (*) 3.87 - 5.11 MIL/uL   Hemoglobin 11.1 (*)  12.0 - 15.0 g/dL   HCT 34.1 (*) 36.0 - 46.0 %   MCV 88.6  78.0 - 100.0 fL   MCH 28.8  26.0 - 34.0 pg   MCHC 32.6  30.0 - 36.0 g/dL   RDW 14.0  11.5 - 15.5 %   Platelets 164  150 - 400 K/uL  BASIC METABOLIC PANEL     Status: Abnormal   Collection Time    05/19/14  4:30 AM      Result Value Ref Range   Sodium 141  137 - 147 mEq/L   Potassium 4.0  3.7 - 5.3 mEq/L   Chloride 104  96 - 112 mEq/L   CO2 29  19 - 32 mEq/L   Glucose, Bld 114 (*) 70 - 99 mg/dL   BUN 13  6 - 23 mg/dL   Creatinine, Ser 0.80  0.50 - 1.10 mg/dL   Calcium 8.3 (*) 8.4 - 10.5 mg/dL   GFR calc non Af Amer 78 (*) >90 mL/min   GFR calc Af Amer >90  >90 mL/min   Recent Results (from the past 240 hour(s))  URINE CULTURE     Status: None   Collection Time    05/17/14  5:53 PM      Result Value Ref Range Status   Specimen Description URINE, CATHETERIZED   Final   Special Requests NONE   Final   Culture  Setup Time     Final   Value: 05/17/2014 21:38     Performed at [facility] [facility]     Final   Value: NO GROWTH     Performed at [facility]   Culture     Final   Value: NO GROWTH     Performed at [facility]   Report Status 05/18/2014 FINAL   Final    Renal Function:  Recent Labs  05/17/14 1640 05/19/14 0430  CREATININE 0.70 0.80   Estimated Creatinine Clearance: 75.2 ml/min (by C-G formula based on Cr of 0.8).  Radiologic Imaging: Ct Abdomen Pelvis Wo Contrast  05/17/2014   CLINICAL DATA:   Bilateral flank pain, worse on the right. History of renal stones.  EXAM: CT ABDOMEN AND PELVIS WITHOUT CONTRAST  TECHNIQUE: Multidetector CT imaging of the abdomen and pelvis was performed following the standard protocol without IV contrast.  COMPARISON:  02/09/2014  FINDINGS: Calcified nodule in the right lower lobe is unchanged. Minimal scarring is noted in the right middle lobe and lingula. There is no pleural effusion.  The gallbladder is surgically absent. Hepatic and splenic calcifications are unchanged and likely reflect prior granulomatous infection. The common bile duct measures up to 1.4 cm in diameter, similar to the prior study. No intrahepatic biliary dilatation is identified. The adrenal glands are unremarkable. Fatty infiltration of the pancreas is unchanged.  There is an 8 mm stone in the interpolar right kidney, in a different position than on the prior study but unchanged in size. Punctate calculi in the left kidney are similar to the prior study (seen best on coronal images). There is no hydronephrosis. No stones are seen along the course of the ureters, which are nondilated.  The bowel is nondilated. The appendix is not identified, consistent with prior appendectomy. The uterus is absent. Bladder is unremarkable. No free fluid or enlarged lymph nodes are identified. Sequelae of prior left mastectomy are noted. Spinal cord stimulator is present. Focal subcutaneous density in the left gluteal region is unchanged. There is  S-shaped thoracolumbar scoliosis. Partial L3-4 vertebral body fusion and advanced disc degeneration at L4-5 are again seen.  IMPRESSION: Right larger than left renal calculi.  No hydronephrosis.   Electronically Signed   By: Logan Bores   On: 05/17/2014 17:23    Impression/Recommendation: Back pain in pt with known bilateral non obstructing renal stones:  Complex pt that I spent an hour with discussing sx, hx, and likely causes of her pain.  She does not have pyelonephritis  as shown by negative urine culture, normal WBC, and unchanged abdominal CT scan.  She was started on Rocephin and could take a 5 day of Cipro to complete an Abx course. Her renal stones are non obstructing with no hydro and, therefore, are also not the cause of her pain.  Her renal stones can continue to be followed conservatively as an outpt by Dr. Risa Leinberger.    Recommend IM consult her neurosurgeon Dr. Ellene Route regarding her back pain and spinal stimulator. IM has ordered an abdominal U/S which has not been completed yet.   Abdominal discomfort: She likely does have some bladder issues which are common in pts with back issues, chronic pain meds, spinal stimulators, DM, and MS.  She may not be completely emptying her bladder and appears to have some bladder spasms.  Check PVRs to ensure she is emptying.  Would not give anticholinergics for spasms as they can increase retention if present.  She may require urodynamics as an outpt in our office to completely eval her bladder function. She should f/u in our office with Dr. Risa Wolf regarding this after her current issues are resolved.      Marcie Bal 05/19/2014, 1:32 PM

## 2014-05-19 NOTE — Progress Notes (Signed)
Pt's BP is 190/82 manually checked by RN. MD paged to make aware. Pt in pain at this time and is also very anxious and tearful. Emotional support given to pt and pain medication given as well. Will recheck BP shortly to see if pain medication helps lower BP. Awaiting call back and or new orders at this time. Will continue to monitor.  Pt also wanted a note left for Urology MD that she does regularly urinate when having a bowel movement. She had an episode of this this afternoon. Pt c/o pressure prior to getting up to the bathroom and stated that the pressure was so bad that she felt like she just couldn't hold her bladder/urine any longer. However, when she got to the commode and sat down, the urge to urinate faded and she wasn't able at first. Pt sat there for a few minutes and then had to have a BM, that's when she stated that she was having urine to drain while having a BM. Will continue to monitor pt's output and check post void residuals per MD orders. First post void check was: 275 mL urine output, 160 mL post void residual per bladder scanner.  Othella Boyer [facility] 05/19/2014 4:07 PM

## 2014-05-19 NOTE — Progress Notes (Signed)
Voided 274ml.  Bladder scan performed, showed 164ml.  Coolidge Breeze, RN 05/19/2014

## 2014-05-19 NOTE — Progress Notes (Addendum)
Post void residual performed, 114ml in bladder. (Voided urine missed hat and could not be measured). Pt stated she still felt the urge to empty her bladder and attempted again - able to void 147ml.  Bladder scan repeated and 20ml result obtained. Urge to void relieved.  Coolidge Breeze, RN 05/19/2014

## 2014-05-19 NOTE — Progress Notes (Signed)
Care of pt assumed at this time. Pt denies any needs at this time.  Pt assessed, agree with previously charted assessment.  Will monitor.  Coolidge Breeze, RN 05/19/2014

## 2014-05-19 NOTE — Progress Notes (Signed)
[facility] PROGRESS NOTE  BRYNLEA SPINDLER QVZ:[id number] DOB: 07-18-1951 DOA: 05/17/2014 PCP:  Melinda Crutch, MD  Assessment/Plan: 1-Flank pain, bilateral worse right side: Pyelonephritis ? :  Patient presents with flank pain, UA with positive nitrates, dysuria. Continue with ceftriaxone day 2.  urine culture with no growth.  IV hydration with normal saline. Tylenol when necessary for fever. Pain control with IV Toradol when necessary and IV morphine when necessary. Continue home dose scheduled morphine that she takes for migraine headaches.  -Zofran when necessary for nausea.  -Pain persist, will check Abdomina US.  -CT scan abdomen and pelvis only showed kidney stones.  -Urology consulted.   Chronic renal stones  Follows with Dr. Risa Khanna. Right Kidney stone 8 mm. No signs of hydronephrosis.  Urology consulted.   Addison's disease  Vitals stable. Follows with Dr. Buddy Duty. Continue low-dose prednisone   Hypertension  Continue home dose metoprolol.   Hypothyroidism  Continue Synthroid   History of multiple sclerosis  Patient takes scheduled Robaxin 500 mg 4 times a day   GERD  Continue PPI  OSA on CPAP      Code Status: full code.  Family Communication: Care discussed with patient.  Disposition Plan: Remain inpatient.    Consultants:  none  Procedures:  [name];   Antibiotics:  Ceftriaxone 6-14  HPI/Subjective: Still with severe flank pain.  dysuria better. Had a Bowel movement.    Objective: Filed Vitals:   05/19/14 0536  BP: 161/79  Pulse: 62  Temp: 98 F (36.7 C)  Resp: 20    Intake/Output Summary (Last 24 hours) at 05/19/14 1003 Last data filed at 05/19/14 0704  Gross per 24 hour  Intake   2830 ml  Output    200 ml  Net   2630 ml   Filed Weights   05/17/14 1525 05/17/14 2234  Weight: 83.462 kg (184 lb) 86 kg (189 lb 9.5 oz)    Exam:   General:  No distress.   Cardiovascular: S 1, S 2 RRR  Respiratory:CTA  Abdomen: BS present, soft,  nt, BL CVA tenderness.   Musculoskeletal: no edema.   Data Reviewed: Basic Metabolic Panel:  Recent Labs Lab 05/17/14 1640 05/19/14 0430  NA 144 141  K 3.9 4.0  CL 103 104  CO2 32 29  GLUCOSE 97 114*  BUN 18 13  CREATININE 0.70 0.80  CALCIUM 8.9 8.3*   Liver Function Tests:  Recent Labs Lab 05/17/14 1640  AST 17  ALT 13  ALKPHOS 73  BILITOT <0.2*  PROT 6.9  ALBUMIN 3.7    Recent Labs Lab 05/17/14 1640  LIPASE 9*   No results found for this basename: AMMONIA,  in the last 168 hours CBC:  Recent Labs Lab 05/17/14 1640 05/19/14 0430  WBC 9.0 6.1  NEUTROABS 5.2  --   HGB 12.6 11.1*  HCT 38.2 34.1*  MCV 88.4 88.6  PLT 191 164   Cardiac Enzymes: No results found for this basename: CKTOTAL, CKMB, CKMBINDEX, TROPONINI,  in the last 168 hours BNP (last 3 results) No results found for this basename: PROBNP,  in the last 8760 hours CBG: No results found for this basename: GLUCAP,  in the last 168 hours  Recent Results (from the past 240 hour(s))  URINE CULTURE     Status: None   Collection Time    05/17/14  5:53 PM      Result Value Ref Range Status   Specimen Description URINE, CATHETERIZED   Final   Special  Requests NONE   Final   Culture  Setup Time     Final   Value: 05/17/2014 21:38     Performed at [facility]     Final   Value: NO GROWTH     Performed at [facility]   Culture     Final   Value: NO GROWTH     Performed at [facility]   Report Status 05/18/2014 FINAL   Final     Studies: Ct Abdomen Pelvis Wo Contrast  05/17/2014   CLINICAL DATA:  Bilateral flank pain, worse on the right. History of renal stones.  EXAM: CT ABDOMEN AND PELVIS WITHOUT CONTRAST  TECHNIQUE: Multidetector CT imaging of the abdomen and pelvis was performed following the standard protocol without IV contrast.  COMPARISON:  02/09/2014  FINDINGS: Calcified nodule in the right lower lobe is unchanged. Minimal scarring is noted in  the right middle lobe and lingula. There is no pleural effusion.  The gallbladder is surgically absent. Hepatic and splenic calcifications are unchanged and likely reflect prior granulomatous infection. The common bile duct measures up to 1.4 cm in diameter, similar to the prior study. No intrahepatic biliary dilatation is identified. The adrenal glands are unremarkable. Fatty infiltration of the pancreas is unchanged.  There is an 8 mm stone in the interpolar right kidney, in a different position than on the prior study but unchanged in size. Punctate calculi in the left kidney are similar to the prior study (seen best on coronal images). There is no hydronephrosis. No stones are seen along the course of the ureters, which are nondilated.  The bowel is nondilated. The appendix is not identified, consistent with prior appendectomy. The uterus is absent. Bladder is unremarkable. No free fluid or enlarged lymph nodes are identified. Sequelae of prior left mastectomy are noted. Spinal cord stimulator is present. Focal subcutaneous density in the left gluteal region is unchanged. There is S-shaped thoracolumbar scoliosis. Partial L3-4 vertebral body fusion and advanced disc degeneration at L4-5 are again seen.  IMPRESSION: Right larger than left renal calculi.  No hydronephrosis.   Electronically Signed   By: Logan Bores   On: 05/17/2014 17:23    Scheduled Meds: . B-complex with vitamin C  1 tablet Oral Daily  . budesonide-formoterol  2 puff Inhalation BID  . cefTRIAXone (ROCEPHIN)  IV  1 g Intravenous Q24H  . clonazePAM  1 mg Oral TID  . enoxaparin (LOVENOX) injection  40 mg Subcutaneous QHS  . levothyroxine  75 mcg Oral QHS  . methocarbamol  500 mg Oral 4 times per day  . metoprolol tartrate  12.5 mg Oral Daily  .  morphine injection  4 mg Intravenous Once  . pantoprazole  40 mg Oral Daily  . phenazopyridine  100 mg Oral TID WC  . predniSONE  5 mg Oral Daily  . senna  1 tablet Oral BID   Continuous  Infusions: . sodium chloride 100 mL/hr at 05/19/14 0620    Principal Problem:   Acute pyelonephritis Active Problems:   Addison's disease   Hypertension   Nausea & vomiting   Abdominal pain, left lower quadrant    Time spent: 35 minutes.     Niel Hummer A  [facility] Pager [phone]. If 7PM-7AM, please contact night-coverage at [email or web address], password [facility] 05/19/2014, 10:03 AM  LOS: 2 days

## 2014-05-19 NOTE — Consult Note (Signed)
Urology Consult  Referring physician:   Dr. Tyrell Antonio Reason for referral:   Back pain and kidney stones  Chief Complaint:   Abdominal pain  History of Present Illness:  Pt seen and examined with PA. Agree with assessment and plan.   Past Medical History  Diagnosis Date  . Asthma   . Hypertension   . Shingles   . Sleep apnea   . OSA on CPAP   . Adenocarcinoma of breast 07/13/1992  . Hypothyroidism   . H/O esophageal spasm     "I can have as many as 74 TUMS before they stop"  . Radiation adverse effect     "100% blockage on left; 50% on right salivary; can drain w/massage"  . Diabetes mellitus     "diet controlled"  . Restless leg syndrome, controlled 02/19/12    "much better since being on CPAP; been on that ~ 1 month now"  . COPD (chronic obstructive pulmonary disease)   . Pneumonia     "I've had it several times"  . Chronic bronchitis   . Positive TB test     "ran deep in my father's family; went on INH for awhile; it caused migraines"  . Adrenal insufficiency     "w/secondary Addison's"  . Anemia   . GERD (gastroesophageal reflux disease)   . H/O hiatal hernia   . Upper GI bleeding 1970-1980    "had 4"  . Hepatitis B infection 1971    "in high school; after drinking after football player"  . Migraines     "much better now" (02/19/12)  . Headache(784.0)     "when I don't eat" (02/19/12)  . Anxiety   . Arthritis   . Fibromyalgia   . Multiple sclerosis 05/1988    "dr's in [place] don't think I have it; dr's in [place] still do" (02/19/12)  . On home oxygen therapy     2 L at night  . Bruises easily   . Stress incontinence in female   . Addison's disease    Past Surgical History  Procedure Laterality Date  . Tonsillectomy and adenoidectomy  1964  . Mastectomy  2010    left  . Appendectomy  1960's  . Cholecystectomy  1980's  . Abdominal hysterectomy  1987  . Bilateral oophorectomy  1988  . Breast lumpectomy  07/13/1992    left  . Spinal cord stimulator  insertion  ~ 2004  . Back surgery  ~ 2004    "chipped disk out to put in permanent stimulator"  . Band hemorrhoidectomy  1979  . Salivary gland surgery  ~ 2002    "left was 100% blocked twice; right 50%; only had one surgery"  . Laparoscopy    . Portacath placement  10/30/2012    Procedure: INSERTION PORT-A-CATH;  Surgeon: Odis Hollingshead, MD;  Location: [facility];  Service: General;  Laterality: Right;  Right Ultrasound guided port-a-cath insertion  . Esophagogastroduodenoscopy N/A 04/17/2014    Procedure: ESOPHAGOGASTRODUODENOSCOPY (EGD);  Surgeon: Missy Sabins, MD;  Location: [facility];  Service: Endoscopy;  Laterality: N/A;  . Savory dilation N/A 04/17/2014    Procedure: SAVORY DILATION;  Surgeon: Missy Sabins, MD;  Location: [facility];  Service: Endoscopy;  Laterality: N/A;    Medications: I have reviewed the patient's current medications. Allergies:  Allergies  Allergen Reactions  . Compazine Other (See Comments)    Hyper and shaky  . Iohexol      Desc: ANAPHYLACTIC REACTION!-kmr-10/16/05   . Ivp Dye [  Iodinated Diagnostic Agents] Anaphylaxis and Other (See Comments)    Fever and blood clot  . Lisinopril Cough  . Metformin And Related     Migraine   . Metoclopramide Hcl Other (See Comments)    Hyper, shaky  . Promethazine Hcl Other (See Comments)    Hyper,,,,shaky  . Sulfa Antibiotics Swelling    Hallucinate   . Terbinafine Hcl Other (See Comments)    Throat closed up.  . Vancomycin Swelling  . Guaifenesin Other (See Comments)    hyper  . Amlodipine Swelling  . Dilaudid [Hydromorphone Hcl] Nausea And Vomiting    Headaches     Family History  Problem Relation Age of Onset  . Lung cancer Paternal Uncle     smoker  . Breast cancer Cousin 14    maternal cousin  . Throat cancer Cousin     maternal cousin   Social History:  reports that she has never smoked. She has never used smokeless tobacco. She reports that she does not drink alcohol or use illicit  drugs.  ROS: All systems are reviewed    Physical Exam:  Vital signs in last 24 hours: Temp:  [98 F (36.7 C)-98.4 F (36.9 C)] 98 F (36.7 C) (06/16 0536) Pulse Rate:  [62-91] 62 (06/16 0536) Resp:  [12-20] 20 (06/16 0536) BP: (138-161)/(60-79) 161/79 mmHg (06/16 0536) SpO2:  [90 %-98 %] 92 % (06/16 0938)  Cardiovascular: Skin warm; not flushed Respiratory: Breaths quiet; no shortness of breath Abdomen: No masses Neurological: Normal sensation to touch Musculoskeletal: Normal motor function arms and legs Lymphatics: No inguinal adenopathy Skin: No rashes Genitourinary:  Laboratory Data:  Results for orders placed during the hospital encounter of 05/17/14 (from the past 72 hour(s))  CBC WITH DIFFERENTIAL     Status: None   Collection Time    05/17/14  4:40 PM      Result Value Ref Range   WBC 9.0  4.0 - 10.5 K/uL   RBC 4.32  3.87 - 5.11 MIL/uL   Hemoglobin 12.6  12.0 - 15.0 g/dL   HCT 38.2  36.0 - 46.0 %   MCV 88.4  78.0 - 100.0 fL   MCH 29.2  26.0 - 34.0 pg   MCHC 33.0  30.0 - 36.0 g/dL   RDW 14.5  11.5 - 15.5 %   Platelets 191  150 - 400 K/uL   Neutrophils Relative % 58  43 - 77 %   Neutro Abs 5.2  1.7 - 7.7 K/uL   Lymphocytes Relative 31  12 - 46 %   Lymphs Abs 2.7  0.7 - 4.0 K/uL   Monocytes Relative 9  3 - 12 %   Monocytes Absolute 0.8  0.1 - 1.0 K/uL   Eosinophils Relative 2  0 - 5 %   Eosinophils Absolute 0.2  0.0 - 0.7 K/uL   Basophils Relative 0  0 - 1 %   Basophils Absolute 0.0  0.0 - 0.1 K/uL  COMPREHENSIVE METABOLIC PANEL     Status: Abnormal   Collection Time    05/17/14  4:40 PM      Result Value Ref Range   Sodium 144  137 - 147 mEq/L   Potassium 3.9  3.7 - 5.3 mEq/L   Chloride 103  96 - 112 mEq/L   CO2 32  19 - 32 mEq/L   Glucose, Bld 97  70 - 99 mg/dL   BUN 18  6 - 23 mg/dL   Creatinine, Ser 0.70  0.50 - 1.10 mg/dL   Calcium 8.9  8.4 - 10.5 mg/dL   Total Protein 6.9  6.0 - 8.3 g/dL   Albumin 3.7  3.5 - 5.2 g/dL   AST 17  0 - 37 U/L    ALT 13  0 - 35 U/L   Alkaline Phosphatase 73  39 - 117 U/L   Total Bilirubin <0.2 (*) 0.3 - 1.2 mg/dL   GFR calc non Af Amer >90  >90 mL/min   GFR calc Af Amer >90  >90 mL/min   Comment: (NOTE)     The eGFR has been calculated using the CKD EPI equation.     This calculation has not been validated in all clinical situations.     eGFR's persistently <90 mL/min signify possible Chronic Kidney     Disease.  LIPASE, BLOOD     Status: Abnormal   Collection Time    05/17/14  4:40 PM      Result Value Ref Range   Lipase 9 (*) 11 - 59 U/L  URINE CULTURE     Status: None   Collection Time    05/17/14  5:53 PM      Result Value Ref Range   Specimen Description URINE, CATHETERIZED     Special Requests NONE     Culture  Setup Time       Value: 05/17/2014 21:38     Performed at [facility]       Value: NO GROWTH     Performed at [facility]   Culture       Value: NO GROWTH     Performed at [facility]   Report Status 05/18/2014 FINAL    URINALYSIS, ROUTINE W REFLEX MICROSCOPIC     Status: Abnormal   Collection Time    05/17/14  5:53 PM      Result Value Ref Range   Color, Urine ORANGE (*) YELLOW   Comment: BIOCHEMICALS MAY BE AFFECTED BY COLOR   APPearance CLOUDY (*) CLEAR   Specific Gravity, Urine 1.033 (*) 1.005 - 1.030   pH 7.0  5.0 - 8.0   Glucose, UA NEGATIVE  NEGATIVE mg/dL   Hgb urine dipstick MODERATE (*) NEGATIVE   Bilirubin Urine SMALL (*) NEGATIVE   Ketones, ur 15 (*) NEGATIVE mg/dL   Protein, ur 30 (*) NEGATIVE mg/dL   Urobilinogen, UA 1.0  0.0 - 1.0 mg/dL   Nitrite POSITIVE (*) NEGATIVE   Leukocytes, UA SMALL (*) NEGATIVE  URINE MICROSCOPIC-ADD ON     Status: Abnormal   Collection Time    05/17/14  5:53 PM      Result Value Ref Range   Squamous Epithelial / LPF RARE  RARE   WBC, UA 0-2  <3 WBC/hpf   RBC / HPF TOO NUMEROUS TO COUNT  <3 RBC/hpf   Bacteria, UA MANY (*) RARE   Urine-Other MUCOUS PRESENT    CBC     Status:  Abnormal   Collection Time    05/19/14  4:30 AM      Result Value Ref Range   WBC 6.1  4.0 - 10.5 K/uL   RBC 3.85 (*) 3.87 - 5.11 MIL/uL   Hemoglobin 11.1 (*) 12.0 - 15.0 g/dL   HCT 34.1 (*) 36.0 - 46.0 %   MCV 88.6  78.0 - 100.0 fL   MCH 28.8  26.0 - 34.0 pg   MCHC 32.6  30.0 - 36.0 g/dL   RDW  14.0  11.5 - 15.5 %   Platelets 164  150 - 400 K/uL  BASIC METABOLIC PANEL     Status: Abnormal   Collection Time    05/19/14  4:30 AM      Result Value Ref Range   Sodium 141  137 - 147 mEq/L   Potassium 4.0  3.7 - 5.3 mEq/L   Chloride 104  96 - 112 mEq/L   CO2 29  19 - 32 mEq/L   Glucose, Bld 114 (*) 70 - 99 mg/dL   BUN 13  6 - 23 mg/dL   Creatinine, Ser 2.77  0.50 - 1.10 mg/dL   Calcium 8.3 (*) 8.4 - 10.5 mg/dL   GFR calc non Af Amer 78 (*) >90 mL/min   GFR calc Af Amer >90  >90 mL/min   Comment: (NOTE)     The eGFR has been calculated using the CKD EPI equation.     This calculation has not been validated in all clinical situations.     eGFR's persistently <90 mL/min signify possible Chronic Kidney     Disease.   Recent Results (from the past 240 hour(s))  URINE CULTURE     Status: None   Collection Time    05/17/14  5:53 PM      Result Value Ref Range Status   Specimen Description URINE, CATHETERIZED   Final   Special Requests NONE   Final   Culture  Setup Time     Final   Value: 05/17/2014 21:38     Performed at [facility]     Final   Value: NO GROWTH     Performed at [facility]   Culture     Final   Value: NO GROWTH     Performed at [facility]   Report Status 05/18/2014 FINAL   Final   Creatinine:  Recent Labs  05/17/14 1640 05/19/14 0430  CREATININE 0.70 0.80    Xrays: renal stones.  EXAM:  CT ABDOMEN AND PELVIS WITHOUT CONTRAST  TECHNIQUE:  Multidetector CT imaging of the abdomen and pelvis was performed  following the standard protocol without IV contrast.  COMPARISON: 02/09/2014  FINDINGS:  Calcified  nodule in the right lower lobe is unchanged. Minimal  scarring is noted in the right middle lobe and lingula. There is no  pleural effusion.  The gallbladder is surgically absent. Hepatic and splenic  calcifications are unchanged and likely reflect prior granulomatous  infection. The common bile duct measures up to 1.4 cm in diameter,  similar to the prior study. No intrahepatic biliary dilatation is  identified. The adrenal glands are unremarkable. Fatty infiltration  of the pancreas is unchanged.  There is an 8 mm stone in the interpolar right kidney, in a  different position than on the prior study but unchanged in size.  Punctate calculi in the left kidney are similar to the prior study  (seen best on coronal images). There is no hydronephrosis. No stones  are seen along the course of the ureters, which are nondilated.  The bowel is nondilated. The appendix is not identified, consistent  with prior appendectomy. The uterus is absent. Bladder is  unremarkable. No free fluid or enlarged lymph nodes are identified.  Sequelae of prior left mastectomy are noted. Spinal cord stimulator  is present. Focal subcutaneous density in the left gluteal region is  unchanged. There is S-shaped thoracolumbar scoliosis. Partial L3-4  vertebral body fusion and advanced disc  degeneration at L4-5 are  again seen.  IMPRESSION:  Right larger than left renal calculi. No hydronephrosis.  Electronically Signed  By: Logan Bores  On: 05/17/2014 17:23   Impression/Assessment:   kidney stones, followed by Dr. Risa Passe. No hydronephrosis. Not the cause of her current back pain. Pain is in the area of her neve stimulator. Pt may need neurosurgery consult for neurostimulator pain.   Plan:   No Urologic Rx needed at this time. Pt to follow-up with Dr. Risa Lybarger in future as necessary.  Re-consult as necessary.   TANNENBAUM, SIGMUND I 05/19/2014, 1:22 PM

## 2014-05-20 DIAGNOSIS — M549 Dorsalgia, unspecified: Secondary | ICD-10-CM

## 2014-05-20 DIAGNOSIS — N390 Urinary tract infection, site not specified: Secondary | ICD-10-CM

## 2014-05-20 MED ORDER — HEPARIN SOD (PORK) LOCK FLUSH 100 UNIT/ML IV SOLN
500.0000 [IU] | INTRAVENOUS | Status: DC | PRN
  Administered 2014-05-20: 500 [IU]
  Filled 2014-05-20: qty 5

## 2014-05-20 MED ORDER — SODIUM CHLORIDE 0.9 % IJ SOLN: 10.0000 mL | Freq: Two times a day (BID) | INTRAMUSCULAR | Status: DC

## 2014-05-20 MED ORDER — CEFUROXIME AXETIL 500 MG PO TABS: 500.0000 mg | ORAL_TABLET | Freq: Two times a day (BID) | ORAL | Status: AC

## 2014-05-20 MED ORDER — CEFUROXIME AXETIL 250 MG PO TABS: 250.0000 mg | ORAL_TABLET | Freq: Two times a day (BID) | ORAL | Status: DC

## 2014-05-20 MED ORDER — HEPARIN SOD (PORK) LOCK FLUSH 100 UNIT/ML IV SOLN
500.0000 [IU] | INTRAVENOUS | Status: DC
  Filled 2014-05-20: qty 5

## 2014-05-20 MED ORDER — METOPROLOL TARTRATE 12.5 MG HALF TABLET
12.5000 mg | ORAL_TABLET | Freq: Once | ORAL | Status: DC
  Filled 2014-05-20: qty 1

## 2014-05-20 MED ORDER — SODIUM CHLORIDE 0.9 % IJ SOLN
10.0000 mL | INTRAMUSCULAR | Status: DC | PRN
  Administered 2014-05-20: 10 mL

## 2014-05-20 NOTE — Discharge Summary (Signed)
Physician Discharge Summary  Daisy [name] BMW:[id number] DOB: 01/25/1951 DOA: 05/17/2014  PCP:  Melinda Crutch, MD  Admit date: 05/17/2014 Discharge date: 05/20/2014  Time spent: 70 minutes  Recommendations for Outpatient Follow-up:  1. Followup with  Melinda Crutch, MD in 1 week. All followup patient's blood pressure noted to be reassessed. Patient's chronic pain also need to be reassessed at that time. Patient will need a basic metabolic profile done to followup on electrolytes and renal function. 2. Followup with Dr. Risa Frontera of urology in one to 2 weeks.  3.  Followup with Dr. Ellene Route in 1-2 weeks for evaluation of spinal stimulator.  Discharge Diagnoses:  Principal Problem:   Acute pyelonephritis Active Problems:   Addison's disease   Hypertension   Nausea & vomiting   Abdominal pain, left lower quadrant   Discharge Condition: Stable and improved  Diet recommendation: Regular  Filed Weights   05/17/14 1525 05/17/14 2234  Weight: 83.462 kg (184 lb) 86 kg (189 lb 9.5 oz)    History of present illness:  63 year old female with history of asthma, hypertension, obstructive sleep apnea on CPAP, history of Addison's disease on chronic low-dose prednisone, history of adenocarcinoma of the breast , hypothyroidism, fibromyalgia, chronic migraines, history of multiple sclerosis, bilateral kidney stones (follows with Dr. Risa Perritt) presented to med [facility] today with complaints of lower abdominal pain radiating to the flanks bilaterally since 2 days back. Patient reports having crampy pain over her lower abdomen that was radiating to the flanks off-and-on. Yesterday she had a temperature of 101.67F at home and was feeling nauseous. She called her endocrinologist Dr. Buddy Duty who is back and she was not sure if this was worsening of her adrenal crisis. Patient denies any vomiting, but does report dysuria. Patient denies headache, dizziness, chest pain, palpitations, SOB, bowel or symptoms. Denies  change in weight or appetite.  Course in the ED  Patient's vitals were stable . Blood work done was unremarkable. UA was suggestive of UTI. CT scan of the abdomen and pelvis done in the ED showed unchanged size of a right-kidney 25mm stone . Also had numerous small left-sided kidneys without any hydronephrosis or abscess.  Condition given a dose of IV Rocephin and IV morphine without much improvement in her symptoms and hospitalists or for admission to a medical floor under observation.      Hospital Course:  #1 back pain/??? Pyelonephritis Patient had presented with flank pain urinalysis which was done was nitrite positive patient also noted some dysuria and suprapubic abdominal cramping. Patient was initially started on IV Rocephin IV fluids placed on Tylenol as needed for fever. Patient was also maintained on IV Toradol and IV morphine for pain control. Patient was also continued on on scheduled dose of morphine. This was also placed on Zofran for nausea. Patient was monitored and followed. CT of the abdomen and pelvis which was done showed right larger than left renal calculi with no hydronephrosis. Urine cultures which were obtained came back negative. Urology consultation was obtained and patient was seen in consultation by Dr. Gaynelle Arabian. It was felt that patient's back pain at the time of his examination was around the site of her spinal stimulator. It was felt per urology this was unlikely to be pyelonephritis his urine cultures were negative. Patient improved clinically. Patient's pain had improved and was close to her chronic pain. Patient was ambulating back and forth to the bathroom without any problems. Patient was insistent on being discharged home and patient be discharged home  on 5 days of oral Ceftin to complete a one-week course of antibiotic therapy. Patient will followup with PCP and Dr. Pearline Cables. Urology as outpatient. Patient be discharged in stable and improved condition.  #2  UTI Urinalysis which was done on admission was consistent with a UTI. Patient was noted to be spiking fevers and with some complaints of flank pain. Patient was also with complaints of suprapubic abdominal pain and dysuria. Patient was placed on IV Rocephin. Urine cultures were negative. Patient will be transitioned to oral Ceftin for 5 more days to complete a one-week course of antibiotic therapy. Patient be discharged in stable and improved condition.  #3 Addison's disease Remained stable during the hospitalization. Patient was maintained on the home regimen of low-dose prednisone.  #4 hypertension Patient was maintained on a home regimen of metoprolol. Patient will followup with PCP as outpatient.  #5 hypothyroidism Patient was maintained on a home regimen of Synthroid.  #6 history of multiple sclerosis Patient was maintained on the home scheduled Robaxin.  #7 gastroesophageal reflux disease Patient was maintained on a PPI.  The rest of patient's chronic medical issues remained stable throughout the hospitalization and patient was discharged in stable and improved condition.  Procedures:  CT abdomen and pelvis 05/17/2014  Abdominal ultrasound 05/19/2014  Consultations:  Urology: Dr. Gaynelle Arabian 05/19/2014  Discharge Exam: Filed Vitals:   05/20/14 1346  BP: 178/78  Pulse: 70  Temp: 98.3 F (36.8 C)  Resp: 18    General: NAD Cardiovascular: RRR Respiratory: CTAB  Discharge Instructions You were cared for by a hospitalist during your hospital stay. If you have any questions about your discharge medications or the care you received while you were in the hospital after you are discharged, you can call the unit and asked to speak with the hospitalist on call if the hospitalist that took care of you is not available. Once you are discharged, your primary care physician will handle any further medical issues. Please note that NO REFILLS for any discharge medications will be  authorized once you are discharged, as it is imperative that you return to your primary care physician (or establish a relationship with a primary care physician if you do not have one) for your aftercare needs so that they can reassess your need for medications and monitor your lab values.      Discharge Instructions   Diet general    Complete by:  As directed      Discharge instructions    Complete by:  As directed   Follow up with  Melinda Crutch, MD in 1 week. Follow up with Dr Risa Maskell in 1-2 weeks. Follow up with Dr Ellene Route in 1 week to assess stimulator.     Increase activity slowly    Complete by:  As directed             Medication List         B-complex with vitamin C tablet  Take 1 tablet by mouth daily.     budesonide-formoterol 160-4.5 MCG/ACT inhaler  Commonly known as:  SYMBICORT  Inhale 2 puffs into the lungs 2 (two) times daily as needed (for asthma).     Calcium Carbonate-Vitamin D 500-125 MG-UNIT Tabs  Take 1 tablet by mouth 2 (two) times daily.     cefUROXime 500 MG tablet  Commonly known as:  CEFTIN  Take 1 tablet (500 mg total) by mouth 2 (two) times daily with a meal. Take for 5 days then stop.  clonazePAM 1 MG tablet  Commonly known as:  KLONOPIN  Take 1 mg by mouth 3 (three) times daily.     furosemide 20 MG tablet  Commonly known as:  LASIX  Take 20 mg by mouth daily as needed (for fluid retention). For fluid     hydrOXYzine 50 MG tablet  Commonly known as:  ATARAX/VISTARIL  Take 50-100 mg by mouth 4 (four) times daily as needed for itching. itching     levalbuterol 1.25 MG/0.5ML nebulizer solution  Commonly known as:  XOPENEX  Take 1.25 mg by nebulization every 4 (four) hours as needed for wheezing or shortness of breath.     levalbuterol 45 MCG/ACT inhaler  Commonly known as:  XOPENEX HFA  Inhale 2 puffs into the lungs every 4 (four) hours as needed. Shortness of breath.     levothyroxine 75 MCG tablet  Commonly known as:  SYNTHROID,  LEVOTHROID  Take 75 mcg by mouth at bedtime.     magnesium chloride 64 MG Tbec SR tablet  Commonly known as:  SLOW-MAG  Take 1 tablet by mouth daily as needed (for fluid retention). With lasix     methocarbamol 500 MG tablet  Commonly known as:  ROBAXIN  Take 500 mg by mouth 4 (four) times daily. For spasms     metoprolol tartrate 25 MG tablet  Commonly known as:  LOPRESSOR  Take 12.5 mg by mouth as directed. 12.5 in the morning and if blood pressure is high in the afternoon she will take another     morphine 30 MG tablet  Commonly known as:  MSIR  Take 30 mg by mouth every 6 (six) hours as needed (for pain).     omeprazole 40 MG capsule  Commonly known as:  PRILOSEC  Take 40 mg by mouth 2 (two) times daily.     ondansetron 4 MG disintegrating tablet  Commonly known as:  ZOFRAN ODT  Take 1 tablet (4 mg total) by mouth every 6 (six) hours as needed for nausea or vomiting.     potassium chloride SA 20 MEQ tablet  Commonly known as:  K-DUR,KLOR-CON  Take 20 mEq by mouth daily as needed (fluid retention). Take with lasix     predniSONE 5 MG tablet  Commonly known as:  DELTASONE  Take 5 mg by mouth daily.     senna 8.6 MG Tabs tablet  Commonly known as:  SENOKOT  Take 1 tablet (8.6 mg total) by mouth 2 (two) times daily.       Allergies  Allergen Reactions  . Compazine Other (See Comments)    Hyper and shaky  . Iohexol      Desc: ANAPHYLACTIC REACTION!-kmr-10/16/05   . Ivp Dye [Iodinated Diagnostic Agents] Anaphylaxis and Other (See Comments)    Fever and blood clot  . Lisinopril Cough  . Metformin And Related     Migraine   . Metoclopramide Hcl Other (See Comments)    Hyper, shaky  . Promethazine Hcl Other (See Comments)    Hyper,,,,shaky  . Sulfa Antibiotics Swelling    Hallucinate   . Terbinafine Hcl Other (See Comments)    Throat closed up.  . Vancomycin Swelling  . Guaifenesin Other (See Comments)    hyper  . Amlodipine Swelling  . Dilaudid  [Hydromorphone Hcl] Nausea And Vomiting    Headaches    Follow-up Information   Follow up with  Melinda Crutch, MD. Schedule an appointment as soon as possible for a visit in 1 week.  Specialty:  Family Medicine   Contact information:   [address] [place] [address] [phone]       Follow up with [facility], MD. Schedule an appointment as soon as possible for a visit in 1 week.   Specialty:  Urology   Contact information:   [place] [address] [phone]       Follow up with Earleen Newport, MD. Schedule an appointment as soon as possible for a visit in 1 week. (f/u for stimulator to be assessed.)    Specialty:  Neurosurgery   Contact information:   [address] [place] [address] [phone]        The results of significant diagnostics from this hospitalization (including imaging, microbiology, ancillary and laboratory) are listed below for reference.    Significant Diagnostic Studies: Ct Abdomen Pelvis Wo Contrast  05/17/2014   CLINICAL DATA:  Bilateral flank pain, worse on the right. History of renal stones.  EXAM: CT ABDOMEN AND PELVIS WITHOUT CONTRAST  TECHNIQUE: Multidetector CT imaging of the abdomen and pelvis was performed following the standard protocol without IV contrast.  COMPARISON:  02/09/2014  FINDINGS: Calcified nodule in the right lower lobe is unchanged. Minimal scarring is noted in the right middle lobe and lingula. There is no pleural effusion.  The gallbladder is surgically absent. Hepatic and splenic calcifications are unchanged and likely reflect prior granulomatous infection. The common bile duct measures up to 1.4 cm in diameter, similar to the prior study. No intrahepatic biliary dilatation is identified. The adrenal glands are unremarkable. Fatty infiltration of the pancreas is unchanged.  There is an 8 mm stone in the interpolar right kidney, in a different position than on the prior study but  unchanged in size. Punctate calculi in the left kidney are similar to the prior study (seen best on coronal images). There is no hydronephrosis. No stones are seen along the course of the ureters, which are nondilated.  The bowel is nondilated. The appendix is not identified, consistent with prior appendectomy. The uterus is absent. Bladder is unremarkable. No free fluid or enlarged lymph nodes are identified. Sequelae of prior left mastectomy are noted. Spinal cord stimulator is present. Focal subcutaneous density in the left gluteal region is unchanged. There is S-shaped thoracolumbar scoliosis. Partial L3-4 vertebral body fusion and advanced disc degeneration at L4-5 are again seen.  IMPRESSION: Right larger than left renal calculi.  No hydronephrosis.   Electronically Signed   By: Logan Bores   On: 05/17/2014 17:23   US Abdomen Complete  05/19/2014   CLINICAL DATA:  Left flank pain radiating to the right abdomen. Nausea and vomiting. Diarrhea. Urinary frequency. History of renal calculi.  EXAM: ULTRASOUND ABDOMEN COMPLETE  COMPARISON:  05/17/2014 CT scan  FINDINGS: Gallbladder:  Surgically absent.  Common bile duct:  Diameter: 12 mm, without directly visualized choledocholithiasis.  Liver:  No intrahepatic biliary dilatation or focal lesion identified.  IVC:  No abnormality visualized.  Pancreas:  Visualized pancreatic body unremarkable. Pancreatic head and tail poorly seen due to overlying bowel gas.  Spleen:  Size and appearance within normal limits.  Right Kidney:  Length: 10.4 cm. Lower pole calculus, up to 10 mm in length, nonobstructive. No hydronephrosis.  Left Kidney:  Length: 10.3 cm in length. No definite calculus. Echogenicity within normal limits. No mass or hydronephrosis visualized.  Abdominal aorta:  No aneurysm visualized.  Other findings:  None  IMPRESSION: 1. Right kidney lower pole renal  calculus, nonobstructive. 2. Common bile duct is dilated to 12 mm. This may represent a physiologic  response to cholecystectomy ; on the prior exam from March, CBD diameter was about 11 mm. No intrahepatic biliary dilatation or directly visualized choledocholithiasis.   Electronically Signed   By: Sherryl Barters M.D.   On: 05/19/2014 22:02    Microbiology: Recent Results (from the past 240 hour(s))  URINE CULTURE     Status: None   Collection Time    05/17/14  5:53 PM      Result Value Ref Range Status   Specimen Description URINE, CATHETERIZED   Final   Special Requests NONE   Final   Culture  Setup Time     Final   Value: 05/17/2014 21:38     Performed at [facility]     Final   Value: NO GROWTH     Performed at [facility]   Culture     Final   Value: NO GROWTH     Performed at [facility]   Report Status 05/18/2014 FINAL   Final     Labs: Basic Metabolic Panel:  Recent Labs Lab 05/17/14 1640 05/19/14 0430  NA 144 141  K 3.9 4.0  CL 103 104  CO2 32 29  GLUCOSE 97 114*  BUN 18 13  CREATININE 0.70 0.80  CALCIUM 8.9 8.3*   Liver Function Tests:  Recent Labs Lab 05/17/14 1640  AST 17  ALT 13  ALKPHOS 73  BILITOT <0.2*  PROT 6.9  ALBUMIN 3.7    Recent Labs Lab 05/17/14 1640  LIPASE 9*   No results found for this basename: AMMONIA,  in the last 168 hours CBC:  Recent Labs Lab 05/17/14 1640 05/19/14 0430  WBC 9.0 6.1  NEUTROABS 5.2  --   HGB 12.6 11.1*  HCT 38.2 34.1*  MCV 88.4 88.6  PLT 191 164   Cardiac Enzymes: No results found for this basename: CKTOTAL, CKMB, CKMBINDEX, TROPONINI,  in the last 168 hours BNP: BNP (last 3 results) No results found for this basename: PROBNP,  in the last 8760 hours CBG: No results found for this basename: GLUCAP,  in the last 168 hours     Signed:  [facility] MD [facility] 05/20/2014, 2:51 PM

## 2014-05-21 ENCOUNTER — Telehealth: Payer: Self-pay | Admitting: *Deleted

## 2014-05-21 ENCOUNTER — Other Ambulatory Visit: Payer: Self-pay | Admitting: Oncology

## 2014-05-21 NOTE — Telephone Encounter (Signed)
Patient called and left voicemail to let us know that she has been very sick in hospital with a kidney infection. (Information in EPIC) She also received a letter that her Mammogram at [facility] cannot be done until after 06/02/2014,due to insurance, so she has rescheduled to 06/16/2014.  Will forward information to Dr Marko Plume to determine if previously scheduled appt for 05/27/14 should be rescheduled due to delay in mammo. Will call patient back if any changes need to be made.

## 2014-05-21 NOTE — Consult Note (Signed)
See separate note. Seen and agreed wit PA.

## 2014-05-22 ENCOUNTER — Telehealth: Payer: Self-pay | Admitting: Oncology

## 2014-05-22 NOTE — Telephone Encounter (Signed)
, °

## 2014-05-25 ENCOUNTER — Telehealth: Payer: Self-pay | Admitting: Oncology

## 2014-05-25 NOTE — Telephone Encounter (Signed)
s.w. pt advised on cx appt...pt advised that she did have blood work  and flush in hospital and cx all 6.24 appts....pt sched mammo @ [facility] on 7.14 @ 2pm..Marland KitchenI advised pt on Aug appt...pt ok and aware

## 2014-05-27 ENCOUNTER — Ambulatory Visit: Payer: Self-pay | Admitting: Oncology

## 2014-05-27 ENCOUNTER — Other Ambulatory Visit: Payer: Self-pay

## 2014-05-29 ENCOUNTER — Other Ambulatory Visit (HOSPITAL_COMMUNITY): Payer: Self-pay | Admitting: Anesthesiology

## 2014-05-29 ENCOUNTER — Ambulatory Visit (HOSPITAL_COMMUNITY)
Admission: RE | Admit: 2014-05-29 | Discharge: 2014-05-29 | Disposition: A | Discharge status: Home / Self Care | Payer: No Typology Code available for payment source | Source: Ambulatory Visit | Attending: Anesthesiology | Admitting: Anesthesiology

## 2014-05-29 DIAGNOSIS — Y92009 Unspecified place in unspecified non-institutional (private) residence as the place of occurrence of the external cause: Principal | ICD-10-CM

## 2014-05-29 DIAGNOSIS — M4120 Other idiopathic scoliosis, site unspecified: Secondary | ICD-10-CM | POA: Insufficient documentation

## 2014-05-29 DIAGNOSIS — M47817 Spondylosis without myelopathy or radiculopathy, lumbosacral region: Secondary | ICD-10-CM | POA: Insufficient documentation

## 2014-05-29 DIAGNOSIS — W19XXXA Unspecified fall, initial encounter: Secondary | ICD-10-CM

## 2014-05-29 DIAGNOSIS — N200 Calculus of kidney: Secondary | ICD-10-CM | POA: Insufficient documentation

## 2014-05-29 DIAGNOSIS — S32009A Unspecified fracture of unspecified lumbar vertebra, initial encounter for closed fracture: Secondary | ICD-10-CM | POA: Insufficient documentation

## 2014-06-03 ENCOUNTER — Other Ambulatory Visit (INDEPENDENT_AMBULATORY_CARE_PROVIDER_SITE_OTHER): Payer: Self-pay | Admitting: General Surgery

## 2014-06-15 ENCOUNTER — Telehealth: Payer: Self-pay

## 2014-06-15 NOTE — Telephone Encounter (Signed)
Returned pt call - reports falling 3 times resulting in back fracture.  Pt restricted to bed with a lot of pain.  Appts that were scheduled on June 24 had been cancelled and rescheduled to 8/19.    Patient discharged from hospital on June 17.  Pt mammo scheduled for 07/08/14.  Pt concerned that time between flushes would be too long - assured patient 6-8 weeks is a reasonable amount of time.   Pt reports per [facility] that pt needs to find out if 3d mammograms covered under her insurance and/or does mammo/3d mammo have to be precertified.  The patient's insurance has changed.    [place] subscriber name: MERIS REEDE if: [id number] [place] phone no: [phone]  I let patient know I would forward information to managed care.  Pt voiced understanding and appreciation.    Routed to [employer].

## 2014-06-15 NOTE — Telephone Encounter (Signed)
Pt called to advise

## 2014-06-15 NOTE — Telephone Encounter (Signed)
This is a Nurse, adult patient

## 2014-06-25 ENCOUNTER — Telehealth: Payer: Self-pay | Admitting: Neurology

## 2014-06-25 NOTE — Telephone Encounter (Signed)
Patient has concerns about her oxygen costs for the CPAP at $186 per month - she would like a call back to discuss her plans of purchasing a unit on the Internet.

## 2014-06-25 NOTE — Telephone Encounter (Signed)
Please advise previous note. Thanks  °

## 2014-06-29 NOTE — Telephone Encounter (Signed)
Returned call to patient who says that her husband has concerns regarding her oxygen costs.  I explained to the patient that there were no restrictions if they choose to go through a medical supply company as opposed to DME.  However, this would require an updated prescription.  She has not been seen in follow up since 2013.  I advised the patient that she needed to schedule her compliance visit.  She says that she is homebound with a fractured back.  She explains that she and Dr.Dohmeier are very good friends and she is sure that Dr. Brett Fairy would rather call and speak with her on the phone.  Advised the pt that Dr. Brett Fairy would be out of the office today, but returning tomorrow at which time she can address this request.  Number to call is # [phone]

## 2014-06-29 NOTE — Telephone Encounter (Signed)
Pt's information was given to [place], lead lab coordinator and Trenda Moots stated that she would be giving the pt a call back soon.

## 2014-06-29 NOTE — Telephone Encounter (Signed)
Patient hadn't received a call back.  Please call and advise.

## 2014-06-30 ENCOUNTER — Telehealth: Payer: Self-pay | Admitting: *Deleted

## 2014-06-30 NOTE — Telephone Encounter (Signed)
Patient left voicemail stating she is having increased back pain and is unsure if can keep appointment for mammogram on 07/08/14. Called patient back and encouraged her to keep appointment for her mammogram. Told patient if she is unable to get mammogram to call us and let us know since she has an appointment with Dr. Marko Plume later in August. Patient very appreciative of the call and will call us with any further concerns.

## 2014-07-01 ENCOUNTER — Encounter: Payer: Self-pay | Admitting: *Deleted

## 2014-07-01 NOTE — Telephone Encounter (Signed)
Patient will be ready to come to the office in October. Sh had skin cancer, vertigo and multiple falls over the last 12 month, is considered home bound. DME   The 02 costs are 180 /month. She would buy the machine for 600 USD and own it.

## 2014-07-21 ENCOUNTER — Telehealth: Payer: Self-pay

## 2014-07-21 ENCOUNTER — Other Ambulatory Visit: Payer: Self-pay | Admitting: Oncology

## 2014-07-21 DIAGNOSIS — D509 Iron deficiency anemia, unspecified: Secondary | ICD-10-CM

## 2014-07-21 DIAGNOSIS — D0512 Intraductal carcinoma in situ of left breast: Secondary | ICD-10-CM

## 2014-07-21 NOTE — Telephone Encounter (Signed)
Message copied by Baruch Merl on Tue Jul 21, 2014  5:02 PM ------      Message from: Gordy Levan      Created: Tue Jul 21, 2014  2:41 PM       Barbaraann Share -- could you please try to move her off of 8-19 ? Just any open spot in next few months is fine.      Thanks      L. ------

## 2014-07-21 NOTE — Telephone Encounter (Signed)
Ms. Busick will keep lab and flush appointment for 07-22-14.  The office visit was r/s to 09-02-14 with Next PAC flush.

## 2014-07-22 ENCOUNTER — Ambulatory Visit (HOSPITAL_BASED_OUTPATIENT_CLINIC_OR_DEPARTMENT_OTHER): Payer: BC Managed Care – PPO

## 2014-07-22 ENCOUNTER — Ambulatory Visit: Payer: Self-pay | Admitting: Oncology

## 2014-07-22 DIAGNOSIS — Z95828 Presence of other vascular implants and grafts: Secondary | ICD-10-CM

## 2014-07-22 DIAGNOSIS — D0510 Intraductal carcinoma in situ of unspecified breast: Secondary | ICD-10-CM

## 2014-07-22 DIAGNOSIS — Z452 Encounter for adjustment and management of vascular access device: Secondary | ICD-10-CM

## 2014-07-22 DIAGNOSIS — D509 Iron deficiency anemia, unspecified: Secondary | ICD-10-CM

## 2014-07-22 DIAGNOSIS — Z853 Personal history of malignant neoplasm of breast: Secondary | ICD-10-CM

## 2014-07-22 LAB — CBC WITH DIFFERENTIAL/PLATELET
BASO%: 1.1 % (ref 0.0–2.0)
Basophils Absolute: 0.1 10*3/uL (ref 0.0–0.1)
EOS%: 1.9 % (ref 0.0–7.0)
Eosinophils Absolute: 0.1 10*3/uL (ref 0.0–0.5)
HCT: 42.8 % (ref 34.8–46.6)
HGB: 13.2 g/dL (ref 11.6–15.9)
LYMPH%: 21.6 % (ref 14.0–49.7)
MCH: 26.4 pg (ref 25.1–34.0)
MCHC: 30.7 g/dL — AB (ref 31.5–36.0)
MCV: 85.8 fL (ref 79.5–101.0)
MONO#: 0.3 10*3/uL (ref 0.1–0.9)
MONO%: 4.9 % (ref 0.0–14.0)
NEUT#: 4.8 10*3/uL (ref 1.5–6.5)
NEUT%: 70.5 % (ref 38.4–76.8)
Platelets: 212 10*3/uL (ref 145–400)
RBC: 4.99 10*6/uL (ref 3.70–5.45)
RDW: 15.5 % — ABNORMAL HIGH (ref 11.2–14.5)
WBC: 6.8 10*3/uL (ref 3.9–10.3)
lymph#: 1.5 10*3/uL (ref 0.9–3.3)

## 2014-07-22 LAB — COMPREHENSIVE METABOLIC PANEL (CC13)
ALK PHOS: 78 U/L (ref 40–150)
ALT: 15 U/L (ref 0–55)
ANION GAP: 9 meq/L (ref 3–11)
AST: 22 U/L (ref 5–34)
Albumin: 3.6 g/dL (ref 3.5–5.0)
BUN: 18.4 mg/dL (ref 7.0–26.0)
CO2: 29 meq/L (ref 22–29)
CREATININE: 0.8 mg/dL (ref 0.6–1.1)
Calcium: 8.9 mg/dL (ref 8.4–10.4)
Chloride: 106 mEq/L (ref 98–109)
Glucose: 110 mg/dl (ref 70–140)
Potassium: 4.8 mEq/L (ref 3.5–5.1)
SODIUM: 144 meq/L (ref 136–145)
Total Protein: 6.8 g/dL (ref 6.4–8.3)

## 2014-07-22 MED ORDER — HEPARIN SOD (PORK) LOCK FLUSH 100 UNIT/ML IV SOLN
500.0000 [IU] | Freq: Once | INTRAVENOUS | Status: AC
  Administered 2014-07-22: 500 [IU] via INTRAVENOUS
  Filled 2014-07-22: qty 5

## 2014-07-22 MED ORDER — SODIUM CHLORIDE 0.9 % IJ SOLN
10.0000 mL | INTRAMUSCULAR | Status: DC | PRN
  Administered 2014-07-22: 10 mL via INTRAVENOUS
  Filled 2014-07-22: qty 10

## 2014-07-22 NOTE — Patient Instructions (Signed)

## 2014-08-18 ENCOUNTER — Encounter: Payer: Self-pay | Admitting: *Deleted

## 2014-08-20 ENCOUNTER — Telehealth: Payer: Self-pay | Admitting: *Deleted

## 2014-08-20 NOTE — Telephone Encounter (Signed)
Order for mammogram faxed to [facility]. Original sent to HIM to be scanned into patient's chart.

## 2014-08-26 ENCOUNTER — Telehealth: Payer: Self-pay | Admitting: *Deleted

## 2014-08-26 ENCOUNTER — Other Ambulatory Visit: Payer: Self-pay | Admitting: Radiology

## 2014-08-26 NOTE — Telephone Encounter (Signed)
Received copy of mammogram report from [facility]. Copy given to Dr. Marko Plume for review and original sent to HIM to be scanned.

## 2014-08-26 NOTE — Telephone Encounter (Signed)
Faxed signed order to [facility] for US guided biopsy. Original sent to HIM to be scanned into patient's chart.

## 2014-08-29 ENCOUNTER — Other Ambulatory Visit: Payer: Self-pay | Admitting: Oncology

## 2014-09-02 ENCOUNTER — Other Ambulatory Visit (HOSPITAL_BASED_OUTPATIENT_CLINIC_OR_DEPARTMENT_OTHER): Payer: BC Managed Care – PPO

## 2014-09-02 ENCOUNTER — Ambulatory Visit (HOSPITAL_BASED_OUTPATIENT_CLINIC_OR_DEPARTMENT_OTHER): Payer: BC Managed Care – PPO

## 2014-09-02 ENCOUNTER — Ambulatory Visit (HOSPITAL_BASED_OUTPATIENT_CLINIC_OR_DEPARTMENT_OTHER): Payer: BC Managed Care – PPO | Admitting: Oncology

## 2014-09-02 ENCOUNTER — Encounter: Payer: Self-pay | Admitting: Oncology

## 2014-09-02 VITALS — BP 142/81 | HR 89 | Temp 98.3°F | Resp 18 | Ht 62.0 in | Wt 186.2 lb

## 2014-09-02 DIAGNOSIS — Z95828 Presence of other vascular implants and grafts: Secondary | ICD-10-CM

## 2014-09-02 DIAGNOSIS — Z853 Personal history of malignant neoplasm of breast: Secondary | ICD-10-CM

## 2014-09-02 DIAGNOSIS — D0512 Intraductal carcinoma in situ of left breast: Secondary | ICD-10-CM

## 2014-09-02 DIAGNOSIS — Z452 Encounter for adjustment and management of vascular access device: Secondary | ICD-10-CM

## 2014-09-02 LAB — COMPREHENSIVE METABOLIC PANEL (CC13)
ALT: 17 U/L (ref 0–55)
AST: 24 U/L (ref 5–34)
Albumin: 3.9 g/dL (ref 3.5–5.0)
Alkaline Phosphatase: 74 U/L (ref 40–150)
Anion Gap: 8 mEq/L (ref 3–11)
BILIRUBIN TOTAL: 0.28 mg/dL (ref 0.20–1.20)
BUN: 19.6 mg/dL (ref 7.0–26.0)
CALCIUM: 9.6 mg/dL (ref 8.4–10.4)
CHLORIDE: 103 meq/L (ref 98–109)
CO2: 30 meq/L — AB (ref 22–29)
Creatinine: 0.8 mg/dL (ref 0.6–1.1)
Glucose: 96 mg/dl (ref 70–140)
Potassium: 4.5 mEq/L (ref 3.5–5.1)
Sodium: 141 mEq/L (ref 136–145)
Total Protein: 7.3 g/dL (ref 6.4–8.3)

## 2014-09-02 LAB — CBC WITH DIFFERENTIAL/PLATELET
BASO%: 0.8 % (ref 0.0–2.0)
Basophils Absolute: 0.1 10*3/uL (ref 0.0–0.1)
EOS%: 1.5 % (ref 0.0–7.0)
Eosinophils Absolute: 0.1 10*3/uL (ref 0.0–0.5)
HCT: 46 % (ref 34.8–46.6)
HGB: 14.4 g/dL (ref 11.6–15.9)
LYMPH#: 2.1 10*3/uL (ref 0.9–3.3)
LYMPH%: 22.8 % (ref 14.0–49.7)
MCH: 26.2 pg (ref 25.1–34.0)
MCHC: 31.3 g/dL — AB (ref 31.5–36.0)
MCV: 83.9 fL (ref 79.5–101.0)
MONO#: 0.7 10*3/uL (ref 0.1–0.9)
MONO%: 7.8 % (ref 0.0–14.0)
NEUT#: 6 10*3/uL (ref 1.5–6.5)
NEUT%: 67.1 % (ref 38.4–76.8)
Platelets: 243 10*3/uL (ref 145–400)
RBC: 5.49 10*6/uL — ABNORMAL HIGH (ref 3.70–5.45)
RDW: 14.2 % (ref 11.2–14.5)
WBC: 9 10*3/uL (ref 3.9–10.3)

## 2014-09-02 MED ORDER — SODIUM CHLORIDE 0.9 % IJ SOLN
10.0000 mL | INTRAMUSCULAR | Status: DC | PRN
  Administered 2014-09-02: 10 mL via INTRAVENOUS
  Filled 2014-09-02: qty 10

## 2014-09-02 MED ORDER — HEPARIN SOD (PORK) LOCK FLUSH 100 UNIT/ML IV SOLN
500.0000 [IU] | Freq: Once | INTRAVENOUS | Status: AC
  Administered 2014-09-02: 500 [IU] via INTRAVENOUS
  Filled 2014-09-02: qty 5

## 2014-09-02 NOTE — Progress Notes (Signed)
OFFICE PROGRESS NOTE   09/02/2014   Physicians:C.Melinda Crutch, T.Rosenbower, J.Hayes, Clarita Leber [facility])/ Rosita Fire, Nicholaus Bloom, H.Elsner   INTERVAL HISTORY:  Patient is seen, alone for visit, in follow up of stage 1 left breast cancer and subsequent left DCIS presenting as Paget's disease, on observation since she was unable to tolerate tamoxifen following mastectomy. She had right tomo mammogram and [name] at [facility] 08-25-14, with concern for change in upper outer quadrant and right lower outer quadrant, with biopsy on 08-26-14 showing only fibrocystic changes [facility] [phone]). Note at time of biopsy only a single abnormality could be visualized. She had complained of nonlocalized pain in right breast prior to the recent imaging and biopsy, and had extensive bruising after the biopsy. She tells me that she would like right mastectomy, and plans to schedule with Dr Zella Richer to discuss this. I have mentioned that she is at higher risk for surgical complications with her other medical problems.   She has PAC, which is flushed every 6-8 weeks when not otherwise used. This was placed by Dr Zella Richer in 10-2012 during admission for Addison's crisis. She has multiple significant medical problems, was hospitalized in 05-2014 with pyelonephritis. She has changed endocrine follow up from Dr Clarita Leber at [facility] to Dr Rosita Fire, as travel to [facility] was difficult. She fractured L1 in June 2015 in fall at home; Dr Ellene Route did not feel kyphoplasty would be useful. She had erosive esophagitis on EGD 04-2014 by Dr Amedeo Plenty  She is to have flu vaccine by PCP   ONCOLOGIC HISTORY Oncologic history is of DCIS left breast presenting with Paget's disease in July 2010, treated with left mastectomy; with multiple other co-morbidities, she has not been on additional hormonal blockers. She additionally has history of T1N0 ER/PR positive left breast cancer in 1993, treated with lumpectomy and axillary node  evaluation and adjuvant adriamycin/ cytoxan by Dr.Neijstrom, local radiation, then unable to tolerate tamoxifen due to migraines. Most recent mammograms were at [facility] 06-02-2013 with scattered fibroglandular tissue and no mammographic findings of concern.    Review of systems as above, also: No other bleeding or unusual bruising. No recent fever or symptoms of infection. No other new or different pain otherwise. She is now off oxycontin, continues morphine from pain clinic. She has felt much better since able to lose some weight. No increased SOB.  Remainder of 10 point Review of Systems negative.  Objective:  Vital signs in last 24 hours:  BP 142/81  Pulse 89  Temp(Src) 98.3 F (36.8 C) (Oral)  Resp 18  Ht 5\' 2"  (1.575 m)  Wt 186 lb 3.2 oz (84.46 kg)  BMI 34.05 kg/m2 Weight down 20 lbs from 10-2013. Alert, oriented and appropriate. Ambulatory slowly without assistance.  HEENT:PERRL, sclerae not icteric. Oral mucosa moist without lesions, posterior pharynx clear.  Neck supple. No JVD.  Lymphatics:no cervical,supraclavicular, axillary adenopathy Resp: clear to auscultation bilaterally and normal percussion bilaterally Cardio: regular rate and rhythm. No gallop. GI: abdomen obese, soft, nontender, not distended, no appreciable mass or organomegaly. Normally active bowel sounds.  Musculoskeletal/ Extremities: without pitting edema, cords, tenderness Neuro: speech fluent and appropriate. Moves all extremeites equally. PSYCH appropriate mood and affect Skin otherwise without rash, ecchymosis, petechiae Breasts: left  Mastectomy scar without evidence of local recurrence. Right breast extensive confluent ecchymosis beginning at discreet straight line superiorly and involving rest of breast into axilla. No heat or fluctuance, cannot appreciate andy specific mass. Axillae benign. Portacath-without erythema or tenderness, flushed without difficulty  today.  Lab Results:  Results for orders  placed in visit on 09/02/14  CBC WITH DIFFERENTIAL      Result Value Ref Range   WBC 9.0  3.9 - 10.3 10e3/uL   NEUT# 6.0  1.5 - 6.5 10e3/uL   HGB 14.4  11.6 - 15.9 g/dL   HCT 46.0  34.8 - 46.6 %   Platelets 243  145 - 400 10e3/uL   MCV 83.9  79.5 - 101.0 fL   MCH 26.2  25.1 - 34.0 pg   MCHC 31.3 (*) 31.5 - 36.0 g/dL   RBC 5.49 (*) 3.70 - 5.45 10e6/uL   RDW 14.2  11.2 - 14.5 %   lymph# 2.1  0.9 - 3.3 10e3/uL   MONO# 0.7  0.1 - 0.9 10e3/uL   Eosinophils Absolute 0.1  0.0 - 0.5 10e3/uL   Basophils Absolute 0.1  0.0 - 0.1 10e3/uL   NEUT% 67.1  38.4 - 76.8 %   LYMPH% 22.8  14.0 - 49.7 %   MONO% 7.8  0.0 - 14.0 %   EOS% 1.5  0.0 - 7.0 %   BASO% 0.8  0.0 - 2.0 %   CMET available after visit normal with exception of CO2 30  Studies/Results: SOLIS tomo right mammogram 08-25-14 no findings of concern [name] 08-25-14 with irregular hypoechoic mass right breast upper outer quadrant and 1.3 cm irregular mass right lower outer quadrant which reportedly correlated with pain complaints. She had US biopsy of the right upper outer mass at [facility] 08-25-14,    PATHOLOGY atient: MEENAKSHI, SAZAMA Collected: 08/26/2014 Client: [facility] [place] Accession: [id number] Received: 08/26/2014 Christene Slates, MDAL PATHOLOGY FINAL DIAGNOSIS Diagnosis Breast, right, needle core biopsy, (A) 9 o'clock - FIBROCYSTIC CHANGES. - THERE IS NO EVIDENCE OF MALIGNANCY. - SEE COMMENT. Microscopic Comment The findings may not be entirely representative of the radiologically detected lesion. The results were called to [facility] on 08/27/2014. (JBK:ecj 08/27/2014) Enid Cutter MD  Medications: I have reviewed the patient's current medications.  DISCUSSION: I have encouraged her to continue with weight loss. She will discuss prophylactic right mastectomy with Dr Zella Richer.   Assessment/Plan: 1. Stage 1 left breast cancer treated which chemo and radiation, and subsequent left breast DCIS: post left  mastectomy. No known active malignancy 2. Benign right breast biopsy 08-26-14, with extensive bruising from that procedure.  3.Addison's disease on chronic steroids, now followed by Dr Buddy Duty 4.hospitalization [phone] with pyelonephritis 5.recent L1 fracture: improving with conservative care.  6.several falls at home 7.PAC in, which needs to be flushed every 8 weeks when not otherwise being used. 8.obesity 9. Erosive esophagitis at upper endoscopy 04-2012 10. Chronic pain, followed at pain clinic .   All questions answered. Patient is in agreement with plans and will call if needed prior to next scheduled visit. Time spent 25 min including >50% counseling and coordination of care.    Kwali Wrinkle P, MD   09/02/2014, 2:15 PM

## 2014-09-02 NOTE — Patient Instructions (Signed)

## 2014-09-03 ENCOUNTER — Telehealth: Payer: Self-pay

## 2014-09-03 HISTORY — PX: MASTECTOMY: SHX3

## 2014-09-03 NOTE — Telephone Encounter (Signed)
Told Daisy [name] that an order was sent to the schedulers for PAC flushes.  She needs to call  The schedulers if she does not hear about an appointment with in the next 3 weeks.  Ms. Heldman verbalized understanding.

## 2014-09-03 NOTE — Telephone Encounter (Signed)
Ms. Nantz called stating that she did not have an appointment for a porta-cath flush in 8 weeks. It was last flushed yesterday 09-02-14

## 2014-09-08 ENCOUNTER — Encounter: Payer: Self-pay | Admitting: Oncology

## 2014-09-14 ENCOUNTER — Telehealth: Payer: Self-pay | Admitting: Oncology

## 2014-09-14 NOTE — Telephone Encounter (Signed)
per pt req to mail copy of sch-maied out

## 2014-09-17 ENCOUNTER — Encounter: Payer: Self-pay | Admitting: Oncology

## 2014-09-17 ENCOUNTER — Encounter (INDEPENDENT_AMBULATORY_CARE_PROVIDER_SITE_OTHER): Payer: Self-pay | Admitting: General Surgery

## 2014-09-17 NOTE — Progress Notes (Signed)
Patient ID: Daisy [name], female   DOB: 1951/10/14, 63 y.o.   MRN: [id number]  Daisy [name] 09/17/2014 1:58 PM Location: [facility] Patient #: [id number] DOB: 1951-02-26 Married / Language: Cleophus Molt / Race: White Female History of Present Illness Daisy Hollingshead MD; 09/17/2014 6:09 PM) Patient words: evaluation of right breast.  The patient is a 63 year old female    Note:She presents today to discuss a right mastectomy. She has a personal history of left breast cancer. She has been having some right breast pain. She also notices a fullness in the lateral aspect of the right breast. Mammogram and ultrasound demonstrated a regular masses in the upper outer quadrant and lower outer quadrant of the right breast. Biopsy of the upper outer quadrant mass demonstrated benign fibrocystic changes. The mass persists. Because of the mass, her personal history of breast cancer, and the pain, she would like to have an elective prophylactic mastectomy. She states her Addison's disease is well controlled.  Other Problems Daisy Hollingshead, MD; 09/17/2014 6:09 PM) Asthma Back Pain Breast Cancer Gastroesophageal Reflux Disease Kidney Stone Lump In Breast Oophorectomy Bilateral. Thyroid Disease ADDISONS DISEASE (255.41  E27.1)  Past Surgical History Ventura Sellers, [place]; 09/17/2014 1:59 PM) Appendectomy Breast Biopsy Bilateral. multiple Breast Mass; Local Excision Left. Gallbladder Surgery - Laparoscopic Hysterectomy (not due to cancer) - Complete Mastectomy Left. Oral Surgery Spinal Surgery - Neck Tonsillectomy  Diagnostic Studies History Ventura Sellers, [place]; 09/17/2014 1:59 PM) Colonoscopy 5-10 years ago Mammogram within last year  Allergies Ventura Sellers, CMA; 09/17/2014 2:09 PM) Leroy Sea *DERMATOLOGICALS* sick Mucinex *COUGH/COLD/ALLERGY* shaky Ambien *HYPNOTICS/SEDATIVES/SLEEP DISORDER AGENTS* shacky Amoxicillin  *PENICILLINS* Diarrhea. Biaxin *MACROLIDES* MetFORMIN HCl *CHEMICALS* migraines Compazine *ANTIPSYCHOTICS/ANTIMANIC AGENTS* anxious, shaky Vancomycin HCl *ANTI-INFECTIVE AGENTS - MISC.* Swelling. Dilaudid *ANALGESICS - OPIOID* Vomiting. Tussin *COUGH/COLD/ALLERGY* head ache Augmentin *PENICILLINS* bad head ache Sulfa 10 *OPHTHALMIC AGENTS* hallucinations Phenergan *ANTIHISTAMINES* anxiety Lisinopril *ANTIHYPERTENSIVES* Cough. LamISIL *ANTIFUNGALS* Anaphylaxis. Reglan *GASTROINTESTINAL AGENTS - MISC.* hyperactivity Iodinated Contrast Media Anaphylaxis.  Medication History Ventura Sellers, [place]; 09/17/2014 2:13 PM) Losartan Potassium (50MG  Tablet, Oral) Active. Metoprolol Tartrate (25MG  Tablet, Oral) Active. ClonazePAM (1MG  Tablet, Oral) Active. HydrOXYzine HCl (50MG  Tablet, Oral) Active. Morphine Sulfate (30MG  Tablet, Oral) Active. Synthroid (75MCG Tablet, Oral) Active. Omeprazole (40MG  Capsule DR, Oral) Active. PredniSONE (5MG  Tablet, Oral) Active. Robaxin (500MG  Tablet, Oral) Active. Vitamin B Complex (Oral) Active. Senokot (8.6MG  Tablet, Oral) Active. Viactiv (500-500-40MG -UNT-MCG Tablet Chewable, Oral) Active. Symbicort (160-4.5MCG/ACT Aerosol, Inhalation) Active. Xopenex HFA (45MCG/ACT Aerosol, Inhalation) Active.  Social History Ventura Sellers, [place]; 09/17/2014 1:59 PM) Caffeine use Carbonated beverages. No alcohol use No drug use Tobacco use Never smoker.  Family History Ventura Sellers, [place]; 09/17/2014 1:59 PM) Arthritis Father, Mother, Sister. Heart disease in female family member before age 68 Hypertension Father, Mother. Respiratory Condition Father, Mother.  Pregnancy / Birth History Ventura Sellers, [place]; 09/17/2014 1:59 PM) Age at menarche 36 years. Age of menopause 25-50 Contraceptive History Intrauterine device. Gravida 3 Maternal age 62-20 Para 2     Review of Systems Sharyn Lull R. Brooks CMA;  09/17/2014 1:59 PM) General Not Present- Appetite Loss, Chills, Fatigue, Fever, Night Sweats, Weight Gain and Weight Loss. Skin Not Present- Change in Wart/Mole, Dryness, Hives, Jaundice, New Lesions, Non-Healing Wounds, Rash and Ulcer. HEENT Not Present- Earache, Hearing Loss, Hoarseness, Nose Bleed, Oral Ulcers, Ringing in the Ears, Seasonal Allergies, Sinus Pain, Sore Throat, Visual Disturbances, Wears glasses/contact lenses and Yellow Eyes. Respiratory Not Present- Bloody sputum, Chronic Cough, Difficulty Breathing,  Snoring and Wheezing. Breast Present- Breast Mass. Not Present- Breast Pain, Nipple Discharge and Skin Changes. Cardiovascular Not Present- Chest Pain, Difficulty Breathing Lying Down, Leg Cramps, Palpitations, Rapid Heart Rate, Shortness of Breath and Swelling of Extremities. Gastrointestinal Not Present- Abdominal Pain, Bloating, Bloody Stool, Change in Bowel Habits, Chronic diarrhea, Constipation, Difficulty Swallowing, Excessive gas, Gets full quickly at meals, Hemorrhoids, Indigestion, Nausea, Rectal Pain and Vomiting. Female Genitourinary Not Present- Frequency, Nocturia, Painful Urination, Pelvic Pain and Urgency. Musculoskeletal Present- Back Pain and Joint Stiffness. Not Present- Joint Pain, Muscle Pain, Muscle Weakness and Swelling of Extremities. Neurological Not Present- Decreased Memory, Fainting, Headaches, Numbness, Seizures, Tingling, Tremor, Trouble walking and Weakness. Psychiatric Not Present- Anxiety, Bipolar, Change in Sleep Pattern, Depression, Fearful and Frequent crying. Endocrine Not Present- Cold Intolerance, Excessive Hunger, Hair Changes, Heat Intolerance, Hot flashes and New Diabetes. Hematology Not Present- Easy Bruising, Excessive bleeding, Gland problems, HIV and Persistent Infections.  Vitals [employer] R. Brooks CMA; 09/17/2014 1:59 PM) 09/17/2014 1:59 PM Weight: 185.25 lb Height: 62in Body Surface Area: 1.92 m Body Mass Index: 33.88  kg/m Pulse: 76 (Regular)  Resp.: 16 (Unlabored)  BP: 120/84 (Sitting, Left Arm, Standard)     Physical Exam Daisy Hollingshead MD; 09/17/2014 6:23 PM)  The physical exam findings are as follows: Note:General: Overweight female in NAD. Pleasant and cooperative. Her husband is present.  EYES: EOMI, no icterus  NECK: Supple, no obvious mass.  CV: RRR, no murmur, no JVD.  CHEST: Breath sounds equal and clear. Respirations nonlabored. Port-a-cath in right upper chest wall. Left chest wall scar with no chest wall nodules.  RIGHT BREAST: 5 cm fullness in lateral breast that is tender with some ecchymosis and small healing wound.  ABDOMEN: Soft, nontender, nondistended, no masses  LYMPHATIC: No palpable cervical, supraclavicular, axillary adenopathy.  NEUROLOGIC: Alert and oriented, answers questions appropriately  PSYCHIATRIC: Normal mood, affect , and behavior.    Assessment & Plan Daisy Hollingshead MD; 09/17/2014 6:27 PM)  BREAST MASS, RIGHT [phone]  N63) Impression: Image guided biopsy of the upper mass was benign. It was felt that the lower mass may represent similar pathology. She has a personal history of left breast cancer. Removal of the mass would leave a significant cosmetic deformity. The mass is also painful.  Plan: I feel a right mastectomy is reasonable in this case. She would like to proceed with this. I have explained the procedure, risks, and aftercare to her. Risks include but are not limited to bleeding, infection, wound problems, anesthesia, chronic chest wall pain, seroma formation, need remove port-a-cath. She understands that the risks are higher because of her chronic immunosuppressed state.Vitamin A around the time of surgery has been recommended. She seems to understand all of this and wants to proceed. She will need a preoperative anesthesia consult.  Jackolyn Confer, M.D.

## 2014-09-19 ENCOUNTER — Telehealth: Payer: Self-pay | Admitting: Oncology

## 2014-09-19 NOTE — Telephone Encounter (Signed)
, °

## 2014-09-22 ENCOUNTER — Encounter (HOSPITAL_COMMUNITY): Payer: Self-pay | Admitting: Pharmacy Technician

## 2014-09-23 ENCOUNTER — Encounter (HOSPITAL_COMMUNITY)
Admission: RE | Admit: 2014-09-23 | Discharge: 2014-09-23 | Disposition: A | Discharge status: Home / Self Care | Payer: BC Managed Care – PPO | Source: Ambulatory Visit | Attending: General Surgery | Admitting: General Surgery

## 2014-09-23 ENCOUNTER — Encounter (HOSPITAL_COMMUNITY): Payer: Self-pay

## 2014-09-23 DIAGNOSIS — Z853 Personal history of malignant neoplasm of breast: Secondary | ICD-10-CM | POA: Diagnosis not present

## 2014-09-23 DIAGNOSIS — J42 Unspecified chronic bronchitis: Secondary | ICD-10-CM | POA: Diagnosis not present

## 2014-09-23 DIAGNOSIS — G35 Multiple sclerosis: Secondary | ICD-10-CM | POA: Diagnosis not present

## 2014-09-23 DIAGNOSIS — Z91041 Radiographic dye allergy status: Secondary | ICD-10-CM | POA: Diagnosis not present

## 2014-09-23 DIAGNOSIS — Z881 Allergy status to other antibiotic agents status: Secondary | ICD-10-CM | POA: Diagnosis not present

## 2014-09-23 DIAGNOSIS — E271 Primary adrenocortical insufficiency: Secondary | ICD-10-CM | POA: Diagnosis not present

## 2014-09-23 DIAGNOSIS — J45909 Unspecified asthma, uncomplicated: Secondary | ICD-10-CM | POA: Diagnosis not present

## 2014-09-23 DIAGNOSIS — K219 Gastro-esophageal reflux disease without esophagitis: Secondary | ICD-10-CM | POA: Diagnosis not present

## 2014-09-23 DIAGNOSIS — Z79899 Other long term (current) drug therapy: Secondary | ICD-10-CM | POA: Diagnosis not present

## 2014-09-23 DIAGNOSIS — N6011 Diffuse cystic mastopathy of right breast: Secondary | ICD-10-CM | POA: Diagnosis not present

## 2014-09-23 DIAGNOSIS — Z882 Allergy status to sulfonamides status: Secondary | ICD-10-CM | POA: Diagnosis not present

## 2014-09-23 DIAGNOSIS — Z803 Family history of malignant neoplasm of breast: Secondary | ICD-10-CM | POA: Diagnosis not present

## 2014-09-23 DIAGNOSIS — Z885 Allergy status to narcotic agent status: Secondary | ICD-10-CM | POA: Diagnosis not present

## 2014-09-23 DIAGNOSIS — E079 Disorder of thyroid, unspecified: Secondary | ICD-10-CM | POA: Diagnosis not present

## 2014-09-23 DIAGNOSIS — Z880 Allergy status to penicillin: Secondary | ICD-10-CM | POA: Diagnosis not present

## 2014-09-23 DIAGNOSIS — Z7952 Long term (current) use of systemic steroids: Secondary | ICD-10-CM | POA: Diagnosis not present

## 2014-09-23 DIAGNOSIS — N63 Unspecified lump in breast: Secondary | ICD-10-CM | POA: Diagnosis present

## 2014-09-23 DIAGNOSIS — Z888 Allergy status to other drugs, medicaments and biological substances status: Secondary | ICD-10-CM | POA: Diagnosis not present

## 2014-09-23 HISTORY — DX: Personal history of other specified conditions: Z87.898

## 2014-09-23 HISTORY — DX: Other specified disorders of veins: I87.8

## 2014-09-23 HISTORY — DX: Other chronic pain: G89.29

## 2014-09-23 HISTORY — DX: Other injury of unspecified body region, initial encounter: T14.8XXA

## 2014-09-23 HISTORY — DX: Repeated falls: R29.6

## 2014-09-23 HISTORY — DX: Personal history of other diseases of urinary system: Z87.448

## 2014-09-23 LAB — CBC WITH DIFFERENTIAL/PLATELET
Basophils Absolute: 0.1 K/uL (ref 0.0–0.1)
Basophils Relative: 1 % (ref 0–1)
Eosinophils Absolute: 0.2 K/uL (ref 0.0–0.7)
Eosinophils Relative: 2 % (ref 0–5)
HCT: 41.9 % (ref 36.0–46.0)
Hemoglobin: 13.6 g/dL (ref 12.0–15.0)
Lymphocytes Relative: 20 % (ref 12–46)
Lymphs Abs: 1.8 K/uL (ref 0.7–4.0)
MCH: 26.8 pg (ref 26.0–34.0)
MCHC: 32.5 g/dL (ref 30.0–36.0)
MCV: 82.5 fL (ref 78.0–100.0)
Monocytes Absolute: 0.6 K/uL (ref 0.1–1.0)
Monocytes Relative: 7 % (ref 3–12)
Neutro Abs: 6.6 K/uL (ref 1.7–7.7)
Neutrophils Relative %: 72 % (ref 43–77)
Platelets: 208 K/uL (ref 150–400)
RBC: 5.08 MIL/uL (ref 3.87–5.11)
RDW: 14.3 % (ref 11.5–15.5)
WBC: 9.2 K/uL (ref 4.0–10.5)

## 2014-09-23 LAB — COMPREHENSIVE METABOLIC PANEL WITH GFR
ALT: 17 U/L (ref 0–35)
AST: 22 U/L (ref 0–37)
Albumin: 4 g/dL (ref 3.5–5.2)
Alkaline Phosphatase: 71 U/L (ref 39–117)
Anion gap: 15 (ref 5–15)
BUN: 20 mg/dL (ref 6–23)
CO2: 25 meq/L (ref 19–32)
Calcium: 9.3 mg/dL (ref 8.4–10.5)
Chloride: 101 meq/L (ref 96–112)
Creatinine, Ser: 0.81 mg/dL (ref 0.50–1.10)
GFR calc Af Amer: 88 mL/min — ABNORMAL LOW
GFR calc non Af Amer: 76 mL/min — ABNORMAL LOW
Glucose, Bld: 107 mg/dL — ABNORMAL HIGH (ref 70–99)
Potassium: 4.9 meq/L (ref 3.7–5.3)
Sodium: 141 meq/L (ref 137–147)
Total Bilirubin: 0.3 mg/dL (ref 0.3–1.2)
Total Protein: 7.4 g/dL (ref 6.0–8.3)

## 2014-09-23 LAB — PROTIME-INR
INR: 1.05 (ref 0.00–1.49)
Prothrombin Time: 13.8 s (ref 11.6–15.2)

## 2014-09-23 LAB — ABO/RH: ABO/RH(D): O POS

## 2014-09-23 NOTE — Anesthesia Preprocedure Evaluation (Addendum)
Anesthesia Evaluation  Patient identified by MRN, date of birth, ID band Patient awake    Reviewed: Allergy & Precautions, H&P , NPO status , Patient's Chart, lab work & pertinent test results, reviewed documented beta blocker date and time   Airway Mallampati: III TM Distance: >3 FB Neck ROM: full    Dental no notable dental hx. (+) Teeth Intact, Dental Advisory Given   Pulmonary asthma , sleep apnea, Continuous Positive Airway Pressure Ventilation and Oxygen sleep apnea , COPD COPD inhaler,  Chronic bronchitis breath sounds clear to auscultation  Pulmonary exam normal       Cardiovascular Exercise Tolerance: Good hypertension, Pt. on home beta blockers Rhythm:regular Rate:Normal     Neuro/Psych  Headaches, Possible very mild MS  Neuromuscular disease (MS) negative psych ROS   GI/Hepatic negative GI ROS, hiatal hernia, GERD-  Medicated and Controlled,(+) Hepatitis -, B  Endo/Other  diabetes, Well Controlled, Type 2, Oral Hypoglycemic AgentsHypothyroidism Adrenal insufficiency with Addison's.  No meds for DM.  Renal/GU negative Renal ROS  negative genitourinary   Musculoskeletal  (+) Fibromyalgia -  Abdominal   Peds  Hematology negative hematology ROS (+)   Anesthesia Other Findings   Reproductive/Obstetrics negative OB ROS                          Anesthesia Physical  Anesthesia Plan  ASA: III  Anesthesia Plan: General   Post-op Pain Management:    Induction: Intravenous  Airway Management Planned: Oral ETT  Additional Equipment:   Intra-op Plan:   Post-operative Plan: Extubation in OR  Informed Consent: I have reviewed the patients History and Physical, chart, labs and discussed the procedure including the risks, benefits and alternatives for the proposed anesthesia with the patient or authorized representative who has indicated his/her understanding and acceptance.   Dental  Advisory Given  Plan Discussed with: CRNA and Surgeon  Anesthesia Plan Comments: (Difficult IV access. Port to be removed. Cant use. Left arm not useable because of previous axillary dissection. Right is operative side. Will look in Feet then consider a neck line if necessary. Pt understands. )       Anesthesia Quick Evaluation                                  Anesthesia Evaluation  Patient identified by MRN, date of birth, ID band Patient awake    Reviewed: Allergy & Precautions, H&P , NPO status , Patient's Chart, lab work & pertinent test results, reviewed documented beta blocker date and time   Airway Mallampati: III TM Distance: >3 FB Neck ROM: full    Dental no notable dental hx. (+) Teeth Intact, Dental Advisory Given   Pulmonary asthma , sleep apnea, Continuous Positive Airway Pressure Ventilation and Oxygen sleep apnea , COPD COPD inhaler,  Chronic bronchitis breath sounds clear to auscultation  Pulmonary exam normal       Cardiovascular Exercise Tolerance: Good hypertension, Pt. on home beta blockers negative cardio ROS  Rhythm:regular Rate:Normal     Neuro/Psych  Headaches, negative neurological ROS  negative psych ROS   GI/Hepatic negative GI ROS, hiatal hernia, GERD-  Medicated and Controlled,(+) Hepatitis -, B  Endo/Other  diabetes, Well Controlled, Type 2, Oral Hypoglycemic AgentsHypothyroidism Adrenal insufficiency with Addison's.  No meds for DM.  Renal/GU negative Renal ROS  negative genitourinary   Musculoskeletal  (+) Fibromyalgia -  Abdominal  Peds  Hematology negative hematology ROS (+)   Anesthesia Other Findings   Reproductive/Obstetrics negative OB ROS                           Anesthesia Physical  Anesthesia Plan  ASA: III  Anesthesia Plan: MAC   Post-op Pain Management:    Induction:   Airway Management Planned: Natural Airway  Additional Equipment:   Intra-op Plan:    Post-operative Plan:   Informed Consent: I have reviewed the patients History and Physical, chart, labs and discussed the procedure including the risks, benefits and alternatives for the proposed anesthesia with the patient or authorized representative who has indicated his/her understanding and acceptance.   Dental Advisory Given  Plan Discussed with: CRNA and Surgeon  Anesthesia Plan Comments:         Anesthesia Quick Evaluation

## 2014-09-23 NOTE — Patient Instructions (Signed)
YOUR SURGERY IS SCHEDULED AT [facility]  ON:   Friday  10/23  REPORT TO  SHORT STAY CENTER AT:   9:00 AM   DO NOT EAT OR DRINK ANYTHING AFTER MIDNIGHT THE NIGHT BEFORE YOUR SURGERY.  YOU MAY BRUSH YOUR TEETH, RINSE OUT YOUR MOUTH--BUT NO WATER, NO FOOD, NO CHEWING GUM, NO MINTS, NO CANDIES, NO CHEWING TOBACCO.   PLEASE TAKE THE FOLLOWING MEDICATIONS THE AM OF YOUR SURGERY WITH A FEW SIPS OF WATER: CLONAZEPAM, HYDROXYZINE, SYNTHROID, METOPROLOL, MORPHINE, OMEPRAZOLE, ONDANSETRON IF NEEDED FOR NAUSEA, PREDNISONE.  USE YOUR SYMBICORT AND LEVALBUTEROL INHALERS.    DO NOT BRING VALUABLES, MONEY, CREDIT CARDS.  DO NOT WEAR JEWELRY, MAKE-UP, NAIL POLISH AND NO METAL PINS OR CLIPS IN YOUR HAIR. CONTACT LENS, DENTURES / PARTIALS, GLASSES SHOULD NOT BE WORN TO SURGERY AND IN MOST CASES-HEARING AIDS WILL NEED TO BE REMOVED.  BRING YOUR GLASSES CASE, ANY EQUIPMENT NEEDED FOR YOUR CONTACT LENS. FOR PATIENTS ADMITTED TO THE HOSPITAL--CHECK OUT TIME THE DAY OF DISCHARGE IS 11:00 AM.  ALL INPATIENT ROOMS ARE PRIVATE - WITH BATHROOM, TELEPHONE, TELEVISION AND WIFI INTERNET.    PLEASE BE AWARE THAT YOU MAY NEED ADDITIONAL BLOOD DRAWN DAY OF YOUR SURGERY  _______________________________________________________________________   [facility] - Preparing for Surgery Before surgery, you can play an important role.  Because skin is not sterile, your skin needs to be as free of germs as possible.  You can reduce the number of germs on your skin by washing with CHG (chlorahexidine gluconate) soap before surgery.  CHG is an antiseptic cleaner which kills germs and bonds with the skin to continue killing germs even after washing. Please DO NOT use if you have an allergy to CHG or antibacterial soaps.  If your skin becomes reddened/irritated stop using the CHG and inform your nurse when you arrive at Short Stay. Do not shave (including legs and underarms) for at least 48 hours prior to the first  CHG shower.  You may shave your face/neck. Please follow these instructions carefully:  1.  Shower with CHG Soap the night before surgery and the  morning of Surgery.  2.  If you choose to wash your hair, wash your hair first as usual with your  normal  shampoo.  3.  After you shampoo, rinse your hair and body thoroughly to remove the  shampoo.                           4.  Use CHG as you would any other liquid soap.  You can apply chg directly  to the skin and wash                       Gently with a scrungie or clean washcloth.  5.  Apply the CHG Soap to your body ONLY FROM THE NECK DOWN.   Do not use on face/ open                           Wound or open sores. Avoid contact with eyes, ears mouth and genitals (private parts).                       Wash face,  Genitals (private parts) with your normal soap.             6.  Wash thoroughly, paying special attention to  the area where your surgery  will be performed.  7.  Thoroughly rinse your body with warm water from the neck down.  8.  DO NOT shower/wash with your normal soap after using and rinsing off  the CHG Soap.                9.  Pat yourself dry with a clean towel.            10.  Wear clean pajamas.            11.  Place clean sheets on your bed the night of your first shower and do not  sleep with pets. Day of Surgery : Do not apply any lotions/deodorants the morning of surgery.  Please wear clean clothes to the hospital/surgery center.  FAILURE TO FOLLOW THESE INSTRUCTIONS MAY RESULT IN THE CANCELLATION OF YOUR SURGERY PATIENT SIGNATURE_________________________________  NURSE SIGNATURE__________________________________  ________________________________________________________________________

## 2014-09-23 NOTE — Pre-Procedure Instructions (Signed)
EKG AND CXR REPORTS IN EPIC FROM 10-21-13. ANESTHESIOLOGY CONSULT TODAY BY DR. CARIGNAN.

## 2014-09-25 ENCOUNTER — Ambulatory Visit (HOSPITAL_COMMUNITY)
Admission: RE | Admit: 2014-09-25 | Discharge: 2014-09-26 | Disposition: A | Discharge status: Home / Self Care | Payer: BC Managed Care – PPO | Source: Ambulatory Visit | Attending: General Surgery | Admitting: General Surgery

## 2014-09-25 ENCOUNTER — Encounter (HOSPITAL_COMMUNITY): Payer: BC Managed Care – PPO | Admitting: Anesthesiology

## 2014-09-25 ENCOUNTER — Encounter (HOSPITAL_COMMUNITY): Payer: Self-pay | Admitting: *Deleted

## 2014-09-25 ENCOUNTER — Encounter (HOSPITAL_COMMUNITY)
Admission: RE | Disposition: A | Discharge status: Home / Self Care | Payer: Self-pay | Source: Ambulatory Visit | Attending: General Surgery

## 2014-09-25 ENCOUNTER — Ambulatory Visit (HOSPITAL_COMMUNITY): Payer: BC Managed Care – PPO | Admitting: *Deleted

## 2014-09-25 ENCOUNTER — Other Ambulatory Visit (INDEPENDENT_AMBULATORY_CARE_PROVIDER_SITE_OTHER): Payer: Self-pay | Admitting: General Surgery

## 2014-09-25 DIAGNOSIS — Z7952 Long term (current) use of systemic steroids: Secondary | ICD-10-CM | POA: Insufficient documentation

## 2014-09-25 DIAGNOSIS — Z882 Allergy status to sulfonamides status: Secondary | ICD-10-CM | POA: Insufficient documentation

## 2014-09-25 DIAGNOSIS — Z853 Personal history of malignant neoplasm of breast: Secondary | ICD-10-CM

## 2014-09-25 DIAGNOSIS — J45909 Unspecified asthma, uncomplicated: Secondary | ICD-10-CM | POA: Insufficient documentation

## 2014-09-25 DIAGNOSIS — Z880 Allergy status to penicillin: Secondary | ICD-10-CM | POA: Insufficient documentation

## 2014-09-25 DIAGNOSIS — Z888 Allergy status to other drugs, medicaments and biological substances status: Secondary | ICD-10-CM | POA: Insufficient documentation

## 2014-09-25 DIAGNOSIS — N6011 Diffuse cystic mastopathy of right breast: Secondary | ICD-10-CM | POA: Diagnosis not present

## 2014-09-25 DIAGNOSIS — Z803 Family history of malignant neoplasm of breast: Secondary | ICD-10-CM | POA: Insufficient documentation

## 2014-09-25 DIAGNOSIS — Z885 Allergy status to narcotic agent status: Secondary | ICD-10-CM | POA: Insufficient documentation

## 2014-09-25 DIAGNOSIS — E271 Primary adrenocortical insufficiency: Secondary | ICD-10-CM | POA: Insufficient documentation

## 2014-09-25 DIAGNOSIS — Z881 Allergy status to other antibiotic agents status: Secondary | ICD-10-CM | POA: Insufficient documentation

## 2014-09-25 DIAGNOSIS — K219 Gastro-esophageal reflux disease without esophagitis: Secondary | ICD-10-CM | POA: Insufficient documentation

## 2014-09-25 DIAGNOSIS — E079 Disorder of thyroid, unspecified: Secondary | ICD-10-CM | POA: Insufficient documentation

## 2014-09-25 DIAGNOSIS — G35 Multiple sclerosis: Secondary | ICD-10-CM | POA: Insufficient documentation

## 2014-09-25 DIAGNOSIS — Z91041 Radiographic dye allergy status: Secondary | ICD-10-CM | POA: Insufficient documentation

## 2014-09-25 DIAGNOSIS — Z79899 Other long term (current) drug therapy: Secondary | ICD-10-CM | POA: Insufficient documentation

## 2014-09-25 DIAGNOSIS — J42 Unspecified chronic bronchitis: Secondary | ICD-10-CM | POA: Insufficient documentation

## 2014-09-25 HISTORY — PX: TOTAL MASTECTOMY: SHX6129

## 2014-09-25 LAB — TYPE AND SCREEN
ABO/RH(D): O POS
Antibody Screen: NEGATIVE

## 2014-09-25 LAB — GLUCOSE, CAPILLARY: Glucose-Capillary: 112 mg/dL — ABNORMAL HIGH (ref 70–99)

## 2014-09-25 SURGERY — MASTECTOMY, SIMPLE
Anesthesia: General | Site: Breast | Laterality: Right

## 2014-09-25 MED ORDER — GLYCOPYRROLATE 0.2 MG/ML IJ SOLN
INTRAMUSCULAR | Status: DC | PRN
  Administered 2014-09-25: 0.4 mg via INTRAVENOUS

## 2014-09-25 MED ORDER — NYSTATIN 100000 UNIT/GM EX POWD
Freq: Two times a day (BID) | CUTANEOUS | Status: DC
  Administered 2014-09-25 – 2014-09-26 (×3): via TOPICAL
  Filled 2014-09-25: qty 15

## 2014-09-25 MED ORDER — SODIUM CHLORIDE 0.9 % IJ SOLN
10.0000 mL | INTRAMUSCULAR | Status: DC | PRN
  Administered 2014-09-26: 10 mL

## 2014-09-25 MED ORDER — LIDOCAINE HCL (CARDIAC) 20 MG/ML IV SOLN
INTRAVENOUS | Status: AC
  Filled 2014-09-25: qty 5

## 2014-09-25 MED ORDER — FENTANYL CITRATE 0.05 MG/ML IJ SOLN
INTRAMUSCULAR | Status: AC
  Filled 2014-09-25: qty 2

## 2014-09-25 MED ORDER — DEXAMETHASONE SODIUM PHOSPHATE 10 MG/ML IJ SOLN
INTRAMUSCULAR | Status: DC | PRN
  Administered 2014-09-25: 10 mg via INTRAVENOUS

## 2014-09-25 MED ORDER — FENTANYL CITRATE 0.05 MG/ML IJ SOLN
INTRAMUSCULAR | Status: AC
  Filled 2014-09-25: qty 5

## 2014-09-25 MED ORDER — SENNA 8.6 MG PO TABS: 1.0000 | ORAL_TABLET | Freq: Every day | ORAL | Status: DC | PRN

## 2014-09-25 MED ORDER — PREDNISONE 5 MG PO TABS
5.0000 mg | ORAL_TABLET | Freq: Every day | ORAL | Status: DC
  Administered 2014-09-26: 5 mg via ORAL
  Filled 2014-09-25 (×2): qty 1

## 2014-09-25 MED ORDER — LEVALBUTEROL HCL 1.25 MG/0.5ML IN NEBU
1.2500 mg | INHALATION_SOLUTION | RESPIRATORY_TRACT | Status: DC | PRN
  Filled 2014-09-25: qty 0.5

## 2014-09-25 MED ORDER — GLYCOPYRROLATE 0.2 MG/ML IJ SOLN
INTRAMUSCULAR | Status: AC
  Filled 2014-09-25: qty 3

## 2014-09-25 MED ORDER — MIDAZOLAM HCL 5 MG/5ML IJ SOLN
INTRAMUSCULAR | Status: DC | PRN
  Administered 2014-09-25 (×2): 1 mg via INTRAVENOUS

## 2014-09-25 MED ORDER — SODIUM CHLORIDE 0.9 % IJ SOLN
10.0000 mL | Freq: Two times a day (BID) | INTRAMUSCULAR | Status: DC
  Administered 2014-09-25 (×2): 10 mL

## 2014-09-25 MED ORDER — BUDESONIDE-FORMOTEROL FUMARATE 160-4.5 MCG/ACT IN AERO
2.0000 | INHALATION_SPRAY | Freq: Two times a day (BID) | RESPIRATORY_TRACT | Status: DC | PRN
  Filled 2014-09-25: qty 6

## 2014-09-25 MED ORDER — FENTANYL CITRATE 0.05 MG/ML IJ SOLN
INTRAMUSCULAR | Status: DC | PRN
  Administered 2014-09-25 (×5): 50 ug via INTRAVENOUS

## 2014-09-25 MED ORDER — NEOSTIGMINE METHYLSULFATE 10 MG/10ML IV SOLN
INTRAVENOUS | Status: DC | PRN
  Administered 2014-09-25: 3 mg via INTRAVENOUS

## 2014-09-25 MED ORDER — ROCURONIUM BROMIDE 100 MG/10ML IV SOLN
INTRAVENOUS | Status: AC
  Filled 2014-09-25: qty 1

## 2014-09-25 MED ORDER — METOPROLOL TARTRATE 12.5 MG HALF TABLET
12.5000 mg | ORAL_TABLET | Freq: Two times a day (BID) | ORAL | Status: DC
  Administered 2014-09-25 – 2014-09-26 (×2): 12.5 mg via ORAL
  Filled 2014-09-25 (×3): qty 1

## 2014-09-25 MED ORDER — ONDANSETRON HCL 4 MG/2ML IJ SOLN
INTRAMUSCULAR | Status: DC | PRN
  Administered 2014-09-25: 4 mg via INTRAVENOUS

## 2014-09-25 MED ORDER — LACTATED RINGERS IV SOLN
INTRAVENOUS | Status: DC | PRN
  Administered 2014-09-25 (×2): via INTRAVENOUS

## 2014-09-25 MED ORDER — KCL IN DEXTROSE-NACL 20-5-0.45 MEQ/L-%-% IV SOLN
INTRAVENOUS | Status: DC
  Administered 2014-09-25: 1000 mL via INTRAVENOUS
  Administered 2014-09-26: 06:00:00 via INTRAVENOUS
  Filled 2014-09-25 (×3): qty 1000

## 2014-09-25 MED ORDER — LIDOCAINE HCL (CARDIAC) 20 MG/ML IV SOLN
INTRAVENOUS | Status: DC | PRN
  Administered 2014-09-25: 50 mg via INTRAVENOUS

## 2014-09-25 MED ORDER — OXYCODONE HCL 5 MG PO TABS: 5.0000 mg | ORAL_TABLET | ORAL | Status: DC | PRN

## 2014-09-25 MED ORDER — LACTATED RINGERS IV SOLN: INTRAVENOUS | Status: DC

## 2014-09-25 MED ORDER — EPHEDRINE SULFATE 50 MG/ML IJ SOLN
INTRAMUSCULAR | Status: DC | PRN
  Administered 2014-09-25: 5 mg via INTRAVENOUS

## 2014-09-25 MED ORDER — FENTANYL CITRATE 0.05 MG/ML IJ SOLN
INTRAMUSCULAR | Status: AC
Start: 2014-09-25 — End: 2014-09-25
  Filled 2014-09-25: qty 5

## 2014-09-25 MED ORDER — PROPOFOL 10 MG/ML IV BOLUS
INTRAVENOUS | Status: AC
  Filled 2014-09-25: qty 20

## 2014-09-25 MED ORDER — ONDANSETRON HCL 4 MG PO TABS: 4.0000 mg | ORAL_TABLET | Freq: Four times a day (QID) | ORAL | Status: DC | PRN

## 2014-09-25 MED ORDER — CEFAZOLIN SODIUM 1-5 GM-% IV SOLN
1.0000 g | Freq: Three times a day (TID) | INTRAVENOUS | Status: AC
  Administered 2014-09-25: 1 g via INTRAVENOUS
  Filled 2014-09-25: qty 50

## 2014-09-25 MED ORDER — ONDANSETRON HCL 4 MG/2ML IJ SOLN
INTRAMUSCULAR | Status: AC
  Filled 2014-09-25: qty 2

## 2014-09-25 MED ORDER — FENTANYL CITRATE 0.05 MG/ML IJ SOLN
25.0000 ug | INTRAMUSCULAR | Status: DC | PRN
  Administered 2014-09-25: 50 ug via INTRAVENOUS

## 2014-09-25 MED ORDER — FENTANYL CITRATE 0.05 MG/ML IJ SOLN
25.0000 ug | INTRAMUSCULAR | Status: DC | PRN
  Administered 2014-09-25 (×4): 50 ug via INTRAVENOUS

## 2014-09-25 MED ORDER — MORPHINE SULFATE 10 MG/ML IJ SOLN
INTRAMUSCULAR | Status: AC
  Filled 2014-09-25: qty 1

## 2014-09-25 MED ORDER — MIDAZOLAM HCL 2 MG/2ML IJ SOLN
INTRAMUSCULAR | Status: AC
  Filled 2014-09-25: qty 2

## 2014-09-25 MED ORDER — CLONAZEPAM 1 MG PO TABS
1.0000 mg | ORAL_TABLET | Freq: Three times a day (TID) | ORAL | Status: DC
  Administered 2014-09-25 – 2014-09-26 (×2): 1 mg via ORAL
  Filled 2014-09-25 (×2): qty 1

## 2014-09-25 MED ORDER — METHOCARBAMOL 500 MG PO TABS
500.0000 mg | ORAL_TABLET | Freq: Four times a day (QID) | ORAL | Status: DC
Start: 2014-09-25 — End: 2014-09-26
  Administered 2014-09-25 – 2014-09-26 (×3): 500 mg via ORAL
  Filled 2014-09-25 (×8): qty 1

## 2014-09-25 MED ORDER — ROCURONIUM BROMIDE 100 MG/10ML IV SOLN
INTRAVENOUS | Status: DC | PRN
  Administered 2014-09-25: 35 mg via INTRAVENOUS

## 2014-09-25 MED ORDER — MORPHINE SULFATE 10 MG/ML IJ SOLN
2.0000 mg | INTRAMUSCULAR | Status: DC | PRN
  Administered 2014-09-25: 4 mg via INTRAVENOUS
  Administered 2014-09-25: 3 mg via INTRAVENOUS
  Administered 2014-09-26: 4 mg via INTRAVENOUS
  Filled 2014-09-25 (×2): qty 1

## 2014-09-25 MED ORDER — NYSTATIN 100000 UNIT/GM EX POWD: 1.0000 g | Freq: Two times a day (BID) | CUTANEOUS | Status: DC

## 2014-09-25 MED ORDER — OXYCODONE HCL 5 MG PO TABS
5.0000 mg | ORAL_TABLET | ORAL | Status: DC | PRN
  Administered 2014-09-25: 10 mg via ORAL
  Filled 2014-09-25: qty 2

## 2014-09-25 MED ORDER — ONDANSETRON HCL 4 MG/2ML IJ SOLN: 4.0000 mg | INTRAMUSCULAR | Status: DC | PRN

## 2014-09-25 MED ORDER — CEFAZOLIN SODIUM-DEXTROSE 2-3 GM-% IV SOLR
2.0000 g | INTRAVENOUS | Status: AC
  Administered 2014-09-25: 2 g via INTRAVENOUS

## 2014-09-25 MED ORDER — HYDROXYZINE HCL 50 MG PO TABS
50.0000 mg | ORAL_TABLET | Freq: Four times a day (QID) | ORAL | Status: DC | PRN
  Filled 2014-09-25: qty 2

## 2014-09-25 MED ORDER — LEVOTHYROXINE SODIUM 75 MCG PO TABS
75.0000 ug | ORAL_TABLET | Freq: Every day | ORAL | Status: DC
  Administered 2014-09-25 – 2014-09-26 (×2): 75 ug via ORAL
  Filled 2014-09-25 (×3): qty 1

## 2014-09-25 MED ORDER — PANTOPRAZOLE SODIUM 40 MG PO TBEC
40.0000 mg | DELAYED_RELEASE_TABLET | Freq: Every day | ORAL | Status: DC
  Administered 2014-09-26: 40 mg via ORAL
  Filled 2014-09-25 (×2): qty 1

## 2014-09-25 MED ORDER — ALBUTEROL SULFATE (2.5 MG/3ML) 0.083% IN NEBU: 3.0000 mL | INHALATION_SOLUTION | RESPIRATORY_TRACT | Status: DC | PRN

## 2014-09-25 MED ORDER — MORPHINE SULFATE 15 MG PO TABS
30.0000 mg | ORAL_TABLET | Freq: Two times a day (BID) | ORAL | Status: DC
  Administered 2014-09-25 – 2014-09-26 (×2): 30 mg via ORAL
  Filled 2014-09-25 (×2): qty 2

## 2014-09-25 MED ORDER — CEFAZOLIN SODIUM-DEXTROSE 2-3 GM-% IV SOLR
INTRAVENOUS | Status: AC
  Filled 2014-09-25: qty 50

## 2014-09-25 MED ORDER — PROPOFOL 10 MG/ML IV BOLUS
INTRAVENOUS | Status: DC | PRN
  Administered 2014-09-25: 180 mg via INTRAVENOUS

## 2014-09-25 MED ORDER — HYDROXYZINE HCL 50 MG/ML IM SOLN
50.0000 mg | Freq: Once | INTRAMUSCULAR | Status: AC
  Administered 2014-09-25: 50 mg via INTRAMUSCULAR
  Filled 2014-09-25: qty 1

## 2014-09-25 SURGICAL SUPPLY — 37 items
APL SKNCLS STERI-STRIP NONHPOA (GAUZE/BANDAGES/DRESSINGS) ×1
APPLIER CLIP 9.375 MED OPEN (MISCELLANEOUS) ×3
APR CLP MED 9.3 20 MLT OPN (MISCELLANEOUS) ×1
BANDAGE ELASTIC 6 VELCRO ST LF (GAUZE/BANDAGES/DRESSINGS) ×3 IMPLANT
BENZOIN TINCTURE PRP APPL 2/3 (GAUZE/BANDAGES/DRESSINGS) ×3 IMPLANT
BINDER BREAST LRG (GAUZE/BANDAGES/DRESSINGS) IMPLANT
BINDER BREAST XLRG (GAUZE/BANDAGES/DRESSINGS) ×2 IMPLANT
BIOPATCH WHT 1IN DISK W/4.0 H (GAUZE/BANDAGES/DRESSINGS) ×2 IMPLANT
CHLORAPREP W/TINT 26ML (MISCELLANEOUS) ×3 IMPLANT
CLIP APPLIE 9.375 MED OPEN (MISCELLANEOUS) ×1 IMPLANT
CLOSURE WOUND 1/2 X4 (GAUZE/BANDAGES/DRESSINGS) ×1
COVER SURGICAL LIGHT HANDLE (MISCELLANEOUS) ×1 IMPLANT
DRAIN CHANNEL 19F RND (DRAIN) ×3 IMPLANT
DRAPE LAPAROSCOPIC ABDOMINAL (DRAPES) ×3 IMPLANT
DRAPE UTILITY 15X26 (DRAPE) ×12 IMPLANT
DRSG ADAPTIC 3X8 NADH LF (GAUZE/BANDAGES/DRESSINGS) ×2 IMPLANT
DRSG TEGADERM 8X12 (GAUZE/BANDAGES/DRESSINGS) ×2 IMPLANT
ELECT REM PT RETURN 9FT ADLT (ELECTROSURGICAL) ×3
ELECTRODE REM PT RTRN 9FT ADLT (ELECTROSURGICAL) ×1 IMPLANT
EVACUATOR SILICONE 100CC (DRAIN) ×3 IMPLANT
GAUZE SPONGE 4X4 12PLY STRL (GAUZE/BANDAGES/DRESSINGS) ×3 IMPLANT
GLOVE BIO SURGEON STRL SZ7.5 (GLOVE) ×7 IMPLANT
GLOVE BIOGEL PI IND STRL 8 (GLOVE) ×1 IMPLANT
GLOVE BIOGEL PI INDICATOR 8 (GLOVE) ×4
GLOVE ECLIPSE 8.0 STRL XLNG CF (GLOVE) ×5 IMPLANT
GOWN STRL REUS W/TWL LRG LVL3 (GOWN DISPOSABLE) ×10 IMPLANT
HOVERMATT SINGLE USE (MISCELLANEOUS) ×2 IMPLANT
KIT BASIN OR (CUSTOM PROCEDURE TRAY) ×3 IMPLANT
NS IRRIG 1000ML POUR BTL (IV SOLUTION) ×3 IMPLANT
PACK GENERAL/GYN (CUSTOM PROCEDURE TRAY) ×3 IMPLANT
STAPLER VISISTAT 35W (STAPLE) ×3 IMPLANT
STRIP CLOSURE SKIN 1/2X4 (GAUZE/BANDAGES/DRESSINGS) ×2 IMPLANT
SUT ETHILON 3 0 PS 1 (SUTURE) ×3 IMPLANT
SUT VIC AB 2-0 SH 18 (SUTURE) ×2 IMPLANT
SUT VIC AB 3-0 SH 18 (SUTURE) ×4 IMPLANT
TOWEL OR 17X26 10 PK STRL BLUE (TOWEL DISPOSABLE) ×5 IMPLANT
WATER STERILE IRR 1000ML POUR (IV SOLUTION) IMPLANT

## 2014-09-25 NOTE — Anesthesia Postprocedure Evaluation (Signed)
  Anesthesia Post-op Note  Patient: Daisy [name]  Procedure(s) Performed: Procedure(s) (LRB): RIGHT MASTECTOMY (Right)  Patient Location: PACU  Anesthesia Type: General  Level of Consciousness: awake and alert   Airway and Oxygen Therapy: Patient Spontanous Breathing  Post-op Pain: mild  Post-op Assessment: Post-op Vital signs reviewed, Patient's Cardiovascular Status Stable, Respiratory Function Stable, Patent Airway and No signs of Nausea or vomiting  Last Vitals:  Filed Vitals:   09/25/14 1315  BP: 130/68  Pulse: 69  Temp:   Resp: 14    Post-op Vital Signs: stable   Complications: No apparent anesthesia complications

## 2014-09-25 NOTE — Op Note (Signed)
Operative Note  Daisy [name] female 63 y.o. 09/25/2014  PREOPERATIVE DX:  Large right breast mass (fibrocystic change on biopsy) and personal history of left breast cancer  POSTOPERATIVE DX:  Same  PROCEDURE:   Right mastectomy         Surgeon: Odis Hollingshead   Assistants: Autumn Messing M.D.  Anesthesia: General endotracheal anesthesia  Indications:   This is a 63 year old female who has a history of left breast cancer and has undergone a left mastectomy in the past. She has a fairly large irregular mass in the right breast that has been biopsied and positive for fibrocystic disease. This is causing her pain. She is somewhat unsettled psychologically because of her personal history of left breast cancer. She now presents for the above procedure.    Procedure Detail:  She was seen in the holding area and the right breast mark with my initials. She was then brought to the operating room placed supine on the operating table a general anesthetic was given. The right breast and upper arm were sterilely prepped and draped.  An elliptical incision was made through the skin and dermis of the right breast to include the nipple areolar complex. Subcutaneous flaps were raised medially to the lateral border of the sternum, inferiorly to the anterior rectus sheath, superiorly to the clavicle, medially to the latissimus muscle. She had a Port-A-Cath in the superior right chest wall. This was left with the skin and subcutaneous flap and dissected free from the pectoralis fascia. Subsequently, the breast tissue was dissected free from the pectoralis fashion. The medial aspect of the breast was marked. It was then sent to pathology.  The wound was irrigated and bleeding was controlled with electrocautery. Once hemostasis was adequate, the Port-A-Cath was reimplanted onto the chest wall with interrupted 2-0 Vicryl sutures. A stab incision was made in the inferior flap. A 19 Blake drain was then placed  into the wound and anchored to the skin with a nylon suture. The subcutaneous tissue was then approximated with interrupted 3-0 Vicryl sutures. The skin was closed with staples. A sterile dressing was applied. The drain was hooked up to bulb suction.  She tolerated the procedure well without any apparent complications and was taken to the recovery room in satisfactory condition.   Estimated Blood Loss:  200 mL         Drains: #19 Blake drain          Specimens: right breast        Complications:  * No complications entered in OR log *         Disposition: PACU - hemodynamically stable.         Condition: stable

## 2014-09-25 NOTE — Plan of Care (Signed)
Problem: Phase I Progression Outcomes Goal: OOB as tolerated unless otherwise ordered Outcome: Progressing Patient needs one assist with transfers and with toileting.

## 2014-09-25 NOTE — H&P (View-Only) (Signed)
Patient ID: Daisy [name], female   DOB: Nov 22, 1951, 63 y.o.   MRN: [id number]  Carmyn Hamm 09/17/2014 1:58 PM Location: [facility] Patient #: [id number] DOB: 02-Sep-1951 Married / Language: Cleophus Molt / Race: White Female History of Present Illness Odis Hollingshead MD; 09/17/2014 6:09 PM) Patient words: evaluation of right breast.  The patient is a 63 year old female    Note:She presents today to discuss a right mastectomy. She has a personal history of left breast cancer. She has been having some right breast pain. She also notices a fullness in the lateral aspect of the right breast. Mammogram and ultrasound demonstrated a regular masses in the upper outer quadrant and lower outer quadrant of the right breast. Biopsy of the upper outer quadrant mass demonstrated benign fibrocystic changes. The mass persists. Because of the mass, her personal history of breast cancer, and the pain, she would like to have an elective prophylactic mastectomy. She states her Addison's disease is well controlled.  Other Problems Odis Hollingshead, MD; 09/17/2014 6:09 PM) Asthma Back Pain Breast Cancer Gastroesophageal Reflux Disease Kidney Stone Lump In Breast Oophorectomy Bilateral. Thyroid Disease ADDISONS DISEASE (255.41  E27.1)  Past Surgical History Ventura Sellers, [place]; 09/17/2014 1:59 PM) Appendectomy Breast Biopsy Bilateral. multiple Breast Mass; Local Excision Left. Gallbladder Surgery - Laparoscopic Hysterectomy (not due to cancer) - Complete Mastectomy Left. Oral Surgery Spinal Surgery - Neck Tonsillectomy  Diagnostic Studies History Ventura Sellers, [place]; 09/17/2014 1:59 PM) Colonoscopy 5-10 years ago Mammogram within last year  Allergies Ventura Sellers, CMA; 09/17/2014 2:09 PM) Leroy Sea *DERMATOLOGICALS* sick Mucinex *COUGH/COLD/ALLERGY* shaky Ambien *HYPNOTICS/SEDATIVES/SLEEP DISORDER AGENTS* shacky Amoxicillin  *PENICILLINS* Diarrhea. Biaxin *MACROLIDES* MetFORMIN HCl *CHEMICALS* migraines Compazine *ANTIPSYCHOTICS/ANTIMANIC AGENTS* anxious, shaky Vancomycin HCl *ANTI-INFECTIVE AGENTS - MISC.* Swelling. Dilaudid *ANALGESICS - OPIOID* Vomiting. Tussin *COUGH/COLD/ALLERGY* head ache Augmentin *PENICILLINS* bad head ache Sulfa 10 *OPHTHALMIC AGENTS* hallucinations Phenergan *ANTIHISTAMINES* anxiety Lisinopril *ANTIHYPERTENSIVES* Cough. LamISIL *ANTIFUNGALS* Anaphylaxis. Reglan *GASTROINTESTINAL AGENTS - MISC.* hyperactivity Iodinated Contrast Media Anaphylaxis.  Medication History Ventura Sellers, [place]; 09/17/2014 2:13 PM) Losartan Potassium (50MG  Tablet, Oral) Active. Metoprolol Tartrate (25MG  Tablet, Oral) Active. ClonazePAM (1MG  Tablet, Oral) Active. HydrOXYzine HCl (50MG  Tablet, Oral) Active. Morphine Sulfate (30MG  Tablet, Oral) Active. Synthroid (75MCG Tablet, Oral) Active. Omeprazole (40MG  Capsule DR, Oral) Active. PredniSONE (5MG  Tablet, Oral) Active. Robaxin (500MG  Tablet, Oral) Active. Vitamin B Complex (Oral) Active. Senokot (8.6MG  Tablet, Oral) Active. Viactiv (500-500-40MG -UNT-MCG Tablet Chewable, Oral) Active. Symbicort (160-4.5MCG/ACT Aerosol, Inhalation) Active. Xopenex HFA (45MCG/ACT Aerosol, Inhalation) Active.  Social History Ventura Sellers, [place]; 09/17/2014 1:59 PM) Caffeine use Carbonated beverages. No alcohol use No drug use Tobacco use Never smoker.  Family History Ventura Sellers, [place]; 09/17/2014 1:59 PM) Arthritis Father, Mother, Sister. Heart disease in female family member before age 64 Hypertension Father, Mother. Respiratory Condition Father, Mother.  Pregnancy / Birth History Ventura Sellers, [place]; 09/17/2014 1:59 PM) Age at menarche 55 years. Age of menopause 1-50 Contraceptive History Intrauterine device. Gravida 3 Maternal age 51-20 Para 2     Review of Systems Sharyn Lull R. Brooks CMA;  09/17/2014 1:59 PM) General Not Present- Appetite Loss, Chills, Fatigue, Fever, Night Sweats, Weight Gain and Weight Loss. Skin Not Present- Change in Wart/Mole, Dryness, Hives, Jaundice, New Lesions, Non-Healing Wounds, Rash and Ulcer. HEENT Not Present- Earache, Hearing Loss, Hoarseness, Nose Bleed, Oral Ulcers, Ringing in the Ears, Seasonal Allergies, Sinus Pain, Sore Throat, Visual Disturbances, Wears glasses/contact lenses and Yellow Eyes. Respiratory Not Present- Bloody sputum, Chronic Cough, Difficulty Breathing,  Snoring and Wheezing. Breast Present- Breast Mass. Not Present- Breast Pain, Nipple Discharge and Skin Changes. Cardiovascular Not Present- Chest Pain, Difficulty Breathing Lying Down, Leg Cramps, Palpitations, Rapid Heart Rate, Shortness of Breath and Swelling of Extremities. Gastrointestinal Not Present- Abdominal Pain, Bloating, Bloody Stool, Change in Bowel Habits, Chronic diarrhea, Constipation, Difficulty Swallowing, Excessive gas, Gets full quickly at meals, Hemorrhoids, Indigestion, Nausea, Rectal Pain and Vomiting. Female Genitourinary Not Present- Frequency, Nocturia, Painful Urination, Pelvic Pain and Urgency. Musculoskeletal Present- Back Pain and Joint Stiffness. Not Present- Joint Pain, Muscle Pain, Muscle Weakness and Swelling of Extremities. Neurological Not Present- Decreased Memory, Fainting, Headaches, Numbness, Seizures, Tingling, Tremor, Trouble walking and Weakness. Psychiatric Not Present- Anxiety, Bipolar, Change in Sleep Pattern, Depression, Fearful and Frequent crying. Endocrine Not Present- Cold Intolerance, Excessive Hunger, Hair Changes, Heat Intolerance, Hot flashes and New Diabetes. Hematology Not Present- Easy Bruising, Excessive bleeding, Gland problems, HIV and Persistent Infections.  Vitals [employer] R. Brooks CMA; 09/17/2014 1:59 PM) 09/17/2014 1:59 PM Weight: 185.25 lb Height: 62in Body Surface Area: 1.92 m Body Mass Index: 33.88  kg/m Pulse: 76 (Regular)  Resp.: 16 (Unlabored)  BP: 120/84 (Sitting, Left Arm, Standard)     Physical Exam Odis Hollingshead MD; 09/17/2014 6:23 PM)  The physical exam findings are as follows: Note:General: Overweight female in NAD. Pleasant and cooperative. Her husband is present.  EYES: EOMI, no icterus  NECK: Supple, no obvious mass.  CV: RRR, no murmur, no JVD.  CHEST: Breath sounds equal and clear. Respirations nonlabored. Port-a-cath in right upper chest wall. Left chest wall scar with no chest wall nodules.  RIGHT BREAST: 5 cm fullness in lateral breast that is tender with some ecchymosis and small healing wound.  ABDOMEN: Soft, nontender, nondistended, no masses  LYMPHATIC: No palpable cervical, supraclavicular, axillary adenopathy.  NEUROLOGIC: Alert and oriented, answers questions appropriately  PSYCHIATRIC: Normal mood, affect , and behavior.    Assessment & Plan Odis Hollingshead MD; 09/17/2014 6:27 PM)  BREAST MASS, RIGHT [phone]  [id number]) Impression: Image guided biopsy of the upper mass was benign. It was felt that the lower mass may represent similar pathology. She has a personal history of left breast cancer. Removal of the mass would leave a significant cosmetic deformity. The mass is also painful.  Plan: I feel a right mastectomy is reasonable in this case. She would like to proceed with this. I have explained the procedure, risks, and aftercare to her. Risks include but are not limited to bleeding, infection, wound problems, anesthesia, chronic chest wall pain, seroma formation, need remove port-a-cath. She understands that the risks are higher because of her chronic immunosuppressed state.Vitamin A around the time of surgery has been recommended. She seems to understand all of this and wants to proceed. She will need a preoperative anesthesia consult.  Jackolyn Confer, M.D.

## 2014-09-25 NOTE — Transfer of Care (Signed)
Immediate Anesthesia Transfer of Care Note  Patient: Daisy [name]  Procedure(s) Performed: Procedure(s): RIGHT MASTECTOMY (Right)  Patient Location: PACU  Anesthesia Type:General  Level of Consciousness: awake, alert , oriented and patient cooperative  Airway & Oxygen Therapy: Patient Spontanous Breathing and Patient connected to face mask oxygen  Post-op Assessment: Report given to PACU RN, Post -op Vital signs reviewed and stable and Patient moving all extremities  Post vital signs: Reviewed and stable  Complications: No apparent anesthesia complications

## 2014-09-25 NOTE — Discharge Instructions (Signed)
[facility] [phone]  MASTECTOMY: POST OP INSTRUCTIONS  Always review your discharge instruction sheet given to you by the facility where your surgery was performed. IF YOU HAVE DISABILITY OR FAMILY LEAVE FORMS, YOU MUST BRING THEM TO THE OFFICE FOR PROCESSING.   DO NOT GIVE THEM TO YOUR DOCTOR. A prescription for pain medication may be given to you upon discharge.  Take your pain medication as prescribed, if needed.  If narcotic pain medicine is not needed, then you may take acetaminophen (Tylenol) or ibuprofen (Advil) as needed. 1. Take your usually prescribed medications unless otherwise directed. 2. If you need a refill on your pain medication, please contact your pharmacy.  They will contact our office to request authorization.  Prescriptions will not be filled after 5pm or on week-ends. 3. You should follow a light diet the first few days after arrival home, such as soup and crackers, etc.  Resume your normal diet the day after surgery. 4. Most patients will experience some swelling and bruising on the chest and underarm.  Ice packs will help.  Swelling and bruising can take several days to resolve.  5. It is common to experience some constipation if taking pain medication after surgery.  Increasing fluid intake and taking a stool softener (such as Colace) will usually help or prevent this problem from occurring.  A mild laxative (Milk of Magnesia or Miralax) should be taken according to package instructions if there are no bowel movements after 48 hours. 6. Unless discharge instructions indicate otherwise, leave your bandage dry and in place until your next appointment.  You may take a limited sponge bath.  No tube baths or showers until the drains are removed.  You may have steri-strips (small skin tapes) in place directly over the incision.  These strips should be left on the skin for 7-10 days.  If your surgeon used skin glue on the incision, you may shower in 24  hours.  The glue will flake off over the next 2-3 weeks.  Any sutures or staples will be removed at the office during your follow-up visit. 7. DRAINS:  If you have drains in place, it is important to keep a list of the amount of drainage produced each day in your drains.  Before leaving the hospital, you should be instructed on drain care.  Call our office if you have any questions about your drains. 8. ACTIVITIES:  You may resume regular (light) daily activities beginning the next day--such as daily self-care, walking, climbing stairs--gradually increasing activities as tolerated.  You may have sexual intercourse when it is comfortable.  Refrain from any heavy lifting or straining until approved by your doctor. a. You may drive when you are no longer taking prescription pain medication, you can comfortably wear a seatbelt, and you can safely maneuver your car and apply brakes. b. RETURN TO WORK:  __________________________________________________________ 9. You should see your doctor in the office for a follow-up appointment.  We will call you with your appointment day and time.  10. OTHER INSTRUCTIONS: ______________________________________________________________________________________________ ____________________________________________________________________________________________ WHEN TO CALL YOUR DOCTOR: 1. Fever over 101.0 2. Nausea and/or vomiting 3. Extreme swelling or bruising 4. Continued bleeding from incision. 5. Increased pain, redness, or drainage from the incision. The clinic staff is available to answer your questions during regular business hours.  Please dont hesitate to call and ask to speak to one of the nurses for clinical concerns.  If you have a medical emergency, go to the nearest emergency room  or call 911.  A surgeon from [facility] is always on call at the hospital. [address] [place], [place], [place]  [address] ? [address], [place], [place] [phone] ? [phone] ? FAX [phone] Web site: [email or web address]

## 2014-09-25 NOTE — Interval H&P Note (Signed)
History and Physical Interval Note:  09/25/2014 10:29 AM  [name]  has presented today for surgery, with the diagnosis of right breast mass, h/o breast cancer  The various methods of treatment have been discussed with the patient and family. After consideration of risks, benefits and other options for treatment, the patient has consented to  Procedure(s): RIGHT MASTECTOMY (Right) as a surgical intervention .  The patient's history has been reviewed, patient examined, no change in status, stable for surgery.  I have reviewed the patient's chart and labs.  Questions were answered to the patient's satisfaction.     Torrell Krutz Lenna Sciara

## 2014-09-26 DIAGNOSIS — N6011 Diffuse cystic mastopathy of right breast: Secondary | ICD-10-CM | POA: Diagnosis not present

## 2014-09-26 MED ORDER — HEPARIN SOD (PORK) LOCK FLUSH 100 UNIT/ML IV SOLN
500.0000 [IU] | INTRAVENOUS | Status: AC | PRN
  Administered 2014-09-26: 500 [IU]

## 2014-09-26 NOTE — Progress Notes (Signed)
1 Day Post-Op  Subjective: No complaints. She feels comfortable going home  Objective: Vital signs in last 24 hours: Temp:  [97.5 F (36.4 C)-98.6 F (37 C)] 97.7 F (36.5 C) (10/24 0600) Pulse Rate:  [68-94] 94 (10/24 0600) Resp:  [12-18] 18 (10/24 0600) BP: (102-148)/(51-75) 104/66 mmHg (10/24 0600) SpO2:  [92 %-100 %] 94 % (10/24 0600) Weight:  [184 lb (83.462 kg)] 184 lb (83.462 kg) (10/23 0942) Last BM Date: 09/24/14  Intake/Output from previous day: 10/23 0701 - 10/24 0700 In: 3140 [P.O.:360; I.V.:2780] Out: 525 [Urine:450; Drains:55; Blood:20] Intake/Output this shift:    Resp: clear to auscultation bilaterally Chest wall: skin flaps look viable Cardio: regular rate and rhythm GI: soft, non-tender; bowel sounds normal; no masses,  no organomegaly  Lab Results:   Recent Labs  09/23/14 1105  WBC 9.2  HGB 13.6  HCT 41.9  PLT 208   BMET  Recent Labs  09/23/14 1105  NA 141  K 4.9  CL 101  CO2 25  GLUCOSE 107*  BUN 20  CREATININE 0.81  CALCIUM 9.3   PT/INR  Recent Labs  09/23/14 1105  LABPROT 13.8  INR 1.05   ABG No results found for this basename: PHART, PCO2, PO2, HCO3,  in the last 72 hours  Studies/Results: No results found.  Anti-infectives: Anti-infectives   Start     Dose/Rate Route Frequency Ordered Stop   09/25/14 1800  ceFAZolin (ANCEF) IVPB 1 g/50 mL premix     1 g 100 mL/hr over 30 Minutes Intravenous 3 times per day 09/25/14 1455 09/25/14 1808   09/25/14 0908  ceFAZolin (ANCEF) IVPB 2 g/50 mL premix     2 g 100 mL/hr over 30 Minutes Intravenous On call to O.R. 09/25/14 0908 09/25/14 1115      Assessment/Plan: s/p Procedure(s): RIGHT MASTECTOMY (Right) Discharge  LOS: 1 day    TOTH III,Novalie Leamy S 09/26/2014

## 2014-09-26 NOTE — Progress Notes (Signed)
Assessment unchanged. Pt and husband verbalized understanding of dc instructions through teach back. Scripts x 2 given as provided by MD. Niel Hummer care taught to pt and husband with verbalized and demonstrated understanding. Education material, drain record as well as supplies need for drain care provided. Pt discharged via wc to front entrance to meet awaiting vehicle to carry home. Accompanied by husband and NT.

## 2014-09-28 ENCOUNTER — Encounter (HOSPITAL_COMMUNITY): Payer: Self-pay | Admitting: General Surgery

## 2014-11-05 ENCOUNTER — Ambulatory Visit (HOSPITAL_BASED_OUTPATIENT_CLINIC_OR_DEPARTMENT_OTHER): Payer: BC Managed Care – PPO

## 2014-11-05 VITALS — BP 155/79 | HR 76 | Temp 98.7°F

## 2014-11-05 DIAGNOSIS — Z853 Personal history of malignant neoplasm of breast: Secondary | ICD-10-CM

## 2014-11-05 DIAGNOSIS — Z452 Encounter for adjustment and management of vascular access device: Secondary | ICD-10-CM

## 2014-11-05 DIAGNOSIS — Z95828 Presence of other vascular implants and grafts: Secondary | ICD-10-CM

## 2014-11-05 MED ORDER — HEPARIN SOD (PORK) LOCK FLUSH 100 UNIT/ML IV SOLN
500.0000 [IU] | Freq: Once | INTRAVENOUS | Status: AC
  Administered 2014-11-05: 500 [IU] via INTRAVENOUS
  Filled 2014-11-05: qty 5

## 2014-11-05 MED ORDER — SODIUM CHLORIDE 0.9 % IJ SOLN
10.0000 mL | INTRAMUSCULAR | Status: DC | PRN
  Administered 2014-11-05: 10 mL via INTRAVENOUS
  Filled 2014-11-05: qty 10

## 2014-11-05 NOTE — Patient Instructions (Signed)

## 2014-12-04 HISTORY — PX: EXTRACORPOREAL SHOCK WAVE LITHOTRIPSY: SHX1557

## 2014-12-23 ENCOUNTER — Ambulatory Visit (HOSPITAL_BASED_OUTPATIENT_CLINIC_OR_DEPARTMENT_OTHER): Payer: BLUE CROSS/BLUE SHIELD

## 2014-12-23 VITALS — BP 158/81 | HR 75 | Temp 98.4°F

## 2014-12-23 DIAGNOSIS — Z452 Encounter for adjustment and management of vascular access device: Secondary | ICD-10-CM

## 2014-12-23 DIAGNOSIS — Z95828 Presence of other vascular implants and grafts: Secondary | ICD-10-CM

## 2014-12-23 DIAGNOSIS — Z853 Personal history of malignant neoplasm of breast: Secondary | ICD-10-CM

## 2014-12-23 MED ORDER — SODIUM CHLORIDE 0.9 % IJ SOLN
10.0000 mL | INTRAMUSCULAR | Status: DC | PRN
  Administered 2014-12-23: 10 mL via INTRAVENOUS
  Filled 2014-12-23: qty 10

## 2014-12-23 MED ORDER — HEPARIN SOD (PORK) LOCK FLUSH 100 UNIT/ML IV SOLN
500.0000 [IU] | Freq: Once | INTRAVENOUS | Status: AC
  Administered 2014-12-23: 500 [IU] via INTRAVENOUS
  Filled 2014-12-23: qty 5

## 2014-12-23 NOTE — Patient Instructions (Signed)

## 2015-01-22 ENCOUNTER — Telehealth: Payer: Self-pay | Admitting: Oncology

## 2015-02-17 ENCOUNTER — Telehealth: Payer: Self-pay | Admitting: *Deleted

## 2015-02-17 ENCOUNTER — Telehealth: Payer: Self-pay | Admitting: Oncology

## 2015-02-17 ENCOUNTER — Other Ambulatory Visit: Payer: Self-pay | Admitting: *Deleted

## 2015-02-17 NOTE — Telephone Encounter (Signed)
PT CALLED TO R/S FLUSH APPT FROM 3/16 TO 3/25

## 2015-02-17 NOTE — Telephone Encounter (Signed)
Received message from pt stating that she has appt this am for port flush.  However, pt has shingles - started yesterday.  Attempted to call pt back unsuccessfully.  Unable to leave message due to phone just ringing and no voice mail.  Attempted x 2.  New Onc tx sent to scheduler. Pt's phone   [phone].

## 2015-02-17 NOTE — Telephone Encounter (Signed)
Patient called.  She was scheduled to have her PAC flushed today (last 12-23-14).  However, she is unable to come due to shingles outbreak.  She has had this multiple times.  She will use the powder that Dr.Livesay gave her when they break thru the skin. In the meantime, she will call her PCP, Dr. Harrington Challenger.  She would like to reschedule her PAC flush for Good Friday 02/26/15) when her husband is able to drive her.  She is unable to drive herself due to MS.  Transferred her to Audie Clear to r/s PAC flush.  Will let Dr. Marko Plume and her nurse Barbaraann Share know.

## 2015-02-26 ENCOUNTER — Ambulatory Visit (HOSPITAL_BASED_OUTPATIENT_CLINIC_OR_DEPARTMENT_OTHER): Payer: BLUE CROSS/BLUE SHIELD

## 2015-02-26 VITALS — BP 132/75 | HR 89 | Temp 98.6°F

## 2015-02-26 DIAGNOSIS — Z452 Encounter for adjustment and management of vascular access device: Secondary | ICD-10-CM | POA: Diagnosis not present

## 2015-02-26 DIAGNOSIS — Z853 Personal history of malignant neoplasm of breast: Secondary | ICD-10-CM | POA: Diagnosis not present

## 2015-02-26 DIAGNOSIS — Z95828 Presence of other vascular implants and grafts: Secondary | ICD-10-CM

## 2015-02-26 MED ORDER — HEPARIN SOD (PORK) LOCK FLUSH 100 UNIT/ML IV SOLN
500.0000 [IU] | Freq: Once | INTRAVENOUS | Status: AC
  Administered 2015-02-26: 500 [IU] via INTRAVENOUS
  Filled 2015-02-26: qty 5

## 2015-02-26 MED ORDER — SODIUM CHLORIDE 0.9 % IJ SOLN
10.0000 mL | INTRAMUSCULAR | Status: DC | PRN
  Administered 2015-02-26: 10 mL via INTRAVENOUS
  Filled 2015-02-26: qty 10

## 2015-02-26 NOTE — Patient Instructions (Signed)

## 2015-03-16 ENCOUNTER — Other Ambulatory Visit: Payer: Self-pay | Admitting: Gastroenterology

## 2015-03-17 ENCOUNTER — Other Ambulatory Visit: Payer: Self-pay | Admitting: Gastroenterology

## 2015-03-17 DIAGNOSIS — IMO0001 Reserved for inherently not codable concepts without codable children: Secondary | ICD-10-CM

## 2015-03-17 DIAGNOSIS — K219 Gastro-esophageal reflux disease without esophagitis: Principal | ICD-10-CM

## 2015-03-22 ENCOUNTER — Ambulatory Visit
Admission: RE | Admit: 2015-03-22 | Discharge: 2015-03-22 | Disposition: A | Discharge status: Home / Self Care | Payer: BLUE CROSS/BLUE SHIELD | Source: Ambulatory Visit | Attending: Gastroenterology | Admitting: Gastroenterology

## 2015-03-22 DIAGNOSIS — K219 Gastro-esophageal reflux disease without esophagitis: Principal | ICD-10-CM

## 2015-03-22 DIAGNOSIS — IMO0001 Reserved for inherently not codable concepts without codable children: Secondary | ICD-10-CM

## 2015-03-26 ENCOUNTER — Other Ambulatory Visit: Payer: Self-pay | Admitting: Neurological Surgery

## 2015-04-06 ENCOUNTER — Other Ambulatory Visit (HOSPITAL_COMMUNITY): Payer: Self-pay

## 2015-04-06 ENCOUNTER — Encounter (HOSPITAL_COMMUNITY): Payer: Self-pay

## 2015-04-06 ENCOUNTER — Encounter (HOSPITAL_COMMUNITY)
Admission: RE | Admit: 2015-04-06 | Discharge: 2015-04-06 | Disposition: A | Discharge status: Home / Self Care | Payer: BLUE CROSS/BLUE SHIELD | Source: Ambulatory Visit | Attending: Neurological Surgery | Admitting: Neurological Surgery

## 2015-04-06 DIAGNOSIS — E2740 Unspecified adrenocortical insufficiency: Secondary | ICD-10-CM | POA: Diagnosis not present

## 2015-04-06 DIAGNOSIS — Y838 Other surgical procedures as the cause of abnormal reaction of the patient, or of later complication, without mention of misadventure at the time of the procedure: Secondary | ICD-10-CM | POA: Diagnosis not present

## 2015-04-06 DIAGNOSIS — Z87442 Personal history of urinary calculi: Secondary | ICD-10-CM | POA: Diagnosis not present

## 2015-04-06 DIAGNOSIS — G4733 Obstructive sleep apnea (adult) (pediatric): Secondary | ICD-10-CM | POA: Diagnosis not present

## 2015-04-06 DIAGNOSIS — Z9981 Dependence on supplemental oxygen: Secondary | ICD-10-CM | POA: Diagnosis not present

## 2015-04-06 DIAGNOSIS — Z91041 Radiographic dye allergy status: Secondary | ICD-10-CM | POA: Diagnosis not present

## 2015-04-06 DIAGNOSIS — T8589XA Other specified complication of internal prosthetic devices, implants and grafts, not elsewhere classified, initial encounter: Secondary | ICD-10-CM | POA: Diagnosis present

## 2015-04-06 DIAGNOSIS — J45909 Unspecified asthma, uncomplicated: Secondary | ICD-10-CM | POA: Diagnosis not present

## 2015-04-06 DIAGNOSIS — E039 Hypothyroidism, unspecified: Secondary | ICD-10-CM | POA: Diagnosis not present

## 2015-04-06 DIAGNOSIS — Z882 Allergy status to sulfonamides status: Secondary | ICD-10-CM | POA: Diagnosis not present

## 2015-04-06 DIAGNOSIS — Y92234 Operating room of hospital as the place of occurrence of the external cause: Secondary | ICD-10-CM | POA: Diagnosis not present

## 2015-04-06 DIAGNOSIS — Z853 Personal history of malignant neoplasm of breast: Secondary | ICD-10-CM | POA: Diagnosis not present

## 2015-04-06 DIAGNOSIS — E271 Primary adrenocortical insufficiency: Secondary | ICD-10-CM | POA: Diagnosis not present

## 2015-04-06 DIAGNOSIS — Z79899 Other long term (current) drug therapy: Secondary | ICD-10-CM | POA: Diagnosis not present

## 2015-04-06 DIAGNOSIS — Q780 Osteogenesis imperfecta: Secondary | ICD-10-CM | POA: Diagnosis not present

## 2015-04-06 DIAGNOSIS — F419 Anxiety disorder, unspecified: Secondary | ICD-10-CM | POA: Diagnosis not present

## 2015-04-06 DIAGNOSIS — M797 Fibromyalgia: Secondary | ICD-10-CM | POA: Diagnosis not present

## 2015-04-06 DIAGNOSIS — K219 Gastro-esophageal reflux disease without esophagitis: Secondary | ICD-10-CM | POA: Diagnosis not present

## 2015-04-06 DIAGNOSIS — Z881 Allergy status to other antibiotic agents status: Secondary | ICD-10-CM | POA: Diagnosis not present

## 2015-04-06 DIAGNOSIS — Z888 Allergy status to other drugs, medicaments and biological substances status: Secondary | ICD-10-CM | POA: Diagnosis not present

## 2015-04-06 DIAGNOSIS — J449 Chronic obstructive pulmonary disease, unspecified: Secondary | ICD-10-CM | POA: Diagnosis not present

## 2015-04-06 DIAGNOSIS — G35 Multiple sclerosis: Secondary | ICD-10-CM | POA: Diagnosis not present

## 2015-04-06 DIAGNOSIS — I10 Essential (primary) hypertension: Secondary | ICD-10-CM | POA: Diagnosis not present

## 2015-04-06 HISTORY — DX: Osteogenesis imperfecta: Q78.0

## 2015-04-06 HISTORY — DX: Reserved for inherently not codable concepts without codable children: IMO0001

## 2015-04-06 HISTORY — DX: Malignant neoplasm of nipple and areola, unspecified female breast: C50.019

## 2015-04-06 HISTORY — DX: Other specified postprocedural states: Z98.890

## 2015-04-06 HISTORY — DX: Other specified postprocedural states: R11.2

## 2015-04-06 LAB — COMPREHENSIVE METABOLIC PANEL
ALT: 14 U/L (ref 14–54)
ANION GAP: 11 (ref 5–15)
AST: 25 U/L (ref 15–41)
Albumin: 3.8 g/dL (ref 3.5–5.0)
Alkaline Phosphatase: 61 U/L (ref 38–126)
BUN: 17 mg/dL (ref 6–20)
CALCIUM: 8.8 mg/dL — AB (ref 8.9–10.3)
CO2: 26 mmol/L (ref 22–32)
CREATININE: 0.82 mg/dL (ref 0.44–1.00)
Chloride: 105 mmol/L (ref 101–111)
GFR calc Af Amer: >60 mL/min (ref >60)
GLUCOSE: 114 mg/dL — AB (ref 70–99)
Potassium: 4.6 mmol/L (ref 3.5–5.1)
Sodium: 142 mmol/L (ref 135–145)
Total Bilirubin: 0.6 mg/dL (ref 0.3–1.2)
Total Protein: 6.6 g/dL (ref 6.5–8.1)

## 2015-04-06 LAB — SURGICAL PCR SCREEN
MRSA, PCR: NEGATIVE
Staphylococcus aureus: NEGATIVE

## 2015-04-06 LAB — CBC
HCT: 38.6 % (ref 36.0–46.0)
HEMOGLOBIN: 12.2 g/dL (ref 12.0–15.0)
MCH: 26.5 pg (ref 26.0–34.0)
MCHC: 31.6 g/dL (ref 30.0–36.0)
MCV: 83.9 fL (ref 78.0–100.0)
Platelets: 207 10*3/uL (ref 150–400)
RBC: 4.6 MIL/uL (ref 3.87–5.11)
RDW: 15.2 % (ref 11.5–15.5)
WBC: 7.8 10*3/uL (ref 4.0–10.5)

## 2015-04-06 NOTE — Pre-Procedure Instructions (Signed)
Daisy [name]  04/06/2015   Your procedure is scheduled on:  Friday, May 6  Report to [facility] Main Entrance "A" at 10:00 AM.  Call this number if you have problems the morning of surgery: [phone]   Remember:   Do not eat food or drink liquids after midnight.   Take these medicines the morning of surgery with A SIP OF WATER: symbicort as needed (bring to hospital), clonazepam (klonopin), hydroxyzine (atarax) if needed, xopenex if needed (bring to hospital), levothyroxine (synthroid), methocarbamol (robaxin), metoprolol(lopressor), morphine if needed, omeprazole (prilosec), zofran if needed, oxycodone if needed, prednisone, phenazopyridine (azo) if needed   Do not wear jewelry, make-up or nail polish.  Do not wear lotions, powders, or perfumes. You may wear deodorant.  Do not shave 48 hours prior to surgery. Men may shave face and neck.  Do not bring valuables to the hospital.  [facility] is not responsible                  for any belongings or valuables.               Contacts, dentures or bridgework may not be worn into surgery.  Leave suitcase in the car. After surgery it may be brought to your room.  For patients admitted to the hospital, discharge time is determined by your                treatment team.               Patients discharged the day of surgery will not be allowed to drive  home.  Name and phone number of your driver:  Special Instructions:  Review preparing for surgery handout   Please read over the following fact sheets that you were given: Pain Booklet, Coughing and Deep Breathing, MRSA Information and Surgical Site Infection Prevention

## 2015-04-06 NOTE — Progress Notes (Signed)
PCP: Dr. Melinda Crutch Oncologist: Dr.LIvasey  Pt. States she doesn't see a pulm. doctor any more, Dr. Harrington Challenger handles her issues. Pt. States she is not on oxygen at night. Doesn't use cpap , due to it bothering her salivary glands. Requested sleep study From Dr. Westley Hummer at [facility].

## 2015-04-07 ENCOUNTER — Encounter (HOSPITAL_COMMUNITY): Payer: Self-pay

## 2015-04-07 NOTE — Progress Notes (Signed)
Anesthesia Chart Review:  Pt is 64 year old female scheduled for spinal cord stimulator revision and replacement of paddle electrode on 04/09/2015 with Dr. Ellene Route.   PMH includes: HTN, asthma, OSA (was using oxygen at night with CPAP but is no longer using CPAP, no longer using oxygen), COPD, anemia, hepatitis B, multiple sclerosis, breast cancer, Addison's disease, hypothyroidism, adrenal insufficiency, GERD.  Never smoker. BMI 36. S/p R mastectomy 09/25/14. S/p EGD 04/17/14. S/p port-a-cath insertion 10/30/12.   Pt with multiple medication allergies.   Medications include: hydroxyzine, levothyroxine, losartan, metoprolol, prednisone  Preoperative labs reviewed.    EKG: NSR.   Nuclear stress test 06/28/2011: No evidence for inducible perfusion defect within the left ventricular myocardium. Normal left ventricular ejection fraction.  Echo 11/17/2009:  Left ventricle: The cavity size was normal. Wall thickness wasnormal. Systolic function was normal. The estimated ejectionfraction was in the range of 60% to 65%. Wall motion was normal;there were no regional wall motion abnormalities. Doppler parametersare consistent with abnormal left ventricular relaxation (grade 1diastolic dysfunction).    If no changes, I anticipate pt can proceed with surgery as scheduled.   Willeen Cass, FNP-BC [facility]/Anesthesiology Phone: [phone] 04/07/2015 1:48 PM

## 2015-04-08 MED ORDER — CEFAZOLIN SODIUM-DEXTROSE 2-3 GM-% IV SOLR
2.0000 g | INTRAVENOUS | Status: AC
  Administered 2015-04-09: 2 g via INTRAVENOUS
  Filled 2015-04-08: qty 50

## 2015-04-09 ENCOUNTER — Encounter (HOSPITAL_COMMUNITY): Payer: Self-pay | Admitting: *Deleted

## 2015-04-09 ENCOUNTER — Ambulatory Visit (HOSPITAL_COMMUNITY): Payer: BLUE CROSS/BLUE SHIELD | Admitting: Certified Registered Nurse Anesthetist

## 2015-04-09 ENCOUNTER — Ambulatory Visit (HOSPITAL_COMMUNITY): Payer: BLUE CROSS/BLUE SHIELD | Admitting: Emergency Medicine

## 2015-04-09 ENCOUNTER — Ambulatory Visit (HOSPITAL_COMMUNITY): Payer: BLUE CROSS/BLUE SHIELD

## 2015-04-09 ENCOUNTER — Encounter (HOSPITAL_COMMUNITY)
Admission: RE | Disposition: A | Discharge status: Home / Self Care | Payer: Self-pay | Source: Ambulatory Visit | Attending: Neurological Surgery

## 2015-04-09 ENCOUNTER — Ambulatory Visit (HOSPITAL_COMMUNITY)
Admission: RE | Admit: 2015-04-09 | Discharge: 2015-04-10 | Disposition: A | Discharge status: Home / Self Care | Payer: BLUE CROSS/BLUE SHIELD | Source: Ambulatory Visit | Attending: Neurological Surgery | Admitting: Neurological Surgery

## 2015-04-09 DIAGNOSIS — J449 Chronic obstructive pulmonary disease, unspecified: Secondary | ICD-10-CM | POA: Insufficient documentation

## 2015-04-09 DIAGNOSIS — E2740 Unspecified adrenocortical insufficiency: Secondary | ICD-10-CM | POA: Insufficient documentation

## 2015-04-09 DIAGNOSIS — M797 Fibromyalgia: Secondary | ICD-10-CM | POA: Insufficient documentation

## 2015-04-09 DIAGNOSIS — E039 Hypothyroidism, unspecified: Secondary | ICD-10-CM | POA: Insufficient documentation

## 2015-04-09 DIAGNOSIS — G35 Multiple sclerosis: Secondary | ICD-10-CM | POA: Insufficient documentation

## 2015-04-09 DIAGNOSIS — Z79899 Other long term (current) drug therapy: Secondary | ICD-10-CM | POA: Insufficient documentation

## 2015-04-09 DIAGNOSIS — Z881 Allergy status to other antibiotic agents status: Secondary | ICD-10-CM | POA: Insufficient documentation

## 2015-04-09 DIAGNOSIS — F419 Anxiety disorder, unspecified: Secondary | ICD-10-CM | POA: Insufficient documentation

## 2015-04-09 DIAGNOSIS — E271 Primary adrenocortical insufficiency: Secondary | ICD-10-CM | POA: Insufficient documentation

## 2015-04-09 DIAGNOSIS — G4733 Obstructive sleep apnea (adult) (pediatric): Secondary | ICD-10-CM | POA: Insufficient documentation

## 2015-04-09 DIAGNOSIS — T8589XA Other specified complication of internal prosthetic devices, implants and grafts, not elsewhere classified, initial encounter: Secondary | ICD-10-CM | POA: Insufficient documentation

## 2015-04-09 DIAGNOSIS — Z87442 Personal history of urinary calculi: Secondary | ICD-10-CM | POA: Insufficient documentation

## 2015-04-09 DIAGNOSIS — Z882 Allergy status to sulfonamides status: Secondary | ICD-10-CM | POA: Insufficient documentation

## 2015-04-09 DIAGNOSIS — Z853 Personal history of malignant neoplasm of breast: Secondary | ICD-10-CM | POA: Insufficient documentation

## 2015-04-09 DIAGNOSIS — T85192A Other mechanical complication of implanted electronic neurostimulator (electrode) of spinal cord, initial encounter: Secondary | ICD-10-CM | POA: Diagnosis present

## 2015-04-09 DIAGNOSIS — I10 Essential (primary) hypertension: Secondary | ICD-10-CM | POA: Insufficient documentation

## 2015-04-09 DIAGNOSIS — Q780 Osteogenesis imperfecta: Secondary | ICD-10-CM | POA: Insufficient documentation

## 2015-04-09 DIAGNOSIS — J45909 Unspecified asthma, uncomplicated: Secondary | ICD-10-CM | POA: Insufficient documentation

## 2015-04-09 DIAGNOSIS — Z9981 Dependence on supplemental oxygen: Secondary | ICD-10-CM | POA: Insufficient documentation

## 2015-04-09 DIAGNOSIS — K219 Gastro-esophageal reflux disease without esophagitis: Secondary | ICD-10-CM | POA: Insufficient documentation

## 2015-04-09 DIAGNOSIS — Z91041 Radiographic dye allergy status: Secondary | ICD-10-CM | POA: Insufficient documentation

## 2015-04-09 DIAGNOSIS — Z888 Allergy status to other drugs, medicaments and biological substances status: Secondary | ICD-10-CM | POA: Insufficient documentation

## 2015-04-09 DIAGNOSIS — Z419 Encounter for procedure for purposes other than remedying health state, unspecified: Secondary | ICD-10-CM

## 2015-04-09 DIAGNOSIS — Y92234 Operating room of hospital as the place of occurrence of the external cause: Secondary | ICD-10-CM | POA: Insufficient documentation

## 2015-04-09 DIAGNOSIS — Y838 Other surgical procedures as the cause of abnormal reaction of the patient, or of later complication, without mention of misadventure at the time of the procedure: Secondary | ICD-10-CM | POA: Insufficient documentation

## 2015-04-09 HISTORY — PX: SPINAL CORD STIMULATOR INSERTION: SHX5378

## 2015-04-09 SURGERY — INSERTION, SPINAL CORD STIMULATOR, LUMBAR
Anesthesia: General

## 2015-04-09 MED ORDER — SODIUM CHLORIDE 0.9 % IJ SOLN
3.0000 mL | Freq: Two times a day (BID) | INTRAMUSCULAR | Status: DC
  Administered 2015-04-09 – 2015-04-10 (×2): 3 mL via INTRAVENOUS

## 2015-04-09 MED ORDER — METHOCARBAMOL 500 MG PO TABS
500.0000 mg | ORAL_TABLET | Freq: Four times a day (QID) | ORAL | Status: DC | PRN
  Administered 2015-04-09: 500 mg via ORAL
  Filled 2015-04-09 (×2): qty 1

## 2015-04-09 MED ORDER — ACETAMINOPHEN 650 MG RE SUPP: 650.0000 mg | RECTAL | Status: DC | PRN

## 2015-04-09 MED ORDER — OXYCODONE-ACETAMINOPHEN 5-325 MG PO TABS
1.0000 | ORAL_TABLET | ORAL | Status: DC | PRN
  Administered 2015-04-09: 2 via ORAL
  Filled 2015-04-09: qty 2

## 2015-04-09 MED ORDER — PROPOFOL 10 MG/ML IV BOLUS
INTRAVENOUS | Status: DC | PRN
  Administered 2015-04-09: 150 mg via INTRAVENOUS

## 2015-04-09 MED ORDER — RISAQUAD PO CAPS
2.0000 | ORAL_CAPSULE | Freq: Every day | ORAL | Status: DC
  Administered 2015-04-09 – 2015-04-10 (×2): 2 via ORAL
  Filled 2015-04-09 (×2): qty 2

## 2015-04-09 MED ORDER — NEOSTIGMINE METHYLSULFATE 10 MG/10ML IV SOLN
INTRAVENOUS | Status: DC | PRN
  Administered 2015-04-09: 3 mg via INTRAVENOUS

## 2015-04-09 MED ORDER — LEVALBUTEROL TARTRATE 45 MCG/ACT IN AERO: 2.0000 | INHALATION_SPRAY | RESPIRATORY_TRACT | Status: DC | PRN

## 2015-04-09 MED ORDER — TACROLIMUS 0.1 % EX OINT: 1.0000 "application " | TOPICAL_OINTMENT | Freq: Every day | CUTANEOUS | Status: DC

## 2015-04-09 MED ORDER — LEVOTHYROXINE SODIUM 25 MCG PO TABS: 75.0000 ug | ORAL_TABLET | ORAL | Status: DC

## 2015-04-09 MED ORDER — ACETAMINOPHEN 325 MG PO TABS: 650.0000 mg | ORAL_TABLET | ORAL | Status: DC | PRN

## 2015-04-09 MED ORDER — DOCUSATE SODIUM 100 MG PO CAPS
100.0000 mg | ORAL_CAPSULE | Freq: Two times a day (BID) | ORAL | Status: DC
  Administered 2015-04-09 – 2015-04-10 (×2): 100 mg via ORAL
  Filled 2015-04-09 (×2): qty 1

## 2015-04-09 MED ORDER — ONDANSETRON HCL 4 MG/2ML IJ SOLN
4.0000 mg | Freq: Once | INTRAMUSCULAR | Status: AC | PRN
  Administered 2015-04-09: 4 mg via INTRAVENOUS

## 2015-04-09 MED ORDER — HEMOSTATIC AGENTS (NO CHARGE) OPTIME
TOPICAL | Status: DC | PRN
  Administered 2015-04-09: 1 via TOPICAL

## 2015-04-09 MED ORDER — ALUM & MAG HYDROXIDE-SIMETH 200-200-20 MG/5ML PO SUSP: 30.0000 mL | Freq: Four times a day (QID) | ORAL | Status: DC | PRN

## 2015-04-09 MED ORDER — LIDOCAINE HCL (CARDIAC) 20 MG/ML IV SOLN
INTRAVENOUS | Status: DC | PRN
  Administered 2015-04-09: 50 mg via INTRAVENOUS

## 2015-04-09 MED ORDER — PHENYLEPHRINE HCL 10 MG/ML IJ SOLN
INTRAMUSCULAR | Status: DC | PRN
  Administered 2015-04-09: 80 ug via INTRAVENOUS
  Administered 2015-04-09: 40 ug via INTRAVENOUS

## 2015-04-09 MED ORDER — MIDAZOLAM HCL 5 MG/5ML IJ SOLN
INTRAMUSCULAR | Status: DC | PRN
  Administered 2015-04-09: 2 mg via INTRAVENOUS

## 2015-04-09 MED ORDER — LACTATED RINGERS IV SOLN
Freq: Once | INTRAVENOUS | Status: AC
  Administered 2015-04-09: 11:00:00 via INTRAVENOUS

## 2015-04-09 MED ORDER — METOPROLOL TARTRATE 12.5 MG HALF TABLET
12.5000 mg | ORAL_TABLET | Freq: Two times a day (BID) | ORAL | Status: DC
  Administered 2015-04-09 – 2015-04-10 (×2): 12.5 mg via ORAL
  Filled 2015-04-09 (×2): qty 1

## 2015-04-09 MED ORDER — MIDAZOLAM HCL 2 MG/2ML IJ SOLN
INTRAMUSCULAR | Status: AC
  Filled 2015-04-09: qty 2

## 2015-04-09 MED ORDER — PROPOFOL 10 MG/ML IV BOLUS
INTRAVENOUS | Status: AC
  Filled 2015-04-09: qty 20

## 2015-04-09 MED ORDER — CLONAZEPAM 1 MG PO TABS
1.0000 mg | ORAL_TABLET | Freq: Three times a day (TID) | ORAL | Status: DC
  Administered 2015-04-09 – 2015-04-10 (×2): 1 mg via ORAL
  Filled 2015-04-09 (×2): qty 1

## 2015-04-09 MED ORDER — FENTANYL CITRATE (PF) 250 MCG/5ML IJ SOLN
INTRAMUSCULAR | Status: AC
  Filled 2015-04-09: qty 5

## 2015-04-09 MED ORDER — MORPHINE SULFATE 4 MG/ML IJ SOLN
INTRAMUSCULAR | Status: AC
  Administered 2015-04-09: 4 mg
  Filled 2015-04-09: qty 1

## 2015-04-09 MED ORDER — THROMBIN 5000 UNITS EX SOLR
OROMUCOSAL | Status: DC | PRN
  Administered 2015-04-09: 14:00:00 via TOPICAL

## 2015-04-09 MED ORDER — LACTATED RINGERS IV SOLN
INTRAVENOUS | Status: DC | PRN
  Administered 2015-04-09: 12:00:00 via INTRAVENOUS

## 2015-04-09 MED ORDER — FENTANYL CITRATE (PF) 100 MCG/2ML IJ SOLN
INTRAMUSCULAR | Status: DC | PRN
  Administered 2015-04-09: 100 ug via INTRAVENOUS
  Administered 2015-04-09: 50 ug via INTRAVENOUS

## 2015-04-09 MED ORDER — LIDOCAINE-EPINEPHRINE 1 %-1:100000 IJ SOLN
INTRAMUSCULAR | Status: DC | PRN
  Administered 2015-04-09: 5 mL

## 2015-04-09 MED ORDER — ARTIFICIAL TEARS OP OINT
TOPICAL_OINTMENT | OPHTHALMIC | Status: AC
  Filled 2015-04-09: qty 3.5

## 2015-04-09 MED ORDER — SODIUM CHLORIDE 0.9 % IJ SOLN: 3.0000 mL | INTRAMUSCULAR | Status: DC | PRN

## 2015-04-09 MED ORDER — ROCURONIUM BROMIDE 100 MG/10ML IV SOLN
INTRAVENOUS | Status: DC | PRN
Start: 2015-04-09 — End: 2015-04-09
  Administered 2015-04-09: 30 mg via INTRAVENOUS

## 2015-04-09 MED ORDER — THROMBIN 5000 UNITS EX SOLR
CUTANEOUS | Status: DC | PRN
  Administered 2015-04-09 (×2): 5000 [IU] via TOPICAL

## 2015-04-09 MED ORDER — SUFENTANIL CITRATE 50 MCG/ML IV SOLN
INTRAVENOUS | Status: AC
  Filled 2015-04-09: qty 1

## 2015-04-09 MED ORDER — PHENOL 1.4 % MT LIQD: 1.0000 | OROMUCOSAL | Status: DC | PRN

## 2015-04-09 MED ORDER — SODIUM CHLORIDE 0.9 % IR SOLN
Status: DC | PRN
  Administered 2015-04-09: 13:00:00

## 2015-04-09 MED ORDER — KETOROLAC TROMETHAMINE 15 MG/ML IJ SOLN
15.0000 mg | Freq: Four times a day (QID) | INTRAMUSCULAR | Status: DC
  Administered 2015-04-09 – 2015-04-10 (×3): 15 mg via INTRAVENOUS
  Filled 2015-04-09 (×3): qty 1

## 2015-04-09 MED ORDER — SODIUM CHLORIDE 0.9 % IV SOLN: 250.0000 mL | INTRAVENOUS | Status: DC

## 2015-04-09 MED ORDER — LEVOTHYROXINE SODIUM 50 MCG PO TABS
50.0000 ug | ORAL_TABLET | ORAL | Status: DC
  Administered 2015-04-09: 50 ug via ORAL
  Filled 2015-04-09: qty 1

## 2015-04-09 MED ORDER — SENNA 8.6 MG PO TABS
1.0000 | ORAL_TABLET | Freq: Two times a day (BID) | ORAL | Status: DC
  Administered 2015-04-09 – 2015-04-10 (×2): 8.6 mg via ORAL
  Filled 2015-04-09 (×2): qty 1

## 2015-04-09 MED ORDER — FLUMAZENIL 0.5 MG/5ML IV SOLN
INTRAVENOUS | Status: DC | PRN
  Administered 2015-04-09: 0.5 mg via INTRAVENOUS

## 2015-04-09 MED ORDER — MORPHINE SULFATE 15 MG PO TABS
30.0000 mg | ORAL_TABLET | Freq: Two times a day (BID) | ORAL | Status: DC
  Administered 2015-04-09 – 2015-04-10 (×2): 30 mg via ORAL
  Filled 2015-04-09 (×2): qty 2

## 2015-04-09 MED ORDER — BUPIVACAINE HCL (PF) 0.25 % IJ SOLN
INTRAMUSCULAR | Status: DC | PRN
  Administered 2015-04-09: 5 mL
  Administered 2015-04-09: 10 mL

## 2015-04-09 MED ORDER — CEFAZOLIN SODIUM 1-5 GM-% IV SOLN
1.0000 g | Freq: Three times a day (TID) | INTRAVENOUS | Status: AC
  Administered 2015-04-09 – 2015-04-10 (×2): 1 g via INTRAVENOUS
  Filled 2015-04-09 (×3): qty 50

## 2015-04-09 MED ORDER — MENTHOL 3 MG MT LOZG: 1.0000 | LOZENGE | OROMUCOSAL | Status: DC | PRN

## 2015-04-09 MED ORDER — PREDNISONE 1 MG PO TABS
4.0000 mg | ORAL_TABLET | Freq: Every day | ORAL | Status: DC
  Administered 2015-04-10: 4 mg via ORAL
  Filled 2015-04-09 (×2): qty 4

## 2015-04-09 MED ORDER — GLYCOPYRROLATE 0.2 MG/ML IJ SOLN
INTRAMUSCULAR | Status: DC | PRN
  Administered 2015-04-09: 0.4 mg via INTRAVENOUS

## 2015-04-09 MED ORDER — ONDANSETRON HCL 4 MG/2ML IJ SOLN
INTRAMUSCULAR | Status: AC
  Filled 2015-04-09: qty 2

## 2015-04-09 MED ORDER — METHOCARBAMOL 500 MG PO TABS
500.0000 mg | ORAL_TABLET | Freq: Four times a day (QID) | ORAL | Status: DC
  Administered 2015-04-09 – 2015-04-10 (×2): 500 mg via ORAL
  Filled 2015-04-09 (×2): qty 1

## 2015-04-09 MED ORDER — SENNA 8.6 MG PO TABS: 1.0000 | ORAL_TABLET | Freq: Every day | ORAL | Status: DC | PRN

## 2015-04-09 MED ORDER — LEVOTHYROXINE SODIUM 50 MCG PO TABS: 50.0000 ug | ORAL_TABLET | ORAL | Status: DC

## 2015-04-09 MED ORDER — MAGNESIUM CITRATE PO SOLN
1.0000 | Freq: Once | ORAL | Status: AC | PRN
  Filled 2015-04-09: qty 296

## 2015-04-09 MED ORDER — POLYETHYLENE GLYCOL 3350 17 G PO PACK: 17.0000 g | PACK | Freq: Every day | ORAL | Status: DC | PRN

## 2015-04-09 MED ORDER — 0.9 % SODIUM CHLORIDE (POUR BTL) OPTIME
TOPICAL | Status: DC | PRN
  Administered 2015-04-09: 1000 mL

## 2015-04-09 MED ORDER — METHOCARBAMOL 1000 MG/10ML IJ SOLN
500.0000 mg | Freq: Four times a day (QID) | INTRAMUSCULAR | Status: DC | PRN
  Filled 2015-04-09: qty 5

## 2015-04-09 MED ORDER — BUDESONIDE-FORMOTEROL FUMARATE 160-4.5 MCG/ACT IN AERO
2.0000 | INHALATION_SPRAY | Freq: Two times a day (BID) | RESPIRATORY_TRACT | Status: DC | PRN
  Filled 2015-04-09: qty 6

## 2015-04-09 MED ORDER — ROCURONIUM BROMIDE 50 MG/5ML IV SOLN
INTRAVENOUS | Status: AC
  Filled 2015-04-09: qty 1

## 2015-04-09 MED ORDER — HYDROMORPHONE HCL 1 MG/ML IJ SOLN: 0.5000 mg | INTRAMUSCULAR | Status: DC | PRN

## 2015-04-09 MED ORDER — MORPHINE SULFATE 2 MG/ML IJ SOLN
1.0000 mg | INTRAMUSCULAR | Status: DC | PRN
  Administered 2015-04-09 – 2015-04-10 (×2): 2 mg via INTRAVENOUS
  Filled 2015-04-09 (×2): qty 1

## 2015-04-09 MED ORDER — LABETALOL HCL 5 MG/ML IV SOLN
INTRAVENOUS | Status: AC
  Administered 2015-04-09: 2.5 mg
  Filled 2015-04-09: qty 4

## 2015-04-09 MED ORDER — ONDANSETRON HCL 4 MG/2ML IJ SOLN
4.0000 mg | INTRAMUSCULAR | Status: DC | PRN
  Filled 2015-04-09: qty 2

## 2015-04-09 MED ORDER — NYSTATIN 100000 UNIT/GM EX POWD
1.0000 g | Freq: Two times a day (BID) | CUTANEOUS | Status: DC
  Administered 2015-04-09 – 2015-04-10 (×2): 1 g via TOPICAL
  Filled 2015-04-09: qty 15

## 2015-04-09 MED ORDER — HYDROXYZINE HCL 25 MG PO TABS: 50.0000 mg | ORAL_TABLET | Freq: Four times a day (QID) | ORAL | Status: DC | PRN

## 2015-04-09 MED ORDER — OXYCODONE-ACETAMINOPHEN 5-325 MG PO TABS
ORAL_TABLET | ORAL | Status: AC
  Administered 2015-04-09: 2 via ORAL
  Filled 2015-04-09: qty 2

## 2015-04-09 MED ORDER — FLUMAZENIL 0.5 MG/5ML IV SOLN
INTRAVENOUS | Status: AC
  Filled 2015-04-09: qty 5

## 2015-04-09 MED ORDER — ONDANSETRON 4 MG PO TBDP: 4.0000 mg | ORAL_TABLET | Freq: Four times a day (QID) | ORAL | Status: DC | PRN

## 2015-04-09 MED ORDER — LEVALBUTEROL HCL 1.25 MG/0.5ML IN NEBU
1.2500 mg | INHALATION_SOLUTION | RESPIRATORY_TRACT | Status: DC | PRN
  Filled 2015-04-09: qty 0.5

## 2015-04-09 MED ORDER — BISACODYL 10 MG RE SUPP: 10.0000 mg | Freq: Every day | RECTAL | Status: DC | PRN

## 2015-04-09 MED ORDER — METHOCARBAMOL 500 MG PO TABS
ORAL_TABLET | ORAL | Status: AC
  Administered 2015-04-09: 500 mg via ORAL
  Filled 2015-04-09: qty 1

## 2015-04-09 MED ORDER — PANTOPRAZOLE SODIUM 40 MG PO TBEC
40.0000 mg | DELAYED_RELEASE_TABLET | Freq: Every day | ORAL | Status: DC
  Administered 2015-04-10: 40 mg via ORAL
  Filled 2015-04-09: qty 1

## 2015-04-09 SURGICAL SUPPLY — 73 items
ADH SKN CLS LQ APL DERMABOND (GAUZE/BANDAGES/DRESSINGS) ×1
APL SKNCLS STERI-STRIP NONHPOA (GAUZE/BANDAGES/DRESSINGS)
BAG DECANTER FOR FLEXI CONT (MISCELLANEOUS) ×3 IMPLANT
BENZOIN TINCTURE PRP APPL 2/3 (GAUZE/BANDAGES/DRESSINGS) IMPLANT
BIT DRILL NEURO 2X3.1 SFT TUCH (MISCELLANEOUS) ×1 IMPLANT
BLADE CLIPPER SURG (BLADE) IMPLANT
CLOSURE WOUND 1/2 X4 (GAUZE/BANDAGES/DRESSINGS)
CONT SPEC 4OZ CLIKSEAL STRL BL (MISCELLANEOUS) ×3 IMPLANT
CORDS BIPOLAR (ELECTRODE) ×1 IMPLANT
DECANTER SPIKE VIAL GLASS SM (MISCELLANEOUS) ×3 IMPLANT
DERMABOND ADHESIVE PROPEN (GAUZE/BANDAGES/DRESSINGS) ×2
DERMABOND ADVANCED .7 DNX6 (GAUZE/BANDAGES/DRESSINGS) IMPLANT
DRAPE C-ARM 42X72 X-RAY (DRAPES) ×9 IMPLANT
DRAPE LAPAROTOMY 100X72X124 (DRAPES) ×3 IMPLANT
DRAPE POUCH INSTRU U-SHP 10X18 (DRAPES) ×3 IMPLANT
DRILL NEURO 2X3.1 SOFT TOUCH (MISCELLANEOUS) ×3
DRSG OPSITE 4X5.5 SM (GAUZE/BANDAGES/DRESSINGS) ×1 IMPLANT
DRSG TELFA 3X8 NADH (GAUZE/BANDAGES/DRESSINGS) IMPLANT
DURAPREP 26ML APPLICATOR (WOUND CARE) ×3 IMPLANT
ELECT REM PT RETURN 9FT ADLT (ELECTROSURGICAL) ×3
ELECTRODE REM PT RTRN 9FT ADLT (ELECTROSURGICAL) ×1 IMPLANT
GAUZE SPONGE 4X4 16PLY XRAY LF (GAUZE/BANDAGES/DRESSINGS) IMPLANT
GLOVE BIO SURGEON STRL SZ7.5 (GLOVE) IMPLANT
GLOVE BIO SURGEON STRL SZ8 (GLOVE) ×2 IMPLANT
GLOVE BIO SURGEON STRL SZ8.5 (GLOVE) ×2 IMPLANT
GLOVE BIOGEL PI IND STRL 7.5 (GLOVE) IMPLANT
GLOVE BIOGEL PI IND STRL 8 (GLOVE) IMPLANT
GLOVE BIOGEL PI IND STRL 8.5 (GLOVE) ×1 IMPLANT
GLOVE BIOGEL PI INDICATOR 7.5 (GLOVE)
GLOVE BIOGEL PI INDICATOR 8 (GLOVE) ×6
GLOVE BIOGEL PI INDICATOR 8.5 (GLOVE) ×2
GLOVE ECLIPSE 7.5 STRL STRAW (GLOVE) ×8 IMPLANT
GLOVE ECLIPSE 8.5 STRL (GLOVE) ×3 IMPLANT
GLOVE EXAM NITRILE LRG STRL (GLOVE) IMPLANT
GLOVE EXAM NITRILE MD LF STRL (GLOVE) IMPLANT
GLOVE EXAM NITRILE XL STR (GLOVE) IMPLANT
GLOVE EXAM NITRILE XS STR PU (GLOVE) IMPLANT
GOWN STRL REUS W/ TWL LRG LVL3 (GOWN DISPOSABLE) IMPLANT
GOWN STRL REUS W/ TWL XL LVL3 (GOWN DISPOSABLE) ×1 IMPLANT
GOWN STRL REUS W/TWL 2XL LVL3 (GOWN DISPOSABLE) ×7 IMPLANT
GOWN STRL REUS W/TWL LRG LVL3 (GOWN DISPOSABLE)
GOWN STRL REUS W/TWL XL LVL3 (GOWN DISPOSABLE) ×3
HEMOSTAT POWDER SURGIFOAM 1G (HEMOSTASIS) ×2 IMPLANT
KIT BASIN OR (CUSTOM PROCEDURE TRAY) ×3 IMPLANT
KIT NEUROSTIMULATOR ACCESSORY (KITS) ×2 IMPLANT
KIT PASSING ELEVATOR (KITS) ×2 IMPLANT
KIT ROOM TURNOVER OR (KITS) ×3 IMPLANT
NEEDLE HYPO 22GX1.5 SAFETY (NEEDLE) ×3 IMPLANT
NS IRRIG 1000ML POUR BTL (IV SOLUTION) ×3 IMPLANT
PACK LAMINECTOMY NEURO (CUSTOM PROCEDURE TRAY) ×3 IMPLANT
PAD ARMBOARD 7.5X6 YLW CONV (MISCELLANEOUS) ×11 IMPLANT
PAD DRESSING TELFA 3X8 NADH (GAUZE/BANDAGES/DRESSINGS) ×1 IMPLANT
PROGRAMMER PATIENT (MISCELLANEOUS) ×2 IMPLANT
SPINAL CORD STIMULATOR CHARGING SYSTEM ×1 IMPLANT
SPONGE LAP 4X18 X RAY DECT (DISPOSABLE) IMPLANT
SPONGE SURGIFOAM ABS GEL SZ50 (HEMOSTASIS) ×3 IMPLANT
STAPLER SKIN PROX WIDE 3.9 (STAPLE) ×3 IMPLANT
STIMULATOR CORD SURESCAN MRI (Stimulator) ×2 IMPLANT
STIMULATOR SURESCAN SPINAL (Neurostimulator) ×2 IMPLANT
STRIP CLOSURE SKIN 1/2X4 (GAUZE/BANDAGES/DRESSINGS) IMPLANT
SUT ETHILON 3 0 PS 1 (SUTURE) ×6 IMPLANT
SUT SILK 0 TIES 10X30 (SUTURE) IMPLANT
SUT SILK 2 0 FS (SUTURE) ×6 IMPLANT
SUT SILK 2 0 TIES 10X30 (SUTURE) IMPLANT
SUT VIC AB 0 CT1 18XCR BRD8 (SUTURE) ×1 IMPLANT
SUT VIC AB 0 CT1 8-18 (SUTURE) ×3
SUT VIC AB 2-0 CP2 18 (SUTURE) ×3 IMPLANT
SUT VIC AB 3-0 SH 8-18 (SUTURE) ×5 IMPLANT
SYR CONTROL 10ML LL (SYRINGE) ×3 IMPLANT
SYSTEM RECHARG SURESCAN SPINE (Neurostimulator) ×2 IMPLANT
TOWEL OR 17X24 6PK STRL BLUE (TOWEL DISPOSABLE) ×3 IMPLANT
TOWEL OR 17X26 10 PK STRL BLUE (TOWEL DISPOSABLE) ×3 IMPLANT
WATER STERILE IRR 1000ML POUR (IV SOLUTION) ×3 IMPLANT

## 2015-04-09 NOTE — Progress Notes (Signed)
Pt c/o some slight back discomfort that feel like it comes through chest. BP is elevated otherwise vss. Dr. Tamala Julian notified. Orders received to treat HTN with Labetalol prn and no further orders. Will cont to monitor.

## 2015-04-09 NOTE — Progress Notes (Signed)
Report to Jamie RN.

## 2015-04-09 NOTE — Progress Notes (Signed)
Pt cont to have elevated BP. Dr.Smith notified and to bedside and convinces pt to accept Labetalol for BP. Will give and cont to monitor.

## 2015-04-09 NOTE — Op Note (Signed)
Date of surgery: 04/09/2015 Preoperative diagnosis: Expired spinal cord stimulator Postoperative diagnosis: Expired spinal cord stimulator Procedure: Replacement of spinal scored stimulator with replacement of paddle electrode to improved 5 x 7 electrode. Surgeon: Kristeen Miss Anesthesia: Gen. endotracheal Indications: Rhea Bleacher grills a 64 year old individual's had significant back and leg pain in addition to shoulder neck and arm pain she has had a spinal cord stimulator placed about 11 years ago and she had reasonable relief with this but she is now having failure of the unit has is expired its battery life. She is now to have a new spinal cord stimulator placed and her electrode will also be exchanged for new or 5 x 7 paddle electrode to give greater coverage.  Procedure: Patient was brought to the operating supine on a stretcher after the smooth induction of general endotracheal anesthesia she was turned prone the back was prepped with alcohol DuraPrep and draped in a sterile fashion to expose the 2 previous incisions 1 over the right posterior superior iliac crest other in the midline of thoracic spine on the lower aspect of it. The first incision was opened over the posterior superior iliac crest after infiltrating with 5 mL of lidocaine mixed 50-50 with half percent Marcaine and 1-200,000 epinephrine. Another 5 mL was injected into the thoracic incision area dissection was carried down to subcutaneous needs tissue in the pocket around the previous battery stimulator generator was opened. The coiled wires were gently dissected. The wires were then disconnected from the generator. The generator was removed. The thoracic incision was then opened in the subcutaneous tissues the connector between the electrode and the distal wire was opened and this was removed separately. The electrodes were then traced down to the laminotomy site between T9-T10. The laminotomy was enlarged to allow removal of the previously  placed paddle electrode. Then by gently dissecting with a paddle electrode placement Trenton Gammon it was noted that the superior aspect of the epidural pocket was firmly attached at this point it was decided to extend the incision and open a laminotomy at the T8-T9 level. A laminotomy was created in this region and the superior portion of the laminar arch of T9 was removed to allow visualization of the pouch in this region then the thin fascial tissue was dissected open and this allowed passage of the trial passing stool across the vertebral body of T9 all the way up under T8. The firm adhesions to the dura were dissected free in this region. Then a 5 x 7 paddle electrode was placed across T8 and its location was checked both visually by identifying its position in the laminotomy site and also radiographically. The distal wires were then tunneled through a new separate tunnel to the posterior superior iliac crest incision. The system was then connected to the new generator. This placed in the subcutaneous pouch and the generator wires were coiled underneath the generator itself. It was tested percutaneously and found to be in working order. A singular stitch was then placed in the generator to anchor it superiorly in the region of the posterior superior iliac crest. With this the wound was irrigated copiously and the wounds were then closed with 0 Vicryl in the rectal dorsal fascia 2-0 Vicryl in the subcutaneous tissues in both incisions and 3-0 Vicryl subcuticularly. Dermabond was placed on the skin blood loss was less than 10 mL for the entire procedure. Patient tolerated procedure well is returned to recovery room in stable condition. The device that was placed was a restore  sensor sure scan model [id number] serial number and am [id number] the generator was known as specified sure scan MRI 5-6-5. The lot number of the catheter was the at a [id number].

## 2015-04-09 NOTE — Anesthesia Preprocedure Evaluation (Addendum)
Anesthesia Evaluation  Patient identified by MRN, date of birth, ID band Patient awake    Reviewed: Allergy & Precautions, NPO status , Patient's Chart, lab work & pertinent test results  History of Anesthesia Complications (+) PONV  Airway Mallampati: I  TM Distance: >3 FB     Dental   Pulmonary asthma , sleep apnea , COPD   Pulmonary exam normal       Cardiovascular hypertension, Rhythm:Regular Rate:Normal     Neuro/Psych  Headaches,  Neuromuscular disease    GI/Hepatic hiatal hernia, GERD-  ,(+) Hepatitis -, B  Endo/Other  Hypothyroidism Morbid obesity  Renal/GU Renal disease     Musculoskeletal  (+) Arthritis -, Fibromyalgia -  Abdominal   Peds  Hematology  (+) anemia ,   Anesthesia Other Findings Breast CA Addison's  Reproductive/Obstetrics                            Anesthesia Physical Anesthesia Plan  ASA: III  Anesthesia Plan: General   Post-op Pain Management:    Induction: Intravenous  Airway Management Planned: Oral ETT  Additional Equipment:   Intra-op Plan:   Post-operative Plan: Extubation in OR  Informed Consent: I have reviewed the patients History and Physical, chart, labs and discussed the procedure including the risks, benefits and alternatives for the proposed anesthesia with the patient or authorized representative who has indicated his/her understanding and acceptance.     Plan Discussed with: Anesthesiologist, CRNA and Surgeon  Anesthesia Plan Comments:         Anesthesia Quick Evaluation

## 2015-04-09 NOTE — H&P (Signed)
Daisy [name] is an 64 y.o. female.   Chief Complaint: Chronic pain with nonfunctional spinal cord stimulator HPI: Patient is a 64 year old individual who's had chronic pain for a long number of years she has advanced spondylosis of the lumbar spine having had extensive surgery there she had a spinal cord stimulator that had placed about 10 years ago and she had good relief with the use of the spinal cord stimulator until recently when it seems that the battery has reached its end of life cycle and she has been having increasing problems with pain from the thoracic spine down to the lower extremities. She is now being admitted to undergo revision of the spinal cord stimulator with placement of a new generator and also placement of a new paddle electrode.  Past Medical History  Diagnosis Date  . Asthma   . Hypertension   . Shingles   . Sleep apnea   . OSA on CPAP   . Hypothyroidism   . H/O esophageal spasm     "I can have as many as 36 TUMS before they stop"  . Radiation adverse effect     "100% blockage on left; 50% on right salivary; can drain w/massage"  . Restless leg syndrome, controlled 02/19/12    "much better since being on CPAP; been on that ~ 1 month now"  . COPD (chronic obstructive pulmonary disease)   . Pneumonia     "I've had it several times"  . Chronic bronchitis   . Positive TB test     "ran deep in my father's family; went on INH for awhile; it caused migraines"  . Adrenal insufficiency     "w/secondary Addison's"  . Anemia   . GERD (gastroesophageal reflux disease)   . H/O hiatal hernia   . Upper GI bleeding 1970-1980    "had 4"  . Hepatitis B infection 1971    "in high school; after drinking after football player"  . Migraines     "much better now" (02/19/12)  . Headache(784.0)     "when I don't eat" (02/19/12)  . Anxiety   . Arthritis   . Fibromyalgia   . Multiple sclerosis 05/1988    "dr's in [place] don't think I have it; dr's in [place] still do"  (02/19/12);  VERITGO, FALLS, DIFFICULTY WALKING, URINARY URGENCY, BLURRED VISION--DR. CARMEN DOHMEIER NEUROLOGIST- HAS NOT TOLERATED USUAL MEDS TO TREAT MS AND NOT ON ANY MS MEDS AT PRESENT  . On home oxygen therapy     04/06/15 pt reports was using 2L at night with CPAP but has stopped using CPAP  . Bruises easily   . Stress incontinence in female   . Addison's disease   . Fracture     LUMBAR 1 - FROM A FALL JUNE 2015 - PT HAS A LOT OF SEVERE BACK PAIN AND DIFFICULTY WALKING  . Falls frequently   . History of urinary urgency     AZO HELPS - DOES TURN URINE ORANGE; DR. Risa Sainvil IS PT'S UROLOGIST  . Chronic pain     DR. MARK PHILLIPS WITH PAIN CLINIC -  CHRONIC BACK PAIN AND SCOLIOSIS AND PREVIOUS BACK SURGERY AND NOW LUMBAR FRACTURE - PT HAS [employer] SPINAL STIMULATOR WHICH SHE CAN TURN ON AND OFF ; PT ON MORPINE AND CLONAZEPAM AND ROBAXIN  . Rash     PT HAS ADULT ACNEA FLAREUPS - RED RASH NOW ON RT FACE   . Adenocarcinoma of breast 07/13/1992    LEFT LUMPECTOMY AND CHEMO AND  RADIATION;   NOW HAS RIGHT BREAST MASS 09/25/14 - PLANS MASTECTOMY   . Poor venous access     PT STATES HER PORT A CATH NOT TO BE USED FOR SURGERY/ IV'S  . Kidney stones     previous lithotripsy rt 30 + yrs ago - has bilateral stones now.  Marland Kitchen PONV (postoperative nausea and vomiting)     one episode 1993 vomiting after surgery  . Shortness of breath dyspnea     with exertion  . Brittle bone disease   . Paget's disease of breast 2010    left    Past Surgical History  Procedure Laterality Date  . Tonsillectomy and adenoidectomy  1964  . Mastectomy  2010    left  . Appendectomy  1960's  . Cholecystectomy  1980's  . Abdominal hysterectomy  1987  . Bilateral oophorectomy  1988  . Breast lumpectomy  07/13/1992    left  . Spinal cord stimulator insertion  ~ 2004  . Back surgery  ~ 2004    "chipped disk out to put in permanent stimulator"  . Band hemorrhoidectomy  1979  . Salivary gland surgery  ~ 2002    "left was  100% blocked twice; right 50%; only had one surgery"  . Laparoscopy    . Portacath placement  10/30/2012    Procedure: INSERTION PORT-A-CATH;  Surgeon: Odis Hollingshead, MD;  Location: [facility];  Service: General;  Laterality: Right;  Right Ultrasound guided port-a-cath insertion  . Esophagogastroduodenoscopy N/A 04/17/2014    Procedure: ESOPHAGOGASTRODUODENOSCOPY (EGD);  Surgeon: Missy Sabins, MD;  Location: [facility];  Service: Endoscopy;  Laterality: N/A;  . Savory dilation N/A 04/17/2014    Procedure: SAVORY DILATION;  Surgeon: Missy Sabins, MD;  Location: [facility];  Service: Endoscopy;  Laterality: N/A;  . Salivary gland surgery left    . Total mastectomy Right 09/25/2014    Procedure: RIGHT MASTECTOMY;  Surgeon: Jackolyn Confer, MD;  Location: [facility];  Service: General;  Laterality: Right;  . Mastectomy Right 10/15    Family History  Problem Relation Age of Onset  . Lung cancer Paternal Uncle     smoker  . Breast cancer Cousin 70    maternal cousin  . Throat cancer Cousin     maternal cousin   Social History:  reports that she has never smoked. She has never used smokeless tobacco. She reports that she does not drink alcohol or use illicit drugs.  Allergies:  Allergies  Allergen Reactions  . Augmentin [Amoxicillin-Pot Clavulanate] Other (See Comments)    Bad headaches  . Compazine Other (See Comments)    Hyper and shaky  . Iohexol      Desc: ANAPHYLACTIC REACTION!-kmr-10/16/05   . Ivp Dye [Iodinated Diagnostic Agents] Anaphylaxis and Other (See Comments)    Fever and blood clot  . Lisinopril Cough  . Metformin And Related     Migraine   . Metoclopramide Hcl Other (See Comments)    Hyper, shaky  . Promethazine Hcl Other (See Comments)    Hyper,,,,shaky  . Sulfa Antibiotics Swelling    Hallucinate   . Terbinafine Hcl Other (See Comments)    Throat closed up.  . Vancomycin Swelling  . Guaifenesin Nausea And Vomiting, Swelling and Other (See Comments)    hyper   . Ambien [Zolpidem]     shaky  . Amlodipine Swelling  . Biaxin [Clarithromycin]     jittery  . Clindamycin/Lincomycin     Can not take  .  Dilaudid [Hydromorphone Hcl] Nausea And Vomiting    Headaches   . Keflex [Cephalexin]     Can not take  . Other     SOME TAPES CAUSE SKIN IRRITATION    Medications Prior to Admission  Medication Sig Dispense Refill  . b complex vitamins tablet Take 1 tablet by mouth every morning.    . budesonide-formoterol (SYMBICORT) 160-4.5 MCG/ACT inhaler Inhale 2 puffs into the lungs 2 (two) times daily as needed (for asthma).     . Calcium-Vitamin D-Vitamin K (VIACTIV) [phone] MG-UNT-MCG CHEW Chew 3 each by mouth daily at 12 noon.     . clonazePAM (KLONOPIN) 1 MG tablet Take 1 mg by mouth 3 (three) times daily.     Marland Kitchen esomeprazole (NEXIUM) 20 MG capsule Take 20 mg by mouth 2 (two) times daily before a meal.    . hydrOXYzine (ATARAX/VISTARIL) 50 MG tablet Take 50-100 mg by mouth 4 (four) times daily as needed for itching (allergies). PT STATES SHE ALWAYS TAKE 4 TIMES A  DAY    . levalbuterol (XOPENEX HFA) 45 MCG/ACT inhaler Inhale 2 puffs into the lungs every 4 (four) hours as needed for shortness of breath.     . levothyroxine (SYNTHROID, LEVOTHROID) 50 MCG tablet Take 50 mcg by mouth every other day.    . levothyroxine (SYNTHROID, LEVOTHROID) 75 MCG tablet Take 75 mcg by mouth every other day.     . methocarbamol (ROBAXIN) 500 MG tablet Take 500 mg by mouth 4 (four) times daily.    . metoprolol tartrate (LOPRESSOR) 25 MG tablet Take 12.5 mg by mouth 2 (two) times daily. 12.5 in the morning and if blood pressure is high in the afternoon she will take another half tab    . morphine (MSIR) 30 MG tablet Take 30 mg by mouth 2 (two) times daily. TAKES AT 1 AM AND 1 PM    . nystatin (MYCOSTATIN/NYSTOP) 100000 UNIT/GM POWD Apply 1 g topically 2 (two) times daily. 30 g 1  . omeprazole (PRILOSEC) 40 MG capsule Take 40 mg by mouth 2 (two) times daily.  2  .  predniSONE (DELTASONE) 1 MG tablet Take 4 mg by mouth daily with breakfast.    . senna (SENOKOT) 8.6 MG TABS tablet Take 1 tablet by mouth daily as needed for mild constipation.     . tacrolimus (PROTOPIC) 0.1 % ointment Apply 1 application topically daily.    Marland Kitchen levalbuterol (XOPENEX) 1.25 MG/0.5ML nebulizer solution Take 1.25 mg by nebulization every 4 (four) hours as needed for wheezing or shortness of breath.    . losartan (COZAAR) 50 MG tablet Take 50 mg by mouth daily as needed.    . mupirocin ointment (BACTROBAN) 2 % Apply 1 application topically 3 (three) times daily as needed (affected area).    . ondansetron (ZOFRAN ODT) 4 MG disintegrating tablet Take 1 tablet (4 mg total) by mouth every 6 (six) hours as needed for nausea or vomiting. 15 tablet 0  . oxyCODONE (OXY IR/ROXICODONE) 5 MG immediate release tablet Take 1-2 tablets (5-10 mg total) by mouth every 4 (four) hours as needed for moderate pain, severe pain or breakthrough pain. (Patient not taking: Reported on 04/06/2015) 40 tablet 0  . Phenazopyridine HCl (AZO TABS PO) Take 2 tablets by mouth 3 (three) times daily as needed.      No results found for this or any previous visit (from the past 48 hour(s)). No results found.  Review of Systems  Constitutional: Positive for weight loss  and malaise/fatigue.  HENT: Negative.   Eyes: Negative.   Respiratory: Negative.   Cardiovascular: Negative.   Gastrointestinal: Negative.   Genitourinary: Negative.   Musculoskeletal:       Chronic pain back neck shoulders arms and extremities  Skin: Negative.   Neurological:       Chronic pain in back and neck shoulders and arms lower extremities  Psychiatric/Behavioral: Positive for depression. The patient is nervous/anxious.     Blood pressure 123/46, pulse 77, temperature 99 F (37.2 C), temperature source Oral, resp. rate 20, height 5' (1.524 m), weight 84.1 kg (185 lb 6.5 oz), SpO2 95 %. Physical Exam  Constitutional: She is oriented to  person, place, and time. She appears well-developed and well-nourished.  HENT:  Head: Normocephalic and atraumatic.  Eyes: Conjunctivae are normal. Pupils are equal, round, and reactive to light.  Neck: Neck supple.  Cardiovascular: Normal rate and regular rhythm.   Respiratory: Effort normal and breath sounds normal.  GI: Soft. Bowel sounds are normal.  Musculoskeletal:  Chronic pain over greater areas of the shoulders hips knees thighs to palpation and percussion  Neurological: She is alert and oriented to person, place, and time.  Cranial nerve exam is intact upper and lower extremities have absent reflexes since reflexes equivocal bilaterally. Motor function appears intact in the major groups though movement is slow. Cerebellar testing is negative  Skin: Skin is warm and dry.  Psychiatric: She has a normal mood and affect. Her behavior is normal. Judgment and thought content normal.     Assessment/Plan Expired spinal cord stimulator.  Placement of spinal cord stimulator and placement of new paddle electrode in the thoracic epidural spine  Daisy [name] J 04/09/2015, 12:20 PM

## 2015-04-09 NOTE — Anesthesia Procedure Notes (Signed)
Procedure Name: Intubation Date/Time: 04/09/2015 12:29 PM Performed by: Shirlyn Goltz Pre-anesthesia Checklist: Patient identified, Emergency Drugs available, Suction available and Patient being monitored Patient Re-evaluated:Patient Re-evaluated prior to inductionOxygen Delivery Method: Circle system utilized Preoxygenation: Pre-oxygenation with 100% oxygen Intubation Type: IV induction Ventilation: Mask ventilation without difficulty Laryngoscope Size: Mac and 4 (by vernon banks crna) Grade View: Grade I Tube type: Oral Tube size: 7.0 mm Number of attempts: 1 Airway Equipment and Method: Stylet Placement Confirmation: ETT inserted through vocal cords under direct vision,  positive ETCO2 and breath sounds checked- equal and bilateral Secured at: 22 cm Tube secured with: Tape Dental Injury: Teeth and Oropharynx as per pre-operative assessment

## 2015-04-09 NOTE — Transfer of Care (Signed)
Immediate Anesthesia Transfer of Care Note  Patient: Daisy [name]  Procedure(s) Performed: Procedure(s) with comments: Spinal cord stimulator revision and replacement of paddle electrode (N/A) - Spinal cord stimulator revision and replacement of paddle electrode  Patient Location: PACU  Anesthesia Type:General  Level of Consciousness: awake, alert , oriented and patient cooperative  Airway & Oxygen Therapy: Patient Spontanous Breathing and Patient connected to nasal cannula oxygen  Post-op Assessment: Report given to RN, Post -op Vital signs reviewed and stable, Patient moving all extremities, Patient moving all extremities X 4 and Patient able to stick tongue midline  Post vital signs: Reviewed and stable  Last Vitals:  Filed Vitals:   04/09/15 0933  BP: 123/46  Pulse: 77  Temp: 37.2 C  Resp: 20    Complications: No apparent anesthesia complications

## 2015-04-09 NOTE — Anesthesia Postprocedure Evaluation (Signed)
  Anesthesia Post-op Note  Patient: Daisy [name]  Procedure(s) Performed: Procedure(s) with comments: Spinal cord stimulator revision and replacement of paddle electrode (N/A) - Spinal cord stimulator revision and replacement of paddle electrode  Patient Location: PACU  Anesthesia Type:General  Level of Consciousness: awake, alert , oriented and patient cooperative  Airway and Oxygen Therapy: Patient Spontanous Breathing  Post-op Pain: mild  Post-op Assessment: Post-op Vital signs reviewed, Patient's Cardiovascular Status Stable, Respiratory Function Stable, Patent Airway, No signs of Nausea or vomiting and Pain level controlled  Post-op Vital Signs: stable  Last Vitals:  Filed Vitals:   04/09/15 1645  BP:   Pulse: 76  Temp:   Resp:     Complications: No apparent anesthesia complications

## 2015-04-09 NOTE — Progress Notes (Signed)
Pt arrived to 4N21 at current time.  Pt A&O x 4, c/o 5/10 mid and lower back surgical pain, site covered with CDI dermabond dressing, no drains.   Pt V/S taken, pt has since voided and ambulated into the bathroom with 1 assist.  Pt without distress.  Family at the bedside. Diet ordered, will monitor.

## 2015-04-09 NOTE — Progress Notes (Signed)
Pt refused Labetalol at this time

## 2015-04-09 NOTE — Progress Notes (Signed)
Receiving report from [facility]

## 2015-04-10 DIAGNOSIS — T8589XA Other specified complication of internal prosthetic devices, implants and grafts, not elsewhere classified, initial encounter: Secondary | ICD-10-CM | POA: Diagnosis not present

## 2015-04-10 MED ORDER — OXYCODONE-ACETAMINOPHEN 5-325 MG PO TABS: 1.0000 | ORAL_TABLET | ORAL | Status: DC | PRN

## 2015-04-10 NOTE — Discharge Summary (Signed)
Physician Discharge Summary  Patient ID: Daisy [name] MRN: [id number] DOB/AGE: 1951/01/09 64 y.o.  Admit date: 04/09/2015 Discharge date: 04/10/2015  Admission Diagnoses: Expired spinal cord stimulator  Discharge Diagnoses: The same Active Problems:   Spinal cord stimulator dysfunction   Discharged Condition: fair  Hospital Course: Dr. Ellene Route performed a revision of the patient's spinal cord stimulator on 04/09/2015.  The patient's postoperative course was unremarkable. On postoperative day #1 the patient requested discharge to home. She was given oral and written discharge instructions. All her questions were answered.  Consults: None Significant Diagnostic Studies: None Treatments: Revision of spinal cord stimulator Discharge Exam: Blood pressure 161/73, pulse 84, temperature 100.3 F (37.9 C), temperature source Oral, resp. rate 18, height 5' (1.524 m), weight 84.1 kg (185 lb 6.5 oz), SpO2 98 %. The patient is alert and pleasant. She is moving her lower extremities well. Her wounds are healing well without drainage.  Disposition: Home  Discharge Instructions    Call MD for:  difficulty breathing, headache or visual disturbances    Complete by:  As directed      Call MD for:  extreme fatigue    Complete by:  As directed      Call MD for:  hives    Complete by:  As directed      Call MD for:  persistant dizziness or light-headedness    Complete by:  As directed      Call MD for:  persistant nausea and vomiting    Complete by:  As directed      Call MD for:  redness, tenderness, or signs of infection (pain, swelling, redness, odor or green/yellow discharge around incision site)    Complete by:  As directed      Call MD for:  severe uncontrolled pain    Complete by:  As directed      Call MD for:  temperature >100.4    Complete by:  As directed      Diet - low sodium heart healthy    Complete by:  As directed      Discharge instructions    Complete by:  As directed   Call [phone] for a followup appointment. Take a stool softener while you are using pain medications.     Driving Restrictions    Complete by:  As directed   Do not drive for 2 weeks.     Increase activity slowly    Complete by:  As directed      Lifting restrictions    Complete by:  As directed   Do not lift more than 5 pounds. No excessive bending or twisting.     May shower / Bathe    Complete by:  As directed   He may shower after the pain she is removed 3 days after surgery. Leave the incision alone.     No dressing needed    Complete by:  As directed             Medication List    STOP taking these medications        mupirocin ointment 2 %  Commonly known as:  BACTROBAN      TAKE these medications        AZO TABS PO  Take 2 tablets by mouth 3 (three) times daily as needed.     b complex vitamins tablet  Take 1 tablet by mouth every morning.     budesonide-formoterol 160-4.5 MCG/ACT inhaler  Commonly known as:  SYMBICORT  Inhale 2 puffs into the lungs 2 (two) times daily as needed (for asthma).     clonazePAM 1 MG tablet  Commonly known as:  KLONOPIN  Take 1 mg by mouth 3 (three) times daily.     esomeprazole 20 MG capsule  Commonly known as:  NEXIUM  Take 20 mg by mouth 2 (two) times daily before a meal.     hydrOXYzine 50 MG tablet  Commonly known as:  ATARAX/VISTARIL  Take 50-100 mg by mouth 4 (four) times daily as needed for itching (allergies). PT STATES SHE ALWAYS TAKE 4 TIMES A  DAY     levalbuterol 1.25 MG/0.5ML nebulizer solution  Commonly known as:  XOPENEX  Take 1.25 mg by nebulization every 4 (four) hours as needed for wheezing or shortness of breath.     levalbuterol 45 MCG/ACT inhaler  Commonly known as:  XOPENEX HFA  Inhale 2 puffs into the lungs every 4 (four) hours as needed for shortness of breath.     levothyroxine 50 MCG tablet  Commonly known as:  SYNTHROID, LEVOTHROID  Take 50 mcg by mouth every other day.     levothyroxine  75 MCG tablet  Commonly known as:  SYNTHROID, LEVOTHROID  Take 75 mcg by mouth every other day.     losartan 50 MG tablet  Commonly known as:  COZAAR  Take 50 mg by mouth daily as needed.     methocarbamol 500 MG tablet  Commonly known as:  ROBAXIN  Take 500 mg by mouth 4 (four) times daily.     metoprolol tartrate 25 MG tablet  Commonly known as:  LOPRESSOR  Take 12.5 mg by mouth 2 (two) times daily. 12.5 in the morning and if blood pressure is high in the afternoon she will take another half tab     morphine 30 MG tablet  Commonly known as:  MSIR  Take 30 mg by mouth 2 (two) times daily. TAKES AT 1 AM AND 1 PM     nystatin 100000 UNIT/GM Powd  Apply 1 g topically 2 (two) times daily.     omeprazole 40 MG capsule  Commonly known as:  PRILOSEC  Take 40 mg by mouth 2 (two) times daily.     ondansetron 4 MG disintegrating tablet  Commonly known as:  ZOFRAN ODT  Take 1 tablet (4 mg total) by mouth every 6 (six) hours as needed for nausea or vomiting.     oxyCODONE 5 MG immediate release tablet  Commonly known as:  Oxy IR/ROXICODONE  Take 1-2 tablets (5-10 mg total) by mouth every 4 (four) hours as needed for moderate pain, severe pain or breakthrough pain.     oxyCODONE-acetaminophen 5-325 MG per tablet  Commonly known as:  PERCOCET/ROXICET  Take 1-2 tablets by mouth every 4 (four) hours as needed for moderate pain.     predniSONE 1 MG tablet  Commonly known as:  DELTASONE  Take 4 mg by mouth daily with breakfast.     senna 8.6 MG Tabs tablet  Commonly known as:  SENOKOT  Take 1 tablet by mouth daily as needed for mild constipation.     tacrolimus 0.1 % ointment  Commonly known as:  PROTOPIC  Apply 1 application topically daily.     VIACTIV [phone] MG-UNT-MCG Chew  Generic drug:  Calcium-Vitamin D-Vitamin K  Chew 3 each by mouth daily at 12 noon.         SignedNewman Pies D 04/10/2015, 10:14 AM

## 2015-04-10 NOTE — Progress Notes (Signed)
Met with patient and   Spouse to review discharge instructions. One rx given, advised of the importance of adhering to medication regimen. Demonstrated understanding. Pt was escorted to lobby by nurse tech at this time and transported home by husband.  

## 2015-04-12 ENCOUNTER — Telehealth: Payer: Self-pay | Admitting: Oncology

## 2015-04-12 NOTE — Telephone Encounter (Signed)
s.w. pt and advised on cx and r/s appt....pt ok and aware

## 2015-04-13 ENCOUNTER — Encounter (HOSPITAL_COMMUNITY): Payer: Self-pay | Admitting: Neurological Surgery

## 2015-04-22 ENCOUNTER — Ambulatory Visit (HOSPITAL_BASED_OUTPATIENT_CLINIC_OR_DEPARTMENT_OTHER): Payer: BLUE CROSS/BLUE SHIELD

## 2015-04-22 VITALS — BP 128/52 | HR 81 | Temp 98.4°F

## 2015-04-22 DIAGNOSIS — Z452 Encounter for adjustment and management of vascular access device: Secondary | ICD-10-CM

## 2015-04-22 DIAGNOSIS — Z853 Personal history of malignant neoplasm of breast: Secondary | ICD-10-CM

## 2015-04-22 DIAGNOSIS — Z95828 Presence of other vascular implants and grafts: Secondary | ICD-10-CM

## 2015-04-22 MED ORDER — HEPARIN SOD (PORK) LOCK FLUSH 100 UNIT/ML IV SOLN
500.0000 [IU] | Freq: Once | INTRAVENOUS | Status: AC
  Administered 2015-04-22: 500 [IU] via INTRAVENOUS
  Filled 2015-04-22: qty 5

## 2015-04-22 MED ORDER — SODIUM CHLORIDE 0.9 % IJ SOLN
10.0000 mL | INTRAMUSCULAR | Status: DC | PRN
  Administered 2015-04-22: 10 mL via INTRAVENOUS
  Filled 2015-04-22: qty 10

## 2015-04-22 NOTE — Patient Instructions (Signed)

## 2015-04-23 ENCOUNTER — Encounter (HOSPITAL_COMMUNITY): Payer: Self-pay | Admitting: Neurological Surgery

## 2015-05-21 ENCOUNTER — Telehealth: Payer: Self-pay | Admitting: *Deleted

## 2015-05-21 NOTE — Telephone Encounter (Signed)
Re triage note Reclast: Please tell her that Reclast certainly can be a very useful medicine. She should talk with PCP or gastroenterologist if she has concerns before starting it. thanks

## 2015-05-21 NOTE — Telephone Encounter (Signed)
PT.'S ENDOCRINOLOGIST,DR.KERR, WOULD LIKE TO START PT. ON RECLAST FOR HER BONES. PT. IS CONCERNED ABOUT ANY MAJOR PROBLEMS THAT COULD OCCUR FROM TAKING THIS MEDICATION. PT ALSO WAS WONDERING IF SHE SHOULD WAIT UNTIL THE ISSUES WITH HER ESOPHAGUS IMPROVE BEFORE STARTING THE RECLAST. PT. DOES NOT WANT DR.LIVESAY TO CONTACT DR.KERR. SHE REALLY WOULD VALUE DR.LIVESAY'S THOUGHTS ON THIS MATTER.

## 2015-05-24 NOTE — Telephone Encounter (Signed)
NOTIFIED PT. OF DR.LIVESAY'S RESPONSE.

## 2015-06-09 ENCOUNTER — Telehealth: Payer: Self-pay | Admitting: Oncology

## 2015-06-09 NOTE — Telephone Encounter (Signed)
Patient called in to cancel and reschedule her flush

## 2015-06-22 ENCOUNTER — Ambulatory Visit (HOSPITAL_BASED_OUTPATIENT_CLINIC_OR_DEPARTMENT_OTHER): Payer: BLUE CROSS/BLUE SHIELD

## 2015-06-22 VITALS — BP 141/71 | HR 80 | Temp 98.9°F

## 2015-06-22 DIAGNOSIS — Z452 Encounter for adjustment and management of vascular access device: Secondary | ICD-10-CM | POA: Diagnosis not present

## 2015-06-22 DIAGNOSIS — Z853 Personal history of malignant neoplasm of breast: Secondary | ICD-10-CM

## 2015-06-22 DIAGNOSIS — Z95828 Presence of other vascular implants and grafts: Secondary | ICD-10-CM

## 2015-06-22 MED ORDER — HEPARIN SOD (PORK) LOCK FLUSH 100 UNIT/ML IV SOLN
500.0000 [IU] | Freq: Once | INTRAVENOUS | Status: AC
  Administered 2015-06-22: 500 [IU] via INTRAVENOUS
  Filled 2015-06-22: qty 5

## 2015-06-22 MED ORDER — SODIUM CHLORIDE 0.9 % IJ SOLN
10.0000 mL | INTRAMUSCULAR | Status: DC | PRN
  Administered 2015-06-22: 10 mL via INTRAVENOUS
  Filled 2015-06-22: qty 10

## 2015-06-22 NOTE — Patient Instructions (Signed)

## 2015-08-06 ENCOUNTER — Telehealth: Payer: Self-pay | Admitting: *Deleted

## 2015-08-06 NOTE — Telephone Encounter (Signed)
ANDI AT DR.KERR'S OFFICE HAS SOME QUESTIONS CONCERNING THE USE OF PT.'S PORT TO RECEIVE THE RECLAST.

## 2015-08-06 NOTE — Telephone Encounter (Signed)
Spoke with Andi at Dr. Cindra Eves office. Reclast can ben given via Ms. Deroos's PAC at Short stay at [facility].  The nurses on the unit or IV team can access the porta-cath. The Porta-cath needs to be flushed with 10 cc normal saline and then 500 units of heparin prior to needle being removed.  Andi appreciated the return call.

## 2015-08-18 ENCOUNTER — Ambulatory Visit (HOSPITAL_COMMUNITY)
Admission: RE | Admit: 2015-08-18 | Discharge: 2015-08-18 | Disposition: A | Discharge status: Home / Self Care | Payer: BLUE CROSS/BLUE SHIELD | Source: Ambulatory Visit | Attending: Internal Medicine | Admitting: Internal Medicine

## 2015-08-23 ENCOUNTER — Ambulatory Visit (HOSPITAL_BASED_OUTPATIENT_CLINIC_OR_DEPARTMENT_OTHER): Payer: BLUE CROSS/BLUE SHIELD

## 2015-08-23 ENCOUNTER — Other Ambulatory Visit (HOSPITAL_BASED_OUTPATIENT_CLINIC_OR_DEPARTMENT_OTHER): Payer: BLUE CROSS/BLUE SHIELD

## 2015-08-23 DIAGNOSIS — Z853 Personal history of malignant neoplasm of breast: Secondary | ICD-10-CM

## 2015-08-23 DIAGNOSIS — D0510 Intraductal carcinoma in situ of unspecified breast: Secondary | ICD-10-CM

## 2015-08-23 DIAGNOSIS — Z95828 Presence of other vascular implants and grafts: Secondary | ICD-10-CM

## 2015-08-23 LAB — CBC WITH DIFFERENTIAL/PLATELET
BASO%: 0.6 % (ref 0.0–2.0)
BASOS ABS: 0 10*3/uL (ref 0.0–0.1)
EOS ABS: 0.2 10*3/uL (ref 0.0–0.5)
EOS%: 2.5 % (ref 0.0–7.0)
HEMATOCRIT: 36.1 % (ref 34.8–46.6)
HEMOGLOBIN: 11.9 g/dL (ref 11.6–15.9)
LYMPH#: 2.4 10*3/uL (ref 0.9–3.3)
LYMPH%: 35.3 % (ref 14.0–49.7)
MCH: 26.9 pg (ref 25.1–34.0)
MCHC: 32.9 g/dL (ref 31.5–36.0)
MCV: 81.6 fL (ref 79.5–101.0)
MONO#: 0.5 10*3/uL (ref 0.1–0.9)
MONO%: 7.3 % (ref 0.0–14.0)
NEUT%: 54.3 % (ref 38.4–76.8)
NEUTROS ABS: 3.7 10*3/uL (ref 1.5–6.5)
Platelets: 221 10*3/uL (ref 145–400)
RBC: 4.43 10*6/uL (ref 3.70–5.45)
RDW: 17.4 % — AB (ref 11.2–14.5)
WBC: 6.8 10*3/uL (ref 3.9–10.3)

## 2015-08-23 LAB — COMPREHENSIVE METABOLIC PANEL (CC13)
ALBUMIN: 3.2 g/dL — AB (ref 3.5–5.0)
ALK PHOS: 141 U/L (ref 40–150)
ALT: 131 U/L — ABNORMAL HIGH (ref 0–55)
AST: 68 U/L — AB (ref 5–34)
Anion Gap: 7 mEq/L (ref 3–11)
BILIRUBIN TOTAL: 0.93 mg/dL (ref 0.20–1.20)
BUN: 12 mg/dL (ref 7.0–26.0)
CALCIUM: 8.5 mg/dL (ref 8.4–10.4)
CO2: 31 mEq/L — ABNORMAL HIGH (ref 22–29)
CREATININE: 0.7 mg/dL (ref 0.6–1.1)
Chloride: 105 mEq/L (ref 98–109)
EGFR: >90 mL/min/{1.73_m2} (ref >90)
GLUCOSE: 88 mg/dL (ref 70–140)
Potassium: 3.9 mEq/L (ref 3.5–5.1)
SODIUM: 143 meq/L (ref 136–145)
TOTAL PROTEIN: 6.3 g/dL — AB (ref 6.4–8.3)

## 2015-08-23 MED ORDER — HEPARIN SOD (PORK) LOCK FLUSH 100 UNIT/ML IV SOLN
500.0000 [IU] | Freq: Once | INTRAVENOUS | Status: AC
  Administered 2015-08-23: 500 [IU] via INTRAVENOUS
  Filled 2015-08-23: qty 5

## 2015-08-23 MED ORDER — SODIUM CHLORIDE 0.9 % IJ SOLN
10.0000 mL | INTRAMUSCULAR | Status: DC | PRN
  Administered 2015-08-23: 10 mL via INTRAVENOUS
  Filled 2015-08-23: qty 10

## 2015-08-23 NOTE — Patient Instructions (Signed)

## 2015-08-24 ENCOUNTER — Other Ambulatory Visit: Payer: Self-pay | Admitting: Oncology

## 2015-08-24 ENCOUNTER — Telehealth: Payer: Self-pay

## 2015-08-24 NOTE — Telephone Encounter (Addendum)
Daisy [name] says that her kidney stone has moved and needs to be surgically removed. Sent labs to Dr. Risa Macauley as well. She also has the flu as of yesterday. She will follow up with her PCP.

## 2015-08-24 NOTE — Telephone Encounter (Signed)
-----   Message from Gordy Levan, MD sent at 08/24/2015  9:10 AM EDT ----- Labs seen and need follow up: please let her know that some of chemistries were elevated on labs 08-23-15. May be med related, and looks like a lot going on from what I see in EMR since I saw her last in 08-2014. Let her know that I will send lab information to PCP and endocrine MD Buddy Duty); if she is having problems before I see her on 10-3, she should contact PCP or endocrine MD

## 2015-08-26 ENCOUNTER — Other Ambulatory Visit: Payer: Self-pay | Admitting: Urology

## 2015-09-01 ENCOUNTER — Other Ambulatory Visit: Payer: Self-pay | Admitting: Oncology

## 2015-09-06 ENCOUNTER — Ambulatory Visit (HOSPITAL_BASED_OUTPATIENT_CLINIC_OR_DEPARTMENT_OTHER): Payer: BLUE CROSS/BLUE SHIELD | Admitting: Oncology

## 2015-09-06 ENCOUNTER — Other Ambulatory Visit: Payer: Self-pay

## 2015-09-06 ENCOUNTER — Telehealth: Payer: Self-pay | Admitting: Oncology

## 2015-09-06 ENCOUNTER — Encounter: Payer: Self-pay | Admitting: Oncology

## 2015-09-06 VITALS — BP 172/73 | HR 73 | Temp 98.0°F | Resp 20 | Ht 60.0 in | Wt 176.0 lb

## 2015-09-06 DIAGNOSIS — D0512 Intraductal carcinoma in situ of left breast: Secondary | ICD-10-CM

## 2015-09-06 DIAGNOSIS — R21 Rash and other nonspecific skin eruption: Secondary | ICD-10-CM | POA: Diagnosis not present

## 2015-09-06 DIAGNOSIS — Z853 Personal history of malignant neoplasm of breast: Secondary | ICD-10-CM | POA: Diagnosis not present

## 2015-09-06 DIAGNOSIS — Z95828 Presence of other vascular implants and grafts: Secondary | ICD-10-CM

## 2015-09-06 DIAGNOSIS — B029 Zoster without complications: Secondary | ICD-10-CM

## 2015-09-06 MED ORDER — FAMCICLOVIR 500 MG PO TABS: 500.0000 mg | ORAL_TABLET | Freq: Three times a day (TID) | ORAL | Status: DC

## 2015-09-06 NOTE — Telephone Encounter (Signed)
Appointments made and avs printed for patient °

## 2015-09-06 NOTE — Progress Notes (Signed)
OFFICE PROGRESS NOTE   September 06, 2015   Physicians:C.Melinda Crutch, T.Rosenbower, J.Hayes, Clarita Leber [facility])/ Rosita Fire, Nicholaus Bloom, H.Elsner  INTERVAL HISTORY:  Patient is seen, together with husband, in yearly follow up of history of stage 1 left breast carcinoma with subsequent left breast DCIS. Since she was here in 08-2014, she had right mastectomy by Dr Zella Richer 09-25-14 with no malignancy on that final pathology.  Patient was intolerant to tamoxifen after the left breast cancer and has been followed thru this office on observation.  She no longer needs mammograms as she has now had bilateral mastectomies.  Patient has healed well from the right mastectomy, tho there is some redundant tissue moreso than on the left side where she also had radiation. She has no concerns on left.  She has PAC that was placed by Dr Zella Richer [phone] during admission for Addison's crisis, kept due to extremely difficult peripheral IV access. Patient has multiple comorbid conditions including Addison's disease, and frequently does require medical interventions; she tells me that at last ED visit it took 2.5 hours to establish peripheral IV access, finally done in right groin. Tho she and husband understand that there is risk of infection or clot with PAC, she much prefers to keep this access for now. The PAC is flushed at this office every 6-8 weeks when not otherwise used.  Patient tells me that she has been diagnosed with very large right kidney stone, not candidate for lithotripsy due to spinal stimulator; Dr Risa Menn follows. She had the spinal stimulator replaced by Dr Ellene Route in 04-2015. She has not had Reclast as had been planned for early Sept (ok to use [facility] for that in hospital outpatient setting). She reports having "the flu" in last 2 weeks, possibly causing elevated LFTs on labs 08-23-15. She had chemistries repeated by Dr Harrington Challenger in past week and has follow up with him upcoming. She has had  painful rash above right eyebrow for past 4 days, which she believes is similar to previous episodes described as shingles; she has not contacted PCP about this rash, not clear that it began with vesicles, no vision symptoms.     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 She additionally has history of T1N0 ER/PR positive left breast cancer in 1993, treated with lumpectomy and axillary node evaluation and adjuvant adriamycin/ cytoxan by Dr.Neijstrom, local radiation, then unable to tolerate tamoxifen due to migraines.  She had right mastectomy 09-2014 with benign pathology    Review of systems as above, also: Reports still low grade fever (99.2) and achy from possible flu. No bleeding. No other recent infectious illness. No pain now from right kidney stone and urine no longer brownish. Remainder of 10 point Review of Systems negative.  Objective:  Vital signs in last 24 hours:  BP 172/73 mmHg  Pulse 73  Temp(Src) 98 F (36.7 C) (Oral)  Resp 20  Ht 5' (1.524 m)  Wt 176 lb (79.833 kg)  BMI 34.37 kg/m2  SpO2 96% Weight down 10 lbs from a year ago and down 30 lbs from 2014 (intentional including discontinuation of supplement drinks) Alert, oriented and appropriate, very pleasant, some difficulty staying on topic. Husband very supportive. Ambulatory with some assistance.    HEENT:PERRL, sclerae not icteric. Oral mucosa moist without lesions, posterior pharynx clear.  Neck  supple. No JVD.  Lymphatics:no cervical,supraclavicular, axillary adenopathy Resp: clear to auscultation bilaterally  Cardio: regular rate and rhythm. No gallop. GI: soft, nontender, not distended,. Normally active bowel sounds. . Musculoskeletal/ Extremities: without pitting edema, cords, tenderness Neuro: speech  fluent and appropriate, moves all extremities equally Skin  Clustered areas of light eschar over ~ 2 x 4 cm above right eyebrow, no vesicles. Otherwise without ecchymosis, petechiae Breasts: left mastectomy scar without evidence of local recurrence, skin there with post radiation changes and decreased mobility on chest wall. Right mastectomy scar well healed, no findings of concern, some redundant soft tissue. Axillae benign. Portacath-without erythema or tenderness  Lab Results:  Results for orders placed or performed in visit on 08/23/15  CBC with Differential  Result Value Ref Range   WBC 6.8 3.9 - 10.3 10e3/uL   NEUT# 3.7 1.5 - 6.5 10e3/uL   HGB 11.9 11.6 - 15.9 g/dL   HCT 36.1 34.8 - 46.6 %   Platelets 221 145 - 400 10e3/uL   MCV 81.6 79.5 - 101.0 fL   MCH 26.9 25.1 - 34.0 pg   MCHC 32.9 31.5 - 36.0 g/dL   RBC 4.43 3.70 - 5.45 10e6/uL   RDW 17.4 (H) 11.2 - 14.5 %   lymph# 2.4 0.9 - 3.3 10e3/uL   MONO# 0.5 0.1 - 0.9 10e3/uL   Eosinophils Absolute 0.2 0.0 - 0.5 10e3/uL   Basophils Absolute 0.0 0.0 - 0.1 10e3/uL   NEUT% 54.3 38.4 - 76.8 %   LYMPH% 35.3 14.0 - 49.7 %   MONO% 7.3 0.0 - 14.0 %   EOS% 2.5 0.0 - 7.0 %   BASO% 0.6 0.0 - 2.0 %  Comprehensive metabolic panel (Cmet) - [facility]  Result Value Ref Range   Sodium 143 136 - 145 mEq/L   Potassium 3.9 3.5 - 5.1 mEq/L   Chloride 105 98 - 109 mEq/L   CO2 31 (H) 22 - 29 mEq/L   Glucose 88 70 - 140 mg/dl   BUN 12.0 7.0 - 26.0 mg/dL   Creatinine 0.7 0.6 - 1.1 mg/dL   Total Bilirubin 0.93 0.20 - 1.20 mg/dL   Alkaline Phosphatase 141 40 - 150 U/L   AST 68 (H) 5 - 34 U/L   ALT 131 (H) 0 - 55 U/L   Total Protein 6.3 (L) 6.4 - 8.3 g/dL   Albumin 3.2 (L) 3.5 - 5.0 g/dL   Calcium 8.5 8.4 - 10.4 mg/dL   Anion Gap 7 3 - 11 mEq/L   EGFR >90 >90 ml/min/1.73 m2     Studies/Results: Surgical Pathology Imber, Marijayne K Collected: 09/25/2014 Client: [facility] Accession: [id number] Received: 09/25/2014 Odis Hollingshead,  PORT OF SURGICAL PATHOLOGYINAL DIAGNOSIS Diagnosis Breast, simple mastectomy, right - FIBROCYSTIC CHANGES. - BIOPSY SITE REACTION. - NO MALIGNANCY IDENTIFIED. Microscopic Comment There are extensive fibrocystic changes characterized by fibrosis, cyst and focal adenosis. There is also focal necrosis with hemorrhage associated with fibrosis and granulation tissue consistent with previous biopsy site. No malignancy is identified.    Medications: I have reviewed the patient's current medications.  DISCUSSION: Pros and cons of keeping PAC as above, agree to keep for now, continue flushes regularly when not otherwise used. I will see her back yearly and prn while PAC care is done at this office, no known active breast cancer or related complications now.   Possible shingles vs other skin rash. Start acyclovir as famvir and use topical antibiotic ointment. Keep scheduled visit with Dr Harrington Challenger this week  to follow up this also.   Assessment/Plan:  1. Stage 1 left breast cancer treated which chemo and radiation, and subsequent left breast DCIS: post left mastectomy. No known active malignancy.  2. Benign right breast biopsy 08-26-14, subsequent right mastectomy.   3.Addison's disease on chronic steroids, now followed by Dr Buddy Duty 4.pyelonephritis [phone] 5.L1 fracture sustained in fall a year ago 6.spinal stimulator in place, followed at pain clinic for chronic pain 7.PAC in, which needs to be flushed every 8 weeks when not otherwise being used. 8.obesity but progressive weight loss over last several years, primariy intentional  9. Erosive esophagitis at upper endoscopy 04-2012 10. Viral or flu-like illness in past 2 weeks, with chemistry abnormalities on labs drawn then. Has had follow up labs by [facility] and upcoming appointment there. 11.possible shingles vs other skin rash right forehead: she reports history of multiple episodes of shingles x years. Famvir and topical antibiotic now   Patient and  husband are in agreement with recommendations and plans. PAC flushes every 8 weeks until I see her back in a year. Time spent 25 min including >50% counseling and coordination of care. Cc Dr Harrington Challenger, Dr Hendricks Limes, MD   09/06/2015, 3:30 PM

## 2015-09-07 DIAGNOSIS — Z95828 Presence of other vascular implants and grafts: Secondary | ICD-10-CM | POA: Insufficient documentation

## 2015-09-07 DIAGNOSIS — B029 Zoster without complications: Secondary | ICD-10-CM | POA: Insufficient documentation

## 2015-09-10 NOTE — Progress Notes (Signed)
Please release orders in Epic surgery 09-29-15 pre op 09-24-15 Thanks

## 2015-09-16 ENCOUNTER — Encounter: Payer: Self-pay | Admitting: *Deleted

## 2015-09-16 NOTE — Progress Notes (Signed)
Received labs from [place] at [facility], sent to scan.

## 2015-09-23 NOTE — Progress Notes (Signed)
04/06/2015-noted in EPIC-EKG 03/29/2015-noted in EPIC-CT wo contrast

## 2015-09-23 NOTE — Patient Instructions (Addendum)
Daisy [name]  09/23/2015   Your procedure is scheduled on: Wednesday 09/29/2015  Report to [facility] Main  Entrance take [place]  elevators to 3rd floor to  [place] at  0800 AM.  Call this number if you have problems the morning of surgery [phone]   Remember: ONLY 1 PERSON MAY GO WITH YOU TO SHORT STAY TO GET  READY MORNING OF [place].  Do not eat food or drink liquids :After Midnight.     Take these medicines the morning of surgery with A SIP OF WATER: Clonazepam (Klonopin), Nexium (esmeprazole), Levothyroxine (synthroid), metoprolol (lopressor), prednisone, xopenex inhaler if needed,  symbicort inhaler if needed                                 You may not have any metal on your body including hair pins and              piercings  Do not wear jewelry, make-up, lotions, powders or perfumes, deodorant             Do not wear nail polish.  Do not shave  48 hours prior to surgery.              Men may shave face and neck.   Do not bring valuables to the hospital. [place].  Contacts, dentures or bridgework may not be worn into surgery.  Leave suitcase in the car. After surgery it may be brought to your room.     Patients discharged the day of surgery will not be allowed to drive home.  Name and phone number of your driver:  Special Instructions: deep breathing and leg exercises              Please read over the following fact sheets you were given: _____________________________________________________________________             [facility] - Preparing for Surgery Before surgery, you can play an important role.  Because skin is not sterile, your skin needs to be as free of germs as possible.  You can reduce the number of germs on your skin by washing with CHG (chlorahexidine gluconate) soap before surgery.  CHG is an antiseptic cleaner which kills germs and bonds with the skin to  continue killing germs even after washing. Please DO NOT use if you have an allergy to CHG or antibacterial soaps.  If your skin becomes reddened/irritated stop using the CHG and inform your nurse when you arrive at Short Stay. Do not shave (including legs and underarms) for at least 48 hours prior to the first CHG shower.  You may shave your face/neck. Please follow these instructions carefully:  1.  Shower with CHG Soap the night before surgery and the  morning of Surgery.  2.  If you choose to wash your hair, wash your hair first as usual with your  normal  shampoo.  3.  After you shampoo, rinse your hair and body thoroughly to remove the  shampoo.                           4.  Use CHG as you would any other liquid  soap.  You can apply chg directly  to the skin and wash                       Gently with a scrungie or clean washcloth.  5.  Apply the CHG Soap to your body ONLY FROM THE NECK DOWN.   Do not use on face/ open                           Wound or open sores. Avoid contact with eyes, ears mouth and genitals (private parts).                       Wash face,  Genitals (private parts) with your normal soap.             6.  Wash thoroughly, paying special attention to the area where your surgery  will be performed.  7.  Thoroughly rinse your body with warm water from the neck down.  8.  DO NOT shower/wash with your normal soap after using and rinsing off  the CHG Soap.                9.  Pat yourself dry with a clean towel.            10.  Wear clean pajamas.            11.  Place clean sheets on your bed the night of your first shower and do not  sleep with pets. Day of Surgery : Do not apply any lotions/deodorants the morning of surgery.  Please wear clean clothes to the hospital/surgery center.  FAILURE TO FOLLOW THESE INSTRUCTIONS MAY RESULT IN THE CANCELLATION OF YOUR SURGERY PATIENT SIGNATURE_________________________________  NURSE  SIGNATURE__________________________________  ________________________________________________________________________   [name]  An incentive spirometer is a tool that can help keep your lungs clear and active. This tool measures how well you are filling your lungs with each breath. Taking long deep breaths may help reverse or decrease the chance of developing breathing (pulmonary) problems (especially infection) following:  A long period of time when you are unable to move or be active. BEFORE THE PROCEDURE   If the spirometer includes an indicator to show your best effort, your nurse or respiratory therapist will set it to a desired goal.  If possible, sit up straight or lean slightly forward. Try not to slouch.  Hold the incentive spirometer in an upright position. INSTRUCTIONS FOR USE  1. Sit on the edge of your bed if possible, or sit up as far as you can in bed or on a chair. 2. Hold the incentive spirometer in an upright position. 3. Breathe out normally. 4. Place the mouthpiece in your mouth and seal your lips tightly around it. 5. Breathe in slowly and as deeply as possible, raising the piston or the ball toward the top of the column. 6. Hold your breath for 3-5 seconds or for as long as possible. Allow the piston or ball to fall to the bottom of the column. 7. Remove the mouthpiece from your mouth and breathe out normally. 8. Rest for a few seconds and repeat Steps 1 through 7 at least 10 times every 1-2 hours when you are awake. Take your time and take a few normal breaths between deep breaths. 9. The spirometer may include an indicator to show your best effort. Use the indicator as a goal to  work toward during each repetition. 10. After each set of 10 deep breaths, practice coughing to be sure your lungs are clear. If you have an incision (the cut made at the time of surgery), support your incision when coughing by placing a pillow or rolled up towels firmly  against it. Once you are able to get out of bed, walk around indoors and cough well. You may stop using the incentive spirometer when instructed by your caregiver.  RISKS AND COMPLICATIONS  Take your time so you do not get dizzy or light-headed.  If you are in pain, you may need to take or ask for pain medication before doing incentive spirometry. It is harder to take a deep breath if you are having pain. AFTER USE  Rest and breathe slowly and easily.  It can be helpful to keep track of a log of your progress. Your caregiver can provide you with a simple table to help with this. If you are using the spirometer at home, follow these instructions: [place] IF:   You are having difficultly using the spirometer.  You have trouble using the spirometer as often as instructed.  Your pain medication is not giving enough relief while using the spirometer.  You develop fever of 100.5 F (38.1 C) or higher. SEEK IMMEDIATE MEDICAL CARE IF:   You cough up bloody sputum that had not been present before.  You develop fever of 102 F (38.9 C) or greater.  You develop worsening pain at or near the incision site. MAKE SURE YOU:   Understand these instructions.  Will watch your condition.  Will get help right away if you are not doing well or get worse. Document Released: 04/02/2007 Document Revised: 02/12/2012 Document Reviewed: 06/03/2007 [facility] Patient Information 2014 [place], [place].   ________________________________________________________________________

## 2015-09-24 ENCOUNTER — Encounter (HOSPITAL_COMMUNITY)
Admission: RE | Admit: 2015-09-24 | Discharge: 2015-09-24 | Disposition: A | Discharge status: Home / Self Care | Payer: BLUE CROSS/BLUE SHIELD | Source: Ambulatory Visit | Attending: Urology | Admitting: Urology

## 2015-09-24 ENCOUNTER — Other Ambulatory Visit: Payer: Self-pay | Admitting: Urology

## 2015-09-24 ENCOUNTER — Encounter (HOSPITAL_COMMUNITY): Payer: Self-pay

## 2015-09-24 DIAGNOSIS — N200 Calculus of kidney: Secondary | ICD-10-CM | POA: Diagnosis not present

## 2015-09-24 DIAGNOSIS — Z01818 Encounter for other preprocedural examination: Secondary | ICD-10-CM | POA: Diagnosis present

## 2015-09-24 LAB — CBC
HCT: 38.2 % (ref 36.0–46.0)
Hemoglobin: 12 g/dL (ref 12.0–15.0)
MCH: 26.1 pg (ref 26.0–34.0)
MCHC: 31.4 g/dL (ref 30.0–36.0)
MCV: 83 fL (ref 78.0–100.0)
PLATELETS: UNDETERMINED 10*3/uL (ref 150–400)
RBC: 4.6 MIL/uL (ref 3.87–5.11)
RDW: 16.8 % — AB (ref 11.5–15.5)
WBC: 8.9 10*3/uL (ref 4.0–10.5)

## 2015-09-24 LAB — BASIC METABOLIC PANEL
Anion gap: 7 (ref 5–15)
BUN: 16 mg/dL (ref 6–20)
CALCIUM: 8.9 mg/dL (ref 8.9–10.3)
CO2: 30 mmol/L (ref 22–32)
CREATININE: 0.91 mg/dL (ref 0.44–1.00)
Chloride: 102 mmol/L (ref 101–111)
GFR calc Af Amer: >60 mL/min (ref >60)
GLUCOSE: 102 mg/dL — AB (ref 65–99)
POTASSIUM: 4.8 mmol/L (ref 3.5–5.1)
Sodium: 139 mmol/L (ref 135–145)

## 2015-09-24 NOTE — Pre-Procedure Instructions (Signed)
EKG 04/06/15 epic CT chest with contract 03/29/15 epic  Pt has been diagnosed with Sleep Apnea but does not use her CPAP at home. Pt states it keeps her up at night. Placed note in special needs of OR schedule.

## 2015-09-29 ENCOUNTER — Encounter (HOSPITAL_COMMUNITY): Payer: Self-pay | Admitting: *Deleted

## 2015-09-29 ENCOUNTER — Ambulatory Visit (HOSPITAL_COMMUNITY): Payer: BLUE CROSS/BLUE SHIELD | Admitting: Anesthesiology

## 2015-09-29 ENCOUNTER — Encounter (HOSPITAL_COMMUNITY)
Admission: RE | Disposition: A | Discharge status: Home / Self Care | Payer: Self-pay | Source: Ambulatory Visit | Attending: Urology

## 2015-09-29 ENCOUNTER — Ambulatory Visit (HOSPITAL_COMMUNITY)
Admission: RE | Admit: 2015-09-29 | Discharge: 2015-09-29 | Disposition: A | Discharge status: Home / Self Care | Payer: BLUE CROSS/BLUE SHIELD | Source: Ambulatory Visit | Attending: Urology | Admitting: Urology

## 2015-09-29 DIAGNOSIS — J449 Chronic obstructive pulmonary disease, unspecified: Secondary | ICD-10-CM | POA: Diagnosis not present

## 2015-09-29 DIAGNOSIS — R21 Rash and other nonspecific skin eruption: Secondary | ICD-10-CM | POA: Diagnosis not present

## 2015-09-29 DIAGNOSIS — E2749 Other adrenocortical insufficiency: Secondary | ICD-10-CM | POA: Insufficient documentation

## 2015-09-29 DIAGNOSIS — F419 Anxiety disorder, unspecified: Secondary | ICD-10-CM | POA: Insufficient documentation

## 2015-09-29 DIAGNOSIS — G4730 Sleep apnea, unspecified: Secondary | ICD-10-CM | POA: Insufficient documentation

## 2015-09-29 DIAGNOSIS — Z79891 Long term (current) use of opiate analgesic: Secondary | ICD-10-CM | POA: Insufficient documentation

## 2015-09-29 DIAGNOSIS — G35 Multiple sclerosis: Secondary | ICD-10-CM | POA: Diagnosis not present

## 2015-09-29 DIAGNOSIS — Z9049 Acquired absence of other specified parts of digestive tract: Secondary | ICD-10-CM | POA: Diagnosis not present

## 2015-09-29 DIAGNOSIS — Z9981 Dependence on supplemental oxygen: Secondary | ICD-10-CM | POA: Diagnosis not present

## 2015-09-29 DIAGNOSIS — G2581 Restless legs syndrome: Secondary | ICD-10-CM | POA: Insufficient documentation

## 2015-09-29 DIAGNOSIS — K219 Gastro-esophageal reflux disease without esophagitis: Secondary | ICD-10-CM | POA: Diagnosis not present

## 2015-09-29 DIAGNOSIS — G8929 Other chronic pain: Secondary | ICD-10-CM | POA: Insufficient documentation

## 2015-09-29 DIAGNOSIS — E039 Hypothyroidism, unspecified: Secondary | ICD-10-CM | POA: Insufficient documentation

## 2015-09-29 DIAGNOSIS — J45909 Unspecified asthma, uncomplicated: Secondary | ICD-10-CM | POA: Diagnosis not present

## 2015-09-29 DIAGNOSIS — Z9013 Acquired absence of bilateral breasts and nipples: Secondary | ICD-10-CM | POA: Diagnosis not present

## 2015-09-29 DIAGNOSIS — I10 Essential (primary) hypertension: Secondary | ICD-10-CM | POA: Insufficient documentation

## 2015-09-29 DIAGNOSIS — G4733 Obstructive sleep apnea (adult) (pediatric): Secondary | ICD-10-CM | POA: Diagnosis not present

## 2015-09-29 DIAGNOSIS — Z79899 Other long term (current) drug therapy: Secondary | ICD-10-CM | POA: Insufficient documentation

## 2015-09-29 DIAGNOSIS — Z7952 Long term (current) use of systemic steroids: Secondary | ICD-10-CM | POA: Diagnosis not present

## 2015-09-29 DIAGNOSIS — M797 Fibromyalgia: Secondary | ICD-10-CM | POA: Diagnosis not present

## 2015-09-29 DIAGNOSIS — Z87442 Personal history of urinary calculi: Secondary | ICD-10-CM | POA: Diagnosis not present

## 2015-09-29 DIAGNOSIS — Z853 Personal history of malignant neoplasm of breast: Secondary | ICD-10-CM | POA: Insufficient documentation

## 2015-09-29 DIAGNOSIS — N200 Calculus of kidney: Secondary | ICD-10-CM

## 2015-09-29 DIAGNOSIS — Z9181 History of falling: Secondary | ICD-10-CM | POA: Diagnosis not present

## 2015-09-29 HISTORY — PX: HOLMIUM LASER APPLICATION: SHX5852

## 2015-09-29 HISTORY — PX: CYSTOSCOPY WITH RETROGRADE PYELOGRAM, URETEROSCOPY AND STENT PLACEMENT: SHX5789

## 2015-09-29 SURGERY — CYSTOURETEROSCOPY, WITH RETROGRADE PYELOGRAM AND STENT INSERTION
Anesthesia: General | Laterality: Right

## 2015-09-29 MED ORDER — LACTATED RINGERS IV SOLN
INTRAVENOUS | Status: DC
  Administered 2015-09-29: 1000 mL via INTRAVENOUS
  Administered 2015-09-29: 10:00:00 via INTRAVENOUS

## 2015-09-29 MED ORDER — URIBEL 118 MG PO CAPS: 1.0000 | ORAL_CAPSULE | Freq: Three times a day (TID) | ORAL | Status: DC | PRN

## 2015-09-29 MED ORDER — ONDANSETRON HCL 4 MG/2ML IJ SOLN: 4.0000 mg | Freq: Four times a day (QID) | INTRAMUSCULAR | Status: DC | PRN

## 2015-09-29 MED ORDER — IOHEXOL 300 MG/ML  SOLN
INTRAMUSCULAR | Status: DC | PRN
  Administered 2015-09-29: 15 mL via URETHRAL

## 2015-09-29 MED ORDER — CEFAZOLIN SODIUM-DEXTROSE 2-3 GM-% IV SOLR
INTRAVENOUS | Status: AC
  Filled 2015-09-29: qty 50

## 2015-09-29 MED ORDER — DEXAMETHASONE SODIUM PHOSPHATE 10 MG/ML IJ SOLN
INTRAMUSCULAR | Status: AC
  Filled 2015-09-29: qty 1

## 2015-09-29 MED ORDER — ONDANSETRON HCL 4 MG/2ML IJ SOLN
INTRAMUSCULAR | Status: DC | PRN
  Administered 2015-09-29: 4 mg via INTRAVENOUS

## 2015-09-29 MED ORDER — LIDOCAINE HCL 2 % EX GEL
CUTANEOUS | Status: AC
  Filled 2015-09-29: qty 5

## 2015-09-29 MED ORDER — ONDANSETRON HCL 4 MG/2ML IJ SOLN
INTRAMUSCULAR | Status: AC
  Filled 2015-09-29: qty 2

## 2015-09-29 MED ORDER — ACETAMINOPHEN 10 MG/ML IV SOLN
INTRAVENOUS | Status: AC
  Filled 2015-09-29: qty 100

## 2015-09-29 MED ORDER — CIPROFLOXACIN IN D5W 400 MG/200ML IV SOLN
INTRAVENOUS | Status: DC | PRN
  Administered 2015-09-29: 400 mg via INTRAVENOUS

## 2015-09-29 MED ORDER — PROPOFOL 10 MG/ML IV BOLUS
INTRAVENOUS | Status: AC
  Filled 2015-09-29: qty 20

## 2015-09-29 MED ORDER — KETOROLAC TROMETHAMINE 30 MG/ML IJ SOLN
INTRAMUSCULAR | Status: AC
  Administered 2015-09-29: 30 mg via INTRAVENOUS
  Filled 2015-09-29: qty 1

## 2015-09-29 MED ORDER — MIDAZOLAM HCL 2 MG/2ML IJ SOLN
INTRAMUSCULAR | Status: AC
  Filled 2015-09-29: qty 4

## 2015-09-29 MED ORDER — CIPROFLOXACIN IN D5W 400 MG/200ML IV SOLN
INTRAVENOUS | Status: AC
  Filled 2015-09-29: qty 200

## 2015-09-29 MED ORDER — FENTANYL CITRATE (PF) 100 MCG/2ML IJ SOLN
INTRAMUSCULAR | Status: AC
  Administered 2015-09-29: 25 ug via INTRAVENOUS
  Filled 2015-09-29: qty 2

## 2015-09-29 MED ORDER — SODIUM CHLORIDE 0.9 % IR SOLN
Status: DC | PRN
  Administered 2015-09-29: 7000 mL

## 2015-09-29 MED ORDER — LIDOCAINE HCL (CARDIAC) 20 MG/ML IV SOLN
INTRAVENOUS | Status: DC | PRN
  Administered 2015-09-29: 50 mg via INTRAVENOUS

## 2015-09-29 MED ORDER — LIDOCAINE HCL (CARDIAC) 20 MG/ML IV SOLN
INTRAVENOUS | Status: AC
  Filled 2015-09-29: qty 15

## 2015-09-29 MED ORDER — ACETAMINOPHEN 10 MG/ML IV SOLN
1000.0000 mg | Freq: Once | INTRAVENOUS | Status: AC
  Administered 2015-09-29: 1000 mg via INTRAVENOUS

## 2015-09-29 MED ORDER — PROPOFOL 10 MG/ML IV BOLUS
INTRAVENOUS | Status: DC | PRN
  Administered 2015-09-29: 150 mg via INTRAVENOUS

## 2015-09-29 MED ORDER — DEXAMETHASONE SODIUM PHOSPHATE 10 MG/ML IJ SOLN
INTRAMUSCULAR | Status: DC | PRN
  Administered 2015-09-29: 10 mg via INTRAVENOUS

## 2015-09-29 MED ORDER — LIDOCAINE HCL 2 % EX GEL
CUTANEOUS | Status: DC | PRN
  Administered 2015-09-29: 1 via URETHRAL

## 2015-09-29 MED ORDER — FENTANYL CITRATE (PF) 100 MCG/2ML IJ SOLN
INTRAMUSCULAR | Status: AC
  Filled 2015-09-29: qty 4

## 2015-09-29 MED ORDER — FENTANYL CITRATE (PF) 100 MCG/2ML IJ SOLN
INTRAMUSCULAR | Status: DC | PRN
  Administered 2015-09-29: 50 ug via INTRAVENOUS
  Administered 2015-09-29 (×2): 25 ug via INTRAVENOUS

## 2015-09-29 MED ORDER — FENTANYL CITRATE (PF) 100 MCG/2ML IJ SOLN
25.0000 ug | INTRAMUSCULAR | Status: DC | PRN
  Administered 2015-09-29: 50 ug via INTRAVENOUS
  Administered 2015-09-29: 25 ug via INTRAVENOUS

## 2015-09-29 MED ORDER — MIDAZOLAM HCL 5 MG/5ML IJ SOLN
INTRAMUSCULAR | Status: DC | PRN
  Administered 2015-09-29: 2 mg via INTRAVENOUS

## 2015-09-29 MED ORDER — CEFAZOLIN SODIUM-DEXTROSE 2-3 GM-% IV SOLR: 2.0000 g | INTRAVENOUS | Status: DC

## 2015-09-29 MED ORDER — KETOROLAC TROMETHAMINE 30 MG/ML IJ SOLN
30.0000 mg | Freq: Once | INTRAMUSCULAR | Status: AC
  Administered 2015-09-29: 30 mg via INTRAVENOUS

## 2015-09-29 SURGICAL SUPPLY — 19 items
BAG URINE DRAINAGE (UROLOGICAL SUPPLIES) ×1 IMPLANT
BASKET ZERO TIP NITINOL 2.4FR (BASKET) ×2 IMPLANT
BSKT STON RTRVL ZERO TP 2.4FR (BASKET) ×1
CATH INTERMIT  6FR 70CM (CATHETERS) ×2 IMPLANT
CLOTH BEACON ORANGE TIMEOUT ST (SAFETY) ×3 IMPLANT
FIBER LASER FLEXIVA 1000 (UROLOGICAL SUPPLIES) IMPLANT
FIBER LASER FLEXIVA 200 (UROLOGICAL SUPPLIES) ×2 IMPLANT
FIBER LASER FLEXIVA 365 (UROLOGICAL SUPPLIES) IMPLANT
FIBER LASER FLEXIVA 550 (UROLOGICAL SUPPLIES) IMPLANT
FIBER LASER TRAC TIP (UROLOGICAL SUPPLIES) ×4 IMPLANT
GLOVE BIOGEL M STRL SZ7.5 (GLOVE) ×13 IMPLANT
GOWN STRL REUS W/TWL XL LVL3 (GOWN DISPOSABLE) ×8 IMPLANT
GUIDEWIRE .038 (WIRE) IMPLANT
GUIDEWIRE ANG ZIPWIRE 038X150 (WIRE) ×2 IMPLANT
GUIDEWIRE STR DUAL SENSOR (WIRE) ×3 IMPLANT
IV NS IRRIG 3000ML ARTHROMATIC (IV SOLUTION) ×1 IMPLANT
PACK CYSTO (CUSTOM PROCEDURE TRAY) ×3 IMPLANT
SHEATH ACCESS URETERAL 38CM (SHEATH) ×2 IMPLANT
STENT CONTOUR 6FRX24X.038 (STENTS) ×2 IMPLANT

## 2015-09-29 NOTE — Anesthesia Preprocedure Evaluation (Signed)
Anesthesia Evaluation  Patient identified by MRN, date of birth, ID band Patient awake    Reviewed: Allergy & Precautions, NPO status , Patient's Chart, lab work & pertinent test results  History of Anesthesia Complications (+) PONV  Airway Mallampati: II   Neck ROM: full    Dental   Pulmonary shortness of breath, asthma , sleep apnea , COPD,  oxygen dependent,    breath sounds clear to auscultation       Cardiovascular hypertension,  Rhythm:regular Rate:Normal     Neuro/Psych  Headaches, PSYCHIATRIC DISORDERS Anxiety  Neuromuscular disease    GI/Hepatic hiatal hernia, GERD  ,(+) Hepatitis -, B  Endo/Other  Hypothyroidism obese  Renal/GU Renal diseasestones     Musculoskeletal  (+) Arthritis , Fibromyalgia -  Abdominal   Peds  Hematology   Anesthesia Other Findings   Reproductive/Obstetrics                             Anesthesia Physical Anesthesia Plan  ASA: III  Anesthesia Plan: General   Post-op Pain Management:    Induction: Intravenous  Airway Management Planned: LMA  Additional Equipment:   Intra-op Plan:   Post-operative Plan:   Informed Consent: I have reviewed the patients History and Physical, chart, labs and discussed the procedure including the risks, benefits and alternatives for the proposed anesthesia with the patient or authorized representative who has indicated his/her understanding and acceptance.     Plan Discussed with: CRNA, Anesthesiologist and Surgeon  Anesthesia Plan Comments:         Anesthesia Quick Evaluation

## 2015-09-29 NOTE — Discharge Instructions (Signed)
[facility] [phone] Post Ureteroscopy With or Without Stent Instructions  Definitions:  Ureter: The duct that transports urine from the kidney to the bladder. Stent:   A plastic hollow tube that is placed into the ureter, from the kidney to the                 bladder to prevent the ureter from swelling shut.  GENERAL INSTRUCTIONS:  Despite the fact that no skin incisions were used, the area around the ureter and bladder is raw and irritated. The stent is a foreign body which will further irritate the bladder wall. This irritation is manifested by increased frequency of urination, both day and night, and by an increase in the urge to urinate. In some, the urge to urinate is present almost always. Sometimes the urge is strong enough that you may not be able to stop yourself from urinating. The only real cure is to remove the stent and then give time for the bladder wall to heal which can't be done until the danger of the ureter swelling shut has passed, which varies.  You may see some blood in your urine while the stent is in place and a few days afterwards. Do not be alarmed, even if the urine was clear for a while. Get off your feet and drink lots of fluids until clearing occurs. If you start to pass clots or don't improve, call us.  DIET: You may return to your normal diet immediately. Because of the raw surface of your bladder, alcohol, spicy foods, acid type foods and drinks with caffeine may cause irritation or frequency and should be used in moderation. To keep your urine flowing freely and to avoid constipation, drink plenty of fluids during the day ( 8-10 glasses ). Tip: Avoid cranberry juice because it is very acidic.  ACTIVITY: Your physical activity doesn't need to be restricted. However, if you are very active, you may see some blood in your urine. We suggest that you reduce your activity under these circumstances until the bleeding has stopped.  BOWELS: It is  important to keep your bowels regular during the postoperative period. Straining with bowel movements can cause bleeding. A bowel movement every other day is reasonable. Use a mild laxative if needed, such as Milk of Magnesia 2-3 tablespoons, or 2 Dulcolax tablets. Call if you continue to have problems. If you have been taking narcotics for pain, before, during or after your surgery, you may be constipated. Take a laxative if necessary.   MEDICATION: You should resume your pre-surgery medications unless told not to. In addition you will often be given an antibiotic to prevent infection. These should be taken as prescribed until the bottles are finished unless you are having an unusual reaction to one of the drugs.  PROBLEMS YOU SHOULD REPORT TO [name]:  Fevers over 100.5 Fahrenheit.  Heavy bleeding, or clots ( See above notes about blood in urine ).  Inability to urinate.  Drug reactions ( hives, rash, nausea, vomiting, diarrhea ).  Severe burning or pain with urination that is not improving.  FOLLOW-UP: You will need a follow-up appointment to monitor your progress. Call for this appointment at the number listed above. Usually the first appointment will be about10 to 14 days after your surgery.

## 2015-09-29 NOTE — Transfer of Care (Signed)
Immediate Anesthesia Transfer of Care Note  Patient: Daisy [name]  Procedure(s) Performed: Procedure(s): CYSTOSCOPY WITH RIGHT RETROGRADE PYELOGRAM, URETEROSCOPY AND STENT PLACEMENT, STONE EXTRACTION WITH BASKET (Right) HOLMIUM LASER LITHOTRIPSY  (Right)  Patient Location: PACU  Anesthesia Type:General  Level of Consciousness:  sedated, patient cooperative and responds to stimulation  Airway & Oxygen Therapy:Patient Spontanous Breathing and Patient connected to face mask oxgen  Post-op Assessment:  Report given to PACU RN and Post -op Vital signs reviewed and stable  Post vital signs:  Reviewed and stable  Last Vitals:  Filed Vitals:   09/29/15 0827  BP: 132/107  Pulse: 75  Temp: 36.7 C  Resp: 18    Complications: No apparent anesthesia complications

## 2015-09-29 NOTE — Op Note (Signed)
Preoperative diagnosis: 12 x 15 mm right renal pelvic calculus Postoperative diagnosis: Same  Procedure: Cystoscopy, right retrograde pyelography, flexible ureteroscopy, holmium laser lithotripsy, basketing of stone fragments, placement of double-J stent   Surgeon: Bernestine Amass M.D.  Anesthesia: Gen.  Indications: Patient is had a known right renal calculus tediously in the lower pole of her kidney measuring 6-7 mm. At that time she elected observation. Recently she is experienced intermittent gross hematuria and imaging suggested a substantial increase in the stone to 12 x 15 mm with migration to the renal pelvis. We recommended intervention and discussed several options. She reported a previous poor experience with ESWL and opted for ureteroscopy with holmium laser lithotripsy. Risks benefits discussed with her at length.     Technique and findings: Patient was brought to the operating room where she had successful induction general anesthesia. She is placed in lithotomy position and prepped and draped in usual manner. She received perioperative antibiotics and had a appropriate surgical timeout performed.   Cystoscopy revealed unremarkable urethra and bladder. Right retrograde pyelography was performed. The right ureter itself appeared delicate without evidence of filling defect or obstruction. A large filling defect could be appreciated in the right renal pelvis without any significant dilation of the collecting system. The stone was also easily visualized on fluoroscopy.   A guidewire was placed to the right renal pelvis. The ureter was then engaged with a access sheath. Digital flexible ureteroscopy was then performed. Large stone was encountered in the right renal pelvis. No other obvious pathology was appreciated. The stone was fractured with a holmium laser lithotripter fiber. This was performed at 0.8 J 8 Hz. The stone was markedly dense and it was took considerable time to break this large  stone into adequately sized pieces that could be basket extracted. This procedure took much longer than the typical laser lithotripsy case. Once the stone had been frank cemented into pieces less than 4 mm proximally 15 pieces were individually basket extracted. Smaller fragments were left behind.   A 6 French 24 cm double-J stent was then placed with fluoroscopic as well as visual guidance. The patient appeared to tolerate the procedure well or no obvious complications or problems. She is brought to recovery room stable condition.

## 2015-09-29 NOTE — Anesthesia Postprocedure Evaluation (Signed)
Anesthesia Post Note  Patient: Daisy [name]  Procedure(s) Performed: Procedure(s) (LRB): CYSTOSCOPY WITH RIGHT RETROGRADE PYELOGRAM, URETEROSCOPY AND STENT PLACEMENT, STONE EXTRACTION WITH BASKET (Right) HOLMIUM LASER LITHOTRIPSY  (Right)  Anesthesia type: General  Patient location: PACU  Post pain: Pain level controlled and Adequate analgesia  Post assessment: Post-op Vital signs reviewed, Patient's Cardiovascular Status Stable, Respiratory Function Stable, Patent Airway and Pain level controlled  Last Vitals:  Filed Vitals:   09/29/15 1245  BP: 162/78  Pulse: 72  Temp:   Resp: 14    Post vital signs: Reviewed and stable  Level of consciousness: awake, alert  and oriented  Complications: No apparent anesthesia complications

## 2015-09-29 NOTE — H&P (Signed)
Daisy [name] [name] is an 64 y.o. female.   Chief Complaint: Metabolically active stone disease with enlarging right renal calculus and intermittent hematuria and nonspecific back discomfort HPI: Daisy [name] [name] is 64 years of age. When I saw her several years ago she was known to have a 7 mm stone in her right kidney. She had previously had a bad experience with ESWL and wanted to continue to observe her stone. She's been in our office several times in the last few weeks with intermittent hematuria and a variety of systemic complaints. Much of her symptomatology was not thought to be related to the stone but on imaging the stone has increased in size to approximately 12-15 mm. It now appears to have migrated closer to the renal pelvis and it was felt that intervention was indicated at this time. Because of her prior poor experience with ESWL she opted for ureteroscopy with holmium laser lithotripsy and presents now for that procedure.  Past Medical History  Diagnosis Date  . Asthma   . Hypertension   . Shingles   . Sleep apnea   . OSA on CPAP   . Hypothyroidism   . H/O esophageal spasm     "I can have as many as 31 TUMS before they stop"  . Radiation adverse effect     "100% blockage on left; 50% on right salivary; can drain w/massage"  . Restless leg syndrome, controlled 02/19/12    "much better since being on CPAP; been on that ~ 1 month now"  . COPD (chronic obstructive pulmonary disease) ([place])   . Pneumonia     "I've had it several times"  . Chronic bronchitis   . Positive TB test     "ran deep in my father's family; went on INH for awhile; it caused migraines"  . Adrenal insufficiency ([place])     "w/secondary Addison's"  . Anemia   . GERD (gastroesophageal reflux disease)   . H/O hiatal hernia   . Upper GI bleeding 1970-1980    "had 4"  . Hepatitis B infection 1971    "in high school; after drinking after football player"  . Migraines     "much better now" (02/19/12)  . Headache(784.0)     "when I don't eat" (02/19/12)  . Anxiety   . Arthritis   . Fibromyalgia   . Multiple sclerosis ([place]) 05/1988    "dr's in [place] don't think I have it; dr's in [place] still do" (02/19/12);  VERITGO, FALLS, DIFFICULTY WALKING, URINARY URGENCY, BLURRED VISION--DR. CARMEN DOHMEIER NEUROLOGIST- HAS NOT TOLERATED USUAL MEDS TO TREAT MS AND NOT ON ANY MS MEDS AT PRESENT  . On home oxygen therapy     04/06/15 pt reports was using 2L at night with CPAP but has stopped using CPAP  . Bruises easily   . Stress incontinence in female   . Addison's disease ([place])   . Fracture     LUMBAR 1 - FROM A FALL JUNE 2015 - PT HAS A LOT OF SEVERE BACK PAIN AND DIFFICULTY WALKING  . Falls frequently   . History of urinary urgency     AZO HELPS - DOES TURN URINE ORANGE; DR. Risa Abdulaziz IS PT'S UROLOGIST  . Chronic pain     DR. MARK PHILLIPS WITH PAIN CLINIC -  CHRONIC BACK PAIN AND SCOLIOSIS AND PREVIOUS BACK SURGERY AND NOW LUMBAR FRACTURE - PT HAS [employer] SPINAL STIMULATOR WHICH SHE CAN TURN ON AND OFF ; PT ON MORPINE AND CLONAZEPAM AND ROBAXIN  .  Rash     PT HAS ADULT ACNEA FLAREUPS - RED RASH NOW ON RT FACE   . Adenocarcinoma of breast ([place]) 07/13/1992    LEFT LUMPECTOMY AND CHEMO AND RADIATION;   NOW HAS RIGHT BREAST MASS 09/25/14 - PLANS MASTECTOMY   . Poor venous access     PT STATES HER PORT A CATH NOT TO BE USED FOR SURGERY/ IV'S  . Kidney stones     previous lithotripsy rt 30 + yrs ago - has bilateral stones now.  Marland Kitchen PONV (postoperative nausea and vomiting)     one episode 1993 vomiting after surgery  . Shortness of breath dyspnea     with exertion  . Brittle bone disease   . 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 Tonsillectomy and adenoidectomy  1964  . Mastectomy  2010    left  . Appendectomy  1960's  . Cholecystectomy  1980's  . Abdominal hysterectomy  1987  . Bilateral oophorectomy  1988  . Breast lumpectomy  07/13/1992    left  .  Spinal cord stimulator insertion  ~ 2004  . Back surgery  ~ 2004    "chipped disk out to put in permanent stimulator"  . Band hemorrhoidectomy  1979  . Salivary gland surgery  ~ 2002    "left was 100% blocked twice; right 50%; only had one surgery"  . Laparoscopy    . Portacath placement  10/30/2012    Procedure: INSERTION PORT-A-CATH;  Surgeon: Odis Hollingshead, MD;  Location: [facility];  Service: General;  Laterality: Right;  Right Ultrasound guided port-a-cath insertion  . Esophagogastroduodenoscopy N/A 04/17/2014    Procedure: ESOPHAGOGASTRODUODENOSCOPY (EGD);  Surgeon: Missy Sabins, MD;  Location: [facility];  Service: Endoscopy;  Laterality: N/A;  . Savory dilation N/A 04/17/2014    Procedure: SAVORY DILATION;  Surgeon: Missy Sabins, MD;  [facility];  Service: Endoscopy;  Laterality: N/A;  . Salivary gland surgery left    . Total mastectomy Right 09/25/2014    Procedure: RIGHT MASTECTOMY;  Surgeon: Jackolyn Confer, MD;  [facility];  Service: General;  Laterality: Right;  . Mastectomy Right 10/15  . Spinal cord stimulator insertion N/A 04/09/2015    Procedure: Spinal cord stimulator revision and replacement of paddle electrode;  Surgeon: Kristeen Miss, MD;  Location: [facility];  Service: Neurosurgery;  Laterality: N/A;  Spinal cord stimulator revision and replacement of paddle electrode    Family History  Problem Relation Age of Onset  . Lung cancer Paternal Uncle     smoker  . Breast cancer Cousin 31    maternal cousin  . Throat cancer Cousin     maternal cousin   Social History:  reports that she has never smoked. She has never used smokeless tobacco. She reports that she does not drink alcohol or use illicit drugs.  Allergies:  Allergies  Allergen Reactions  . Augmentin [Amoxicillin-Pot Clavulanate] Other (See Comments)    Bad headaches Has patient had a PCN reaction causing immediate rash, facial/tongue/throat swelling, SOB or lightheadedness with  hypotension: no Has patient had a PCN reaction causing severe rash involving mucus membranes or skin necrosis: no Has patient had a PCN reaction that required hospitalization no Has patient had a PCN reaction occurring within the last 10 years: no If all of the above answers are "NO", then may proceed with Cephalosporin use.   . Compazine Other (See  Comments)    Hyper and shaky  . Iohexol      Desc: ANAPHYLACTIC REACTION!-kmr-10/16/05   . Ivp Dye [Iodinated Diagnostic Agents] Anaphylaxis and Other (See Comments)    Fever and blood clot  . Lisinopril Cough  . Metformin And Related     Migraine   . Metoclopramide Hcl Other (See Comments)    Hyper, shaky  . Promethazine Hcl Other (See Comments)    Hyper,,,,shaky  . Sulfa Antibiotics Swelling    Hallucinate   . Terbinafine Hcl Other (See Comments)    Throat closed up.  . Vancomycin Swelling  . Guaifenesin Nausea And Vomiting, Swelling and Other (See Comments)    hyper  . Aldara [Imiquimod]     Active and sick   . Ambien [Zolpidem]     shaky  . Amlodipine Swelling  . Biaxin [Clarithromycin]     jittery  . Carafate [Sucralfate] Nausea Only    No appetite   . Clindamycin/Lincomycin     Can not take  . Dilaudid [Hydromorphone Hcl] Nausea And Vomiting    Headaches   . Keflex [Cephalexin]     Can not take  . Other     SOME TAPES CAUSE SKIN IRRITATION    Medications Prior to Admission  Medication Sig Dispense Refill  . b complex vitamins tablet Take 1 tablet by mouth every morning.    . budesonide-formoterol (SYMBICORT) 160-4.5 MCG/ACT inhaler Inhale 2 puffs into the lungs 2 (two) times daily as needed (for asthma).     . Calcium-Vitamin D-Vitamin K (VIACTIV) 163-845-36 MG-UNT-MCG CHEW Chew 3 each by mouth daily at 12 noon.     . clonazePAM (KLONOPIN) 1 MG tablet Take 1 mg by mouth 3 (three) times daily.     . diphenhydrAMINE (BENADRYL) 25 MG tablet Take 50 mg by mouth every 6 (six) hours as needed for itching or allergies.     Marland Kitchen esomeprazole (NEXIUM) 40 MG capsule Take 80 mg by mouth 2 (two) times daily.    . hydrOXYzine (ATARAX/VISTARIL) 50 MG tablet Take 50-100 mg by mouth 4 (four) times daily as needed for itching (allergies). PT STATES SHE ALWAYS TAKE 4 TIMES A  DAY    . levothyroxine (SYNTHROID, LEVOTHROID) 50 MCG tablet Take 50 mcg by mouth every other day.    . levothyroxine (SYNTHROID, LEVOTHROID) 75 MCG tablet Take 75 mcg by mouth every other day.     . methocarbamol (ROBAXIN) 500 MG tablet Take 500 mg by mouth 4 (four) times daily.    . metoprolol tartrate (LOPRESSOR) 25 MG tablet Take 12.5 mg by mouth 2 (two) times daily. 12.5 in the morning and if blood pressure is high in the afternoon she will take another half tab    . morphine (MSIR) 30 MG tablet Take 30 mg by mouth 2 (two) times daily. TAKES AT 1 AM AND 1 PM    . mupirocin ointment (BACTROBAN) 2 % Apply 1 application topically 2 (two) times daily as needed (skin and scalp).   5  . nystatin (MYCOSTATIN/NYSTOP) 100000 UNIT/GM POWD Apply 1 g topically 2 (two) times daily. 30 g 1  . ondansetron (ZOFRAN ODT) 4 MG disintegrating tablet Take 1 tablet (4 mg total) by mouth every 6 (six) hours as needed for nausea or vomiting. 15 tablet 0  . predniSONE (DELTASONE) 1 MG tablet Take 4 mg by mouth daily with breakfast.    . famciclovir (FAMVIR) 500 MG tablet Take 1 tablet (500 mg total) by mouth every 8 (  eight) hours. (Patient not taking: Reported on 09/17/2015) 21 tablet 0  . levalbuterol (XOPENEX HFA) 45 MCG/ACT inhaler Inhale 2 puffs into the lungs every 4 (four) hours as needed for shortness of breath.     . levalbuterol (XOPENEX) 1.25 MG/0.5ML nebulizer solution Take 1.25 mg by nebulization every 4 (four) hours as needed for wheezing or shortness of breath.    . oxyCODONE-acetaminophen (PERCOCET/ROXICET) 5-325 MG per tablet Take 1-2 tablets by mouth every 4 (four) hours as needed for moderate pain. (Patient not taking: Reported on 09/06/2015) 50 tablet 0    No  results found for this or any previous visit (from the past 48 hour(s)). No results found.  Review of Systems - Negative except *  Blood pressure 132/107, pulse 75, temperature 98 F (36.7 C), temperature source Oral, resp. rate 18, height 5\' 1"  (1.549 m), weight 80.287 kg (177 lb), SpO2 91 %. General appearance: alert, cooperative and no distress Neck: no adenopathy and no JVD Resp: clear to auscultation bilaterally Cardio: regular rate and rhythm GI: soft, non-tender; bowel sounds normal; no masses,  no organomegaly  Extremities: extremities normal, atraumatic, no cyanosis or edema Skin: Skin color, texture, turgor normal. No rashes or lesions Neurologic: Grossly normal  Assessment/Plan Enlarging right renal stone. Patient presents now for ureteroscopy with holmium laser lithotripsy. She will likely require stent placement. She appears understand the risks and benefits of this procedure and is given full informed consent. We plan on outpatient status. She understands its possible that a secondary procedure may be necessary depending on the stone burden and how much of the stone can be removed today.  Loreli Debruler S 09/29/2015, 9:41 AM

## 2015-10-26 ENCOUNTER — Ambulatory Visit
Admission: RE | Admit: 2015-10-26 | Discharge: 2015-10-26 | Disposition: A | Discharge status: Home / Self Care | Payer: BLUE CROSS/BLUE SHIELD | Source: Ambulatory Visit | Attending: Anesthesiology | Admitting: Anesthesiology

## 2015-10-26 ENCOUNTER — Other Ambulatory Visit: Payer: Self-pay | Admitting: Anesthesiology

## 2015-10-26 DIAGNOSIS — IMO0002 Reserved for concepts with insufficient information to code with codable children: Secondary | ICD-10-CM

## 2015-11-03 ENCOUNTER — Ambulatory Visit (HOSPITAL_BASED_OUTPATIENT_CLINIC_OR_DEPARTMENT_OTHER): Payer: BLUE CROSS/BLUE SHIELD

## 2015-11-03 VITALS — BP 118/53 | HR 68 | Temp 98.3°F | Resp 14

## 2015-11-03 DIAGNOSIS — Z853 Personal history of malignant neoplasm of breast: Secondary | ICD-10-CM | POA: Diagnosis not present

## 2015-11-03 DIAGNOSIS — Z95828 Presence of other vascular implants and grafts: Secondary | ICD-10-CM

## 2015-11-03 DIAGNOSIS — Z452 Encounter for adjustment and management of vascular access device: Secondary | ICD-10-CM

## 2015-11-03 MED ORDER — HEPARIN SOD (PORK) LOCK FLUSH 100 UNIT/ML IV SOLN
500.0000 [IU] | Freq: Once | INTRAVENOUS | Status: AC
  Administered 2015-11-03: 500 [IU] via INTRAVENOUS
  Filled 2015-11-03: qty 5

## 2015-11-03 MED ORDER — SODIUM CHLORIDE 0.9 % IJ SOLN
10.0000 mL | INTRAMUSCULAR | Status: DC | PRN
  Administered 2015-11-03: 10 mL via INTRAVENOUS
  Filled 2015-11-03: qty 10

## 2015-11-03 NOTE — Patient Instructions (Signed)

## 2015-11-12 ENCOUNTER — Telehealth: Payer: Self-pay | Admitting: Oncology

## 2015-11-12 NOTE — Telephone Encounter (Signed)
Lvm advising appt 08/03/16 @ 8am. Pt is MyChart [facility].

## 2015-12-05 DIAGNOSIS — R7611 Nonspecific reaction to tuberculin skin test without active tuberculosis: Secondary | ICD-10-CM

## 2015-12-05 HISTORY — DX: Nonspecific reaction to tuberculin skin test without active tuberculosis: R76.11

## 2015-12-27 ENCOUNTER — Ambulatory Visit (HOSPITAL_BASED_OUTPATIENT_CLINIC_OR_DEPARTMENT_OTHER): Payer: BLUE CROSS/BLUE SHIELD

## 2015-12-27 VITALS — BP 119/64 | HR 82 | Temp 98.4°F | Resp 16

## 2015-12-27 DIAGNOSIS — Z452 Encounter for adjustment and management of vascular access device: Secondary | ICD-10-CM

## 2015-12-27 DIAGNOSIS — Z95828 Presence of other vascular implants and grafts: Secondary | ICD-10-CM

## 2015-12-27 DIAGNOSIS — Z853 Personal history of malignant neoplasm of breast: Secondary | ICD-10-CM | POA: Diagnosis not present

## 2015-12-27 MED ORDER — SODIUM CHLORIDE 0.9 % IJ SOLN
10.0000 mL | INTRAMUSCULAR | Status: DC | PRN
  Administered 2015-12-27: 10 mL via INTRAVENOUS
  Filled 2015-12-27: qty 10

## 2015-12-27 MED ORDER — HEPARIN SOD (PORK) LOCK FLUSH 100 UNIT/ML IV SOLN
500.0000 [IU] | Freq: Once | INTRAVENOUS | Status: AC
  Administered 2015-12-27: 500 [IU] via INTRAVENOUS
  Filled 2015-12-27: qty 5

## 2016-02-21 ENCOUNTER — Ambulatory Visit (HOSPITAL_BASED_OUTPATIENT_CLINIC_OR_DEPARTMENT_OTHER): Payer: 59

## 2016-02-21 VITALS — BP 137/68 | HR 68 | Temp 98.4°F | Resp 18

## 2016-02-21 DIAGNOSIS — Z95828 Presence of other vascular implants and grafts: Secondary | ICD-10-CM

## 2016-02-21 DIAGNOSIS — Z452 Encounter for adjustment and management of vascular access device: Secondary | ICD-10-CM

## 2016-02-21 DIAGNOSIS — Z853 Personal history of malignant neoplasm of breast: Secondary | ICD-10-CM

## 2016-02-21 MED ORDER — SODIUM CHLORIDE 0.9% FLUSH
10.0000 mL | INTRAVENOUS | Status: DC | PRN
Start: 2016-02-21 — End: 2016-02-21
  Administered 2016-02-21: 10 mL via INTRAVENOUS
  Filled 2016-02-21: qty 10

## 2016-02-21 MED ORDER — HEPARIN SOD (PORK) LOCK FLUSH 100 UNIT/ML IV SOLN
500.0000 [IU] | Freq: Once | INTRAVENOUS | Status: AC
  Administered 2016-02-21: 500 [IU] via INTRAVENOUS
  Filled 2016-02-21: qty 5

## 2016-02-21 NOTE — Patient Instructions (Signed)

## 2016-04-17 ENCOUNTER — Telehealth: Payer: Self-pay

## 2016-04-17 NOTE — Telephone Encounter (Signed)
Patient called and after listening to her for over 30 minutes she explained that "someone from the [facility] called her and she was assuming it was Barbaraann Share".  She is requesting to be transferred to [facility] to discuss her most recent issues regarding a skin rash and MRSA.  Call transferred to [facility], [place].

## 2016-04-18 ENCOUNTER — Encounter (HOSPITAL_COMMUNITY): Payer: Self-pay | Admitting: *Deleted

## 2016-04-18 ENCOUNTER — Observation Stay (HOSPITAL_COMMUNITY)
Admission: EM | Admit: 2016-04-18 | Discharge: 2016-04-21 | Disposition: A | Discharge status: Home / Self Care | Payer: 59 | Attending: Internal Medicine | Admitting: Internal Medicine

## 2016-04-18 ENCOUNTER — Emergency Department (HOSPITAL_COMMUNITY): Payer: 59

## 2016-04-18 DIAGNOSIS — W01198A Fall on same level from slipping, tripping and stumbling with subsequent striking against other object, initial encounter: Secondary | ICD-10-CM | POA: Diagnosis not present

## 2016-04-18 DIAGNOSIS — Z87442 Personal history of urinary calculi: Secondary | ICD-10-CM | POA: Diagnosis not present

## 2016-04-18 DIAGNOSIS — M797 Fibromyalgia: Secondary | ICD-10-CM | POA: Diagnosis not present

## 2016-04-18 DIAGNOSIS — R52 Pain, unspecified: Secondary | ICD-10-CM

## 2016-04-18 DIAGNOSIS — Z91041 Radiographic dye allergy status: Secondary | ICD-10-CM | POA: Diagnosis not present

## 2016-04-18 DIAGNOSIS — R233 Spontaneous ecchymoses: Secondary | ICD-10-CM | POA: Diagnosis not present

## 2016-04-18 DIAGNOSIS — J45909 Unspecified asthma, uncomplicated: Secondary | ICD-10-CM | POA: Diagnosis not present

## 2016-04-18 DIAGNOSIS — Y998 Other external cause status: Secondary | ICD-10-CM | POA: Insufficient documentation

## 2016-04-18 DIAGNOSIS — K219 Gastro-esophageal reflux disease without esophagitis: Secondary | ICD-10-CM | POA: Insufficient documentation

## 2016-04-18 DIAGNOSIS — Z9012 Acquired absence of left breast and nipple: Secondary | ICD-10-CM | POA: Diagnosis not present

## 2016-04-18 DIAGNOSIS — G35 Multiple sclerosis: Secondary | ICD-10-CM | POA: Insufficient documentation

## 2016-04-18 DIAGNOSIS — Z91048 Other nonmedicinal substance allergy status: Secondary | ICD-10-CM | POA: Diagnosis not present

## 2016-04-18 DIAGNOSIS — E271 Primary adrenocortical insufficiency: Secondary | ICD-10-CM | POA: Diagnosis not present

## 2016-04-18 DIAGNOSIS — Z881 Allergy status to other antibiotic agents status: Secondary | ICD-10-CM | POA: Insufficient documentation

## 2016-04-18 DIAGNOSIS — G9340 Encephalopathy, unspecified: Principal | ICD-10-CM | POA: Insufficient documentation

## 2016-04-18 DIAGNOSIS — I10 Essential (primary) hypertension: Secondary | ICD-10-CM | POA: Diagnosis not present

## 2016-04-18 DIAGNOSIS — Z880 Allergy status to penicillin: Secondary | ICD-10-CM | POA: Insufficient documentation

## 2016-04-18 DIAGNOSIS — Y9389 Activity, other specified: Secondary | ICD-10-CM | POA: Diagnosis not present

## 2016-04-18 DIAGNOSIS — I7389 Other specified peripheral vascular diseases: Secondary | ICD-10-CM | POA: Insufficient documentation

## 2016-04-18 DIAGNOSIS — Z853 Personal history of malignant neoplasm of breast: Secondary | ICD-10-CM | POA: Diagnosis not present

## 2016-04-18 DIAGNOSIS — G8929 Other chronic pain: Secondary | ICD-10-CM | POA: Diagnosis not present

## 2016-04-18 DIAGNOSIS — R21 Rash and other nonspecific skin eruption: Secondary | ICD-10-CM | POA: Diagnosis not present

## 2016-04-18 DIAGNOSIS — G2581 Restless legs syndrome: Secondary | ICD-10-CM | POA: Insufficient documentation

## 2016-04-18 DIAGNOSIS — E039 Hypothyroidism, unspecified: Secondary | ICD-10-CM | POA: Diagnosis present

## 2016-04-18 DIAGNOSIS — Y92098 Other place in other non-institutional residence as the place of occurrence of the external cause: Secondary | ICD-10-CM | POA: Insufficient documentation

## 2016-04-18 DIAGNOSIS — E876 Hypokalemia: Secondary | ICD-10-CM | POA: Diagnosis present

## 2016-04-18 DIAGNOSIS — F419 Anxiety disorder, unspecified: Secondary | ICD-10-CM | POA: Diagnosis not present

## 2016-04-18 DIAGNOSIS — J449 Chronic obstructive pulmonary disease, unspecified: Secondary | ICD-10-CM | POA: Insufficient documentation

## 2016-04-18 DIAGNOSIS — Z9181 History of falling: Secondary | ICD-10-CM | POA: Insufficient documentation

## 2016-04-18 DIAGNOSIS — Z888 Allergy status to other drugs, medicaments and biological substances status: Secondary | ICD-10-CM | POA: Diagnosis not present

## 2016-04-18 DIAGNOSIS — R410 Disorientation, unspecified: Secondary | ICD-10-CM | POA: Diagnosis not present

## 2016-04-18 DIAGNOSIS — Z981 Arthrodesis status: Secondary | ICD-10-CM | POA: Diagnosis not present

## 2016-04-18 DIAGNOSIS — G4733 Obstructive sleep apnea (adult) (pediatric): Secondary | ICD-10-CM | POA: Diagnosis not present

## 2016-04-18 DIAGNOSIS — E079 Disorder of thyroid, unspecified: Secondary | ICD-10-CM

## 2016-04-18 LAB — URINE MICROSCOPIC-ADD ON

## 2016-04-18 LAB — COMPREHENSIVE METABOLIC PANEL
ALK PHOS: 49 U/L (ref 38–126)
ALT: 23 U/L (ref 14–54)
ANION GAP: 9 (ref 5–15)
AST: 45 U/L — ABNORMAL HIGH (ref 15–41)
Albumin: 3.7 g/dL (ref 3.5–5.0)
BILIRUBIN TOTAL: 0.6 mg/dL (ref 0.3–1.2)
BUN: 15 mg/dL (ref 6–20)
CALCIUM: 8.7 mg/dL — AB (ref 8.9–10.3)
CO2: 25 mmol/L (ref 22–32)
Chloride: 103 mmol/L (ref 101–111)
Creatinine, Ser: 1.02 mg/dL — ABNORMAL HIGH (ref 0.44–1.00)
GFR calc non Af Amer: 57 mL/min — ABNORMAL LOW (ref >60)
Glucose, Bld: 86 mg/dL (ref 65–99)
Potassium: 3.4 mmol/L — ABNORMAL LOW (ref 3.5–5.1)
SODIUM: 137 mmol/L (ref 135–145)
TOTAL PROTEIN: 6.3 g/dL — AB (ref 6.5–8.1)

## 2016-04-18 LAB — CBC WITH DIFFERENTIAL/PLATELET
Basophils Absolute: 0.1 10*3/uL (ref 0.0–0.1)
Basophils Relative: 1 %
EOS ABS: 0.1 10*3/uL (ref 0.0–0.7)
Eosinophils Relative: 2 %
HCT: 35 % — ABNORMAL LOW (ref 36.0–46.0)
HEMOGLOBIN: 11.7 g/dL — AB (ref 12.0–15.0)
LYMPHS ABS: 1.8 10*3/uL (ref 0.7–4.0)
Lymphocytes Relative: 23 %
MCH: 28.1 pg (ref 26.0–34.0)
MCHC: 33.4 g/dL (ref 30.0–36.0)
MCV: 84.1 fL (ref 78.0–100.0)
MONO ABS: 0.9 10*3/uL (ref 0.1–1.0)
MONOS PCT: 11 %
NEUTROS PCT: 63 %
Neutro Abs: 5 10*3/uL (ref 1.7–7.7)
Platelets: 213 10*3/uL (ref 150–400)
RBC: 4.16 MIL/uL (ref 3.87–5.11)
RDW: 15.5 % (ref 11.5–15.5)
WBC: 7.8 10*3/uL (ref 4.0–10.5)

## 2016-04-18 LAB — URINALYSIS, ROUTINE W REFLEX MICROSCOPIC
Glucose, UA: NEGATIVE mg/dL
Hgb urine dipstick: NEGATIVE
KETONES UR: 15 mg/dL — AB
Leukocytes, UA: NEGATIVE
NITRITE: NEGATIVE
Protein, ur: 30 mg/dL — AB
Specific Gravity, Urine: 1.034 — ABNORMAL HIGH (ref 1.005–1.030)
pH: 6 (ref 5.0–8.0)

## 2016-04-18 LAB — RAPID URINE DRUG SCREEN, HOSP PERFORMED
Amphetamines: NOT DETECTED
Barbiturates: NOT DETECTED
Benzodiazepines: POSITIVE — AB
COCAINE: NOT DETECTED
OPIATES: POSITIVE — AB
Tetrahydrocannabinol: NOT DETECTED

## 2016-04-18 LAB — AMMONIA: Ammonia: 16 umol/L (ref 9–35)

## 2016-04-18 LAB — CBG MONITORING, ED
Glucose-Capillary: 70 mg/dL (ref 65–99)
Glucose-Capillary: 100 mg/dL — ABNORMAL HIGH (ref 65–99)

## 2016-04-18 NOTE — ED Provider Notes (Signed)
CSN: [id number]     Arrival date & time 04/18/16  1740 History   First MD Initiated Contact with Patient 04/18/16 1744     Chief Complaint  Patient presents with  . Fall  . Altered Mental Status     (Consider location/radiation/quality/duration/timing/severity/associated sxs/prior Treatment) Patient is a 65 y.o. female presenting with fall. The history is provided by the patient.  Fall This is a new problem. The current episode started today. The problem occurs constantly. The problem has been unchanged. Associated symptoms include a rash. Nothing aggravates the symptoms. She has tried nothing for the symptoms. The treatment provided moderate relief.    Past Medical History  Diagnosis Date  . Asthma   . Hypertension   . Shingles   . Sleep apnea   . OSA on CPAP   . Hypothyroidism   . H/O esophageal spasm     "I can have as many as 35 TUMS before they stop"  . Radiation adverse effect     "100% blockage on left; 50% on right salivary; can drain w/massage"  . Restless leg syndrome, controlled 02/19/12    "much better since being on CPAP; been on that ~ 1 month now"  . COPD (chronic obstructive pulmonary disease) ([place])   . Pneumonia     "I've had it several times"  . Chronic bronchitis   . Positive TB test     "ran deep in my father's family; went on INH for awhile; it caused migraines"  . Adrenal insufficiency ([place])     "w/secondary Addison's"  . Anemia   . GERD (gastroesophageal reflux disease)   . H/O hiatal hernia   . Upper GI bleeding 1970-1980    "had 4"  . Hepatitis B infection 1971    "in high school; after drinking after football player"  . Migraines     "much better now" (02/19/12)  . Headache(784.0)     "when I don't eat" (02/19/12)  . Anxiety   . Arthritis   . Fibromyalgia   . Multiple sclerosis ([place]) 05/1988    "dr's in [place] don't think I have it; dr's in [place] still do" (02/19/12);  VERITGO, FALLS, DIFFICULTY WALKING, URINARY URGENCY, BLURRED  VISION--DR. CARMEN DOHMEIER NEUROLOGIST- HAS NOT TOLERATED USUAL MEDS TO TREAT MS AND NOT ON ANY MS MEDS AT PRESENT  . On home oxygen therapy     04/06/15 pt reports was using 2L at night with CPAP but has stopped using CPAP  . Bruises easily   . Stress incontinence in female   . Addison's disease ([place])   . Fracture     LUMBAR 1 - FROM A FALL JUNE 2015 - PT HAS A LOT OF SEVERE BACK PAIN AND DIFFICULTY WALKING  . Falls frequently   . History of urinary urgency     AZO HELPS - DOES TURN URINE ORANGE; DR. Risa Kusch IS PT'S UROLOGIST  . Chronic pain     DR. MARK PHILLIPS WITH PAIN CLINIC -  CHRONIC BACK PAIN AND SCOLIOSIS AND PREVIOUS BACK SURGERY AND NOW LUMBAR FRACTURE - PT HAS [employer] SPINAL STIMULATOR WHICH SHE CAN TURN ON AND OFF ; PT ON MORPINE AND CLONAZEPAM AND ROBAXIN  . Rash     PT HAS ADULT ACNEA FLAREUPS - RED RASH NOW ON RT FACE   . Adenocarcinoma of breast ([place]) 07/13/1992    LEFT LUMPECTOMY AND CHEMO AND RADIATION;   NOW HAS RIGHT BREAST MASS 09/25/14 - PLANS MASTECTOMY   . Poor venous access  PT STATES HER PORT A CATH NOT TO BE USED FOR SURGERY/ IV'S  . Kidney stones     previous lithotripsy rt 30 + yrs ago - has bilateral stones now.  Marland Kitchen PONV (postoperative nausea and vomiting)     one episode 1993 vomiting after surgery  . Shortness of breath dyspnea     with exertion  . Brittle bone disease   . 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 Tonsillectomy and adenoidectomy  1964  . Mastectomy  2010    left  . Appendectomy  1960's  . Cholecystectomy  1980's  . Abdominal hysterectomy  1987  . Bilateral oophorectomy  1988  . Breast lumpectomy  07/13/1992    left  . Spinal cord stimulator insertion  ~ 2004  . Back surgery  ~ 2004    "chipped disk out to put in permanent stimulator"  . Band hemorrhoidectomy  1979  . Salivary gland surgery  ~ 2002    "left was 100% blocked twice; right 50%; only had one surgery"  .  Laparoscopy    . Portacath placement  10/30/2012    Procedure: INSERTION PORT-A-CATH;  Surgeon: Odis Hollingshead, MD;  [facility];  Service: General;  Laterality: Right;  Right Ultrasound guided port-a-cath insertion  . Esophagogastroduodenoscopy N/A 04/17/2014    Procedure: ESOPHAGOGASTRODUODENOSCOPY (EGD);  Surgeon: Missy Sabins, MD;  Location: [facility];  Service: Endoscopy;  Laterality: N/A;  . Savory dilation N/A 04/17/2014    Procedure: SAVORY DILATION;  Surgeon: Missy Sabins, MD;  [facility];  Service: Endoscopy;  Laterality: N/A;  . Salivary gland surgery left    . Total mastectomy Right 09/25/2014    Procedure: RIGHT MASTECTOMY;  Surgeon: Jackolyn Confer, MD;  [facility];  Service: General;  Laterality: Right;  . Mastectomy Right 10/15  . Spinal cord stimulator insertion N/A 04/09/2015    Procedure: Spinal cord stimulator revision and replacement of paddle electrode;  Surgeon: Kristeen Miss, MD;  Location: [facility];  Service: Neurosurgery;  Laterality: N/A;  Spinal cord stimulator revision and replacement of paddle electrode  . Cystoscopy with retrograde pyelogram, ureteroscopy and stent placement Right 09/29/2015    Procedure: CYSTOSCOPY WITH RIGHT RETROGRADE PYELOGRAM, URETEROSCOPY AND STENT PLACEMENT, STONE EXTRACTION WITH BASKET;  Surgeon: Rana Snare, MD;  [facility];  Service: Urology;  Laterality: Right;  . Holmium laser application Right [id number]    Procedure: HOLMIUM LASER LITHOTRIPSY ;  Surgeon: Rana Snare, MD;  [facility];  Service: Urology;  Laterality: Right;   Family History  Problem Relation Age of Onset  . Lung cancer Paternal Uncle     smoker  . Breast cancer Cousin 61    maternal cousin  . Throat cancer Cousin     maternal cousin   Social History  Substance Use Topics  . Smoking status: Never Smoker   . Smokeless tobacco: Never Used  . Alcohol Use: No   OB History    No data available     Review of Systems   Respiratory: Negative for shortness of breath.   Skin: Positive for rash.  All other systems reviewed and are negative.     Allergies  Augmentin; Compazine; Iohexol; Ivp dye; Lisinopril; Metformin and related; Metoclopramide hcl; Promethazine hcl; Sulfa antibiotics; Terbinafine hcl; Vancomycin; Guaifenesin; Aldara; Ambien; Amlodipine; Biaxin; Carafate; Clindamycin/lincomycin; Dilaudid; Keflex; and Other  Home Medications   Prior to Admission medications  Medication Sig Start Date End Date Taking? Authorizing Provider  b complex vitamins tablet Take 1 tablet by mouth every morning.    Historical Provider, MD  budesonide-formoterol (SYMBICORT) 160-4.5 MCG/ACT inhaler Inhale 2 puffs into the lungs 2 (two) times daily as needed (for asthma).     Historical Provider, MD  Calcium-Vitamin D-Vitamin K (VIACTIV) [id number] MG-UNT-MCG CHEW Chew 3 each by mouth daily at 12 noon.     Historical Provider, MD  clonazePAM (KLONOPIN) 1 MG tablet Take 1 mg by mouth 3 (three) times daily.     Historical Provider, MD  diphenhydrAMINE (BENADRYL) 25 MG tablet Take 50 mg by mouth every 6 (six) hours as needed for itching or allergies.    Historical Provider, MD  esomeprazole (NEXIUM) 40 MG capsule Take 80 mg by mouth 2 (two) times daily.    Historical Provider, MD  famciclovir (FAMVIR) 500 MG tablet Take 1 tablet (500 mg total) by mouth every 8 (eight) hours. Patient not taking: Reported on 09/17/2015 09/06/15   Gordy Levan, MD  hydrOXYzine (ATARAX/VISTARIL) 50 MG tablet Take 50-100 mg by mouth 4 (four) times daily as needed for itching (allergies). PT STATES SHE ALWAYS TAKE 4 TIMES A  DAY    Historical Provider, MD  levalbuterol (XOPENEX HFA) 45 MCG/ACT inhaler Inhale 2 puffs into the lungs every 4 (four) hours as needed for shortness of breath.     Historical Provider, MD  levalbuterol (XOPENEX) 1.25 MG/0.5ML nebulizer solution Take 1.25 mg by nebulization every 4 (four) hours as needed for wheezing or  shortness of breath.    Historical Provider, MD  levothyroxine (SYNTHROID, LEVOTHROID) 50 MCG tablet Take 50 mcg by mouth every other day.    Historical Provider, MD  levothyroxine (SYNTHROID, LEVOTHROID) 75 MCG tablet Take 75 mcg by mouth every other day.     Historical Provider, MD  Meth-Hyo-M Bl-Na Phos-Ph Sal (URIBEL) 118 MG CAPS Take 1 capsule (118 mg total) by mouth 3 (three) times daily as needed. 09/29/15   Rana Snare, MD  methocarbamol (ROBAXIN) 500 MG tablet Take 500 mg by mouth 4 (four) times daily.    Historical Provider, MD  metoprolol tartrate (LOPRESSOR) 25 MG tablet Take 12.5 mg by mouth 2 (two) times daily. 12.5 in the morning and if blood pressure is high in the afternoon she will take another half tab    Historical Provider, MD  morphine (MSIR) 30 MG tablet Take 30 mg by mouth 2 (two) times daily. TAKES AT 1 AM AND 1 PM    Historical Provider, MD  mupirocin ointment (BACTROBAN) 2 % Apply 1 application topically 2 (two) times daily as needed (skin and scalp).  08/19/15   Historical Provider, MD  nystatin (MYCOSTATIN/NYSTOP) 100000 UNIT/GM POWD Apply 1 g topically 2 (two) times daily. 09/25/14   Jackolyn Confer, MD  ondansetron (ZOFRAN ODT) 4 MG disintegrating tablet Take 1 tablet (4 mg total) by mouth every 6 (six) hours as needed for nausea or vomiting. 04/20/14   Autumn Messing III, MD  oxyCODONE-acetaminophen (PERCOCET/ROXICET) 5-325 MG per tablet Take 1-2 tablets by mouth every 4 (four) hours as needed for moderate pain. Patient not taking: Reported on 09/06/2015 04/10/15   Newman Pies, MD  predniSONE (DELTASONE) 1 MG tablet Take 4 mg by mouth daily with breakfast.    Historical Provider, MD   BP 154/89 mmHg  Pulse 85  Temp(Src) 98.9 F (37.2 C) (Oral)  Resp 18  SpO2 97% Physical Exam  Constitutional: She is oriented to person,  place, and time. She appears well-developed and well-nourished.  HENT:  Head: Normocephalic.  Right Ear: External ear normal.  Left Ear: External  ear normal.  Nose: Nose normal.  Mouth/Throat: Oropharynx is clear and moist.  Eyes: Conjunctivae and EOM are normal. Pupils are equal, round, and reactive to light.  Neck: Normal range of motion.  Cardiovascular: Normal rate and normal heart sounds.   Pulmonary/Chest: Effort normal.  Abdominal: Soft. She exhibits no distension.  Musculoskeletal: She exhibits tenderness.  Small bruises arms.  Tender left ribs,    Neurological: She is alert and oriented to person, place, and time.  Pt drifts from subject to subject.  Pt answers questions but has to be redirected.    Skin: Skin is warm.  Psychiatric: She has a normal mood and affect.  Nursing note and vitals reviewed.   ED Course  Procedures (including critical care time) Labs Review Labs Reviewed  CBC WITH DIFFERENTIAL/PLATELET  COMPREHENSIVE METABOLIC PANEL  AMMONIA  URINALYSIS, ROUTINE W REFLEX MICROSCOPIC (NOT AT [facility])  URINE RAPID DRUG SCREEN, HOSP PERFORMED  CBG MONITORING, ED    Imaging Review No results found. I have personally reviewed and evaluated these images and lab results as part of my medical decision-making.   EKG Interpretation None      MDM   Final diagnoses:  Confusion   Pt's care turned over to Charlann Lange at [employer], PA-C 04/19/16 [place], MD 04/20/16 [phone]

## 2016-04-18 NOTE — ED Provider Notes (Signed)
?   History of MS - some discrepancy in documentation supporting diagnosis Here for fall, right rib and minor head injury. Two falls over 2 days.  Pending head CT, other imaging Husband left her here and ?returning at 10:00 pm.   Plan: review imaging when resulted; follow up on ammonia level which is pending; re-evaluate patient and likely discharge home.   10:15 - Re-evaluation: The patient is markedly confused, unable to stay on topic: at once talking about her headache and in the next moment talking about her mother's hospitalization course when the patient was born. Headache is described as severe occipital headache. She reports falls, and states she tripped yesterday and fell. A few minutes later she reports she had a sudden onset of dizziness, like vertigo, and this caused her to fall.   Discussed the patient's presentation with her husband Hemma Canel, [phone]) who states she has been hallucinating at home - talking to her mother who has been deceased for 15 years; seeing family in the house who are not in the state. He reports an increased number of falls recently. He also reports she has been placed on an antibiotic by ID at [facility] for skin infection that she has been known to poorly tolerate with questionable hallucinations with previous use.   The patient has a complicated medical history including MS (possible flare contributing to symptoms); adrenal insufficiency (on low dose steroid, no recent change); widespread skin infection refractory to Amoxil then Doxycycline, on new abx with onset symptoms of hallucinations shortly after. She complains of significant headache with history of fall today (negative CT).  The patient will need to be admitted for further evaluation of source of confusion and management of symptoms. At this point, her husband reports that current symptoms are not consistent with baseline condition even with fluctuations attributed to chronic medical problems.  [facility] paged for admission.  Charlann Lange, PA-C 04/18/16 2248  Sherwood Gambler, MD 04/19/16 [phone]

## 2016-04-18 NOTE — ED Notes (Signed)
Went to draw labs, patient off the unit for testing.

## 2016-04-18 NOTE — ED Notes (Signed)
Phlebotomy at the bedside  

## 2016-04-18 NOTE — ED Notes (Signed)
Nehemiah Settle, PA-C at the bedside.

## 2016-04-18 NOTE — H&P (Signed)
History and Physical    Daisy [name] [id number] DOB: [birth year] DOA: 04/18/2016  Referring MD/NP/PA: Charlann Lange, PA PCP:  Melinda Crutch, MD  Patient coming from: Home   Chief Complaint: altered mental status and fall  HPI: Daisy [name] is a 65 y.o. female with medical history significant of HTN, hypothyroidism, asthma/COPD, reported multiple sclerosis, Addison's disease; who presents after a fall found to be acutely altered. Per patient's husband she had been hallucinating at home talking to her deceased mother and seen family members in the home that are out of state. Husband states in the last 2 days she's had 2 falls. The most recent fall occurred last night when she tripped and fell hitting her head. Patient expresses the reason for her fall is that she had acute onset of "vertigo". Patient complained of headache in the occipital region. Patient was noted to have been changed changed to Bactrim on 5/10 by infectious disease at [facility] for this rash of her bilateral lower extremities thought to be bacterial folliculitis. Patient previously had been treated with Augmentin, doxycycline, clindamycin, and Mupirocin without relief of symptoms. Patient complains of itching related to the lesions. Husband denies any knowledge of of the patient having any recent fevers, chills, shortness of breath, chest pain, nausea, vomiting, abdominal pain, or diarrhea. She states that her multiple sclerosis had been previously treated with medication over 30 years ago, but she no longer wanted to be on these medications.  ED Course: Upon admission into the emergency department patient was seen to be afebrile with vital signs all within normal limits. Lab work revealed WBC of 7.8, hemoglobin 11.7, platelets of 213, potassium 3.4, creatinine 1.02. CT scan of the brain showed chronic small vessel disease with no acute abnormalities, urinalysis unremarkable, and chest x-ray showed no acute abnormalities.  [facility] hospitalists called to admit for further evaluation.  Review of Systems: As per HPI otherwise 10 point review of systems negative.    Past Medical History  Diagnosis Date  . Asthma   . Hypertension   . Shingles   . Sleep apnea   . OSA on CPAP   . Hypothyroidism   . H/O esophageal spasm     "I can have as many as 16 TUMS before they stop"  . Radiation adverse effect     "100% blockage on left; 50% on right salivary; can drain w/massage"  . Restless leg syndrome, controlled 02/19/12    "much better since being on CPAP; been on that ~ 1 month now"  . COPD (chronic obstructive pulmonary disease) ([place])   . Pneumonia     "I've had it several times"  . Chronic bronchitis   . Positive TB test     "ran deep in my father's family; went on INH for awhile; it caused migraines"  . Adrenal insufficiency ([place])     "w/secondary Addison's"  . Anemia   . GERD (gastroesophageal reflux disease)   . H/O hiatal hernia   . Upper GI bleeding 1970-1980    "had 4"  . Hepatitis B infection 1971    "in high school; after drinking after football player"  . Migraines     "much better now" (02/19/12)  . Headache(784.0)     "when I don't eat" (02/19/12)  . Anxiety   . Arthritis   . Fibromyalgia   . Multiple sclerosis ([place]) 05/1988    "dr's in [place] don't think I have it; dr's in [place] still do" (02/19/12);  VERITGO, FALLS, DIFFICULTY  WALKING, URINARY URGENCY, BLURRED VISION--DR. CARMEN DOHMEIER NEUROLOGIST- HAS NOT TOLERATED USUAL MEDS TO TREAT MS AND NOT ON ANY MS MEDS AT PRESENT  . On home oxygen therapy     04/06/15 pt reports was using 2L at night with CPAP but has stopped using CPAP  . Bruises easily   . Stress incontinence in female   . Addison's disease ([place])   . Fracture     LUMBAR 1 - FROM A FALL JUNE 2015 - PT HAS A LOT OF SEVERE BACK PAIN AND DIFFICULTY WALKING  . Falls frequently   . History of urinary urgency     [facility] HELPS - DOES TURN URINE ORANGE; DR. Risa Franey IS PT'S UROLOGIST   . Chronic pain     DR. MARK PHILLIPS WITH PAIN CLINIC -  CHRONIC BACK PAIN AND SCOLIOSIS AND PREVIOUS BACK SURGERY AND NOW LUMBAR FRACTURE - PT HAS [employer] SPINAL STIMULATOR WHICH SHE CAN TURN ON AND OFF ; PT ON MORPINE AND CLONAZEPAM AND ROBAXIN  . Rash     PT HAS ADULT ACNEA FLAREUPS - RED RASH NOW ON RT FACE   . Adenocarcinoma of breast ([place]) 07/13/1992    LEFT LUMPECTOMY AND CHEMO AND RADIATION;   NOW HAS RIGHT BREAST MASS 09/25/14 - PLANS MASTECTOMY   . Poor venous access     PT STATES HER PORT A CATH NOT TO BE USED FOR SURGERY/ IV'S  . Kidney stones     previous lithotripsy rt 30 + yrs ago - has bilateral stones now.  Marland Kitchen PONV (postoperative nausea and vomiting)     one episode 1993 vomiting after surgery  . Shortness of breath dyspnea     with exertion  . Brittle bone disease   . 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 Tonsillectomy and adenoidectomy  1964  . Mastectomy  2010    left  . Appendectomy  1960's  . Cholecystectomy  1980's  . Abdominal hysterectomy  1987  . Bilateral oophorectomy  1988  . Breast lumpectomy  07/13/1992    left  . Spinal cord stimulator insertion  ~ 2004  . Back surgery  ~ 2004    "chipped disk out to put in permanent stimulator"  . Band hemorrhoidectomy  1979  . Salivary gland surgery  ~ 2002    "left was 100% blocked twice; right 50%; only had one surgery"  . Laparoscopy    . Portacath placement  10/30/2012    Procedure: INSERTION PORT-A-CATH;  Surgeon: Odis Hollingshead, MD;  Location: [facility];  Service: General;  Laterality: Right;  Right Ultrasound guided port-a-cath insertion  . Esophagogastroduodenoscopy N/A 04/17/2014    Procedure: ESOPHAGOGASTRODUODENOSCOPY (EGD);  Surgeon: Missy Sabins, MD;  Location: [facility];  Service: Endoscopy;  Laterality: N/A;  . Savory dilation N/A 04/17/2014    Procedure: SAVORY DILATION;  Surgeon: Missy Sabins, MD;  Location: [facility];   Service: Endoscopy;  Laterality: N/A;  . Salivary gland surgery left    . Total mastectomy Right 09/25/2014    Procedure: RIGHT MASTECTOMY;  Surgeon: Jackolyn Confer, MD;  Location: [facility];  Service: General;  Laterality: Right;  . Mastectomy Right 10/15  . Spinal cord stimulator insertion N/A 04/09/2015    Procedure: Spinal cord stimulator revision and replacement of paddle electrode;  Surgeon: Kristeen Miss, MD;  Location: [facility];  Service: Neurosurgery;  Laterality: N/A;  Spinal cord stimulator revision and  replacement of paddle electrode  . Cystoscopy with retrograde pyelogram, ureteroscopy and stent placement Right 09/29/2015    Procedure: CYSTOSCOPY WITH RIGHT RETROGRADE PYELOGRAM, URETEROSCOPY AND STENT PLACEMENT, STONE EXTRACTION WITH BASKET;  Surgeon: Rana Snare, MD;  Location: [facility];  Service: Urology;  Laterality: Right;  . Holmium laser application Right [id number]    Procedure: HOLMIUM LASER LITHOTRIPSY ;  Surgeon: Rana Snare, MD;  Location: [facility];  Service: Urology;  Laterality: Right;     reports that she has never smoked. She has never used smokeless tobacco. She reports that she does not drink alcohol or use illicit drugs.  Allergies  Allergen Reactions  . Augmentin [Amoxicillin-Pot Clavulanate] Other (See Comments)    Bad headaches Has patient had a PCN reaction causing immediate rash, facial/tongue/throat swelling, SOB or lightheadedness with hypotension: no Has patient had a PCN reaction causing severe rash involving mucus membranes or skin necrosis: no Has patient had a PCN reaction that required hospitalization no Has patient had a PCN reaction occurring within the last 10 years: no If all of the above answers are "NO", then may proceed with Cephalosporin use.   . Compazine Other (See Comments)    Hyper and shaky  . Iohexol      Desc: ANAPHYLACTIC REACTION!-kmr-10/16/05   . Ivp Dye [Iodinated Diagnostic Agents] Anaphylaxis and Other (See Comments)     Fever and blood clot  . Lisinopril Cough  . Metformin And Related     Migraine   . Metoclopramide Hcl Other (See Comments)    Hyper, shaky  . Promethazine Hcl Other (See Comments)    Hyper,,,,shaky  . Sulfa Antibiotics Swelling    Hallucinate   . Terbinafine Hcl Other (See Comments)    Throat closed up.  . Vancomycin Swelling  . Guaifenesin Nausea And Vomiting, Swelling and Other (See Comments)    hyper  . Aldara [Imiquimod]     Active and sick   . Ambien [Zolpidem]     shaky  . Amlodipine Swelling  . Biaxin [Clarithromycin]     jittery  . Carafate [Sucralfate] Nausea Only    No appetite   . Clindamycin/Lincomycin     Can not take  . Dilaudid [Hydromorphone Hcl] Nausea And Vomiting    Headaches   . Keflex [Cephalexin]     Can not take  . Other     SOME TAPES CAUSE SKIN IRRITATION    Family History  Problem Relation Age of Onset  . Lung cancer Paternal Uncle     smoker  . Breast cancer Cousin 40    maternal cousin  . Throat cancer Cousin     maternal cousin    Prior to Admission medications   Medication Sig Start Date End Date Taking? Authorizing Provider  b complex vitamins tablet Take 1 tablet by mouth every morning.   Yes Historical Provider, MD  budesonide-formoterol (SYMBICORT) 160-4.5 MCG/ACT inhaler Inhale 2 puffs into the lungs 2 (two) times daily as needed (for asthma).    Yes Historical Provider, MD  Calcium-Vitamin D-Vitamin K (VIACTIV) [id number] MG-UNT-MCG CHEW Chew 3 each by mouth daily at 12 noon.    Yes Historical Provider, MD  clonazePAM (KLONOPIN) 1 MG tablet Take 1 mg by mouth 3 (three) times daily.    Yes Historical Provider, MD  dexlansoprazole (DEXILANT) 60 MG capsule Take 60 mg by mouth daily.   Yes Historical Provider, MD  diphenhydrAMINE (BENADRYL) 25 MG tablet Take 50 mg by mouth every 6 (six) hours as needed for  itching or allergies.   Yes Historical Provider, MD  hydrOXYzine (ATARAX/VISTARIL) 50 MG tablet Take 50-100 mg by mouth 4 (four)  times daily as needed for itching (allergies). PT STATES SHE ALWAYS TAKE 4 TIMES A  DAY   Yes Historical Provider, MD  levalbuterol (XOPENEX HFA) 45 MCG/ACT inhaler Inhale 2 puffs into the lungs every 4 (four) hours as needed for shortness of breath.    Yes Historical Provider, MD  levalbuterol (XOPENEX) 1.25 MG/0.5ML nebulizer solution Take 1.25 mg by nebulization every 4 (four) hours as needed for wheezing or shortness of breath.   Yes Historical Provider, MD  levothyroxine (SYNTHROID, LEVOTHROID) 50 MCG tablet Take 50 mcg by mouth every other day. Alternate with the 75mg  tablet always take the 2mcg on even days per spouse   Yes Historical Provider, MD  levothyroxine (SYNTHROID, LEVOTHROID) 75 MCG tablet Take 75 mcg by mouth every other day.    Yes Historical Provider, MD  Meth-Hyo-M Bl-Na Phos-Ph Sal (URIBEL) 118 MG CAPS Take 1 capsule (118 mg total) by mouth 3 (three) times daily as needed. Patient taking differently: Take 1 capsule by mouth 3 (three) times daily as needed. For UTI 09/29/15  Yes Rana Snare, MD  methocarbamol (ROBAXIN) 500 MG tablet Take 500 mg by mouth 4 (four) times daily.   Yes Historical Provider, MD  metoprolol tartrate (LOPRESSOR) 25 MG tablet Take 12.5 mg by mouth 2 (two) times daily. 12.5 in the morning and if blood pressure is high in the afternoon she will take another half tab   Yes Historical Provider, MD  morphine (MSIR) 30 MG tablet Take 30 mg by mouth 2 (two) times daily. TAKES AT 1 AM AND 1 PM   Yes Historical Provider, MD  mupirocin ointment (BACTROBAN) 2 % Apply 1 application topically 2 (two) times daily as needed (skin and scalp).  08/19/15  Yes Historical Provider, MD  nystatin (MYCOSTATIN/NYSTOP) 100000 UNIT/GM POWD Apply 1 g topically 2 (two) times daily. 09/25/14  Yes Jackolyn Confer, MD  ondansetron (ZOFRAN ODT) 4 MG disintegrating tablet Take 1 tablet (4 mg total) by mouth every 6 (six) hours as needed for nausea or vomiting. 04/20/14  Yes Autumn Messing III, MD    predniSONE (DELTASONE) 1 MG tablet Take 4 mg by mouth daily with breakfast.   Yes Historical Provider, MD  famciclovir (FAMVIR) 500 MG tablet Take 1 tablet (500 mg total) by mouth every 8 (eight) hours. Patient not taking: Reported on 09/17/2015 09/06/15   Gordy Levan, MD  oxyCODONE-acetaminophen (PERCOCET/ROXICET) 5-325 MG per tablet Take 1-2 tablets by mouth every 4 (four) hours as needed for moderate pain. Patient not taking: Reported on 09/06/2015 04/10/15   Newman Pies, MD    Physical Exam: Filed Vitals:   04/18/16 2215 04/18/16 2230 04/18/16 2315 04/18/16 2330  BP: 129/72 119/71 141/71 135/68  Pulse: 91 92 92 91  Temp:      TempSrc:      Resp:   25 18  SpO2: 95% 92% 90% 91%      Constitutional: NAD, calm, comfortable Filed Vitals:   04/18/16 2215 04/18/16 2230 04/18/16 2315 04/18/16 2330  BP: 129/72 119/71 141/71 135/68  Pulse: 91 92 92 91  Temp:      TempSrc:      Resp:   25 18  SpO2: 95% 92% 90% 91%   Eyes: PERRL, lids and conjunctivae normal ENMT: Mucous membranes are moist. Posterior pharynx clear of any exudate or lesions.Normal dentition.  Neck: normal, supple, no  masses, no thyromegaly Respiratory: clear to auscultation bilaterally, no wheezing, no crackles. Normal respiratory effort. No accessory muscle use.  Cardiovascular: Regular rate and rhythm, no murmurs / rubs / gallops. No extremity edema. 2+ pedal pulses. No carotid bruits.  Abdomen: no tenderness, no masses palpated. No hepatosplenomegaly. Bowel sounds positive.  Musculoskeletal: no clubbing / cyanosis. No joint deformity upper and lower extremities. Good ROM, no contractures. Normal muscle tone. Tenderness to palpation of the left knee. Skin: Multiple less than 1 cm scattered lesions of the bilateral lower extremities with signs of excoriation. A few bruises present of the upper and lower extremities. Neurologic: CN 2-12 grossly intact. Sensation intact, DTR normal. Strength 5/5 in all 4.   Psychiatric: Flight of ideas, denies suicidal ideation or hallucinations currently,  alert and oriented 3   Labs on Admission: I have personally reviewed following labs and imaging studies  CBC:  Recent Labs Lab 04/18/16 2051  WBC 7.8  NEUTROABS 5.0  HGB 11.7*  HCT 35.0*  MCV 84.1  PLT 123456   Basic Metabolic Panel:  Recent Labs Lab 04/18/16 2051  NA 137  K 3.4*  CL 103  CO2 25  GLUCOSE 86  BUN 15  CREATININE 1.02*  CALCIUM 8.7*   GFR: CrCl cannot be calculated (Unknown ideal weight.). Liver Function Tests:  Recent Labs Lab 04/18/16 2051  AST 45*  ALT 23  ALKPHOS 49  BILITOT 0.6  PROT 6.3*  ALBUMIN 3.7   No results for input(s): LIPASE, AMYLASE in the last 168 hours.  Recent Labs Lab 04/18/16 2059  AMMONIA 16   Coagulation Profile: No results for input(s): INR, PROTIME in the last 168 hours. Cardiac Enzymes: No results for input(s): CKTOTAL, CKMB, CKMBINDEX, TROPONINI in the last 168 hours. BNP (last 3 results) No results for input(s): PROBNP in the last 8760 hours. HbA1C: No results for input(s): HGBA1C in the last 72 hours. CBG:  Recent Labs Lab 04/18/16 2114 04/18/16 2221  GLUCAP 70 100*   Lipid Profile: No results for input(s): CHOL, HDL, LDLCALC, TRIG, CHOLHDL, LDLDIRECT in the last 72 hours. Thyroid Function Tests: No results for input(s): TSH, T4TOTAL, FREET4, T3FREE, THYROIDAB in the last 72 hours. Anemia Panel: No results for input(s): VITAMINB12, FOLATE, FERRITIN, TIBC, IRON, RETICCTPCT in the last 72 hours. Urine analysis:    Component Value Date/Time   COLORURINE AMBER* 04/18/2016 1843   APPEARANCEUR TURBID* 04/18/2016 1843   LABSPEC 1.034* 04/18/2016 1843   PHURINE 6.0 04/18/2016 1843   GLUCOSEU NEGATIVE 04/18/2016 1843   HGBUR NEGATIVE 04/18/2016 1843   BILIRUBINUR SMALL* 04/18/2016 1843   KETONESUR 15* 04/18/2016 1843   PROTEINUR 30* 04/18/2016 1843   UROBILINOGEN 1.0 05/17/2014 1753   NITRITE NEGATIVE 04/18/2016  1843   LEUKOCYTESUR NEGATIVE 04/18/2016 1843   Sepsis Labs:  No results found for this or any previous visit (from the past 240 hour(s)).   Radiological Exams on Admission: Dg Chest 1 View  04/18/2016  CLINICAL DATA:  [place] today on rock walk weight at home, struck head, neck soreness, pain, history asthma, hypertension, COPD, GERD, multiple sclerosis, breast cancer, Addison disease EXAM: CHEST 1 VIEW COMPARISON:  Chest radiograph 01/21/2014; interval CT chest 03/22/2015 FINDINGS: RIGHT jugular Port-A-Cath with tip projecting over SVC. Intraspinal stimulator projects at T7-T9. Normal heart size, mediastinal contours, and pulmonary vascularity. Bronchitic changes without pulmonary infiltrate, pleural effusion or pneumothorax. Bones demineralized. Post LEFT mastectomy and axillary node dissection. Post cervical spine fusion. Dextro convex thoracic scoliosis. IMPRESSION: Chronic bronchitic changes. No acute abnormalities.  Electronically Signed   By: Lavonia Dana M.D.   On: 04/18/2016 20:56   Dg Cervical Spine Complete  04/18/2016  CLINICAL DATA:  Fall EXAM: CERVICAL SPINE - COMPLETE 4+ VIEW COMPARISON:  None ; correlation CT cervical spine 02/23/2010 FINDINGS: Examination performed supine in-collar. This may prevent identification of ligamentous and unstable injuries. Diffuse osseous demineralization. Anterior plate and screws at [id number]. Posterior plates and screws at C2-C3. Prevertebral soft tissues normal thickness. Vertebral body heights maintained. No gross evidence of fracture or subluxation identified on supine in-collar series. Suboptimal visualization of C1-C2 alignment. C7-T1 alignment inadequately visualized despite multiple swimmer's views. IMPRESSION: Osseous demineralization with postsurgical changes of anterior C5-C6 and posterior C2-C3 fusions. Nonvisualization of C7-T1 alignment. No definite acute bony abnormalities identified on supine in-collar cervical spine radiographs as discussed above.  Electronically Signed   By: Lavonia Dana M.D.   On: 04/18/2016 21:00   Ct Head Wo Contrast  04/18/2016  CLINICAL DATA:  Multiple falls today, struck cheek on ground and struck LEFT side of head the first time, second time fell backwards and struck back of head, initial encounter EXAM: CT HEAD WITHOUT CONTRAST TECHNIQUE: Contiguous axial images were obtained from the base of the skull through the vertex without intravenous contrast. COMPARISON:  02/28/2011 FINDINGS: Normal ventricular morphology. No midline shift or mass effect. Mild small vessel chronic ischemic changes of deep cerebral white matter. No intracranial hemorrhage, mass lesion, or evidence acute infarction. Probable tiny old lacunar infarcts RIGHT basal ganglia. No extra-axial fluid collections. Visualized paranasal sinuses and mastoid air cells clear. Skull intact. IMPRESSION: Small vessel chronic ischemic changes of deep cerebral white matter. Probable tiny old lacunar infarcts RIGHT basal ganglia. No acute intracranial abnormalities. Electronically Signed   By: Lavonia Dana M.D.   On: 04/18/2016 20:11     Assessment/Plan Acute encephalopathy: Patient noted to be hallucinating talking to deceased mother and seeing people not present in the room. Recent changes include the stoppage of clindamycin and Mupirocin. Then the addition of Bactrim to medication list by infectious disease. Question the possibility of medication side effect versus dehydration. - Admit to a MedSurg bed - IV fluids of normal saline at 75 mL per hour  - Continue to monitor and reassess in a.m.  Bacterial folliculitis: Patient under the care of infectious disease at [facility] hospital. Patient was started on Bactrim recently and discontinued on other medications. - Held Bactrim for now to see if symptoms improve  Essential hypertension - Continue metoprolol  Hypothyroidism - Check TSH  - Continue levothyroxine  Hypokalemia: Potassium 3.4 on  admission - Potassium chloride 30 mEq 1 dose now -Continue to monitor and replace as needed  Anxiety  - Continue Klonopin  prn   Addison's disease - Continue prednisone  Chronic pain - Continue the morphine sulfate - did not restart oxycodone  GERD - pharmacy substitution for Protonix   DVT prophylaxis: Lovenox Code Status: Full Family Communication: None Disposition Plan: possible discharge home in 1-2 days Consults called:  None Admission status: observation Medsurg   Norval Morton MD [facility] Pager [phone]  If 7PM-7AM, please contact night-coverage [email or web address] Password [facility]  04/18/2016, 11:36 PM

## 2016-04-18 NOTE — ED Notes (Signed)
Patient undressed, in gown and ambulated to the bathroom to urinate; gave a specimen cup for patient to secure an urine sample

## 2016-04-18 NOTE — ED Notes (Signed)
cbg is 85

## 2016-04-18 NOTE — ED Notes (Signed)
Pt arrives from PCP via [facility]. Pt states she has had 2 falls today with unknown causes. Pt states she "just fell down" while outside. Pt has a hx of MRSA and is tangential when speaking. Pt went to urinate and got lost, pt thought she was in her room. Pt is altered from baseline according to PCP.

## 2016-04-18 NOTE — ED Notes (Signed)
Updated Shari, PA-C, on patient's CBG of 70. She ackowledges, advises to feed patient. Patient currently eating sandwich, and peanut butter crackers.

## 2016-04-18 NOTE — ED Notes (Signed)
Patient reports using walker at home. She ambulates around room without walker unassisted, and easily distracted. Used walker to ambulate to restroom, patient attempting to void. Discussed plan for admission. Patient refusing IV.

## 2016-04-18 NOTE — ED Notes (Signed)
Called phlebotomy to get pt blood.

## 2016-04-18 NOTE — ED Notes (Signed)
Attempted to ambulate patient, she delays "waiting for headache to go away". Rechecked cbg, 100.

## 2016-04-19 DIAGNOSIS — M310 Hypersensitivity angiitis: Secondary | ICD-10-CM

## 2016-04-19 DIAGNOSIS — L739 Follicular disorder, unspecified: Secondary | ICD-10-CM

## 2016-04-19 DIAGNOSIS — G9340 Encephalopathy, unspecified: Secondary | ICD-10-CM | POA: Diagnosis not present

## 2016-04-19 DIAGNOSIS — E876 Hypokalemia: Secondary | ICD-10-CM | POA: Diagnosis present

## 2016-04-19 DIAGNOSIS — E271 Primary adrenocortical insufficiency: Secondary | ICD-10-CM | POA: Diagnosis not present

## 2016-04-19 LAB — BASIC METABOLIC PANEL
ANION GAP: 11 (ref 5–15)
BUN: 13 mg/dL (ref 6–20)
CHLORIDE: 105 mmol/L (ref 101–111)
CO2: 24 mmol/L (ref 22–32)
Calcium: 8.5 mg/dL — ABNORMAL LOW (ref 8.9–10.3)
Creatinine, Ser: 0.86 mg/dL (ref 0.44–1.00)
Glucose, Bld: 78 mg/dL (ref 65–99)
POTASSIUM: 3.4 mmol/L — AB (ref 3.5–5.1)
SODIUM: 140 mmol/L (ref 135–145)

## 2016-04-19 LAB — PROTIME-INR
INR: 1.15 (ref 0.00–1.49)
PROTHROMBIN TIME: 14.9 s (ref 11.6–15.2)

## 2016-04-19 LAB — APTT: APTT: 39 s — AB (ref 24–37)

## 2016-04-19 LAB — CBC
HEMATOCRIT: 35.5 % — AB (ref 36.0–46.0)
Hemoglobin: 11.4 g/dL — ABNORMAL LOW (ref 12.0–15.0)
MCH: 27.2 pg (ref 26.0–34.0)
MCHC: 32.1 g/dL (ref 30.0–36.0)
MCV: 84.7 fL (ref 78.0–100.0)
Platelets: 190 10*3/uL (ref 150–400)
RBC: 4.19 MIL/uL (ref 3.87–5.11)
RDW: 15.7 % — AB (ref 11.5–15.5)
WBC: 6.3 10*3/uL (ref 4.0–10.5)

## 2016-04-19 LAB — T4, FREE: Free T4: 1.48 ng/dL — ABNORMAL HIGH (ref 0.61–1.12)

## 2016-04-19 LAB — TSH: TSH: 0.301 u[IU]/mL — ABNORMAL LOW (ref 0.350–4.500)

## 2016-04-19 MED ORDER — ONDANSETRON HCL 4 MG/2ML IJ SOLN: 4.0000 mg | Freq: Four times a day (QID) | INTRAMUSCULAR | Status: DC | PRN

## 2016-04-19 MED ORDER — ACETAMINOPHEN 325 MG PO TABS
650.0000 mg | ORAL_TABLET | Freq: Four times a day (QID) | ORAL | Status: DC | PRN
  Administered 2016-04-20: 650 mg via ORAL
  Filled 2016-04-19: qty 2

## 2016-04-19 MED ORDER — ACETAMINOPHEN 650 MG RE SUPP: 650.0000 mg | Freq: Four times a day (QID) | RECTAL | Status: DC | PRN

## 2016-04-19 MED ORDER — B COMPLEX-C PO TABS
1.0000 | ORAL_TABLET | Freq: Every day | ORAL | Status: DC
  Administered 2016-04-19 – 2016-04-21 (×3): 1 via ORAL
  Filled 2016-04-19 (×4): qty 1

## 2016-04-19 MED ORDER — DIPHENHYDRAMINE HCL 25 MG PO CAPS
50.0000 mg | ORAL_CAPSULE | Freq: Four times a day (QID) | ORAL | Status: DC | PRN
  Administered 2016-04-20: 50 mg via ORAL
  Filled 2016-04-19: qty 2

## 2016-04-19 MED ORDER — LEVOTHYROXINE SODIUM 50 MCG PO TABS
50.0000 ug | ORAL_TABLET | ORAL | Status: DC
  Administered 2016-04-20: 50 ug via ORAL
  Filled 2016-04-19: qty 1

## 2016-04-19 MED ORDER — LEVOTHYROXINE SODIUM 75 MCG PO TABS
75.0000 ug | ORAL_TABLET | ORAL | Status: DC
  Administered 2016-04-19: 75 ug via ORAL
  Filled 2016-04-19 (×2): qty 1

## 2016-04-19 MED ORDER — POTASSIUM CHLORIDE CRYS ER 20 MEQ PO TBCR
20.0000 meq | EXTENDED_RELEASE_TABLET | Freq: Once | ORAL | Status: AC
  Administered 2016-04-19: 20 meq via ORAL
  Filled 2016-04-19: qty 1

## 2016-04-19 MED ORDER — LEVALBUTEROL HCL 1.25 MG/0.5ML IN NEBU
1.2500 mg | INHALATION_SOLUTION | RESPIRATORY_TRACT | Status: DC | PRN
  Filled 2016-04-19: qty 0.5

## 2016-04-19 MED ORDER — POTASSIUM CHLORIDE CRYS ER 20 MEQ PO TBCR
30.0000 meq | EXTENDED_RELEASE_TABLET | ORAL | Status: AC
  Administered 2016-04-19: 30 meq via ORAL
  Filled 2016-04-19: qty 1

## 2016-04-19 MED ORDER — FLUTICASONE FUROATE-VILANTEROL 200-25 MCG/INH IN AEPB
1.0000 | INHALATION_SPRAY | Freq: Every day | RESPIRATORY_TRACT | Status: DC
  Administered 2016-04-20: 1 via RESPIRATORY_TRACT
  Filled 2016-04-19: qty 28

## 2016-04-19 MED ORDER — MORPHINE SULFATE 15 MG PO TABS
30.0000 mg | ORAL_TABLET | Freq: Two times a day (BID) | ORAL | Status: DC
  Administered 2016-04-19 – 2016-04-21 (×3): 30 mg via ORAL
  Filled 2016-04-19 (×3): qty 2

## 2016-04-19 MED ORDER — POTASSIUM CHLORIDE CRYS ER 20 MEQ PO TBCR: 40.0000 meq | EXTENDED_RELEASE_TABLET | Freq: Once | ORAL | Status: DC

## 2016-04-19 MED ORDER — ENSURE ENLIVE PO LIQD: 237.0000 mL | Freq: Two times a day (BID) | ORAL | Status: DC | PRN

## 2016-04-19 MED ORDER — PANTOPRAZOLE SODIUM 40 MG PO TBEC
40.0000 mg | DELAYED_RELEASE_TABLET | Freq: Every day | ORAL | Status: DC
  Administered 2016-04-19 – 2016-04-21 (×3): 40 mg via ORAL
  Filled 2016-04-19 (×3): qty 1

## 2016-04-19 MED ORDER — CLONAZEPAM 1 MG PO TABS
1.0000 mg | ORAL_TABLET | Freq: Three times a day (TID) | ORAL | Status: DC
  Administered 2016-04-19 (×4): 1 mg via ORAL
  Filled 2016-04-19 (×4): qty 1

## 2016-04-19 MED ORDER — HYDROXYZINE HCL 25 MG PO TABS: 50.0000 mg | ORAL_TABLET | Freq: Four times a day (QID) | ORAL | Status: DC | PRN

## 2016-04-19 MED ORDER — NYSTATIN 100000 UNIT/GM EX POWD
1.0000 g | Freq: Two times a day (BID) | CUTANEOUS | Status: DC
  Administered 2016-04-19 – 2016-04-21 (×5): 1 g via TOPICAL
  Filled 2016-04-19: qty 15

## 2016-04-19 MED ORDER — METHOCARBAMOL 500 MG PO TABS
500.0000 mg | ORAL_TABLET | Freq: Three times a day (TID) | ORAL | Status: DC
  Administered 2016-04-19 (×2): 500 mg via ORAL
  Filled 2016-04-19 (×2): qty 1

## 2016-04-19 MED ORDER — ENOXAPARIN SODIUM 40 MG/0.4ML ~~LOC~~ SOLN
40.0000 mg | Freq: Every day | SUBCUTANEOUS | Status: DC
  Administered 2016-04-19 – 2016-04-21 (×3): 40 mg via SUBCUTANEOUS
  Filled 2016-04-19 (×3): qty 0.4

## 2016-04-19 MED ORDER — METOPROLOL TARTRATE 12.5 MG HALF TABLET
12.5000 mg | ORAL_TABLET | Freq: Two times a day (BID) | ORAL | Status: DC
  Administered 2016-04-19 – 2016-04-21 (×6): 12.5 mg via ORAL
  Filled 2016-04-19 (×6): qty 1

## 2016-04-19 MED ORDER — PREDNISONE 1 MG PO TABS
4.0000 mg | ORAL_TABLET | Freq: Every day | ORAL | Status: DC
  Administered 2016-04-19 – 2016-04-21 (×3): 4 mg via ORAL
  Filled 2016-04-19 (×3): qty 4

## 2016-04-19 MED ORDER — ALBUTEROL SULFATE (2.5 MG/3ML) 0.083% IN NEBU
2.5000 mg | INHALATION_SOLUTION | RESPIRATORY_TRACT | Status: DC | PRN
Start: 2016-04-19 — End: 2016-04-19

## 2016-04-19 MED ORDER — ONDANSETRON HCL 4 MG PO TABS: 4.0000 mg | ORAL_TABLET | Freq: Four times a day (QID) | ORAL | Status: DC | PRN

## 2016-04-19 MED ORDER — SODIUM CHLORIDE 0.9 % IV SOLN
INTRAVENOUS | Status: DC
  Administered 2016-04-19: 1000 mL via INTRAVENOUS

## 2016-04-19 NOTE — Consult Note (Signed)
[facility]  Total days of antibiotics 0               Reason for Consult: lower extremity rash    Referring Physician: Candiss Norse  Principal Problem:   Acute encephalopathy Active Problems:   Addison's disease ([place])   Hypertension   Thyroid disease   Confusion   Hypokalemia    HPI: Daisy [name] is a 65 y.o. female with history of MS, not currently on meds, Addison's disease, HTN, hx of breast CA who was admitted for recurrent falls and hallucinations. She was recently seen by ID clinic at [facility] for bacterial folliculitis of scalp, possibly forearms. She was given presumed MRSA decolonization course of treatment with 10d course of bactrim ds bid, mupirocin nares bid, and chlorhexedine bodywash. Her husband brings her to hospital for evaluation of AMS and falls. No lacerations noted but a few echymosis to her knees and arms. She does have new rash to her lower extremities, scattered nonblanching erythamatous macules/petechaiel like rash c/w leukocytoclastic vasculitis. The rash described to scalp, foreheard in arms from OP visit on 5/10 appears improved. She appears that she is slowly improving, still fatigued, sporadically answers questions, denies fever, chills, nightsweats, no pain to any joints presently, mainly fatigued.   Past Medical History  Diagnosis Date  . Asthma   . Hypertension   . Shingles   . Sleep apnea   . OSA on CPAP   . Hypothyroidism   . H/O esophageal spasm     "I can have as many as 54 TUMS before they stop"  . Radiation adverse effect     "100% blockage on left; 50% on right salivary; can drain w/massage"  . Restless leg syndrome, controlled 02/19/12    "much better since being on CPAP; been on that ~ 1 month now"  . COPD (chronic obstructive pulmonary disease) ([place])   . Pneumonia     "I've had it several times"  . Chronic bronchitis   . Positive TB test     "ran deep in my father's family; went on INH for awhile; it caused  migraines"  . Adrenal insufficiency ([place])     "w/secondary Addison's"  . Anemia   . GERD (gastroesophageal reflux disease)   . H/O hiatal hernia   . Upper GI bleeding 1970-1980    "had 4"  . Hepatitis B infection 1971    "in high school; after drinking after football player"  . Migraines     "much better now" (02/19/12)  . Headache(784.0)     "when I don't eat" (02/19/12)  . Anxiety   . Arthritis   . Fibromyalgia   . Multiple sclerosis ([place]) 05/1988    "dr's in [place] don't think I have it; dr's in [place] still do" (02/19/12);  VERITGO, FALLS, DIFFICULTY WALKING, URINARY URGENCY, BLURRED VISION--DR. CARMEN DOHMEIER NEUROLOGIST- HAS NOT TOLERATED USUAL MEDS TO TREAT MS AND NOT ON ANY MS MEDS AT PRESENT  . On home oxygen therapy     04/06/15 pt reports was using 2L at night with CPAP but has stopped using CPAP  . Bruises easily   . Stress incontinence in female   . Addison's disease ([place])   . Fracture     LUMBAR 1 - FROM A FALL JUNE 2015 - PT HAS A LOT OF SEVERE BACK PAIN AND DIFFICULTY WALKING  . Falls frequently   . History of urinary urgency     [facility] HELPS - DOES TURN  URINE ORANGE; DR. Risa Betzler IS PT'S UROLOGIST  . Chronic pain     DR. MARK PHILLIPS WITH PAIN CLINIC -  CHRONIC BACK PAIN AND SCOLIOSIS AND PREVIOUS BACK SURGERY AND NOW LUMBAR FRACTURE - PT HAS [employer] SPINAL STIMULATOR WHICH SHE CAN TURN ON AND OFF ; PT ON MORPINE AND CLONAZEPAM AND ROBAXIN  . Rash     PT HAS ADULT ACNEA FLAREUPS - RED RASH NOW ON RT FACE   . Adenocarcinoma of breast ([place]) 07/13/1992    LEFT LUMPECTOMY AND CHEMO AND RADIATION;   NOW HAS RIGHT BREAST MASS 09/25/14 - PLANS MASTECTOMY   . Poor venous access     PT STATES HER PORT A CATH NOT TO BE USED FOR SURGERY/ IV'S  . Kidney stones     previous lithotripsy rt 30 + yrs ago - has bilateral stones now.  Marland Kitchen PONV (postoperative nausea and vomiting)     one episode 1993 vomiting after surgery  . Shortness of breath dyspnea     with exertion  . Brittle  bone disease   . 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 Augmentin [Amoxicillin-Pot Clavulanate] Other (See Comments)    Bad headaches Has patient had a PCN reaction causing immediate rash, facial/tongue/throat swelling, SOB or lightheadedness with hypotension: no Has patient had a PCN reaction causing severe rash involving mucus membranes or skin necrosis: no Has patient had a PCN reaction that required hospitalization no Has patient had a PCN reaction occurring within the last 10 years: no If all of the above answers are "NO", then may proceed with Cephalosporin use.   . Compazine Other (See Comments)    Hyper and shaky  . Iohexol      Desc: ANAPHYLACTIC REACTION!-kmr-10/16/05   . Ivp Dye [Iodinated Diagnostic Agents] Anaphylaxis and Other (See Comments)    Fever and blood clot  . Lisinopril Cough  . Metformin And Related     Migraine   . Metoclopramide Hcl Other (See Comments)    Hyper, shaky  . Promethazine Hcl Other (See Comments)    Hyper,,,,shaky  . Sulfa Antibiotics Swelling    Hallucinate   . Terbinafine Hcl Other (See Comments)    Throat closed up.  . Vancomycin Swelling  . Guaifenesin Nausea And Vomiting, Swelling and Other (See Comments)    hyper  . Aldara [Imiquimod]     Active and sick   . Ambien [Zolpidem]     shaky  . Amlodipine Swelling  . Biaxin [Clarithromycin]     jittery  . Carafate [Sucralfate] Nausea Only    No appetite   . Clindamycin/Lincomycin     Can not take  . Dilaudid [Hydromorphone Hcl] Nausea And Vomiting    Headaches   . Keflex [Cephalexin]     Can not take  . Other     SOME TAPES CAUSE SKIN IRRITATION    MEDICATIONS: . B-complex with vitamin C  1 tablet Oral Daily  . clonazePAM  1 mg Oral TID  . enoxaparin (LOVENOX) injection  40 mg Subcutaneous Daily  . fluticasone furoate-vilanterol  1 puff Inhalation Daily  . [START ON 04/20/2016] levothyroxine  50 mcg Oral QODAY  .  levothyroxine  75 mcg Oral QODAY  . metoprolol tartrate  12.5 mg Oral BID  . morphine  30 mg Oral BID  . nystatin  1 g Topical BID  . pantoprazole  40 mg Oral Daily  . potassium  chloride  20 mEq Oral Once  . predniSONE  4 mg Oral Q breakfast    Social History  Substance Use Topics  . Smoking status: Never Smoker   . Smokeless tobacco: Never Used  . Alcohol Use: No    Family History  Problem Relation Age of Onset  . Lung cancer Paternal Uncle     smoker  . Breast cancer Cousin 33    maternal cousin  . Throat cancer Cousin     maternal cousin    Review of Systems -  Per hpi otherwise 10 point ros is negative. Mainly fatigue. New LE rash  OBJECTIVE: Temp:  [98.4 F (36.9 C)-99.7 F (37.6 C)] 99 F (37.2 C) (05/17 1320) Pulse Rate:  [78-98] 90 (05/17 1320) Resp:  [15-25] 22 (05/17 1320) BP: (119-161)/(54-89) 139/76 mmHg (05/17 1320) SpO2:  [90 %-97 %] 93 % (05/17 1320) Physical Exam  Constitutional:  oriented to person, place, and time. appears well-developed and well-nourished, slightly desheveled. No distress.  HENT: [place]/AT, PERRLA, no scleral icterus Mouth/Throat: Oropharynx is clear and moist. No oropharyngeal exudate.  Cardiovascular: Normal rate, regular rhythm and normal heart sounds. Exam reveals no gallop and no friction rub.  No murmur heard.  Pulmonary/Chest: Effort normal and breath sounds normal. No respiratory distress.  has no wheezes.  Neck = supple, no nuchal rigidity Abdominal: Soft. Bowel sounds are normal.  exhibits no distension. There is no tenderness.  Lymphadenopathy: no cervical adenopathy. No axillary adenopathy Neurological: alert and oriented to person, place, and time.  Skin: Skin is warm and dry. Scattered macules, erythematous,non-blanching, petechaiel only to legs sparing face, chest, torso, arms or upper thighs Psychiatric: a normal mood and affect.  behavior is normal.   LABS: Results for orders placed or performed during the hospital  encounter of 04/18/16 (from the past 48 hour(s))  Urinalysis, Routine w reflex microscopic (not at [facility])     Status: Abnormal   Collection Time: 04/18/16  6:43 PM  Result Value Ref Range   Color, Urine AMBER (A) YELLOW    Comment: BIOCHEMICALS MAY BE AFFECTED BY COLOR   APPearance TURBID (A) CLEAR   Specific Gravity, Urine 1.034 (H) 1.005 - 1.030   pH 6.0 5.0 - 8.0   Glucose, UA NEGATIVE NEGATIVE mg/dL   Hgb urine dipstick NEGATIVE NEGATIVE   Bilirubin Urine SMALL (A) NEGATIVE   Ketones, ur 15 (A) NEGATIVE mg/dL   Protein, ur 30 (A) NEGATIVE mg/dL   Nitrite NEGATIVE NEGATIVE   Leukocytes, UA NEGATIVE NEGATIVE  Urine rapid drug screen (hosp performed)     Status: Abnormal   Collection Time: 04/18/16  6:43 PM  Result Value Ref Range   Opiates POSITIVE (A) NONE DETECTED   Cocaine NONE DETECTED NONE DETECTED   Benzodiazepines POSITIVE (A) NONE DETECTED   Amphetamines NONE DETECTED NONE DETECTED   Tetrahydrocannabinol NONE DETECTED NONE DETECTED   Barbiturates NONE DETECTED NONE DETECTED    Comment:        DRUG SCREEN FOR MEDICAL PURPOSES ONLY.  IF CONFIRMATION IS NEEDED FOR ANY PURPOSE, NOTIFY LAB WITHIN 5 DAYS.        LOWEST DETECTABLE LIMITS FOR URINE DRUG SCREEN Drug Class       Cutoff (ng/mL) Amphetamine      1000 Barbiturate      200 Benzodiazepine   408 Tricyclics       144 Opiates          300 Cocaine  300 THC              50   Urine microscopic-add on     Status: Abnormal   Collection Time: 04/18/16  6:43 PM  Result Value Ref Range   Squamous Epithelial / LPF 0-5 (A) NONE SEEN   WBC, UA 0-5 0 - 5 WBC/hpf   RBC / HPF 0-5 0 - 5 RBC/hpf   Bacteria, UA FEW (A) NONE SEEN   Crystals CA OXALATE CRYSTALS (A) NEGATIVE    Comment: URIC ACID CRYSTALS   Urine-Other LESS THAN 10 mL OF URINE SUBMITTED     Comment: MICROSCOPIC EXAM PERFORMED ON UNCONCENTRATED URINE  CBC with Differential     Status: Abnormal   Collection Time: 04/18/16  8:51 PM  Result Value Ref  Range   WBC 7.8 4.0 - 10.5 K/uL   RBC 4.16 3.87 - 5.11 MIL/uL   Hemoglobin 11.7 (L) 12.0 - 15.0 g/dL   HCT 35.0 (L) 36.0 - 46.0 %   MCV 84.1 78.0 - 100.0 fL   MCH 28.1 26.0 - 34.0 pg   MCHC 33.4 30.0 - 36.0 g/dL   RDW 15.5 11.5 - 15.5 %   Platelets 213 150 - 400 K/uL   Neutrophils Relative % 63 %   Neutro Abs 5.0 1.7 - 7.7 K/uL   Lymphocytes Relative 23 %   Lymphs Abs 1.8 0.7 - 4.0 K/uL   Monocytes Relative 11 %   Monocytes Absolute 0.9 0.1 - 1.0 K/uL   Eosinophils Relative 2 %   Eosinophils Absolute 0.1 0.0 - 0.7 K/uL   Basophils Relative 1 %   Basophils Absolute 0.1 0.0 - 0.1 K/uL  Comprehensive metabolic panel     Status: Abnormal   Collection Time: 04/18/16  8:51 PM  Result Value Ref Range   Sodium 137 135 - 145 mmol/L   Potassium 3.4 (L) 3.5 - 5.1 mmol/L   Chloride 103 101 - 111 mmol/L   CO2 25 22 - 32 mmol/L   Glucose, Bld 86 65 - 99 mg/dL   BUN 15 6 - 20 mg/dL   Creatinine, Ser 1.02 (H) 0.44 - 1.00 mg/dL   Calcium 8.7 (L) 8.9 - 10.3 mg/dL   Total Protein 6.3 (L) 6.5 - 8.1 g/dL   Albumin 3.7 3.5 - 5.0 g/dL   AST 45 (H) 15 - 41 U/L   ALT 23 14 - 54 U/L   Alkaline Phosphatase 49 38 - 126 U/L   Total Bilirubin 0.6 0.3 - 1.2 mg/dL   GFR calc non Af Amer 57 (L) >60 mL/min   GFR calc Af Amer >60 >60 mL/min    Comment: (NOTE) The eGFR has been calculated using the CKD EPI equation. This calculation has not been validated in all clinical situations. eGFR's persistently <60 mL/min signify possible Chronic Kidney Disease.    Anion gap 9 5 - 15  Ammonia     Status: None   Collection Time: 04/18/16  8:59 PM  Result Value Ref Range   Ammonia 16 9 - 35 umol/L  CBG monitoring, ED     Status: None   Collection Time: 04/18/16  9:14 PM  Result Value Ref Range   Glucose-Capillary 70 65 - 99 mg/dL  CBG monitoring, ED     Status: Abnormal   Collection Time: 04/18/16 10:21 PM  Result Value Ref Range   Glucose-Capillary 100 (H) 65 - 99 mg/dL  Basic metabolic panel     Status:  Abnormal   Collection  Time: 04/19/16  4:52 AM  Result Value Ref Range   Sodium 140 135 - 145 mmol/L   Potassium 3.4 (L) 3.5 - 5.1 mmol/L   Chloride 105 101 - 111 mmol/L   CO2 24 22 - 32 mmol/L   Glucose, Bld 78 65 - 99 mg/dL   BUN 13 6 - 20 mg/dL   Creatinine, Ser 0.86 0.44 - 1.00 mg/dL   Calcium 8.5 (L) 8.9 - 10.3 mg/dL   GFR calc non Af Amer >60 >60 mL/min   GFR calc Af Amer >60 >60 mL/min    Comment: (NOTE) The eGFR has been calculated using the CKD EPI equation. This calculation has not been validated in all clinical situations. eGFR's persistently <60 mL/min signify possible Chronic Kidney Disease.    Anion gap 11 5 - 15  CBC     Status: Abnormal   Collection Time: 04/19/16 10:26 AM  Result Value Ref Range   WBC 6.3 4.0 - 10.5 K/uL   RBC 4.19 3.87 - 5.11 MIL/uL   Hemoglobin 11.4 (L) 12.0 - 15.0 g/dL   HCT 35.5 (L) 36.0 - 46.0 %   MCV 84.7 78.0 - 100.0 fL   MCH 27.2 26.0 - 34.0 pg   MCHC 32.1 30.0 - 36.0 g/dL   RDW 15.7 (H) 11.5 - 15.5 %   Platelets 190 150 - 400 K/uL  TSH     Status: Abnormal   Collection Time: 04/19/16 10:26 AM  Result Value Ref Range   TSH 0.301 (L) 0.350 - 4.500 uIU/mL  Protime-INR     Status: None   Collection Time: 04/19/16  1:08 PM  Result Value Ref Range   Prothrombin Time 14.9 11.6 - 15.2 seconds   INR 1.15 0.00 - 1.49  APTT     Status: Abnormal   Collection Time: 04/19/16  1:08 PM  Result Value Ref Range   aPTT 39 (H) 24 - 37 seconds    Comment:        IF BASELINE aPTT IS ELEVATED, SUGGEST PATIENT RISK ASSESSMENT BE USED TO DETERMINE APPROPRIATE ANTICOAGULANT THERAPY.   T4, free     Status: Abnormal   Collection Time: 04/19/16  1:08 PM  Result Value Ref Range   Free T4 1.48 (H) 0.61 - 1.12 ng/dL    MICRO: 5/17 blood cx ngtd IMAGING: Dg Chest 1 View  04/18/2016  CLINICAL DATA:  [place] today on rock walk weight at home, struck head, neck soreness, pain, history asthma, hypertension, COPD, GERD, multiple sclerosis, breast cancer,  Addison disease EXAM: CHEST 1 VIEW COMPARISON:  Chest radiograph 01/21/2014; interval CT chest 03/22/2015 FINDINGS: RIGHT jugular Port-A-Cath with tip projecting over SVC. Intraspinal stimulator projects at T7-T9. Normal heart size, mediastinal contours, and pulmonary vascularity. Bronchitic changes without pulmonary infiltrate, pleural effusion or pneumothorax. Bones demineralized. Post LEFT mastectomy and axillary node dissection. Post cervical spine fusion. Dextro convex thoracic scoliosis. IMPRESSION: Chronic bronchitic changes. No acute abnormalities. Electronically Signed   By: Lavonia Dana M.D.   On: 04/18/2016 20:56   Dg Cervical Spine Complete  04/18/2016  CLINICAL DATA:  Fall EXAM: CERVICAL SPINE - COMPLETE 4+ VIEW COMPARISON:  None ; correlation CT cervical spine 02/23/2010 FINDINGS: Examination performed supine in-collar. This may prevent identification of ligamentous and unstable injuries. Diffuse osseous demineralization. Anterior plate and screws at Q4-O9. Posterior plates and screws at C2-C3. Prevertebral soft tissues normal thickness. Vertebral body heights maintained. No gross evidence of fracture or subluxation identified on supine in-collar series. Suboptimal visualization of C1-C2  alignment. C7-T1 alignment inadequately visualized despite multiple swimmer's views. IMPRESSION: Osseous demineralization with postsurgical changes of anterior C5-C6 and posterior C2-C3 fusions. Nonvisualization of C7-T1 alignment. No definite acute bony abnormalities identified on supine in-collar cervical spine radiographs as discussed above. Electronically Signed   By: Lavonia Dana M.D.   On: 04/18/2016 21:00   Ct Head Wo Contrast  04/18/2016  CLINICAL DATA:  Multiple falls today, struck cheek on ground and struck LEFT side of head the first time, second time fell backwards and struck back of head, initial encounter EXAM: CT HEAD WITHOUT CONTRAST TECHNIQUE: Contiguous axial images were obtained from the base of  the skull through the vertex without intravenous contrast. COMPARISON:  02/28/2011 FINDINGS: Normal ventricular morphology. No midline shift or mass effect. Mild small vessel chronic ischemic changes of deep cerebral white matter. No intracranial hemorrhage, mass lesion, or evidence acute infarction. Probable tiny old lacunar infarcts RIGHT basal ganglia. No extra-axial fluid collections. Visualized paranasal sinuses and mastoid air cells clear. Skull intact. IMPRESSION: Small vessel chronic ischemic changes of deep cerebral white matter. Probable tiny old lacunar infarcts RIGHT basal ganglia. No acute intracranial abnormalities. Electronically Signed   By: Lavonia Dana M.D.   On: 04/18/2016 20:11    Assessment/Plan:  65yo F who presents with AMS, falls finishing a course of bactrim for folliculitis, which appears improved, though now has secondary rash which appears c/w leukocytoclastic vasculitis, possibly due to drug rash.  Folliculitis = recommend to hold off on further abtx. No need presently  AMS = - bactrim can also contribute to AMS in a few people, would not re-challenge ath this time since not compeltely clear how far off her baseline she maybe. Suspect still far from her baseline. Would avoid any sedating medication  Leukocytoclastic vasculitis- etiology of LV are numerous, could be a form of drug rash. Would hold off again on bactrim. Refer to dermatology if not improving or getting worse over the next week  Health maintenance = recommend check hcv ab  [facility] [phone]

## 2016-04-19 NOTE — Progress Notes (Signed)
This nurse and tech found 30 white (Hydroxyzine-K12) pills and 3 pink (Benadryl) pills noted in pts bed. When sheets were removed all 33 of the pills fell to the floor. Pt stated that her sister came up to visit her with 64 year old niece and her niece dropped her sister's pill bottle on the bed. She stated that she did not take any of the pills. Pt with no noted distress. Reported to charge nurse who witnessed waste. Will continue to monitor.

## 2016-04-19 NOTE — Progress Notes (Signed)
Initial Nutrition Assessment   INTERVENTION:  Monitor PO adequacy Provide Ensure Enlive po BID PRN, each supplement provides 350 kcal and 20 grams of protein   NUTRITION DIAGNOSIS:   Predicted suboptimal nutrient intake related to lethargy/confusion as evidenced by other (see comment) (per chart and family report).   GOAL:   Patient will meet greater than or equal to 90% of their needs   MONITOR:   PO intake, Labs, Weight trends, Skin  REASON FOR ASSESSMENT:   Malnutrition Screening Tool    ASSESSMENT:   65 y.o. female with medical history significant of HTN, hypothyroidism, asthma/COPD, reported multiple sclerosis, Addison's disease; who presents after a fall found to be acutely altered.   Pt lethargic at time of visit. Asked if she was eating well PTA which she nodded yes, but then she feel asleep. She appears well nourished with no signs of muscle or fat wasting. No recent weight. Per weight history she weighed 177 lbs in October of 2016. RD will monitor PO intake for adequacy.  Labs: low calcium  Diet Order:  Diet Heart Room service appropriate?: Yes; Fluid consistency:: Thin  Skin:  Reviewed, no issues  Last BM:  5/17  Height:   Ht Readings from Last 1 Encounters:  04/19/16 5\' 3"  (1.6 m)    Weight:   Wt Readings from Last 1 Encounters:  09/29/15 177 lb (80.287 kg)    Ideal Body Weight:  52.3 kg  BMI:  There is no weight on file to calculate BMI.  Estimated Nutritional Needs:   Kcal:  1500-1700  Protein:  80-95 grams  Fluid:  1.5-1.7 L/day  EDUCATION NEEDS:   No education needs identified at this time  [place], LDN Inpatient Clinical Dietitian Pager: [phone] After Hours Pager: [phone]

## 2016-04-19 NOTE — Progress Notes (Signed)
PROGRESS NOTE                                                                                                                                                                                                             Patient Demographics:    Daisy [name], is a 65 y.o. female, DOB - 1951/08/08, [id number]  Admit date - 04/18/2016   Admitting Physician Norval Morton, MD  Outpatient Primary MD for the patient is  Melinda Crutch, MD  LOS - 1  Outpatient Specialists: Dr Buddy Duty Endo  Chief Complaint  Patient presents with  . Fall  . Altered Mental Status       Brief Narrative    Daisy [name] is a 65 y.o. female with medical history significant of HTN, hypothyroidism, asthma/COPD, reported multiple sclerosis, Addison's disease; who presents after a fall found to be acutely altered. Per patient's husband she had been hallucinating at home talking to her deceased mother and seen family members in the home that are out of state. Husband states in the last 2 days she's had 2 falls. The most recent fall occurred last night when she tripped and fell hitting her head. Patient expresses the reason for her fall is that she had acute onset of "vertigo". Patient complained of headache in the occipital region. Patient was noted to have been changed changed to Bactrim on 5/10 by infectious disease at [facility] for this rash of her bilateral lower extremities thought to be bacterial folliculitis. Patient previously had been treated with Augmentin, doxycycline, clindamycin, and Mupirocin without relief of symptoms. Patient complains of itching related to the lesions. Husband denies any knowledge of of the patient having any recent fevers, chills, shortness of breath, chest pain, nausea, vomiting, abdominal pain, or diarrhea. She states that her multiple sclerosis had been previously treated with medication over 30 years ago, but she no longer  wanted to be on these medications.  ED Course: Upon admission into the emergency department patient was seen to be afebrile with vital signs all within normal limits. Lab work revealed WBC of 7.8, hemoglobin 11.7, platelets of 213, potassium 3.4, creatinine 1.02. CT scan of the brain showed chronic small vessel disease with no acute abnormalities, urinalysis unremarkable, and chest x-ray showed no acute abnormalities. Troponin hospitalists called to admit for further evaluation.   Subjective:  Daisy [name] today has, No headache, No chest pain, No abdominal pain - No Nausea, No new weakness tingling or numbness, No Cough - SOB.     Assessment  & Plan :    Principal Problem:   Acute encephalopathy Active Problems:   Addison's disease ([place])   Hypertension   Thyroid disease   Confusion   Hypokalemia   1.Encephalopathy. Likely due to side effect from Bactrim, much improved, continue to hold Bactrim, gentle hydration, supportive care. Increase activity if stable discharge in the morning. Head CT unremarkable.  2. Bilateral lower extremity petechial rash. Present for several months, does not look infected, has had multiple courses of antibiotics with multiple antibiotic allergies, we'll hold antibiotics, have requested ID to provide input. She has been told this is MRSA infection. She is due to see an ID physician and dermatologist at [facility]. Her platelet count is stable, will check PT/INR, if stable will ask her to see her local dermatologist within a week for biopsy and confirm diagnosis.  3. GERD. On BP.  4. Hypertension. On beta blocker.  5. Hypothyroidism. TSH slightly low, repeat if it continues to be low we'll drop Synthroid dose.  6. Anxiety. Low-dose Klonopin.  7. Hx of Addison's disease and possible pituitary insufficiency. On prednisone continue, if blood pressure drops will try stress dose steroids. Follows with Dr. Annamarie Dawley endocrinology.    Code Status :  Full  Family Communication  : None present  Disposition Plan  : Home in 1-2 days  Barriers For Discharge : Finish workup  Consults  :  ID  Procedures  :None  DVT Prophylaxis  :   Lovenox  Lab Results  Component Value Date   PLT 190 04/19/2016    Antibiotics  :     Anti-infectives    None        Objective:   Filed Vitals:   04/18/16 2330 04/19/16 0200 04/19/16 0700 04/19/16 0958  BP: 135/68 134/85 161/54 148/72  Pulse: 91 98 80 97  Temp:  99.7 F (37.6 C) 99.1 F (37.3 C) 98.9 F (37.2 C)  TempSrc:  Oral Oral Oral  Resp: 18 18 18 20   SpO2: 91% 94% 94% 92%    Wt Readings from Last 3 Encounters:  09/29/15 80.287 kg (177 lb)  09/24/15 80.287 kg (177 lb)  09/06/15 79.833 kg (176 lb)    No intake or output data in the 24 hours ending 04/19/16 1235   Physical Exam  Awake Alert, Oriented X 3, No new F.N deficits, Normal affect [place].AT,PERRAL Supple Neck,No JVD, No cervical lymphadenopathy appriciated.  Symmetrical Chest wall movement, Good air movement bilaterally, CTAB RRR,No Gallops,Rubs or new Murmurs, No Parasternal Heave +ve B.Sounds, Abd Soft, No tenderness, No organomegaly appriciated, No rebound - guarding or rigidity. No Cyanosis, Clubbing or edema, No new Rash or bruise , fine petechial rash on both lower extremities      Data Review:    CBC  Recent Labs Lab 04/18/16 2051 04/19/16 1026  WBC 7.8 6.3  HGB 11.7* 11.4*  HCT 35.0* 35.5*  PLT 213 190  MCV 84.1 84.7  MCH 28.1 27.2  MCHC 33.4 32.1  RDW 15.5 15.7*  LYMPHSABS 1.8  --   MONOABS 0.9  --   EOSABS 0.1  --   BASOSABS 0.1  --     Chemistries   Recent Labs Lab 04/18/16 2051 04/19/16 0452  NA 137 140  K 3.4* 3.4*  CL 103 105  CO2 25 24  GLUCOSE 86 78  BUN 15 13  CREATININE 1.02* 0.86  CALCIUM 8.7* 8.5*  AST 45*  --   ALT 23  --   ALKPHOS 49  --   BILITOT 0.6  --     ------------------------------------------------------------------------------------------------------------------ No results for input(s): CHOL, HDL, LDLCALC, TRIG, CHOLHDL, LDLDIRECT in the last 72 hours.  Lab Results  Component Value Date   HGBA1C 6.1* 10/17/2012   ------------------------------------------------------------------------------------------------------------------  Recent Labs  04/19/16 1026  TSH 0.301*   ------------------------------------------------------------------------------------------------------------------ No results for input(s): VITAMINB12, FOLATE, FERRITIN, TIBC, IRON, RETICCTPCT in the last 72 hours.  Coagulation profile No results for input(s): INR, PROTIME in the last 168 hours.  No results for input(s): DDIMER in the last 72 hours.  Cardiac Enzymes No results for input(s): CKMB, TROPONINI, MYOGLOBIN in the last 168 hours.  Invalid input(s): CK ------------------------------------------------------------------------------------------------------------------ No results found for: BNP  Inpatient Medications  Scheduled Meds: . B-complex with vitamin C  1 tablet Oral Daily  . clonazePAM  1 mg Oral TID  . enoxaparin (LOVENOX) injection  40 mg Subcutaneous Daily  . fluticasone furoate-vilanterol  1 puff Inhalation Daily  . [START ON 04/20/2016] levothyroxine  50 mcg Oral QODAY  . levothyroxine  75 mcg Oral QODAY  . metoprolol tartrate  12.5 mg Oral BID  . morphine  30 mg Oral BID  . nystatin  1 g Topical BID  . pantoprazole  40 mg Oral Daily  . predniSONE  4 mg Oral Q breakfast   Continuous Infusions: . sodium chloride 1,000 mL (04/19/16 0230)   PRN Meds:.acetaminophen **OR** acetaminophen, diphenhydrAMINE, hydrOXYzine, levalbuterol, ondansetron **OR** ondansetron (ZOFRAN) IV  Micro Results No results found for this or any previous visit (from the past 240 hour(s)).  Radiology Reports Dg Chest 1 View  04/18/2016  CLINICAL DATA:   [place] today on rock walk weight at home, struck head, neck soreness, pain, history asthma, hypertension, COPD, GERD, multiple sclerosis, breast cancer, Addison disease EXAM: CHEST 1 VIEW COMPARISON:  Chest radiograph 01/21/2014; interval CT chest 03/22/2015 FINDINGS: RIGHT jugular Port-A-Cath with tip projecting over SVC. Intraspinal stimulator projects at T7-T9. Normal heart size, mediastinal contours, and pulmonary vascularity. Bronchitic changes without pulmonary infiltrate, pleural effusion or pneumothorax. Bones demineralized. Post LEFT mastectomy and axillary node dissection. Post cervical spine fusion. Dextro convex thoracic scoliosis. IMPRESSION: Chronic bronchitic changes. No acute abnormalities. Electronically Signed   By: Lavonia Dana M.D.   On: 04/18/2016 20:56   Dg Cervical Spine Complete  04/18/2016  CLINICAL DATA:  Fall EXAM: CERVICAL SPINE - COMPLETE 4+ VIEW COMPARISON:  None ; correlation CT cervical spine 02/23/2010 FINDINGS: Examination performed supine in-collar. This may prevent identification of ligamentous and unstable injuries. Diffuse osseous demineralization. Anterior plate and screws at D34-534. Posterior plates and screws at C2-C3. Prevertebral soft tissues normal thickness. Vertebral body heights maintained. No gross evidence of fracture or subluxation identified on supine in-collar series. Suboptimal visualization of C1-C2 alignment. C7-T1 alignment inadequately visualized despite multiple swimmer's views. IMPRESSION: Osseous demineralization with postsurgical changes of anterior C5-C6 and posterior C2-C3 fusions. Nonvisualization of C7-T1 alignment. No definite acute bony abnormalities identified on supine in-collar cervical spine radiographs as discussed above. Electronically Signed   By: Lavonia Dana M.D.   On: 04/18/2016 21:00   Ct Head Wo Contrast  04/18/2016  CLINICAL DATA:  Multiple falls today, struck cheek on ground and struck LEFT side of head the first time, second time fell  backwards and struck back of head, initial encounter EXAM: CT HEAD WITHOUT CONTRAST TECHNIQUE:  Contiguous axial images were obtained from the base of the skull through the vertex without intravenous contrast. COMPARISON:  02/28/2011 FINDINGS: Normal ventricular morphology. No midline shift or mass effect. Mild small vessel chronic ischemic changes of deep cerebral white matter. No intracranial hemorrhage, mass lesion, or evidence acute infarction. Probable tiny old lacunar infarcts RIGHT basal ganglia. No extra-axial fluid collections. Visualized paranasal sinuses and mastoid air cells clear. Skull intact. IMPRESSION: Small vessel chronic ischemic changes of deep cerebral white matter. Probable tiny old lacunar infarcts RIGHT basal ganglia. No acute intracranial abnormalities. Electronically Signed   By: Lavonia Dana M.D.   On: 04/18/2016 20:11    Time Spent in minutes  30   Ivaan Liddy K M.D on 04/19/2016 at 12:35 PM  Between 7am to 7pm - Pager - [phone]  After 7pm go to [email or web address] - password [facility]  [facility] -  Office  [phone]

## 2016-04-19 NOTE — Care Management Note (Signed)
Case Management Note  Patient Details  Name: Daisy [name] MRN: [id number] Date of Birth: 21-Oct-1951  Subjective/Objective:    Pt in with confusion and falls. CT head negative. She is from home with husband.                 Action/Plan: Continue medical work up. CM following for discharge needs.   Expected Discharge Date:                  Expected Discharge Plan:  Home/Self Care  In-House Referral:     Discharge planning Services     Post Acute Care Choice:    Choice offered to:     DME Arranged:    DME Agency:     HH Arranged:    HH Agency:     Status of Service:  In process, will continue to follow  Medicare Important Message Given:    Date Medicare IM Given:    Medicare IM give by:    Date Additional Medicare IM Given:    Additional Medicare Important Message give by:     If discussed at [place] of Stay Meetings, dates discussed:    Additional Comments:  Pollie Friar, RN 04/19/2016, 10:42 AM

## 2016-04-19 NOTE — Progress Notes (Signed)
Patient arrived around 0230 during system down time, patient alert and oriented but somewhat confused, not able to transfer patient into system as ED is not releasing patient to this floor at this time. Patient just transfered to this floor orders released.

## 2016-04-20 DIAGNOSIS — G9340 Encephalopathy, unspecified: Secondary | ICD-10-CM

## 2016-04-20 DIAGNOSIS — R410 Disorientation, unspecified: Secondary | ICD-10-CM

## 2016-04-20 DIAGNOSIS — E079 Disorder of thyroid, unspecified: Secondary | ICD-10-CM

## 2016-04-20 DIAGNOSIS — I10 Essential (primary) hypertension: Secondary | ICD-10-CM

## 2016-04-20 DIAGNOSIS — E271 Primary adrenocortical insufficiency: Secondary | ICD-10-CM | POA: Diagnosis not present

## 2016-04-20 LAB — BASIC METABOLIC PANEL
ANION GAP: 11 (ref 5–15)
BUN: <5 mg/dL — ABNORMAL LOW (ref 6–20)
CHLORIDE: 104 mmol/L (ref 101–111)
CO2: 23 mmol/L (ref 22–32)
CREATININE: 0.72 mg/dL (ref 0.44–1.00)
Calcium: 8.5 mg/dL — ABNORMAL LOW (ref 8.9–10.3)
GFR calc non Af Amer: >60 mL/min (ref >60)
Glucose, Bld: 78 mg/dL (ref 65–99)
POTASSIUM: 3.9 mmol/L (ref 3.5–5.1)
SODIUM: 138 mmol/L (ref 135–145)

## 2016-04-20 LAB — CBC
HCT: 36.9 % (ref 36.0–46.0)
HEMOGLOBIN: 12 g/dL (ref 12.0–15.0)
MCH: 27.5 pg (ref 26.0–34.0)
MCHC: 32.5 g/dL (ref 30.0–36.0)
MCV: 84.6 fL (ref 78.0–100.0)
Platelets: 227 10*3/uL (ref 150–400)
RBC: 4.36 MIL/uL (ref 3.87–5.11)
RDW: 16.1 % — ABNORMAL HIGH (ref 11.5–15.5)
WBC: 5.3 10*3/uL (ref 4.0–10.5)

## 2016-04-20 LAB — T3: T3 TOTAL: 95 ng/dL (ref 71–180)

## 2016-04-20 LAB — TSH: TSH: 0.125 u[IU]/mL — AB (ref 0.350–4.500)

## 2016-04-20 MED ORDER — LEVOTHYROXINE SODIUM 50 MCG PO TABS
50.0000 ug | ORAL_TABLET | Freq: Every day | ORAL | Status: DC
  Administered 2016-04-21: 50 ug via ORAL
  Filled 2016-04-20: qty 1

## 2016-04-20 MED ORDER — CLONAZEPAM 0.5 MG PO TABS
0.5000 mg | ORAL_TABLET | Freq: Three times a day (TID) | ORAL | Status: DC
  Administered 2016-04-20 – 2016-04-21 (×3): 0.5 mg via ORAL
  Filled 2016-04-20 (×3): qty 1

## 2016-04-20 MED ORDER — SODIUM CHLORIDE 0.9% FLUSH: 10.0000 mL | INTRAVENOUS | Status: DC | PRN

## 2016-04-20 NOTE — Progress Notes (Signed)
Pt reports she is getting sores on her tongue, rn assessed pts mouth, no sores noted, but possibly patches on back of tongue. md made aware.

## 2016-04-20 NOTE — Progress Notes (Signed)
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 But can hold and have an appropriate conversation.

## 2016-04-20 NOTE — Progress Notes (Signed)
PROGRESS NOTE                                                                                                                                                                                                             Patient Demographics:    Daisy [name], is a 65 y.o. female, DOB - Nov 18, 1951, [id number]  Admit date - 04/18/2016   Admitting Physician Norval Morton, MD  Outpatient Primary MD for the patient is  Melinda Crutch, MD  LOS - 2  Outpatient Specialists: Dr Buddy Duty Endo  Chief Complaint  Patient presents with  . Fall  . Altered Mental Status       Brief Narrative    Daisy [name] is a 65 y.o. female with medical history significant of HTN, hypothyroidism, asthma/COPD, reported multiple sclerosis, Addison's disease; who presents after a fall found to be acutely altered. Per patient's husband she had been hallucinating at home talking to her deceased mother and seen family members in the home that are out of state. Husband states in the last 2 days she's had 2 falls. The most recent fall occurred last night when she tripped and fell hitting her head. Patient expresses the reason for her fall is that she had acute onset of "vertigo". Patient complained of headache in the occipital region. Patient was noted to have been changed changed to Bactrim on 5/10 by infectious disease at [facility] for this rash of her bilateral lower extremities thought to be bacterial folliculitis. Patient previously had been treated with Augmentin, doxycycline, clindamycin, and Mupirocin without relief of symptoms. Patient complains of itching related to the lesions. Husband denies any knowledge of of the patient having any recent fevers, chills, shortness of breath, chest pain, nausea, vomiting, abdominal pain, or diarrhea. She states that her multiple sclerosis had been previously treated with medication over 30 years ago, but she no longer  wanted to be on these medications.  ED Course: Upon admission into the emergency department patient was seen to be afebrile with vital signs all within normal limits. Lab work revealed WBC of 7.8, hemoglobin 11.7, platelets of 213, potassium 3.4, creatinine 1.02. CT scan of the brain showed chronic small vessel disease with no acute abnormalities, urinalysis unremarkable, and chest x-ray showed no acute abnormalities. Troponin hospitalists called to admit for further evaluation.   Subjective:  Appears to have memory issues-- short term-- asking same questions over and over   Assessment  & Plan :    Principal Problem:   Acute encephalopathy Active Problems:   Addison's disease ([place])   Hypertension   Thyroid disease   Confusion   Hypokalemia   1.Encephalopathy. Likely due to side effect from Bactrim -improved, continue to hold Bactrim, gentle hydration, supportive care.   Head CT unremarkable. -hope to d/c in AM  2.   Bilateral lower extremity petechial rash. Present for several months, does not look infected, has had multiple courses of antibiotics with multiple antibiotic allergies, we'll hold antibiotics, have requested ID to provide input. She has been told this is MRSA infection. She is due to see an ID physician and dermatologist at [facility]. Her platelet count is stable, will check PT/INR, if stable will ask her to see her local dermatologist within a week for biopsy and confirm diagnosis.  3. GERD. On BP.  4. Hypertension. On beta blocker.  5. Hypothyroidism. TSH slightly low -decrease synthroid and follow as outpatient  6. Anxiety. Low-dose Klonopin.  7. Hx of Addison's disease and possible pituitary insufficiency. On prednisone continue, if blood pressure drops will try stress dose steroids. Follows with Dr. Annamarie Dawley endocrinology.    Code Status : Full  Family Communication  : None present  Disposition Plan  : Home in AM?   Consults  :  ID  Procedures   :None  DVT Prophylaxis  :   Lovenox  Lab Results  Component Value Date   PLT 227 04/20/2016    Antibiotics  :     Anti-infectives    None        Objective:   Filed Vitals:   04/20/16 0610 04/20/16 0921 04/20/16 1343 04/20/16 1400  BP: 160/74 147/76 166/63   Pulse: 74 79 74   Temp: 98.1 F (36.7 C) 98.8 F (37.1 C) 98.2 F (36.8 C)   TempSrc: Oral Oral Oral   Resp: 20 20 20    Height:    5\' 1"  (1.549 m)  Weight:    82.555 kg (182 lb)  SpO2: 97% 98% 96%     Wt Readings from Last 3 Encounters:  04/20/16 82.555 kg (182 lb)  09/29/15 80.287 kg (177 lb)  09/24/15 80.287 kg (177 lb)    No intake or output data in the 24 hours ending 04/20/16 1701   Physical Exam  Sleepy today but will awaken and answer questions but falls back asleep very easy Symmetrical Chest wall movement, Good air movement bilaterally, CTAB RRR,No Gallops,Rubs or new Murmurs, No Parasternal Heave +ve B.Sounds, Abd Soft, No tenderness, No organomegaly appriciated, No rebound - guarding or rigidity. No Cyanosis, Clubbing or edema, No new Rash or bruise , fine petechial rash on both lower extremities      Data Review:    CBC  Recent Labs Lab 04/18/16 2051 04/19/16 1026 04/20/16 0739  WBC 7.8 6.3 5.3  HGB 11.7* 11.4* 12.0  HCT 35.0* 35.5* 36.9  PLT 213 190 227  MCV 84.1 84.7 84.6  MCH 28.1 27.2 27.5  MCHC 33.4 32.1 32.5  RDW 15.5 15.7* 16.1*  LYMPHSABS 1.8  --   --   MONOABS 0.9  --   --   EOSABS 0.1  --   --   BASOSABS 0.1  --   --     Chemistries   Recent Labs Lab 04/18/16 2051 04/19/16 0452 04/20/16 0739  NA 137 140 138  K 3.4* 3.4* 3.9  CL 103 105 104  CO2 25 24 23   GLUCOSE 86 78 78  BUN 15 13 <5*  CREATININE 1.02* 0.86 0.72  CALCIUM 8.7* 8.5* 8.5*  AST 45*  --   --   ALT 23  --   --   ALKPHOS 49  --   --   BILITOT 0.6  --   --    ------------------------------------------------------------------------------------------------------------------ No results  for input(s): CHOL, HDL, LDLCALC, TRIG, CHOLHDL, LDLDIRECT in the last 72 hours.  Lab Results  Component Value Date   HGBA1C 6.1* 10/17/2012   ------------------------------------------------------------------------------------------------------------------  Recent Labs  04/20/16 0749  TSH 0.125*   ------------------------------------------------------------------------------------------------------------------ No results for input(s): VITAMINB12, FOLATE, FERRITIN, TIBC, IRON, RETICCTPCT in the last 72 hours.  Coagulation profile  Recent Labs Lab 04/19/16 1308  INR 1.15    No results for input(s): DDIMER in the last 72 hours.  Cardiac Enzymes No results for input(s): CKMB, TROPONINI, MYOGLOBIN in the last 168 hours.  Invalid input(s): CK ------------------------------------------------------------------------------------------------------------------ No results found for: BNP  Inpatient Medications  Scheduled Meds: . B-complex with vitamin C  1 tablet Oral Daily  . clonazePAM  0.5 mg Oral TID  . enoxaparin (LOVENOX) injection  40 mg Subcutaneous Daily  . fluticasone furoate-vilanterol  1 puff Inhalation Daily  . levothyroxine  50 mcg Oral QODAY  . levothyroxine  75 mcg Oral QODAY  . metoprolol tartrate  12.5 mg Oral BID  . morphine  30 mg Oral BID  . nystatin  1 g Topical BID  . pantoprazole  40 mg Oral Daily  . predniSONE  4 mg Oral Q breakfast   Continuous Infusions:   PRN Meds:.acetaminophen **OR** [DISCONTINUED] acetaminophen, diphenhydrAMINE, feeding supplement (ENSURE ENLIVE), hydrOXYzine, levalbuterol, [DISCONTINUED] ondansetron **OR** ondansetron (ZOFRAN) IV, sodium chloride flush  Micro Results Recent Results (from the past 240 hour(s))  Culture, blood (routine x 2)     Status: None (Preliminary result)   Collection Time: 04/19/16 10:30 AM  Result Value Ref Range Status   Specimen Description BLOOD RIGHT FOOT  Final   Special Requests IN PEDIATRIC  BOTTLE [place]  Final   Culture NO GROWTH 1 DAY  Final   Report Status PENDING  Incomplete  Culture, blood (routine x 2)     Status: None (Preliminary result)   Collection Time: 04/19/16 10:35 AM  Result Value Ref Range Status   Specimen Description BLOOD LEFT FOOT  Final   Special Requests IN PEDIATRIC BOTTLE [place]  Final   Culture NO GROWTH 1 DAY  Final   Report Status PENDING  Incomplete    Radiology Reports Dg Chest 1 View  04/18/2016  CLINICAL DATA:  [place] today on rock walk weight at home, struck head, neck soreness, pain, history asthma, hypertension, COPD, GERD, multiple sclerosis, breast cancer, Addison disease EXAM: CHEST 1 VIEW COMPARISON:  Chest radiograph 01/21/2014; interval CT chest 03/22/2015 FINDINGS: RIGHT jugular Port-A-Cath with tip projecting over SVC. Intraspinal stimulator projects at T7-T9. Normal heart size, mediastinal contours, and pulmonary vascularity. Bronchitic changes without pulmonary infiltrate, pleural effusion or pneumothorax. Bones demineralized. Post LEFT mastectomy and axillary node dissection. Post cervical spine fusion. Dextro convex thoracic scoliosis. IMPRESSION: Chronic bronchitic changes. No acute abnormalities. Electronically Signed   By: Lavonia Dana M.D.   On: 04/18/2016 20:56   Dg Cervical Spine Complete  04/18/2016  CLINICAL DATA:  Fall EXAM: CERVICAL SPINE - COMPLETE 4+ VIEW COMPARISON:  None ; correlation CT cervical spine 02/23/2010 FINDINGS: Examination performed supine in-collar. This  may prevent identification of ligamentous and unstable injuries. Diffuse osseous demineralization. Anterior plate and screws at D34-534. Posterior plates and screws at C2-C3. Prevertebral soft tissues normal thickness. Vertebral body heights maintained. No gross evidence of fracture or subluxation identified on supine in-collar series. Suboptimal visualization of C1-C2 alignment. C7-T1 alignment inadequately visualized despite multiple swimmer's views. IMPRESSION: Osseous  demineralization with postsurgical changes of anterior C5-C6 and posterior C2-C3 fusions. Nonvisualization of C7-T1 alignment. No definite acute bony abnormalities identified on supine in-collar cervical spine radiographs as discussed above. Electronically Signed   By: Lavonia Dana M.D.   On: 04/18/2016 21:00   Ct Head Wo Contrast  04/18/2016  CLINICAL DATA:  Multiple falls today, struck cheek on ground and struck LEFT side of head the first time, second time fell backwards and struck back of head, initial encounter EXAM: CT HEAD WITHOUT CONTRAST TECHNIQUE: Contiguous axial images were obtained from the base of the skull through the vertex without intravenous contrast. COMPARISON:  02/28/2011 FINDINGS: Normal ventricular morphology. No midline shift or mass effect. Mild small vessel chronic ischemic changes of deep cerebral white matter. No intracranial hemorrhage, mass lesion, or evidence acute infarction. Probable tiny old lacunar infarcts RIGHT basal ganglia. No extra-axial fluid collections. Visualized paranasal sinuses and mastoid air cells clear. Skull intact. IMPRESSION: Small vessel chronic ischemic changes of deep cerebral white matter. Probable tiny old lacunar infarcts RIGHT basal ganglia. No acute intracranial abnormalities. Electronically Signed   By: Lavonia Dana M.D.   On: 04/18/2016 20:11    Time Spent in minutes  Darling DO on 04/20/2016 at 5:01 PM  Between 7am to 7pm - Pager - [phone]  After 7pm go to [email or web address] - password [facility]  [facility] -  Office  [phone]

## 2016-04-20 NOTE — Progress Notes (Signed)
Pt has been putting vaseline on her entire face, because she does not like dry skin. Pt is not sweating.

## 2016-04-20 NOTE — Progress Notes (Signed)
Pt moved to room 5M1, pt still unhappy about moving rooms. Husband transferred pts personal belongings to new room.

## 2016-04-20 NOTE — Progress Notes (Signed)
Husband was in charge of transferring pts personal belongings. Pt asking where her comb is, rn said she was unsure, but if it was left in the other room it may have been thrown away when room was cleaned. Pt given a comb from supplies.

## 2016-04-20 NOTE — Progress Notes (Addendum)
Pt is not happy she is being moved to a camera room. rn explained that it is due to pt being confused intermittently throughout the day and so that staff can better monitor her.   Explained that normally pts with confusion are placed in camera rooms, one came available so that is why pt is being moved to that room now. Husband at bedside and agreeable to pt being moved.

## 2016-04-20 NOTE — Progress Notes (Signed)
rn assisted pt to the bathroom, with walker. Pt does not want to wear gait belt because it hits the spot on her right ribs that hurts.   While pt in bathroom. rn checked bed, no medications seen/found in bed.

## 2016-04-20 NOTE — Progress Notes (Signed)
Pt falling asleep during recent conversation, md made aware that rn was going to give afternoon dose of klonopin, pt did not receive morning dose due to drowsiness, but pt reports she takes klonopin at home and rn does not want pt going into withdrawal.

## 2016-04-20 NOTE — Progress Notes (Addendum)
Pt has BIL restricted extremity arm bands, per night shift nurse and chart pt hx of lumpectomy L arm and mastectomy on R arm.  Pt told night shift she was allowed to get BP readings taken on left arm per her doctor  Per chart H&P left lumpectomy 1993 and right mastectomy 2015  Will cut off restricted extremity arm band on left arm.  Pt has IV in left forearm.   rn informed phlebotomy they could draw blood in left arm.

## 2016-04-21 DIAGNOSIS — E271 Primary adrenocortical insufficiency: Secondary | ICD-10-CM | POA: Diagnosis not present

## 2016-04-21 DIAGNOSIS — I10 Essential (primary) hypertension: Secondary | ICD-10-CM | POA: Diagnosis not present

## 2016-04-21 DIAGNOSIS — G9340 Encephalopathy, unspecified: Secondary | ICD-10-CM | POA: Diagnosis not present

## 2016-04-21 DIAGNOSIS — E079 Disorder of thyroid, unspecified: Secondary | ICD-10-CM | POA: Diagnosis not present

## 2016-04-21 LAB — HEPATITIS C ANTIBODY: HCV AB: 0.2 {s_co_ratio} (ref 0.0–0.9)

## 2016-04-21 MED ORDER — HEPARIN SOD (PORK) LOCK FLUSH 100 UNIT/ML IV SOLN
500.0000 [IU] | INTRAVENOUS | Status: AC | PRN
  Administered 2016-04-21: 500 [IU]

## 2016-04-21 NOTE — Progress Notes (Signed)
Patient complaining of restless legs notified [facility] on call for possible medication to relive, will continue to monitor.

## 2016-04-21 NOTE — Evaluation (Signed)
Physical Therapy Evaluation Patient Details Name: Daisy [name] MRN: [id number] DOB: [birth year] Today's Date: 04/21/2016   History of Present Illness  65 yo female with onset of AMS and fall at home, has hit her head and had acute encephalopathy.  PMHx:  pt report of MS, Addision's disease, lumbar fracture, cervical spine fusions C2-3, C5-6  Clinical Impression  Pt was seen for evaluation of her gait and balance, with limited help can safely navigate steps and walking on RW.  Will need her family to be available to her for a time to ensure safety and can decrease assistance as possible as she is more controlled and less likely to fall.      Follow Up Recommendations Home health PT;Supervision/Assistance - 24 hour (scaling to PRN as pt safely can)    Equipment Recommendations  None recommended by PT (has RW at home)    Recommendations for Other Services Rehab consult     Precautions / Restrictions Precautions Precautions: Fall Restrictions Weight Bearing Restrictions: No      Mobility  Bed Mobility Overal bed mobility: Needs Assistance Bed Mobility: Supine to Sit;Sit to Supine     Supine to sit: Min assist Sit to supine: Min assist   General bed mobility comments: reminders for hand placement  Transfers Overall transfer level: Modified independent Equipment used: Rolling walker (2 wheeled)             General transfer comment: cued hand placement  Ambulation/Gait Ambulation/Gait assistance: Min guard Ambulation Distance (Feet): 40 Feet Assistive device: Rolling walker (2 wheeled) Gait Pattern/deviations: Step-through pattern;Decreased stride length;Narrow base of support Gait velocity: reduced Gait velocity interpretation: Below normal speed for age/gender    Stairs Stairs: Yes Stairs assistance: Min assist Stair Management: Two rails;Alternating pattern;Forwards Number of Stairs: 5 General stair comments: pt was hesitating but able to handle steps with  guarding  Wheelchair Mobility    Modified Rankin (Stroke Patients Only)       Balance Overall balance assessment: Needs assistance Sitting-balance support: Feet supported Sitting balance-Leahy Scale: Good     Standing balance support: Bilateral upper extremity supported Standing balance-Leahy Scale: Fair                               Pertinent Vitals/Pain Pain Assessment: Faces Faces Pain Scale: Hurts little more Pain Location: B knees, trunk head, forehead Pain Descriptors / Indicators: Aching Pain Intervention(s): Monitored during session;Premedicated before session;Repositioned    Home Living Family/patient expects to be discharged to:: Private residence Living Arrangements: Spouse/significant other Available Help at Discharge: Family;Available PRN/intermittently (for the weekend has help arranged) Type of Home: House Home Access: Stairs to enter Entrance Stairs-Rails: Right;Left;Can reach both Entrance Stairs-Number of Steps: 5 Home Layout: One level Home Equipment: Walker - 2 wheels;Cane - single point Additional Comments: Pt has been using a walker due to recent falls    Prior Function Level of Independence: Independent with assistive device(s)         Comments: MS is type per pt that does not have remissions     Hand Dominance        Extremity/Trunk Assessment   Upper Extremity Assessment: Overall WFL for tasks assessed           Lower Extremity Assessment: Overall WFL for tasks assessed      Cervical / Trunk Assessment: Normal  Communication   Communication: No difficulties  Cognition Arousal/Alertness: Awake/alert Behavior During Therapy: Anxious Overall Cognitive  Status: No family/caregiver present to determine baseline cognitive functioning       Memory: Decreased recall of precautions              General Comments General comments (skin integrity, edema, etc.): Pt was able to maneuver to chair, then BR and  stairs with PT on walker.  Will need family to be available for at least initial trip home, then can withdraw continual availability as able    Exercises        Assessment/Plan    PT Assessment Patient needs continued PT services  PT Diagnosis Abnormality of gait   PT Problem List Decreased strength;Decreased range of motion;Decreased activity tolerance;Decreased balance;Decreased mobility;Decreased coordination;Decreased knowledge of use of DME;Decreased safety awareness;Pain;Decreased skin integrity  PT Treatment Interventions DME instruction;Gait training;Stair training;Therapeutic activities;Functional mobility training;Therapeutic exercise;Balance training;Neuromuscular re-education;Patient/family education   PT Goals (Current goals can be found in the Care Plan section) Acute Rehab PT Goals Patient Stated Goal: to get up the stairs for home and feel less painful PT Goal Formulation: With patient Time For Goal Achievement: 05/05/16 Potential to Achieve Goals: Good    Frequency Min 2X/week   Barriers to discharge Inaccessible home environment;Decreased caregiver support (home with family at work, has 5 steps to enter house) Pt climbed steps with PT    Co-evaluation               End of Session Equipment Utilized During Treatment: Gait belt Activity Tolerance: Patient tolerated treatment well;Patient limited by fatigue Patient left: in bed;with call bell/phone within reach;with bed alarm set Nurse Communication: Mobility status    Functional Assessment Tool Used: clinical judgment Functional Limitation: Mobility: Walking and moving around Mobility: Walking and Moving Around Current Status [id number]): At least 20 percent but less than 40 percent impaired, limited or restricted Mobility: Walking and Moving Around Goal Status [phone]): 0 percent impaired, limited or restricted    Time: 1309-1350 PT Time Calculation (min) (ACUTE ONLY): 41 min   Charges:   PT  Evaluation $PT Eval Moderate Complexity: 1 Procedure PT Treatments $Gait Training: 8-22 mins $Therapeutic Activity: 8-22 mins   PT G Codes:   PT G-Codes **NOT FOR INPATIENT CLASS** Functional Assessment Tool Used: clinical judgment Functional Limitation: Mobility: Walking and moving around Mobility: Walking and Moving Around Current Status [id number]): At least 20 percent but less than 40 percent impaired, limited or restricted Mobility: Walking and Moving Around Goal Status [phone]): 0 percent impaired, limited or restricted    Ramond Dial 04/21/2016, 2:43 PM    Mee Hives, PT MS Acute Rehab Dept. Number: [place] and [place]

## 2016-04-21 NOTE — Progress Notes (Signed)
Patient is discharged from room 5M01 at this time. Alert and in stable condition. IV site d/c'd as well as tele. Instructions read to patient with understanding verbalized. Left unit via wheelchair with all belongings at side.

## 2016-04-21 NOTE — Care Management Note (Signed)
Case Management Note  Patient Details  Name: Daisy [name] MRN: [id number] Date of Birth: 1951-04-09  Subjective/Objective:                    Action Plan: CM met with patient to discuss discharge planning. Patient states that her husband will be working from home for 4-6 weeks and will be available to provide physical assistance, as needed.  They currently do not feel that they need home health services. CM encouraged patient to contact her PCP should any needs arise after discharge.  Bedside RN was updated and is aware that patient's husband will be arriving shortly for discharge.     Expected Discharge Date:                  Expected Discharge Plan:  Home/Self Care  In-House Referral:     Discharge planning Services     Post Acute Care Choice:    Choice offered to:     DME Arranged:    DME Agency:     HH Arranged:    [place] Agency:     Status of Service:  Completed, signed off  Medicare Important Message Given:    Date Medicare IM Given:    Medicare IM give by:    Date Additional Medicare IM Given:    Additional Medicare Important Message give by:     If discussed at [place] of Stay Meetings, dates discussed:    Additional CommentsRolm Baptise, RN 04/21/2016, 4:04 PM [phone]

## 2016-04-21 NOTE — Discharge Summary (Signed)
Physician Discharge Summary  KALANY SHIPMAN [id number] DOB: [birth year] DOA: 04/18/2016  PCP:  Melinda Crutch, MD  Admit date: 04/18/2016 Discharge date: 04/21/2016   Recommendations for Outpatient Follow-Up:   Keep appointment with [facility]- derm-- most likely will need biopsy-- suspect vasculitis TSH as outpatient 6 weeks  Discharge Diagnosis:   Principal Problem:   Acute encephalopathy Active Problems:   Addison's disease ([place])   Hypertension   Thyroid disease   Confusion   Hypokalemia   Discharge disposition:  Home with supervision  Discharge Condition: Improved.  Diet recommendation: Low sodium, heart healthy  Wound care: None.   History of Present Illness:   Daisy [name] is a 65 y.o. female with medical history significant of HTN, hypothyroidism, asthma/COPD, reported multiple sclerosis, Addison's disease; who presents after a fall found to be acutely altered. Per patient's husband she had been hallucinating at home talking to her deceased mother and seen family members in the home that are out of state. Husband states in the last 2 days she's had 2 falls. The most recent fall occurred last night when she tripped and fell hitting her head. Patient expresses the reason for her fall is that she had acute onset of "vertigo". Patient complained of headache in the occipital region. Patient was noted to have been changed changed to Bactrim on 5/10 by infectious disease at [facility] for this rash of her bilateral lower extremities thought to be bacterial folliculitis. Patient previously had been treated with Augmentin, doxycycline, clindamycin, and Mupirocin without relief of symptoms. Patient complains of itching related to the lesions. Husband denies any knowledge of of the patient having any recent fevers, chills, shortness of breath, chest pain, nausea, vomiting, abdominal pain, or diarrhea. She states that her multiple sclerosis had been previously treated with medication  over 30 years ago, but she no longer wanted to be on these medications.  ED Course: Upon admission into the emergency department patient was seen to be afebrile with vital signs all within normal limits. Lab work revealed WBC of 7.8, hemoglobin 11.7, platelets of 213, potassium 3.4, creatinine 1.02. CT scan of the brain showed chronic small vessel disease with no acute abnormalities, urinalysis unremarkable, and chest x-ray showed no acute abnormalities. Troponin hospitalists called to admit for further evaluation.   Hospital Course by Problem:   Encephalopathy. Likely due to side effect from Bactrim vs overmedication with pain meds/benadryl -improved, d/c bactrim Head CT unremarkable.   Bilateral lower extremity petechial rash. Present for several months, does not look infected,  -suspect a small vessel vasculitis-- send to [place] derm for biopsy  Hypertension. On beta blocker.  Hypothyroidism. TSH slightly low -decrease synthroid and follow as outpatient   Anxiety. Low-dose Klonopin.  Hx of Addison's disease and possible pituitary insufficiency.   Follows with Dr. Annamarie Dawley endocrinology.    Medical Consultants:    ID   Discharge Exam:   Filed Vitals:   04/21/16 0622 04/21/16 0851  BP: 142/69 156/67  Pulse: 74 75  Temp: 98.3 F (36.8 C) 98.5 F (36.9 C)  Resp: 16 20   Filed Vitals:   04/20/16 2323 04/21/16 0300 04/21/16 0622 04/21/16 0851  BP:  140/59 142/69 156/67  Pulse: 80 69 74 75  Temp: 97.7 F (36.5 C) 98.5 F (36.9 C) 98.3 F (36.8 C) 98.5 F (36.9 C)  TempSrc: Oral Oral Oral Oral  Resp: 18 16 16 20   Height:      Weight:      SpO2: 99%  96% 100% 98%    Gen:  NAD- much more awake once placed in camera room    The results of significant diagnostics from this hospitalization (including imaging, microbiology, ancillary and laboratory) are listed below for reference.     Procedures and Diagnostic Studies:   Dg Chest 1 View  04/18/2016  CLINICAL  DATA:  [place] today on rock walk weight at home, struck head, neck soreness, pain, history asthma, hypertension, COPD, GERD, multiple sclerosis, breast cancer, Addison disease EXAM: CHEST 1 VIEW COMPARISON:  Chest radiograph 01/21/2014; interval CT chest 03/22/2015 FINDINGS: RIGHT jugular Port-A-Cath with tip projecting over SVC. Intraspinal stimulator projects at T7-T9. Normal heart size, mediastinal contours, and pulmonary vascularity. Bronchitic changes without pulmonary infiltrate, pleural effusion or pneumothorax. Bones demineralized. Post LEFT mastectomy and axillary node dissection. Post cervical spine fusion. Dextro convex thoracic scoliosis. IMPRESSION: Chronic bronchitic changes. No acute abnormalities. Electronically Signed   By: Lavonia Dana M.D.   On: 04/18/2016 20:56   Dg Cervical Spine Complete  04/18/2016  CLINICAL DATA:  Fall EXAM: CERVICAL SPINE - COMPLETE 4+ VIEW COMPARISON:  None ; correlation CT cervical spine 02/23/2010 FINDINGS: Examination performed supine in-collar. This may prevent identification of ligamentous and unstable injuries. Diffuse osseous demineralization. Anterior plate and screws at [id number]. Posterior plates and screws at C2-C3. Prevertebral soft tissues normal thickness. Vertebral body heights maintained. No gross evidence of fracture or subluxation identified on supine in-collar series. Suboptimal visualization of C1-C2 alignment. C7-T1 alignment inadequately visualized despite multiple swimmer's views. IMPRESSION: Osseous demineralization with postsurgical changes of anterior C5-C6 and posterior C2-C3 fusions. Nonvisualization of C7-T1 alignment. No definite acute bony abnormalities identified on supine in-collar cervical spine radiographs as discussed above. Electronically Signed   By: Lavonia Dana M.D.   On: 04/18/2016 21:00   Ct Head Wo Contrast  04/18/2016  CLINICAL DATA:  Multiple falls today, struck cheek on ground and struck LEFT side of head the first time, second  time fell backwards and struck back of head, initial encounter EXAM: CT HEAD WITHOUT CONTRAST TECHNIQUE: Contiguous axial images were obtained from the base of the skull through the vertex without intravenous contrast. COMPARISON:  02/28/2011 FINDINGS: Normal ventricular morphology. No midline shift or mass effect. Mild small vessel chronic ischemic changes of deep cerebral white matter. No intracranial hemorrhage, mass lesion, or evidence acute infarction. Probable tiny old lacunar infarcts RIGHT basal ganglia. No extra-axial fluid collections. Visualized paranasal sinuses and mastoid air cells clear. Skull intact. IMPRESSION: Small vessel chronic ischemic changes of deep cerebral white matter. Probable tiny old lacunar infarcts RIGHT basal ganglia. No acute intracranial abnormalities. Electronically Signed   By: Lavonia Dana M.D.   On: 04/18/2016 20:11     Labs:   Basic Metabolic Panel:  Recent Labs Lab 04/18/16 2051 04/19/16 0452 04/20/16 0739  NA 137 140 138  K 3.4* 3.4* 3.9  CL 103 105 104  CO2 25 24 23   GLUCOSE 86 78 78  BUN 15 13 <5*  CREATININE 1.02* 0.86 0.72  CALCIUM 8.7* 8.5* 8.5*   GFR Estimated Creatinine Clearance: 70.1 mL/min (by C-G formula based on Cr of 0.72). Liver Function Tests:  Recent Labs Lab 04/18/16 2051  AST 45*  ALT 23  ALKPHOS 49  BILITOT 0.6  PROT 6.3*  ALBUMIN 3.7   No results for input(s): LIPASE, AMYLASE in the last 168 hours.  Recent Labs Lab 04/18/16 2059  AMMONIA 16   Coagulation profile  Recent Labs Lab 04/19/16 1308  INR 1.15  CBC:  Recent Labs Lab 04/18/16 2051 04/19/16 1026 04/20/16 0739  WBC 7.8 6.3 5.3  NEUTROABS 5.0  --   --   HGB 11.7* 11.4* 12.0  HCT 35.0* 35.5* 36.9  MCV 84.1 84.7 84.6  PLT 213 190 227   Cardiac Enzymes: No results for input(s): CKTOTAL, CKMB, CKMBINDEX, TROPONINI in the last 168 hours. BNP: Invalid input(s): POCBNP CBG:  Recent Labs Lab 04/18/16 2114 04/18/16 2221  GLUCAP 70  100*   D-Dimer No results for input(s): DDIMER in the last 72 hours. Hgb A1c No results for input(s): HGBA1C in the last 72 hours. Lipid Profile No results for input(s): CHOL, HDL, LDLCALC, TRIG, CHOLHDL, LDLDIRECT in the last 72 hours. Thyroid function studies  Recent Labs  04/20/16 0749  TSH 0.125*   Anemia work up No results for input(s): VITAMINB12, FOLATE, FERRITIN, TIBC, IRON, RETICCTPCT in the last 72 hours. Microbiology Recent Results (from the past 240 hour(s))  Culture, blood (routine x 2)     Status: None (Preliminary result)   Collection Time: 04/19/16 10:30 AM  Result Value Ref Range Status   Specimen Description BLOOD RIGHT FOOT  Final   Special Requests IN PEDIATRIC BOTTLE [place]  Final   Culture NO GROWTH 1 DAY  Final   Report Status PENDING  Incomplete  Culture, blood (routine x 2)     Status: None (Preliminary result)   Collection Time: 04/19/16 10:35 AM  Result Value Ref Range Status   Specimen Description BLOOD LEFT FOOT  Final   Special Requests IN PEDIATRIC BOTTLE 2CC  Final   Culture NO GROWTH 1 DAY  Final   Report Status PENDING  Incomplete     Discharge Instructions:      Medication List    STOP taking these medications        b complex vitamins tablet     famciclovir 500 MG tablet  Commonly known as:  FAMVIR     methocarbamol 500 MG tablet  Commonly known as:  ROBAXIN     oxyCODONE-acetaminophen 5-325 MG tablet  Commonly known as:  PERCOCET/ROXICET     URIBEL 118 MG Caps      TAKE these medications        budesonide-formoterol 160-4.5 MCG/ACT inhaler  Commonly known as:  SYMBICORT  Inhale 2 puffs into the lungs 2 (two) times daily as needed (for asthma).     clonazePAM 1 MG tablet  Commonly known as:  KLONOPIN  Take 1 mg by mouth 3 (three) times daily.     DEXILANT 60 MG capsule  Generic drug:  dexlansoprazole  Take 60 mg by mouth daily.     diphenhydrAMINE 25 MG tablet  Commonly known as:  BENADRYL  Take 50 mg by mouth  every 6 (six) hours as needed for itching or allergies.     hydrOXYzine 50 MG tablet  Commonly known as:  ATARAX/VISTARIL  Take 50-100 mg by mouth 4 (four) times daily as needed for itching (allergies). PT STATES SHE ALWAYS TAKE 4 TIMES A  DAY     levalbuterol 1.25 MG/0.5ML nebulizer solution  Commonly known as:  XOPENEX  Take 1.25 mg by nebulization every 4 (four) hours as needed for wheezing or shortness of breath.     levalbuterol 45 MCG/ACT inhaler  Commonly known as:  XOPENEX HFA  Inhale 2 puffs into the lungs every 4 (four) hours as needed for shortness of breath.     levothyroxine 50 MCG tablet  Commonly known as:  SYNTHROID,  LEVOTHROID  Take 50 mcg by mouth every other day. Alternate with the 75mg  tablet always take the 33mcg on even days per spouse     levothyroxine 75 MCG tablet  Commonly known as:  SYNTHROID, LEVOTHROID  Take 75 mcg by mouth every other day.     metoprolol tartrate 25 MG tablet  Commonly known as:  LOPRESSOR  Take 12.5 mg by mouth 2 (two) times daily. 12.5 in the morning and if blood pressure is high in the afternoon she will take another half tab     morphine 30 MG tablet  Commonly known as:  MSIR  Take 30 mg by mouth 2 (two) times daily. TAKES AT 1 AM AND 1 PM     mupirocin ointment 2 %  Commonly known as:  BACTROBAN  Apply 1 application topically 2 (two) times daily as needed (skin and scalp).     nystatin powder  Generic drug:  nystatin  Apply 1 g topically 2 (two) times daily.     ondansetron 4 MG disintegrating tablet  Commonly known as:  ZOFRAN ODT  Take 1 tablet (4 mg total) by mouth every 6 (six) hours as needed for nausea or vomiting.     predniSONE 1 MG tablet  Commonly known as:  DELTASONE  Take 4 mg by mouth daily with breakfast.     VIACTIV [id number] MG-UNT-MCG Chew  Generic drug:  Calcium-Vitamin D-Vitamin K  Chew 3 each by mouth daily at 12 noon.           Follow-up Information    Follow up with  Melinda Crutch, MD In 1  week.   Specialty:  Family Medicine   Contact information:   [place] [address] [phone]       Please follow up.   Why:  keep appointment with [facility]       Time coordinating discharge: 35 min  Signed:  Hassie Mandt U Felipe Paluch   [facility] 04/21/2016, 10:37 AM

## 2016-04-24 LAB — CULTURE, BLOOD (ROUTINE X 2)
Culture: NO GROWTH
Culture: NO GROWTH

## 2016-06-01 IMAGING — CR DG LUMBAR SPINE COMPLETE 4+V
5 series · 5 of 5 positions shown · non-contrast
Comparison: 10/13/2015, 07/08/2014

CLINICAL DATA: Chronic low back pain, history of lumbar compression
fracture, spinal stimulator

EXAM:
LUMBAR SPINE - COMPLETE 4+ VIEW

[t l-spine a.p.]
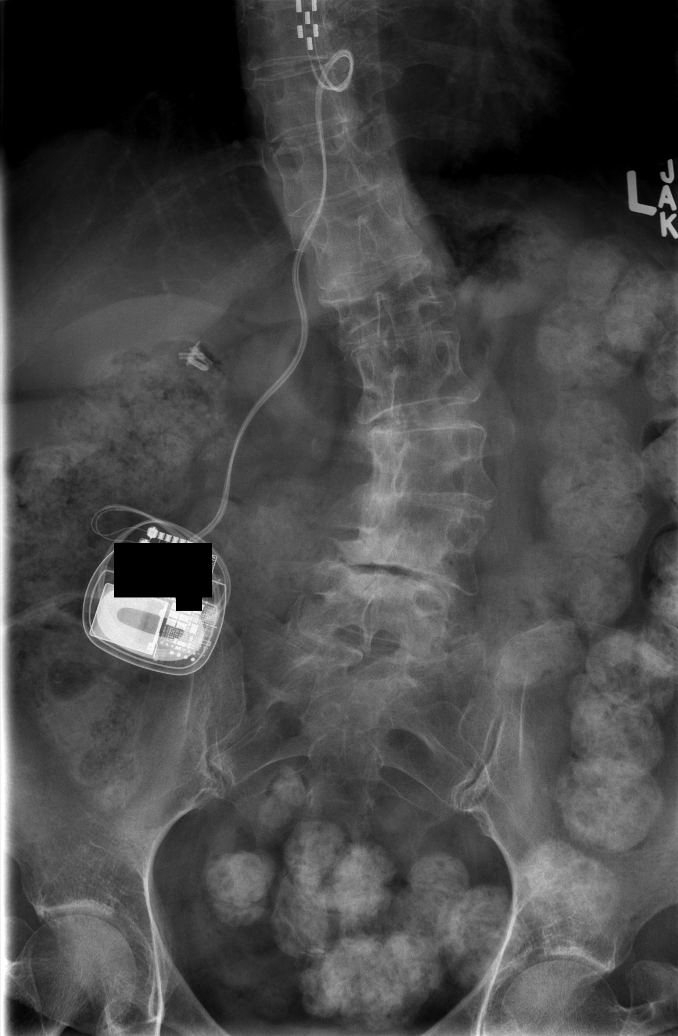

[t l-spine oblique exposure (1 of 2)]
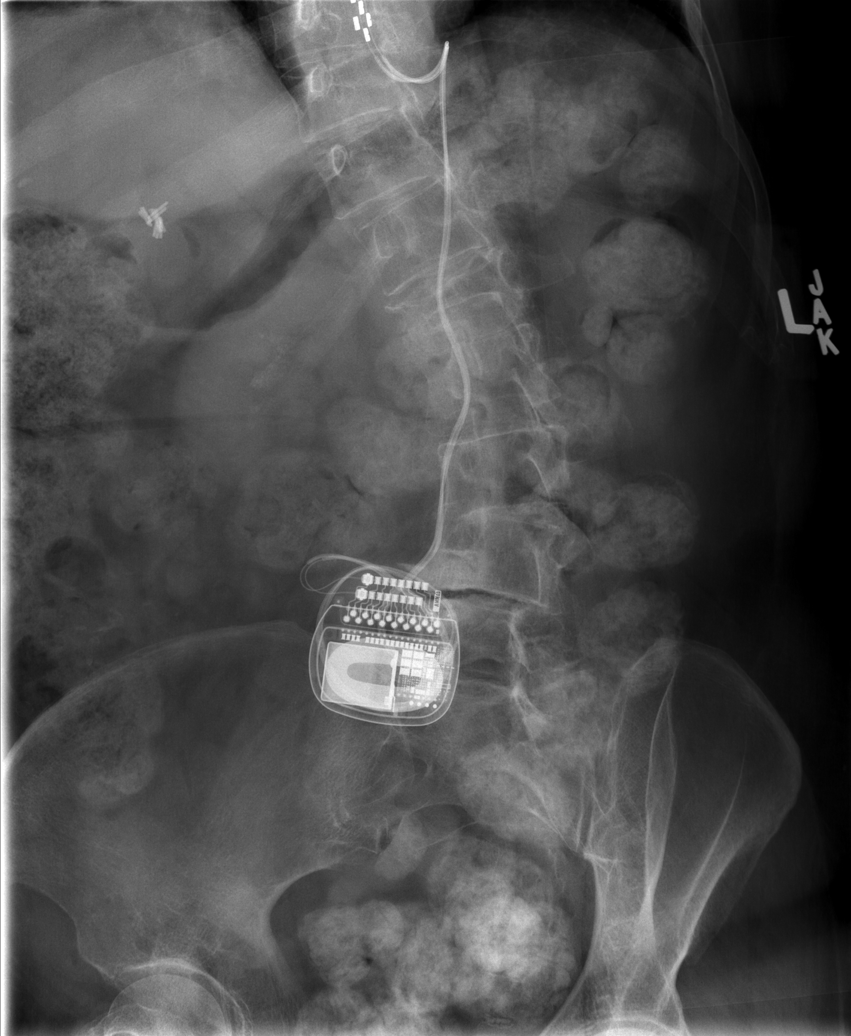

[t l-spine oblique exposure (2 of 2)]
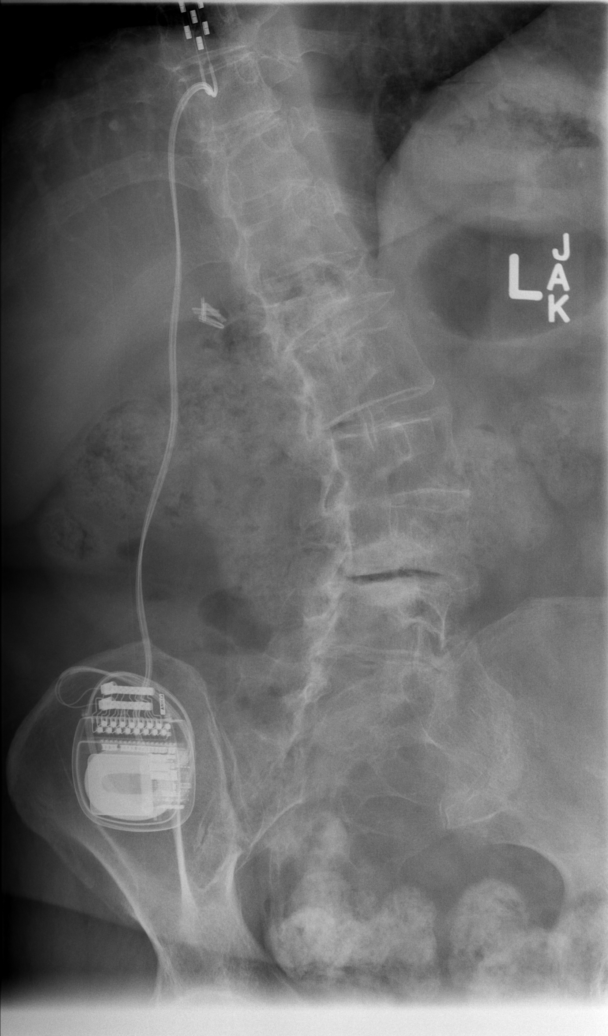

[t l-spine lat]
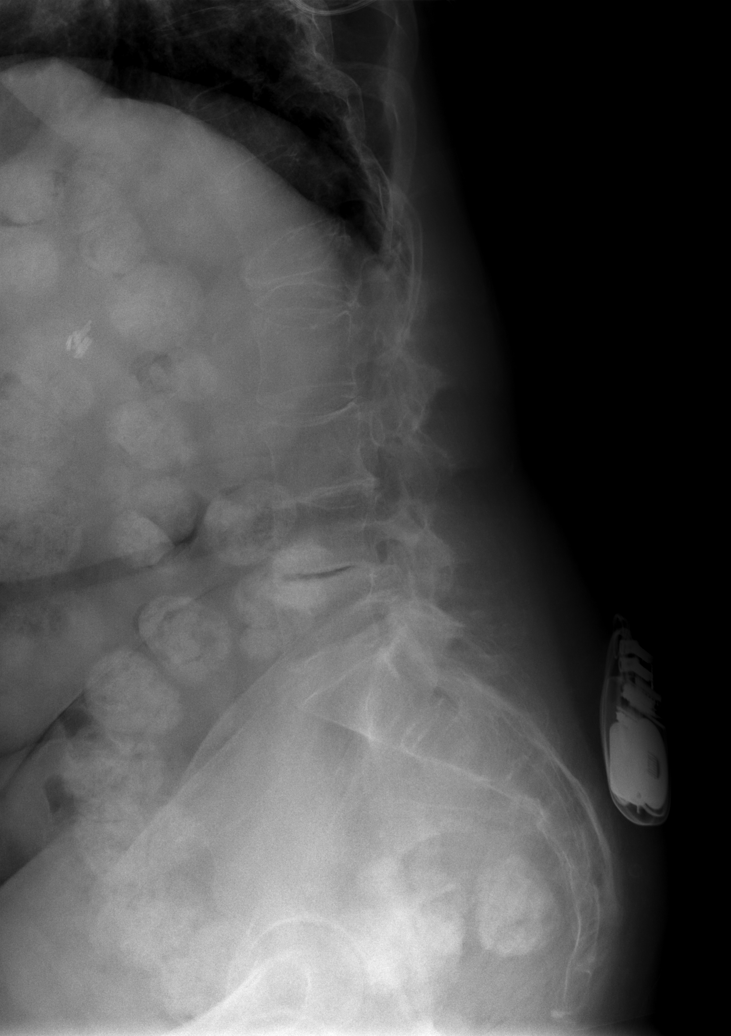

[t l-spine l5-s1 spot]
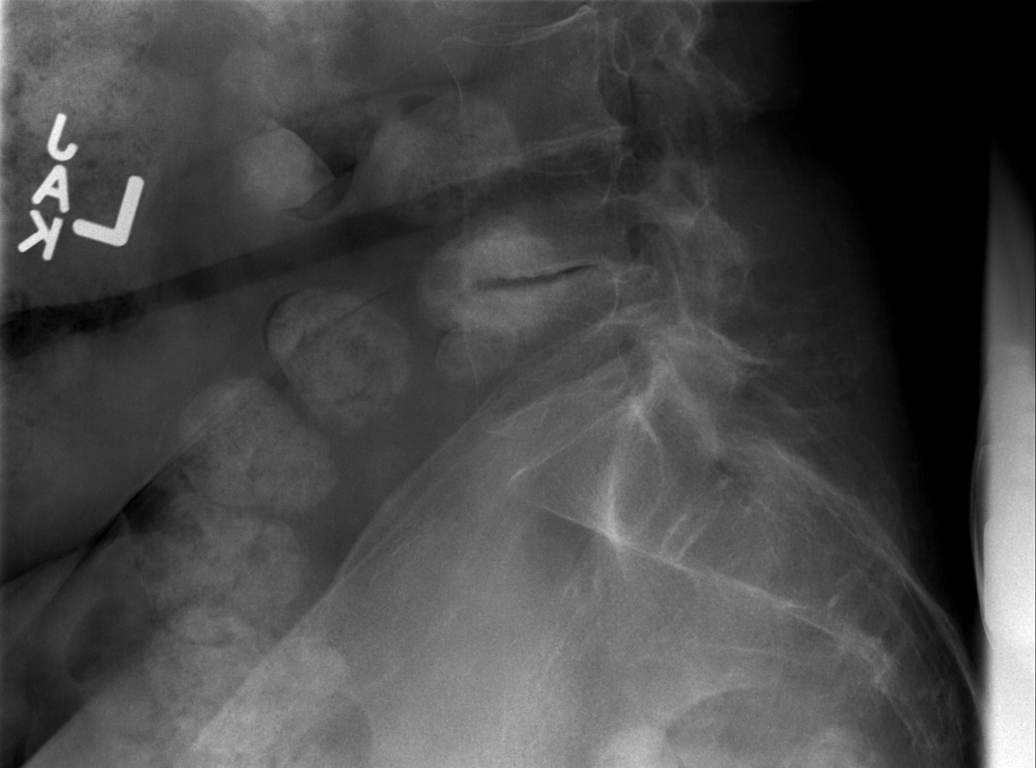

[5 of 5 positions shown; findings below may reference images not displayed]

FINDINGS: Levoscoliosis of the lower thoracic and lumbar spine as before
secondary to spondylosis and degenerative change. Limited oblique
views. Lateral view demonstrates chronic severe compression
deformity of L1. There is advanced degenerative changes and
spondylosis at L4-5 with vacuum disc phenomenon and endplate
sclerosis. No significant interval change or new finding. Spinal
stimulator noted posteriorly. Prior cholecystectomy evident. Large
volume retained stool throughout the colon compatible with
constipation.
IMPRESSION: Stable L1 compression fracture

Stable levoscoliosis and degenerative changes

No significant interval change.

## 2016-06-12 ENCOUNTER — Ambulatory Visit (HOSPITAL_BASED_OUTPATIENT_CLINIC_OR_DEPARTMENT_OTHER): Payer: 59

## 2016-06-12 DIAGNOSIS — Z452 Encounter for adjustment and management of vascular access device: Secondary | ICD-10-CM | POA: Diagnosis not present

## 2016-06-12 DIAGNOSIS — Z853 Personal history of malignant neoplasm of breast: Secondary | ICD-10-CM | POA: Diagnosis not present

## 2016-06-12 DIAGNOSIS — Z95828 Presence of other vascular implants and grafts: Secondary | ICD-10-CM

## 2016-06-12 MED ORDER — HEPARIN SOD (PORK) LOCK FLUSH 100 UNIT/ML IV SOLN
500.0000 [IU] | Freq: Once | INTRAVENOUS | Status: AC
Start: 2016-06-12 — End: 2016-06-12
  Administered 2016-06-12: 500 [IU] via INTRAVENOUS
  Filled 2016-06-12: qty 5

## 2016-06-12 MED ORDER — SODIUM CHLORIDE 0.9% FLUSH
10.0000 mL | INTRAVENOUS | Status: DC | PRN
  Administered 2016-06-12: 10 mL via INTRAVENOUS
  Filled 2016-06-12: qty 10

## 2016-06-12 NOTE — Patient Instructions (Signed)

## 2016-07-30 ENCOUNTER — Other Ambulatory Visit: Payer: Self-pay | Admitting: Oncology

## 2016-07-30 DIAGNOSIS — Z853 Personal history of malignant neoplasm of breast: Secondary | ICD-10-CM

## 2016-08-01 ENCOUNTER — Telehealth: Payer: Self-pay

## 2016-08-01 NOTE — Telephone Encounter (Signed)
LM in Daisy [name] voice mail that she needs to register for her appointment on 08-03-16 between 0730-0745.  The lab appointment is at 0800. Dr. Marko Plume feels that she needs her thyroid checked by endocrinologist at visit on 0-08-11-16 as schedule. Daisy [name] can review with Dr. Marko Plume her esophageal concerns at visit 08-03-16, however, she sees Dr. Amedeo Plenty ing regrad to this problem at the end of September.

## 2016-08-03 ENCOUNTER — Other Ambulatory Visit (HOSPITAL_BASED_OUTPATIENT_CLINIC_OR_DEPARTMENT_OTHER): Payer: 59

## 2016-08-03 ENCOUNTER — Ambulatory Visit (HOSPITAL_BASED_OUTPATIENT_CLINIC_OR_DEPARTMENT_OTHER): Payer: 59 | Admitting: Oncology

## 2016-08-03 ENCOUNTER — Encounter: Payer: Self-pay | Admitting: Oncology

## 2016-08-03 ENCOUNTER — Telehealth: Payer: Self-pay | Admitting: Oncology

## 2016-08-03 ENCOUNTER — Ambulatory Visit (HOSPITAL_BASED_OUTPATIENT_CLINIC_OR_DEPARTMENT_OTHER): Payer: 59

## 2016-08-03 VITALS — BP 119/70 | HR 69 | Temp 97.8°F | Resp 18 | Ht 61.0 in | Wt 151.6 lb

## 2016-08-03 DIAGNOSIS — Z853 Personal history of malignant neoplasm of breast: Secondary | ICD-10-CM

## 2016-08-03 DIAGNOSIS — D0512 Intraductal carcinoma in situ of left breast: Secondary | ICD-10-CM

## 2016-08-03 DIAGNOSIS — Z95828 Presence of other vascular implants and grafts: Secondary | ICD-10-CM

## 2016-08-03 LAB — CBC WITH DIFFERENTIAL/PLATELET
BASO%: 1 % (ref 0.0–2.0)
Basophils Absolute: 0.1 10*3/uL (ref 0.0–0.1)
EOS%: 2.8 % (ref 0.0–7.0)
Eosinophils Absolute: 0.2 10*3/uL (ref 0.0–0.5)
HCT: 41.3 % (ref 34.8–46.6)
HGB: 13 g/dL (ref 11.6–15.9)
LYMPH%: 42.9 % (ref 14.0–49.7)
MCH: 25.2 pg (ref 25.1–34.0)
MCHC: 31.4 g/dL — AB (ref 31.5–36.0)
MCV: 80.4 fL (ref 79.5–101.0)
MONO#: 0.7 10*3/uL (ref 0.1–0.9)
MONO%: 8.7 % (ref 0.0–14.0)
NEUT%: 44.6 % (ref 38.4–76.8)
NEUTROS ABS: 3.7 10*3/uL (ref 1.5–6.5)
Platelets: 216 10*3/uL (ref 145–400)
RBC: 5.14 10*6/uL (ref 3.70–5.45)
RDW: 14.5 % (ref 11.2–14.5)
WBC: 8.3 10*3/uL (ref 3.9–10.3)
lymph#: 3.5 10*3/uL — ABNORMAL HIGH (ref 0.9–3.3)

## 2016-08-03 LAB — COMPREHENSIVE METABOLIC PANEL
ALT: 10 U/L (ref 0–55)
ANION GAP: 11 meq/L (ref 3–11)
AST: 18 U/L (ref 5–34)
Albumin: 3.5 g/dL (ref 3.5–5.0)
Alkaline Phosphatase: 65 U/L (ref 40–150)
BILIRUBIN TOTAL: 0.51 mg/dL (ref 0.20–1.20)
BUN: 19.6 mg/dL (ref 7.0–26.0)
CHLORIDE: 104 meq/L (ref 98–109)
CO2: 26 meq/L (ref 22–29)
CREATININE: 0.9 mg/dL (ref 0.6–1.1)
Calcium: 8.8 mg/dL (ref 8.4–10.4)
EGFR: 66 mL/min/{1.73_m2} — ABNORMAL LOW (ref >90)
GLUCOSE: 93 mg/dL (ref 70–140)
Potassium: 3.9 mEq/L (ref 3.5–5.1)
SODIUM: 140 meq/L (ref 136–145)
TOTAL PROTEIN: 6.7 g/dL (ref 6.4–8.3)

## 2016-08-03 MED ORDER — HEPARIN SOD (PORK) LOCK FLUSH 100 UNIT/ML IV SOLN
500.0000 [IU] | Freq: Once | INTRAVENOUS | Status: AC | PRN
  Administered 2016-08-03: 500 [IU] via INTRAVENOUS
  Filled 2016-08-03: qty 5

## 2016-08-03 MED ORDER — SODIUM CHLORIDE 0.9 % IJ SOLN
10.0000 mL | INTRAMUSCULAR | Status: DC | PRN
  Administered 2016-08-03: 10 mL via INTRAVENOUS
  Filled 2016-08-03: qty 10

## 2016-08-03 NOTE — Progress Notes (Signed)
OFFICE PROGRESS NOTE   August 03, 2016   Physicians: C.Melinda Crutch, T.Rosenbower, J.Hayes, Clarita Leber [facility])/ Rosita Fire, Nicholaus Bloom, H.Elsner, Darreld Mclean (dermatology [facility]), Susa Raring (ID [facility])   CareEverywhere information from [facility] reviewed for visit.   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 Due to difficult peripheral access, patient has until now wanted to keep the [facility], which we have tried to manage at this office.  As PAC was not used during most recent hospitalization and is not used by other MDs involved, and as she has not been able to keep up with flushes at this office, patient is now in agreement with having this removed. I have requested return visit to Dr Zella Richer for this reason.  Patient continues to struggle with multiple medical problems, most recently bacterial folliculitis and rosacea seen by dermatology and infectious disease at [facility], using medicated body wash now, and esophagitis evaluated by Dr Amedeo Plenty. She has had no apparent problems with PAC, however this was not used during hospitalization in May 2017 , with peripheral IV access adequate then.  She is eating diet with lots of fruits and vegetables, very little red meat, probably no high calorie foods. In years past she liked Ensure, that DCd due to excessive weight gain a few years ago. As she has lost 26 lbs from  09-2015 to now, and as BMI now 28 with ongoing weight loss per patient, I have suggested that she might want to add back at least 1 Ensure daily.   She denies recent fever or symptomatic infection. She denies pain now. Breathing is at baseline. No recent falls. No  bleeding. No abdominal or chest pain. No problems with PAC, which was flushed today (last flush apparently 04-21-16, should have had flushes every 6-8 weeks when not otherwise used)   PAC placed by Dr Zella Richer [id number]. Prior to today 08-03-16, apparently last flushed at this office 04-2016.  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 She additionally has history of T1N0 ER/PR positive left breast cancer in 1993, treated with lumpectomy and axillary node evaluation and adjuvant adriamycin/ cytoxan by Dr.Neijstrom, local radiation, then unable to tolerate tamoxifen due to migraines.  She had right mastectomy 09-2014 with benign pathology   Objective:  Vital signs in last 24 hours:  BP 119/70 (BP Location: Right Arm, Patient Position: Sitting)   Pulse 69   Temp 97.8 F (36.6 C) (Oral)   Resp 18   Ht 5\' 1"  (1.549 m)   Wt 151 lb 9.6 oz (68.8 kg) Comment: verified wt with pt  SpO2 99%   BMI 28.64 kg/m  Weight down from 176 in 09-2015 at this office. Chronically ill appearing but NAD. Alert, oriented and appropriate, as usual has difficulty giving concise history, but very pleasant as always. Using WC. Respirations not labored RA  HEENT:PERRL, sclerae not icteric. Oral mucosa moist without lesions, posterior pharynx clear.  Neck supple. No JVD.  Lymphatics:no cervical,supraclavicular, axillary adenopathy Resp: clear to auscultation bilaterally  Cardio: regular rate and  rhythm. No gallop. GI: soft, nontender, not distended, no mass or organomegaly. Normally active bowel sounds. Musculoskeletal/ Extremities: without pitting edema, cords, tenderness Neuro: no peripheral neuropathy. Otherwise nonfocal Skin without ecchymosis, petechiae. Erythema central face consistent with  rosacea. Few scattered lesions in scalp Breasts: bilateral mastectomy scars without evidence of local recurrence. Axillae benign. No swelling UE. Portacath-without erythema or tenderness  Lab Results:  Results for orders placed or performed in visit on 08/03/16  CBC with Differential  Result Value Ref Range   WBC 8.3 3.9 - 10.3 10e3/uL   NEUT# 3.7 1.5 - 6.5 10e3/uL   HGB 13.0 11.6 - 15.9 g/dL   HCT 41.3 34.8 - 46.6 %   Platelets 216 145 - 400 10e3/uL   MCV 80.4 79.5 - 101.0 fL   MCH 25.2 25.1 - 34.0 pg   MCHC 31.4 (L) 31.5 - 36.0 g/dL   RBC 5.14 3.70 - 5.45 10e6/uL   RDW 14.5 11.2 - 14.5 %   lymph# 3.5 (H) 0.9 - 3.3 10e3/uL   MONO# 0.7 0.1 - 0.9 10e3/uL   Eosinophils Absolute 0.2 0.0 - 0.5 10e3/uL   Basophils Absolute 0.1 0.0 - 0.1 10e3/uL   NEUT% 44.6 38.4 - 76.8 %   LYMPH% 42.9 14.0 - 49.7 %   MONO% 8.7 0.0 - 14.0 %   EOS% 2.8 0.0 - 7.0 %   BASO% 1.0 0.0 - 2.0 %     Studies/Results:  No results found.  Medications: I have reviewed the patient's current medications.  DISCUSSION No concerns from standpoint of early stage left breast cancer,  now out 24 years and post bilateral mastectomies. Counts are good, obviously will be followed by other physicians due to multiple comorbid conditions in addition to chemotherapy used 1993 (adriamycin cytoxan) Patient in agreement with removal of PAC, as peripheral IV access now easier likely given weight loss, and risks of PAC outweigh benefits now. I have requested appointment back with Dr Zella Richer to remove. Patient is concerned that removal of a prior PAC (used for 1993 chemo) was painful, will be sure Dr Zella Richer is aware so that this can be managed with removal of this PAC. As Dr Harrington Challenger not in office today, I have left him a detailed VM re PAC removal, in case he is not in agreement.   As she is out 24 years from breast cancer diagnosis and treatment, has had bilateral mastectomies and will have PAC removed, she does not need  scheduled follow up at this office. Recommend chemistries and CBC at least yearly with history of chemo and radiation (these labs obviously will be done with other medical problems.) Patient understands that she can be seen back by medical oncology in future if she or other MDs feel that we can be of help.   Assessment/Plan:  1. Stage 1 left breast cancer treated which chemo and radiation, and subsequent left breast DCIS: post left mastectomy. No known active malignancy.  2. Benign right breast biopsy 08-26-14, subsequent right mastectomy prophylactic with benign surgical path.  3.Addison's disease on chronic steroids, now followed by Dr Buddy Duty 4.pyelonephritis [phone] 5.L1 fracture sustained in fall  ~ 2015 6.spinal stimulator in place, followed at pain clinic for chronic pain 7.PAC in since 2013, risks outweigh benefits now. Agrees with removing. 8.obesity but progressive weight loss over last several years, primariy intentional, seems diet related. May need to add some Ensure again to maintain now.  9. Erosive esophagitis at upper endoscopy 04-2012, followed by Dr Amedeo Plenty 10. Rosacea  and bacterial folliculitis now followed by dermatology and infectious disease at [facility]. Likely this was problem in past that patient considered recurrent shingles.   All questions answered and patient is in agreement with recommendations and plans. Time spent 25  min incluiding >50% counseling and coordination of care. CC Drs Harrington Challenger and Zella Richer   Evlyn Clines, MD   08/03/2016, 8:50 AM

## 2016-08-03 NOTE — Telephone Encounter (Signed)
Dr Zella Richer office will contact pt to schedule port removal

## 2016-08-04 ENCOUNTER — Telehealth: Payer: Self-pay | Admitting: *Deleted

## 2016-08-04 NOTE — Telephone Encounter (Signed)
"  April with Dr. Zella Richer calling for Documentation about port-a-cath.  She's calling us requesting removal.  Please fax to [phone]" Referral entered yesterday by Dr. Marko Plume.  April able to view in Epic and will print and use this.

## 2016-08-14 ENCOUNTER — Ambulatory Visit: Payer: Self-pay | Admitting: General Surgery

## 2016-08-14 NOTE — H&P (Signed)
Daisy [name] 08/14/2016 2:56 PM Location: [facility] Patient #: [id number] DOB: 01-Apr-1951 Married / Language: Cleophus Molt / Race: White Female  History of Present Illness Odis Hollingshead MD; 08/14/2016 3:20 PM) Patient words: consult, removing port.  The patient is a 65 year old female.   Note:She is referred by Dr. Marko Plume for consultation regarding Port-a-cath [facility]) removal. She's had the Port-A-Cath indwelling for many years because of poor IV access. She is doing much better overall and has lost a significant amount of weight. Peripheral IV access now is much easier. She no longer needs to Port-A-Cath.  Allergies (April Staton, CMA; 08/14/2016 2:57 PM) Sulfa 10 *OPHTHALMIC AGENTS* hallucinations Augmentin *PENICILLINS* bad head ache Iodinated Contrast Media Anaphylaxis. Reglan *GASTROINTESTINAL AGENTS - MISC.* hyperactivity Lisinopril *ANTIHYPERTENSIVES* Cough. Phenergan *ANTIHISTAMINES* anxiety Tussin *COUGH/COLD/ALLERGY* head ache LamISIL *ANTIFUNGALS* Anaphylaxis. Dilaudid *ANALGESICS - OPIOID* Vomiting. Ambien *HYPNOTICS/SEDATIVES/SLEEP DISORDER AGENTS* shacky Vancomycin HCl *ANTI-INFECTIVE AGENTS - MISC.* Swelling. Biaxin *MACROLIDES* Amoxicillin *PENICILLINS* Diarrhea. Mucinex *COUGH/COLD/ALLERGY* shaky Aldara *DERMATOLOGICALS* sick Compazine *ANTIPSYCHOTICS/ANTIMANIC AGENTS* anxious, shaky MetFORMIN HCl *CHEMICALS* migraines  Medication History (April Staton, CMA; 08/14/2016 2:58 PM) Zofran ODT (4MG  Tablet Disint, 1 (one) Tablet Disperse Oral every four hours, as needed, Taken starting 10/16/2014) Active. Losartan Potassium (50MG  Tablet, Oral) Active. PredniSONE (5MG  Tablet, Oral) Active. Vitamin B Complex (Oral) Active. Senokot (8.6MG  Tablet, Oral) Active. ClonazePAM (1MG  Tablet, Oral three times daily) Active. Omeprazole (40MG  Capsule DR, Oral) Active. (1 tablet in morning; 1 tablet in evening.) Synthroid (75MCG Tablet,  Oral) Active. (1 tablet in the morning and 1 tablet in the evening.) HydrOXYzine HCl (50MG  Tablet, Oral four times daily) Active. Robaxin (500MG  Tablet, Oral three times daily) Active. Metoprolol Tartrate (25MG  Tablet, Oral) Active. (1 - 1/2 tablet qam.) Xopenex HFA (45MCG/ACT Aerosol, Inhalation two times daily) Active. Symbicort (160-4.5MCG/ACT Aerosol, Inhalation) Active. (2 inhalations in the morning; 2 inhalations in the evening.) Viactiv (500-500-40MG -UNT-MCG Tablet Chewable, Oral) Active. Morphine Sulfate (30MG  Tablet, Oral two times daily) Active. Medications Reconciled    Vitals (April Staton CMA; 08/14/2016 3:01 PM) 08/14/2016 2:58 PM Weight: 150.5 lb Height: 61in Height was reported by patient. Body Surface Area: 1.67 m Body Mass Index: 28.44 kg/m  Temp.: 98.71F(Oral)  Pulse: 83 (Regular)  P.OX: 96% (Room air)      Physical Exam Odis Hollingshead MD; 08/14/2016 3:22 PM)  The physical exam findings are as follows: Note:General: Slightly overweight female in NAD. Pleasant and cooperative.  EYES: no scleral icterus  CHEST: Bilateral mastectomy scars present. No chest wall nodules. Palpable Port-A-Cath in right upper chest wall.  NEUROLOGIC: Alert and oriented, answers questions appropriately, normal gait and station.  PSYCHIATRIC: Normal mood, affect , and behavior.    Assessment & Plan Odis Hollingshead MD; 08/14/2016 3:19 PM)  PORT-A-CATH IN PLACE [phone]) Impression: She no longer needs the PAC.  Plan: Outpatient PAC removal. The procedure and risks, including but not limited to bleeding, infection, wound healing problems have been discussed. She seems to understand and would like to proceed.  Jackolyn Confer, MD

## 2016-08-28 ENCOUNTER — Other Ambulatory Visit: Payer: Self-pay | Admitting: Gastroenterology

## 2016-08-28 DIAGNOSIS — R1310 Dysphagia, unspecified: Secondary | ICD-10-CM

## 2016-08-29 ENCOUNTER — Other Ambulatory Visit: Payer: Self-pay

## 2016-09-04 ENCOUNTER — Ambulatory Visit
Admission: RE | Admit: 2016-09-04 | Discharge: 2016-09-04 | Disposition: A | Discharge status: Home / Self Care | Payer: Self-pay | Source: Ambulatory Visit | Attending: Gastroenterology | Admitting: Gastroenterology

## 2016-09-04 DIAGNOSIS — R1310 Dysphagia, unspecified: Secondary | ICD-10-CM

## 2016-09-21 ENCOUNTER — Encounter (HOSPITAL_COMMUNITY): Payer: Self-pay | Admitting: *Deleted

## 2016-09-21 DIAGNOSIS — Z923 Personal history of irradiation: Secondary | ICD-10-CM | POA: Diagnosis not present

## 2016-09-21 DIAGNOSIS — J45909 Unspecified asthma, uncomplicated: Secondary | ICD-10-CM | POA: Diagnosis not present

## 2016-09-21 DIAGNOSIS — Z888 Allergy status to other drugs, medicaments and biological substances status: Secondary | ICD-10-CM | POA: Diagnosis not present

## 2016-09-21 DIAGNOSIS — K219 Gastro-esophageal reflux disease without esophagitis: Secondary | ICD-10-CM | POA: Diagnosis not present

## 2016-09-21 DIAGNOSIS — Z882 Allergy status to sulfonamides status: Secondary | ICD-10-CM | POA: Diagnosis not present

## 2016-09-21 DIAGNOSIS — Z452 Encounter for adjustment and management of vascular access device: Secondary | ICD-10-CM | POA: Diagnosis not present

## 2016-09-21 DIAGNOSIS — E039 Hypothyroidism, unspecified: Secondary | ICD-10-CM | POA: Diagnosis not present

## 2016-09-21 DIAGNOSIS — R51 Headache: Secondary | ICD-10-CM | POA: Diagnosis not present

## 2016-09-21 DIAGNOSIS — Z79899 Other long term (current) drug therapy: Secondary | ICD-10-CM | POA: Diagnosis not present

## 2016-09-21 DIAGNOSIS — G4730 Sleep apnea, unspecified: Secondary | ICD-10-CM | POA: Diagnosis not present

## 2016-09-21 DIAGNOSIS — Z9981 Dependence on supplemental oxygen: Secondary | ICD-10-CM | POA: Diagnosis not present

## 2016-09-21 DIAGNOSIS — F419 Anxiety disorder, unspecified: Secondary | ICD-10-CM | POA: Diagnosis not present

## 2016-09-21 DIAGNOSIS — Z91041 Radiographic dye allergy status: Secondary | ICD-10-CM | POA: Diagnosis not present

## 2016-09-21 DIAGNOSIS — Z853 Personal history of malignant neoplasm of breast: Secondary | ICD-10-CM | POA: Diagnosis not present

## 2016-09-21 DIAGNOSIS — D649 Anemia, unspecified: Secondary | ICD-10-CM | POA: Diagnosis not present

## 2016-09-21 DIAGNOSIS — E271 Primary adrenocortical insufficiency: Secondary | ICD-10-CM | POA: Diagnosis not present

## 2016-09-21 DIAGNOSIS — Z881 Allergy status to other antibiotic agents status: Secondary | ICD-10-CM | POA: Diagnosis not present

## 2016-09-21 DIAGNOSIS — Z9221 Personal history of antineoplastic chemotherapy: Secondary | ICD-10-CM | POA: Diagnosis not present

## 2016-09-21 DIAGNOSIS — M797 Fibromyalgia: Secondary | ICD-10-CM | POA: Diagnosis not present

## 2016-09-21 DIAGNOSIS — I10 Essential (primary) hypertension: Secondary | ICD-10-CM | POA: Diagnosis not present

## 2016-09-21 DIAGNOSIS — K449 Diaphragmatic hernia without obstruction or gangrene: Secondary | ICD-10-CM | POA: Diagnosis not present

## 2016-09-21 DIAGNOSIS — M1990 Unspecified osteoarthritis, unspecified site: Secondary | ICD-10-CM | POA: Diagnosis not present

## 2016-09-21 NOTE — Progress Notes (Signed)
Pt denies SOB, chest pain, and being under the care of a cardiologist. Pt denies having a stress test and cardiac cath. Pt denies having an EKG within the last year. Pt made aware to stop taking Aspirin, vitamins, fish oil and herbal medications. Do not take any NSAIDs ie: Ibuprofen, Advil, Naproxen, BC and Goody Powder or any medication containing Aspirin. Pt verbalized understanding of all pre-op instructions.

## 2016-09-25 ENCOUNTER — Ambulatory Visit (HOSPITAL_COMMUNITY)
Admission: RE | Admit: 2016-09-25 | Discharge: 2016-09-25 | Disposition: A | Discharge status: Home / Self Care | Payer: 59 | Source: Ambulatory Visit | Attending: General Surgery | Admitting: General Surgery

## 2016-09-25 ENCOUNTER — Ambulatory Visit (HOSPITAL_COMMUNITY): Payer: 59 | Admitting: Certified Registered"

## 2016-09-25 ENCOUNTER — Encounter (HOSPITAL_COMMUNITY): Payer: Self-pay | Admitting: Certified Registered"

## 2016-09-25 ENCOUNTER — Encounter (HOSPITAL_COMMUNITY)
Admission: RE | Disposition: A | Discharge status: Home / Self Care | Payer: Self-pay | Source: Ambulatory Visit | Attending: General Surgery

## 2016-09-25 DIAGNOSIS — M797 Fibromyalgia: Secondary | ICD-10-CM | POA: Insufficient documentation

## 2016-09-25 DIAGNOSIS — Z882 Allergy status to sulfonamides status: Secondary | ICD-10-CM | POA: Insufficient documentation

## 2016-09-25 DIAGNOSIS — K219 Gastro-esophageal reflux disease without esophagitis: Secondary | ICD-10-CM | POA: Insufficient documentation

## 2016-09-25 DIAGNOSIS — E271 Primary adrenocortical insufficiency: Secondary | ICD-10-CM | POA: Insufficient documentation

## 2016-09-25 DIAGNOSIS — R51 Headache: Secondary | ICD-10-CM | POA: Insufficient documentation

## 2016-09-25 DIAGNOSIS — M1990 Unspecified osteoarthritis, unspecified site: Secondary | ICD-10-CM | POA: Insufficient documentation

## 2016-09-25 DIAGNOSIS — Z9221 Personal history of antineoplastic chemotherapy: Secondary | ICD-10-CM | POA: Diagnosis not present

## 2016-09-25 DIAGNOSIS — Z452 Encounter for adjustment and management of vascular access device: Secondary | ICD-10-CM | POA: Diagnosis not present

## 2016-09-25 DIAGNOSIS — Z79899 Other long term (current) drug therapy: Secondary | ICD-10-CM | POA: Insufficient documentation

## 2016-09-25 DIAGNOSIS — Z923 Personal history of irradiation: Secondary | ICD-10-CM | POA: Insufficient documentation

## 2016-09-25 DIAGNOSIS — Z91041 Radiographic dye allergy status: Secondary | ICD-10-CM | POA: Insufficient documentation

## 2016-09-25 DIAGNOSIS — Z888 Allergy status to other drugs, medicaments and biological substances status: Secondary | ICD-10-CM | POA: Insufficient documentation

## 2016-09-25 DIAGNOSIS — F419 Anxiety disorder, unspecified: Secondary | ICD-10-CM | POA: Insufficient documentation

## 2016-09-25 DIAGNOSIS — Z853 Personal history of malignant neoplasm of breast: Secondary | ICD-10-CM | POA: Insufficient documentation

## 2016-09-25 DIAGNOSIS — E039 Hypothyroidism, unspecified: Secondary | ICD-10-CM | POA: Insufficient documentation

## 2016-09-25 DIAGNOSIS — I10 Essential (primary) hypertension: Secondary | ICD-10-CM | POA: Insufficient documentation

## 2016-09-25 DIAGNOSIS — Z881 Allergy status to other antibiotic agents status: Secondary | ICD-10-CM | POA: Insufficient documentation

## 2016-09-25 DIAGNOSIS — J45909 Unspecified asthma, uncomplicated: Secondary | ICD-10-CM | POA: Insufficient documentation

## 2016-09-25 DIAGNOSIS — G4730 Sleep apnea, unspecified: Secondary | ICD-10-CM | POA: Insufficient documentation

## 2016-09-25 DIAGNOSIS — Z9981 Dependence on supplemental oxygen: Secondary | ICD-10-CM | POA: Insufficient documentation

## 2016-09-25 DIAGNOSIS — K449 Diaphragmatic hernia without obstruction or gangrene: Secondary | ICD-10-CM | POA: Insufficient documentation

## 2016-09-25 DIAGNOSIS — D649 Anemia, unspecified: Secondary | ICD-10-CM | POA: Insufficient documentation

## 2016-09-25 HISTORY — PX: PORT-A-CATH REMOVAL: SHX5289

## 2016-09-25 HISTORY — DX: Other reaction to spinal and lumbar puncture: G97.1

## 2016-09-25 LAB — COMPREHENSIVE METABOLIC PANEL
ALBUMIN: 3.5 g/dL (ref 3.5–5.0)
ALK PHOS: 60 U/L (ref 38–126)
ALT: 10 U/L — AB (ref 14–54)
ANION GAP: 9 (ref 5–15)
AST: 18 U/L (ref 15–41)
BILIRUBIN TOTAL: 0.4 mg/dL (ref 0.3–1.2)
BUN: 20 mg/dL (ref 6–20)
CALCIUM: 8.8 mg/dL — AB (ref 8.9–10.3)
CO2: 25 mmol/L (ref 22–32)
CREATININE: 0.91 mg/dL (ref 0.44–1.00)
Chloride: 104 mmol/L (ref 101–111)
GFR calc Af Amer: >60 mL/min (ref >60)
GFR calc non Af Amer: >60 mL/min (ref >60)
GLUCOSE: 99 mg/dL (ref 65–99)
Potassium: 3.8 mmol/L (ref 3.5–5.1)
SODIUM: 138 mmol/L (ref 135–145)
TOTAL PROTEIN: 6 g/dL — AB (ref 6.5–8.1)

## 2016-09-25 LAB — CBC
HCT: 39.3 % (ref 36.0–46.0)
HEMOGLOBIN: 12.5 g/dL (ref 12.0–15.0)
MCH: 25.2 pg — AB (ref 26.0–34.0)
MCHC: 31.8 g/dL (ref 30.0–36.0)
MCV: 79.1 fL (ref 78.0–100.0)
Platelets: 204 10*3/uL (ref 150–400)
RBC: 4.97 MIL/uL (ref 3.87–5.11)
RDW: 15.3 % (ref 11.5–15.5)
WBC: 8.6 10*3/uL (ref 4.0–10.5)

## 2016-09-25 SURGERY — REMOVAL PORT-A-CATH
Anesthesia: Monitor Anesthesia Care | Site: Chest | Laterality: Right

## 2016-09-25 MED ORDER — PROPOFOL 500 MG/50ML IV EMUL
INTRAVENOUS | Status: DC | PRN
  Administered 2016-09-25: 125 ug/kg/min via INTRAVENOUS

## 2016-09-25 MED ORDER — FENTANYL CITRATE (PF) 100 MCG/2ML IJ SOLN
INTRAMUSCULAR | Status: AC
  Filled 2016-09-25: qty 2

## 2016-09-25 MED ORDER — 0.9 % SODIUM CHLORIDE (POUR BTL) OPTIME
TOPICAL | Status: DC | PRN
  Administered 2016-09-25: 1000 mL

## 2016-09-25 MED ORDER — MIDAZOLAM HCL 2 MG/2ML IJ SOLN
INTRAMUSCULAR | Status: AC
  Filled 2016-09-25: qty 2

## 2016-09-25 MED ORDER — BUPIVACAINE HCL (PF) 0.25 % IJ SOLN
INTRAMUSCULAR | Status: AC
  Filled 2016-09-25: qty 30

## 2016-09-25 MED ORDER — CHLORHEXIDINE GLUCONATE CLOTH 2 % EX PADS: 6.0000 | MEDICATED_PAD | Freq: Once | CUTANEOUS | Status: DC

## 2016-09-25 MED ORDER — LIDOCAINE-EPINEPHRINE (PF) 1 %-1:200000 IJ SOLN
INTRAMUSCULAR | Status: AC
  Filled 2016-09-25: qty 30

## 2016-09-25 MED ORDER — LACTATED RINGERS IV SOLN
INTRAVENOUS | Status: DC | PRN
  Administered 2016-09-25: 07:00:00 via INTRAVENOUS

## 2016-09-25 MED ORDER — MIDAZOLAM HCL 5 MG/5ML IJ SOLN
INTRAMUSCULAR | Status: DC | PRN
  Administered 2016-09-25: 2 mg via INTRAVENOUS

## 2016-09-25 MED ORDER — CEFAZOLIN SODIUM-DEXTROSE 2-4 GM/100ML-% IV SOLN
INTRAVENOUS | Status: AC
  Filled 2016-09-25: qty 100

## 2016-09-25 MED ORDER — PROPOFOL 10 MG/ML IV BOLUS
INTRAVENOUS | Status: AC
  Filled 2016-09-25: qty 20

## 2016-09-25 MED ORDER — FENTANYL CITRATE (PF) 100 MCG/2ML IJ SOLN
INTRAMUSCULAR | Status: DC | PRN
  Administered 2016-09-25: 100 ug via INTRAVENOUS

## 2016-09-25 MED ORDER — BUPIVACAINE HCL (PF) 0.25 % IJ SOLN
INTRAMUSCULAR | Status: DC | PRN
  Administered 2016-09-25: 30 mL

## 2016-09-25 MED ORDER — LIDOCAINE-EPINEPHRINE (PF) 1 %-1:200000 IJ SOLN
INTRAMUSCULAR | Status: DC | PRN
  Administered 2016-09-25: 30 mL

## 2016-09-25 MED ORDER — DIPHENHYDRAMINE HCL 50 MG/ML IJ SOLN
INTRAMUSCULAR | Status: DC | PRN
  Administered 2016-09-25: 25 mg via INTRAVENOUS

## 2016-09-25 MED ORDER — CEFAZOLIN SODIUM-DEXTROSE 2-4 GM/100ML-% IV SOLN
2.0000 g | INTRAVENOUS | Status: AC
  Administered 2016-09-25: 2 g via INTRAVENOUS

## 2016-09-25 SURGICAL SUPPLY — 35 items
APL SKNCLS STERI-STRIP NONHPOA (GAUZE/BANDAGES/DRESSINGS) ×1
BENZOIN TINCTURE PRP APPL 2/3 (GAUZE/BANDAGES/DRESSINGS) ×3 IMPLANT
CHLORAPREP W/TINT 10.5 ML (MISCELLANEOUS) ×3 IMPLANT
CLOSURE WOUND 1/2 X4 (GAUZE/BANDAGES/DRESSINGS) ×1
COVER SURGICAL LIGHT HANDLE (MISCELLANEOUS) ×3 IMPLANT
DRAPE LAPAROTOMY 100X72 PEDS (DRAPES) ×3 IMPLANT
DRAPE UTILITY XL STRL (DRAPES) ×6 IMPLANT
DRSG TEGADERM 2-3/8X2-3/4 SM (GAUZE/BANDAGES/DRESSINGS) ×2 IMPLANT
ELECT CAUTERY BLADE 6.4 (BLADE) ×3 IMPLANT
ELECT REM PT RETURN 9FT ADLT (ELECTROSURGICAL) ×3
ELECTRODE REM PT RTRN 9FT ADLT (ELECTROSURGICAL) ×1 IMPLANT
GAUZE SPONGE 2X2 8PLY STRL LF (GAUZE/BANDAGES/DRESSINGS) ×1 IMPLANT
GAUZE SPONGE 4X4 16PLY XRAY LF (GAUZE/BANDAGES/DRESSINGS) ×3 IMPLANT
GLOVE BIOGEL PI IND STRL 8 (GLOVE) ×1 IMPLANT
GLOVE BIOGEL PI INDICATOR 8 (GLOVE) ×2
GLOVE ECLIPSE 8.0 STRL XLNG CF (GLOVE) ×3 IMPLANT
GOWN STRL REUS W/ TWL LRG LVL3 (GOWN DISPOSABLE) ×2 IMPLANT
GOWN STRL REUS W/TWL LRG LVL3 (GOWN DISPOSABLE) ×6
KIT BASIN OR (CUSTOM PROCEDURE TRAY) ×3 IMPLANT
KIT ROOM TURNOVER OR (KITS) ×3 IMPLANT
NDL HYPO 25GX1X1/2 BEV (NEEDLE) ×1 IMPLANT
NEEDLE HYPO 25GX1X1/2 BEV (NEEDLE) ×3 IMPLANT
NS IRRIG 1000ML POUR BTL (IV SOLUTION) ×3 IMPLANT
PACK SURGICAL SETUP 50X90 (CUSTOM PROCEDURE TRAY) ×3 IMPLANT
PAD ARMBOARD 7.5X6 YLW CONV (MISCELLANEOUS) ×6 IMPLANT
PENCIL BUTTON HOLSTER BLD 10FT (ELECTRODE) ×3 IMPLANT
SPONGE GAUZE 2X2 STER 10/PKG (GAUZE/BANDAGES/DRESSINGS) ×2
STRIP CLOSURE SKIN 1/2X4 (GAUZE/BANDAGES/DRESSINGS) ×2 IMPLANT
SUT MON AB 4-0 PC3 18 (SUTURE) ×3 IMPLANT
SUT VIC AB 3-0 SH 27 (SUTURE) ×3
SUT VIC AB 3-0 SH 27X BRD (SUTURE) ×1 IMPLANT
SYR CONTROL 10ML LL (SYRINGE) ×3 IMPLANT
TOWEL OR 17X24 6PK STRL BLUE (TOWEL DISPOSABLE) ×3 IMPLANT
TOWEL OR 17X26 10 PK STRL BLUE (TOWEL DISPOSABLE) ×3 IMPLANT
WATER STERILE IRR 1000ML POUR (IV SOLUTION) IMPLANT

## 2016-09-25 NOTE — Interval H&P Note (Signed)
History and Physical Interval Note:  09/25/2016 7:21 AM  Daisy [name]  has presented today for surgery, with the diagnosis of Indwelling Port-a-cath  The various methods of treatment have been discussed with the patient and family. After consideration of risks, benefits and other options for treatment, the patient has consented to  Procedure(s): REMOVAL PORT-A-CATH (N/A) as a surgical intervention .  The patient's history has been reviewed, patient examined, no change in status, stable for surgery.  I have reviewed the patient's chart and labs.  Questions were answered to the patient's satisfaction.     Venna Berberich Lenna Sciara

## 2016-09-25 NOTE — Discharge Instructions (Signed)
Do not lie flat for 6 hours.  Use Tylenol or Ibuprofen as needed for pain.  May shower.  Remove bandage in 3 days.  Remove steri strips in 2 weeks.  Apply ice to the area for the next 2 days.  Light activities with the right arm until you are pain-free, then resume all activities.  Call for heavy bleeding or other wound problems.  Follow up in office in one month for a wound check.  Please call [phone]) and make an appointment.

## 2016-09-25 NOTE — Anesthesia Procedure Notes (Signed)
Procedure Name: MAC Date/Time: 09/25/2016 7:35 AM Performed by: Babs Bertin Pre-anesthesia Checklist: Patient identified, Emergency Drugs available, Suction available, Patient being monitored and Timeout performed Patient Re-evaluated:Patient Re-evaluated prior to inductionOxygen Delivery Method: Nasal cannula

## 2016-09-25 NOTE — Op Note (Signed)
OPERATIVE NOTE:  Port-a-cath removal  PREOPERATIVE DIAGNOSIS:  Retain Port-a-cath.  POSTOPERATIVE DIAGNOSIS:  Same  PROCEDURE:    Porta-cath removal  SURGEON:  Jackolyn Confer, M.D.  ANESTHESIA:  Local with MAC  INDICATION:  This is a 65 year old female with an indwelling Port-a-cath because of poor venous access who no longer needs it.  She now presents for removal.  The procedure, risks, and aftercare have been explained preoperatively.   The right upper chest wall and neck were sterilely prepped and draped. Local anesthetic was infiltrated at the site of the port in the chest wall superficially and deep. The previous scar was incised sharply and then using electrocautery the subcutaneous tissue was divided until the port and catheter were identified.  The fibrous sheath was dissected free from the catheter and it was pulled out of the internal jugular/subclavian vein. Direct pressure was held over the vein site for 10-15 minutes.  The port was then dissected free from the chest wall using electrocautery. The port and catheter were removed intact. The chest wall area was inspected and bleeding controlled with electrocautery. Once hemostasis was adequate, the chest wall incision was closed in 2 layers. The subcutaneous tissues approximated with running 3-0 Vicryl suture. The skin was closed with a running 4-0 Monocryl subcuticular stitch. Steri-Strips and a sterile dressing were applied.  She tolerated the procedure well without any apparent complications and was taken to the recovery room in satisfactory condition.

## 2016-09-25 NOTE — Transfer of Care (Signed)
Immediate Anesthesia Transfer of Care Note  Patient: LEWIS GLERUM  Procedure(s) Performed: Procedure(s): REMOVAL PORT-A-CATH RIGHT (Right)  Patient Location: PACU  Anesthesia Type:MAC  Level of Consciousness: responds to stimulation  Airway & Oxygen Therapy: Patient Spontanous Breathing and Patient connected to nasal cannula oxygen  Post-op Assessment: Report given to RN and Post -op Vital signs reviewed and stable  Post vital signs: Reviewed and stable  Last Vitals:  Vitals:   09/25/16 0651  BP: 128/63  Pulse: 67  Resp: 20  Temp: 36.6 C    Last Pain:  Vitals:   09/25/16 0651  TempSrc: Oral      Patients Stated Pain Goal: 3 ([id number])  Complications: No apparent anesthesia complications

## 2016-09-25 NOTE — H&P (Signed)
H & P  Daisy [name] was referred by Dr. Marko Plume for consultation regarding Port-a-cath [facility]) removal. Daisy [name]'s had the Port-A-Cath indwelling for many years because of poor IV access. Daisy [name] is doing much better overall and has lost a significant amount of weight. Peripheral IV access now is much easier. Daisy [name] no longer needs to Port-A-Cath.  Allergies  Sulfa 10 *OPHTHALMIC AGENTS* hallucinations Augmentin *PENICILLINS* bad head ache Iodinated Contrast Media Anaphylaxis. Reglan *GASTROINTESTINAL AGENTS - MISC.* hyperactivity Lisinopril *ANTIHYPERTENSIVES* Cough. Phenergan *ANTIHISTAMINES* anxiety Tussin *COUGH/COLD/ALLERGY* head ache LamISIL *ANTIFUNGALS* Anaphylaxis. Dilaudid *ANALGESICS - OPIOID* Vomiting. Ambien *HYPNOTICS/SEDATIVES/SLEEP DISORDER AGENTS* shacky Vancomycin HCl *ANTI-INFECTIVE AGENTS - MISC.* Swelling. Biaxin *MACROLIDES* Amoxicillin *PENICILLINS* Diarrhea. Mucinex *COUGH/COLD/ALLERGY* shaky Aldara *DERMATOLOGICALS* sick Compazine *ANTIPSYCHOTICS/ANTIMANIC AGENTS* anxious, shaky MetFORMIN HCl *CHEMICALS* migraines  Prior to Admission medications   Medication Sig Start Date End Date Taking? Authorizing Provider  Calcium-Vitamin D-Vitamin K (VIACTIV) [id number] MG-UNT-MCG CHEW Chew 3 each by mouth daily at 12 noon.    Yes Historical Provider, MD  clonazePAM (KLONOPIN) 1 MG tablet Take 1 mg by mouth at bedtime.    Yes Historical Provider, MD  dexlansoprazole (DEXILANT) 60 MG capsule Take 60 mg by mouth daily.   Yes Historical Provider, MD  diphenhydrAMINE (BENADRYL) 25 MG tablet Take 50 mg by mouth every 6 (six) hours as needed for itching or allergies.   Yes Historical Provider, MD  hydrOXYzine (ATARAX/VISTARIL) 50 MG tablet Take 50-100 mg by mouth 4 (four) times daily as needed for itching (allergies).    Yes Historical Provider, MD  levothyroxine (SYNTHROID, LEVOTHROID) 50 MCG tablet Take 50 mcg by mouth every other day. Alternate with the 75mg  tablet always  take the 50mcg on even days per spouse   Yes Historical Provider, MD  levothyroxine (SYNTHROID, LEVOTHROID) 75 MCG tablet Take 75 mcg by mouth every other day.    Yes Historical Provider, MD  metoprolol tartrate (LOPRESSOR) 25 MG tablet Take 12.5-25 mg by mouth See admin instructions. 12.5 in the morning and 12.5 at bedtime but if blood pressure is high before bedtime Daisy [name] will take another half tab = to 25 mg   Yes Historical Provider, MD  morphine (MSIR) 30 MG tablet Take 30 mg by mouth 2 (two) times daily. TAKES AT 1 AM AND 1 PM   Yes Historical Provider, MD  nystatin (MYCOSTATIN/NYSTOP) 100000 UNIT/GM POWD Apply 1 g topically 2 (two) times daily. 09/25/14  Yes Jackolyn Confer, MD  predniSONE (DELTASONE) 1 MG tablet Take 4 mg by mouth daily with breakfast.   Yes Historical Provider, MD  senna (SENOKOT) 8.6 MG tablet Take 1 tablet by mouth 2 (two) times daily as needed.   Yes Historical Provider, MD  alendronate (FOSAMAX) 70 MG tablet Take 70 mg by mouth once a week. Take with a full glass of water on an empty stomach.    Historical Provider, MD  ondansetron (ZOFRAN ODT) 4 MG disintegrating tablet Take 1 tablet (4 mg total) by mouth every 6 (six) hours as needed for nausea or vomiting. 04/20/14   Autumn Messing III, MD      Physical Exam  The physical exam findings are as follows: Note:General: Slightly overweight female in NAD. Pleasant and cooperative.  EYES: no scleral icterus  CHEST: Bilateral mastectomy scars present. No chest wall nodules. Palpable Port-A-Cath in right upper chest wall.  NEUROLOGIC: Alert and oriented, answers questions appropriately, normal gait and station.  PSYCHIATRIC: Normal mood, affect , and behavior.    Assessment & Plan   PORT-A-CATH IN PLACE [phone]) Impression:  Daisy [name] no longer needs the PAC.  Plan: Outpatient PAC removal. The procedure and risks, including but not limited to bleeding, infection, wound healing problems have been discussed.  Daisy [name] seems to understand and would like to proceed.  Jackolyn Confer, MD

## 2016-09-26 ENCOUNTER — Encounter (HOSPITAL_COMMUNITY): Payer: Self-pay | Admitting: General Surgery

## 2016-09-28 NOTE — Anesthesia Postprocedure Evaluation (Addendum)
Anesthesia Post Note  Patient: Daisy [name]  Procedure(s) Performed: Procedure(s) (LRB): REMOVAL PORT-A-CATH RIGHT (Right)  Patient location during evaluation: PACU Anesthesia Type: MAC Level of consciousness: awake and alert Pain management: pain level controlled Vital Signs Assessment: post-procedure vital signs reviewed and stable Respiratory status: spontaneous breathing, nonlabored ventilation, respiratory function stable and patient connected to nasal cannula oxygen Cardiovascular status: stable and blood pressure returned to baseline Anesthetic complications: no    Last Vitals:  Vitals:   09/25/16 0819 09/25/16 0830  BP: (!) 145/78   Pulse: 67 71  Resp: 12 15  Temp:  36.6 C    Last Pain:  Vitals:   09/25/16 0806  TempSrc:   PainSc: Asleep                 Catalina Gravel

## 2016-09-28 NOTE — Anesthesia Preprocedure Evaluation (Addendum)
Anesthesia Evaluation  Patient identified by MRN, date of birth, ID band Patient awake    Reviewed: Allergy & Precautions, NPO status , Patient's Chart, lab work & pertinent test results, reviewed documented beta blocker date and time   History of Anesthesia Complications (+) PONV, POST - OP SPINAL HEADACHE and history of anesthetic complications  Airway Mallampati: II  TM Distance: >3 FB Neck ROM: Full    Dental  (+) Teeth Intact, Dental Advisory Given   Pulmonary asthma , sleep apnea and Continuous Positive Airway Pressure Ventilation , COPD,  oxygen dependent,    Pulmonary exam normal breath sounds clear to auscultation       Cardiovascular hypertension, Pt. on medications and Pt. on home beta blockers Normal cardiovascular exam Rhythm:Regular Rate:Normal     Neuro/Psych  Headaches, PSYCHIATRIC DISORDERS Anxiety MS    GI/Hepatic hiatal hernia, GERD  Medicated,(+) Hepatitis -, B  Endo/Other  Hypothyroidism Addison's disease  Renal/GU negative Renal ROS     Musculoskeletal  (+) Arthritis , Fibromyalgia -  Abdominal   Peds  Hematology  (+) anemia ,   Anesthesia Other Findings Day of surgery medications reviewed with the patient.  Reproductive/Obstetrics                             Anesthesia Physical Anesthesia Plan  ASA: III  Anesthesia Plan: MAC   Post-op Pain Management:    Induction: Intravenous  Airway Management Planned: Nasal Cannula  Additional Equipment:   Intra-op Plan:   Post-operative Plan:   Informed Consent: I have reviewed the patients History and Physical, chart, labs and discussed the procedure including the risks, benefits and alternatives for the proposed anesthesia with the patient or authorized representative who has indicated his/her understanding and acceptance.   Dental advisory given  Plan Discussed with: CRNA and Anesthesiologist  Anesthesia Plan  Comments: (Discussed risks/benefits/alternatives to MAC sedation including need for ventilatory support, hypotension, need for conversion to general anesthesia.  All patient questions answered.  Patient/guardian wishes to proceed.)        Anesthesia Quick Evaluation

## 2016-10-04 NOTE — Addendum Note (Signed)
Addendum  created 10/04/16 1707 by Lyndle Herrlich, MD   Sign clinical note

## 2016-11-09 ENCOUNTER — Other Ambulatory Visit: Payer: Self-pay | Admitting: Family Medicine

## 2016-11-09 DIAGNOSIS — M545 Low back pain: Secondary | ICD-10-CM

## 2016-11-10 ENCOUNTER — Ambulatory Visit
Admission: RE | Admit: 2016-11-10 | Discharge: 2016-11-10 | Disposition: A | Discharge status: Home / Self Care | Payer: 59 | Source: Ambulatory Visit | Attending: Family Medicine | Admitting: Family Medicine

## 2016-11-10 DIAGNOSIS — M545 Low back pain: Secondary | ICD-10-CM

## 2016-12-25 ENCOUNTER — Ambulatory Visit (HOSPITAL_COMMUNITY)
Admission: RE | Admit: 2016-12-25 | Discharge: 2016-12-25 | Disposition: A | Discharge status: Home / Self Care | Payer: 59 | Source: Ambulatory Visit | Attending: Internal Medicine | Admitting: Internal Medicine

## 2017-05-15 ENCOUNTER — Other Ambulatory Visit (HOSPITAL_COMMUNITY): Payer: Self-pay | Admitting: Neurological Surgery

## 2017-05-15 DIAGNOSIS — M5416 Radiculopathy, lumbar region: Secondary | ICD-10-CM

## 2017-05-31 ENCOUNTER — Ambulatory Visit (HOSPITAL_COMMUNITY)
Admission: RE | Admit: 2017-05-31 | Discharge: 2017-05-31 | Disposition: A | Discharge status: Home / Self Care | Payer: Managed Care, Other (non HMO) | Source: Ambulatory Visit | Attending: Neurological Surgery | Admitting: Neurological Surgery

## 2017-05-31 DIAGNOSIS — M5126 Other intervertebral disc displacement, lumbar region: Secondary | ICD-10-CM | POA: Insufficient documentation

## 2017-05-31 DIAGNOSIS — M5416 Radiculopathy, lumbar region: Secondary | ICD-10-CM | POA: Insufficient documentation

## 2017-05-31 DIAGNOSIS — M48061 Spinal stenosis, lumbar region without neurogenic claudication: Secondary | ICD-10-CM | POA: Insufficient documentation

## 2017-05-31 DIAGNOSIS — S32010A Wedge compression fracture of first lumbar vertebra, initial encounter for closed fracture: Secondary | ICD-10-CM | POA: Diagnosis not present

## 2017-05-31 DIAGNOSIS — X58XXXA Exposure to other specified factors, initial encounter: Secondary | ICD-10-CM | POA: Diagnosis not present

## 2017-05-31 DIAGNOSIS — K838 Other specified diseases of biliary tract: Secondary | ICD-10-CM | POA: Insufficient documentation

## 2017-05-31 LAB — POCT I-STAT CREATININE: Creatinine, Ser: 0.7 mg/dL (ref 0.44–1.00)

## 2017-05-31 MED ORDER — GADOBENATE DIMEGLUMINE 529 MG/ML IV SOLN
15.0000 mL | Freq: Once | INTRAVENOUS | Status: AC
  Administered 2017-05-31: 15 mL via INTRAVENOUS

## 2017-07-06 ENCOUNTER — Other Ambulatory Visit (HOSPITAL_COMMUNITY)
Admission: RE | Admit: 2017-07-06 | Discharge: 2017-07-06 | Disposition: A | Discharge status: Home / Self Care | Payer: Managed Care, Other (non HMO) | Source: Ambulatory Visit | Attending: Gastroenterology | Admitting: Gastroenterology

## 2017-07-06 ENCOUNTER — Other Ambulatory Visit: Payer: Self-pay | Admitting: Gastroenterology

## 2017-07-06 DIAGNOSIS — K209 Esophagitis, unspecified: Secondary | ICD-10-CM | POA: Insufficient documentation

## 2017-08-10 ENCOUNTER — Other Ambulatory Visit: Payer: Self-pay | Admitting: Physician Assistant

## 2017-08-10 DIAGNOSIS — K209 Esophagitis, unspecified: Secondary | ICD-10-CM | POA: Diagnosis not present

## 2017-08-10 DIAGNOSIS — R490 Dysphonia: Secondary | ICD-10-CM

## 2017-08-10 DIAGNOSIS — R1314 Dysphagia, pharyngoesophageal phase: Secondary | ICD-10-CM | POA: Diagnosis not present

## 2017-08-10 DIAGNOSIS — K224 Dyskinesia of esophagus: Secondary | ICD-10-CM

## 2017-08-10 DIAGNOSIS — B3781 Candidal esophagitis: Secondary | ICD-10-CM | POA: Diagnosis not present

## 2017-08-10 DIAGNOSIS — K2100 Gastro-esophageal reflux disease with esophagitis, without bleeding: Secondary | ICD-10-CM

## 2017-08-10 DIAGNOSIS — R1310 Dysphagia, unspecified: Secondary | ICD-10-CM

## 2017-08-15 ENCOUNTER — Ambulatory Visit
Admission: RE | Admit: 2017-08-15 | Discharge: 2017-08-15 | Disposition: A | Discharge status: Home / Self Care | Payer: Managed Care, Other (non HMO) | Source: Ambulatory Visit | Attending: Physician Assistant | Admitting: Physician Assistant

## 2017-08-15 DIAGNOSIS — K224 Dyskinesia of esophagus: Secondary | ICD-10-CM

## 2017-08-15 DIAGNOSIS — R4702 Dysphasia: Secondary | ICD-10-CM | POA: Diagnosis not present

## 2017-08-15 DIAGNOSIS — K2100 Gastro-esophageal reflux disease with esophagitis, without bleeding: Secondary | ICD-10-CM

## 2017-08-15 DIAGNOSIS — R490 Dysphonia: Secondary | ICD-10-CM

## 2017-08-15 DIAGNOSIS — R1310 Dysphagia, unspecified: Secondary | ICD-10-CM

## 2017-08-20 ENCOUNTER — Other Ambulatory Visit: Payer: Managed Care, Other (non HMO)

## 2017-08-20 DIAGNOSIS — M4726 Other spondylosis with radiculopathy, lumbar region: Secondary | ICD-10-CM | POA: Diagnosis not present

## 2017-08-20 DIAGNOSIS — M47812 Spondylosis without myelopathy or radiculopathy, cervical region: Secondary | ICD-10-CM | POA: Diagnosis not present

## 2017-08-20 DIAGNOSIS — R51 Headache: Secondary | ICD-10-CM | POA: Diagnosis not present

## 2017-08-20 DIAGNOSIS — Z79891 Long term (current) use of opiate analgesic: Secondary | ICD-10-CM | POA: Diagnosis not present

## 2017-08-20 DIAGNOSIS — G894 Chronic pain syndrome: Secondary | ICD-10-CM | POA: Diagnosis not present

## 2017-08-23 DIAGNOSIS — R933 Abnormal findings on diagnostic imaging of other parts of digestive tract: Secondary | ICD-10-CM | POA: Diagnosis not present

## 2017-08-23 DIAGNOSIS — K222 Esophageal obstruction: Secondary | ICD-10-CM | POA: Diagnosis not present

## 2017-08-23 DIAGNOSIS — R1310 Dysphagia, unspecified: Secondary | ICD-10-CM | POA: Diagnosis not present

## 2017-08-23 DIAGNOSIS — K210 Gastro-esophageal reflux disease with esophagitis: Secondary | ICD-10-CM | POA: Diagnosis not present

## 2017-08-23 DIAGNOSIS — K2930 Chronic superficial gastritis without bleeding: Secondary | ICD-10-CM | POA: Diagnosis not present

## 2017-08-23 DIAGNOSIS — K259 Gastric ulcer, unspecified as acute or chronic, without hemorrhage or perforation: Secondary | ICD-10-CM | POA: Diagnosis not present

## 2017-08-27 DIAGNOSIS — E230 Hypopituitarism: Secondary | ICD-10-CM | POA: Diagnosis not present

## 2017-08-27 DIAGNOSIS — Z8781 Personal history of (healed) traumatic fracture: Secondary | ICD-10-CM | POA: Diagnosis not present

## 2017-08-27 DIAGNOSIS — E039 Hypothyroidism, unspecified: Secondary | ICD-10-CM | POA: Diagnosis not present

## 2017-08-27 DIAGNOSIS — K219 Gastro-esophageal reflux disease without esophagitis: Secondary | ICD-10-CM | POA: Diagnosis not present

## 2017-08-27 DIAGNOSIS — M810 Age-related osteoporosis without current pathological fracture: Secondary | ICD-10-CM | POA: Diagnosis not present

## 2017-09-28 ENCOUNTER — Other Ambulatory Visit: Payer: Self-pay | Admitting: Gastroenterology

## 2017-10-01 NOTE — Progress Notes (Signed)
Appointment confirmed with patient for Wednesday for 10/03/2017 at 10:30. Pt reminded to be NPO except meds for at least 6 hours prior to procedure.

## 2017-10-03 ENCOUNTER — Encounter (HOSPITAL_COMMUNITY): Payer: Self-pay | Admitting: Gastroenterology

## 2017-10-03 ENCOUNTER — Encounter (HOSPITAL_COMMUNITY)
Admission: RE | Disposition: A | Discharge status: Home / Self Care | Payer: Self-pay | Source: Ambulatory Visit | Attending: Gastroenterology

## 2017-10-03 ENCOUNTER — Ambulatory Visit (HOSPITAL_COMMUNITY)
Admission: RE | Admit: 2017-10-03 | Discharge: 2017-10-03 | Disposition: A | Discharge status: Home / Self Care | Payer: Managed Care, Other (non HMO) | Source: Ambulatory Visit | Attending: Gastroenterology | Admitting: Gastroenterology

## 2017-10-03 DIAGNOSIS — R490 Dysphonia: Secondary | ICD-10-CM | POA: Insufficient documentation

## 2017-10-03 DIAGNOSIS — G35 Multiple sclerosis: Secondary | ICD-10-CM | POA: Insufficient documentation

## 2017-10-03 DIAGNOSIS — K210 Gastro-esophageal reflux disease with esophagitis: Secondary | ICD-10-CM | POA: Diagnosis not present

## 2017-10-03 DIAGNOSIS — R110 Nausea: Secondary | ICD-10-CM | POA: Diagnosis not present

## 2017-10-03 DIAGNOSIS — K224 Dyskinesia of esophagus: Secondary | ICD-10-CM | POA: Insufficient documentation

## 2017-10-03 DIAGNOSIS — R1314 Dysphagia, pharyngoesophageal phase: Secondary | ICD-10-CM | POA: Diagnosis not present

## 2017-10-03 HISTORY — PX: ESOPHAGEAL MANOMETRY: SHX5429

## 2017-10-03 SURGERY — MANOMETRY, ESOPHAGUS

## 2017-10-03 MED ORDER — LIDOCAINE VISCOUS 2 % MT SOLN
OROMUCOSAL | Status: AC
  Filled 2017-10-03: qty 15

## 2017-10-03 SURGICAL SUPPLY — 2 items
FACESHIELD LNG OPTICON STERILE (SAFETY) IMPLANT
GLOVE BIO SURGEON STRL SZ8 (GLOVE) ×6 IMPLANT

## 2017-10-03 NOTE — Progress Notes (Signed)
Esophageal Manometry done per protocol.  Pt. Tolerated procedure well, w/o complications.  Dr. Michail Sermon to be notified today of study.  Laverta Baltimore, RN

## 2017-10-04 ENCOUNTER — Encounter (HOSPITAL_COMMUNITY): Payer: Self-pay | Admitting: Gastroenterology

## 2017-10-04 DIAGNOSIS — R1314 Dysphagia, pharyngoesophageal phase: Secondary | ICD-10-CM | POA: Diagnosis not present

## 2017-10-04 DIAGNOSIS — K210 Gastro-esophageal reflux disease with esophagitis: Secondary | ICD-10-CM | POA: Diagnosis not present

## 2017-10-04 NOTE — H&P (Signed)
HPI: 66 year old female, established patient Dr. Amedeo Plenty, returns for follow-up of dysphagia. She had an EGD on 08/23/17 with dilatation.  She had EGD done 07/06/17 with Dr. Amedeo Plenty which showed moderate to severe distal esophagitis with evidence of candidiasis. There was no obvious esophageal stricture and a dilation was not performed. Stomach and duodenum were normal. Esophageal biopsies showed squamous mucosa with eosinophilic superficial zone with focal necrotic nuclei, negative for fungal GI and metaplasia.  The concentration of eosinophils did not meet diagnostic criteria for eosinophilic esophagitis. Findings were suggestive of sloughing esophagitis which can be associated with chronic medications that injure the esophageal mucosa. She was treated with Diflucan 150mg  once daily for 7 days with some benefit. She is also currently taking Dexilant 60mg  once daily, Carafate twice daily, and Zantac twice daily. Patient reports recurrent dysphagia with solids and liquids.  She reports decreased appetite and nausea. Food is getting stuck in her throat. She reports voice hoarseness since March 2018.  She has history of multiple sclerosis and there has been concern about related esophageal dysmotility. She has had esophageal dilations in the past for distal esophageal strictures. Lab work done 07/30/17 showed normal CBC, CMP, and TSH. Patient has not had a recent barium swallow test or ENT evaluation.   Vital Signs  Wt Declined, Ht 61, Temp 98.1, Pulse sitting 80, BP sitting 108/80.   Examination  Gastroenterology Exam: GENERAL APPEARANCE: Well developed, well nourished, no active distress, pleasant, no acute distress.  ORAL CAVITY: Lips, teeth and gums are normal. Pharynx, tongue, mucosa normal. No evidence of thrush. Voice is hoarse. Marland Kitchen  SCLERA: anicteric .  NECK Full ROM, trachea midline, no thyromegaly or masses.  RESPIRATORY Breath sounds clear to auscultation. No wheezes, rales or rhonchi. Respiration  even and unlabored .  CARDIOVASCULAR Normal RRR w/o murmers or gallops. No peripheral edema .  ABDOMEN No masses palpated. Liver and spleen not palpated, normal. Bowel sounds normal, Abdomen not distended and not tender..     Assessments   1. Dysphagia, pharyngoesophageal phase - R13.14 (Primary)   2. Candida esophagitis - B37.81, Questionable diagnosis.   3. Gastroesophageal reflux disease with esophagitis - K21.0   4. Esophageal dysmotility - K22.4, Secondary to MS?   5. Voice hoarseness - R49.0   6. Esophagitis - K20.9, Due to GERD verses adverse affects of chronic medications.   Plan:   Esophageal manometry.  Ronnette Juniper, MD

## 2017-10-12 ENCOUNTER — Inpatient Hospital Stay (HOSPITAL_COMMUNITY)
Admission: EM | Admit: 2017-10-12 | Discharge: 2017-10-18 | DRG: 552 | Disposition: A | Discharge status: Home Health | Payer: Managed Care, Other (non HMO) | Attending: Internal Medicine | Admitting: Internal Medicine

## 2017-10-12 ENCOUNTER — Other Ambulatory Visit: Payer: Self-pay

## 2017-10-12 ENCOUNTER — Emergency Department (HOSPITAL_COMMUNITY): Payer: Managed Care, Other (non HMO)

## 2017-10-12 ENCOUNTER — Encounter (HOSPITAL_COMMUNITY): Payer: Self-pay | Admitting: Emergency Medicine

## 2017-10-12 DIAGNOSIS — Z882 Allergy status to sulfonamides status: Secondary | ICD-10-CM

## 2017-10-12 DIAGNOSIS — I10 Essential (primary) hypertension: Secondary | ICD-10-CM | POA: Diagnosis present

## 2017-10-12 DIAGNOSIS — Z9221 Personal history of antineoplastic chemotherapy: Secondary | ICD-10-CM

## 2017-10-12 DIAGNOSIS — M797 Fibromyalgia: Secondary | ICD-10-CM | POA: Diagnosis present

## 2017-10-12 DIAGNOSIS — Z90722 Acquired absence of ovaries, bilateral: Secondary | ICD-10-CM

## 2017-10-12 DIAGNOSIS — R200 Anesthesia of skin: Secondary | ICD-10-CM | POA: Diagnosis present

## 2017-10-12 DIAGNOSIS — Z801 Family history of malignant neoplasm of trachea, bronchus and lung: Secondary | ICD-10-CM

## 2017-10-12 DIAGNOSIS — M47816 Spondylosis without myelopathy or radiculopathy, lumbar region: Secondary | ICD-10-CM | POA: Diagnosis present

## 2017-10-12 DIAGNOSIS — M62838 Other muscle spasm: Secondary | ICD-10-CM | POA: Diagnosis present

## 2017-10-12 DIAGNOSIS — K5900 Constipation, unspecified: Secondary | ICD-10-CM | POA: Diagnosis present

## 2017-10-12 DIAGNOSIS — M549 Dorsalgia, unspecified: Secondary | ICD-10-CM | POA: Diagnosis not present

## 2017-10-12 DIAGNOSIS — Z885 Allergy status to narcotic agent status: Secondary | ICD-10-CM

## 2017-10-12 DIAGNOSIS — K219 Gastro-esophageal reflux disease without esophagitis: Secondary | ICD-10-CM | POA: Diagnosis not present

## 2017-10-12 DIAGNOSIS — M545 Low back pain, unspecified: Secondary | ICD-10-CM

## 2017-10-12 DIAGNOSIS — G35 Multiple sclerosis: Secondary | ICD-10-CM | POA: Diagnosis present

## 2017-10-12 DIAGNOSIS — S29002A Unspecified injury of muscle and tendon of back wall of thorax, initial encounter: Secondary | ICD-10-CM | POA: Diagnosis not present

## 2017-10-12 DIAGNOSIS — Q780 Osteogenesis imperfecta: Secondary | ICD-10-CM

## 2017-10-12 DIAGNOSIS — E079 Disorder of thyroid, unspecified: Secondary | ICD-10-CM

## 2017-10-12 DIAGNOSIS — Z803 Family history of malignant neoplasm of breast: Secondary | ICD-10-CM

## 2017-10-12 DIAGNOSIS — R338 Other retention of urine: Secondary | ICD-10-CM | POA: Diagnosis present

## 2017-10-12 DIAGNOSIS — Z923 Personal history of irradiation: Secondary | ICD-10-CM

## 2017-10-12 DIAGNOSIS — Z79890 Hormone replacement therapy (postmenopausal): Secondary | ICD-10-CM

## 2017-10-12 DIAGNOSIS — S32019A Unspecified fracture of first lumbar vertebra, initial encounter for closed fracture: Principal | ICD-10-CM | POA: Diagnosis present

## 2017-10-12 DIAGNOSIS — Z8701 Personal history of pneumonia (recurrent): Secondary | ICD-10-CM

## 2017-10-12 DIAGNOSIS — M810 Age-related osteoporosis without current pathological fracture: Secondary | ICD-10-CM | POA: Diagnosis present

## 2017-10-12 DIAGNOSIS — Z853 Personal history of malignant neoplasm of breast: Secondary | ICD-10-CM

## 2017-10-12 DIAGNOSIS — Z90710 Acquired absence of both cervix and uterus: Secondary | ICD-10-CM

## 2017-10-12 DIAGNOSIS — M5136 Other intervertebral disc degeneration, lumbar region: Secondary | ICD-10-CM

## 2017-10-12 DIAGNOSIS — Z9013 Acquired absence of bilateral breasts and nipples: Secondary | ICD-10-CM

## 2017-10-12 DIAGNOSIS — S32010A Wedge compression fracture of first lumbar vertebra, initial encounter for closed fracture: Secondary | ICD-10-CM

## 2017-10-12 DIAGNOSIS — N390 Urinary tract infection, site not specified: Secondary | ICD-10-CM | POA: Diagnosis present

## 2017-10-12 DIAGNOSIS — Z79899 Other long term (current) drug therapy: Secondary | ICD-10-CM

## 2017-10-12 DIAGNOSIS — M51369 Other intervertebral disc degeneration, lumbar region without mention of lumbar back pain or lower extremity pain: Secondary | ICD-10-CM

## 2017-10-12 DIAGNOSIS — Z91041 Radiographic dye allergy status: Secondary | ICD-10-CM

## 2017-10-12 DIAGNOSIS — Z880 Allergy status to penicillin: Secondary | ICD-10-CM

## 2017-10-12 DIAGNOSIS — G4733 Obstructive sleep apnea (adult) (pediatric): Secondary | ICD-10-CM | POA: Diagnosis present

## 2017-10-12 DIAGNOSIS — Z91048 Other nonmedicinal substance allergy status: Secondary | ICD-10-CM

## 2017-10-12 DIAGNOSIS — E271 Primary adrenocortical insufficiency: Secondary | ICD-10-CM | POA: Diagnosis not present

## 2017-10-12 DIAGNOSIS — M419 Scoliosis, unspecified: Secondary | ICD-10-CM | POA: Diagnosis present

## 2017-10-12 DIAGNOSIS — Z87892 Personal history of anaphylaxis: Secondary | ICD-10-CM

## 2017-10-12 DIAGNOSIS — E039 Hypothyroidism, unspecified: Secondary | ICD-10-CM | POA: Diagnosis present

## 2017-10-12 DIAGNOSIS — Z87442 Personal history of urinary calculi: Secondary | ICD-10-CM

## 2017-10-12 DIAGNOSIS — G8929 Other chronic pain: Secondary | ICD-10-CM | POA: Diagnosis present

## 2017-10-12 DIAGNOSIS — K210 Gastro-esophageal reflux disease with esophagitis: Secondary | ICD-10-CM | POA: Diagnosis present

## 2017-10-12 DIAGNOSIS — J449 Chronic obstructive pulmonary disease, unspecified: Secondary | ICD-10-CM | POA: Diagnosis present

## 2017-10-12 DIAGNOSIS — Z9689 Presence of other specified functional implants: Secondary | ICD-10-CM | POA: Diagnosis present

## 2017-10-12 DIAGNOSIS — K224 Dyskinesia of esophagus: Secondary | ICD-10-CM | POA: Diagnosis present

## 2017-10-12 DIAGNOSIS — B3781 Candidal esophagitis: Secondary | ICD-10-CM | POA: Diagnosis present

## 2017-10-12 DIAGNOSIS — Z888 Allergy status to other drugs, medicaments and biological substances status: Secondary | ICD-10-CM

## 2017-10-12 DIAGNOSIS — Z9049 Acquired absence of other specified parts of digestive tract: Secondary | ICD-10-CM

## 2017-10-12 DIAGNOSIS — Z881 Allergy status to other antibiotic agents status: Secondary | ICD-10-CM

## 2017-10-12 DIAGNOSIS — Z808 Family history of malignant neoplasm of other organs or systems: Secondary | ICD-10-CM

## 2017-10-12 DIAGNOSIS — Z7952 Long term (current) use of systemic steroids: Secondary | ICD-10-CM

## 2017-10-12 DIAGNOSIS — M4186 Other forms of scoliosis, lumbar region: Secondary | ICD-10-CM | POA: Diagnosis present

## 2017-10-12 LAB — URINALYSIS, ROUTINE W REFLEX MICROSCOPIC
Bilirubin Urine: NEGATIVE
GLUCOSE, UA: NEGATIVE mg/dL
Hgb urine dipstick: NEGATIVE
Ketones, ur: NEGATIVE mg/dL
LEUKOCYTES UA: NEGATIVE
NITRITE: NEGATIVE
PH: 6 (ref 5.0–8.0)
Protein, ur: NEGATIVE mg/dL
SPECIFIC GRAVITY, URINE: 1.016 (ref 1.005–1.030)

## 2017-10-12 LAB — COMPREHENSIVE METABOLIC PANEL
ALBUMIN: 3.8 g/dL (ref 3.5–5.0)
ALT: 14 U/L (ref 14–54)
ANION GAP: 10 (ref 5–15)
AST: 21 U/L (ref 15–41)
Alkaline Phosphatase: 84 U/L (ref 38–126)
BUN: 19 mg/dL (ref 6–20)
CHLORIDE: 99 mmol/L — AB (ref 101–111)
CO2: 26 mmol/L (ref 22–32)
Calcium: 9.1 mg/dL (ref 8.9–10.3)
Creatinine, Ser: 0.67 mg/dL (ref 0.44–1.00)
GFR calc Af Amer: >60 mL/min (ref >60)
GFR calc non Af Amer: >60 mL/min (ref >60)
GLUCOSE: 116 mg/dL — AB (ref 65–99)
POTASSIUM: 4.9 mmol/L (ref 3.5–5.1)
SODIUM: 135 mmol/L (ref 135–145)
Total Bilirubin: 0.2 mg/dL — ABNORMAL LOW (ref 0.3–1.2)
Total Protein: 7.4 g/dL (ref 6.5–8.1)

## 2017-10-12 LAB — CBC WITH DIFFERENTIAL/PLATELET
BASOS ABS: 0 10*3/uL (ref 0.0–0.1)
BASOS PCT: 0 %
EOS ABS: 0.1 10*3/uL (ref 0.0–0.7)
EOS PCT: 1 %
HCT: 39.4 % (ref 36.0–46.0)
Hemoglobin: 13.3 g/dL (ref 12.0–15.0)
Lymphocytes Relative: 21 %
Lymphs Abs: 1.6 10*3/uL (ref 0.7–4.0)
MCH: 27.8 pg (ref 26.0–34.0)
MCHC: 33.8 g/dL (ref 30.0–36.0)
MCV: 82.3 fL (ref 78.0–100.0)
MONO ABS: 0.4 10*3/uL (ref 0.1–1.0)
Monocytes Relative: 5 %
NEUTROS ABS: 5.8 10*3/uL (ref 1.7–7.7)
Neutrophils Relative %: 73 %
PLATELETS: 246 10*3/uL (ref 150–400)
RBC: 4.79 MIL/uL (ref 3.87–5.11)
RDW: 13.4 % (ref 11.5–15.5)
WBC: 7.9 10*3/uL (ref 4.0–10.5)

## 2017-10-12 LAB — I-STAT CG4 LACTIC ACID, ED: LACTIC ACID, VENOUS: 1.29 mmol/L (ref 0.5–1.9)

## 2017-10-12 LAB — LIPASE, BLOOD: Lipase: 17 U/L (ref 11–51)

## 2017-10-12 MED ORDER — PREDNISONE 1 MG PO TABS
4.0000 mg | ORAL_TABLET | Freq: Every day | ORAL | Status: DC
  Filled 2017-10-12: qty 4

## 2017-10-12 MED ORDER — LIDOCAINE 5 % EX PTCH
1.0000 | MEDICATED_PATCH | CUTANEOUS | Status: DC
  Administered 2017-10-12 – 2017-10-17 (×6): 1 via TRANSDERMAL
  Filled 2017-10-12 (×7): qty 1

## 2017-10-12 MED ORDER — DEXTROSE 5 % IV SOLN
500.0000 mg | Freq: Three times a day (TID) | INTRAVENOUS | Status: DC | PRN
  Administered 2017-10-12 – 2017-10-15 (×6): 500 mg via INTRAVENOUS
  Filled 2017-10-12: qty 550
  Filled 2017-10-12: qty 5
  Filled 2017-10-12 (×2): qty 550
  Filled 2017-10-12: qty 5
  Filled 2017-10-12: qty 550
  Filled 2017-10-12: qty 5
  Filled 2017-10-12: qty 550

## 2017-10-12 MED ORDER — NALOXONE HCL 0.4 MG/ML IJ SOLN: 0.4000 mg | INTRAMUSCULAR | Status: DC | PRN

## 2017-10-12 MED ORDER — ENOXAPARIN SODIUM 40 MG/0.4ML ~~LOC~~ SOLN
40.0000 mg | SUBCUTANEOUS | Status: DC
  Administered 2017-10-12 – 2017-10-17 (×6): 40 mg via SUBCUTANEOUS
  Filled 2017-10-12 (×6): qty 0.4

## 2017-10-12 MED ORDER — DIAZEPAM 5 MG PO TABS
5.0000 mg | ORAL_TABLET | Freq: Once | ORAL | Status: AC
  Administered 2017-10-12: 5 mg via ORAL
  Filled 2017-10-12: qty 1

## 2017-10-12 MED ORDER — METHOCARBAMOL 1000 MG/10ML IJ SOLN
500.0000 mg | Freq: Once | INTRAMUSCULAR | Status: AC
  Administered 2017-10-12: 500 mg via INTRAMUSCULAR
  Filled 2017-10-12: qty 5

## 2017-10-12 MED ORDER — MORPHINE SULFATE (PF) 4 MG/ML IV SOLN
6.0000 mg | Freq: Once | INTRAVENOUS | Status: AC
  Administered 2017-10-12: 6 mg via INTRAVENOUS
  Filled 2017-10-12: qty 2

## 2017-10-12 MED ORDER — LEVOTHYROXINE SODIUM 75 MCG PO TABS
75.0000 ug | ORAL_TABLET | ORAL | Status: DC
  Administered 2017-10-14 – 2017-10-18 (×3): 75 ug via ORAL
  Filled 2017-10-12 (×3): qty 1

## 2017-10-12 MED ORDER — SODIUM CHLORIDE 0.9 % IV BOLUS (SEPSIS)
1000.0000 mL | Freq: Once | INTRAVENOUS | Status: AC
  Administered 2017-10-12: 1000 mL via INTRAVENOUS

## 2017-10-12 MED ORDER — HYDROXYZINE HCL 25 MG PO TABS
50.0000 mg | ORAL_TABLET | Freq: Three times a day (TID) | ORAL | Status: DC
  Administered 2017-10-12 – 2017-10-18 (×18): 50 mg via ORAL
  Filled 2017-10-12 (×18): qty 2

## 2017-10-12 MED ORDER — HYDROXYZINE HCL 25 MG PO TABS
50.0000 mg | ORAL_TABLET | Freq: Once | ORAL | Status: AC
  Administered 2017-10-12: 50 mg via ORAL
  Filled 2017-10-12: qty 2

## 2017-10-12 MED ORDER — FLUCONAZOLE 100 MG PO TABS
200.0000 mg | ORAL_TABLET | Freq: Every day | ORAL | Status: DC
  Administered 2017-10-12: 200 mg via ORAL
  Filled 2017-10-12: qty 2

## 2017-10-12 MED ORDER — KETOROLAC TROMETHAMINE 15 MG/ML IJ SOLN
15.0000 mg | Freq: Three times a day (TID) | INTRAMUSCULAR | Status: DC | PRN
  Administered 2017-10-12: 21:00:00 15 mg via INTRAVENOUS
  Filled 2017-10-12: qty 1

## 2017-10-12 MED ORDER — CALCIUM CARBONATE-VITAMIN D 500-200 MG-UNIT PO TABS
1.0000 | ORAL_TABLET | Freq: Every day | ORAL | Status: DC
  Administered 2017-10-14 – 2017-10-18 (×5): 1 via ORAL
  Filled 2017-10-12 (×5): qty 1

## 2017-10-12 MED ORDER — LOSARTAN POTASSIUM 50 MG PO TABS
100.0000 mg | ORAL_TABLET | Freq: Every day | ORAL | Status: DC
  Administered 2017-10-13 – 2017-10-18 (×6): 100 mg via ORAL
  Filled 2017-10-12 (×6): qty 2

## 2017-10-12 MED ORDER — PREDNISONE 5 MG/5ML PO SOLN
4.0000 mg | Freq: Every day | ORAL | Status: DC
  Administered 2017-10-13 – 2017-10-16 (×4): 4 mg via ORAL
  Filled 2017-10-12 (×5): qty 4

## 2017-10-12 MED ORDER — FAMOTIDINE 20 MG PO TABS
20.0000 mg | ORAL_TABLET | Freq: Two times a day (BID) | ORAL | Status: DC
  Administered 2017-10-12 – 2017-10-18 (×12): 20 mg via ORAL
  Filled 2017-10-12 (×12): qty 1

## 2017-10-12 MED ORDER — PANTOPRAZOLE SODIUM 40 MG PO TBEC
40.0000 mg | DELAYED_RELEASE_TABLET | Freq: Every day | ORAL | Status: DC
  Administered 2017-10-13 – 2017-10-18 (×6): 40 mg via ORAL
  Filled 2017-10-12 (×6): qty 1

## 2017-10-12 MED ORDER — CALCIUM-VITAMIN D-VITAMIN K 500-500-40 MG-UNT-MCG PO CHEW: 3.0000 | CHEWABLE_TABLET | Freq: Every day | ORAL | Status: DC

## 2017-10-12 MED ORDER — ONDANSETRON HCL 4 MG/2ML IJ SOLN
4.0000 mg | Freq: Once | INTRAMUSCULAR | Status: AC
  Administered 2017-10-12: 4 mg via INTRAVENOUS
  Filled 2017-10-12: qty 2

## 2017-10-12 MED ORDER — FLUCONAZOLE 40 MG/ML PO SUSR: 200.0000 mg | Freq: Every day | ORAL | Status: DC

## 2017-10-12 MED ORDER — MORPHINE SULFATE (PF) 4 MG/ML IV SOLN
8.0000 mg | INTRAVENOUS | Status: DC | PRN
  Administered 2017-10-12 – 2017-10-13 (×2): 8 mg via INTRAVENOUS
  Filled 2017-10-12 (×2): qty 2

## 2017-10-12 MED ORDER — FLUCONAZOLE 40 MG/ML PO SUSR
200.0000 mg | Freq: Every day | ORAL | Status: AC
  Administered 2017-10-12 – 2017-10-17 (×6): 200 mg via ORAL
  Filled 2017-10-12 (×6): qty 5

## 2017-10-12 MED ORDER — LEVOTHYROXINE SODIUM 50 MCG PO TABS
50.0000 ug | ORAL_TABLET | ORAL | Status: DC
  Administered 2017-10-13 – 2017-10-17 (×3): 50 ug via ORAL
  Filled 2017-10-12 (×5): qty 1

## 2017-10-12 MED ORDER — NOREPINEPHRINE BITARTRATE 1 MG/ML IV SOLN: 0.0000 ug/min | Freq: Once | INTRAVENOUS | Status: DC

## 2017-10-12 MED ORDER — ACETAMINOPHEN 500 MG PO TABS
1000.0000 mg | ORAL_TABLET | Freq: Three times a day (TID) | ORAL | Status: DC
  Administered 2017-10-12 – 2017-10-18 (×17): 1000 mg via ORAL
  Filled 2017-10-12 (×17): qty 2

## 2017-10-12 NOTE — ED Triage Notes (Signed)
Presents via [facility] c/o lower back pain. Pt has chronic back pain with a spinal stimulator in place but reports pain has worsened over the past week after she had a procedure that required her to sit upright for an extended period of time. Pt reports pain radiates down both legs.

## 2017-10-12 NOTE — ED Notes (Signed)
Pt requested [place] instead of being cathed

## 2017-10-12 NOTE — ED Notes (Signed)
Hospitalist at bedside 

## 2017-10-12 NOTE — ED Notes (Signed)
Bed: XL21 Expected date:  Expected time:  Means of arrival:  Comments: 66 yo back pain

## 2017-10-12 NOTE — ED Provider Notes (Signed)
[facility] DEPT Provider Note   CSN: [id number] Arrival date & time: 10/12/17  1048     History   Chief Complaint Chief Complaint  Patient presents with  . Back Pain    HPI Daisy [name] is a 66 y.o. female.  HPI   66 year old female with extensive past medical history as below including Addison's, chronic steroid use, history of chronic back pain on oral morphine, who presents with worsening back pain.  The patient reportedly recently underwent esophageal manometry.  During the procedure, she noticed that her lower lumbar back began hurting significantly.  She has since had progressively worsening pain.  She describes it as aching, throbbing, severe pain.  It radiates down her bilateral legs.  She has a history of known L1 compression fracture and states that her pain is similar to that, only worse.  She has been unable to ambulate.  She has been urinating on herself due to difficulty ambulating.  She denies any fevers or chills.  No new saddle anesthesia.  She just had an MRI several months ago, which did show severe lumbar degenerative disease, as well as 40% compression fracture.  She sees Dr. Ellene Route with neurosurgery.  Past Medical History:  Diagnosis Date  . Addison's disease ([place])   . Adenocarcinoma of breast ([place]) 07/13/1992   LEFT LUMPECTOMY AND CHEMO AND RADIATION;   NOW HAS RIGHT BREAST MASS 09/25/14 - PLANS MASTECTOMY   . Adrenal insufficiency ([place])    "w/secondary Addison's"  . Anemia   . Anxiety   . Arthritis   . Asthma   . Brittle bone disease   . Bruises easily   . Chronic bronchitis   . Chronic pain    DR. MARK PHILLIPS WITH PAIN CLINIC -  CHRONIC BACK PAIN AND SCOLIOSIS AND PREVIOUS BACK SURGERY AND NOW LUMBAR FRACTURE - PT HAS MED[employer]ON[employer] SPINAL STIMULATOR WHICH SHE CAN TURN ON AND OFF ; PT ON MORPINE AND CLONAZEPAM AND ROBAXIN  . COPD (chronic obstructive pulmonary disease) ([place])   . Falls frequently   . Fibromyalgia   .  Fracture    LUMBAR 1 - FROM A FALL JUNE 2015 - PT HAS A LOT OF SEVERE BACK PAIN AND DIFFICULTY WALKING  . GERD (gastroesophageal reflux disease)   . H/O esophageal spasm    "I can have as many as 66 TUMS before they stop"  . H/O hiatal hernia   . Headache(784.0)    "when I don't eat" (02/19/12)  . 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 History of urinary urgency    AZO HELPS - DOES TURN URINE ORANGE; DR. Risa Steffensmeier IS PT'S UROLOGIST  . Hypertension   . Hypothyroidism   . Kidney stones    previous lithotripsy rt 30 + yrs ago - has bilateral stones now.  . Migraines    "much better now" (02/19/12)  . Multiple sclerosis ([place]) 05/1988   "dr's in [place] don't think I have it; dr's in [place] still do" (02/19/12);  VERITGO, FALLS, DIFFICULTY WALKING, URINARY URGENCY, BLURRED VISION--DR. CARMEN DOHMEIER NEUROLOGIST- HAS NOT TOLERATED USUAL MEDS TO TREAT MS AND NOT ON ANY MS MEDS AT PRESENT  . On home oxygen therapy    04/06/15 pt reports was using 2L at night with CPAP but has stopped using CPAP  . OSA on CPAP   . 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 Pneumonia    "I've had it  several times"  . PONV (postoperative nausea and vomiting)    one episode 1993 vomiting after surgery  . Poor venous access    PT STATES HER PORT A CATH NOT TO BE USED FOR SURGERY/ IV'S  . Positive TB test    "ran deep in my father's family; went on INH for awhile; it caused migraines"  . Radiation adverse effect    "100% blockage on left; 50% on right salivary; can drain w/massage"  . Rash    PT HAS ADULT ACNEA FLAREUPS - RED RASH NOW ON RT FACE   . Restless leg syndrome, controlled 02/19/12   "much better since being on CPAP; been on that ~ 1 month now"  . Shingles   . Shortness of breath dyspnea    with exertion  . Sleep apnea   . Spinal headache   . Stress incontinence in female   . Upper GI bleeding 1970-1980   "had 4"    Patient Active Problem List     Diagnosis Date Noted  . Acute encephalopathy 04/19/2016  . Hypokalemia 04/19/2016  . Confusion 04/18/2016  . Nephrolithiasis 09/29/2015  . Portacath in place 09/07/2015  . Herpes zoster 09/07/2015  . Spinal cord stimulator dysfunction ([place]) 04/09/2015  . History of left breast cancer 09/25/2014  . Pyelonephritis 05/17/2014  . Acute pyelonephritis 05/17/2014  . Abdominal pain, left lower quadrant 05/17/2014  . Nausea & vomiting 10/13/2012  . Iron deficiency anemia 06/18/2012  . DCIS (ductal carcinoma in situ) of breast 03/22/2012  . Abdominal pain 02/19/2012  . Addison's disease ([place])   . Hypertension   . Thyroid disease   . Shingles   . OBESITY 05/20/2008    Past Surgical History:  Procedure Laterality Date  . ABDOMINAL HYSTERECTOMY  1987  . APPENDECTOMY  1960's  . BACK SURGERY  ~ 2004   "chipped disk out to put in permanent stimulator"  . BAND HEMORRHOIDECTOMY  1979  . BILATERAL OOPHORECTOMY  1988  . BREAST LUMPECTOMY  07/13/1992   left  . CHOLECYSTECTOMY  1980's  . LAPAROSCOPY    . MASTECTOMY  2010   left  . MASTECTOMY Right 10/15  . SALIVARY GLAND SURGERY  ~ 2002   "left was 100% blocked twice; right 50%; only had one surgery"  . SALIVARY GLAND SURGERY LEFT    . SPINAL CORD STIMULATOR INSERTION  ~ 2004  . TONSILLECTOMY AND ADENOIDECTOMY  1964    OB History    No data available       Home Medications    Prior to Admission medications   Medication Sig Start Date End Date Taking? Authorizing Provider  Calcium-Vitamin D-Vitamin K (VIACTIV) [phone] MG-UNT-MCG CHEW Chew 3 each by mouth daily at 12 noon.    Yes [provider]  dexlansoprazole (DEXILANT) 60 MG capsule Take 60 mg by mouth daily.   Yes [provider]  diphenhydrAMINE (BENADRYL) 25 MG tablet Take 50 mg by mouth every 6 (six) hours as needed for itching or allergies.   Yes [provider]  fluconazole (DIFLUCAN) 40 MG/ML suspension Take 5 mLs at bedtime by mouth.  10/03/17  Yes [provider]  hydrOXYzine (ATARAX/VISTARIL) 25 MG tablet Take 50 mg every 8 (eight) hours by mouth.   Yes [provider]  levothyroxine (SYNTHROID, LEVOTHROID) 50 MCG tablet Take 50 mcg by mouth every other day. Alternate with the 75mg  tablet always take the 60mcg on even days per spouse   Yes [provider]  losartan (COZAAR)  100 MG tablet Take 100 mg daily by mouth. 08/03/17  Yes [provider]  morphine (MSIR) 30 MG tablet Take 30 mg every 8 (eight) hours by mouth. TAKES AT 1 AM AND 1 PM   Yes [provider]  predniSONE (DELTASONE) 1 MG tablet Take 4 mg by mouth daily with breakfast.   Yes [provider]  senna (SENOKOT) 8.6 MG tablet Take 2-3 tablets daily as needed by mouth for constipation.    Yes [provider]  sucralfate (CARAFATE) 1 GM/10ML suspension Take 1 g daily as needed by mouth (nausea).   Yes [provider]  tiZANidine (ZANAFLEX) 2 MG tablet Take 1-2 tablets 3 (three) times daily as needed by mouth for pain. 10/08/17  Yes [provider]  valACYclovir (VALTREX) 1000 MG tablet Take 1,000 mg 3 (three) times daily as needed by mouth (shingles).    Yes [provider]  levothyroxine (SYNTHROID, LEVOTHROID) 75 MCG tablet Take 75 mcg by mouth every other day.     [provider]  nystatin (MYCOSTATIN/NYSTOP) 100000 UNIT/GM POWD Apply 1 g topically 2 (two) times daily. Patient not taking: Reported on 10/12/2017 09/25/14   Jackolyn Confer, MD  ondansetron (ZOFRAN ODT) 4 MG disintegrating tablet Take 1 tablet (4 mg total) by mouth every 6 (six) hours as needed for nausea or vomiting. Patient not taking: Reported on 10/12/2017 04/20/14   Jovita Kussmaul, MD    Family History Family History  Problem Relation Age of Onset  . Lung cancer Paternal Uncle        smoker  . Breast cancer Cousin 46       maternal cousin  . Throat cancer Cousin        maternal cousin     Social History Social History   Tobacco Use  . Smoking status: Never Smoker  . Smokeless tobacco: Never Used  Substance Use Topics  . Alcohol use: No  . Drug use: No     Allergies   Fosamax [alendronate]; Gabapentin; Iodine-131; Iohexol; Ivp dye [iodinated diagnostic agents]; Sulfa antibiotics; Terbinafine hcl; Ambien [zolpidem]; Bactrim [sulfamethoxazole-trimethoprim]; Compazine; Dilaudid [hydromorphone hcl]; Guaifenesin; Hydromorphone; Lisinopril; Metformin and related; Methocarbamol; Metoclopramide; Metoclopramide hcl; Promethazine; Vancomycin; Aldara [imiquimod]; Amlodipine; Clindamycin/lincomycin; Trifluoperazine; Augmentin [amoxicillin-pot clavulanate]; Biaxin [clarithromycin]; Carafate [sucralfate]; Guaifenesin; Other; and Prochlorperazine   Review of Systems Review of Systems  Constitutional: Positive for fatigue.  Musculoskeletal: Positive for back pain and gait problem.  All other systems reviewed and are negative.    Physical Exam Updated Vital Signs BP (!) 172/88 (BP Location: Right Arm)   Pulse 94   Temp 99.6 F (37.6 C) (Oral)   Resp 17   Ht 5\' 1"  (1.549 m)   Wt 62.6 kg (138 lb)   SpO2 96%   BMI 26.07 kg/m   Physical Exam  Constitutional: She is oriented to person, place, and time. She appears well-developed and well-nourished.  Chronically ill appearing, crying in pain  HENT:  Head: Normocephalic and atraumatic.  Eyes: Conjunctivae are normal.  Neck: Neck supple.  Cardiovascular: Normal rate, regular rhythm and normal heart sounds. Exam reveals no friction rub.  No murmur heard. Pulmonary/Chest: Effort normal and breath sounds normal. No respiratory distress. She has no wheezes. She has no rales.  Abdominal: Soft. She exhibits no distension. There is tenderness (bilateral lower quadrants).  Musculoskeletal: She exhibits no edema.  Neurological: She is alert and oriented to person, place, and time. She exhibits normal muscle tone.  Skin: Skin is  warm. Capillary  refill takes less than 2 seconds.  Psychiatric: She has a normal mood and affect.  Nursing note and vitals reviewed.   Spine Exam: Inspection/Palpation: Exquisite TTp throughout L spine. No deformity. No open wounds. Strength: 4/5 throughout LE bilaterally (hip flexion/extension, adduction/abduction; knee flexion/extension; foot dorsiflexion/plantarflexion, inversion/eversion; great toe inversion), but severely limited by pain Sensation: Intact to light touch in proximal and distal LE bilaterally Reflexes: 2+ quadriceps and achilles reflexes   ED Treatments / Results  Labs (all labs ordered are listed, but only abnormal results are displayed) Labs Reviewed  COMPREHENSIVE METABOLIC PANEL - Abnormal; Notable for the following components:      Result Value   Chloride 99 (*)    Glucose, Bld 116 (*)    Total Bilirubin 0.2 (*)    All other components within normal limits  CBC WITH DIFFERENTIAL/PLATELET  URINALYSIS, ROUTINE W REFLEX MICROSCOPIC  LIPASE, BLOOD  I-STAT CG4 LACTIC ACID, ED    EKG  EKG Interpretation None       Radiology Ct Abdomen Pelvis Wo Contrast  Result Date: 10/12/2017 CLINICAL DATA:  Abdominal pain. Nausea and vomiting. History of breast cancer. EXAM: CT ABDOMEN AND PELVIS WITHOUT CONTRAST TECHNIQUE: Multidetector CT imaging of the abdomen and pelvis was performed following the standard protocol without IV contrast. COMPARISON:  CT chest 03/22/2015. FINDINGS: Lower chest: Bronchiectasis identified in the right middle lobe. Calcified granuloma in the posterior right lower lobe. Hepatobiliary: Previous cholecystectomy. The common bile duct measures 1.6 cm in diameter. No focal liver abnormality. Pancreas: The pancreas is unremarkable. Spleen: Calcified granulomas within the spleen. Adrenals/Urinary Tract: The adrenal glands are normal. Bilateral nephrolithiasis. Stones measure up to 4 mm. No kidney mass or hydronephrosis identified. Urinary bladder  appears normal. Stomach/Bowel: Non distended stomach. No dilated loops of small bowel. A moderate stool burden identified within the colon. Vascular/Lymphatic: Mild aortic atherosclerosis. No aneurysm. No adenopathy within the abdomen or pelvis. Reproductive: Status post hysterectomy. No adnexal masses. Other: No abdominal wall hernia or abnormality. No abdominopelvic ascites. Musculoskeletal: Spinal cord stimulator is identified within the soft tissues overlying the posterior right side of the pelvis. There is a scoliosis deformity involving the lumbar spine. Degenerative disc disease noted. Most advanced at L4-5. There is a compression fracture involving the L1 vertebra, unchanged from 03/22/2015. IMPRESSION: 1. No acute findings identified. 2. Bilateral nonobstructing renal calculi. 3. Mild Aortic Atherosclerosis (ICD10-I70.0). 4. Chronic L1 compression fracture. 5. Scoliosis and degenerative disc disease 6. Previous cholecystectomy with increase caliber of the common bile duct measuring 1.6 cm. Electronically Signed   By: Kerby Moors M.D.   On: 10/12/2017 12:21    Procedures Procedures (including critical care time)  Medications Ordered in ED Medications  morphine 4 MG/ML injection 6 mg (6 mg Intravenous Given 10/12/17 1230)  ondansetron (ZOFRAN) injection 4 mg (4 mg Intravenous Given 10/12/17 1228)  sodium chloride 0.9 % bolus 1,000 mL (0 mLs Intravenous Stopped 10/12/17 1443)  morphine 4 MG/ML injection 6 mg (6 mg Intravenous Given 10/12/17 1354)  methocarbamol (ROBAXIN) injection 500 mg (500 mg Intramuscular Given 10/12/17 1435)  diazepam (VALIUM) tablet 5 mg (5 mg Oral Given 10/12/17 1433)  morphine 4 MG/ML injection 6 mg (6 mg Intravenous Given 10/12/17 1631)     Initial Impression / Assessment and Plan / ED Course  I have reviewed the triage vital signs and the nursing notes.  Pertinent labs & imaging results that were available during my care of the patient were reviewed by me and  considered in my  medical decision making (see chart for details).     66 year old female with past medical history as above here with worsening lower back pain in the setting of recent esophageal manometry.  Imaging and lab work is overall unremarkable.  However, the patient has persistent, severe pain despite both benzodiazepines as well as opiates for control of spasm and pain.  She is unable to even sit up in bed, lives with her elderly husband, and cannot care for herself.  Will discuss with neurosurgery to see if patient is potential candidate for repeat MRI and kyphoplasty.  Suspect she will need admission.  Discussed with Dr. Trenton Gammon of [facility] - agrees with sx control overnight/weekend with Hospitalist service. If pt has persistent severe pain by Monday, contact Dr. Ellene Route for eval in hospital and recommendations re: further imaging/possibe kypho.  Final Clinical Impressions(s) / ED Diagnoses   Final diagnoses:  Acute bilateral low back pain without sciatica    ED Discharge Orders    None       Duffy Bruce, MD 10/12/17 1715

## 2017-10-12 NOTE — ED Notes (Signed)
Patient transported to CT 

## 2017-10-12 NOTE — ED Notes (Signed)
ED Provider at bedside. 

## 2017-10-12 NOTE — ED Notes (Addendum)
450 ml of Urine in and out obtained

## 2017-10-12 NOTE — H&P (Addendum)
History and Physical    Daisy [name] EKC:[id number] DOB: Jun 26, 1951 DOA: 10/12/2017  PCP: Daisy Cruel, MD  Patient coming from: home  I have personally briefly reviewed patient's old medical records in [place]  Chief Complaint: back pain  HPI: Daisy [name] is a 66 y.o. female with medical history significant of MS, addisons disease, chronic back pain with L1 compression fracture with nerve stimulator, history of breast cancer s/p radiation/chemo/surgery, and multiple other medical problems presents with worsening back pain over the past 9 days.   The patient notes that since the manometry last week she's had worsening pain. She notes that during the manometry they had her in several different positions which she takes aggravated her pain. Since then she's had pain to the extent that it's difficult to walk or sit up and movement is excruciating. She notes that she hasn't been up since yesterday due to the pain. She describes the pain as being sharp and burning. It radiates down both legs. The pain is worse on the right side. She notes numbness in her feet and legs is more chronic. She denies any incontinence of stool. She does note that she has a hard time emptying her bladder. This is a chronic problem, but it seems a little worse.  She notes sensitivity to touch. She first describes saddle anesthesia, but on exam she did not have any. She follows with Daisy [name] from neurosurgery.  ED Course: In the emergency department she had labs and imaging. Pain control was attempted with intravenous morphine and Robaxin, but her pain was not controlled. After discussion with neurosurgery plan for admission and observation and attempting pain control.  Review of Systems: As per HPI otherwise 10 point review of systems negative.   Past Medical History:  Diagnosis Date  . Addison's disease ([place])   . Adenocarcinoma of breast ([place]) 07/13/1992   LEFT LUMPECTOMY AND CHEMO AND RADIATION;    NOW HAS RIGHT BREAST MASS 09/25/14 - PLANS MASTECTOMY   . Adrenal insufficiency ([place])    "w/secondary Addison's"  . Anemia   . Anxiety   . Arthritis   . Asthma   . Brittle bone disease   . Bruises easily   . Chronic bronchitis   . Chronic pain    DR. MARK PHILLIPS WITH PAIN CLINIC -  CHRONIC BACK PAIN AND SCOLIOSIS AND PREVIOUS BACK SURGERY AND NOW LUMBAR FRACTURE - PT HAS [employer] SPINAL STIMULATOR WHICH SHE CAN TURN ON AND OFF ; PT ON MORPINE AND CLONAZEPAM AND ROBAXIN  . COPD (chronic obstructive pulmonary disease) ([place])   . Falls frequently   . Fibromyalgia   . Fracture    LUMBAR 1 - FROM A FALL JUNE 2015 - PT HAS A LOT OF SEVERE BACK PAIN AND DIFFICULTY WALKING  . GERD (gastroesophageal reflux disease)   . H/O esophageal spasm    "I can have as many as 32 TUMS before they stop"  . H/O hiatal hernia   . Headache(784.0)    "when I don't eat" (02/19/12)  . 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 History of urinary urgency    [facility] HELPS - DOES TURN URINE ORANGE; DR. Risa [name] IS PT'S UROLOGIST  . Hypertension   . Hypothyroidism   . Kidney stones    previous lithotripsy rt 30 + yrs ago - has bilateral stones now.  . Migraines    "much better now" (02/19/12)  . Multiple sclerosis (  [place]) 05/1988   "dr's in [place] don't think I have it; dr's in [place] still do" (02/19/12);  VERITGO, FALLS, DIFFICULTY WALKING, URINARY URGENCY, BLURRED VISION--DR. CARMEN DOHMEIER NEUROLOGIST- HAS NOT TOLERATED USUAL MEDS TO TREAT MS AND NOT ON ANY MS MEDS AT PRESENT  . On home oxygen therapy    04/06/15 pt reports was using 2L at night with CPAP but has stopped using CPAP  . OSA on CPAP   . 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 Pneumonia    "I've had it several times"  . PONV (postoperative nausea and vomiting)    one episode 1993 vomiting after surgery  . Poor venous access    PT STATES HER PORT A CATH NOT TO BE USED FOR SURGERY/ IV'S  .  Positive TB test    "ran deep in my father's family; went on INH for awhile; it caused migraines"  . Radiation adverse effect    "100% blockage on left; 50% on right salivary; can drain w/massage"  . Rash    PT HAS ADULT ACNEA FLAREUPS - RED RASH NOW ON RT FACE   . Restless leg syndrome, controlled 02/19/12   "much better since being on CPAP; been on that ~ 1 month now"  . Shingles   . Shortness of breath dyspnea    with exertion  . Sleep apnea   . Spinal headache   . Stress incontinence in female   . Upper GI bleeding 1970-1980   "had 4"    Past Surgical History:  Procedure Laterality Date  . ABDOMINAL HYSTERECTOMY  1987  . APPENDECTOMY  1960's  . BACK SURGERY  ~ 2004   "chipped disk out to put in permanent stimulator"  . BAND HEMORRHOIDECTOMY  1979  . BILATERAL OOPHORECTOMY  1988  . BREAST LUMPECTOMY  07/13/1992   left  . CHOLECYSTECTOMY  1980's  . LAPAROSCOPY    . MASTECTOMY  2010   left  . MASTECTOMY Right 10/15  . SALIVARY GLAND SURGERY  ~ 2002   "left was 100% blocked twice; right 50%; only had one surgery"  . SALIVARY GLAND SURGERY LEFT    . SPINAL CORD STIMULATOR INSERTION  ~ 2004  . [place]     reports that  has never smoked. she has never used smokeless tobacco. She reports that she does not drink alcohol or use drugs.  Allergies  Allergen Reactions  . Fosamax [Alendronate] Other (See Comments)    ESOPHAGEAL IRRITATION DANGER OF ESOPHAGEAL PERF.  . Gabapentin Anaphylaxis  . Iodine-131 Anaphylaxis  . Iohexol Anaphylaxis    ANAPHYLACTIC REACTION!-kmr-10/16/05   . Ivp Dye [Iodinated Diagnostic Agents] Anaphylaxis and Other (See Comments)    Fever and blood clot  . Sulfa Antibiotics Swelling    SWELLING REACTION UNSPECIFIED  HALLUCINATIONS   . Terbinafine Hcl Anaphylaxis and Other (See Comments)    THROAT CLOSED UP  . Ambien [Zolpidem] Nausea And Vomiting and Other (See Comments)    SHAKEY  . Bactrim  [Sulfamethoxazole-Trimethoprim] Other (See Comments)    ALTERED MENTAL STATUS HALLUCINATIONS   . Compazine Other (See Comments)    Hyper and shaky  . Dilaudid [Hydromorphone Hcl] Nausea And Vomiting and Other (See Comments)    HEADACHES  . Guaifenesin Nausea And Vomiting, Swelling and Other (See Comments)    SWELLING REACTION UNSPECIFIED  HYPERACTIVE  . Hydromorphone Nausea And Vomiting and Other (See Comments)    HEADACHE >> MIGRAINE  . Lisinopril Cough  .  Metformin And Related     Migraine   . Methocarbamol Other (See Comments)    "Makes pt feel irritable "  . Metoclopramide Other (See Comments)    HYPERACTIVITY   . Metoclopramide Hcl Other (See Comments)    Hyper, shaky  . Promethazine Anxiety and Other (See Comments)    DIZZINESS  . Vancomycin Swelling    SWELLING REACTION UNSPECIFIED   . Aldara [Imiquimod] Other (See Comments)    "Active and sick "  . Amlodipine Swelling    SWELLING REACTION UNSPECIFIED   . Clindamycin/Lincomycin Other (See Comments)    UNSPECIFIED REACTION   . Trifluoperazine     Hallucination   . Augmentin [Amoxicillin-Pot Clavulanate] Diarrhea and Other (See Comments)    Bad headaches Has patient had a PCN reaction causing immediate rash, facial/tongue/throat swelling, SOB or lightheadedness with hypotension: no Has patient had a PCN reaction causing severe rash involving mucus membranes or skin necrosis: no Has patient had a PCN reaction that required hospitalization no Has patient had a PCN reaction occurring within the last 10 years: no If all of the above answers are "NO", then may proceed with Cephalosporin use.   . Biaxin [Clarithromycin] Anxiety and Other (See Comments)    jittery  . Carafate [Sucralfate] Nausea Only    Distends stomach.   . Guaifenesin Anxiety and Other (See Comments)    Headache  . Other Hives    SOME TAPES CAUSE SKIN IRRITATION  . Prochlorperazine Anxiety    Family History  Problem Relation Age of Onset  .  Lung cancer Paternal Uncle        smoker  . Breast cancer Cousin 58       maternal cousin  . Throat cancer Cousin        maternal cousin    Prior to Admission medications   Medication Sig Start Date End Date Taking? Authorizing Provider  Calcium-Vitamin D-Vitamin K (VIACTIV) [phone] MG-UNT-MCG CHEW Chew 3 each by mouth daily at 12 noon.    Yes [provider]  dexlansoprazole (DEXILANT) 60 MG capsule Take 60 mg by mouth daily.   Yes [provider]  diphenhydrAMINE (BENADRYL) 25 MG tablet Take 50 mg by mouth every 6 (six) hours as needed for itching or allergies.   Yes [provider]  fluconazole (DIFLUCAN) 40 MG/ML suspension Take 5 mLs at bedtime by mouth. 10/03/17  Yes [provider]  hydrOXYzine (ATARAX/VISTARIL) 25 MG tablet Take 50 mg every 8 (eight) hours by mouth.   Yes [provider]  levothyroxine (SYNTHROID, LEVOTHROID) 50 MCG tablet Take 50 mcg by mouth every other day. Alternate with the 75mg  tablet always take the 36mcg on even days per spouse   Yes [provider]  losartan (COZAAR) 100 MG tablet Take 100 mg daily by mouth. 08/03/17  Yes [provider]  morphine (MSIR) 30 MG tablet Take 30 mg every 8 (eight) hours by mouth. TAKES AT 1 AM AND 1 PM   Yes [provider]  predniSONE (DELTASONE) 1 MG tablet Take 4 mg by mouth daily with breakfast.   Yes [provider]  ranitidine (ZANTAC) 150 MG tablet Take 150 mg 2 (two) times daily by mouth.   Yes [provider]  senna (SENOKOT) 8.6 MG tablet Take 2-3 tablets daily as needed by mouth for constipation.    Yes [provider]  sucralfate (CARAFATE) 1 GM/10ML suspension Take 1 g daily as needed by mouth (nausea).   Yes [provider]  tiZANidine (ZANAFLEX) 2 MG tablet Take 1-2 tablets 3 (three) times daily as needed by mouth for pain. 10/08/17  Yes [provider]  valACYclovir (VALTREX) 1000 MG tablet Take  1,000 mg 3 (three) times daily as needed by mouth (shingles).    Yes [provider]  levothyroxine (SYNTHROID, LEVOTHROID) 75 MCG tablet Take 75 mcg by mouth every other day.     [provider]  nystatin (MYCOSTATIN/NYSTOP) 100000 UNIT/GM POWD Apply 1 g topically 2 (two) times daily. Patient not taking: Reported on 10/12/2017 09/25/14   Jackolyn Confer, MD  ondansetron (ZOFRAN ODT) 4 MG disintegrating tablet Take 1 tablet (4 mg total) by mouth every 6 (six) hours as needed for nausea or vomiting. Patient not taking: Reported on 10/12/2017 04/20/14   Jovita Kussmaul, MD    Physical Exam: Vitals:   10/12/17 1530 10/12/17 1630 10/12/17 1730 10/12/17 1830  BP: (!) 173/83 (!) 172/88 (!) 168/88 (!) 169/89  Pulse: 94 94 91 91  Resp: 17 17 16 16   Temp:      TempSrc:      SpO2: 97% 96% 99% 93%  Weight:      Height:        Constitutional: NAD, calm, comfortable Vitals:   10/12/17 1530 10/12/17 1630 10/12/17 1730 10/12/17 1830  BP: (!) 173/83 (!) 172/88 (!) 168/88 (!) 169/89  Pulse: 94 94 91 91  Resp: 17 17 16 16   Temp:      TempSrc:      SpO2: 97% 96% 99% 93%  Weight:      Height:       Eyes: PERRL, lids and conjunctivae normal ENMT: Mucous membranes are moist. Posterior pharynx clear of any exudate or lesions.Normal dentition.  Neck: normal, supple, no masses, no thyromegaly Respiratory: clear to auscultation bilaterally, no wheezing, no crackles. Normal respiratory effort. No accessory muscle use.  Cardiovascular: Regular rate and rhythm, no murmurs / rubs / gallops. No extremity edema. 2+ pedal pulses. No carotid bruits.  Abdomen: Diffuse tenderness to palpation. No hepatosplenomegaly. Bowel sounds positive.  Musculoskeletal: no clubbing / cyanosis. No joint deformity upper and lower extremities. Good ROM, no contractures. Normal muscle tone.  TTP to back, mostly paraspinal on R side.  No significant midline tenderness (difficult exam as she only rolled partially to  the L) Skin: no rashes, lesions, ulcers. No induration Neurologic: CN 2-12 grossly intact. Sensation intact.  No saddle anesthesia. Strength 5/5 in all 4.  Psychiatric: Normal judgment and insight. Alert and oriented x 3. Normal mood.   Labs on Admission: I have personally reviewed following labs and imaging studies  CBC: Recent Labs  Lab 10/12/17 1217  WBC 7.9  NEUTROABS 5.8  HGB 13.3  HCT 39.4  MCV 82.3  PLT 409   Basic Metabolic Panel: Recent Labs  Lab 10/12/17 1217  NA 135  K 4.9  CL 99*  CO2 26  GLUCOSE 116*  BUN 19  CREATININE 0.67  CALCIUM 9.1   GFR: Estimated Creatinine Clearance: 59.4 mL/min (by C-G formula based on SCr of 0.67 mg/dL). Liver Function Tests: Recent Labs  Lab 10/12/17 1217  AST 21  ALT 14  ALKPHOS 84  BILITOT 0.2*  PROT 7.4  ALBUMIN 3.8   Recent Labs  Lab 10/12/17 1217  LIPASE 17   No results for input(s): AMMONIA in the last 168 hours. Coagulation Profile: No results for input(s): INR, PROTIME in the last 168 hours. Cardiac Enzymes: No results for input(s): CKTOTAL, CKMB, CKMBINDEX,  TROPONINI in the last 168 hours. BNP (last 3 results) No results for input(s): PROBNP in the last 8760 hours. HbA1C: No results for input(s): HGBA1C in the last 72 hours. CBG: No results for input(s): GLUCAP in the last 168 hours. Lipid Profile: No results for input(s): CHOL, HDL, LDLCALC, TRIG, CHOLHDL, LDLDIRECT in the last 72 hours. Thyroid Function Tests: No results for input(s): TSH, T4TOTAL, FREET4, T3FREE, THYROIDAB in the last 72 hours. Anemia Panel: No results for input(s): VITAMINB12, FOLATE, FERRITIN, TIBC, IRON, RETICCTPCT in the last 72 hours. Urine analysis:    Component Value Date/Time   COLORURINE YELLOW 10/12/2017 1450   APPEARANCEUR CLEAR 10/12/2017 1450   LABSPEC 1.016 10/12/2017 1450   PHURINE 6.0 10/12/2017 1450   GLUCOSEU NEGATIVE 10/12/2017 1450   HGBUR NEGATIVE 10/12/2017 1450   BILIRUBINUR NEGATIVE 10/12/2017 1450     KETONESUR NEGATIVE 10/12/2017 1450   PROTEINUR NEGATIVE 10/12/2017 1450   UROBILINOGEN 1.0 05/17/2014 1753   NITRITE NEGATIVE 10/12/2017 1450   LEUKOCYTESUR NEGATIVE 10/12/2017 1450    Radiological Exams on Admission: Ct Abdomen Pelvis Wo Contrast  Result Date: 10/12/2017 CLINICAL DATA:  Abdominal pain. Nausea and vomiting. History of breast cancer. EXAM: CT ABDOMEN AND PELVIS WITHOUT CONTRAST TECHNIQUE: Multidetector CT imaging of the abdomen and pelvis was performed following the standard protocol without IV contrast. COMPARISON:  CT chest 03/22/2015. FINDINGS: Lower chest: Bronchiectasis identified in the right middle lobe. Calcified granuloma in the posterior right lower lobe. Hepatobiliary: Previous cholecystectomy. The common bile duct measures 1.6 cm in diameter. No focal liver abnormality. Pancreas: The pancreas is unremarkable. Spleen: Calcified granulomas within the spleen. Adrenals/Urinary Tract: The adrenal glands are normal. Bilateral nephrolithiasis. Stones measure up to 4 mm. No kidney mass or hydronephrosis identified. Urinary bladder appears normal. Stomach/Bowel: Non distended stomach. No dilated loops of small bowel. A moderate stool burden identified within the colon. Vascular/Lymphatic: Mild aortic atherosclerosis. No aneurysm. No adenopathy within the abdomen or pelvis. Reproductive: Status post hysterectomy. No adnexal masses. Other: No abdominal wall hernia or abnormality. No abdominopelvic ascites. Musculoskeletal: Spinal cord stimulator is identified within the soft tissues overlying the posterior right side of the pelvis. There is a scoliosis deformity involving the lumbar spine. Degenerative disc disease noted. Most advanced at L4-5. There is a compression fracture involving the L1 vertebra, unchanged from 03/22/2015. IMPRESSION: 1. No acute findings identified. 2. Bilateral nonobstructing renal calculi. 3. Mild Aortic Atherosclerosis (ICD10-I70.0). 4. Chronic L1 compression  fracture. 5. Scoliosis and degenerative disc disease 6. Previous cholecystectomy with increase caliber of the common bile duct measuring 1.6 cm. Electronically Signed   By: Kerby Moors M.D.   On: 10/12/2017 12:21    EKG: Independently reviewed. none  Assessment/Plan Active Problems:   Back pain  Chronic Back Pain with chronic C1 compression fracture with exacerbation  Degenerative disc disease  Abdominal Pain:   Follows with Daisy [name].  Had recent MRI in 05/2017.  Case was discussed with Dr. Annette Stable from neurosurgery who rec admit for pain control, obs, f/u with Daisy [name] Monday, maybe reimaging then.  Pt had some urinary retention, but has had chronic problems with this.  Difficult to tell if this worsening is due to severe pain, but this may be the case.  No saddle anesthesia and equal strength to bilateral LE's.  She's got tenderness of her abdomen as well, but this seems to be referred pain with CT without acute findings. Will attempt pain control with IV morphine, robaxin, toradol, scheduled tylenol, lidocaine patch.  She's  opioid tolerant and did not get much relief with morphine 6 mg in ED (takes MSIR 30 TID at home) Hydroxyzine for nausea (notes this is all that works) PT/OT when tolerated Continue to monitor for pain control, if still her Monday, will have Daisy [name] see her    Urinary retention: UA not suggestive of UTI.  Unclear if due to pain, but some chronicity to this, but worse since above.  CTM. Hypothyroidism: continue synthroid Candidal esophagitis: continue fluconazole (about 7 days into 2 week course) GERD: continue PPI and H2 blocker Addisons: continue prednisone MS: not on any medications for this History of breast cancer: s/p chemo/rad/surgery Soft diet for esophageal dysmotility  DVT prophylaxis: lovenox  Code Status: full  Family Communication: husband at bedside  Disposition Plan: pending improvement Consults called: neurosurgery  Admission status: obs     Fayrene Helper MD [facility] [phone]  If 7PM-7AM, please contact night-coverage [email or web address] Password [facility]  10/12/2017, 7:32 PM

## 2017-10-13 DIAGNOSIS — M549 Dorsalgia, unspecified: Secondary | ICD-10-CM | POA: Diagnosis not present

## 2017-10-13 DIAGNOSIS — M545 Low back pain: Secondary | ICD-10-CM | POA: Diagnosis not present

## 2017-10-13 LAB — BASIC METABOLIC PANEL
Anion gap: 7 (ref 5–15)
BUN: 15 mg/dL (ref 6–20)
CHLORIDE: 102 mmol/L (ref 101–111)
CO2: 25 mmol/L (ref 22–32)
CREATININE: 0.78 mg/dL (ref 0.44–1.00)
Calcium: 8.3 mg/dL — ABNORMAL LOW (ref 8.9–10.3)
GFR calc Af Amer: >60 mL/min (ref >60)
GFR calc non Af Amer: >60 mL/min (ref >60)
GLUCOSE: 77 mg/dL (ref 65–99)
Potassium: 4.1 mmol/L (ref 3.5–5.1)
Sodium: 134 mmol/L — ABNORMAL LOW (ref 135–145)

## 2017-10-13 LAB — CBC
HCT: 37.3 % (ref 36.0–46.0)
Hemoglobin: 12.3 g/dL (ref 12.0–15.0)
MCH: 27.2 pg (ref 26.0–34.0)
MCHC: 33 g/dL (ref 30.0–36.0)
MCV: 82.3 fL (ref 78.0–100.0)
PLATELETS: 225 10*3/uL (ref 150–400)
RBC: 4.53 MIL/uL (ref 3.87–5.11)
RDW: 13.4 % (ref 11.5–15.5)
WBC: 6.9 10*3/uL (ref 4.0–10.5)

## 2017-10-13 LAB — HIV ANTIBODY (ROUTINE TESTING W REFLEX): HIV Screen 4th Generation wRfx: NONREACTIVE

## 2017-10-13 MED ORDER — KETOROLAC TROMETHAMINE 15 MG/ML IJ SOLN
15.0000 mg | Freq: Four times a day (QID) | INTRAMUSCULAR | Status: DC
  Administered 2017-10-13 – 2017-10-16 (×11): 15 mg via INTRAVENOUS
  Filled 2017-10-13 (×12): qty 1

## 2017-10-13 MED ORDER — MORPHINE SULFATE 15 MG PO TABS
30.0000 mg | ORAL_TABLET | Freq: Three times a day (TID) | ORAL | Status: DC
  Administered 2017-10-13 – 2017-10-18 (×16): 30 mg via ORAL
  Filled 2017-10-13 (×16): qty 2

## 2017-10-13 MED ORDER — MORPHINE SULFATE (PF) 4 MG/ML IV SOLN
8.0000 mg | Freq: Four times a day (QID) | INTRAVENOUS | Status: DC | PRN
  Administered 2017-10-15 (×2): 8 mg via INTRAVENOUS
  Filled 2017-10-13 (×2): qty 2

## 2017-10-13 MED ORDER — ONDANSETRON 4 MG PO TBDP
4.0000 mg | ORAL_TABLET | Freq: Three times a day (TID) | ORAL | Status: DC | PRN
  Administered 2017-10-13 – 2017-10-18 (×9): 4 mg via ORAL
  Filled 2017-10-13 (×10): qty 1

## 2017-10-13 NOTE — Progress Notes (Signed)
PROGRESS NOTE    Daisy [name]  VOJ:[id number] DOB: 08-28-1951 DOA: 10/12/2017 PCP: Daisy Cruel, MD  Brief Narrative:  Daisy [name] is a 66 y.o. female with medical history significant of MS, addisons disease, chronic back pain with L1 compression fracture with nerve stimulator, history of breast cancer s/p radiation/chemo/surgery, and multiple other medical problems presents with worsening back pain over the past 9 days.   The patient notes that since the manometry last week she's had worsening pain. She notes that during the manometry they had her in several different positions which she takes aggravated her pain. Since then she's had pain to the extent that it's difficult to walk or sit up and movement is excruciating. She notes that she hasn't been up since yesterday due to the pain. She describes the pain as being sharp and burning. It radiates down both legs. The pain is worse on the right side. She notes numbness in her feet and legs is more chronic. She denies any incontinence of stool. She does note that she has a hard time emptying her bladder. This is a chronic problem, but it seems a little worse.  She notes sensitivity to touch. She first describes saddle anesthesia, but on exam she did not have any. She follows with Daisy [name] from neurosurgery.  Assessment & Plan:   Active Problems:   Addison's disease ([place])   Thyroid disease   History of left breast cancer   Back pain   Degenerative disc disease, lumbar   Compression fracture of first lumbar vertebra (HCC)   GERD (gastroesophageal reflux disease)   Chronic Back Pain with chronic C1 compression fracture with exacerbation  Degenerative disc disease  Abdominal Pain: Discussed goals to control pain and also try to mobilize her (was under impression we wanted to keep her on bedrest).  Apparently had some relief with toradol last night, so we'll schedule this.  Resume her MSIR.     Follows with Daisy [name].  Had  recent MRI in 05/2017.  Case was discussed with Daisy [name] from neurosurgery who rec admit for pain control, obs, f/u with Daisy [name] Monday, maybe reimaging then.  Pt had some urinary retention, but has had chronic problems with this.  Difficult to tell if this worsening is due to severe pain, but this may be the case.  No saddle anesthesia and equal strength to bilateral LE's.  She's got tenderness of her abdomen as well, but this seems to be referred pain with CT without acute findings. Will attempt pain control with home morphine, scheduled toradol, scheduled APAP, liodcaine patch, prn robaxin, prn IV morphine.  Allergy to gabapentin.  Could consider other nonopiate meds like lyrica or cymbalta, but she notes wanting to try to limit meds.  Will readdress tomorrow.  She's opioid tolerant and did not get much relief with morphine 6 mg in ED (takes MSIR 30 TID at home) Hydroxyzine for nausea (notes this is all that works). Zofran as well. PT/OT when tolerated Continue to monitor for pain control, if still her Monday, will have Daisy [name] see her    Urinary retention: UA not suggestive of UTI.  Unclear if due to pain, but some chronicity to this, but worse since above.  CTM.  Possibly trial without foley when pain control a bit better tomorrow.  Hypothyroidism: continue synthroid Candidal esophagitis: continue fluconazole (about 7 days into 2 week course) GERD: continue PPI and H2 blocker Addisons: continue prednisone MS: not on any medications for this  History of breast cancer: s/p chemo/rad/surgery Soft diet for esophageal dysmotility  DVT prophylaxis: lovenox Code Status: full  Family Communication: none at bedside Disposition Plan: pending improvement   Consultants:   Neurosurgery  Procedures: (Don't include imaging studies which can be auto populated. Include things that cannot be auto populated i.e. Echo, Carotid and venous dopplers, Foley, Bipap, HD, tubes/drains, wound vac, central  lines etc)  none  Antimicrobials: (specify start and planned stop date. Auto populated tables are space occupying and do not give end dates) Anti-infectives (From admission, onward)   Start     Dose/Rate [name] Frequency Ordered Stop   10/12/17 2315  fluconazole (DIFLUCAN) 40 MG/ML suspension 200 mg     200 mg Oral Daily at bedtime 10/12/17 2302     10/12/17 2200  fluconazole (DIFLUCAN) 40 MG/ML suspension 200 mg  Status:  Discontinued     200 mg Oral Daily at bedtime 10/12/17 2016 10/12/17 2018   10/12/17 2200  fluconazole (DIFLUCAN) tablet 200 mg  Status:  Discontinued     200 mg Oral Daily at bedtime 10/12/17 2018 10/12/17 2302           Subjective: Not much pain relief.  Thought she was supposed to be on bedrest.  Things are about the same.   Objective: Vitals:   10/12/17 2012 10/12/17 2139 10/13/17 0516 10/13/17 1457  BP: (!) 161/81 (!) 148/78 (!) 167/79 (!) 176/81  Pulse: (!) 103 96 78 80  Resp: 18 20 18 18   Temp: 98.7 F (37.1 C) 98 F (36.7 C) 97.7 F (36.5 C) (!) 97.3 F (36.3 C)  TempSrc: Oral Oral Axillary Oral  SpO2: 99% 98% 96% 96%  Weight:      Height:        Intake/Output Summary (Last 24 hours) at 10/13/2017 1715 Last data filed at 10/13/2017 1300 Gross per 24 hour  Intake 415 ml  Output 500 ml  Net -85 ml   Filed Weights   10/12/17 1056  Weight: 62.6 kg (138 lb)    Examination:  General exam: Appears calm and comfortable  Respiratory system: Clear to auscultation. Respiratory effort normal. Cardiovascular system: S1 & S2 heard, RRR. No JVD, murmurs, rubs, gallops or clicks. No pedal edema. Gastrointestinal system: Abdomen is TTP most on RLQ, soft and. No organomegaly or masses felt. Normal bowel sounds heard. Central nervous system: Alert and oriented. No focal neurological deficits.  No saddle anesthesia. Extremities: 5/5 strength to LE bilaterally Skin: No rashes, lesions or ulcers Psychiatry: Judgement and insight appear normal. Mood &  affect appropriate.   Data Reviewed: I have personally reviewed following labs and imaging studies  CBC: Recent Labs  Lab 10/12/17 1217 10/13/17 0615  WBC 7.9 6.9  NEUTROABS 5.8  --   HGB 13.3 12.3  HCT 39.4 37.3  MCV 82.3 82.3  PLT 246 762   Basic Metabolic Panel: Recent Labs  Lab 10/12/17 1217 10/13/17 0615  NA 135 134*  K 4.9 4.1  CL 99* 102  CO2 26 25  GLUCOSE 116* 77  BUN 19 15  CREATININE 0.67 0.78  CALCIUM 9.1 8.3*   GFR: Estimated Creatinine Clearance: 59.4 mL/min (by C-G formula based on SCr of 0.78 mg/dL). Liver Function Tests: Recent Labs  Lab 10/12/17 1217  AST 21  ALT 14  ALKPHOS 84  BILITOT 0.2*  PROT 7.4  ALBUMIN 3.8   Recent Labs  Lab 10/12/17 1217  LIPASE 17   No results for input(s): AMMONIA in the last 168  hours. Coagulation Profile: No results for input(s): INR, PROTIME in the last 168 hours. Cardiac Enzymes: No results for input(s): CKTOTAL, CKMB, CKMBINDEX, TROPONINI in the last 168 hours. BNP (last 3 results) No results for input(s): PROBNP in the last 8760 hours. HbA1C: No results for input(s): HGBA1C in the last 72 hours. CBG: No results for input(s): GLUCAP in the last 168 hours. Lipid Profile: No results for input(s): CHOL, HDL, LDLCALC, TRIG, CHOLHDL, LDLDIRECT in the last 72 hours. Thyroid Function Tests: No results for input(s): TSH, T4TOTAL, FREET4, T3FREE, THYROIDAB in the last 72 hours. Anemia Panel: No results for input(s): VITAMINB12, FOLATE, FERRITIN, TIBC, IRON, RETICCTPCT in the last 72 hours. Sepsis Labs: Recent Labs  Lab 10/12/17 1230  LATICACIDVEN 1.29    No results found for this or any previous visit (from the past 240 hour(s)).       Radiology Studies: Ct Abdomen Pelvis Wo Contrast  Result Date: 10/12/2017 CLINICAL DATA:  Abdominal pain. Nausea and vomiting. History of breast cancer. EXAM: CT ABDOMEN AND PELVIS WITHOUT CONTRAST TECHNIQUE: Multidetector CT imaging of the abdomen and pelvis was  performed following the standard protocol without IV contrast. COMPARISON:  CT chest 03/22/2015. FINDINGS: Lower chest: Bronchiectasis identified in the right middle lobe. Calcified granuloma in the posterior right lower lobe. Hepatobiliary: Previous cholecystectomy. The common bile duct measures 1.6 cm in diameter. No focal liver abnormality. Pancreas: The pancreas is unremarkable. Spleen: Calcified granulomas within the spleen. Adrenals/Urinary Tract: The adrenal glands are normal. Bilateral nephrolithiasis. Stones measure up to 4 mm. No kidney mass or hydronephrosis identified. Urinary bladder appears normal. Stomach/Bowel: Non distended stomach. No dilated loops of small bowel. A moderate stool burden identified within the colon. Vascular/Lymphatic: Mild aortic atherosclerosis. No aneurysm. No adenopathy within the abdomen or pelvis. Reproductive: Status post hysterectomy. No adnexal masses. Other: No abdominal wall hernia or abnormality. No abdominopelvic ascites. Musculoskeletal: Spinal cord stimulator is identified within the soft tissues overlying the posterior right side of the pelvis. There is a scoliosis deformity involving the lumbar spine. Degenerative disc disease noted. Most advanced at L4-5. There is a compression fracture involving the L1 vertebra, unchanged from 03/22/2015. IMPRESSION: 1. No acute findings identified. 2. Bilateral nonobstructing renal calculi. 3. Mild Aortic Atherosclerosis (ICD10-I70.0). 4. Chronic L1 compression fracture. 5. Scoliosis and degenerative disc disease 6. Previous cholecystectomy with increase caliber of the common bile duct measuring 1.6 cm. Electronically Signed   By: Kerby Moors M.D.   On: 10/12/2017 12:21        Scheduled Meds: . acetaminophen  1,000 mg Oral Q8H  . calcium-vitamin D  1 tablet Oral Q breakfast  . enoxaparin (LOVENOX) injection  40 mg Subcutaneous Q24H  . famotidine  20 mg Oral BID  . fluconazole  200 mg Oral QHS  . hydrOXYzine  50  mg Oral Q8H  . ketorolac  15 mg Intravenous Q6H  . levothyroxine  50 mcg Oral QODAY   And  . [START ON 10/14/2017] levothyroxine  75 mcg Oral QODAY  . lidocaine  1 patch Transdermal Q24H  . losartan  100 mg Oral Daily  . morphine  30 mg Oral Q8H  . pantoprazole  40 mg Oral Daily  . predniSONE  4 mg Oral Q breakfast   Continuous Infusions: . methocarbamol (ROBAXIN)  IV Stopped (10/12/17 2338)     LOS: 0 days    Time spent: over 30 minutes    Fayrene Helper, MD [facility] Pager [phone]  If 7PM-7AM, please contact night-coverage [email or web address]  Password [email or web address] 10/13/2017, 5:15 PM

## 2017-10-14 DIAGNOSIS — I10 Essential (primary) hypertension: Secondary | ICD-10-CM | POA: Diagnosis present

## 2017-10-14 DIAGNOSIS — E079 Disorder of thyroid, unspecified: Secondary | ICD-10-CM | POA: Diagnosis not present

## 2017-10-14 DIAGNOSIS — G8929 Other chronic pain: Secondary | ICD-10-CM | POA: Diagnosis present

## 2017-10-14 DIAGNOSIS — M797 Fibromyalgia: Secondary | ICD-10-CM | POA: Diagnosis present

## 2017-10-14 DIAGNOSIS — M4186 Other forms of scoliosis, lumbar region: Secondary | ICD-10-CM | POA: Diagnosis present

## 2017-10-14 DIAGNOSIS — M810 Age-related osteoporosis without current pathological fracture: Secondary | ICD-10-CM | POA: Diagnosis present

## 2017-10-14 DIAGNOSIS — M47816 Spondylosis without myelopathy or radiculopathy, lumbar region: Secondary | ICD-10-CM | POA: Diagnosis present

## 2017-10-14 DIAGNOSIS — M549 Dorsalgia, unspecified: Secondary | ICD-10-CM | POA: Diagnosis not present

## 2017-10-14 DIAGNOSIS — J449 Chronic obstructive pulmonary disease, unspecified: Secondary | ICD-10-CM | POA: Diagnosis present

## 2017-10-14 DIAGNOSIS — E039 Hypothyroidism, unspecified: Secondary | ICD-10-CM | POA: Diagnosis present

## 2017-10-14 DIAGNOSIS — Z853 Personal history of malignant neoplasm of breast: Secondary | ICD-10-CM | POA: Diagnosis not present

## 2017-10-14 DIAGNOSIS — S32019A Unspecified fracture of first lumbar vertebra, initial encounter for closed fracture: Secondary | ICD-10-CM | POA: Diagnosis present

## 2017-10-14 DIAGNOSIS — K210 Gastro-esophageal reflux disease with esophagitis: Secondary | ICD-10-CM | POA: Diagnosis present

## 2017-10-14 DIAGNOSIS — B3781 Candidal esophagitis: Secondary | ICD-10-CM | POA: Diagnosis present

## 2017-10-14 DIAGNOSIS — E271 Primary adrenocortical insufficiency: Secondary | ICD-10-CM | POA: Diagnosis not present

## 2017-10-14 DIAGNOSIS — G35 Multiple sclerosis: Secondary | ICD-10-CM | POA: Diagnosis present

## 2017-10-14 DIAGNOSIS — M48061 Spinal stenosis, lumbar region without neurogenic claudication: Secondary | ICD-10-CM | POA: Diagnosis not present

## 2017-10-14 DIAGNOSIS — R338 Other retention of urine: Secondary | ICD-10-CM | POA: Diagnosis not present

## 2017-10-14 DIAGNOSIS — K5900 Constipation, unspecified: Secondary | ICD-10-CM | POA: Diagnosis present

## 2017-10-14 DIAGNOSIS — S32010A Wedge compression fracture of first lumbar vertebra, initial encounter for closed fracture: Secondary | ICD-10-CM | POA: Diagnosis not present

## 2017-10-14 DIAGNOSIS — N390 Urinary tract infection, site not specified: Secondary | ICD-10-CM | POA: Diagnosis not present

## 2017-10-14 DIAGNOSIS — M545 Low back pain: Secondary | ICD-10-CM | POA: Diagnosis not present

## 2017-10-14 DIAGNOSIS — G4733 Obstructive sleep apnea (adult) (pediatric): Secondary | ICD-10-CM | POA: Diagnosis present

## 2017-10-14 DIAGNOSIS — Q780 Osteogenesis imperfecta: Secondary | ICD-10-CM | POA: Diagnosis not present

## 2017-10-14 DIAGNOSIS — M5136 Other intervertebral disc degeneration, lumbar region: Secondary | ICD-10-CM | POA: Diagnosis present

## 2017-10-14 LAB — BASIC METABOLIC PANEL
Anion gap: 12 (ref 5–15)
BUN: 18 mg/dL (ref 6–20)
CO2: 20 mmol/L — ABNORMAL LOW (ref 22–32)
CREATININE: 0.86 mg/dL (ref 0.44–1.00)
Calcium: 8.3 mg/dL — ABNORMAL LOW (ref 8.9–10.3)
Chloride: 99 mmol/L — ABNORMAL LOW (ref 101–111)
Glucose, Bld: 116 mg/dL — ABNORMAL HIGH (ref 65–99)
Potassium: 4.5 mmol/L (ref 3.5–5.1)
Sodium: 131 mmol/L — ABNORMAL LOW (ref 135–145)

## 2017-10-14 MED ORDER — SODIUM CHLORIDE 0.9 % IV SOLN
INTRAVENOUS | Status: DC
  Administered 2017-10-14: 75 mL/h via INTRAVENOUS
  Administered 2017-10-15: 02:00:00 via INTRAVENOUS

## 2017-10-14 NOTE — Progress Notes (Signed)
PROGRESS NOTE    RADIANCE DEADY  KWI:[id number] DOB: Oct 23, 1951 DOA: 10/12/2017 PCP: Lawerance Cruel, MD  Brief Narrative:  Daisy [name] is Daisy [name] 66 y.o. female with medical history significant of MS, addisons disease, chronic back pain with L1 compression fracture with nerve stimulator, history of breast cancer s/p radiation/chemo/surgery, and multiple other medical problems presents with worsening back pain over the past 9 days.   The patient notes that since the manometry last week she's had worsening pain. She notes that during the manometry they had her in several different positions which she takes aggravated her pain. Since then she's had pain to the extent that it's difficult to walk or sit up and movement is excruciating. She notes that she hasn't been up since yesterday due to the pain. She describes the pain as being sharp and burning. It radiates down both legs. The pain is worse on the right side. She notes numbness in her feet and legs is more chronic. She denies any incontinence of stool. She does note that she has [name] hard time emptying her bladder. This is Daisy [name] chronic problem, but it seems Daisy [name] little worse.  She notes sensitivity to touch. She first describes saddle anesthesia, but on exam she did not have any. She follows with Dr. Ellene Route from neurosurgery.  Assessment & Plan:   Active Problems:   Addison's disease ([place])   Thyroid disease   History of left breast cancer   Back pain   Degenerative disc disease, lumbar   Compression fracture of first lumbar vertebra (HCC)   GERD (gastroesophageal reflux disease)   Chronic Back Pain with chronic C1 compression fracture with exacerbation  Degenerative disc disease  Abdominal Pain: Noted her pain is better this morning.  Has abdominal pain still, but with negative CT abdomen pelvis, think this is referred pain.   Reinforced goals to control pain and also try to mobilize her (was under impression we wanted to keep her on bedrest).   PT to see her today.     Follows with Dr. Ellene Route.  Had recent MRI in 05/2017.  Case was discussed with Dr. Annette Stable from neurosurgery who rec admit for pain control, obs, f/u with Dr. Ellene Route Monday, maybe reimaging then.  Pt had some urinary retention, but has had chronic problems with this.  Difficult to tell if this worsening is due to severe pain, but this may be the case.  No saddle anesthesia and equal strength to bilateral LE's.  She's got tenderness of her abdomen as well, but this seems to be referred pain with CT without acute findings. Will attempt pain control with home morphine, scheduled toradol, scheduled APAP, liodcaine patch, prn robaxin, prn IV morphine.  Allergy to gabapentin.  Could consider other nonopiate meds like lyrica or cymbalta, but she notes wanting to try to limit meds.   She's opioid tolerant and did not get much relief with morphine 6 mg in ED (takes MSIR 30 TID at home) Hydroxyzine for nausea (notes this is all that works). Zofran as well. PT/OT when tolerated Continue to monitor for pain control, if still her Monday, will have Dr. Ellene Route see her    Urinary retention: UA not suggestive of UTI.  Unclear if due to pain, but some chronicity to this, but worse since above.  Trial without foley.   Hypothyroidism: continue synthroid Candidal esophagitis: continue fluconazole (about 7 days into 2 week course) GERD: continue PPI and H2 blocker Addisons: continue prednisone MS: not on any medications  for this History of breast cancer: s/p chemo/rad/surgery Soft diet for esophageal dysmotility  DVT prophylaxis: lovenox Code Status: full  Family Communication: none at bedside Disposition Plan: pending improvement   Consultants:   Neurosurgery  Procedures: (Don't include imaging studies which can be auto populated. Include things that cannot be auto populated i.e. Echo, Carotid and venous dopplers, Foley, Bipap, HD, tubes/drains, wound vac, central lines  etc)  none  Antimicrobials: (specify start and planned stop date. Auto populated tables are space occupying and do not give end dates) Anti-infectives (From admission, onward)   Start     Dose/Rate Route Frequency Ordered Stop   10/12/17 2315  fluconazole (DIFLUCAN) 40 MG/ML suspension 200 mg     200 mg Oral Daily at bedtime 10/12/17 2302     10/12/17 2200  fluconazole (DIFLUCAN) 40 MG/ML suspension 200 mg  Status:  Discontinued     200 mg Oral Daily at bedtime 10/12/17 2016 10/12/17 2018   10/12/17 2200  fluconazole (DIFLUCAN) tablet 200 mg  Status:  Discontinued     200 mg Oral Daily at bedtime 10/12/17 2018 10/12/17 2302          Subjective: Pain is better. Still abdominal pain/cramping.  Some nausea.  Objective: Vitals:   10/13/17 1457 10/13/17 2140 10/14/17 0600 10/14/17 0936  BP: (!) 176/81 (!) 167/86 140/77 123/75  Pulse: 80 69 82 76  Resp: 18 18 19    Temp: (!) 97.3 F (36.3 C) 98.4 F (36.9 C) (!) 97.4 F (36.3 C)   TempSrc: Oral Oral Oral   SpO2: 96% 95% 99%   Weight:      Height:        Intake/Output Summary (Last 24 hours) at 10/14/2017 1011 Last data filed at 10/14/2017 0601 Gross per 24 hour  Intake 240 ml  Output 690 ml  Net -450 ml   Filed Weights   10/12/17 1056  Weight: 62.6 kg (138 lb)    Examination:  General: No acute distress. Cardiovascular: Heart sounds show Zalika Tieszen regular rate, and rhythm. No gallops or rubs. No murmurs. No JVD. Lungs: Clear to auscultation bilaterally with good air movement. No rales, rhonchi or wheezes. Abdomen: Soft, nontender, nondistended with normal active bowel sounds. No masses. No hepatosplenomegaly. Neurological: Alert and oriented 3. 5/5 strength to LE bilaterally. Cranial nerves II through XII grossly intact. Skin: Warm and dry. No rashes or lesions. Extremities: No clubbing or cyanosis. No edema. Pedal pulses 2+. Psychiatric: Mood and affect are normal. Insight and judgment are appropriate.  Data Reviewed:  I have personally reviewed following labs and imaging studies  CBC: Recent Labs  Lab 10/12/17 1217 10/13/17 0615  WBC 7.9 6.9  NEUTROABS 5.8  --   HGB 13.3 12.3  HCT 39.4 37.3  MCV 82.3 82.3  PLT 246 338   Basic Metabolic Panel: Recent Labs  Lab 10/12/17 1217 10/13/17 0615  NA 135 134*  K 4.9 4.1  CL 99* 102  CO2 26 25  GLUCOSE 116* 77  BUN 19 15  CREATININE 0.67 0.78  CALCIUM 9.1 8.3*   GFR: Estimated Creatinine Clearance: 59.4 mL/min (by C-G formula based on SCr of 0.78 mg/dL). Liver Function Tests: Recent Labs  Lab 10/12/17 1217  AST 21  ALT 14  ALKPHOS 84  BILITOT 0.2*  PROT 7.4  ALBUMIN 3.8   Recent Labs  Lab 10/12/17 1217  LIPASE 17   No results for input(s): AMMONIA in the last 168 hours. Coagulation Profile: No results for input(s): INR, PROTIME  in the last 168 hours. Cardiac Enzymes: No results for input(s): CKTOTAL, CKMB, CKMBINDEX, TROPONINI in the last 168 hours. BNP (last 3 results) No results for input(s): PROBNP in the last 8760 hours. HbA1C: No results for input(s): HGBA1C in the last 72 hours. CBG: No results for input(s): GLUCAP in the last 168 hours. Lipid Profile: No results for input(s): CHOL, HDL, LDLCALC, TRIG, CHOLHDL, LDLDIRECT in the last 72 hours. Thyroid Function Tests: No results for input(s): TSH, T4TOTAL, FREET4, T3FREE, THYROIDAB in the last 72 hours. Anemia Panel: No results for input(s): VITAMINB12, FOLATE, FERRITIN, TIBC, IRON, RETICCTPCT in the last 72 hours. Sepsis Labs: Recent Labs  Lab 10/12/17 1230  LATICACIDVEN 1.29    No results found for this or any previous visit (from the past 240 hour(s)).       Radiology Studies: Ct Abdomen Pelvis Wo Contrast  Result Date: 10/12/2017 CLINICAL DATA:  Abdominal pain. Nausea and vomiting. History of breast cancer. EXAM: CT ABDOMEN AND PELVIS WITHOUT CONTRAST TECHNIQUE: Multidetector CT imaging of the abdomen and pelvis was performed following the standard  protocol without IV contrast. COMPARISON:  CT chest 03/22/2015. FINDINGS: Lower chest: Bronchiectasis identified in the right middle lobe. Calcified granuloma in the posterior right lower lobe. Hepatobiliary: Previous cholecystectomy. The common bile duct measures 1.6 cm in diameter. No focal liver abnormality. Pancreas: The pancreas is unremarkable. Spleen: Calcified granulomas within the spleen. Adrenals/Urinary Tract: The adrenal glands are normal. Bilateral nephrolithiasis. Stones measure up to 4 mm. No kidney mass or hydronephrosis identified. Urinary bladder appears normal. Stomach/Bowel: Non distended stomach. No dilated loops of small bowel. Kaleya Douse moderate stool burden identified within the colon. Vascular/Lymphatic: Mild aortic atherosclerosis. No aneurysm. No adenopathy within the abdomen or pelvis. Reproductive: Status post hysterectomy. No adnexal masses. Other: No abdominal wall hernia or abnormality. No abdominopelvic ascites. Musculoskeletal: Spinal cord stimulator is identified within the soft tissues overlying the posterior right side of the pelvis. There is Namita Yearwood scoliosis deformity involving the lumbar spine. Degenerative disc disease noted. Most advanced at L4-5. There is [place] Crance compression fracture involving the L1 vertebra, unchanged from 03/22/2015. IMPRESSION: 1. No acute findings identified. 2. Bilateral nonobstructing renal calculi. 3. Mild Aortic Atherosclerosis (ICD10-I70.0). 4. Chronic L1 compression fracture. 5. Scoliosis and degenerative disc disease 6. Previous cholecystectomy with increase caliber of the common bile duct measuring 1.6 cm. Electronically Signed   By: Kerby Moors M.D.   On: 10/12/2017 12:21        Scheduled Meds: . acetaminophen  1,000 mg Oral Q8H  . calcium-vitamin D  1 tablet Oral Q breakfast  . enoxaparin (LOVENOX) injection  40 mg Subcutaneous Q24H  . famotidine  20 mg Oral BID  . fluconazole  200 mg Oral QHS  . hydrOXYzine  50 mg Oral Q8H  . ketorolac  15 mg  Intravenous Q6H  . levothyroxine  50 mcg Oral QODAY   And  . levothyroxine  75 mcg Oral QODAY  . lidocaine  1 patch Transdermal Q24H  . losartan  100 mg Oral Daily  . morphine  30 mg Oral Q8H  . pantoprazole  40 mg Oral Daily  . predniSONE  4 mg Oral Q breakfast   Continuous Infusions: . methocarbamol (ROBAXIN)  IV 500 mg (10/14/17 0933)     LOS: 0 days    Time spent: over 30 minutes    Fayrene Helper, MD [facility] Pager [phone]  If 7PM-7AM, please contact night-coverage [email or web address] Password [email or web address] 10/14/2017, 10:11 AM

## 2017-10-14 NOTE — Evaluation (Signed)
Physical Therapy Evaluation Patient Details Name: Daisy [name] MRN: [id number] DOB: [birth year] Today's Date: 10/14/2017   History of Present Illness  Daisy [name] is a 66 y.o. female with medical history significant of MS, addisons disease, chronic back pain with L1 compression fracture with nerve stimulator, history of breast cancer s/p radiation/chemo/surgery, and multiple other medical problems presents with worsening back pain over the past 9 days.   Clinical Impression  Pt admitted as above and presenting with functional mobility limitations 2* generalized weakness, pain and balance deficits.  Pt hopes to progress to dc home with family assist and would benefit from use of RW for home use and from HHPT follow up.    Follow Up Recommendations Home health PT    Equipment Recommendations  Rolling walker with 5" wheels(Pt is 5' tall)    Recommendations for Other Services OT consult     Precautions / Restrictions Precautions Precautions: Back;Fall Restrictions Weight Bearing Restrictions: No      Mobility  Bed Mobility Overal bed mobility: Needs Assistance Bed Mobility: Rolling;Sidelying to Sit;Sit to Sidelying Rolling: +2 for physical assistance;+2 for safety/equipment;Mod assist Sidelying to sit: Mod assist;+2 for safety/equipment;+2 for physical assistance     Sit to sidelying: Mod assist;+2 for physical assistance;+2 for safety/equipment General bed mobility comments: assist for moving shoulders and hips to transfer into sidelying. Assist with bringing LEs over the EOB and for trunk to sit upright. FOr return to bed, assist for bringing LEs back onto bed and guiding shoulders to bed surface.   Transfers Overall transfer level: Needs assistance Equipment used: Rolling walker (2 wheeled) Transfers: Sit to/from Stand Sit to Stand: +2 physical assistance;+2 safety/equipment;Mod assist Stand pivot transfers: Mod assist;+2 physical assistance;+2 safety/equipment       General transfer comment: Did better with sit to stand from elevated bed. Needed more assist to stand from recliner including to scoot hips to EOC and then to help rise and steady. Cues for hand placement to self assist.   Ambulation/Gait Ambulation/Gait assistance: Min assist;+2 safety/equipment Ambulation Distance (Feet): 30 Feet Assistive device: Rolling walker (2 wheeled) Gait Pattern/deviations: Step-through pattern;Decreased step length - right;Decreased step length - left;Shuffle;Trunk flexed Gait velocity: decr Gait velocity interpretation: Below normal speed for age/gender General Gait Details: Increased time, frequent standing rest breaks, cues for posture and position from [employer]            Wheelchair Mobility    Modified Rankin (Stroke Patients Only)       Balance Overall balance assessment: Needs assistance Sitting-balance support: No upper extremity supported;Feet supported Sitting balance-Leahy Scale: Fair     Standing balance support: Bilateral upper extremity supported Standing balance-Leahy Scale: Poor                               Pertinent Vitals/Pain Pain Assessment: 0-10 Pain Score: 4  Pain Location: back, R side. Pain Descriptors / Indicators: Aching;Discomfort Pain Intervention(s): Limited activity within patient's tolerance;Monitored during session;Premedicated before session    Home Living Family/patient expects to be discharged to:: Private residence Living Arrangements: Spouse/significant other Available Help at Discharge: Family;Available 24 hours/day Type of Home: House Home Access: Stairs to enter Entrance Stairs-Rails: Chemical engineer of Steps: 7 Home Layout: Multi-level Home Equipment: Walker - standard;Cane - single point      Prior Function Level of Independence: Needs assistance   Gait / Transfers Assistance Needed: pt has been using a standard walker but  it is very difficult for her to  lift walker.  ADL's / Homemaking Assistance Needed: spouse helps with bathing, dressing and mobilizing as needed.         Hand Dominance        Extremity/Trunk Assessment   Upper Extremity Assessment Upper Extremity Assessment: Generalized weakness    Lower Extremity Assessment Lower Extremity Assessment: Generalized weakness       Communication   Communication: No difficulties  Cognition Arousal/Alertness: Awake/alert Behavior During Therapy: WFL for tasks assessed/performed Overall Cognitive Status: Within Functional Limits for tasks assessed                                        General Comments      Exercises     Assessment/Plan    PT Assessment Patient needs continued PT services  PT Problem List Decreased strength;Decreased activity tolerance;Decreased balance;Decreased mobility;Decreased knowledge of use of DME;Pain       PT Treatment Interventions DME instruction;Gait training;Stair training;Functional mobility training;Therapeutic activities;Therapeutic exercise;Patient/family education    PT Goals (Current goals can be found in the Care Plan section)  Acute Rehab PT Goals Patient Stated Goal: to decrease pain and move easier. PT Goal Formulation: With patient Time For Goal Achievement: 10/27/17 Potential to Achieve Goals: Good    Frequency Min 3X/week   Barriers to discharge        Co-evaluation PT/OT/SLP Co-Evaluation/Treatment: Yes Reason for Co-Treatment: For patient/therapist safety PT goals addressed during session: Mobility/safety with mobility OT goals addressed during session: ADL's and self-care       AM-PAC PT "6 Clicks" Daily Activity  Outcome Measure Difficulty turning over in bed (including adjusting bedclothes, sheets and blankets)?: Unable Difficulty moving from lying on back to sitting on the side of the bed? : Unable Difficulty sitting down on and standing up from a chair with arms (e.g., wheelchair,  bedside commode, etc,.)?: Unable Help needed moving to and from a bed to chair (including a wheelchair)?: A Lot Help needed walking in hospital room?: A Lot Help needed climbing 3-5 steps with a railing? : A Lot 6 Click Score: 9    End of Session   Activity Tolerance: Patient limited by fatigue;Patient limited by pain Patient left: in bed;with call bell/phone within reach;with bed alarm set Nurse Communication: Mobility status PT Visit Diagnosis: Unsteadiness on feet (R26.81);Muscle weakness (generalized) (M62.81);History of falling (Z91.81);Difficulty in walking, not elsewhere classified (R26.2);Pain Pain - part of body: (back)    Time: 8127-5170 PT Time Calculation (min) (ACUTE ONLY): 67 min   Charges:   PT Evaluation $PT Eval Moderate Complexity: 1 Mod PT Treatments $Gait Training: 8-22 mins   PT G Codes:        Pg [phone]   Aleathia Purdy 10/14/2017, 3:09 PM

## 2017-10-14 NOTE — Evaluation (Addendum)
Occupational Therapy Evaluation Patient Details Name: Daisy [name] MRN: [id number] DOB: [birth year] Today's Date: 10/14/2017    History of Present Illness Daisy [name] is a 66 y.o. female with medical history significant of MS, addisons disease, chronic back pain with L1 compression fracture with nerve stimulator, history of breast cancer s/p radiation/chemo/surgery, and multiple other medical problems presents with worsening back pain over the past 9 days.    Clinical Impression   Pt limited by nausea, lightheadedness and pain this visit. Was able to perform functional mobility with walker with +2 assist into hallway and then had to have chair pulled up. Began education on 3in1 option for her commode and other DME. She will benefit from continued OT education to increase independence and safety with her ADL. Will follow.    Follow Up Recommendations  Supervision/Assistance - 24 hour;Home health OT    Equipment Recommendations  3 in 1 bedside commode;Other (comment)(RW)    Recommendations for Other Services       Precautions / Restrictions Precautions Precautions: Back;Fall Restrictions Weight Bearing Restrictions: No      Mobility Bed Mobility Overal bed mobility: Needs Assistance Bed Mobility: Rolling;Sidelying to Sit;Sit to Sidelying Rolling: +2 for physical assistance;+2 for safety/equipment;Mod assist Sidelying to sit: Mod assist;+2 for safety/equipment;+2 for physical assistance     Sit to sidelying: Mod assist;+2 for physical assistance;+2 for safety/equipment General bed mobility comments: assist for moving shoulders and hips to transfer into sidelying. Assist with bringing LEs over the EOB and for trunk to sit up right. FOr return to bed, assist for bringing LEs back onto bed and guiding shoulders to bed surface.   Transfers Overall transfer level: Needs assistance Equipment used: Rolling walker (2 wheeled) Transfers: Sit to/from Stand Sit to Stand: +2  physical assistance;+2 safety/equipment;Mod assist         General transfer comment: Did better with sit to stand from elevated bed. Needed more assist to stand from recliner including to scoot hips to EOC and then to help rise and steady. Cues for hand placement to self assist.     Balance                                           ADL either performed or assessed with clinical judgement   ADL Overall ADL's : Needs assistance/impaired Eating/Feeding: Independent;Bed level   Grooming: Wash/dry face;Sitting;Set up   Upper Body Bathing: Minimal assistance;Sitting   Lower Body Bathing: Moderate assistance;+2 for safety/equipment;Sit to/from stand;+2 for physical assistance   Upper Body Dressing : Minimal assistance;Sitting   Lower Body Dressing: +2 for physical assistance;+2 for safety/equipment;Maximal assistance;Sit to/from stand   Toilet Transfer: +2 for physical assistance;+2 for safety/equipment;Minimal assistance;Ambulation;RW   Toileting- Clothing Manipulation and Hygiene: +2 for physical assistance;+2 for safety/equipment;Maximal assistance;Sit to/from stand Toileting - Clothing Manipulation Details (indicate cue type and reason): unable to free hands from RW to manage gown today.       General ADL Comments: Pt currently with nausea and lightheadedness during functional mobility as well as pain in back and right side. She is currently unable to free her hands from the walker to be able to manage clothing herself. She is very slow to perform tasks due to pain and the nausea. Educated on safety with walker and sequence for use. Discussed DME options including tubbench and 3in1 and she is currently interested in 3in1 but states  her bathroom is not on her main level to be able to use right now.      Vision Patient Visual Report: Blurring of vision(pt relates this is due to her feeling so lightheaded.)       Perception     Praxis      Pertinent  Vitals/Pain Pain Assessment: 0-10 Pain Score: 4  Pain Location: back, R side. Pain Descriptors / Indicators: Aching;Discomfort Pain Intervention(s): Monitored during session     Hand Dominance     Extremity/Trunk Assessment Upper Extremity Assessment Upper Extremity Assessment: Generalized weakness           Communication Communication Communication: No difficulties   Cognition Arousal/Alertness: Awake/alert Behavior During Therapy: WFL for tasks assessed/performed Overall Cognitive Status: Within Functional Limits for tasks assessed                                     General Comments       Exercises     Shoulder Instructions      Home Living Family/patient expects to be discharged to:: Private residence Living Arrangements: Spouse/significant other Available Help at Discharge: Family;Available 24 hours/day Type of Home: House Home Access: Stairs to enter [employer] of Steps: 7   Home Layout: Multi-level     Bathroom Shower/Tub: Teacher, early years/pre: Handicapped height     Home Equipment: Environmental consultant - standard;Cane - single point          Prior Functioning/Environment Level of Independence: Needs assistance  Gait / Transfers Assistance Needed: pt has been using a standard walker but it is very difficult for her to lift walker. ADL's / Homemaking Assistance Needed: spouse helps with bathing, dressing and mobilizing as needed.             OT Problem List: Decreased strength;Decreased activity tolerance;Decreased knowledge of use of DME or AE;Pain      OT Treatment/Interventions: Self-care/ADL training;DME and/or AE instruction;Therapeutic activities;Patient/family education    OT Goals(Current goals can be found in the care plan section) Acute Rehab OT Goals Patient Stated Goal: to decrease pain and move easier. OT Goal Formulation: With patient Time For Goal Achievement: 10/21/17 Potential to Achieve Goals:  Good  OT Frequency: Min 2X/week   Barriers to D/C:            Co-evaluation PT/OT/SLP Co-Evaluation/Treatment: Yes Reason for Co-Treatment: Other (comment);For patient/therapist safety(pain\)   OT goals addressed during session: ADL's and self-care;Proper use of Adaptive equipment and DME      AM-PAC PT "6 Clicks" Daily Activity     Outcome Measure Help from another person eating meals?: A Little Help from another person taking care of personal grooming?: A Little Help from another person toileting, which includes using toliet, bedpan, or urinal?: A Lot Help from another person bathing (including washing, rinsing, drying)?: A Lot Help from another person to put on and taking off regular upper body clothing?: A Little Help from another person to put on and taking off regular lower body clothing?: A Lot 6 Click Score: 15   End of Session Equipment Utilized During Treatment: Rolling walker Nurse Communication: Mobility status  Activity Tolerance: Patient limited by pain;Other (comment)(lightheadedness, nausea) Patient left: in bed;with call bell/phone within reach  OT Visit Diagnosis: Unsteadiness on feet (R26.81);Muscle weakness (generalized) (M62.81);Pain Pain - part of body: (back )  Time: 1035-1140 OT Time Calculation (min): 65 min Charges:  OT General Charges $OT Visit: 1 Visit OT Evaluation $OT Eval Moderate Complexity: 1 Mod OT Treatments $Therapeutic Activity: 8-22 mins G-Codes: OT G-codes **NOT FOR INPATIENT CLASS** Functional Limitation: Self care Self Care Current Status (H9093): At least 60 percent but less than 80 percent impaired, limited or restricted Self Care Goal Status (J1216): At least 20 percent but less than 40 percent impaired, limited or restricted     Philippa Chester 10/14/2017, 1:25 PM

## 2017-10-15 ENCOUNTER — Inpatient Hospital Stay (HOSPITAL_COMMUNITY): Payer: Managed Care, Other (non HMO)

## 2017-10-15 LAB — BASIC METABOLIC PANEL
ANION GAP: 7 (ref 5–15)
BUN: 17 mg/dL (ref 6–20)
CO2: 25 mmol/L (ref 22–32)
Calcium: 8.2 mg/dL — ABNORMAL LOW (ref 8.9–10.3)
Chloride: 103 mmol/L (ref 101–111)
Creatinine, Ser: 0.86 mg/dL (ref 0.44–1.00)
Glucose, Bld: 87 mg/dL (ref 65–99)
POTASSIUM: 4.2 mmol/L (ref 3.5–5.1)
SODIUM: 135 mmol/L (ref 135–145)

## 2017-10-15 NOTE — Progress Notes (Signed)
Patient went to MRI and complained of claustrophobia. Patient refused MRI and came back to room.

## 2017-10-15 NOTE — Consult Note (Signed)
Chief complaint: Back pain with severe muscle spasms History of present illness: [name] is a 66 year old individual known to my practice from previous a spondylosis and scoliosis of lumbar spine. I had more recently placed a spinal cord stimulator in her back. She tells me that about 2 weeks ago she had a GI manometry study during which time she had to be placed in some awkward positions. At that time she experienced significant back pain which has persisted and became more severe this past Friday. She cannot be mobilized on her own and she came to the emergency department was ultimately admitted for pain control. Patient had CT scan of the abdomen. She has an old L1 compression fracture in addition to a severe scoliosis of the lumbar spine apex to the left at the L3 vertebrae. Pain control has been a significant issue. Patient is seen managed by Dr. Nicholaus [name]. She has had previous epidural injections but has not noted much relief with those injections. In addition she has a history of multiple sclerosis and Addison's disease and it is recently been advised against further steroid injections. An MRI was performed this past July which demonstrates significant spondylitic disease but an amply patent canal throughout her lower lumbar spine.  Past medical history social history systems review is as noted elsewhere in this chart.  Physical examination: Patient is awake alert and oriented. Her motor function appears intact in iliopsoas cause tibialis anterior and gastrocs. Deep tendon reflexes have been absent. Sensation appears grossly intact in lower extremities. Patient was not ambulated today. Impression patient has evidence of chronic back pain with significant spasm in her lower extremities. Impression: She does have a significant osteoporosis and the concern that she could've developed a subacute fracture over the past few weeks exists. The CT scan was not able to demonstrate any such fracture  I believe then an MRI would be more sensitive. She had an attempt at obtaining the MRI this evening but was not successful. Perhaps this can be retried tomorrow. They'll continue to follow

## 2017-10-15 NOTE — Progress Notes (Signed)
Physical Therapy Treatment Patient Details Name: Daisy [name] MRN: [id number] DOB: [birth year] Today's Date: 10/15/2017    History of Present Illness Daisy [name] is a 66 y.o. female with medical history significant of MS, addisons disease, chronic back pain with L1 compression fracture with nerve stimulator, history of breast cancer s/p radiation/chemo/surgery, and multiple other medical problems presents with worsening back pain over the past 9 days.     PT Comments    Pt tolerated increased overall activity level today, she ambulated 84' +20' with RW with min/guard assist. Decreased overall assistance needed for bed mobility and transfers, she is progressing well with mobility.    Follow Up Recommendations  Home health PT     Equipment Recommendations  Rolling walker with 5" wheels;3in1 (PT)(Pt is 5' tall)    Recommendations for Other Services OT consult     Precautions / Restrictions Precautions Precautions: Back;Fall Restrictions Weight Bearing Restrictions: No    Mobility  Bed Mobility Overal bed mobility: Needs Assistance Bed Mobility: Rolling;Sidelying to Sit Rolling: Min guard Sidelying to sit: Min guard       General bed mobility comments: VCs for technique, increased time/effort  Transfers Overall transfer level: Needs assistance   Transfers: Sit to/from Stand Sit to Stand: Min guard         General transfer comment: VCs for safe hand placement  Ambulation/Gait   Ambulation Distance (Feet): 50 Feet(30' + 20' with seated rest break) Assistive device: Rolling walker (2 wheeled) Gait Pattern/deviations: Step-through pattern;Decreased step length - right;Decreased step length - left;Shuffle;Trunk flexed Gait velocity: decr Gait velocity interpretation: Below normal speed for age/gender General Gait Details: Increased time, frequent standing rest breaks, cues for posture and position from RW, distance limited by pain/dizziness   Stairs             Wheelchair Mobility    Modified Rankin (Stroke Patients Only)       Balance Overall balance assessment: Needs assistance Sitting-balance support: No upper extremity supported;Feet supported Sitting balance-Leahy Scale: Fair     Standing balance support: Bilateral upper extremity supported Standing balance-Leahy Scale: Poor                              Cognition Arousal/Alertness: Awake/alert Behavior During Therapy: WFL for tasks assessed/performed Overall Cognitive Status: Within Functional Limits for tasks assessed                                        Exercises      General Comments        Pertinent Vitals/Pain Pain Assessment: 0-10 Pain Score: 8  Pain Location: all around mid section down to both feet Pain Descriptors / Indicators: Sore;Radiating Pain Intervention(s): Limited activity within patient's tolerance;Monitored during session    Home Living                      Prior Function            PT Goals (current goals can now be found in the care plan section) Acute Rehab PT Goals Patient Stated Goal: to decrease pain and move easier. PT Goal Formulation: With patient Time For Goal Achievement: 10/27/17 Potential to Achieve Goals: Good Progress towards PT goals: Progressing toward goals    Frequency    Min 3X/week      PT Plan  Current plan remains appropriate    Co-evaluation PT/OT/SLP Co-Evaluation/Treatment: Yes Reason for Co-Treatment: For patient/therapist safety PT goals addressed during session: Mobility/safety with mobility;Proper use of DME OT goals addressed during session: Strengthening/ROM      AM-PAC PT "6 Clicks" Daily Activity  Outcome Measure  Difficulty turning over in bed (including adjusting bedclothes, sheets and blankets)?: A Little Difficulty moving from lying on back to sitting on the side of the bed? : A Little Difficulty sitting down on and standing up from a chair  with arms (e.g., wheelchair, bedside commode, etc,.)?: A Little Help needed moving to and from a bed to chair (including a wheelchair)?: A Little Help needed walking in hospital room?: A Little Help needed climbing 3-5 steps with a railing? : A Lot 6 Click Score: 17    End of Session Equipment Utilized During Treatment: Gait belt Activity Tolerance: Patient limited by fatigue;Patient limited by pain Patient left: with call bell/phone within reach;in chair Nurse Communication: Mobility status PT Visit Diagnosis: Unsteadiness on feet (R26.81);Muscle weakness (generalized) (M62.81);History of falling (Z91.81);Difficulty in walking, not elsewhere classified (R26.2);Pain Pain - part of body: (back)     Time: 1202-1228 PT Time Calculation (min) (ACUTE ONLY): 26 min  Charges:  $Gait Training: 8-22 mins                    G Codes:          Philomena Doheny 10/15/2017, 12:51 PM [phone]

## 2017-10-15 NOTE — Progress Notes (Signed)
Occupational Therapy Treatment Patient Details Name: Daisy [name] MRN: [id number] DOB: [birth year] Today's Date: 10/15/2017    History of present illness Daisy [name] is a 66 y.o. female with medical history significant of MS, addisons disease, chronic back pain with L1 compression fracture with nerve stimulator, history of breast cancer s/p radiation/chemo/surgery, and multiple other medical problems presents with worsening back pain over the past 9 days.    OT comments  This 66 yo female admitted with above seen today in conjunction with PT due to how pt did yesterday. Pt doing much better mobility wise today which is leading to increased independence with basic ADLs. She will continue to benefit  from acute OT with follow up HHOT and 24 hour S/prn A from husband.  Follow Up Recommendations  Supervision/Assistance - 24 hour;Home health OT    Equipment Recommendations  3 in 1 bedside commode;Other (comment)(RW)       Precautions / Restrictions Precautions Precautions: Back;Fall Restrictions Weight Bearing Restrictions: No       Mobility Bed Mobility Overal bed mobility: Needs Assistance Bed Mobility: Rolling;Sidelying to Sit Rolling: Min guard Sidelying to sit: Min guard       General bed mobility comments: VCs for sequencing  Transfers Overall transfer level: Needs assistance   Transfers: Sit to/from Stand Sit to Stand: Min guard         General transfer comment: VCs for safe hand placement    Balance Overall balance assessment: Needs assistance Sitting-balance support: No upper extremity supported;Feet supported Sitting balance-Leahy Scale: Fair     Standing balance support: Bilateral upper extremity supported Standing balance-Leahy Scale: Poor                             ADL either performed or assessed with clinical judgement   ADL Overall ADL's : Needs assistance/impaired                       Lower Body Dressing Details  (indicate cue type and reason): reports husband A her with all LBD Toilet Transfer: Ambulation;RW;Min guard Toilet Transfer Details (indicate cue type and reason): bed>out and down part of hallway>sit in recliner                 Vision Patient Visual Report: No change from baseline            Cognition Arousal/Alertness: Awake/alert Behavior During Therapy: WFL for tasks assessed/performed Overall Cognitive Status: Within Functional Limits for tasks assessed                                                     Pertinent Vitals/ Pain       Pain Assessment: 0-10 Pain Score: 8  Pain Location: all around mid section down to both feet Pain Descriptors / Indicators: Sore Pain Intervention(s): Limited activity within patient's tolerance;Monitored during session;Repositioned;Premedicated before session     Prior Functioning/Environment              Frequency  Min 2X/week        Progress Toward Goals  OT Goals(current goals can now be found in the care plan section)  Progress towards OT goals: Progressing toward goals     Plan Discharge plan remains appropriate    Co-evaluation  PT/OT/SLP Co-Evaluation/Treatment: Yes Reason for Co-Treatment: For patient/therapist safety;To address functional/ADL transfers   OT goals addressed during session: Strengthening/ROM      AM-PAC PT "6 Clicks" Daily Activity     Outcome Measure   Help from another person eating meals?: None Help from another person taking care of personal grooming?: A Little Help from another person toileting, which includes using toliet, bedpan, or urinal?: A Little Help from another person bathing (including washing, rinsing, drying)?: A Lot Help from another person to put on and taking off regular upper body clothing?: A Little Help from another person to put on and taking off regular lower body clothing?: A Lot 6 Click Score: 17    End of Session Equipment Utilized  During Treatment: Gait belt;Rolling walker  OT Visit Diagnosis: Unsteadiness on feet (R26.81);Muscle weakness (generalized) (M62.81);Pain Pain - Right/Left: (both) Pain - part of body: (around mid section down to feet)   Activity Tolerance Patient tolerated treatment well   Patient Left in chair;with call bell/phone within reach   Nurse Communication          Time: 1202-1231 OT Time Calculation (min): 29 min  Charges: OT General Charges $OT Visit: 1 Visit OT Treatments $Self Care/Home Management : 8-22 mins  [place] [address], OTR/L [phone] 10/15/2017

## 2017-10-15 NOTE — Progress Notes (Signed)
PROGRESS NOTE    Daisy [name]  VFI:[id number] DOB: 1951/09/13 DOA: 10/12/2017 PCP: Lawerance Cruel, MD  Brief Narrative:  Daisy [name] is a 66 y.o. female with medical history significant of MS, addisons disease, chronic back pain with L1 compression fracture with nerve stimulator, history of breast cancer s/p radiation/chemo/surgery, and multiple other medical problems presents with worsening back pain over the past 9 days.   The patient notes that since the manometry last week she's had worsening pain. She notes that during the manometry they had her in several different positions which she takes aggravated her pain. Since then she's had pain to the extent that it's difficult to walk or sit up and movement is excruciating. She notes that she hasn't been up since yesterday due to the pain. She describes the pain as being sharp and burning. It radiates down both legs. The pain is worse on the right side. She notes numbness in her feet and legs is more chronic. She denies any incontinence of stool. She does note that she has a hard time emptying her bladder. This is a chronic problem, but it seems a little worse.  She notes sensitivity to touch. She first describes saddle anesthesia, but on exam she did not have any. She follows with Dr. Ellene Route from neurosurgery.  Assessment & Plan:   Active Problems:   Addison's disease ([place])   Thyroid disease   History of left breast cancer   Back pain   Degenerative disc disease, lumbar   Compression fracture of first lumbar vertebra (HCC)   GERD (gastroesophageal reflux disease)   Chronic Back Pain with chronic C1 compression fracture with exacerbation  Degenerative disc disease  Abdominal Pain: Sitting up in chair today, looks better overall.  Has abdominal pain still, but with negative CT abdomen pelvis, think this is referred pain.   Reinforced goals to control pain and also try to mobilize her (was under impression we wanted to keep her on  bedrest).   Follows with Dr. Ellene Route.  Had recent MRI in 05/2017.  Case was discussed with Dr. Annette Stable from neurosurgery who rec admit for pain control, obs, f/u with Dr. Ellene Route Monday, maybe reimaging then.  Pt had some urinary retention, but has had chronic problems with this.  Difficult to tell if this worsening is due to severe pain, but this may be the case.  No saddle anesthesia and equal strength to bilateral LE's.  She's got tenderness of her abdomen as well, but this seems to be referred pain with CT without acute findings. Will attempt pain control with home morphine, scheduled toradol, scheduled APAP, liodcaine patch, prn robaxin, prn IV morphine.  Allergy to gabapentin.  Could consider other nonopiate meds like lyrica or cymbalta, but she notes wanting to try to limit meds.   She's opioid tolerant and did not get much relief with morphine 6 mg in ED (takes MSIR 30 TID at home) Hydroxyzine for nausea (notes this is all that works). Zofran as well. PT/OT when tolerated [ ]  discussed with Dr. Ellene Route today, who will see her.  Rec MRI L spine without contrast.  Urinary retention: Foley removed.  No longer retaining.  Hypothyroidism: continue synthroid Candidal esophagitis: continue fluconazole (about 7 days into 2 week course at presentation) GERD: continue PPI and H2 blocker Addisons: continue prednisone MS: not on any medications for this History of breast cancer: s/p chemo/rad/surgery Soft diet for esophageal dysmotility  DVT prophylaxis: lovenox Code Status: full  Family Communication: none  at bedside Disposition Plan: pending improvement   Consultants:   Neurosurgery  Procedures: (Don't include imaging studies which can be auto populated. Include things that cannot be auto populated i.e. Echo, Carotid and venous dopplers, Foley, Bipap, HD, tubes/drains, wound vac, central lines etc)  none  Antimicrobials: (specify start and planned stop date. Auto populated tables are space  occupying and do not give end dates) Anti-infectives (From admission, onward)   Start     Dose/Rate Route Frequency Ordered Stop   10/12/17 2315  fluconazole (DIFLUCAN) 40 MG/ML suspension 200 mg     200 mg Oral Daily at bedtime 10/12/17 2302     10/12/17 2200  fluconazole (DIFLUCAN) 40 MG/ML suspension 200 mg  Status:  Discontinued     200 mg Oral Daily at bedtime 10/12/17 2016 10/12/17 2018   10/12/17 2200  fluconazole (DIFLUCAN) tablet 200 mg  Status:  Discontinued     200 mg Oral Daily at bedtime 10/12/17 2018 10/12/17 2302      Subjective: Mood is down today. Pain somewhat better. Trying to work with PT.  Objective: Vitals:   10/14/17 1351 10/14/17 2141 10/15/17 0446 10/15/17 0725  BP: 124/68 (!) 155/75 139/73 140/84  Pulse: 80 71 72 72  Resp: 16 15 15    Temp: 98.3 F (36.8 C) (!) 96.6 F (35.9 C) 97.9 F (36.6 C)   TempSrc: Oral Axillary Oral   SpO2: 100% 99% 100%   Weight:      Height:        Intake/Output Summary (Last 24 hours) at 10/15/2017 1555 Last data filed at 10/15/2017 1154 Gross per 24 hour  Intake 2180 ml  Output 1625 ml  Net 555 ml   Filed Weights   10/12/17 1056  Weight: 62.6 kg (138 lb)    Examination:  General: No acute distress. Cardiovascular: Heart sounds show a regular rate, and rhythm. No gallops or rubs. No murmurs. No JVD. Lungs: Clear to auscultation bilaterally with good air movement. No rales, rhonchi or wheezes. Abdomen: Soft, TTP in RLQ, nondistended with normal active bowel sounds. No masses. No hepatosplenomegaly. Neurological: Alert and oriented 3. Moves all extremities 4 with equal strength. Cranial nerves II through XII grossly intact. Skin: Warm and dry. No rashes or lesions. Extremities: No clubbing or cyanosis. No edema. Pedal pulses 2+. Psychiatric: Mood and affect are normal. Insight and judgment are appropriate.  Data Reviewed: I have personally reviewed following labs and imaging studies  CBC: Recent Labs  Lab  10/12/17 1217 10/13/17 0615  WBC 7.9 6.9  NEUTROABS 5.8  --   HGB 13.3 12.3  HCT 39.4 37.3  MCV 82.3 82.3  PLT 246 258   Basic Metabolic Panel: Recent Labs  Lab 10/12/17 1217 10/13/17 0615 10/14/17 1044 10/15/17 0509  NA 135 134* 131* 135  K 4.9 4.1 4.5 4.2  CL 99* 102 99* 103  CO2 26 25 20* 25  GLUCOSE 116* 77 116* 87  BUN 19 15 18 17   CREATININE 0.67 0.78 0.86 0.86  CALCIUM 9.1 8.3* 8.3* 8.2*   GFR: Estimated Creatinine Clearance: 55.3 mL/min (by C-G formula based on SCr of 0.86 mg/dL). Liver Function Tests: Recent Labs  Lab 10/12/17 1217  AST 21  ALT 14  ALKPHOS 84  BILITOT 0.2*  PROT 7.4  ALBUMIN 3.8   Recent Labs  Lab 10/12/17 1217  LIPASE 17   No results for input(s): AMMONIA in the last 168 hours. Coagulation Profile: No results for input(s): INR, PROTIME in the last  168 hours. Cardiac Enzymes: No results for input(s): CKTOTAL, CKMB, CKMBINDEX, TROPONINI in the last 168 hours. BNP (last 3 results) No results for input(s): PROBNP in the last 8760 hours. HbA1C: No results for input(s): HGBA1C in the last 72 hours. CBG: No results for input(s): GLUCAP in the last 168 hours. Lipid Profile: No results for input(s): CHOL, HDL, LDLCALC, TRIG, CHOLHDL, LDLDIRECT in the last 72 hours. Thyroid Function Tests: No results for input(s): TSH, T4TOTAL, FREET4, T3FREE, THYROIDAB in the last 72 hours. Anemia Panel: No results for input(s): VITAMINB12, FOLATE, FERRITIN, TIBC, IRON, RETICCTPCT in the last 72 hours. Sepsis Labs: Recent Labs  Lab 10/12/17 1230  LATICACIDVEN 1.29    No results found for this or any previous visit (from the past 240 hour(s)).       Radiology Studies: No results found.      Scheduled Meds: . acetaminophen  1,000 mg Oral Q8H  . calcium-vitamin D  1 tablet Oral Q breakfast  . enoxaparin (LOVENOX) injection  40 mg Subcutaneous Q24H  . famotidine  20 mg Oral BID  . fluconazole  200 mg Oral QHS  . hydrOXYzine  50 mg  Oral Q8H  . ketorolac  15 mg Intravenous Q6H  . levothyroxine  50 mcg Oral QODAY   And  . levothyroxine  75 mcg Oral QODAY  . lidocaine  1 patch Transdermal Q24H  . losartan  100 mg Oral Daily  . morphine  30 mg Oral Q8H  . pantoprazole  40 mg Oral Daily  . predniSONE  4 mg Oral Q breakfast   Continuous Infusions: . sodium chloride 75 mL/hr at 10/15/17 0137  . methocarbamol (ROBAXIN)  IV Stopped (10/15/17 1056)     LOS: 1 day    Time spent: over 30 minutes    Fayrene Helper, MD [facility] Pager [phone]  If 7PM-7AM, please contact night-coverage [email or web address] Password [email or web address] 10/15/2017, 3:55 PM

## 2017-10-15 NOTE — Progress Notes (Signed)
Discharge planning, spoke with patient at beside. Chose [facility] for [facility] services, contacted [facility] for referral. Needs RW and 3-n-1, contacted [facility] to deliver to room. [phone]

## 2017-10-16 ENCOUNTER — Inpatient Hospital Stay (HOSPITAL_COMMUNITY): Payer: Managed Care, Other (non HMO)

## 2017-10-16 MED ORDER — CYCLOBENZAPRINE HCL 5 MG PO TABS
5.0000 mg | ORAL_TABLET | Freq: Three times a day (TID) | ORAL | Status: DC | PRN
  Administered 2017-10-17 (×3): 10 mg via ORAL
  Filled 2017-10-16 (×3): qty 2

## 2017-10-16 MED ORDER — ONDANSETRON 4 MG PO TBDP
4.0000 mg | ORAL_TABLET | Freq: Once | ORAL | Status: AC | PRN
  Administered 2017-10-16: 4 mg via ORAL
  Filled 2017-10-16: qty 1

## 2017-10-16 MED ORDER — MAGNESIUM HYDROXIDE 400 MG/5ML PO SUSP
15.0000 mL | Freq: Once | ORAL | Status: DC
  Filled 2017-10-16: qty 30

## 2017-10-16 MED ORDER — DIAZEPAM 5 MG/ML IJ SOLN
2.5000 mg | Freq: Once | INTRAMUSCULAR | Status: AC | PRN
  Administered 2017-10-16: 2.5 mg via INTRAVENOUS
  Filled 2017-10-16: qty 2

## 2017-10-16 MED ORDER — IBUPROFEN 400 MG PO TABS
800.0000 mg | ORAL_TABLET | Freq: Three times a day (TID) | ORAL | Status: DC
  Administered 2017-10-17 – 2017-10-18 (×5): 800 mg via ORAL
  Filled 2017-10-16 (×6): qty 2

## 2017-10-16 MED ORDER — DIAZEPAM 5 MG/ML IJ SOLN
2.5000 mg | Freq: Once | INTRAMUSCULAR | Status: AC
  Administered 2017-10-16: 2.5 mg via INTRAVENOUS
  Filled 2017-10-16: qty 2

## 2017-10-16 MED ORDER — SENNA 8.6 MG PO TABS
1.0000 | ORAL_TABLET | Freq: Two times a day (BID) | ORAL | Status: DC
  Administered 2017-10-17 – 2017-10-18 (×2): 8.6 mg via ORAL
  Filled 2017-10-16 (×4): qty 1

## 2017-10-16 NOTE — Progress Notes (Signed)
Physical Therapy Treatment Patient Details Name: Daisy [name] MRN: [id number] DOB: [birth year] Today's Date: 10/16/2017    History of Present Illness Daisy [name] is a 66 y.o. female with medical history significant of MS, addisons disease, chronic back pain with L1 compression fracture with nerve stimulator, history of breast cancer s/p radiation/chemo/surgery, and multiple other medical problems presents with worsening back pain over the past 9 days.     PT Comments    Pt c/o nausea this morning, RN notified. She tolerated increased distance of 120' with ambulation. Gait is slow and labored 2* pain/nausea. Initiated stair training.   Follow Up Recommendations  Home health PT     Equipment Recommendations  Rolling walker with 5" wheels;3in1 (PT)(Pt is 5' tall)    Recommendations for Other Services OT consult     Precautions / Restrictions Precautions Precautions: Back;Fall Restrictions Weight Bearing Restrictions: No    Mobility  Bed Mobility Overal bed mobility: Needs Assistance Bed Mobility: Rolling;Sidelying to Sit;Sit to Supine Rolling: Supervision Sidelying to sit: Supervision     Sit to sidelying: Min assist General bed mobility comments: min A for LEs into bed  Transfers Overall transfer level: Needs assistance Equipment used: Rolling walker (2 wheeled) Transfers: Sit to/from Stand Sit to Stand: Supervision         General transfer comment: VCs for safe hand placement  Ambulation/Gait   Ambulation Distance (Feet): 120 Feet Assistive device: Rolling walker (2 wheeled) Gait Pattern/deviations: Step-through pattern;Decreased step length - right;Decreased step length - left;Shuffle;Trunk flexed Gait velocity: decr   General Gait Details: Increased time, frequent standing rest breaks, cues for posture and position from RW, distance limited by pain/dizziness   Stairs Stairs: Yes   Stair Management: One rail Right;With cane;Step to  pattern;Forwards Number of Stairs: 3 General stair comments: VCs sequencing  Wheelchair Mobility    Modified Rankin (Stroke Patients Only)       Balance Overall balance assessment: Needs assistance Sitting-balance support: No upper extremity supported;Feet supported Sitting balance-Leahy Scale: Fair     Standing balance support: Bilateral upper extremity supported Standing balance-Leahy Scale: Poor                              Cognition Arousal/Alertness: Awake/alert Behavior During Therapy: WFL for tasks assessed/performed Overall Cognitive Status: Within Functional Limits for tasks assessed                                        Exercises      General Comments        Pertinent Vitals/Pain Pain Score: 8  Pain Location: all around mid section down to both feet, worse on R Pain Descriptors / Indicators: Sore;Radiating Pain Intervention(s): Limited activity within patient's tolerance;Premedicated before session;Monitored during session    Home Living                      Prior Function            PT Goals (current goals can now be found in the care plan section) Acute Rehab PT Goals Patient Stated Goal: to decrease pain and move easier. PT Goal Formulation: With patient Time For Goal Achievement: 10/27/17 Potential to Achieve Goals: Good Progress towards PT goals: Progressing toward goals    Frequency    Min 3X/week      PT  Plan Current plan remains appropriate    Co-evaluation              AM-PAC PT "6 Clicks" Daily Activity  Outcome Measure  Difficulty turning over in bed (including adjusting bedclothes, sheets and blankets)?: A Little Difficulty moving from lying on back to sitting on the side of the bed? : A Little Difficulty sitting down on and standing up from a chair with arms (e.g., wheelchair, bedside commode, etc,.)?: A Little Help needed moving to and from a bed to chair (including a  wheelchair)?: A Little Help needed walking in hospital room?: A Little Help needed climbing 3-5 steps with a railing? : A Little 6 Click Score: 18    End of Session Equipment Utilized During Treatment: Gait belt Activity Tolerance: Patient limited by fatigue;Patient limited by pain Patient left: with call bell/phone within reach;in bed Nurse Communication: Mobility status PT Visit Diagnosis: Unsteadiness on feet (R26.81);Muscle weakness (generalized) (M62.81);History of falling (Z91.81);Difficulty in walking, not elsewhere classified (R26.2);Pain Pain - part of body: (back)     Time: 5003-7048 PT Time Calculation (min) (ACUTE ONLY): 33 min  Charges:  $Gait Training: 23-37 mins                    G Codes:       {   Philomena Doheny 10/16/2017, 10:10 AM [phone]

## 2017-10-16 NOTE — Progress Notes (Addendum)
PROGRESS NOTE    Daisy [name]  IWL:[id number] DOB: [birth year] DOA: 10/12/2017 PCP: Lawerance Cruel, MD  Brief Narrative:  Daisy [name] is a 66 y.o. female with medical history significant of MS, addisons disease, chronic back pain with L1 compression fracture with nerve stimulator, history of breast cancer s/p radiation/chemo/surgery, and multiple other medical problems presents with worsening back pain over the past 9 days.   The patient notes that since the manometry last week she's had worsening pain. She notes that during the manometry they had her in several different positions which she takes aggravated her pain. Since then she's had pain to the extent that it's difficult to walk or sit up and movement is excruciating. She notes that she hasn't been up since yesterday due to the pain. She describes the pain as being sharp and burning. It radiates down both legs. The pain is worse on the right side. She notes numbness in her feet and legs is more chronic. She denies any incontinence of stool. She does note that she has a hard time emptying her bladder. This is a chronic problem, but it seems a little worse.  She notes sensitivity to touch. She first describes saddle anesthesia, but on exam she did not have any. She follows with Dr. Ellene Route from neurosurgery.  Assessment & Plan:   Active Problems:   Addison's disease ([place])   Thyroid disease   History of left breast cancer   Back pain   Degenerative disc disease, lumbar   Compression fracture of first lumbar vertebra (HCC)   GERD (gastroesophageal reflux disease)   Chronic Back Pain with chronic C1 compression fracture with exacerbation  Degenerative disc disease  Abdominal Pain: Overall seems to be improving.  Was seen by Dr. Ellene Route overnight last night, appreciate assistance.   At this point in time, planning for MRI of lumbar spine to r/o subacute fracture.  Unable to be performed last night due, will attempt this  afternoon with valium premedication.   Follows with Dr. Ellene Route.  Had recent MRI in 05/2017.  Case was discussed with Dr. Annette Stable from neurosurgery who rec admit for pain control, obs, f/u with Dr. Ellene Route Monday, maybe reimaging then.  Pt had some urinary retention, but has had chronic problems with this.  Difficult to tell if this worsening is due to severe pain, but this may be the case.  No saddle anesthesia and equal strength to bilateral LE's.  She's got tenderness of her abdomen as well, but this seems to be referred pain with CT without acute findings. Will attempt pain control with home morphine, scheduled APAP, liodcaine patch. Will try to switch toradol to ibuprofen and robaxin to flexeril PO. Allergy to gabapentin.  Could consider other nonopiate meds like lyrica or cymbalta, but she notes wanting to try to limit meds.   Hydroxyzine for nausea (notes this is all that works). Zofran as well. PT/OT recommending home health PT, rolling walker with 5 inch wheels, 3 in 1.   Urinary retention: Foley removed.  No longer retaining.  Hypothyroidism: continue synthroid Candidal esophagitis: continue fluconazole (about 7 days into 2 week course at presentation - last dose is 11/14) GERD: continue PPI and H2 blocker Addisons: continue prednisone MS: not on any medications for this History of breast cancer: s/p chemo/rad/surgery Soft diet for esophageal dysmotility  DVT prophylaxis: lovenox Code Status: full  Family Communication: none at bedside Disposition Plan: pending improvement   Consultants:   Neurosurgery  Procedures: (Don't include imaging  studies which can be auto populated. Include things that cannot be auto populated i.e. Echo, Carotid and venous dopplers, Foley, Bipap, HD, tubes/drains, wound vac, central lines etc)  none  Antimicrobials: (specify start and planned stop date. Auto populated tables are space occupying and do not give end dates) Anti-infectives (From admission,  onward)   Start     Dose/Rate Route Frequency Ordered Stop   10/12/17 2315  fluconazole (DIFLUCAN) 40 MG/ML suspension 200 mg     200 mg Oral Daily at bedtime 10/12/17 2302     10/12/17 2200  fluconazole (DIFLUCAN) 40 MG/ML suspension 200 mg  Status:  Discontinued     200 mg Oral Daily at bedtime 10/12/17 2016 10/12/17 2018   10/12/17 2200  fluconazole (DIFLUCAN) tablet 200 mg  Status:  Discontinued     200 mg Oral Daily at bedtime 10/12/17 2018 10/12/17 2302      Subjective: Feeling better.   Didn't tolerate MRI last night.  Doing ok without foley.   Objective: Vitals:   10/15/17 2146 10/16/17 0445 10/16/17 1100 10/16/17 1344  BP: (!) 141/79 134/75 140/74 110/61  Pulse: 79 77 79 73  Resp: 16 14  18   Temp: 98.1 F (36.7 C) 97.8 F (36.6 C)  98.7 F (37.1 C)  TempSrc: Oral Oral  Oral  SpO2: 98% 98%  95%  Weight:      Height:        Intake/Output Summary (Last 24 hours) at 10/16/2017 1548 Last data filed at 10/16/2017 1530 Gross per 24 hour  Intake 675 ml  Output 200 ml  Net 475 ml   Filed Weights   10/12/17 1056  Weight: 62.6 kg (138 lb)    Examination:  General: No acute distress. Cardiovascular: Heart sounds show a regular rate, and rhythm. No gallops or rubs. No murmurs. No JVD. Lungs: Clear to auscultation bilaterally with good air movement. No rales, rhonchi or wheezes. Abdomen: Soft, TTP in RLQ, nondistended with normal active bowel sounds. No masses. No hepatosplenomegaly. Neurological: Alert and oriented 3. Moves all extremities 4 with equal strength. Cranial nerves II through XII grossly intact. Skin: Warm and dry. No rashes or lesions. Extremities: No clubbing or cyanosis. No edema. Pedal pulses 2+. Psychiatric: Mood and affect are normal. Insight and judgment are approrpaite.  Data Reviewed: I have personally reviewed following labs and imaging studies  CBC: Recent Labs  Lab 10/12/17 1217 10/13/17 0615  WBC 7.9 6.9  NEUTROABS 5.8  --   HGB  13.3 12.3  HCT 39.4 37.3  MCV 82.3 82.3  PLT 246 790   Basic Metabolic Panel: Recent Labs  Lab 10/12/17 1217 10/13/17 0615 10/14/17 1044 10/15/17 0509  NA 135 134* 131* 135  K 4.9 4.1 4.5 4.2  CL 99* 102 99* 103  CO2 26 25 20* 25  GLUCOSE 116* 77 116* 87  BUN 19 15 18 17   CREATININE 0.67 0.78 0.86 0.86  CALCIUM 9.1 8.3* 8.3* 8.2*   GFR: Estimated Creatinine Clearance: 55.3 mL/min (by C-G formula based on SCr of 0.86 mg/dL). Liver Function Tests: Recent Labs  Lab 10/12/17 1217  AST 21  ALT 14  ALKPHOS 84  BILITOT 0.2*  PROT 7.4  ALBUMIN 3.8   Recent Labs  Lab 10/12/17 1217  LIPASE 17   No results for input(s): AMMONIA in the last 168 hours. Coagulation Profile: No results for input(s): INR, PROTIME in the last 168 hours. Cardiac Enzymes: No results for input(s): CKTOTAL, CKMB, CKMBINDEX, TROPONINI in the last  168 hours. BNP (last 3 results) No results for input(s): PROBNP in the last 8760 hours. HbA1C: No results for input(s): HGBA1C in the last 72 hours. CBG: No results for input(s): GLUCAP in the last 168 hours. Lipid Profile: No results for input(s): CHOL, HDL, LDLCALC, TRIG, CHOLHDL, LDLDIRECT in the last 72 hours. Thyroid Function Tests: No results for input(s): TSH, T4TOTAL, FREET4, T3FREE, THYROIDAB in the last 72 hours. Anemia Panel: No results for input(s): VITAMINB12, FOLATE, FERRITIN, TIBC, IRON, RETICCTPCT in the last 72 hours. Sepsis Labs: Recent Labs  Lab 10/12/17 1230  LATICACIDVEN 1.29    No results found for this or any previous visit (from the past 240 hour(s)).       Radiology Studies: No results found.      Scheduled Meds: . acetaminophen  1,000 mg Oral Q8H  . calcium-vitamin D  1 tablet Oral Q breakfast  . enoxaparin (LOVENOX) injection  40 mg Subcutaneous Q24H  . famotidine  20 mg Oral BID  . fluconazole  200 mg Oral QHS  . hydrOXYzine  50 mg Oral Q8H  . ibuprofen  800 mg Oral TID  . levothyroxine  50 mcg Oral  QODAY   And  . levothyroxine  75 mcg Oral QODAY  . lidocaine  1 patch Transdermal Q24H  . losartan  100 mg Oral Daily  . magnesium hydroxide  15 mL Oral Once  . morphine  30 mg Oral Q8H  . pantoprazole  40 mg Oral Daily  . predniSONE  4 mg Oral Q breakfast  . senna  1 tablet Oral BID   Continuous Infusions: . sodium chloride 75 mL/hr at 10/15/17 0137     LOS: 2 days    Time spent: over 30 minutes    Fayrene Helper, MD [facility] Pager [phone]  If 7PM-7AM, please contact night-coverage [email or web address] Password [facility] 10/16/2017, 3:48 PM

## 2017-10-17 DIAGNOSIS — E079 Disorder of thyroid, unspecified: Secondary | ICD-10-CM

## 2017-10-17 DIAGNOSIS — E271 Primary adrenocortical insufficiency: Secondary | ICD-10-CM

## 2017-10-17 DIAGNOSIS — R338 Other retention of urine: Secondary | ICD-10-CM

## 2017-10-17 DIAGNOSIS — N390 Urinary tract infection, site not specified: Secondary | ICD-10-CM

## 2017-10-17 DIAGNOSIS — S32010A Wedge compression fracture of first lumbar vertebra, initial encounter for closed fracture: Secondary | ICD-10-CM

## 2017-10-17 DIAGNOSIS — Z853 Personal history of malignant neoplasm of breast: Secondary | ICD-10-CM

## 2017-10-17 DIAGNOSIS — M545 Low back pain: Secondary | ICD-10-CM

## 2017-10-17 LAB — URINALYSIS, ROUTINE W REFLEX MICROSCOPIC
BILIRUBIN URINE: NEGATIVE
Glucose, UA: NEGATIVE mg/dL
KETONES UR: NEGATIVE mg/dL
NITRITE: NEGATIVE
PH: 6 (ref 5.0–8.0)
Protein, ur: 100 mg/dL — AB
SPECIFIC GRAVITY, URINE: 1.027 (ref 1.005–1.030)
Squamous Epithelial / LPF: NONE SEEN

## 2017-10-17 MED ORDER — DEXTROSE 5 % IV SOLN
1.0000 g | INTRAVENOUS | Status: DC
  Administered 2017-10-17 – 2017-10-18 (×2): 1 g via INTRAVENOUS
  Filled 2017-10-17 (×2): qty 10

## 2017-10-17 MED ORDER — PREDNISONE 1 MG PO TABS
4.0000 mg | ORAL_TABLET | Freq: Every day | ORAL | Status: DC
  Administered 2017-10-17 – 2017-10-18 (×2): 4 mg via ORAL
  Filled 2017-10-17 (×2): qty 4

## 2017-10-17 MED ORDER — OXYCODONE HCL 5 MG PO TABS
5.0000 mg | ORAL_TABLET | ORAL | Status: DC | PRN
  Administered 2017-10-17 – 2017-10-18 (×4): 5 mg via ORAL
  Filled 2017-10-17 (×4): qty 1

## 2017-10-17 NOTE — Progress Notes (Addendum)
PROGRESS NOTE    Daisy [name]  FYB:[id number] DOB: 08-Nov-1951 DOA: 10/12/2017 PCP: Lawerance Cruel, MD  Brief Narrative:  Daisy [name] is a 66 y.o. female with medical history significant of MS, addisons disease, chronic back pain with L1 compression fracture with nerve stimulator, history of breast cancer s/p radiation/chemo/surgery, and multiple other medical problems presents with worsening back pain over the past 9 days.   The patient notes that since the manometry last week she's had worsening pain. She notes that during the manometry they had her in several different positions which she takes aggravated her pain. Since then she's had pain to the extent that it's difficult to walk or sit up and movement is excruciating. She notes that she hasn't been up since yesterday due to the pain. She describes the pain as being sharp and burning. It radiates down both legs. The pain is worse on the right side. She notes numbness in her feet and legs is more chronic. She denies any incontinence of stool. She does note that she has a hard time emptying her bladder. This is a chronic problem, but it seems a little worse.  She notes sensitivity to touch. She first describes saddle anesthesia, but on exam she did not have any. She follows with Dr. Ellene Route from neurosurgery.  Assessment & Plan:   Active Problems:   Addison's disease ([place])   Thyroid disease   History of left breast cancer   Back pain   Degenerative disc disease, lumbar   Compression fracture of first lumbar vertebra (HCC)   GERD (gastroesophageal reflux disease)   Chronic Back Pain with chronic C1 compression fracture with exacerbation  Degenerative disc disease  Neurosurgery Dr Ellene Route consulted and recommended  MRI of lumbar spine to r/o subacute fracture.  MRI reviews multiple chronic findings but no acute findings when compared to the one in 05/2017 Pain control with home dose morphine, asap, lidocaine patch, NSAIDS and  muscle relexer, she is allergic to gabapentin, will discuss with her about lyrica or cymbalta,  she declined trial of oral steroids. Hydroxyzine for nausea (notes this is all that works). Zofran as well.  PT/OT recommending home health PT, rolling walker with 5 inch wheels, 3 in 1.  Overall seems to be improving.   Abdominal Pain: report has much improved, referred pain vs from urinary retention/uti? CT ab /pel no acute findings Urinary retention: Foley removed.  No longer retaining.  On abx , urine culture obtained after starting abx  Hypothyroidism: continue synthroid  Candidal esophagitis: finish fluconazole (about 7 days into 2 week course at presentation - last dose is 11/14)  GERD: continue PPI and H2 blocker  Addisons: continue prednisone  MS: not on any medications for this  History of breast cancer: s/p chemo/rad/surgery  Soft diet for esophageal dysmotility  DVT prophylaxis: lovenox Code Status: full  Family Communication: none at bedside Disposition Plan: home with home health on 11/5   Consultants:   Neurosurgery  Procedures: (Don't include imaging studies which can be auto populated. Include things that cannot be auto populated i.e. Echo, Carotid and venous dopplers, Foley, Bipap, HD, tubes/drains, wound vac, central lines etc)  none  Antimicrobials: (specify start and planned stop date. Auto populated tables are space occupying and do not give end dates) Anti-infectives (From admission, onward)   Start     Dose/Rate Route Frequency Ordered Stop   10/17/17 0500  cefTRIAXone (ROCEPHIN) 1 g in dextrose 5 % 50 mL IVPB  1 g 100 mL/hr over 30 Minutes Intravenous Every 24 hours 10/17/17 0402     10/12/17 2315  fluconazole (DIFLUCAN) 40 MG/ML suspension 200 mg     200 mg Oral Daily at bedtime 10/12/17 2302 10/18/17 1557   10/12/17 2200  fluconazole (DIFLUCAN) 40 MG/ML suspension 200 mg  Status:  Discontinued     200 mg Oral Daily at bedtime 10/12/17 2016  10/12/17 2018   10/12/17 2200  fluconazole (DIFLUCAN) tablet 200 mg  Status:  Discontinued     200 mg Oral Daily at bedtime 10/12/17 2018 10/12/17 2302      Subjective: Abdominal pain has resolved,  Right sided back Pain tolerable at rest, but still unbearable with movement,  anxious, urine cloudy.   Objective: Vitals:   10/16/17 1344 10/16/17 2210 10/17/17 0445 10/17/17 0929  BP: 110/61 (!) 158/70 (!) 145/79 (!) 170/73  Pulse: 73 71 72 79  Resp: 18 17 18 20   Temp: 98.7 F (37.1 C) 97.9 F (36.6 C) 98.3 F (36.8 C)   TempSrc: Oral Oral Oral   SpO2: 95% 100% 99% 99%  Weight:      Height:        Intake/Output Summary (Last 24 hours) at 10/17/2017 0957 Last data filed at 10/17/2017 0458 Gross per 24 hour  Intake 800 ml  Output 200 ml  Net 600 ml   Filed Weights   10/12/17 1056  Weight: 62.6 kg (138 lb)    Examination:  General: No acute distress. Cardiovascular: Heart sounds show a regular rate, and rhythm. No gallops or rubs. No murmurs. No JVD. Lungs: Clear to auscultation bilaterally with good air movement. No rales, rhonchi or wheezes. Abdomen: Soft, TTP in RLQ, nondistended with normal active bowel sounds. No masses. No hepatosplenomegaly. Neurological: Alert and oriented 3. Moves all extremities 4 with equal strength. Cranial nerves II through XII grossly intact. Skin: Warm and dry. No rashes or lesions. Extremities: No clubbing or cyanosis. No edema. Pedal pulses 2+. Psychiatric: Mood and affect are normal. Insight and judgment are approrpaite.  Data Reviewed: I have personally reviewed following labs and imaging studies  CBC: Recent Labs  Lab 10/12/17 1217 10/13/17 0615  WBC 7.9 6.9  NEUTROABS 5.8  --   HGB 13.3 12.3  HCT 39.4 37.3  MCV 82.3 82.3  PLT 246 144   Basic Metabolic Panel: Recent Labs  Lab 10/12/17 1217 10/13/17 0615 10/14/17 1044 10/15/17 0509  NA 135 134* 131* 135  K 4.9 4.1 4.5 4.2  CL 99* 102 99* 103  CO2 26 25 20* 25    GLUCOSE 116* 77 116* 87  BUN 19 15 18 17   CREATININE 0.67 0.78 0.86 0.86  CALCIUM 9.1 8.3* 8.3* 8.2*   GFR: Estimated Creatinine Clearance: 55.3 mL/min (by C-G formula based on SCr of 0.86 mg/dL). Liver Function Tests: Recent Labs  Lab 10/12/17 1217  AST 21  ALT 14  ALKPHOS 84  BILITOT 0.2*  PROT 7.4  ALBUMIN 3.8   Recent Labs  Lab 10/12/17 1217  LIPASE 17   No results for input(s): AMMONIA in the last 168 hours. Coagulation Profile: No results for input(s): INR, PROTIME in the last 168 hours. Cardiac Enzymes: No results for input(s): CKTOTAL, CKMB, CKMBINDEX, TROPONINI in the last 168 hours. BNP (last 3 results) No results for input(s): PROBNP in the last 8760 hours. HbA1C: No results for input(s): HGBA1C in the last 72 hours. CBG: No results for input(s): GLUCAP in the last 168 hours. Lipid Profile: No  results for input(s): CHOL, HDL, LDLCALC, TRIG, CHOLHDL, LDLDIRECT in the last 72 hours. Thyroid Function Tests: No results for input(s): TSH, T4TOTAL, FREET4, T3FREE, THYROIDAB in the last 72 hours. Anemia Panel: No results for input(s): VITAMINB12, FOLATE, FERRITIN, TIBC, IRON, RETICCTPCT in the last 72 hours. Sepsis Labs: Recent Labs  Lab 10/12/17 1230  LATICACIDVEN 1.29    No results found for this or any previous visit (from the past 240 hour(s)).       Radiology Studies: Mr Lumbar Spine Wo Contrast  Result Date: 10/16/2017 CLINICAL DATA:  Low back pain with lower extremity weakness EXAM: MRI LUMBAR SPINE WITHOUT CONTRAST TECHNIQUE: Multiplanar, multisequence MR imaging of the lumbar spine was performed. No intravenous contrast was administered. COMPARISON:  Lumbar spine MRI 05/31/2017 FINDINGS: Segmentation:  Standard. Alignment:  There is levoscoliosis with apex at L2-L3. Vertebrae:  Unchanged L1 compression deformity. Conus medullaris: Extends to the L2 level and appears normal. Paraspinal and other soft tissues: Persistently dilated common bile  duct. Disc levels: Above the T12 level, there is no disc herniation, spinal canal stenosis or neuroforaminal stenosis. T12-L1: Small right subarticular disc protrusion without stenosis. Unchanged. L1-L2: Normal disc space and facets. No spinal canal or neuroforaminal stenosis. Unchanged. L2-L3: Right subarticular disc protrusion narrows the right lateral recess. Mild right foraminal stenosis. Unchanged. L3-L4: Severe disc space loss with minimal right eccentric disc osteophyte complex. There is mild narrowing of the right lateral recess. Mild right foraminal stenosis. Unchanged. L4-L5: Severe disc space loss. Mild diffuse bulge and mild facet hypertrophy. No central spinal canal stenosis. Mild to moderate bilateral neural foraminal stenosis. Findings at this level are unchanged. L5-S1: Normal disc space and facets. No spinal canal or neuroforaminal stenosis. Visualized sacrum: Normal. IMPRESSION: 1. Unchanged levoscoliosis of the lumbar spine with apex at L2-L3. 2. Unchanged chronic compression deformity of L1. 3. Unchanged multilevel mild-to-moderate neural foraminal stenosis. 4. Unchanged right lateral recess narrowing at L2-L3. Correlate for right L3 radiculopathy. Electronically Signed   By: Ulyses Jarred M.D.   On: 10/16/2017 18:56        Scheduled Meds: . acetaminophen  1,000 mg Oral Q8H  . calcium-vitamin D  1 tablet Oral Q breakfast  . enoxaparin (LOVENOX) injection  40 mg Subcutaneous Q24H  . famotidine  20 mg Oral BID  . fluconazole  200 mg Oral QHS  . hydrOXYzine  50 mg Oral Q8H  . ibuprofen  800 mg Oral TID  . levothyroxine  50 mcg Oral QODAY   And  . levothyroxine  75 mcg Oral QODAY  . lidocaine  1 patch Transdermal Q24H  . losartan  100 mg Oral Daily  . magnesium hydroxide  15 mL Oral Once  . morphine  30 mg Oral Q8H  . pantoprazole  40 mg Oral Daily  . predniSONE  4 mg Oral Q breakfast  . senna  1 tablet Oral BID   Continuous Infusions: . cefTRIAXone (ROCEPHIN)  IV Stopped  (10/17/17 0619)     LOS: 3 days    Time spent: over 30 minutes, more than 50% time spent on patient's education, discuss plan of care.    Florencia Reasons, MD PhD [facility] Pager [phone]  If 7PM-7AM, please contact night-coverage [email or web address] Password [email or web address] 10/17/2017, 9:57 AM

## 2017-10-17 NOTE — Progress Notes (Signed)
PHARMACY NOTE -  Rocephin  Pharmacy has been consulted for dosing of Rocephin for UTI. Rocephin 1gm IV q24h ordered.  Need for further dosage adjustment appears unlikely at present.    Pharmacy will peripherally follow for any positive cx data via electronic surveillance software.   Will sign off at this time.  Please reconsult if a change in clinical status warrants re-evaluation of dosage.  Netta Cedars, PharmD, BCPS 10/17/2017@4 :04 AM

## 2017-10-18 LAB — URINE CULTURE: Culture: <10000 — AB

## 2017-10-18 MED ORDER — CEPHALEXIN 500 MG PO CAPS
500.0000 mg | ORAL_CAPSULE | Freq: Two times a day (BID) | ORAL | Status: DC
  Administered 2017-10-18: 500 mg via ORAL
  Filled 2017-10-18: qty 1

## 2017-10-18 MED ORDER — OXYCODONE HCL 5 MG PO TABS: 5.0000 mg | ORAL_TABLET | ORAL | 0 refills | Status: DC | PRN

## 2017-10-18 MED ORDER — POLYETHYLENE GLYCOL 3350 17 G PO PACK
17.0000 g | PACK | Freq: Every day | ORAL | Status: DC
  Filled 2017-10-18: qty 1

## 2017-10-18 MED ORDER — SENNOSIDES-DOCUSATE SODIUM 8.6-50 MG PO TABS: 1.0000 | ORAL_TABLET | Freq: Two times a day (BID) | ORAL | 0 refills | Status: DC

## 2017-10-18 MED ORDER — CEPHALEXIN 500 MG PO CAPS: 500.0000 mg | ORAL_CAPSULE | Freq: Two times a day (BID) | ORAL | 0 refills | Status: AC

## 2017-10-18 MED ORDER — POLYETHYLENE GLYCOL 3350 17 G PO PACK: 17.0000 g | PACK | Freq: Every day | ORAL | 0 refills | Status: DC

## 2017-10-18 MED ORDER — LIDOCAINE 5 % EX PTCH: 1.0000 | MEDICATED_PATCH | CUTANEOUS | 0 refills | Status: DC

## 2017-10-18 MED ORDER — IBUPROFEN 800 MG PO TABS: 800.0000 mg | ORAL_TABLET | Freq: Three times a day (TID) | ORAL | 0 refills | Status: DC | PRN

## 2017-10-18 NOTE — Discharge Summary (Signed)
Discharge Summary  DIONISIA PACHOLSKI NKN:[id number] DOB: 11-Feb-1951  PCP: Lawerance Cruel, MD  Admit date: 10/12/2017 Discharge date: 10/18/2017  Time spent: <81mins  Recommendations for Outpatient Follow-up:  1. F/u with PMD within a week  for hospital discharge follow up, repeat cbc/bmp at follow up 2. F/u with neurospine surgery Dr Ellene Route 3. Home health /DME ordered  Discharge Diagnoses:  Active Hospital Problems   Diagnosis Date Noted  . Back pain 10/12/2017  . Degenerative disc disease, lumbar 10/12/2017  . Compression fracture of first lumbar vertebra ([place]) 10/12/2017  . GERD (gastroesophageal reflux disease) 10/12/2017  . History of left breast cancer 09/25/2014  . Addison's disease ([place])   . Thyroid disease     Resolved Hospital Problems  No resolved problems to display.    Discharge Condition: stable  Diet recommendation: regular diet  Filed Weights   10/12/17 1056  Weight: 62.6 kg (138 lb)    History of present illness:  Chief Complaint: back pain  HPI: Daisy [name] is a 66 y.o. female with medical history significant of MS, addisons disease, chronic back pain with L1 compression fracture with nerve stimulator, history of breast cancer s/p radiation/chemo/surgery, and multiple other medical problems presents with worsening back pain over the past 9 days.   The patient notes that since the manometry last week she's had worsening pain. She notes that during the manometry they had her in several different positions which she takes aggravated her pain. Since then she's had pain to the extent that it's difficult to walk or sit up and movement is excruciating. She notes that she hasn't been up since yesterday due to the pain. She describes the pain as being sharp and burning. It radiates down both legs. The pain is worse on the right side. She notes numbness in her feet and legs is more chronic. She denies any incontinence of stool. She does note that she has a  hard time emptying her bladder. This is a chronic problem, but it seems a little worse.  She notes sensitivity to touch. She first describes saddle anesthesia, but on exam she did not have any. She follows with Dr. Ellene Route from neurosurgery.  ED Course: In the emergency department she had labs and imaging. Pain control was attempted with intravenous morphine and Robaxin, but her pain was not controlled. After discussion with neurosurgery plan for admission and observation and attempting pain control.    Hospital Course:  Active Problems:   Addison's disease ([place])   Thyroid disease   History of left breast cancer   Back pain   Degenerative disc disease, lumbar   Compression fracture of first lumbar vertebra ([facility])   GERD (gastroesophageal reflux disease)  Chronic Back Pain with chronic C1 compression fracture with exacerbation  Degenerative disc disease  -Neurosurgery Dr Ellene Route consulted and recommended  MRI of lumbar spine to r/o subacute fracture.  MRI reviews multiple chronic findings but no acute findings when compared to the one in 05/2017 -Pain control with home dose morphine, asap, lidocaine patch, NSAIDS and muscle relexer, she is allergic to gabapentin,  Can continue to discuss with her about lyrica or cymbalta if pain persists, she currently want to limit meds,  she declined trial of oral steroids. -continue Hydroxyzine for nausea (notes this is all that works). Zofran as well.  -PT/OT recommending home health, rolling walker with 5 inch wheels, 3 in 1.  - improving, home with home health.  Abdominal Pain: report has much improved, referred pain  vs from urinary retention/uti? -CT ab /pel no acute findings Urinary retention:has been chronic and intermittent for the last year, Foley removed.  No longer retaining.   She is started on rocephin due to report urinary symptoms , urine culture obtained after starting abx less than 10,000 colonies. She is discharged on oral keflex for  another three days to finish total of 5 days treatment. Prevent constipation.  Hypothyroidism: continue synthroid  Candidal esophagitis:finish fluconazole (about 7 days into 2 week course at presentation - last dose is 11/14), continue outpatient follow up with gi.  GERD: continue PPI and H2 blocker  Addisons: continue prednisone  MS: not on any medications for this  History of breast cancer:s/p chemo/rad/surgery  Soft diet for esophageal dysmotility    DVT prophylaxis while in the hosptial: lovenox Code Status: full  Family Communication: none at bedside Disposition Plan: home with home health on 11/15   Consultants:   Neurosurgery Dr Ellene Route  Procedures: (Don't include imaging studies which can be auto populated. Include things that cannot be auto populated i.e. Echo, Carotid and venous dopplers, Foley, Bipap, HD, tubes/drains, wound vac, central lines etc)  none  Antimicrobials: (specify start and planned stop date. Auto populated tables are space occupying and do not give end dates)            Anti-infectives (From admission, onward)     Start        Dose/Rate  Route  Frequency  Ordered  Stop     10/17/17 0500    cefTRIAXone (ROCEPHIN) 1 g in dextrose 5 % 50 mL IVPB        1 g  100 mL/hr over 30 Minutes  Intravenous  Every 24 hours  10/17/17 0402        10/12/17 2315    fluconazole (DIFLUCAN) 40 MG/ML suspension 200 mg        200 mg  Oral  Daily at bedtime  10/12/17 2302  10/18/17 1557     10/12/17 2200    fluconazole (DIFLUCAN) 40 MG/ML suspension 200 mg  Status:  Discontinued        200 mg  Oral  Daily at bedtime  10/12/17 2016  10/12/17 2018     10/12/17 2200    fluconazole (DIFLUCAN) tablet 200 mg  Status:  Discontinued        200 mg  Oral  Daily at bedtime  10/12/17 2018  10/12/17 2302          Discharge Exam: BP 122/64 (BP Location: Right Arm)   Pulse 73   Temp 97.8 F  (36.6 C) (Oral)   Resp 18   Ht 5\' 1"  (1.549 m)   Wt 62.6 kg (138 lb)   SpO2 100%   BMI 26.07 kg/m   General: NAD Cardiovascular: RRR Respiratory: CTABL  Discharge Instructions You were cared for by a hospitalist during your hospital stay. If you have any questions about your discharge medications or the care you received while you were in the hospital after you are discharged, you can call the unit and asked to speak with the hospitalist on call if the hospitalist that took care of you is not available. Once you are discharged, your primary care physician will handle any further medical issues. Please note that NO REFILLS for any discharge medications will be authorized once you are discharged, as it is imperative that you return to your primary care physician (or establish a relationship with a primary care physician if you do  not have one) for your aftercare needs so that they can reassess your need for medications and monitor your lab values.  Discharge Instructions    Diet general   Complete by:  As directed    Increase activity slowly   Complete by:  As directed      Allergies as of 10/18/2017      Reactions   Fosamax [alendronate] Other (See Comments)   ESOPHAGEAL IRRITATION DANGER OF ESOPHAGEAL PERF.   Gabapentin Anaphylaxis   Iodine-131 Anaphylaxis   Iohexol Anaphylaxis   ANAPHYLACTIC REACTION!-kmr-10/16/05   Ivp Dye [iodinated Diagnostic Agents] Anaphylaxis, Other (See Comments)   Fever and blood clot   Sulfa Antibiotics Swelling   SWELLING REACTION UNSPECIFIED  HALLUCINATIONS   Terbinafine Hcl Anaphylaxis, Other (See Comments)   THROAT CLOSED UP   Ambien [zolpidem] Nausea And Vomiting, Other (See Comments)   SHAKEY   Bactrim [sulfamethoxazole-trimethoprim] Other (See Comments)   ALTERED MENTAL STATUS HALLUCINATIONS    Compazine Other (See Comments)   Hyper and shaky   Dilaudid [hydromorphone Hcl] Nausea And Vomiting, Other (See Comments)   HEADACHES    Guaifenesin Nausea And Vomiting, Swelling, Other (See Comments)   SWELLING REACTION UNSPECIFIED  HYPERACTIVE   Hydromorphone Nausea And Vomiting, Other (See Comments)   HEADACHE >> MIGRAINE   Lisinopril Cough   Metformin And Related    Migraine   Methocarbamol Other (See Comments)   "Makes pt feel irritable "   Metoclopramide Other (See Comments)   HYPERACTIVITY   Metoclopramide Hcl Other (See Comments)   Hyper, shaky   Promethazine Anxiety, Other (See Comments)   DIZZINESS   Vancomycin Swelling   SWELLING REACTION UNSPECIFIED    Aldara [imiquimod] Other (See Comments)   "Active and sick "   Amlodipine Swelling   SWELLING REACTION UNSPECIFIED    Clindamycin/lincomycin Other (See Comments)   UNSPECIFIED REACTION    Trifluoperazine    Hallucination   Augmentin [amoxicillin-pot Clavulanate] Diarrhea, Other (See Comments)   Bad headaches Has patient had a PCN reaction causing immediate rash, facial/tongue/throat swelling, SOB or lightheadedness with hypotension: no Has patient had a PCN reaction causing severe rash involving mucus membranes or skin necrosis: no Has patient had a PCN reaction that required hospitalization no Has patient had a PCN reaction occurring within the last 10 years: no If all of the above answers are "NO", then may proceed with Cephalosporin use.   Biaxin [clarithromycin] Anxiety, Other (See Comments)   jittery   Carafate [sucralfate] Nausea Only   Distends stomach.    Guaifenesin Anxiety, Other (See Comments)   Headache   Other Hives   SOME TAPES CAUSE SKIN IRRITATION   Prochlorperazine Anxiety      Medication List    STOP taking these medications   fluconazole 40 MG/ML suspension Commonly known as:  DIFLUCAN   nystatin powder Generic drug:  nystatin   senna 8.6 MG tablet Commonly known as:  SENOKOT     TAKE these medications   cephALEXin 500 MG capsule Commonly known as:  KEFLEX Take 1 capsule (500 mg total) every 12 (twelve) hours for  3 days by mouth.   DEXILANT 60 MG capsule Generic drug:  dexlansoprazole Take 60 mg by mouth daily.   diphenhydrAMINE 25 MG tablet Commonly known as:  BENADRYL Take 50 mg by mouth every 6 (six) hours as needed for itching or allergies.   hydrOXYzine 25 MG tablet Commonly known as:  ATARAX/VISTARIL Take 50 mg every 8 (eight) hours by mouth.  ibuprofen 800 MG tablet Commonly known as:  ADVIL,MOTRIN Take 1 tablet (800 mg total) every 8 (eight) hours as needed by mouth.   levothyroxine 50 MCG tablet Commonly known as:  SYNTHROID, LEVOTHROID Take 50 mcg by mouth every other day. Alternate with the 75mg  tablet always take the 57mcg on even days per spouse   levothyroxine 75 MCG tablet Commonly known as:  SYNTHROID, LEVOTHROID Take 75 mcg by mouth every other day.   lidocaine 5 % Commonly known as:  LIDODERM Place 1 patch daily onto the skin. Remove & Discard patch within 12 hours or as directed by MD   losartan 100 MG tablet Commonly known as:  COZAAR Take 100 mg daily by mouth.   morphine 30 MG tablet Commonly known as:  MSIR Take 30 mg every 8 (eight) hours by mouth. TAKES AT 1 AM AND 1 PM   ondansetron 4 MG disintegrating tablet Commonly known as:  ZOFRAN ODT Take 1 tablet (4 mg total) by mouth every 6 (six) hours as needed for nausea or vomiting.   oxyCODONE 5 MG immediate release tablet Commonly known as:  Oxy IR/ROXICODONE Take 1 tablet (5 mg total) every 3 (three) hours as needed by mouth for moderate pain or severe pain.   polyethylene glycol packet Commonly known as:  MIRALAX / GLYCOLAX Take 17 g daily by mouth.   predniSONE 1 MG tablet Commonly known as:  DELTASONE Take 4 mg by mouth daily with breakfast.   ranitidine 150 MG tablet Commonly known as:  ZANTAC Take 150 mg 2 (two) times daily by mouth.   senna-docusate 8.6-50 MG tablet Commonly known as:  Senokot-S Take 1 tablet 2 (two) times daily by mouth.   sucralfate 1 GM/10ML suspension Commonly  known as:  CARAFATE Take 1 g daily as needed by mouth (nausea).   tiZANidine 2 MG tablet Commonly known as:  ZANAFLEX Take 1-2 tablets 3 (three) times daily as needed by mouth for pain.   valACYclovir 1000 MG tablet Commonly known as:  VALTREX Take 1,000 mg 3 (three) times daily as needed by mouth (shingles).   VIACTIV 660-630-16 MG-UNT-MCG Chew Generic drug:  Calcium-Vitamin D-Vitamin K Chew 3 each by mouth daily at 12 noon.            Durable Medical Equipment  (From admission, onward)        Start     Ordered   10/15/17 1525  For home use only DME Walker rolling  Once    Question:  Patient needs a walker to treat with the following condition  Answer:  Weakness generalized   10/15/17 1525   10/15/17 1525  For home use only DME 3 n 1  Once     10/15/17 1525     Allergies  Allergen Reactions  . Fosamax [Alendronate] Other (See Comments)    ESOPHAGEAL IRRITATION DANGER OF ESOPHAGEAL PERF.  . Gabapentin Anaphylaxis  . Iodine-131 Anaphylaxis  . Iohexol Anaphylaxis    ANAPHYLACTIC REACTION!-kmr-10/16/05   . Ivp Dye [Iodinated Diagnostic Agents] Anaphylaxis and Other (See Comments)    Fever and blood clot  . Sulfa Antibiotics Swelling    SWELLING REACTION UNSPECIFIED  HALLUCINATIONS   . Terbinafine Hcl Anaphylaxis and Other (See Comments)    THROAT CLOSED UP  . Ambien [Zolpidem] Nausea And Vomiting and Other (See Comments)    SHAKEY  . Bactrim [Sulfamethoxazole-Trimethoprim] Other (See Comments)    ALTERED MENTAL STATUS HALLUCINATIONS   . Compazine Other (See Comments)  Hyper and shaky  . Dilaudid [Hydromorphone Hcl] Nausea And Vomiting and Other (See Comments)    HEADACHES  . Guaifenesin Nausea And Vomiting, Swelling and Other (See Comments)    SWELLING REACTION UNSPECIFIED  HYPERACTIVE  . Hydromorphone Nausea And Vomiting and Other (See Comments)    HEADACHE >> MIGRAINE  . Lisinopril Cough  . Metformin And Related     Migraine   . Methocarbamol  Other (See Comments)    "Makes pt feel irritable "  . Metoclopramide Other (See Comments)    HYPERACTIVITY   . Metoclopramide Hcl Other (See Comments)    Hyper, shaky  . Promethazine Anxiety and Other (See Comments)    DIZZINESS  . Vancomycin Swelling    SWELLING REACTION UNSPECIFIED   . Aldara [Imiquimod] Other (See Comments)    "Active and sick "  . Amlodipine Swelling    SWELLING REACTION UNSPECIFIED   . Clindamycin/Lincomycin Other (See Comments)    UNSPECIFIED REACTION   . Trifluoperazine     Hallucination   . Augmentin [Amoxicillin-Pot Clavulanate] Diarrhea and Other (See Comments)    Bad headaches Has patient had a PCN reaction causing immediate rash, facial/tongue/throat swelling, SOB or lightheadedness with hypotension: no Has patient had a PCN reaction causing severe rash involving mucus membranes or skin necrosis: no Has patient had a PCN reaction that required hospitalization no Has patient had a PCN reaction occurring within the last 10 years: no If all of the above answers are "NO", then may proceed with Cephalosporin use.   . Biaxin [Clarithromycin] Anxiety and Other (See Comments)    jittery  . Carafate [Sucralfate] Nausea Only    Distends stomach.   . Guaifenesin Anxiety and Other (See Comments)    Headache  . Other Hives    SOME TAPES CAUSE SKIN IRRITATION  . Prochlorperazine Anxiety   Follow-up Information    Lawerance Cruel, MD Follow up in 1 week(s).   Specialty:  Family Medicine Why:  hospital discharge follow up Contact information: [address] [place] [address] [phone]        Kristeen Miss, MD Follow up.   Specialty:  Neurosurgery Why:  as needed Contact information: [address] [place] [address] [phone]            The results of significant diagnostics from this hospitalization (including imaging, microbiology, ancillary and laboratory) are listed below for reference.     Significant Diagnostic Studies: Ct Abdomen Pelvis Wo Contrast  Result Date: 10/12/2017 CLINICAL DATA:  Abdominal pain. Nausea and vomiting. History of breast cancer. EXAM: CT ABDOMEN AND PELVIS WITHOUT CONTRAST TECHNIQUE: Multidetector CT imaging of the abdomen and pelvis was performed following the standard protocol without IV contrast. COMPARISON:  CT chest 03/22/2015. FINDINGS: Lower chest: Bronchiectasis identified in the right middle lobe. Calcified granuloma in the posterior right lower lobe. Hepatobiliary: Previous cholecystectomy. The common bile duct measures 1.6 cm in diameter. No focal liver abnormality. Pancreas: The pancreas is unremarkable. Spleen: Calcified granulomas within the spleen. Adrenals/Urinary Tract: The adrenal glands are normal. Bilateral nephrolithiasis. Stones measure up to 4 mm. No kidney mass or hydronephrosis identified. Urinary bladder appears normal. Stomach/Bowel: Non distended stomach. No dilated loops of small bowel. A moderate stool burden identified within the colon. Vascular/Lymphatic: Mild aortic atherosclerosis. No aneurysm. No adenopathy within the abdomen or pelvis. Reproductive: Status post hysterectomy. No adnexal masses. Other: No abdominal wall hernia or abnormality. No abdominopelvic ascites. Musculoskeletal: Spinal cord stimulator is identified within the  soft tissues overlying the posterior right side of the pelvis. There is a scoliosis deformity involving the lumbar spine. Degenerative disc disease noted. Most advanced at L4-5. There is a compression fracture involving the L1 vertebra, unchanged from 03/22/2015. IMPRESSION: 1. No acute findings identified. 2. Bilateral nonobstructing renal calculi. 3. Mild Aortic Atherosclerosis (ICD10-I70.0). 4. Chronic L1 compression fracture. 5. Scoliosis and degenerative disc disease 6. Previous cholecystectomy with increase caliber of the common bile duct measuring 1.6 cm. Electronically Signed   By: Kerby Moors M.D.    On: 10/12/2017 12:21   Mr Lumbar Spine Wo Contrast  Result Date: 10/16/2017 CLINICAL DATA:  Low back pain with lower extremity weakness EXAM: MRI LUMBAR SPINE WITHOUT CONTRAST TECHNIQUE: Multiplanar, multisequence MR imaging of the lumbar spine was performed. No intravenous contrast was administered. COMPARISON:  Lumbar spine MRI 05/31/2017 FINDINGS: Segmentation:  Standard. Alignment:  There is levoscoliosis with apex at L2-L3. Vertebrae:  Unchanged L1 compression deformity. Conus medullaris: Extends to the L2 level and appears normal. Paraspinal and other soft tissues: Persistently dilated common bile duct. Disc levels: Above the T12 level, there is no disc herniation, spinal canal stenosis or neuroforaminal stenosis. T12-L1: Small right subarticular disc protrusion without stenosis. Unchanged. L1-L2: Normal disc space and facets. No spinal canal or neuroforaminal stenosis. Unchanged. L2-L3: Right subarticular disc protrusion narrows the right lateral recess. Mild right foraminal stenosis. Unchanged. L3-L4: Severe disc space loss with minimal right eccentric disc osteophyte complex. There is mild narrowing of the right lateral recess. Mild right foraminal stenosis. Unchanged. L4-L5: Severe disc space loss. Mild diffuse bulge and mild facet hypertrophy. No central spinal canal stenosis. Mild to moderate bilateral neural foraminal stenosis. Findings at this level are unchanged. L5-S1: Normal disc space and facets. No spinal canal or neuroforaminal stenosis. Visualized sacrum: Normal. IMPRESSION: 1. Unchanged levoscoliosis of the lumbar spine with apex at L2-L3. 2. Unchanged chronic compression deformity of L1. 3. Unchanged multilevel mild-to-moderate neural foraminal stenosis. 4. Unchanged right lateral recess narrowing at L2-L3. Correlate for right L3 radiculopathy. Electronically Signed   By: Ulyses Jarred M.D.   On: 10/16/2017 18:56    Microbiology: Recent Results (from the past 240 hour(s))  Culture,  Urine     Status: Abnormal   Collection Time: 10/17/17 10:18 AM  Result Value Ref Range Status   Specimen Description URINE, RANDOM  Final   Special Requests NONE  Final   Culture (A)  Final    <10,000 COLONIES/mL Performed at [facility], [place] [address] [place], [place] [address]    Report Status 10/18/2017 FINAL  Final     Labs: Basic Metabolic Panel: Recent Labs  Lab 10/12/17 1217 10/13/17 0615 10/14/17 1044 10/15/17 0509  NA 135 134* 131* 135  K 4.9 4.1 4.5 4.2  CL 99* 102 99* 103  CO2 26 25 20* 25  GLUCOSE 116* 77 116* 87  BUN 19 15 18 17   CREATININE 0.67 0.78 0.86 0.86  CALCIUM 9.1 8.3* 8.3* 8.2*   Liver Function Tests: Recent Labs  Lab 10/12/17 1217  AST 21  ALT 14  ALKPHOS 84  BILITOT 0.2*  PROT 7.4  ALBUMIN 3.8   Recent Labs  Lab 10/12/17 1217  LIPASE 17   No results for input(s): AMMONIA in the last 168 hours. CBC: Recent Labs  Lab 10/12/17 1217 10/13/17 0615  WBC 7.9 6.9  NEUTROABS 5.8  --   HGB 13.3 12.3  HCT 39.4 37.3  MCV 82.3 82.3  PLT 246 225  Cardiac Enzymes: No results for input(s): CKTOTAL, CKMB, CKMBINDEX, TROPONINI in the last 168 hours. BNP: BNP (last 3 results) No results for input(s): BNP in the last 8760 hours.  ProBNP (last 3 results) No results for input(s): PROBNP in the last 8760 hours.  CBG: No results for input(s): GLUCAP in the last 168 hours.     SignedFlorencia Reasons MD, PhD  [facility] 10/18/2017, 10:27 AM

## 2017-10-18 NOTE — Progress Notes (Signed)
    Durable Medical Equipment  (From admission, onward)        Start     Ordered   10/18/17 1134  For home use only DME Cane  Once     10/18/17 1133   10/15/17 1525  For home use only DME Walker rolling  Once    Question:  Patient needs a walker to treat with the following condition  Answer:  Weakness generalized   10/15/17 1525   10/15/17 1525  For home use only DME 3 n 1  Once     10/15/17 1525    [phone]

## 2017-10-18 NOTE — Progress Notes (Signed)
MRI was reviewed and discussed with patient by phone yesterday. MRI demonstrates that there are no acute findings though she has advanced degenerative scoliosis and an old healed L1 fracture. She does have some right-sided pathology at L2-3. I do not feel that an acute intervention is warranted. Continue with pain management regimens. She expressed to me that in the past steroid injections have not helped her and does aggravate her Addison's disease control.

## 2017-10-18 NOTE — Progress Notes (Signed)
Physical Therapy Treatment Patient Details Name: Daisy [name] MRN: [id number] DOB: 24-Dec-1950 Today's Date: 10/18/2017    History of Present Illness Daisy [name] is a 66 y.o. female with medical history significant of MS, addisons disease, chronic back pain with L1 compression fracture with nerve stimulator, history of breast cancer s/p radiation/chemo/surgery, and multiple other medical problems presents with worsening back pain over the past 9 days.     PT Comments    Orthostatics taken: Supine: BP 158/72 , HR 84, O2 100 Sitting: BP 165/72, HR 87, O2 100 Standing BP 167/88, HR 97, O2 100  Ambulated 150 feet with min guard, requiring VC's for posture and short rest breaks. Performed 4 steps with SPC and R hand rail, requiring VC's for sequencing. No complaints of dizziness throughout session. Positioned in recliner with heat on low back.   Follow Up Recommendations  Home health PT     Equipment Recommendations  Rolling walker with 5" wheels;3in1 (PT)    Recommendations for Other Services       Precautions / Restrictions Precautions Precautions: Back;Fall Restrictions Weight Bearing Restrictions: No    Mobility  Bed Mobility Overal bed mobility: Needs Assistance Bed Mobility: Sidelying to Sit Rolling: Supervision Sidelying to sit: Supervision        Transfers Overall transfer level: Needs assistance Equipment used: Rolling walker (2 wheeled) Transfers: Sit to/from Stand Sit to Stand: Supervision;Min guard Stand pivot transfers: Min assist       General transfer comment: 25% VC's for safe hand placement  Ambulation/Gait Ambulation/Gait assistance: Min guard Ambulation Distance (Feet): 150 Feet Assistive device: Rolling walker (2 wheeled) Gait Pattern/deviations: Step-through pattern;Decreased step length - right;Decreased step length - left;Shuffle;Trunk flexed Gait velocity: decreased Gait velocity interpretation: Below normal speed for  age/gender General Gait Details: required increased time, short rest breaks, VC's for posture.   Stairs Stairs: Yes   Stair Management: One rail Right;With cane;Step to pattern;Forwards Number of Stairs: 4 General stair comments: 25% VC's for sequencing and safety  Wheelchair Mobility    Modified Rankin (Stroke Patients Only)       Balance Overall balance assessment: Needs assistance Sitting-balance support: No upper extremity supported;Feet supported Sitting balance-Leahy Scale: Fair     Standing balance support: Bilateral upper extremity supported Standing balance-Leahy Scale: Poor                              Cognition Arousal/Alertness: Awake/alert Behavior During Therapy: WFL for tasks assessed/performed Overall Cognitive Status: Within Functional Limits for tasks assessed                                        Exercises      General Comments        Pertinent Vitals/Pain Pain Assessment: 0-10 Pain Score: 4  Pain Location: low back Pain Descriptors / Indicators: Sore;Radiating Pain Intervention(s): Limited activity within patient's tolerance;Monitored during session;Repositioned;Heat applied    Home Living                      Prior Function            PT Goals (current goals can now be found in the care plan section)      Frequency    Min 3X/week      PT Plan Current plan remains appropriate  Co-evaluation              AM-PAC PT "6 Clicks" Daily Activity  Outcome Measure  Difficulty turning over in bed (including adjusting bedclothes, sheets and blankets)?: A Little Difficulty moving from lying on back to sitting on the side of the bed? : A Little Difficulty sitting down on and standing up from a chair with arms (e.g., wheelchair, bedside commode, etc,.)?: A Little Help needed moving to and from a bed to chair (including a wheelchair)?: A Little Help needed walking in hospital room?: A  Little Help needed climbing 3-5 steps with a railing? : A Little 6 Click Score: 18    End of Session Equipment Utilized During Treatment: Gait belt Activity Tolerance: Patient tolerated treatment well Patient left: in chair;with call bell/phone within reach Nurse Communication: Mobility status PT Visit Diagnosis: Unsteadiness on feet (R26.81);Muscle weakness (generalized) (M62.81);History of falling (Z91.81);Difficulty in walking, not elsewhere classified (R26.2);Pain     Time: 1000-1030 PT Time Calculation (min) (ACUTE ONLY): 30 min  Charges:  $Gait Training: 8-22 mins $Therapeutic Activity: 8-22 mins                    G Codes:       Almond Lint, SPTA [facility] [place]  PTA [facility]  Acute  Rehab Pager      [phone]

## 2017-10-18 NOTE — Progress Notes (Signed)
Occupational Therapy Treatment Patient Details Name: Daisy [name] MRN: [id number] DOB: [birth year] Today's Date: 10/18/2017    History of present illness Daisy [name] is a 66 y.o. female with medical history significant of MS, addisons disease, chronic back pain with L1 compression fracture with nerve stimulator, history of breast cancer s/p radiation/chemo/surgery, and multiple other medical problems presents with worsening back pain over the past 9 days.    OT comments  Pt plans on going home this day. Husband will A as needed.   Follow Up Recommendations  Supervision/Assistance - 24 hour;Home health OT    Equipment Recommendations  3 in 1 bedside commode;Other (comment)(RW)       Precautions / Restrictions Precautions Precautions: Back;Fall Restrictions Weight Bearing Restrictions: No       Mobility Bed Mobility Overal bed mobility: Needs Assistance Bed Mobility: Rolling;Sidelying to Sit;Sit to Supine Rolling: Supervision Sidelying to sit: Supervision     Sit to sidelying: Min assist General bed mobility comments: min A for LEs into bed  Transfers Overall transfer level: Needs assistance Equipment used: Rolling walker (2 wheeled) Transfers: Sit to/from Stand Sit to Stand: Supervision Stand pivot transfers: Min assist       General transfer comment: VCs for safe hand placement    Balance Overall balance assessment: Needs assistance Sitting-balance support: No upper extremity supported;Feet supported Sitting balance-Leahy Scale: Fair     Standing balance support: Bilateral upper extremity supported Standing balance-Leahy Scale: Poor                             ADL either performed or assessed with clinical judgement   ADL Overall ADL's : Needs assistance/impaired                 Upper Body Dressing : Set up;Sitting   Lower Body Dressing: Sit to/from stand;Minimal assistance   Toilet Transfer: Ambulation;RW;Supervision/safety    Toileting- Clothing Manipulation and Hygiene: Minimal assistance;Sit to/from stand;Cueing for safety;Cueing for sequencing   Tub/ Shower Transfer: Copy Details (indicate cue type and reason): verbalized safety. Pt states husband will be home for each time she gets in shower Functional mobility during ADLs: Minimal assistance;Rolling walker                 Cognition Arousal/Alertness: Awake/alert Behavior During Therapy: WFL for tasks assessed/performed Overall Cognitive Status: Within Functional Limits for tasks assessed                                                     Pertinent Vitals/ Pain       Pain Score: 4  Pain Location: mid section Pain Descriptors / Indicators: Sore;Radiating Pain Intervention(s): Limited activity within patient's tolerance     Prior Functioning/Environment              Frequency  Min 2X/week        Progress Toward Goals  OT Goals(current goals can now be found in the care plan section)  Progress towards OT goals: Progressing toward goals     Plan Discharge plan remains appropriate    Co-evaluation                 AM-PAC PT "6 Clicks" Daily Activity     Outcome Measure   Help from another person  eating meals?: None Help from another person taking care of personal grooming?: A Little Help from another person toileting, which includes using toliet, bedpan, or urinal?: A Little Help from another person bathing (including washing, rinsing, drying)?: A Lot Help from another person to put on and taking off regular upper body clothing?: A Little Help from another person to put on and taking off regular lower body clothing?: A Lot 6 Click Score: 17    End of Session Equipment Utilized During Treatment: Gait belt;Rolling walker  OT Visit Diagnosis: Unsteadiness on feet (R26.81);Muscle weakness (generalized) (M62.81);Pain Pain - Right/Left: (both) Pain - part of body:  (around mid section down to feet)   Activity Tolerance Patient tolerated treatment well   Patient Left in chair;with call bell/phone within reach   Nurse Communication Mobility status        Time: 1423-9532 OT Time Calculation (min): 28 min  Charges: OT General Charges $OT Visit: 1 Visit OT Treatments $Self Care/Home Management : 23-37 mins  [place], [place]   Betsy Pries 10/18/2017, 12:45 PM

## 2017-10-30 ENCOUNTER — Encounter: Payer: Self-pay | Admitting: Genetics

## 2017-10-31 ENCOUNTER — Other Ambulatory Visit: Payer: Self-pay | Admitting: Gastroenterology

## 2017-11-05 ENCOUNTER — Encounter: Payer: Self-pay | Admitting: Genetics

## 2017-11-22 ENCOUNTER — Other Ambulatory Visit: Payer: Self-pay

## 2017-11-22 ENCOUNTER — Encounter (HOSPITAL_COMMUNITY): Payer: Self-pay | Admitting: *Deleted

## 2017-11-23 NOTE — Progress Notes (Signed)
Anesthesia Chart Review:  Pt is a same day work up.   Pt is a 66 year old female scheduled for EGD, botox injection on 11/26/2017 with Ronnette Juniper, MD  PMH includes: HTN, asthma, OSA (was using oxygen at night with CPAP but is no longer using CPAP, no longer using oxygen), COPD, anemia, hepatitis B, multiple sclerosis, breast cancer, Paget's disease of breast, Addison's disease, hypothyroidism, adrenal insufficiency, GERD.  Never smoker.  - S/p spinal cord stimulator revision 04/09/15. S/p R mastectomy 09/25/14. S/p port-a-cath insertion 10/30/12, removal 09/25/16.   Pt with multiple medication allergies.   Medications include: dexilant, hydroxyzine, levothyroxine, losartan, prednisone, zantac. Pt reports she does not increase prednisone dose for procedure  Labs will be obtained per endo protocol.    EKG 09/25/16: NSR  Nuclear stress test 06/28/11: - No evidence for inducible perfusion defect within the left ventricular myocardium. Normal left ventricular ejection fraction.  Echo 11/17/09:   - Left ventricle: The cavity size was normal. Wall thickness wasnormal. Systolic function was normal. The estimated ejectionfraction was in the range of 60% to 65%. Wall motion was normal;there were no regional wall motion abnormalities. Doppler parametersare consistent with abnormal left ventricular relaxation (grade 1diastolic dysfunction).    If no changes, I anticipate pt can proceed with surgery as scheduled.   Willeen Cass, FNP-BC [facility]/Anesthesiology Phone: [phone] 11/23/2017 9:40 AM

## 2017-11-23 NOTE — H&P (Signed)
HPI:  She had EGD done 07/06/17 with Dr. Amedeo Plenty which showed moderate to severe distal esophagitis with evidence of candidiasis. There was no obvious esophageal stricture and a dilation was not performed. Stomach and duodenum were normal. Esophageal biopsies showed squamous mucosa with eosinophilic superficial zone with focal necrotic nuclei, negative for fungal GI and metaplasia. The concentration of eosinophils did not meet diagnostic criteria for eosinophilic esophagitis. Findings were suggestive of sloughing esophagitis which can be associated with chronic medications that injure the esophageal mucosa. She was treated with Diflucan 150mg  once daily for 7 days with some benefit. She is also currently taking Dexilant 60mg  once daily, Carafate twice daily, and Zantac twice daily. Patient reports recurrent dysphagia for the past 2 weeks. She reports decreased appetite and nausea. Food is getting stuck in her throat. She reports voice hoarseness since March 2018. She has history of multiple sclerosis and there has been concern about related esophageal dysmotility. She has had esophageal dilations in the past for distal esophageal strictures. Lab work done 07/30/17 showed normal CBC, CMP, and TSH. Patient has not had a recent barium swallow test or ENT evaluation.   urrent Medications  Taking   Calcium + D + K(Calcium-Vitamin D-Vitamin K) 750-500-40 MG-UNT-MCG Tablet 1 tablet with meals Orally three times a day   Morphine Sulfate 30 MG Tablet 1 tablet Orally every 6-8 hrs as needed, Notes: back/ leg pain   PredniSONE 1 MG Tablet 4 tablets in the morning Orally Once a day, Notes: adrenal insufficiency   Synthroid(L-Thyroxine Sodium) 50 MCG Tablet one tablet before breakfast on even days, one-and-one-half tablets on odd days Orally once a day, Notes: adrenal insufficiency   Dexilant(Dexlansoprazole) 60 MG Capsule Delayed Release 1 capsule in the morning Orally Once a day, Notes: esophageal stricture   Zantac 150  Maximum Strength(RaNITidine HCl) 150 MG Tablet up to 3 Orally Once a day   Carafate(Sucralfate) 1 GM/10ML Suspension 10 ml at bedtime on an empty stomach before meals Orally Four times daily   Metoprolol Tartrate 25 MG Tablet 1 tablet by mouth Twice a day as needed for BP and palpitations, Notes: BP and palpitations   Losartan Potassium 100 MG Tablet 1 tablet Orally Once a day   HydrOXYzine HCl 25 MG Tablet 2 tablet as needed Orally every 8 hrs   Medication List reviewed and reconciled with the patient    Past Medical History  Central adrenal insufficiency.   MRSA.   Eczema- Dr. Ronnald Ramp with Derm.   Scoliosis.   Hypertension Dr. Harrington Challenger.   Hypothyroidism- Dr. Buddy Duty.   Depression/anxiety.   Hx: Breast Cancer in left breast Paget's disease 04/2009.   Chronic pain syndrome secondary to degenerative disk disease with spinal cord stimulator- Dr. Hardin Negus.   Nephrolithiasis.   Idiopathic edema.   Migraines Dr.Dohmeier.   plasma cell dyscrasia (Dr. Marko Plume).   ARF 12/10, followed by Dr. Posey Pronto with renal.   Minimally abnormal kappa-to-lambda free light chain ratio of unclear significance per Dr. Marko Plume 2011.   Recurrent shingles.   Asthma, now following with pulmonology group at [facility].   AODM, ?secondary to steroid use?Marland Kitchen   Obstructive sleep apnea per sleep study in 12/2011 done at [facility].   Bowel inpaction.   Anxiety.   Ulcers.   Sliding hernia.   History of MS- told she had it in [place] many years ago (relapsing remitting).   Addisons disease.   Kidney stones.    Surgical History  Cholecystectomy   cervical diskectomy  Left Breast Mastectomy 08/03/2009  appendectomy   TAH   Insertion of spinal cord stimulator on 01/21/2004 with revision in April 2005, both by Dr. Durene Fruits   salivary gland incision   EGD 07/10/2011,10/2016  egd/colon 11/01/2007,04/11/13   mastectomy 09/25/14  barium swallow 2015  esophageal stretch 2015  Right  radical mastectomy 09/2014   Family History  Father: deceased, diagnosed with CHF (congestive heart failure)  Mother: deceased, diagnosed with CVA  Sister 1: alive, colon polyps  2daughter(s) .   +FHx of Crohn's Dz, including surgery.  Denies family history of colon cancer, colon polyps, or liver disease.  Father and multiple paternal family members had tuberculosis.   Social History  General:  Tobacco use  cigarettes: Never smoked Tobacco history last updated 08/10/2017 Additional Findings: Tobacco Non-User Non-smoker for personal reasons no EXPOSURE TO PASSIVE SMOKE.  no Alcohol.  Caffeine: yes.  no Recreational drug use.  DIET: vegetables, but few fruits due to esophagus. .  Marital Status: married.  Children: 2 daughters .  OCCUPATION: unemployed.  COMMUNICATION BARRIERS: none.  Seat belt use: yes.  no Tobacco Exposure.  3 GCh.   Allergies  IVP Dye: anaphylaxis: Allergy  Reglan: hyper activity: Side Effects  Lamisil: anaphylaxis: Allergy  Lisinopril: coughing: Side Effects  Phenergan: anxiety: Side Effects  Sulfa Drugs: Hallucination: Side Effects  tussines penntinetik cough syrup: bad headaches : Side Effects  Dilaudid: Vomiting and Migraine: Allergy  Vancomycin HCl: swelling: Allergy  Compazine: anxious, shaky: Side Effects  Biaxin: anaphylaxis: Side Effects  Amoxicillin: diarrhea: Side Effects  Ambien: shaky: Side Effects  Mucinex: shaky: Side Effects  Aldara: Sick: Side Effects  Fosamax: esophageal irritation: Side Effects  Carafate: N&V: Side Effects  Trifluoperazine HCl: Hallucination: Side Effects  Bactrim: anaphylaxis  Bactrim DS: hallucinations: Allergy   Hospitalization/Major Diagnostic Procedure  dehydration 12/14-12/16/2010  Chest Pain 06/2011  addison disease 10/2012   Not in the past year 04/2014   Spinal fracture 05/2014  concusion fell 04/2016  not in the past yr 08/2017   Review of Systems  GI PROCEDURE:  no Pacemaker/ AICD, no. no  Artificial heart valves. no MI/heart attack. no Abnormal heart rhythm. no Angina. no CVA. Hypertension YES, YES. no Hypotension. no Asthma, COPD. no Sleep apnea. no Seizure disorders. no Artificial joints. no Severe DJD. no Diabetes. no Significant headaches. Vertigo YES, YES. Depression/anxiety YES, YES anxiety. no Abnormal bleeding. no Kidney Disease. no Liver disease, no. no Chance of pregnancy. no Blood transfusion. no Method of Birth Control. no Birth control pills.      Vital Signs  Wt Declined, Ht 61, Temp 98.1, Pulse sitting 80, BP sitting 108/80.   Examination  Gastroenterology Exam: GENERAL APPEARANCE: Well developed, well nourished, no active distress, pleasant, no acute distress.  ORAL CAVITY: Lips, teeth and gums are normal. Pharynx, tongue, mucosa normal. No evidence of thrush. Voice is hoarse. Marland Kitchen  SCLERA: anicteric .  NECK Full ROM, trachea midline, no thyromegaly or masses.  RESPIRATORY Breath sounds clear to auscultation. No wheezes, rales or rhonchi. Respiration even and unlabored .  CARDIOVASCULAR Normal RRR w/o murmers or gallops. No peripheral edema .  ABDOMEN No masses palpated. Liver and spleen not palpated, normal. Bowel sounds normal, Abdomen not distended and not tender.Marland Kitchen     EGD from 08/23/17 showed features of esophageal dissecans and esophageal stricutre dilated with 20 mm balloon, she also had gastric ulcers and was advised to take protonix BID for 8 weeks.EOE not found. Barium swallow from 08/15/17 showed a smooth narrowing  of distal esophagus near GE junction. She also had an esophageal manometry which showed GE junction outlet obstruction with normal peristalsis, not compatible with achalasia and botox injections was recommended.

## 2017-11-26 ENCOUNTER — Ambulatory Visit (HOSPITAL_COMMUNITY): Payer: Managed Care, Other (non HMO) | Admitting: Emergency Medicine

## 2017-11-26 ENCOUNTER — Encounter (HOSPITAL_COMMUNITY)
Admission: RE | Disposition: A | Discharge status: Home / Self Care | Payer: Self-pay | Source: Ambulatory Visit | Attending: Gastroenterology

## 2017-11-26 ENCOUNTER — Encounter (HOSPITAL_COMMUNITY): Payer: Self-pay | Admitting: *Deleted

## 2017-11-26 ENCOUNTER — Other Ambulatory Visit: Payer: Self-pay

## 2017-11-26 ENCOUNTER — Ambulatory Visit (HOSPITAL_COMMUNITY)
Admission: RE | Admit: 2017-11-26 | Discharge: 2017-11-26 | Disposition: A | Discharge status: Home / Self Care | Payer: Managed Care, Other (non HMO) | Source: Ambulatory Visit | Attending: Gastroenterology | Admitting: Gastroenterology

## 2017-11-26 DIAGNOSIS — E039 Hypothyroidism, unspecified: Secondary | ICD-10-CM | POA: Diagnosis not present

## 2017-11-26 DIAGNOSIS — G894 Chronic pain syndrome: Secondary | ICD-10-CM | POA: Diagnosis not present

## 2017-11-26 DIAGNOSIS — F419 Anxiety disorder, unspecified: Secondary | ICD-10-CM | POA: Diagnosis not present

## 2017-11-26 DIAGNOSIS — J45909 Unspecified asthma, uncomplicated: Secondary | ICD-10-CM | POA: Insufficient documentation

## 2017-11-26 DIAGNOSIS — Z87442 Personal history of urinary calculi: Secondary | ICD-10-CM | POA: Diagnosis not present

## 2017-11-26 DIAGNOSIS — K3189 Other diseases of stomach and duodenum: Secondary | ICD-10-CM | POA: Diagnosis not present

## 2017-11-26 DIAGNOSIS — K2950 Unspecified chronic gastritis without bleeding: Secondary | ICD-10-CM | POA: Diagnosis not present

## 2017-11-26 DIAGNOSIS — K222 Esophageal obstruction: Secondary | ICD-10-CM | POA: Diagnosis not present

## 2017-11-26 DIAGNOSIS — Z79899 Other long term (current) drug therapy: Secondary | ICD-10-CM | POA: Diagnosis not present

## 2017-11-26 DIAGNOSIS — Z853 Personal history of malignant neoplasm of breast: Secondary | ICD-10-CM | POA: Insufficient documentation

## 2017-11-26 DIAGNOSIS — E119 Type 2 diabetes mellitus without complications: Secondary | ICD-10-CM | POA: Diagnosis not present

## 2017-11-26 DIAGNOSIS — G35 Multiple sclerosis: Secondary | ICD-10-CM | POA: Insufficient documentation

## 2017-11-26 DIAGNOSIS — M419 Scoliosis, unspecified: Secondary | ICD-10-CM | POA: Diagnosis not present

## 2017-11-26 DIAGNOSIS — G4733 Obstructive sleep apnea (adult) (pediatric): Secondary | ICD-10-CM | POA: Diagnosis not present

## 2017-11-26 DIAGNOSIS — I10 Essential (primary) hypertension: Secondary | ICD-10-CM | POA: Diagnosis not present

## 2017-11-26 HISTORY — PX: BOTOX INJECTION: SHX5754

## 2017-11-26 HISTORY — DX: Personal history of urinary calculi: Z87.442

## 2017-11-26 HISTORY — DX: Unspecified intracranial injury with loss of consciousness of unspecified duration, initial encounter: S06.9X9A

## 2017-11-26 HISTORY — PX: ESOPHAGOGASTRODUODENOSCOPY: SHX5428

## 2017-11-26 HISTORY — DX: Other intervertebral disc degeneration, lumbar region: M51.36

## 2017-11-26 HISTORY — DX: Other intervertebral disc degeneration, lumbar region without mention of lumbar back pain or lower extremity pain: M51.369

## 2017-11-26 SURGERY — EGD (ESOPHAGOGASTRODUODENOSCOPY)
Anesthesia: Monitor Anesthesia Care

## 2017-11-26 MED ORDER — PROPOFOL 500 MG/50ML IV EMUL
INTRAVENOUS | Status: DC | PRN
  Administered 2017-11-26: 125 ug/kg/min via INTRAVENOUS

## 2017-11-26 MED ORDER — SODIUM CHLORIDE 0.9 % IJ SOLN
100.0000 [IU] | Freq: Once | INTRAMUSCULAR | Status: DC
  Filled 2017-11-26: qty 100

## 2017-11-26 MED ORDER — ONABOTULINUMTOXINA 100 UNITS IJ SOLR
INTRAMUSCULAR | Status: DC | PRN
  Administered 2017-11-26: 4 mL via SUBMUCOSAL

## 2017-11-26 MED ORDER — LACTATED RINGERS IV SOLN
INTRAVENOUS | Status: AC | PRN
  Administered 2017-11-26: 1000 mL via INTRAVENOUS

## 2017-11-26 MED ORDER — SODIUM CHLORIDE 0.9 % IV SOLN: INTRAVENOUS | Status: DC

## 2017-11-26 MED ORDER — PROPOFOL 10 MG/ML IV BOLUS
INTRAVENOUS | Status: DC | PRN
Start: 2017-11-26 — End: 2017-11-26
  Administered 2017-11-26: 50 mg via INTRAVENOUS

## 2017-11-26 NOTE — Brief Op Note (Signed)
11/26/2017  8:55 AM  PATIENT:  Daisy [name]  66 y.o. female  PRE-OPERATIVE DIAGNOSIS:  Dysphagia  POST-OPERATIVE DIAGNOSIS:  botox in 4 quadrants antrum bx for h pylori  PROCEDURE:  Procedure(s): ESOPHAGOGASTRODUODENOSCOPY (EGD) (N/A) BOTOX INJECTION (N/A)  SURGEON:  Surgeon(s) and Role:    Ronnette Juniper, MD - Primary  PHYSICIAN ASSISTANT:   ASSISTANTS: Carmie End, RN, Cletis Athens, Tech  ANESTHESIA:   MAC  EBL:  None  BLOOD ADMINISTERED:none  DRAINS: none   LOCAL MEDICATIONS USED:  NONE  SPECIMEN:  Biopsy / Limited Resection  DISPOSITION OF SPECIMEN:  PATHOLOGY  COUNTS:  YES  TOURNIQUET:  * No tourniquets in log *  DICTATION: .Dragon Dictation  PLAN OF CARE: Discharge to home after PACU  PATIENT DISPOSITION:  PACU - hemodynamically stable.   Delay start of Pharmacological VTE agent (>24hrs) due to surgical blood loss or risk of bleeding: no

## 2017-11-26 NOTE — Discharge Instructions (Signed)
Monitored Anesthesia Care, Care After These instructions provide you with information about caring for yourself after your procedure. Your health care provider may also give you more specific instructions. Your treatment has been planned according to current medical practices, but problems sometimes occur. Call your health care provider if you have any problems or questions after your procedure. What can I expect after the procedure? After your procedure, it is common to:  Feel sleepy for several hours.  Feel clumsy and have poor balance for several hours.  Feel forgetful about what happened after the procedure.  Have poor judgment for several hours.  Feel nauseous or vomit.  Have a sore throat if you had a breathing tube during the procedure.  Follow these instructions at home: For at least 24 hours after the procedure:   Do not: ? Participate in activities in which you could fall or become injured. ? Drive. ? Use heavy machinery. ? Drink alcohol. ? Take sleeping pills or medicines that cause drowsiness. ? Make important decisions or sign legal documents. ? Take care of children on your own.  Rest. Eating and drinking  Follow the diet that is recommended by your health care provider.  If you vomit, drink water, juice, or soup when you can drink without vomiting.  Make sure you have little or no nausea before eating solid foods. General instructions  Have a responsible adult stay with you until you are awake and alert.  Take over-the-counter and prescription medicines only as told by your health care provider.  If you smoke, do not smoke without supervision.  Keep all follow-up visits as told by your health care provider. This is important. Contact a health care provider if:  You keep feeling nauseous or you keep vomiting.  You feel light-headed.  You develop a rash.  You have a fever. Get help right away if:  You have trouble breathing. This information is  not intended to replace advice given to you by your health care provider. Make sure you discuss any questions you have with your health care provider. Document Released: 03/12/2016 Document Revised: 07/12/2016 Document Reviewed: 03/12/2016 Elsevier Interactive Patient Education  2018 [employer]. Esophagogastroduodenoscopy, Care After Refer to this sheet in the next few weeks. These instructions provide you with information about caring for yourself after your procedure. Your health care provider may also give you more specific instructions. Your treatment has been planned according to current medical practices, but problems sometimes occur. Call your health care provider if you have any problems or questions after your procedure. What can I expect after the procedure? After the procedure, it is common to have:  A sore throat.  Nausea.  Bloating.  Dizziness.  Fatigue.  Follow these instructions at home:  Do not eat or drink anything until the numbing medicine (local anesthetic) has worn off and your gag reflex has returned. You will know that the local anesthetic has worn off when you can swallow comfortably.  Do not drive for 24 hours if you received a medicine to help you relax (sedative).  If your health care provider took a tissue sample for testing during the procedure, make sure to get your test results. This is your responsibility. Ask your health care provider or the department performing the test when your results will be ready.  Keep all follow-up visits as told by your health care provider. This is important. Contact a health care provider if:  You cannot stop coughing.  You are not urinating.  You  are urinating less than usual. Get help right away if:  You have trouble swallowing.  You cannot eat or drink.  You have throat or chest pain that gets worse.  You are dizzy or light-headed.  You faint.  You have nausea or vomiting.  You have chills.  You have  a fever.  You have severe abdominal pain.  You have black, tarry, or bloody stools. This information is not intended to replace advice given to you by your health care provider. Make sure you discuss any questions you have with your health care provider. Document Released: 11/06/2012 Document Revised: 04/27/2016 Document Reviewed: 10/14/2015 Elsevier Interactive Patient Education  [employer].

## 2017-11-26 NOTE — Op Note (Signed)
[facility] Patient Name: Daisy [name] Procedure Date : 11/26/2017 MRN: [id number] Attending MD: Ronnette Juniper , MD Date of Birth: 08-May-1951 CSN: [id number] Age: 66 Admit Type: Outpatient Procedure:                Upper GI endoscopy Indications:              Dysphagia(Esopahgeal manometry showed GE junction                            outflow obstruction, normal peristalsis not                            compatible with achalasia), prior EGD with dilation                            caused partial relief Providers:                Ronnette Juniper, MD, Carmie End, RN, Cletis Athens,                            Technician, Lance Coon, CRNA Referring MD:              Medicines:                Monitored Anesthesia Care Complications:            No immediate complications. Estimated Blood Loss:     Estimated blood loss: none. Procedure:                Pre-Anesthesia Assessment:                           - Prior to the procedure, a History and Physical                            was performed, and patient medications and                            allergies were reviewed. The patient's tolerance of                            previous anesthesia was also reviewed. The risks                            and benefits of the procedure and the sedation                            options and risks were discussed with the patient.                            All questions were answered, and informed consent                            was obtained. Prior Anticoagulants: The patient has                            taken  no previous anticoagulant or antiplatelet                            agents. ASA Grade Assessment: III - A patient with                            severe systemic disease. After reviewing the risks                            and benefits, the patient was deemed in                            satisfactory condition to undergo the procedure.                           After  obtaining informed consent, the endoscope was                            passed under direct vision. Throughout the                            procedure, the patient's blood pressure, pulse, and                            oxygen saturations were monitored continuously. The                            EG-[id number] ([id number]) scope was introduced through the                            mouth, and advanced to the second part of duodenum.                            The upper GI endoscopy was accomplished without                            difficulty. The patient tolerated the procedure                            well. Scope In: Scope Out: Findings:      No endoscopic abnormality was evident in the esophagus to explain the       patient's complaint of dysphagia. Z line was identified at 35 cm. 1 cm       above the GE junction, 34 cm from insertion the area was successfully       injected with (100 units) botulinum toxin, 25/ml, 4 quadrant 1 ml       injection each.      Striped moderately erythematous mucosa without bleeding was found in the       gastric body, in the gastric antrum, in the prepyloric region of the       stomach and at the pylorus. Biopsies were taken with a cold forceps for       Helicobacter pylori testing.      The cardia and gastric fundus were normal on retroflexion.  The examined duodenum was normal. Impression:               - No endoscopic esophageal abnormality to explain                            patient's dysphagia. Injected with botulinum toxin.                           - Erythematous mucosa in the gastric body, antrum,                            prepyloric region of the stomach and pylorus.                            Biopsied.                           - Normal examined duodenum. Moderate Sedation:      Patient did not receive moderate sedation for this procedure, but       instead received monitored anesthesia care. Recommendation:           - Patient has a  contact number available for                            emergencies. The signs and symptoms of potential                            delayed complications were discussed with the                            patient. Return to normal activities tomorrow.                            Written discharge instructions were provided to the                            patient.                           - Resume regular diet.                           - Continue present medications.                           - Await pathology results. Procedure Code(s):        --- Professional ---                           [phone], Esophagogastroduodenoscopy, flexible,                            transoral; with biopsy, single or multiple                           43236, 10, Esophagogastroduodenoscopy, flexible,  transoral; with directed submucosal injection(s),                            any substance Diagnosis Code(s):        --- Professional ---                           R13.10, Dysphagia, unspecified                           K31.89, Other diseases of stomach and duodenum CPT copyright 2016 American Medical Association. All rights reserved. The codes documented in this report are preliminary and upon coder review may  be revised to meet current compliance requirements. Ronnette Juniper, MD 11/26/2017 8:54:47 AM This report has been signed electronically. Number of Addenda: 0

## 2017-11-26 NOTE — Transfer of Care (Signed)
Immediate Anesthesia Transfer of Care Note  Patient: Daisy [name]  Procedure(s) Performed: ESOPHAGOGASTRODUODENOSCOPY (EGD) (N/A ) BOTOX INJECTION (N/A )  Patient Location: Endoscopy Unit  Anesthesia Type:MAC  Level of Consciousness: awake, alert , oriented and patient cooperative  Airway & Oxygen Therapy: Patient Spontanous Breathing  Post-op Assessment: Report given to RN and Post -op Vital signs reviewed and stable  Post vital signs: Reviewed and stable  Last Vitals:  Vitals:   11/26/17 0730  BP: (!) 166/98  Resp: 15  Temp: 37 C  SpO2: 94%    Last Pain:  Vitals:   11/26/17 0730  TempSrc: Oral         Complications: No apparent anesthesia complications

## 2017-11-26 NOTE — Interval H&P Note (Signed)
History and Physical Interval Note: 66/female with dysphagia, past EGD with dilatation and esophageal manometry showing GE junction outflow obstruction, normal peristalsis not compatible with achalasia.  11/26/2017 7:38 AM  Daisy [name]  has presented today for EGD with botox injections, with the diagnosis of Dysphagia  The various methods of treatment have been discussed with the patient and family. After consideration of risks, benefits and other options for treatment, the patient has consented to  Procedure(s): ESOPHAGOGASTRODUODENOSCOPY (EGD) (N/A) BOTOX INJECTION (N/A) as a surgical intervention .  The patient's history has been reviewed, patient examined, no change in status, stable for surgery.  I have reviewed the patient's chart and labs.  Questions were answered to the patient's satisfaction.     Ronnette Juniper

## 2017-11-26 NOTE — Anesthesia Preprocedure Evaluation (Signed)
Anesthesia Evaluation  Patient identified by MRN, date of birth, ID band Patient awake    Reviewed: Allergy & Precautions, NPO status , Patient's Chart, lab work & pertinent test results, reviewed documented beta blocker date and time   History of Anesthesia Complications (+) PONV, POST - OP SPINAL HEADACHE and history of anesthetic complications  Airway Mallampati: II  TM Distance: >3 FB Neck ROM: Full    Dental  (+) Teeth Intact, Dental Advisory Given, Partial Upper   Pulmonary asthma , sleep apnea and Continuous Positive Airway Pressure Ventilation , COPD,  oxygen dependent,    Pulmonary exam normal breath sounds clear to auscultation       Cardiovascular hypertension, Pt. on medications and Pt. on home beta blockers Normal cardiovascular exam Rhythm:Regular Rate:Normal     Neuro/Psych  Headaches, PSYCHIATRIC DISORDERS Anxiety MS    GI/Hepatic hiatal hernia, GERD  Medicated,(+) Hepatitis -, B  Endo/Other  Hypothyroidism Addison's disease  Renal/GU negative Renal ROS     Musculoskeletal  (+) Arthritis , Fibromyalgia -  Abdominal   Peds  Hematology  (+) anemia ,   Anesthesia Other Findings Day of surgery medications reviewed with the patient.  Reproductive/Obstetrics                             Anesthesia Physical  Anesthesia Plan  ASA: III  Anesthesia Plan: MAC   Post-op Pain Management:    Induction: Intravenous  PONV Risk Score and Plan: Ondansetron, Propofol infusion and Treatment may vary due to age or medical condition  Airway Management Planned: Nasal Cannula and Natural Airway  Additional Equipment:   Intra-op Plan:   Post-operative Plan:   Informed Consent: I have reviewed the patients History and Physical, chart, labs and discussed the procedure including the risks, benefits and alternatives for the proposed anesthesia with the patient or authorized representative  who has indicated his/her understanding and acceptance.   Dental advisory given  Plan Discussed with: CRNA and Anesthesiologist  Anesthesia Plan Comments:         Anesthesia Quick Evaluation

## 2017-11-26 NOTE — Anesthesia Procedure Notes (Addendum)
Procedure Name: MAC Date/Time: 11/26/2017 8:42 AM Performed by: Lance Coon, CRNA Pre-anesthesia Checklist: Patient identified, Emergency Drugs available, Suction available, Timeout performed and Patient being monitored Patient Re-evaluated:Patient Re-evaluated prior to induction Oxygen Delivery Method: Nasal cannula

## 2017-11-26 NOTE — Op Note (Signed)
EGD was performed for dysphagia. Prior EGD with balloon dilatation had produced partial relief. Prior biopsies from esophagus were not compatible with eosinophilic esophagitis. Esophageal manometry was compatible with GE junction outflow obstruction, patient had normal peristalsis- not compatible with achalasia.   Esophagus appeared unremarkable. GE junction was located at 35 cm. 25 units per mL of Botox was injected in 4 quadrant fashion, 1 cm above the GE junction, total 4 ml, 100 units of botox was injected. Erythematous striped mucosa was noted in the gastric body, antrum and pylorus. Biopsies obtained taken to rule out H. pylori infection. Retroflexion was unremarkable. Duodenal bulb and rest of the duodenum appeared unremarkable.   Recommendation: Resume regular diet. Acid suppression with proton pump inhibitor such as Protonix to be continued. Await pathology, treat H. Pylori.  Ronnette Juniper, M.D.

## 2017-11-26 NOTE — Anesthesia Postprocedure Evaluation (Signed)
Anesthesia Post Note  Patient: Daisy [name]  Procedure(s) Performed: ESOPHAGOGASTRODUODENOSCOPY (EGD) (N/A ) BOTOX INJECTION (N/A )     Patient location during evaluation: PACU Anesthesia Type: MAC Level of consciousness: awake and alert Pain management: pain level controlled Vital Signs Assessment: post-procedure vital signs reviewed and stable Respiratory status: spontaneous breathing and respiratory function stable Cardiovascular status: stable Postop Assessment: no apparent nausea or vomiting Anesthetic complications: no    Last Vitals:  Vitals:   11/26/17 0908 11/26/17 0920  BP: 98/76 116/70  Pulse: 88 82  Resp: 19 14  Temp:    SpO2: 96% 96%    Last Pain:  Vitals:   11/26/17 0900  TempSrc: Oral                 Jigar Zielke DANIEL

## 2017-11-27 ENCOUNTER — Encounter (HOSPITAL_COMMUNITY): Payer: Self-pay | Admitting: Gastroenterology

## 2018-01-17 DIAGNOSIS — R05 Cough: Secondary | ICD-10-CM | POA: Diagnosis not present

## 2018-01-17 DIAGNOSIS — J069 Acute upper respiratory infection, unspecified: Secondary | ICD-10-CM | POA: Diagnosis not present

## 2018-01-17 DIAGNOSIS — J029 Acute pharyngitis, unspecified: Secondary | ICD-10-CM | POA: Diagnosis not present

## 2018-01-17 DIAGNOSIS — R110 Nausea: Secondary | ICD-10-CM | POA: Diagnosis not present

## 2018-01-17 DIAGNOSIS — R509 Fever, unspecified: Secondary | ICD-10-CM | POA: Diagnosis not present

## 2018-01-17 DIAGNOSIS — M25512 Pain in left shoulder: Secondary | ICD-10-CM | POA: Diagnosis not present

## 2018-02-05 DIAGNOSIS — M4726 Other spondylosis with radiculopathy, lumbar region: Secondary | ICD-10-CM | POA: Diagnosis not present

## 2018-02-05 DIAGNOSIS — M47812 Spondylosis without myelopathy or radiculopathy, cervical region: Secondary | ICD-10-CM | POA: Diagnosis not present

## 2018-02-05 DIAGNOSIS — Z79891 Long term (current) use of opiate analgesic: Secondary | ICD-10-CM | POA: Diagnosis not present

## 2018-02-05 DIAGNOSIS — G894 Chronic pain syndrome: Secondary | ICD-10-CM | POA: Diagnosis not present

## 2018-02-26 ENCOUNTER — Other Ambulatory Visit: Payer: Self-pay | Admitting: Gastroenterology

## 2018-02-28 DIAGNOSIS — R110 Nausea: Secondary | ICD-10-CM | POA: Diagnosis not present

## 2018-02-28 DIAGNOSIS — M545 Low back pain: Secondary | ICD-10-CM | POA: Diagnosis not present

## 2018-02-28 DIAGNOSIS — K210 Gastro-esophageal reflux disease with esophagitis: Secondary | ICD-10-CM | POA: Diagnosis not present

## 2018-02-28 DIAGNOSIS — G8929 Other chronic pain: Secondary | ICD-10-CM | POA: Diagnosis not present

## 2018-03-01 DIAGNOSIS — E039 Hypothyroidism, unspecified: Secondary | ICD-10-CM | POA: Diagnosis not present

## 2018-03-01 DIAGNOSIS — E230 Hypopituitarism: Secondary | ICD-10-CM | POA: Diagnosis not present

## 2018-03-01 DIAGNOSIS — M810 Age-related osteoporosis without current pathological fracture: Secondary | ICD-10-CM | POA: Diagnosis not present

## 2018-03-01 DIAGNOSIS — K219 Gastro-esophageal reflux disease without esophagitis: Secondary | ICD-10-CM | POA: Diagnosis not present

## 2018-03-01 DIAGNOSIS — Z8781 Personal history of (healed) traumatic fracture: Secondary | ICD-10-CM | POA: Diagnosis not present

## 2018-03-04 HISTORY — PX: OTHER SURGICAL HISTORY: SHX169

## 2018-03-16 ENCOUNTER — Other Ambulatory Visit: Payer: Self-pay | Admitting: Nurse Practitioner

## 2018-03-21 ENCOUNTER — Other Ambulatory Visit: Payer: Self-pay

## 2018-03-21 ENCOUNTER — Encounter (HOSPITAL_COMMUNITY): Payer: Self-pay | Admitting: *Deleted

## 2018-04-01 NOTE — H&P (Signed)
67 year old female with dysphagia. EGD from 12/18 performed for esophageal manometry showing evidence of GE junction outflow obstruction but normal peristalsis not compatible with achalasia showed no endoscopic abnormality and 100 units of botulinum toxin was injected 1 cm of ofGE junction( 25 units per ML, in 4 quadrants). She presents for repeat endoscopy with Botox injection.  Current Medications  Taking   Calcium + D + K(Calcium-Vitamin D-Vitamin K) 750-500-40 MG-UNT-MCG Tablet 3 tablet with meals Orally once a day   Morphine Sulfate 30 MG Tablet 1 tablet Orally every 8 hrs, Notes: back/ leg pain PHILLIPS   Zantac 150 Maximum Strength(RaNITidine HCl) 150 MG Tablet up to 3 Orally Once a day, Notes: HAYES   Polyethylene Glycol - Powder as directed by mouth once a day   Senna-Docusate Sodium 8.6-50 MG Tablet 2 tablet Orally Once a day   Probiotic - Packet 1 packet in 8 ounces of water Orally Twice a day   Ibuprofen 800 MG Tablet 1 tablet with food or milk as needed Orally Three times a day, Notes: PRN   Nystop(Nystatin) 100000 UNIT/GM Powder APPLY TWICE A DAY AS NEEDED TO GROIN RASH , Notes: ROSS PRN   Metoprolol Tartrate 25 MG Tablet 1 tablet with food Orally Twice a day prn   Ondansetron 4 MG Tablet Disintegrating 1 tablet as needed Orally every 6 hrs, Notes: ROSS (supplement if hydrox not working)   Levothyroxine Sodium 75 MCG Tablet 1 tab Orally Alternate 75 mg every other day w/50 mg tablet, Notes: ROSS   HydrOXYzine HCl 25 MG Tablet 2 tablet as needed Orally every 8 hrs   Levothyroxine Sodium 50 MCG Tablet 1 tab Orally Alternate 50 mg daily w/75 mg. tab, Notes: okay to fill as generic per Dr. Buddy Duty   Zofran(Ondansetron HCl) 4 MG Tablet 1 to 2 tablets Orally TID prn nausea   PredniSONE 1 MG Tablet 4 tablets [DOUBLE THE DOSE ON SICK DAYS] Orally Twice a day   Cyclobenzaprine HCl 5 MG Tablet 1 tablet as needed Orally Three times a day   Pantoprazole Sodium 40 MG Tablet Delayed Release 1  tablet Orally Once a day   Medication List reviewed and reconciled with the patient    Past Medical History  Central adrenal insufficiency.   MRSA.   Eczema- Dr. Ronnald Ramp with Derm.   Scoliosis.   Hypertension Dr. Harrington Challenger.   Hypothyroidism- Dr. Buddy Duty.   Depression/anxiety.   Hx: Breast Cancer in left breast Paget's disease 04/2009.   Chronic pain syndrome secondary to degenerative disk disease with spinal cord stimulator- Dr. Hardin Negus.   Nephrolithiasis.   Idiopathic edema.   Migraines Dr.Dohmeier.   plasma cell dyscrasia (Dr. Marko Plume).   ARF 12/10, followed by Dr. Posey Pronto with renal.   Minimally abnormal kappa-to-lambda free light chain ratio of unclear significance per Dr. Marko Plume 2011.   Recurrent shingles.   Asthma, now following with pulmonology group at [facility].   AODM, ?secondary to steroid use?Marland Kitchen   Obstructive sleep apnea per sleep study in 12/2011 done at [place].   Bowel inpaction.   Anxiety.   Ulcers.   Sliding hernia.   History of MS- told she had it in [place] many years ago (relapsing remitting).   Addisons disease.   Kidney stones.    Social History  General:  Tobacco use  cigarettes: Never smoked Tobacco history last updated 03/01/2018 Additional Findings: Tobacco Non-User Non-smoker for personal reasons no EXPOSURE TO PASSIVE SMOKE.  no Alcohol.  Caffeine: yes.  no Recreational drug  use.  DIET: vegetables, but few fruits due to esophagus. .  Marital Status: married.  Children: 2 daughters- 1976, 1978.  OCCUPATION: unemployed.  COMMUNICATION BARRIERS: none.  Seat belt use: yes.  no Tobacco Exposure.  3 GCh, 1996, 2005, 2013 (all Grandsons).   Allergies  IVP Dye: anaphylaxis - Allergy  Reglan: hyper activity - Side Effects  Lamisil: anaphylaxis - Allergy  Lisinopril: coughing - Side Effects  Phenergan: anxiety - Side Effects  Sulfa Drugs: Hallucination - Side Effects  tussines penntinetik cough  syrup: bad headaches - Side Effects  Dilaudid: Vomiting and Migraine - Allergy  Vancomycin HCl: swelling - Allergy  Compazine: anxious, shaky - Side Effects  Biaxin: anaphylaxis - Side Effects  Amoxicillin: diarrhea - Side Effects  Ambien: shaky - Side Effects  Mucinex: shaky - Side Effects  Aldara: Sick - Side Effects  Fosamax: esophageal irritation - Side Effects  Carafate: N&V - Side Effects  Trifluoperazine HCl: Hallucination - Side Effects  Bactrim: anaphylaxis  Bactrim DS: hallucinations - Allergy  Novicanie: anaphylaxis - Allergy   Review of Systems  CONSTITUTIONAL:  Weight gain none.  GASTROENTEROLOGY:  Constipation yes, lately.  ENDOCRINOLOGY:  Cold intolerance "I get really cold". Heat intolerance "then I get really hot"; "please don't go up on my steroids".   Dysphagia: EGD with Botox injection.  Ronnette Juniper, MD

## 2018-04-02 ENCOUNTER — Ambulatory Visit (HOSPITAL_COMMUNITY): Admission: RE | Admit: 2018-04-02 | Payer: Medicare Other | Source: Ambulatory Visit | Admitting: Gastroenterology

## 2018-04-02 DIAGNOSIS — S92325A Nondisplaced fracture of second metatarsal bone, left foot, initial encounter for closed fracture: Secondary | ICD-10-CM | POA: Diagnosis not present

## 2018-04-02 DIAGNOSIS — M79672 Pain in left foot: Secondary | ICD-10-CM | POA: Diagnosis not present

## 2018-04-02 HISTORY — DX: Obstructive sleep apnea (adult) (pediatric): G47.33

## 2018-04-02 HISTORY — DX: Family history of other specified conditions: Z84.89

## 2018-04-02 SURGERY — EGD (ESOPHAGOGASTRODUODENOSCOPY)
Anesthesia: Monitor Anesthesia Care

## 2018-04-03 DIAGNOSIS — M79672 Pain in left foot: Secondary | ICD-10-CM | POA: Diagnosis not present

## 2018-04-15 ENCOUNTER — Other Ambulatory Visit: Payer: Self-pay | Admitting: Gastroenterology

## 2018-05-01 DIAGNOSIS — S92325A Nondisplaced fracture of second metatarsal bone, left foot, initial encounter for closed fracture: Secondary | ICD-10-CM | POA: Diagnosis not present

## 2018-05-29 DIAGNOSIS — E538 Deficiency of other specified B group vitamins: Secondary | ICD-10-CM | POA: Diagnosis not present

## 2018-05-29 DIAGNOSIS — Z0000 Encounter for general adult medical examination without abnormal findings: Secondary | ICD-10-CM | POA: Diagnosis not present

## 2018-05-29 DIAGNOSIS — R7611 Nonspecific reaction to tuberculin skin test without active tuberculosis: Secondary | ICD-10-CM | POA: Diagnosis not present

## 2018-05-29 DIAGNOSIS — Z23 Encounter for immunization: Secondary | ICD-10-CM | POA: Diagnosis not present

## 2018-05-29 DIAGNOSIS — I10 Essential (primary) hypertension: Secondary | ICD-10-CM | POA: Diagnosis not present

## 2018-05-29 DIAGNOSIS — K210 Gastro-esophageal reflux disease with esophagitis: Secondary | ICD-10-CM | POA: Diagnosis not present

## 2018-05-29 DIAGNOSIS — R110 Nausea: Secondary | ICD-10-CM | POA: Diagnosis not present

## 2018-05-29 DIAGNOSIS — J45909 Unspecified asthma, uncomplicated: Secondary | ICD-10-CM | POA: Diagnosis not present

## 2018-05-29 DIAGNOSIS — B029 Zoster without complications: Secondary | ICD-10-CM | POA: Diagnosis not present

## 2018-06-10 ENCOUNTER — Encounter (HOSPITAL_COMMUNITY): Payer: Self-pay

## 2018-06-10 ENCOUNTER — Other Ambulatory Visit: Payer: Self-pay

## 2018-06-10 NOTE — H&P (Signed)
HPI: 66/female with ongoing dysphagia, s/p botox injection X 1, for a repeat EGD and botox injection.  Current Medications  Taking   Calcium + D + K(Calcium-Vitamin D-Vitamin K) 750-500-40 MG-UNT-MCG Tablet 3 tablet with meals Orally once a day   Morphine Sulfate 30 MG Tablet 1 tablet Orally every 8 hrs, Notes: back/ leg pain PHILLIPS   Zantac 150 Maximum Strength(raNITIdine HCl) 150 MG Tablet up to 3 Orally Once a day, Notes: HAYES   Polyethylene Glycol - Powder as directed by mouth once a day   Senna-Docusate Sodium 8.6-50 MG Tablet 2 tablet Orally Once a day   Probiotic - Packet 1 packet in 8 ounces of water Orally Twice a day   Ibuprofen 800 MG Tablet 1 tablet with food or milk as needed Orally Three times a day, Notes: PRN   Nystop(Nystatin) 100000 UNIT/GM Powder APPLY TWICE A DAY AS NEEDED TO GROIN RASH , Notes: ROSS PRN   Metoprolol Tartrate 25 MG Tablet 1 tablet with food Orally Twice a day prn   Ondansetron 4 MG Tablet Disintegrating 1 tablet as needed Orally every 6 hrs, Notes: ROSS (supplement if hydrox not working)   HydrOXYzine HCl 25 MG Tablet 2 tablet as needed Orally every 8 hrs   Zofran(Ondansetron HCl) 4 MG Tablet 1 to 2 tablets Orally TID prn nausea   Cyclobenzaprine HCl 5 MG Tablet 1 tablet as needed Orally Three times a day   Levothyroxine Sodium 50 MCG Tablet 1 tab Orally Alternate 50 mg daily w/75 mg. tab, Notes: okay to fill as generic per Dr. Buddy Duty   Levothyroxine Sodium 75 MCG Tablet 1 tab Orally Alternate 75 mg every other day w/50 mg tablet, Notes: ROSS   PredniSONE 1 MG Tablet 4 tablets [DOUBLE THE DOSE ON SICK DAYS] Orally Twice a day   Pantoprazole Sodium 40 MG Tablet Delayed Release TAKE 1 TABLET BY MOUTH DAILY   Medication List reviewed and reconciled with the patient    Past Medical History  Central adrenal insufficiency.   MRSA.   Eczema- Dr. Ronnald Ramp with Derm.   Scoliosis.   Hypertension Dr. Harrington Challenger.   Hypothyroidism- Dr. Buddy Duty.    Depression/anxiety.   Hx: Breast Cancer in left breast Paget's disease 04/2009.   Chronic pain syndrome secondary to degenerative disk disease with spinal cord stimulator- Dr. Hardin Negus.   Nephrolithiasis.   Idiopathic edema.   Migraines Dr.Dohmeier.   plasma cell dyscrasia (Dr. Marko Plume).   ARF 12/10, followed by Dr. Posey Pronto with renal.   Minimally abnormal kappa-to-lambda free light chain ratio of unclear significance per Dr. Marko Plume 2011.   Recurrent shingles.   Asthma, now following with pulmonology group at [facility].   AODM, ?secondary to steroid use?Marland Kitchen   Obstructive sleep apnea per sleep study in 12/2011 done at [place].   Bowel inpaction.   Anxiety.   Ulcers.   Sliding hernia.   History of MS- told she had it in [place] many years ago (relapsing remitting).   Addisons disease.   Kidney stones.    Surgical History  Cholecystectomy   cervical diskectomy   Left Breast Mastectomy 08/03/2009  appendectomy   TAH   Insertion of spinal cord stimulator on 01/21/2004 with revision in April 2005, both by Dr. Durene Fruits   salivary gland incision   EGD 07/10/2011,10/2016  egd/colon 11/01/2007,04/11/13   mastectomy 09/25/14  barium swallow 2015  esophageal stretch 2015  Right radical mastectomy 09/2014  egd w. dil 08/23/17   Family History  Father: deceased, diagnosed  with CHF (congestive heart failure)  Mother: deceased, CVA  Sister 1: alive, colon polyps  2daughter(s) .   +FHx of Crohn\\\\\\\'s Dz, including surgery. \\\\nDenies family history of colon cancer, colon polyps, or liver disease. \\\\n\\\\nFather and multiple paternal family members had tuberculosis.   Social History  General:  Tobacco use  cigarettes: Never smoked Additional Findings: Tobacco Non-User Non-smoker for personal reasons Tobacco history last updated 03/01/2018 no EXPOSURE TO PASSIVE SMOKE.  no Alcohol.  Caffeine: yes.  no Recreational drug use.  DIET:  vegetables, but few fruits due to esophagus. .  Marital Status: married.  Children: 2 daughters- 1976, 1978.  OCCUPATION: unemployed.  COMMUNICATION BARRIERS: none.  Seat belt use: yes.  no Tobacco Exposure.  3 GCh, 1996, 2005, 2013 (all Grandsons).   Allergies  IVP Dye: anaphylaxis - Allergy  Reglan: hyper activity - Side Effects  Lamisil: anaphylaxis - Allergy  Lisinopril: coughing - Side Effects  Phenergan: anxiety - Side Effects  Sulfa Drugs: Hallucination - Side Effects  tussines penntinetik cough syrup: bad headaches - Side Effects  Dilaudid: Vomiting and Migraine - Allergy  Vancomycin HCl: swelling - Allergy  Compazine: anxious, shaky - Side Effects  Biaxin: anaphylaxis - Side Effects  Amoxicillin: diarrhea - Side Effects  Ambien: shaky - Side Effects  Mucinex: shaky - Side Effects  Aldara: Sick - Side Effects  Fosamax: esophageal irritation - Side Effects  Carafate: N&V - Side Effects  Trifluoperazine HCl: Hallucination - Side Effects  Bactrim: anaphylaxis  Bactrim DS: hallucinations - Allergy  Novicanie: anaphylaxis - Allergy  Tizanidine HCl: not effective - Lack of Therapeutic Effect   Hospitalization/Major Diagnostic Procedure  dehydration 12/14-12/16/2010  Chest Pain 06/2011  addison disease 10/2012   Not in the past year 04/2014   Spinal fracture 05/2014  concusion fell 04/2016  not in the past yr 08/2017   Review of Systems  CONSTITUTIONAL: Patient denies fevers, chills, changes in weight, sleep problems, changes in appetite. HEENT: Patient complains of recent fall. Patient denies changes in vision, headaches, sinus congestion. CARDIOLOGY: Patient complains of dizziness. Patient denies chest pain, palpitations, shortness of breath, leg edema. RESPIRATORY: Patient denies shortness of breath, cough, chest congestion, DOE (dyspnea on exertion), wheezing. UROLOGY: Patient denies dysuria, difficulty urinating, hematuria, sexual dysfunction. FEMALE REPRODUCTIVE:  Patient denies breast pain, breast tenderness, irregular periods, pelvic pain. GASTROENTEROLOGY: Patient denies nausea, vomiting, diarrhea, constipation, heartburn, abdominal pain. MUSCULOSKELETAL: Patient complains of left foot pain. Patient denies back pain, muscle weaknes. NEUROLOGY: Patient denies numbness/tingling, dizziness, confusion, loss of balance, loss of consciousness, tremors, seizures. PSYCHOLOGY: Patient denies depression, anxiety, hallucinations, insomnia, memory loss. DERMATOLOGY: Patient denies skin changes, rashes or lesion. HEMATOLOGY: Patient denies abnormal bleeding.   A/P: Dysphagia Plan EGD with Botox injections.  Ronnette Juniper, MD

## 2018-06-11 ENCOUNTER — Ambulatory Visit (HOSPITAL_COMMUNITY)
Admission: RE | Admit: 2018-06-11 | Discharge: 2018-06-11 | Disposition: A | Discharge status: Home / Self Care | Payer: Medicare Other | Source: Ambulatory Visit | Attending: Gastroenterology | Admitting: Gastroenterology

## 2018-06-11 ENCOUNTER — Encounter (HOSPITAL_COMMUNITY): Payer: Self-pay | Admitting: *Deleted

## 2018-06-11 ENCOUNTER — Ambulatory Visit (HOSPITAL_COMMUNITY): Payer: Medicare Other | Admitting: Certified Registered Nurse Anesthetist

## 2018-06-11 ENCOUNTER — Encounter (HOSPITAL_COMMUNITY)
Admission: RE | Disposition: A | Discharge status: Home / Self Care | Payer: Self-pay | Source: Ambulatory Visit | Attending: Gastroenterology

## 2018-06-11 ENCOUNTER — Other Ambulatory Visit: Payer: Self-pay

## 2018-06-11 DIAGNOSIS — M797 Fibromyalgia: Secondary | ICD-10-CM | POA: Insufficient documentation

## 2018-06-11 DIAGNOSIS — Z9689 Presence of other specified functional implants: Secondary | ICD-10-CM | POA: Diagnosis not present

## 2018-06-11 DIAGNOSIS — Z882 Allergy status to sulfonamides status: Secondary | ICD-10-CM | POA: Diagnosis not present

## 2018-06-11 DIAGNOSIS — Z79899 Other long term (current) drug therapy: Secondary | ICD-10-CM | POA: Insufficient documentation

## 2018-06-11 DIAGNOSIS — Z91041 Radiographic dye allergy status: Secondary | ICD-10-CM | POA: Diagnosis not present

## 2018-06-11 DIAGNOSIS — E271 Primary adrenocortical insufficiency: Secondary | ICD-10-CM | POA: Insufficient documentation

## 2018-06-11 DIAGNOSIS — R1310 Dysphagia, unspecified: Secondary | ICD-10-CM | POA: Insufficient documentation

## 2018-06-11 DIAGNOSIS — Z853 Personal history of malignant neoplasm of breast: Secondary | ICD-10-CM | POA: Insufficient documentation

## 2018-06-11 DIAGNOSIS — F419 Anxiety disorder, unspecified: Secondary | ICD-10-CM | POA: Diagnosis not present

## 2018-06-11 DIAGNOSIS — G894 Chronic pain syndrome: Secondary | ICD-10-CM | POA: Diagnosis not present

## 2018-06-11 DIAGNOSIS — Z888 Allergy status to other drugs, medicaments and biological substances status: Secondary | ICD-10-CM | POA: Diagnosis not present

## 2018-06-11 DIAGNOSIS — E039 Hypothyroidism, unspecified: Secondary | ICD-10-CM | POA: Diagnosis not present

## 2018-06-11 DIAGNOSIS — K449 Diaphragmatic hernia without obstruction or gangrene: Secondary | ICD-10-CM | POA: Diagnosis not present

## 2018-06-11 DIAGNOSIS — M1990 Unspecified osteoarthritis, unspecified site: Secondary | ICD-10-CM | POA: Diagnosis not present

## 2018-06-11 DIAGNOSIS — K3189 Other diseases of stomach and duodenum: Secondary | ICD-10-CM | POA: Insufficient documentation

## 2018-06-11 DIAGNOSIS — Z885 Allergy status to narcotic agent status: Secondary | ICD-10-CM | POA: Diagnosis not present

## 2018-06-11 DIAGNOSIS — J45909 Unspecified asthma, uncomplicated: Secondary | ICD-10-CM | POA: Diagnosis not present

## 2018-06-11 DIAGNOSIS — Z881 Allergy status to other antibiotic agents status: Secondary | ICD-10-CM | POA: Insufficient documentation

## 2018-06-11 DIAGNOSIS — G4733 Obstructive sleep apnea (adult) (pediatric): Secondary | ICD-10-CM | POA: Diagnosis not present

## 2018-06-11 DIAGNOSIS — K259 Gastric ulcer, unspecified as acute or chronic, without hemorrhage or perforation: Secondary | ICD-10-CM | POA: Diagnosis not present

## 2018-06-11 DIAGNOSIS — Z9981 Dependence on supplemental oxygen: Secondary | ICD-10-CM | POA: Insufficient documentation

## 2018-06-11 DIAGNOSIS — F329 Major depressive disorder, single episode, unspecified: Secondary | ICD-10-CM | POA: Diagnosis not present

## 2018-06-11 DIAGNOSIS — M419 Scoliosis, unspecified: Secondary | ICD-10-CM | POA: Diagnosis not present

## 2018-06-11 DIAGNOSIS — K219 Gastro-esophageal reflux disease without esophagitis: Secondary | ICD-10-CM | POA: Diagnosis not present

## 2018-06-11 DIAGNOSIS — I10 Essential (primary) hypertension: Secondary | ICD-10-CM | POA: Insufficient documentation

## 2018-06-11 DIAGNOSIS — Z9989 Dependence on other enabling machines and devices: Secondary | ICD-10-CM | POA: Insufficient documentation

## 2018-06-11 DIAGNOSIS — Z8614 Personal history of Methicillin resistant Staphylococcus aureus infection: Secondary | ICD-10-CM | POA: Insufficient documentation

## 2018-06-11 DIAGNOSIS — Z8619 Personal history of other infectious and parasitic diseases: Secondary | ICD-10-CM | POA: Insufficient documentation

## 2018-06-11 DIAGNOSIS — G35 Multiple sclerosis: Secondary | ICD-10-CM | POA: Diagnosis not present

## 2018-06-11 DIAGNOSIS — K209 Esophagitis, unspecified: Secondary | ICD-10-CM | POA: Diagnosis not present

## 2018-06-11 DIAGNOSIS — D509 Iron deficiency anemia, unspecified: Secondary | ICD-10-CM | POA: Diagnosis not present

## 2018-06-11 HISTORY — PX: BOTOX INJECTION: SHX5754

## 2018-06-11 HISTORY — PX: ESOPHAGOGASTRODUODENOSCOPY: SHX5428

## 2018-06-11 HISTORY — PX: BIOPSY: SHX5522

## 2018-06-11 SURGERY — EGD (ESOPHAGOGASTRODUODENOSCOPY)
Anesthesia: Monitor Anesthesia Care

## 2018-06-11 MED ORDER — PROPOFOL 10 MG/ML IV BOLUS
INTRAVENOUS | Status: AC
  Filled 2018-06-11: qty 60

## 2018-06-11 MED ORDER — ONABOTULINUMTOXINA 100 UNITS IJ SOLR
INTRAMUSCULAR | Status: AC
  Filled 2018-06-11: qty 100

## 2018-06-11 MED ORDER — SODIUM CHLORIDE 0.9 % IV SOLN: INTRAVENOUS | Status: DC

## 2018-06-11 MED ORDER — PROPOFOL 500 MG/50ML IV EMUL
INTRAVENOUS | Status: DC | PRN
  Administered 2018-06-11: 150 ug/kg/min via INTRAVENOUS

## 2018-06-11 MED ORDER — PROPOFOL 10 MG/ML IV BOLUS
INTRAVENOUS | Status: DC | PRN
  Administered 2018-06-11: 60 mg via INTRAVENOUS

## 2018-06-11 MED ORDER — LACTATED RINGERS IV SOLN
INTRAVENOUS | Status: DC | PRN
  Administered 2018-06-11: 10:00:00 via INTRAVENOUS

## 2018-06-11 MED ORDER — SODIUM CHLORIDE 0.9 % IV SOLN
INTRAVENOUS | Status: DC
  Administered 2018-06-11: 10:00:00 via INTRAVENOUS

## 2018-06-11 MED ORDER — SODIUM CHLORIDE 0.9 % IJ SOLN
INTRAMUSCULAR | Status: DC | PRN
  Administered 2018-06-11: 11:00:00 via SUBMUCOSAL

## 2018-06-11 MED ORDER — SODIUM CHLORIDE 0.9 % IJ SOLN
INTRAMUSCULAR | Status: AC
  Filled 2018-06-11: qty 10

## 2018-06-11 NOTE — Brief Op Note (Signed)
06/11/2018  10:53 AM  PATIENT:  Daisy [name]  67 y.o. female  PRE-OPERATIVE DIAGNOSIS:  Dysphagia  POST-OPERATIVE DIAGNOSIS:  botox to distal esophagus  PROCEDURE:  Procedure(s): ESOPHAGOGASTRODUODENOSCOPY (EGD) (N/A) BOTOX INJECTION (N/A)  SURGEON:  Surgeon(s) and Role:    Ronnette Juniper, MD - Primary  PHYSICIAN ASSISTANT:   ASSISTANTS: Jobe Igo, RN, Cletis Athens, Tech  ANESTHESIA:   MAC  EBL:  Minimal  BLOOD ADMINISTERED:none  DRAINS: none   LOCAL MEDICATIONS USED:  NONE  SPECIMEN:  Biopsy / Limited Resection  DISPOSITION OF SPECIMEN:  PATHOLOGY  COUNTS:  YES  TOURNIQUET:  * No tourniquets in log *  DICTATION: .Dragon Dictation  PLAN OF CARE: Discharge to home after PACU  PATIENT DISPOSITION:  PACU - hemodynamically stable.   Delay start of Pharmacological VTE agent (>24hrs) due to surgical blood loss or risk of bleeding: no

## 2018-06-11 NOTE — Anesthesia Postprocedure Evaluation (Signed)
Anesthesia Post Note  Patient: Daisy [name]  Procedure(s) Performed: ESOPHAGOGASTRODUODENOSCOPY (EGD) (N/A ) BOTOX INJECTION (N/A ) BIOPSY     Patient location during evaluation: Endoscopy Anesthesia Type: MAC Level of consciousness: awake and alert Pain management: pain level controlled Vital Signs Assessment: post-procedure vital signs reviewed and stable Respiratory status: spontaneous breathing, nonlabored ventilation, respiratory function stable and patient connected to nasal cannula oxygen Cardiovascular status: stable and blood pressure returned to baseline Postop Assessment: no apparent nausea or vomiting Anesthetic complications: no    Last Vitals:  Vitals:   06/11/18 1053 06/11/18 1105  BP: (!) 129/57 138/60  Pulse: 74 75  Resp: 13 12  Temp: 36.6 C   SpO2: 100% 100%    Last Pain:  Vitals:   06/11/18 1105  TempSrc:   PainSc: 0-No pain                 Montez Hageman

## 2018-06-11 NOTE — Transfer of Care (Signed)
Immediate Anesthesia Transfer of Care Note  Patient: Daisy [name]  Procedure(s) Performed: ESOPHAGOGASTRODUODENOSCOPY (EGD) (N/A ) BOTOX INJECTION (N/A )  Patient Location: Endoscopy Unit  Anesthesia Type:MAC  Level of Consciousness: awake, alert , oriented and patient cooperative  Airway & Oxygen Therapy: Patient Spontanous Breathing and Patient connected to nasal cannula oxygen  Post-op Assessment: Report given to RN, Post -op Vital signs reviewed and stable and Patient moving all extremities  Post vital signs: Reviewed and stable  Last Vitals:  Vitals Value Taken Time  BP    Temp    Pulse 74 06/11/2018 10:52 AM  Resp 14 06/11/2018 10:52 AM  SpO2 100 % 06/11/2018 10:52 AM  Vitals shown include unvalidated device data.  Last Pain:  Vitals:   06/11/18 1034  TempSrc:   PainSc: 0-No pain         Complications: No apparent anesthesia complications

## 2018-06-11 NOTE — Anesthesia Preprocedure Evaluation (Signed)
Anesthesia Evaluation  Patient identified by MRN, date of birth, ID band Patient awake    Reviewed: Allergy & Precautions, NPO status , Patient's Chart, lab work & pertinent test results, reviewed documented beta blocker date and time   History of Anesthesia Complications (+) PONV, POST - OP SPINAL HEADACHE and history of anesthetic complications  Airway Mallampati: II  TM Distance: >3 FB Neck ROM: Full    Dental  (+) Teeth Intact, Dental Advisory Given, Partial Upper   Pulmonary asthma , sleep apnea and Continuous Positive Airway Pressure Ventilation , COPD,  oxygen dependent,    Pulmonary exam normal breath sounds clear to auscultation       Cardiovascular hypertension, Pt. on medications and Pt. on home beta blockers Normal cardiovascular exam Rhythm:Regular Rate:Normal     Neuro/Psych  Headaches, PSYCHIATRIC DISORDERS Anxiety MS    GI/Hepatic hiatal hernia, GERD  Medicated,(+) Hepatitis -, B  Endo/Other  Hypothyroidism Addison's disease  Renal/GU negative Renal ROS     Musculoskeletal  (+) Arthritis , Fibromyalgia -  Abdominal   Peds  Hematology  (+) anemia ,   Anesthesia Other Findings Day of surgery medications reviewed with the patient.  Reproductive/Obstetrics                             Anesthesia Physical  Anesthesia Plan  ASA: III  Anesthesia Plan: MAC   Post-op Pain Management:    Induction: Intravenous  PONV Risk Score and Plan: Ondansetron, Propofol infusion and Treatment may vary due to age or medical condition  Airway Management Planned: Nasal Cannula and Natural Airway  Additional Equipment:   Intra-op Plan:   Post-operative Plan:   Informed Consent: I have reviewed the patients History and Physical, chart, labs and discussed the procedure including the risks, benefits and alternatives for the proposed anesthesia with the patient or authorized representative  who has indicated his/her understanding and acceptance.   Dental advisory given  Plan Discussed with: CRNA and Anesthesiologist  Anesthesia Plan Comments:         Anesthesia Quick Evaluation  

## 2018-06-11 NOTE — Interval H&P Note (Signed)
History and Physical Interval Note: 66/female with dysphagia for EGD with botox injection and possible dilation if needed. 06/11/2018 10:34 AM  Daisy [name]  has presented today for EGD with BOTOX and possible dilation if needed with the diagnosis of Dysphagia  The various methods of treatment have been discussed with the patient and family. After consideration of risks, benefits and other options for treatment, the patient has consented to  Procedure(s): ESOPHAGOGASTRODUODENOSCOPY (EGD) (N/A) BOTOX INJECTION (N/A) as a surgical intervention .  The patient's history has been reviewed, patient examined, no change in status, stable for surgery.  I have reviewed the patient's chart and labs.  Questions were answered to the patient's satisfaction.     Daisy [name]

## 2018-06-11 NOTE — Op Note (Signed)
[facility] Patient Name: Daisy [name] Procedure Date: 06/11/2018 MRN: [id number] Attending MD: Ronnette Juniper , MD Date of Birth: 07-Feb-1951 CSN: [id number] Age: 67 Admit Type: Outpatient Procedure:                Upper GI endoscopy Indications:              Dysphagia, GE junction outflow obstruction noted on                            esophageal manometery but not typical of achalasia Providers:                Ronnette Juniper, MD, Jobe Igo, RN, Cletis [place],                            Technician Referring MD:              Medicines:                Monitored Anesthesia Care Complications:            No immediate complications. Estimated blood loss:                            Minimal. Estimated Blood Loss:     Estimated blood loss was minimal. Procedure:                Pre-Anesthesia Assessment:                           - Prior to the procedure, a History and Physical                            was performed, and patient medications and                            allergies were reviewed. The patient's tolerance of                            previous anesthesia was also reviewed. The risks                            and benefits of the procedure and the sedation                            options and risks were discussed with the patient.                            All questions were answered, and informed consent                            was obtained. Prior Anticoagulants: The patient has                            taken no previous anticoagulant or antiplatelet  agents. ASA Grade Assessment: III - A patient with                            severe systemic disease. After reviewing the risks                            and benefits, the patient was deemed in                            satisfactory condition to undergo the procedure.                           After obtaining informed consent, the endoscope was                            passed  under direct vision. Throughout the                            procedure, the patient's blood pressure, pulse, and                            oxygen saturations were monitored continuously. The                            EG-[id number] ([id number]) scope was introduced through the                            mouth, and advanced to the second part of duodenum.                            The upper GI endoscopy was accomplished without                            difficulty. The patient tolerated the procedure                            well. Scope In: Scope Out: Findings:      LA Grade B (one or more mucosal breaks greater than 5 mm, not extending       between the tops of two mucosal folds) esophagitis with no bleeding was       found 32 to 35 cm from the incisors. Biopsies were taken with a cold       forceps for histology.      The Z-line was regular and was found 35 cm from the incisors.      Localized moderately erythematous mucosa without bleeding was found in       the gastric fundus.      Localized moderately erythematous mucosa without bleeding was found in       the gastric antrum. Biopsies were taken with a cold forceps for       Helicobacter pylori testing.      Two non-bleeding superficial gastric ulcers with a clean ulcer base       (Forrest Class III) were found in the prepyloric region of the stomach.       The  largest lesion was 5 mm in largest dimension.      A mild deformity was found at the pylorus likely secondary to ulcers.      The examined duodenum was normal.      Areas at 34 cm from the incisors were successfully injected with 100       units botulinum toxin(25 units/ml, 1 ml in each quadrant, 1 cm above the       GE junction). Impression:               - LA Grade B esophagitis. Biopsied.                           - Z-line regular, 35 cm from the incisors.                           - Erythematous mucosa in the gastric fundus.                           - Erythematous  mucosa in the antrum. Biopsied.                           - Non-bleeding gastric ulcers with a clean ulcer                            base (Forrest Class III).                           - Acquired deformity in the pylorus.                           - Normal examined duodenum.                           - Areas at 34 cm from incisors successfully                            injected with botulinum toxin. Moderate Sedation:      Patient did not receive moderate sedation for this procedure, but       instead received monitored anesthesia care. Recommendation:           - Patient has a contact number available for                            emergencies. The signs and symptoms of potential                            delayed complications were discussed with the                            patient. Return to normal activities tomorrow.                            Written discharge instructions were provided to the  patient.                           - Resume regular diet.                           - Continue present medications.                           - Aggressive anti reflux measures such as weight                            loss, elevate head end of the bed during sleep,                            avoid or limit caffeinated products and space last                            meal of the day and bedtime by at least 3 hours. Procedure Code(s):        --- Professional ---                           [id number], 54, Esophagogastroduodenoscopy, flexible,                            transoral; with directed submucosal injection(s),                            any substance                           43239, Esophagogastroduodenoscopy, flexible,                            transoral; with biopsy, single or multiple Diagnosis Code(s):        --- Professional ---                           K20.9, Esophagitis, unspecified                           K31.89, Other diseases of stomach and  duodenum                           K25.9, Gastric ulcer, unspecified as acute or                            chronic, without hemorrhage or perforation                           R13.10, Dysphagia, unspecified CPT copyright 2017 American Medical Association. All rights reserved. The codes documented in this report are preliminary and upon coder review may  be revised to meet current compliance requirements. Ronnette Juniper, MD 06/11/2018 10:53:34 AM This report has been signed electronically. Number of Addenda: 0

## 2018-06-11 NOTE — Discharge Instructions (Signed)

## 2018-06-12 ENCOUNTER — Encounter (HOSPITAL_COMMUNITY): Payer: Self-pay | Admitting: Gastroenterology

## 2018-06-17 DIAGNOSIS — M4726 Other spondylosis with radiculopathy, lumbar region: Secondary | ICD-10-CM | POA: Diagnosis not present

## 2018-06-17 DIAGNOSIS — M47812 Spondylosis without myelopathy or radiculopathy, cervical region: Secondary | ICD-10-CM | POA: Diagnosis not present

## 2018-06-17 DIAGNOSIS — G894 Chronic pain syndrome: Secondary | ICD-10-CM | POA: Diagnosis not present

## 2018-06-17 DIAGNOSIS — Z79891 Long term (current) use of opiate analgesic: Secondary | ICD-10-CM | POA: Diagnosis not present

## 2018-07-03 ENCOUNTER — Other Ambulatory Visit: Payer: Self-pay | Admitting: Surgery

## 2018-07-03 DIAGNOSIS — Z853 Personal history of malignant neoplasm of breast: Secondary | ICD-10-CM | POA: Diagnosis not present

## 2018-07-03 DIAGNOSIS — R222 Localized swelling, mass and lump, trunk: Secondary | ICD-10-CM | POA: Diagnosis not present

## 2018-07-08 DIAGNOSIS — H01021 Squamous blepharitis right upper eyelid: Secondary | ICD-10-CM | POA: Diagnosis not present

## 2018-07-08 DIAGNOSIS — H01025 Squamous blepharitis left lower eyelid: Secondary | ICD-10-CM | POA: Diagnosis not present

## 2018-07-08 DIAGNOSIS — H40033 Anatomical narrow angle, bilateral: Secondary | ICD-10-CM | POA: Diagnosis not present

## 2018-07-08 DIAGNOSIS — H01024 Squamous blepharitis left upper eyelid: Secondary | ICD-10-CM | POA: Diagnosis not present

## 2018-07-08 DIAGNOSIS — B029 Zoster without complications: Secondary | ICD-10-CM | POA: Diagnosis not present

## 2018-07-08 DIAGNOSIS — G35 Multiple sclerosis: Secondary | ICD-10-CM | POA: Diagnosis not present

## 2018-07-08 DIAGNOSIS — H01022 Squamous blepharitis right lower eyelid: Secondary | ICD-10-CM | POA: Diagnosis not present

## 2018-07-17 ENCOUNTER — Encounter (HOSPITAL_COMMUNITY): Payer: Self-pay

## 2018-07-17 ENCOUNTER — Other Ambulatory Visit: Payer: Self-pay

## 2018-07-17 ENCOUNTER — Encounter (HOSPITAL_COMMUNITY)
Admission: RE | Admit: 2018-07-17 | Discharge: 2018-07-17 | Disposition: A | Discharge status: Home / Self Care | Payer: Medicare Other | Source: Ambulatory Visit | Attending: Surgery | Admitting: Surgery

## 2018-07-17 DIAGNOSIS — J449 Chronic obstructive pulmonary disease, unspecified: Secondary | ICD-10-CM | POA: Insufficient documentation

## 2018-07-17 DIAGNOSIS — I10 Essential (primary) hypertension: Secondary | ICD-10-CM | POA: Diagnosis not present

## 2018-07-17 DIAGNOSIS — Z01812 Encounter for preprocedural laboratory examination: Secondary | ICD-10-CM | POA: Diagnosis not present

## 2018-07-17 DIAGNOSIS — Z9889 Other specified postprocedural states: Secondary | ICD-10-CM | POA: Insufficient documentation

## 2018-07-17 DIAGNOSIS — K219 Gastro-esophageal reflux disease without esophagitis: Secondary | ICD-10-CM | POA: Insufficient documentation

## 2018-07-17 DIAGNOSIS — Z79899 Other long term (current) drug therapy: Secondary | ICD-10-CM | POA: Diagnosis not present

## 2018-07-17 DIAGNOSIS — G35 Multiple sclerosis: Secondary | ICD-10-CM | POA: Diagnosis not present

## 2018-07-17 HISTORY — DX: Localized swelling, mass and lump, unspecified: R22.9

## 2018-07-17 HISTORY — DX: Reserved for concepts with insufficient information to code with codable children: IMO0002

## 2018-07-17 LAB — COMPREHENSIVE METABOLIC PANEL
ALT: 11 U/L (ref 0–44)
AST: 19 U/L (ref 15–41)
Albumin: 3.8 g/dL (ref 3.5–5.0)
Alkaline Phosphatase: 59 U/L (ref 38–126)
Anion gap: 9 (ref 5–15)
BUN: 13 mg/dL (ref 8–23)
CHLORIDE: 101 mmol/L (ref 98–111)
CO2: 28 mmol/L (ref 22–32)
CREATININE: 0.84 mg/dL (ref 0.44–1.00)
Calcium: 9.3 mg/dL (ref 8.9–10.3)
GFR calc non Af Amer: >60 mL/min (ref >60)
Glucose, Bld: 110 mg/dL — ABNORMAL HIGH (ref 70–99)
Potassium: 4.5 mmol/L (ref 3.5–5.1)
SODIUM: 138 mmol/L (ref 135–145)
Total Bilirubin: 0.5 mg/dL (ref 0.3–1.2)
Total Protein: 7.1 g/dL (ref 6.5–8.1)

## 2018-07-17 LAB — CBC
HCT: 44.5 % (ref 36.0–46.0)
Hemoglobin: 13.8 g/dL (ref 12.0–15.0)
MCH: 27.8 pg (ref 26.0–34.0)
MCHC: 31 g/dL (ref 30.0–36.0)
MCV: 89.7 fL (ref 78.0–100.0)
PLATELETS: 254 10*3/uL (ref 150–400)
RBC: 4.96 MIL/uL (ref 3.87–5.11)
RDW: 13.8 % (ref 11.5–15.5)
WBC: 6.8 10*3/uL (ref 4.0–10.5)

## 2018-07-17 NOTE — Progress Notes (Signed)
Pt denies SOB, chest pain, and being under the care of a cardiologist. Pt denies having a cardiac cath. Pt denies having recent labs. Recent EKG tracing and LOV note from PCP, Dr. Harrington Challenger of [facility] on chart. Pt denies having a chest x ray within the last year. Pt chart forwarded to anesthesia to review EKG on chart.

## 2018-07-17 NOTE — Pre-Procedure Instructions (Addendum)
   Daisy [name]  07/17/2018    [id number] [address], [address] [place] [address] Phone: [phone] Fax: [phone]   Your procedure is scheduled on Wednesday, July 24, 2018  Report to [facility] Admitting at 9:30 A.M.  Call this number if you have problems the morning of surgery:  [phone]   Remember:  Do not eat or drink after midnight Tuesday, July 23, 2018  You may drink clear liquids until 8:30 A.M..  Clear liquids allowed are:                    Water, Juice (non-citric and without pulp), Carbonated beverages, Clear Tea, Black Coffee only, Plain Jell-O only, Gatorade and Plain Popsicles only    Take these medicines the morning of surgery with A SIP OF WATER : levothyroxine (SYNTHROID), morphine, pantoprazole (PROTONIX), predniSONE (DELTASONE), ranitidine (ZANTAC), if needed: hydrOXYzine (ATARAX/VISTARIL) for anxiety, metoprolol tartrate (LOPRESSOR) for blood pressure, ondansetron (ZOFRAN ODT) for nausea or vomiting, valACYclovir (VALTREX) for shingles Stop taking Aspirin ( unless advised otherwise by surgeon), vitamins, fish oil and herbal medications. Do not take any NSAIDs ie: Ibuprofen, Advil, Naproxen (Aleve), Motrin, BC and Goody Powder; stop now.   Do not wear jewelry, make-up or nail polish.  Do not wear lotions, powders, or perfumes, or deodorant.  Do not shave 48 hours prior to surgery.  Men may shave face and neck.  Do not bring valuables to the hospital.  [facility] is not responsible for any belongings or valuables.  Contacts, dentures or bridgework may not be worn into surgery.   Patients discharged the day of surgery will not be allowed to drive home.  Special instructions: See " [place] -Preparing For Surgery " sheet Please read over the following fact sheets that you were given. Pain Booklet, Coughing and Deep Breathing and Surgical Site Infection Prevention

## 2018-07-18 NOTE — Progress Notes (Signed)
Anesthesia Chart Review:  Case:  [id number] Date/Time:  07/24/18 1115   Procedure:  EXCISION OF RIGHT CHEST WALL MASS ERAS PATHWAY (N/A )   Anesthesia type:  General   Pre-op diagnosis:  Right chest wall mass, Hx of breast cancer   Location:  [facility] OR ROOM 02 / [place] [facility]   Surgeon:  Coralie Keens, MD      DISCUSSION: Patient is a 67 year old female scheduled for the above procedure.   History includes never smoker, post-operative N/V, HTN, asthma, COPD, OSA (no CPAP), anemia, GERD, hiatal hernia, multiple sclerosis (diagnosed '89, relapsing and remitting), fibromyalgia, frequent falls, hepatitis ("B" although reported transmission through drinking after an infected individual, so question if this was actually A), left breast cancer (Paget's disease of breast, s/p left mastectomy 08/03/09; s/p elective right mastectomy 09/25/14), hypothyroidism, adrenal insufficiency (secondary Addison's disease), Port-a-cath 10/30/12 (for Addison's disease with poor IV assess; s/p removal 09/25/16), spinal cord stimulator revision 04/09/15, dysphagia (s/p EGD with Botox to distal esophagus 06/11/18).   Based on medication list, she is on prednisone 4 mg daily. She has adrenal insufficiency. She does not appear to be on any specific agent for her MS.  She denied chest pain and SOB at PAT. If no acute changes then I would anticipate that she can proceed as planned. Consider stress steroids (defer to anesthesiologist).     VS: BP 113/67   Pulse 85   Temp 36.8 C   Resp 18   Ht 5\' 1"  (1.549 m)   Wt 63.7 kg   SpO2 95%   BMI 26.53 kg/m   PROVIDERS: Lawerance Cruel, MD is PCP with [facility]. Last visit 06/01/18. He lists other providers as: patient's pain doctor as Dr. Hardin Negus (pain medicine), Dr. Buddy Duty (endocrinology) with previous visits with Dr. Posey Pronto (nephrology, 11/2009 for ARF), Dr. Marko Plume (hematology), and [facility].    LABS: Labs reviewed: Acceptable for surgery. (all labs ordered are  listed, but only abnormal results are displayed)  Labs Reviewed  COMPREHENSIVE METABOLIC PANEL - Abnormal; Notable for the following components:      Result Value   Glucose, Bld 110 (*)    All other components within normal limits  CBC    EKG: 05/29/18 [facility], as part of Medicare IPPE): ST at 109 bpm. Cannot rule out old anterior infarct. Dr. Myriam Jacobson commented that patient has bilateral mastectomies and doubted finding of infarct. Rate is faster and reverse r wave progression in V2-3 noted when compared to 09/25/16 tracing.   CV: Nuclear stress test 06/28/11: IMPRESSION: No evidence for inducible perfusion defect within the left ventricular myocardium. Normal left ventricular ejection fraction, 58%.  Echo 11/17/09:  Study Conclusions: Left ventricle: The cavity size was normal. Wall thickness wasnormal. Systolic function was normal. The estimated ejectionfraction was in the range of 60% to 65%. Wall motion was normal;there were no regional wall motion abnormalities. Doppler parametersare consistent with abnormal left ventricular relaxation (grade 1diastolic dysfunction).    Past Medical History:  Diagnosis Date  . Addison's disease ([place])   . Adenocarcinoma of breast ([place]) 07/13/1992   LEFT LUMPECTOMY AND CHEMO AND RADIATION;   NOW HAS RIGHT BREAST MASS 09/25/14 - PLANS MASTECTOMY   . Adrenal insufficiency ([place])    "w/secondary Addison's"  . Anemia   . Arthritis   . Asthma    using incentive spirometer alot now  . Brittle bone disease   . Bruises easily   . Chronic bronchitis   . Chronic  pain    DR. MARK PHILLIPS WITH PAIN CLINIC -  CHRONIC BACK PAIN AND SCOLIOSIS AND PREVIOUS BACK SURGERY AND NOW LUMBAR FRACTURE - PT HAS [employer] SPINAL STIMULATOR WHICH SHE CAN TURN ON AND OFF ; PT ON MORPINE AND CLONAZEPAM AND ROBAXIN  . COPD (chronic obstructive pulmonary disease) ([place])   . DDD (degenerative disc disease), lumbar   . Falls frequently   . Family  history of adverse reaction to anesthesia    ponv  . Fibromyalgia   . Fracture    LUMBAR 1 - FROM A FALL JUNE 2015 - PT HAS A LOT OF SEVERE BACK PAIN AND DIFFICULTY WALKING  . GERD (gastroesophageal reflux disease)   . H/O esophageal spasm    "I can have as many as 66 TUMS before they stop"  . H/O hiatal hernia   . Head injury with loss of consciousness ([place])   . Headache(784.0)    "when I don't eat" (02/19/12)  . 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 History of kidney stones    1 large stone now  . History of urinary urgency    AZO HELPS - DOES TURN URINE ORANGE; DR. Risa Krahl IS PT'S UROLOGIST  . Hypertension   . Hypothyroidism   . Mass    right chest wall  . Migraines    "much better now" (02/19/12)  . Multiple sclerosis ([place]) 05/1988   Relasping and Remitting"  . OSA (obstructive sleep apnea)   . 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 Pneumonia   . PONV (postoperative nausea and vomiting)    one episode 1993 vomiting after surgery  . Poor venous access    PT STATES HER PORT A CATH NOT TO BE USED FOR SURGERY/ IV'S  . Positive TB test 2017   "ran deep in my father's family; went on INH for awhile; it caused migraines"  . Radiation adverse effect    "100% blockage on left; 50% on right salivary; can drain w/massage"  . Restless leg syndrome, controlled 02/19/12   "much better since being on CPAP; been on that ~ 1 month now"  . Shingles   . Shortness of breath dyspnea    with exertion, improved, asthma improved  . Sleep apnea    no cpap used due to pt took off all the time   . Spinal headache   . Stress incontinence in female   . Upper GI bleeding 1970-1980   "had 4"    Past Surgical History:  Procedure Laterality Date  . ABDOMINAL HYSTERECTOMY  1987  . APPENDECTOMY  1960's  . BACK SURGERY  ~ 2004   "chipped disk out to put in permanent stimulator"  . BAND HEMORRHOIDECTOMY  1979  . BILATERAL OOPHORECTOMY   1988  . BIOPSY  06/11/2018   Procedure: BIOPSY;  Surgeon: Ronnette Juniper, MD;  Location: [facility];  Service: Gastroenterology;;  . Lum [place] INJECTION N/A 11/26/2017   Procedure: BOTOX INJECTION;  Surgeon: Ronnette Juniper, MD;  Location: [place];  Service: Gastroenterology;  Laterality: N/A;  . BOTOX INJECTION N/A 06/11/2018   Procedure: BOTOX INJECTION;  Surgeon: Ronnette Juniper, MD;  Location: [facility];  Service: Gastroenterology;  Laterality: N/A;  . BREAST LUMPECTOMY  07/13/1992   left   . CHOLECYSTECTOMY  1980's  . COLONOSCOPY    . CYSTOSCOPY WITH RETROGRADE PYELOGRAM, URETEROSCOPY AND STENT PLACEMENT Right 09/29/2015   Procedure: CYSTOSCOPY WITH RIGHT RETROGRADE PYELOGRAM, URETEROSCOPY AND  STENT PLACEMENT, STONE EXTRACTION WITH BASKET;  Surgeon: Rana Snare, MD;  Location: [facility];  Service: Urology;  Laterality: Right;  . ESOPHAGEAL MANOMETRY N/A 10/03/2017   Procedure: ESOPHAGEAL MANOMETRY (EM);  Surgeon: Ronnette Juniper, MD;  [facility]: [facility];  Service: Gastroenterology;  Laterality: N/A;  . ESOPHAGOGASTRODUODENOSCOPY N/A 04/17/2014   Procedure: ESOPHAGOGASTRODUODENOSCOPY (EGD);  Surgeon: Missy Sabins, MD;  Location: [facility];  Service: Endoscopy;  Laterality: N/A;  . ESOPHAGOGASTRODUODENOSCOPY N/A 11/26/2017   Procedure: ESOPHAGOGASTRODUODENOSCOPY (EGD);  Surgeon: Ronnette Juniper, MD;  Location: [place];  Service: Gastroenterology;  Laterality: N/A;  . ESOPHAGOGASTRODUODENOSCOPY N/A 06/11/2018   Procedure: ESOPHAGOGASTRODUODENOSCOPY (EGD);  Surgeon: Ronnette Juniper, MD;  Location: [facility];  Service: Gastroenterology;  Laterality: N/A;  . EXTRACORPOREAL SHOCK WAVE LITHOTRIPSY  2016  . fracture Left 03/2018   left foot fracture  . HOLMIUM LASER APPLICATION Right 74/94/4967   Procedure: HOLMIUM LASER LITHOTRIPSY ;  Surgeon: Rana Snare, MD;  [facility]: [facility];  Service: Urology;  Laterality: Right;  . LAPAROSCOPY    . MASTECTOMY  2010   left   . MASTECTOMY Right 10/15  .  PORT-A-CATH REMOVAL Right 09/25/2016   Procedure: REMOVAL PORT-A-CATH RIGHT;  Surgeon: Jackolyn Confer, MD;  Location: [place];  Service: General;  Laterality: Right;  . PORTACATH PLACEMENT  10/30/2012   Procedure: INSERTION PORT-A-CATH;  Surgeon: Odis Hollingshead, MD;  Location[facility];  Service: General;  Laterality: Right;  Right Ultrasound guided port-a-cath insertion  . SALIVARY GLAND SURGERY  ~ 2002   "left was 100% blocked twice; right 50%; only had one surgery"  . SALIVARY GLAND SURGERY LEFT    . SAVORY DILATION N/A 04/17/2014   Procedure: SAVORY DILATION;  Surgeon: Missy Sabins, MD;  [facility];  Service: Endoscopy;  Laterality: N/A;  . SPINAL CORD STIMULATOR INSERTION  ~ 2004  . SPINAL CORD STIMULATOR INSERTION N/A 04/09/2015   Procedure: Spinal cord stimulator revision and replacement of paddle electrode;  Surgeon: Kristeen Miss, MD;  Location: [place] [facility];  Service: Neurosurgery;  Laterality: N/A;  Spinal cord stimulator revision and replacement of paddle electrode  . TONSILLECTOMY AND ADENOIDECTOMY  1964  . TOTAL MASTECTOMY Right 09/25/2014   Procedure: RIGHT MASTECTOMY;  Surgeon: Jackolyn Confer, MD;  [facility]: [facility];  Service: General;  Laterality: Right;    MEDICATIONS: . Calcium-Magnesium-Vitamin D (CALCIUM MAGNESIUM PO)  . Calcium-Vitamin D-Vitamin K (VIACTIV) [phone] MG-UNT-MCG CHEW  . hydrOXYzine (ATARAX/VISTARIL) 25 MG tablet  . levothyroxine (SYNTHROID, LEVOTHROID) 50 MCG tablet  . losartan (COZAAR) 100 MG tablet  . metoprolol tartrate (LOPRESSOR) 25 MG tablet  . morphine (MSIR) 30 MG tablet  . ondansetron (ZOFRAN ODT) 4 MG disintegrating tablet  . pantoprazole (PROTONIX) 40 MG tablet  . predniSONE (DELTASONE) 1 MG tablet  . ranitidine (ZANTAC) 150 MG tablet  . senna-docusate (SENOKOT-S) 8.6-50 MG tablet  . valACYclovir (VALTREX) 1000 MG tablet   No current facility-administered medications for this encounter.     [facility]  Stay [facility]/Anesthesiology Phone [phone] 07/18/2018 3:13 PM

## 2018-07-23 NOTE — H&P (Signed)
  [name] Documented: 07/03/2018 3:52 PM Location: [facility] Patient #: [id number] DOB: Apr 26, 1951 Married / Language: Cleophus Molt / Race: White Female   History of Present Illness (Thirza Pellicano A. Ninfa Linden MD; 07/03/2018 4:11 PM) The patient is a 67 year old female who presents with a complaint of Breast problems. This is a former patient of Dr. Bertrum Sol and Dr. Deon Pilling. She has an extensive history of left breast DCIS and Paget's disease as well as right breast masses and severe fibrocystic disease. She has multiple other medical problems. She is status post bilateral mastectomies in 2010 and 2015. She has been concerned about the increasing size of mass around her mastectomy incision on the right side. She has pain with this. She has intermittently developed yeast infections in this area as well on the right chest. She has some chest pain but no shortness of breath. She has had no arm swelling on either side.   Allergies (Armen Ferguson, CMA; 07/03/2018 4:00 PM) GABAPENTIN  Anaphylaxis. Bactrim DS *ANTI-INFECTIVE AGENTS - MISC.*  hallucinations Trifluoperazine HCl *ANTIPSYCHOTICS/ANTIMANIC AGENTS*  hallucinatons Fosamax *ENDOCRINE AND METABOLIC AGENTS - MISC.*  TIZANIDINE  METOCLOPRAMIDE HYDROCHLORIDE  Sulfa 10 *OPHTHALMIC AGENTS*  hallucinations PROMETHAZINE HYDROCHLORIDE  Augmentin *PENICILLINS*  bad head ache Reglan *GASTROINTESTINAL AGENTS - MISC.*  hyperactivity VANCOMYCIN  GUAIFENESIN  Lisinopril *ANTIHYPERTENSIVES*  Cough. AMLODIPINE  Phenergan *ANTIHISTAMINES*  anxiety Carafate *ULCER DRUGS*  Tussin *COUGH/COLD/ALLERGY*  head ache Clindamycin HCl *Anti-infective Agents - Misc.**  LamISIL *ANTIFUNGALS*  Anaphylaxis. Dilaudid *ANALGESICS - OPIOID*  Keflex *CEPHALOSPORINS*  Ambien *HYPNOTICS/SEDATIVES/SLEEP DISORDER AGENTS*  shacky Vancomycin HCl *ANTI-INFECTIVE AGENTS - MISC.*  Swelling. Biaxin *MACROLIDES*  Mucinex  *COUGH/COLD/ALLERGY*  shaky Aldara *DERMATOLOGICALS*  sick Compazine *ANTIPSYCHOTICS/ANTIMANIC AGENTS*  anxious, shaky Allergies Reconciled   Medication History (Armen Ferguson, CMA; 07/03/2018 4:05 PM) Pantoprazole Sodium (40MG  Tablet DR, Oral) Active. RaNITidine HCl (150MG  Tablet, Oral) Active. HydrOXYzine HCl (25MG  Tablet, Oral) Active. Ibuprofen (800MG  Tablet, Oral) Active. Nystatin (100000 UNIT/GM Cream, External) Active. Levothyroxine Sodium (50MCG Tablet, Oral) Active.  Vitals (Armen Ferguson CMA; 07/03/2018 3:53 PM) 07/03/2018 3:53 PM Height: 62in Temp.: 97.52F  Pulse: 103 (Regular)  P.OX: 97% (Room air) BP: 112/78 (Sitting, Left Arm, Standard)       Physical Exam (Carlis Burnsworth A. Ninfa Linden MD; 07/03/2018 4:12 PM) The physical exam findings are as follows: Note:On exam, she is well appearance Lungs are clear bilaterally Cardiovascular is regular rate and rhythm Abdomen is soft nontender He has bilateral mastectomies. There is at least a 6 cm soft but tender mass along the right mastectomy incision going toward the axilla. The left mastectomy incision is well-healed. There is no axillary adenopathy on either side    Assessment & Plan (Denzell Colasanti A. Ninfa Linden MD; 07/03/2018 4:12 PM) MASS OF RIGHT CHEST WALL (R22.2) Impression: Given her extensive history including the history of breast cancer, excision of this mass is recommended for histologic evaluation to rule out a malignancy. We discussed this in detail. I discussed the surgical procedure. I discussed the risks which includes but is not limited to bleeding, infection, injury to any structures, wound breakdown, cardiopulmonary issues, DVT, etc. She understands and wishes to proceed with surgery HISTORY OF CANCER OF LEFT BREAST (Z85.3)

## 2018-07-24 ENCOUNTER — Ambulatory Visit (HOSPITAL_COMMUNITY): Payer: Medicare Other | Admitting: Certified Registered Nurse Anesthetist

## 2018-07-24 ENCOUNTER — Encounter (HOSPITAL_COMMUNITY)
Admission: RE | Disposition: A | Discharge status: Home / Self Care | Payer: Self-pay | Source: Ambulatory Visit | Attending: Surgery

## 2018-07-24 ENCOUNTER — Ambulatory Visit (HOSPITAL_COMMUNITY): Payer: Medicare Other | Admitting: Vascular Surgery

## 2018-07-24 ENCOUNTER — Ambulatory Visit (HOSPITAL_COMMUNITY)
Admission: RE | Admit: 2018-07-24 | Discharge: 2018-07-24 | Disposition: A | Discharge status: Home / Self Care | Payer: Medicare Other | Source: Ambulatory Visit | Attending: Surgery | Admitting: Surgery

## 2018-07-24 ENCOUNTER — Encounter (HOSPITAL_COMMUNITY): Payer: Self-pay | Admitting: Emergency Medicine

## 2018-07-24 DIAGNOSIS — D171 Benign lipomatous neoplasm of skin and subcutaneous tissue of trunk: Secondary | ICD-10-CM | POA: Diagnosis not present

## 2018-07-24 DIAGNOSIS — J45909 Unspecified asthma, uncomplicated: Secondary | ICD-10-CM | POA: Diagnosis not present

## 2018-07-24 DIAGNOSIS — Z882 Allergy status to sulfonamides status: Secondary | ICD-10-CM | POA: Insufficient documentation

## 2018-07-24 DIAGNOSIS — Z91041 Radiographic dye allergy status: Secondary | ICD-10-CM | POA: Insufficient documentation

## 2018-07-24 DIAGNOSIS — M1990 Unspecified osteoarthritis, unspecified site: Secondary | ICD-10-CM | POA: Insufficient documentation

## 2018-07-24 DIAGNOSIS — Z881 Allergy status to other antibiotic agents status: Secondary | ICD-10-CM | POA: Diagnosis not present

## 2018-07-24 DIAGNOSIS — L905 Scar conditions and fibrosis of skin: Secondary | ICD-10-CM | POA: Diagnosis not present

## 2018-07-24 DIAGNOSIS — Z853 Personal history of malignant neoplasm of breast: Secondary | ICD-10-CM | POA: Diagnosis not present

## 2018-07-24 DIAGNOSIS — B1910 Unspecified viral hepatitis B without hepatic coma: Secondary | ICD-10-CM | POA: Insufficient documentation

## 2018-07-24 DIAGNOSIS — M797 Fibromyalgia: Secondary | ICD-10-CM | POA: Insufficient documentation

## 2018-07-24 DIAGNOSIS — J449 Chronic obstructive pulmonary disease, unspecified: Secondary | ICD-10-CM | POA: Diagnosis not present

## 2018-07-24 DIAGNOSIS — I10 Essential (primary) hypertension: Secondary | ICD-10-CM | POA: Insufficient documentation

## 2018-07-24 DIAGNOSIS — R222 Localized swelling, mass and lump, trunk: Secondary | ICD-10-CM | POA: Diagnosis not present

## 2018-07-24 DIAGNOSIS — K219 Gastro-esophageal reflux disease without esophagitis: Secondary | ICD-10-CM | POA: Insufficient documentation

## 2018-07-24 DIAGNOSIS — L987 Excessive and redundant skin and subcutaneous tissue: Secondary | ICD-10-CM | POA: Diagnosis not present

## 2018-07-24 DIAGNOSIS — Z9013 Acquired absence of bilateral breasts and nipples: Secondary | ICD-10-CM | POA: Diagnosis not present

## 2018-07-24 DIAGNOSIS — G4730 Sleep apnea, unspecified: Secondary | ICD-10-CM | POA: Insufficient documentation

## 2018-07-24 DIAGNOSIS — Z888 Allergy status to other drugs, medicaments and biological substances status: Secondary | ICD-10-CM | POA: Diagnosis not present

## 2018-07-24 HISTORY — PX: MASS EXCISION: SHX2000

## 2018-07-24 SURGERY — EXCISION MASS
Anesthesia: General | Site: Chest | Laterality: Right

## 2018-07-24 MED ORDER — FENTANYL CITRATE (PF) 100 MCG/2ML IJ SOLN
INTRAMUSCULAR | Status: AC
  Filled 2018-07-24: qty 2

## 2018-07-24 MED ORDER — OXYCODONE HCL 5 MG PO TABS
ORAL_TABLET | ORAL | Status: AC
  Administered 2018-07-24: 5 mg via ORAL
  Filled 2018-07-24: qty 1

## 2018-07-24 MED ORDER — LIDOCAINE 2% (20 MG/ML) 5 ML SYRINGE
INTRAMUSCULAR | Status: DC | PRN
  Administered 2018-07-24: 80 mg via INTRAVENOUS

## 2018-07-24 MED ORDER — PROPOFOL 10 MG/ML IV BOLUS
INTRAVENOUS | Status: AC
  Filled 2018-07-24: qty 20

## 2018-07-24 MED ORDER — EPHEDRINE SULFATE-NACL 50-0.9 MG/10ML-% IV SOSY
PREFILLED_SYRINGE | INTRAVENOUS | Status: DC | PRN
  Administered 2018-07-24: 10 mg via INTRAVENOUS

## 2018-07-24 MED ORDER — PROPOFOL 10 MG/ML IV BOLUS
INTRAVENOUS | Status: DC | PRN
  Administered 2018-07-24: 200 mg via INTRAVENOUS

## 2018-07-24 MED ORDER — ACETAMINOPHEN 500 MG PO TABS
1000.0000 mg | ORAL_TABLET | ORAL | Status: AC
  Administered 2018-07-24: 1000 mg via ORAL
  Filled 2018-07-24: qty 2

## 2018-07-24 MED ORDER — OXYCODONE HCL 5 MG PO TABS: 5.0000 mg | ORAL_TABLET | Freq: Four times a day (QID) | ORAL | 0 refills | Status: DC | PRN

## 2018-07-24 MED ORDER — CEFAZOLIN SODIUM-DEXTROSE 2-4 GM/100ML-% IV SOLN
2.0000 g | INTRAVENOUS | Status: AC
  Administered 2018-07-24: 2 g via INTRAVENOUS
  Filled 2018-07-24: qty 100

## 2018-07-24 MED ORDER — OXYCODONE HCL 5 MG PO TABS
5.0000 mg | ORAL_TABLET | Freq: Once | ORAL | Status: AC
  Administered 2018-07-24: 5 mg via ORAL

## 2018-07-24 MED ORDER — LIDOCAINE 2% (20 MG/ML) 5 ML SYRINGE
INTRAMUSCULAR | Status: AC
  Filled 2018-07-24: qty 5

## 2018-07-24 MED ORDER — FENTANYL CITRATE (PF) 100 MCG/2ML IJ SOLN
25.0000 ug | INTRAMUSCULAR | Status: DC | PRN
  Administered 2018-07-24 (×2): 50 ug via INTRAVENOUS

## 2018-07-24 MED ORDER — CHLORHEXIDINE GLUCONATE CLOTH 2 % EX PADS: 6.0000 | MEDICATED_PAD | Freq: Once | CUTANEOUS | Status: DC

## 2018-07-24 MED ORDER — MIDAZOLAM HCL 5 MG/5ML IJ SOLN
INTRAMUSCULAR | Status: DC | PRN
  Administered 2018-07-24: 2 mg via INTRAVENOUS

## 2018-07-24 MED ORDER — ONDANSETRON HCL 4 MG/2ML IJ SOLN
INTRAMUSCULAR | Status: DC | PRN
  Administered 2018-07-24: 4 mg via INTRAVENOUS

## 2018-07-24 MED ORDER — PHENYLEPHRINE 40 MCG/ML (10ML) SYRINGE FOR IV PUSH (FOR BLOOD PRESSURE SUPPORT)
PREFILLED_SYRINGE | INTRAVENOUS | Status: DC | PRN
  Administered 2018-07-24: 120 ug via INTRAVENOUS
  Administered 2018-07-24: 80 ug via INTRAVENOUS

## 2018-07-24 MED ORDER — ONDANSETRON HCL 4 MG/2ML IJ SOLN
4.0000 mg | Freq: Once | INTRAMUSCULAR | Status: AC
  Administered 2018-07-24: 4 mg via INTRAVENOUS

## 2018-07-24 MED ORDER — DEXAMETHASONE SODIUM PHOSPHATE 10 MG/ML IJ SOLN
INTRAMUSCULAR | Status: DC | PRN
  Administered 2018-07-24: 10 mg via INTRAVENOUS

## 2018-07-24 MED ORDER — ONDANSETRON HCL 4 MG/2ML IJ SOLN
INTRAMUSCULAR | Status: AC
  Filled 2018-07-24: qty 2

## 2018-07-24 MED ORDER — FENTANYL CITRATE (PF) 100 MCG/2ML IJ SOLN
INTRAMUSCULAR | Status: DC | PRN
  Administered 2018-07-24: 25 ug via INTRAVENOUS

## 2018-07-24 MED ORDER — BUPIVACAINE HCL (PF) 0.5 % IJ SOLN
INTRAMUSCULAR | Status: DC | PRN
  Administered 2018-07-24: 20 mL

## 2018-07-24 MED ORDER — ONDANSETRON HCL 4 MG/2ML IJ SOLN
INTRAMUSCULAR | Status: AC
  Administered 2018-07-24: 4 mg via INTRAVENOUS
  Filled 2018-07-24: qty 2

## 2018-07-24 MED ORDER — LACTATED RINGERS IV SOLN
INTRAVENOUS | Status: DC
  Administered 2018-07-24: 10:00:00 via INTRAVENOUS

## 2018-07-24 MED ORDER — MIDAZOLAM HCL 2 MG/2ML IJ SOLN
INTRAMUSCULAR | Status: AC
  Filled 2018-07-24: qty 2

## 2018-07-24 MED ORDER — 0.9 % SODIUM CHLORIDE (POUR BTL) OPTIME
TOPICAL | Status: DC | PRN
  Administered 2018-07-24: 1000 mL

## 2018-07-24 SURGICAL SUPPLY — 38 items
ADH SKN CLS APL DERMABOND .7 (GAUZE/BANDAGES/DRESSINGS) ×1
BLADE HEX COATED 2.75 (ELECTRODE) ×3 IMPLANT
BLADE SURG 15 STRL LF DISP TIS (BLADE) ×1 IMPLANT
BLADE SURG 15 STRL SS (BLADE) ×3
BLADE SURG SZ10 CARB STEEL (BLADE) ×3 IMPLANT
CHLORAPREP W/TINT 26ML (MISCELLANEOUS) ×3 IMPLANT
CLOSURE WOUND 1/2 X4 (GAUZE/BANDAGES/DRESSINGS)
DECANTER SPIKE VIAL GLASS SM (MISCELLANEOUS) ×2 IMPLANT
DERMABOND ADVANCED (GAUZE/BANDAGES/DRESSINGS) ×2
DERMABOND ADVANCED .7 DNX12 (GAUZE/BANDAGES/DRESSINGS) IMPLANT
DRAIN PENROSE 18X1/2 LTX STRL (DRAIN) ×1 IMPLANT
DRAPE LAPAROTOMY TRNSV 102X78 (DRAPE) ×3 IMPLANT
ELECT PENCIL ROCKER SW 15FT (MISCELLANEOUS) ×3 IMPLANT
ELECT REM PT RETURN 15FT ADLT (MISCELLANEOUS) ×3 IMPLANT
GAUZE 4X4 16PLY RFD (DISPOSABLE) IMPLANT
GAUZE SPONGE 4X4 12PLY STRL (GAUZE/BANDAGES/DRESSINGS) IMPLANT
GLOVE SURG SIGNA 7.5 PF LTX (GLOVE) ×6 IMPLANT
GOWN STRL REUS W/TWL LRG LVL3 (GOWN DISPOSABLE) ×3 IMPLANT
GOWN STRL REUS W/TWL XL LVL3 (GOWN DISPOSABLE) ×6 IMPLANT
KIT BASIN OR (CUSTOM PROCEDURE TRAY) ×3 IMPLANT
NDL HYPO 25X1 1.5 SAFETY (NEEDLE) ×1 IMPLANT
NEEDLE HYPO 22GX1.5 SAFETY (NEEDLE) IMPLANT
NEEDLE HYPO 25X1 1.5 SAFETY (NEEDLE) IMPLANT
NS IRRIG 1000ML POUR BTL (IV SOLUTION) ×3 IMPLANT
PACK BASIC VI WITH GOWN DISP (CUSTOM PROCEDURE TRAY) ×3 IMPLANT
SPONGE LAP 4X18 RFD (DISPOSABLE) ×3 IMPLANT
STRIP CLOSURE SKIN 1/2X4 (GAUZE/BANDAGES/DRESSINGS) IMPLANT
SUT MNCRL AB 4-0 PS2 18 (SUTURE) ×5 IMPLANT
SUT VIC AB 2-0 CT1 27 (SUTURE) ×3
SUT VIC AB 2-0 CT1 TAPERPNT 27 (SUTURE) IMPLANT
SUT VIC AB 3-0 54XBRD REEL (SUTURE) IMPLANT
SUT VIC AB 3-0 BRD 54 (SUTURE)
SUT VIC AB 3-0 SH 27 (SUTURE) ×6
SUT VIC AB 3-0 SH 27XBRD (SUTURE) IMPLANT
SYR BULB IRRIGATION 50ML (SYRINGE) ×2 IMPLANT
SYR CONTROL 10ML LL (SYRINGE) ×3 IMPLANT
TOWEL OR 17X26 10 PK STRL BLUE (TOWEL DISPOSABLE) ×3 IMPLANT
YANKAUER SUCT BULB TIP 10FT TU (MISCELLANEOUS) ×3 IMPLANT

## 2018-07-24 NOTE — Progress Notes (Signed)
Dr Lanetta Inch notified pt has  nausea,   And has received OXY IR as ordered by Dr Ninfa Linden.  Orders for Zofran.

## 2018-07-24 NOTE — Anesthesia Procedure Notes (Signed)
Procedure Name: LMA Insertion Date/Time: 07/24/2018 11:21 AM Performed by: Maxwell Caul, CRNA Pre-anesthesia Checklist: Patient identified, Emergency Drugs available, Suction available, Patient being monitored and Timeout performed Oxygen Delivery Method: Circle system utilized Preoxygenation: Pre-oxygenation with 100% oxygen Induction Type: IV induction LMA: LMA inserted LMA Size: 4.0 Number of attempts: 1 Tube secured with: Tape

## 2018-07-24 NOTE — Anesthesia Preprocedure Evaluation (Addendum)
Anesthesia Evaluation  Patient identified by MRN, date of birth, ID band Patient awake    Reviewed: Allergy & Precautions, NPO status , Patient's Chart, lab work & pertinent test results  History of Anesthesia Complications (+) PONV and POST - OP SPINAL HEADACHE  Airway Mallampati: I  TM Distance: >3 FB Neck ROM: Full    Dental no notable dental hx. (+) Dental Advisory Given, Teeth Intact   Pulmonary asthma , sleep apnea , COPD,    Pulmonary exam normal breath sounds clear to auscultation       Cardiovascular hypertension, Pt. on medications and Pt. on home beta blockers Normal cardiovascular exam Rhythm:Regular Rate:Normal     Neuro/Psych negative neurological ROS  negative psych ROS   GI/Hepatic GERD  Medicated,(+) Hepatitis -, B  Endo/Other  Addison's  Renal/GU negative Renal ROS  negative genitourinary   Musculoskeletal  (+) Arthritis , Fibromyalgia -Spinal cord stimulator   Abdominal   Peds negative pediatric ROS (+)  Hematology  (+) anemia ,   Anesthesia Other Findings Right breast cancer  Reproductive/Obstetrics negative OB ROS                            Anesthesia Physical Anesthesia Plan  ASA: III  Anesthesia Plan: General   Post-op Pain Management:    Induction: Intravenous  PONV Risk Score and Plan: 2 and Dexamethasone, Ondansetron and Midazolam  Airway Management Planned: LMA  Additional Equipment:   Intra-op Plan:   Post-operative Plan: Extubation in OR  Informed Consent: I have reviewed the patients History and Physical, chart, labs and discussed the procedure including the risks, benefits and alternatives for the proposed anesthesia with the patient or authorized representative who has indicated his/her understanding and acceptance.   Dental advisory given  Plan Discussed with: CRNA  Anesthesia Plan Comments:         Anesthesia Quick  Evaluation

## 2018-07-24 NOTE — Transfer of Care (Signed)
Immediate Anesthesia Transfer of Care Note  Patient: Daisy [name]  Procedure(s) Performed: EXCISION OF RIGHT CHEST WALL MASS ERAS PATHWAY (Right Chest)  Patient Location: PACU  Anesthesia Type:General  Level of Consciousness: awake, alert  and oriented  Airway & Oxygen Therapy: Patient Spontanous Breathing and Patient connected to face mask oxygen  Post-op Assessment: Report given to RN and Post -op Vital signs reviewed and stable  Post vital signs: Reviewed and stable  Last Vitals:  Vitals Value Taken Time  BP 156/96 07/24/2018 12:07 PM  Temp    Pulse 76 07/24/2018 12:13 PM  Resp 10 07/24/2018 12:13 PM  SpO2 100 % 07/24/2018 12:13 PM  Vitals shown include unvalidated device data.  Last Pain:  Vitals:   07/24/18 1010  TempSrc:   PainSc: 7       Patients Stated Pain Goal: 4 (58/59/29 2446)  Complications: No apparent anesthesia complications

## 2018-07-24 NOTE — Op Note (Addendum)
EXCISION OF RIGHT CHEST WALL MASS ERAS PATHWAY  Procedure Note  Daisy [name] 07/24/2018   Pre-op Diagnosis: Right chest wall mass, Hx of breast cancer     Post-op Diagnosis: same  Procedure(s): EXCISION OF RIGHT CHEST WALL MASS (10 cm x 3 cm) ERAS PATHWAY  Surgeon(s): Coralie Keens, MD Carlena Hurl, PA-C  Anesthesia: General  Staff:  Circulator: Rosanne Sack, RN; Hitchcock, Delight Stare, RN; Marliss Czar, RN Scrub Person: Teschner, Mindy K, CST  Estimated Blood Loss: Minimal               Specimens: sent to path  Indications: This is a 67 year old female who has had bilateral mastectomies in different years for breast cancer.  She has been having increasing nodularity along her right chest wall.  She had a moderate amount of redundant skin which is also been creating a yeast infection by a skin fold along the lower chest.  Because of this, the decision is been made to excise the chest wall mass/mastectomy scar for pathologic evaluation to a recurrent malignancy  Procedure: The patient was brought to the operating room and identified as correct patient.  She is placed upon the operating table and general anesthesia was induced.  Her right chest was prepped and draped in usual sterile fashion.  I made an elliptical transverse incision across the right chest incorporating the old mastectomy scar and the redundant skin with a scalpel.  I then excised the skin and underlying subcutaneous  tissue with electrocautery going down to the chest wall muscle.  I then undermined the flaps superiorly and inferiorly with the cautery.  I sent the skin and subtenons tissue measuring approximately 10 cm x 3 cm ) was sent  to pathology for evaluation.  Hemostasis was achieved with cautery.  I then closed the subcutaneous tissue with interrupted 3-0 and 2-0 Vicryl sutures.  The skin was then closed with a running 4-0 Monocryl.  Dermabond was then applied.  The patient tolerated procedure well.  All the  counts were correct to the procedure.  The patient was then extubated in the operating room and taken in a stable condition to the recovery room.          Daisy [name] A   Date: 07/24/2018  Time: 11:50 AM

## 2018-07-24 NOTE — Anesthesia Postprocedure Evaluation (Signed)
Anesthesia Post Note  Patient: Daisy [name]  Procedure(s) Performed: EXCISION OF RIGHT CHEST WALL MASS ERAS PATHWAY (Right Chest)     Patient location during evaluation: PACU Anesthesia Type: General Level of consciousness: awake and alert Pain management: pain level controlled Vital Signs Assessment: post-procedure vital signs reviewed and stable Respiratory status: spontaneous breathing, nonlabored ventilation, respiratory function stable and patient connected to nasal cannula oxygen Cardiovascular status: blood pressure returned to baseline and stable Postop Assessment: no apparent nausea or vomiting Anesthetic complications: no    Last Vitals:  Vitals:   07/24/18 1331 07/24/18 1410  BP: (!) 148/91 135/79  Pulse: 80 83  Resp: 16 18  Temp: (!) 36.3 C 36.6 C  SpO2: 96% 95%    Last Pain:  Vitals:   07/24/18 1415  TempSrc:   PainSc: 2                  Kailey Esquilin L Joana Nolton

## 2018-07-24 NOTE — Progress Notes (Signed)
Dr. Lanetta Inch notified that pt did not take metoprolol this morning, last dose was last night. Dr. Lanetta Inch said that they would treat in OR if necessary.  Dr. Lanetta Inch also notified that pt has spinal stimulator that is on currently. Dr. Lanetta Inch said that this was okay to have on during surgery.

## 2018-07-24 NOTE — Discharge Instructions (Signed)
[facility] °[phone] ° °MASTECTOMY: POST OP INSTRUCTIONS ° °Always review your discharge instruction sheet given to you by the facility where your surgery was performed. °IF YOU HAVE DISABILITY OR FAMILY LEAVE FORMS, YOU MUST BRING THEM TO THE OFFICE FOR PROCESSING.   °DO NOT GIVE THEM TO YOUR DOCTOR. °A prescription for pain medication may be given to you upon discharge.  Take your pain medication as prescribed, if needed.  If narcotic pain medicine is not needed, then you may take acetaminophen (Tylenol) or ibuprofen (Advil) as needed. °1. Take your usually prescribed medications unless otherwise directed. °2. If you need a refill on your pain medication, please contact your pharmacy.  They will contact our office to request authorization.  Prescriptions will not be filled after 5pm or on week-ends. °3. You should follow a light diet the first few days after arrival home, such as soup and crackers, etc.  Resume your normal diet the day after surgery. °4. Most patients will experience some swelling and bruising on the chest and underarm.  Ice packs will help.  Swelling and bruising can take several days to resolve.  °5. It is common to experience some constipation if taking pain medication after surgery.  Increasing fluid intake and taking a stool softener (such as Colace) will usually help or prevent this problem from occurring.  A mild laxative (Milk of Magnesia or Miralax) should be taken according to package instructions if there are no bowel movements after 48 hours. °6. Unless discharge instructions indicate otherwise, leave your bandage dry and in place until your next appointment in 3-5 days.  You may take a limited sponge bath.  No tube baths or showers until the drains are removed.  You may have steri-strips (small skin tapes) in place directly over the incision.  These strips should be left on the skin for 7-10 days.  If your surgeon used skin glue on the incision, you may  shower in 24 hours.  The glue will flake off over the next 2-3 weeks.  Any sutures or staples will be removed at the office during your follow-up visit. °7. DRAINS:  If you have drains in place, it is important to keep a list of the amount of drainage produced each day in your drains.  Before leaving the hospital, you should be instructed on drain care.  Call our office if you have any questions about your drains. °8. ACTIVITIES:  You may resume regular (light) daily activities beginning the next day--such as daily self-care, walking, climbing stairs--gradually increasing activities as tolerated.  You may have sexual intercourse when it is comfortable.  Refrain from any heavy lifting or straining until approved by your doctor. °a. You may drive when you are no longer taking prescription pain medication, you can comfortably wear a seatbelt, and you can safely maneuver your car and apply brakes. °b. RETURN TO WORK:  __________________________________________________________ °9. You should see your doctor in the office for a follow-up appointment approximately 3-5 days after your surgery.  Your doctor’s nurse will typically make your follow-up appointment when she calls you with your pathology report.  Expect your pathology report 2-3 business days after your surgery.  You may call to check if you do not hear from us after three days.   °10. OTHER INSTRUCTIONS: ______________________________________________________________________________________________ ____________________________________________________________________________________________ °WHEN TO CALL YOUR DOCTOR: °1. Fever over 101.0 °2. Nausea and/or vomiting °3. Extreme swelling or bruising °4. Continued bleeding from incision. °5. Increased pain, redness, or drainage from the incision. °  The clinic staff is available to answer your questions during regular business hours.  Please don’t hesitate to call and ask to speak to one of the nurses for clinical  concerns.  If you have a medical emergency, go to the nearest emergency room or call 911.  A surgeon from [facility] is always on call at the hospital. °[address], [address], [place], [place]  [address] ? [address] [address], , [place]   [address] °[phone] ? [phone] ? FAX [phone] °Web site: [email or web address] °

## 2018-07-24 NOTE — Interval H&P Note (Signed)
History and Physical Interval Note: no change in H and P  07/24/2018 10:41 AM  Daisy [name]  has presented today for surgery, with the diagnosis of Right chest wall mass, Hx of breast cancer  The various methods of treatment have been discussed with the patient and family. After consideration of risks, benefits and other options for treatment, the patient has consented to  Procedure(s): EXCISION OF RIGHT CHEST [place] (N/A) as a surgical intervention .  The patient's history has been reviewed, patient examined, no change in status, stable for surgery.  I have reviewed the patient's chart and labs.  Questions were answered to the patient's satisfaction.     Cosandra Plouffe A

## 2018-07-25 ENCOUNTER — Encounter (HOSPITAL_COMMUNITY): Payer: Self-pay | Admitting: Surgery

## 2018-08-14 DIAGNOSIS — G35 Multiple sclerosis: Secondary | ICD-10-CM | POA: Diagnosis not present

## 2018-08-14 DIAGNOSIS — H01025 Squamous blepharitis left lower eyelid: Secondary | ICD-10-CM | POA: Diagnosis not present

## 2018-08-14 DIAGNOSIS — H01022 Squamous blepharitis right lower eyelid: Secondary | ICD-10-CM | POA: Diagnosis not present

## 2018-08-14 DIAGNOSIS — B029 Zoster without complications: Secondary | ICD-10-CM | POA: Diagnosis not present

## 2018-08-14 DIAGNOSIS — H01021 Squamous blepharitis right upper eyelid: Secondary | ICD-10-CM | POA: Diagnosis not present

## 2018-08-14 DIAGNOSIS — H40033 Anatomical narrow angle, bilateral: Secondary | ICD-10-CM | POA: Diagnosis not present

## 2018-08-14 DIAGNOSIS — H01024 Squamous blepharitis left upper eyelid: Secondary | ICD-10-CM | POA: Diagnosis not present

## 2018-08-27 DIAGNOSIS — Z79891 Long term (current) use of opiate analgesic: Secondary | ICD-10-CM | POA: Diagnosis not present

## 2018-08-27 DIAGNOSIS — M47812 Spondylosis without myelopathy or radiculopathy, cervical region: Secondary | ICD-10-CM | POA: Diagnosis not present

## 2018-08-27 DIAGNOSIS — G894 Chronic pain syndrome: Secondary | ICD-10-CM | POA: Diagnosis not present

## 2018-08-27 DIAGNOSIS — M4726 Other spondylosis with radiculopathy, lumbar region: Secondary | ICD-10-CM | POA: Diagnosis not present

## 2018-09-12 DIAGNOSIS — E039 Hypothyroidism, unspecified: Secondary | ICD-10-CM | POA: Diagnosis not present

## 2018-09-12 DIAGNOSIS — E230 Hypopituitarism: Secondary | ICD-10-CM | POA: Diagnosis not present

## 2018-09-12 DIAGNOSIS — M810 Age-related osteoporosis without current pathological fracture: Secondary | ICD-10-CM | POA: Diagnosis not present

## 2018-09-12 DIAGNOSIS — K219 Gastro-esophageal reflux disease without esophagitis: Secondary | ICD-10-CM | POA: Diagnosis not present

## 2018-09-12 DIAGNOSIS — Z8781 Personal history of (healed) traumatic fracture: Secondary | ICD-10-CM | POA: Diagnosis not present

## 2018-10-22 DIAGNOSIS — G894 Chronic pain syndrome: Secondary | ICD-10-CM | POA: Diagnosis not present

## 2018-10-22 DIAGNOSIS — Z79891 Long term (current) use of opiate analgesic: Secondary | ICD-10-CM | POA: Diagnosis not present

## 2018-10-22 DIAGNOSIS — M4726 Other spondylosis with radiculopathy, lumbar region: Secondary | ICD-10-CM | POA: Diagnosis not present

## 2018-10-22 DIAGNOSIS — M47812 Spondylosis without myelopathy or radiculopathy, cervical region: Secondary | ICD-10-CM | POA: Diagnosis not present

## 2018-10-23 DIAGNOSIS — H5710 Ocular pain, unspecified eye: Secondary | ICD-10-CM | POA: Diagnosis not present

## 2018-10-23 DIAGNOSIS — I10 Essential (primary) hypertension: Secondary | ICD-10-CM | POA: Diagnosis not present

## 2018-11-15 ENCOUNTER — Other Ambulatory Visit: Payer: Self-pay | Admitting: Gastroenterology

## 2018-11-20 DIAGNOSIS — M47812 Spondylosis without myelopathy or radiculopathy, cervical region: Secondary | ICD-10-CM | POA: Diagnosis not present

## 2018-11-20 DIAGNOSIS — Z79891 Long term (current) use of opiate analgesic: Secondary | ICD-10-CM | POA: Diagnosis not present

## 2018-11-20 DIAGNOSIS — G894 Chronic pain syndrome: Secondary | ICD-10-CM | POA: Diagnosis not present

## 2018-11-20 DIAGNOSIS — M4726 Other spondylosis with radiculopathy, lumbar region: Secondary | ICD-10-CM | POA: Diagnosis not present

## 2018-11-22 ENCOUNTER — Other Ambulatory Visit: Payer: Self-pay | Admitting: Gastroenterology

## 2018-11-25 ENCOUNTER — Encounter (HOSPITAL_COMMUNITY): Payer: Self-pay | Admitting: *Deleted

## 2018-11-25 ENCOUNTER — Other Ambulatory Visit: Payer: Self-pay

## 2018-12-05 ENCOUNTER — Encounter (HOSPITAL_COMMUNITY): Payer: Self-pay | Admitting: Emergency Medicine

## 2018-12-05 ENCOUNTER — Encounter (HOSPITAL_COMMUNITY)
Admission: RE | Disposition: A | Discharge status: Home / Self Care | Payer: Self-pay | Source: Home / Self Care | Attending: Gastroenterology

## 2018-12-05 ENCOUNTER — Ambulatory Visit (HOSPITAL_COMMUNITY)
Admission: RE | Admit: 2018-12-05 | Discharge: 2018-12-05 | Disposition: A | Discharge status: Home / Self Care | Payer: Medicare Other | Attending: Gastroenterology | Admitting: Gastroenterology

## 2018-12-05 ENCOUNTER — Other Ambulatory Visit: Payer: Self-pay

## 2018-12-05 ENCOUNTER — Ambulatory Visit (HOSPITAL_COMMUNITY): Payer: Medicare Other | Admitting: Anesthesiology

## 2018-12-05 DIAGNOSIS — G4733 Obstructive sleep apnea (adult) (pediatric): Secondary | ICD-10-CM | POA: Diagnosis not present

## 2018-12-05 DIAGNOSIS — R1310 Dysphagia, unspecified: Secondary | ICD-10-CM | POA: Insufficient documentation

## 2018-12-05 DIAGNOSIS — Z79899 Other long term (current) drug therapy: Secondary | ICD-10-CM | POA: Diagnosis not present

## 2018-12-05 DIAGNOSIS — Z881 Allergy status to other antibiotic agents status: Secondary | ICD-10-CM | POA: Diagnosis not present

## 2018-12-05 DIAGNOSIS — Z853 Personal history of malignant neoplasm of breast: Secondary | ICD-10-CM | POA: Diagnosis not present

## 2018-12-05 DIAGNOSIS — E039 Hypothyroidism, unspecified: Secondary | ICD-10-CM | POA: Diagnosis not present

## 2018-12-05 DIAGNOSIS — G35 Multiple sclerosis: Secondary | ICD-10-CM | POA: Diagnosis not present

## 2018-12-05 DIAGNOSIS — Z87442 Personal history of urinary calculi: Secondary | ICD-10-CM | POA: Insufficient documentation

## 2018-12-05 DIAGNOSIS — I10 Essential (primary) hypertension: Secondary | ICD-10-CM | POA: Insufficient documentation

## 2018-12-05 DIAGNOSIS — K228 Other specified diseases of esophagus: Secondary | ICD-10-CM | POA: Diagnosis not present

## 2018-12-05 DIAGNOSIS — Z880 Allergy status to penicillin: Secondary | ICD-10-CM | POA: Insufficient documentation

## 2018-12-05 DIAGNOSIS — M419 Scoliosis, unspecified: Secondary | ICD-10-CM | POA: Insufficient documentation

## 2018-12-05 DIAGNOSIS — M5136 Other intervertebral disc degeneration, lumbar region: Secondary | ICD-10-CM | POA: Diagnosis not present

## 2018-12-05 DIAGNOSIS — Z885 Allergy status to narcotic agent status: Secondary | ICD-10-CM | POA: Insufficient documentation

## 2018-12-05 DIAGNOSIS — L83 Acanthosis nigricans: Secondary | ICD-10-CM | POA: Diagnosis not present

## 2018-12-05 DIAGNOSIS — K3189 Other diseases of stomach and duodenum: Secondary | ICD-10-CM | POA: Insufficient documentation

## 2018-12-05 DIAGNOSIS — G2581 Restless legs syndrome: Secondary | ICD-10-CM | POA: Insufficient documentation

## 2018-12-05 DIAGNOSIS — J449 Chronic obstructive pulmonary disease, unspecified: Secondary | ICD-10-CM | POA: Diagnosis not present

## 2018-12-05 DIAGNOSIS — E271 Primary adrenocortical insufficiency: Secondary | ICD-10-CM | POA: Diagnosis not present

## 2018-12-05 DIAGNOSIS — K219 Gastro-esophageal reflux disease without esophagitis: Secondary | ICD-10-CM | POA: Diagnosis not present

## 2018-12-05 DIAGNOSIS — M797 Fibromyalgia: Secondary | ICD-10-CM | POA: Insufficient documentation

## 2018-12-05 DIAGNOSIS — Z888 Allergy status to other drugs, medicaments and biological substances status: Secondary | ICD-10-CM | POA: Diagnosis not present

## 2018-12-05 DIAGNOSIS — D509 Iron deficiency anemia, unspecified: Secondary | ICD-10-CM | POA: Diagnosis not present

## 2018-12-05 DIAGNOSIS — Z882 Allergy status to sulfonamides status: Secondary | ICD-10-CM | POA: Diagnosis not present

## 2018-12-05 HISTORY — DX: Ocular pain, right eye: H57.11

## 2018-12-05 HISTORY — PX: BOTOX INJECTION: SHX5754

## 2018-12-05 HISTORY — PX: ESOPHAGOGASTRODUODENOSCOPY: SHX5428

## 2018-12-05 HISTORY — PX: BIOPSY: SHX5522

## 2018-12-05 SURGERY — EGD (ESOPHAGOGASTRODUODENOSCOPY)
Anesthesia: Monitor Anesthesia Care

## 2018-12-05 MED ORDER — ONABOTULINUMTOXINA 100 UNITS IJ SOLR
INTRAMUSCULAR | Status: AC
  Filled 2018-12-05: qty 100

## 2018-12-05 MED ORDER — PROPOFOL 10 MG/ML IV BOLUS
INTRAVENOUS | Status: AC
  Filled 2018-12-05: qty 40

## 2018-12-05 MED ORDER — LACTATED RINGERS IV SOLN
INTRAVENOUS | Status: DC
  Administered 2018-12-05: 09:00:00 via INTRAVENOUS

## 2018-12-05 MED ORDER — SODIUM CHLORIDE (PF) 0.9 % IJ SOLN
INTRAMUSCULAR | Status: AC
  Filled 2018-12-05: qty 10

## 2018-12-05 MED ORDER — ONABOTULINUMTOXINA 100 UNITS IJ SOLR
INTRAMUSCULAR | Status: DC | PRN
  Administered 2018-12-05: 100 [IU] via INTRADERMAL

## 2018-12-05 MED ORDER — SODIUM CHLORIDE 0.9 % IV SOLN: INTRAVENOUS | Status: DC

## 2018-12-05 MED ORDER — PROPOFOL 10 MG/ML IV BOLUS
INTRAVENOUS | Status: DC | PRN
  Administered 2018-12-05 (×2): 20 mg via INTRAVENOUS
  Administered 2018-12-05: 50 mg via INTRAVENOUS
  Administered 2018-12-05 (×3): 20 mg via INTRAVENOUS

## 2018-12-05 MED ORDER — LABETALOL HCL 5 MG/ML IV SOLN
INTRAVENOUS | Status: DC | PRN
  Administered 2018-12-05: 2.5 mg via INTRAVENOUS

## 2018-12-05 NOTE — Anesthesia Preprocedure Evaluation (Addendum)
Anesthesia Evaluation  Patient identified by MRN, date of birth, ID band Patient awake    Reviewed: Allergy & Precautions, NPO status , Patient's Chart, lab work & pertinent test results  History of Anesthesia Complications (+) PONV, POST - OP SPINAL HEADACHE and history of anesthetic complications  Airway Mallampati: II  TM Distance: >3 FB Neck ROM: Full    Dental  (+) Dental Advisory Given, Partial Upper   Pulmonary shortness of breath, asthma , sleep apnea and Continuous Positive Airway Pressure Ventilation ,    Pulmonary exam normal breath sounds clear to auscultation       Cardiovascular hypertension, Pt. on medications Normal cardiovascular exam Rhythm:Regular Rate:Normal     Neuro/Psych  Headaches, Multiple sclerosis     GI/Hepatic hiatal hernia, GERD  ,(+) Hepatitis -, BH/o esophageal spasm Dysphagia    Endo/Other  Hypothyroidism Addison's disease   Renal/GU negative Renal ROS     Musculoskeletal  (+) Arthritis , Fibromyalgia -, narcotic dependent  Abdominal   Peds  Hematology negative hematology ROS (+)   Anesthesia Other Findings Day of surgery medications reviewed with the patient.  H/o breast cancer  Reproductive/Obstetrics                            Anesthesia Physical Anesthesia Plan  ASA: III  Anesthesia Plan: MAC   Post-op Pain Management:    Induction: Intravenous  PONV Risk Score and Plan: 3 and Propofol infusion and Treatment may vary due to age or medical condition  Airway Management Planned: Nasal Cannula and Natural Airway  Additional Equipment:   Intra-op Plan:   Post-operative Plan:   Informed Consent: I have reviewed the patients History and Physical, chart, labs and discussed the procedure including the risks, benefits and alternatives for the proposed anesthesia with the patient or authorized representative who has indicated his/her  understanding and acceptance.   Dental advisory given  Plan Discussed with: CRNA and Anesthesiologist  Anesthesia Plan Comments:         Anesthesia Quick Evaluation

## 2018-12-05 NOTE — Op Note (Signed)
[facility] Patient Name: Daisy [name] Procedure Date: 12/05/2018 MRN: [id number] Attending MD: Ronnette Juniper , MD Date of Birth: 1951-10-16 CSN: [id number] Age: 68 Admit Type: Outpatient Procedure:                Upper GI endoscopy Indications:              Dysphagia, GE junction outflow obstruction noted on                            esophageal manometry- not typical of achalasia Providers:                Ronnette Juniper, MD, Dorise Hiss, RN, Burtis Junes, RN,                            Tinnie Gens, Technician, Glenis Smoker, CRNA Referring MD:              Medicines:                Monitored Anesthesia Care Complications:            No immediate complications. Estimated blood loss:                            Minimal. Estimated Blood Loss:     Estimated blood loss was minimal. Procedure:                Pre-Anesthesia Assessment:                           - Prior to the procedure, a History and Physical                            was performed, and patient medications and                            allergies were reviewed. The patient's tolerance of                            previous anesthesia was also reviewed. The risks                            and benefits of the procedure and the sedation                            options and risks were discussed with the patient.                            All questions were answered, and informed consent                            was obtained. Prior Anticoagulants: The patient has                            taken no previous anticoagulant or antiplatelet  agents. ASA Grade Assessment: III - A patient with                            severe systemic disease. After reviewing the risks                            and benefits, the patient was deemed in                            satisfactory condition to undergo the procedure.                           After obtaining informed consent, the endoscope was                       passed under direct vision. Throughout the                            procedure, the patient's blood pressure, pulse, and                            oxygen saturations were monitored continuously. The                            GIF-H190 ([id number]) Olympus adult endoscope was                            introduced through the mouth, and advanced to the                            second part of duodenum. The upper GI endoscopy was                            accomplished without difficulty. The patient                            tolerated the procedure well. Scope In: Scope Out: Findings:      Localized moderate mucosal changes characterized by sloughing and       altered texture typical of esophgeal dissecans were found in the lower       third of the esophagus. Biopsies were taken with a cold forceps for       histology.      The Z-line was regular and was found 35 cm from the incisors. Area was       successfully injected with 100 units botulinum toxin, 25 units/ml, 31ml       in each quadrant, circumferentially, 1 cm above the GE junction.      A deformity was found at the pylorus.      Localized moderately erythematous mucosa without bleeding was found in       the gastric antrum.      The cardia and gastric fundus were normal on retroflexion.      The examined duodenum was normal. Impression:               - Texture changed mucosa in the esophagus. Biopsied.                           -  Z-line regular, 35 cm from the incisors. Injected                            with botulinum toxin.                           - Acquired deformity in the pylorus.                           - Erythematous mucosa in the antrum.                           - Normal examined duodenum. Moderate Sedation:      Patient did not receive moderate sedation for this procedure, but       instead received monitored anesthesia care. Recommendation:           - Patient has a contact number available  for                            emergencies. The signs and symptoms of potential                            delayed complications were discussed with the                            patient. Return to normal activities tomorrow.                            Written discharge instructions were provided to the                            patient.                           - Resume regular diet.                           - Continue present medications.                           - Await pathology results. Procedure Code(s):        --- Professional ---                           [id number], Esophagogastroduodenoscopy, flexible,                            transoral; with biopsy, single or multiple                           43236, 54, Esophagogastroduodenoscopy, flexible,                            transoral; with directed submucosal injection(s),                            any substance Diagnosis Code(s):        ---  Professional ---                           K22.8, Other specified diseases of esophagus                           K31.89, Other diseases of stomach and duodenum                           R13.10, Dysphagia, unspecified CPT copyright 2018 American Medical Association. All rights reserved. The codes documented in this report are preliminary and upon coder review may  be revised to meet current compliance requirements. Ronnette Juniper, MD 12/05/2018 9:32:08 AM This report has been signed electronically. Number of Addenda: 0

## 2018-12-05 NOTE — Brief Op Note (Signed)
12/05/2018  9:25 AM  PATIENT:  [name]  68 y.o. female  PRE-OPERATIVE DIAGNOSIS:  Difficult swallowing  POST-OPERATIVE DIAGNOSIS:  * No post-op diagnosis entered *  PROCEDURE:  Procedure(s) with comments: ESOPHAGOGASTRODUODENOSCOPY (EGD) (N/A) - w/ botox injection  SURGEON:  Surgeon(s) and Role:    Ronnette Juniper, MD - Primary  PHYSICIAN ASSISTANT:   ASSISTANTS: Burtis Junes, RN, Tinnie Gens, Tech  ANESTHESIA:   MAC  EBL:  Minimal  BLOOD ADMINISTERED:none  DRAINS: none   LOCAL MEDICATIONS USED:  NONE  SPECIMEN:  Biopsy / Limited Resection  DISPOSITION OF SPECIMEN:  PATHOLOGY  COUNTS:  YES  TOURNIQUET:  * No tourniquets in log *  DICTATION: .Dragon Dictation  PLAN OF CARE: Discharge to home after PACU  PATIENT DISPOSITION:  PACU - hemodynamically stable.   Delay start of Pharmacological VTE agent (>24hrs) due to surgical blood loss or risk of bleeding: no

## 2018-12-05 NOTE — Transfer of Care (Signed)
Immediate Anesthesia Transfer of Care Note  Patient: Daisy [name]  Procedure(s) Performed: ESOPHAGOGASTRODUODENOSCOPY (EGD) (N/A ) BOTOX INJECTION BIOPSY  Patient Location: PACU and Endoscopy Unit  Anesthesia Type:MAC  Level of Consciousness: awake and alert   Airway & Oxygen Therapy: Patient Spontanous Breathing and Patient connected to nasal cannula oxygen  Post-op Assessment: Report given to RN and Post -op Vital signs reviewed and stable  Post vital signs: Reviewed and stable  Last Vitals:  Vitals Value Taken Time  BP    Temp    Pulse 81 12/05/2018  9:37 AM  Resp 17 12/05/2018  9:37 AM  SpO2 98 % 12/05/2018  9:37 AM  Vitals shown include unvalidated device data.  Last Pain:  Vitals:   12/05/18 0815  TempSrc: Oral         Complications: No apparent anesthesia complications

## 2018-12-05 NOTE — Discharge Instructions (Signed)

## 2018-12-05 NOTE — Anesthesia Procedure Notes (Signed)
Procedure Name: MAC Date/Time: 12/05/2018 9:09 AM Performed by: Cynda Familia, CRNA Pre-anesthesia Checklist: Patient identified, Emergency Drugs available, Suction available, Timeout performed and Patient being monitored Patient Re-evaluated:Patient Re-evaluated prior to induction Oxygen Delivery Method: Nasal cannula Placement Confirmation: positive ETCO2 and breath sounds checked- equal and bilateral Dental Injury: Teeth and Oropharynx as per pre-operative assessment  Comments: Bite block by RN

## 2018-12-05 NOTE — Anesthesia Postprocedure Evaluation (Signed)
Anesthesia Post Note  Patient: Daisy [name]  Procedure(s) Performed: ESOPHAGOGASTRODUODENOSCOPY (EGD) (N/A ) BOTOX INJECTION BIOPSY     Patient location during evaluation: Endoscopy Anesthesia Type: MAC Level of consciousness: awake and alert Pain management: pain level controlled Vital Signs Assessment: post-procedure vital signs reviewed and stable Respiratory status: spontaneous breathing, nonlabored ventilation, respiratory function stable and patient connected to nasal cannula oxygen Cardiovascular status: stable and blood pressure returned to baseline Postop Assessment: no apparent nausea or vomiting Anesthetic complications: no    Last Vitals:  Vitals:   12/05/18 0950 12/05/18 0955  BP: (!) 159/90 (!) 161/88  Pulse: 78 81  Resp: 17 13  Temp:    SpO2: 92% 94%    Last Pain:  Vitals:   12/05/18 0955  TempSrc:   PainSc: 0-No pain                 Catalina Gravel

## 2018-12-05 NOTE — H&P (Signed)
Daisy [name] is an 68 y.o. female.   Chief Complaint: Dysphasia HPI: 68 year old female with ongoing dysphasia, intermittent, non progressive,history of  esophageal dessicans, prior EGD with esophageal biopsies and balloon dilatation without relief of symptoms. She has done well with EGD and Botox injection in the past, last performed in 7/19.  Past Medical History:  Diagnosis Date  . Addison's disease ([place])   . Adenocarcinoma of breast ([place]) 07/13/1992   LEFT LUMPECTOMY AND CHEMO AND RADIATION;   NOW HAS RIGHT BREAST MASS 09/25/14 - PLANS MASTECTOMY   . Adrenal insufficiency ([place])    "w/secondary Addison's"  . Anemia   . Arthritis   . Asthma    using incentive spirometer alot now  . Brittle bone disease   . Bruises easily   . Chronic bronchitis   . Chronic pain    DR. MARK PHILLIPS WITH PAIN CLINIC -  CHRONIC BACK PAIN AND SCOLIOSIS AND PREVIOUS BACK SURGERY AND NOW LUMBAR FRACTURE - PT HAS [employer] SPINAL STIMULATOR WHICH SHE CAN TURN ON AND OFF ; PT ON MORPINE AND CLONAZEPAM AND ROBAXIN  . COPD (chronic obstructive pulmonary disease) ([place])   . DDD (degenerative disc disease), lumbar   . Eye pain, right    last 4 months can be both eyes  . Falls frequently   . Family history of adverse reaction to anesthesia    ponv  . Fibromyalgia   . Fracture    LUMBAR 1 - FROM A FALL JUNE 2015 - PT HAS A LOT OF SEVERE BACK PAIN AND DIFFICULTY WALKING  . GERD (gastroesophageal reflux disease)   . H/O esophageal spasm    "I can have as many as 17 TUMS before they stop"  . H/O hiatal hernia   . Head injury with loss of consciousness ([place])   . Headache(784.0)    "when I don't eat" (02/19/12)  . 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 History of kidney stones    1 large stone now  . History of urinary urgency    AZO HELPS - DOES TURN URINE ORANGE; DR. Risa Cardin IS PT'S UROLOGIST  . Hypertension   . Hypothyroidism   . Mass    right chest wall   . Migraines    "much better now" (02/19/12)  . Multiple sclerosis ([place]) 05/1988   Relasping and Remitting"  . OSA (obstructive sleep apnea)   . 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 Pneumonia   . PONV (postoperative nausea and vomiting)    one episode 1993 vomiting after surgery  . Poor venous access    PT STATES HER PORT A CATH NOT TO BE USED FOR SURGERY/ IV'S  . Positive TB test 2017   "ran deep in my father's family; went on INH for awhile; it caused migraines"  . Radiation adverse effect    "100% blockage on left; 50% on right salivary; can drain w/massage"  . Restless leg syndrome, controlled 02/19/12   "much better since being on CPAP; been on that ~ 1 month now"  . Shingles   . Shortness of breath dyspnea    with exertion, improved, asthma improved  . Sleep apnea    no cpap used due to pt took off all the time   . Spinal headache   . Stress incontinence in female   . Upper GI bleeding 1970-1980   "had 4"    Past Surgical History:  Procedure Laterality  Date  . ABDOMINAL HYSTERECTOMY  1987  . APPENDECTOMY  1960's  . BACK SURGERY  ~ 2004   "chipped disk out to put in permanent stimulator"  . BAND HEMORRHOIDECTOMY  1979  . BILATERAL OOPHORECTOMY  1988  . BIOPSY  06/11/2018   Procedure: BIOPSY;  Surgeon: Ronnette Juniper, MD;  Location: [facility];  Service: Gastroenterology;;  . Lum [place] INJECTION N/A 11/26/2017   Procedure: BOTOX INJECTION;  Surgeon: Ronnette Juniper, MD;  Location: [facility];  Service: Gastroenterology;  Laterality: N/A;  . BOTOX INJECTION N/A 06/11/2018   Procedure: BOTOX INJECTION;  Surgeon: Ronnette Juniper, MD;  Location: [facility];  Service: Gastroenterology;  Laterality: N/A;  . BREAST LUMPECTOMY  07/13/1992   left   . CHOLECYSTECTOMY  1980's  . COLONOSCOPY    . CYSTOSCOPY WITH RETROGRADE PYELOGRAM, URETEROSCOPY AND STENT PLACEMENT Right 09/29/2015   Procedure: CYSTOSCOPY WITH RIGHT RETROGRADE PYELOGRAM, URETEROSCOPY AND STENT PLACEMENT, STONE  EXTRACTION WITH BASKET;  Surgeon: Rana Snare, MD;  Location: [facility];  Service: Urology;  Laterality: Right;  . ESOPHAGEAL MANOMETRY N/A 10/03/2017   Procedure: ESOPHAGEAL MANOMETRY (EM);  Surgeon: Ronnette Juniper, MD;  Location: [facility];  Service: Gastroenterology;  Laterality: N/A;  . ESOPHAGOGASTRODUODENOSCOPY N/A 04/17/2014   Procedure: ESOPHAGOGASTRODUODENOSCOPY (EGD);  Surgeon: Missy Sabins, MD;  Location: [facility];  Service: Endoscopy;  Laterality: N/A;  . ESOPHAGOGASTRODUODENOSCOPY N/A 11/26/2017   Procedure: ESOPHAGOGASTRODUODENOSCOPY (EGD);  Surgeon: Ronnette Juniper, MD;  Location: [facility];  Service: Gastroenterology;  Laterality: N/A;  . ESOPHAGOGASTRODUODENOSCOPY N/A 06/11/2018   Procedure: ESOPHAGOGASTRODUODENOSCOPY (EGD);  Surgeon: Ronnette Juniper, MD;  Location: [facility];  Service: Gastroenterology;  Laterality: N/A;  . EXTRACORPOREAL SHOCK WAVE LITHOTRIPSY  2016  . fracture Left 03/2018   left foot fracture  . HOLMIUM LASER APPLICATION Right 73/41/9379   Procedure: HOLMIUM LASER LITHOTRIPSY ;  Surgeon: Rana Snare, MD;  Location: [facility];  Service: Urology;  Laterality: Right;  . LAPAROSCOPY    . MASS EXCISION Right 07/24/2018   Procedure: EXCISION OF RIGHT CHEST WALL MASS ERAS PATHWAY;  Surgeon: Coralie Keens, MD;  Location: [facility];  Service: General;  Laterality: Right;  . MASTECTOMY  2010   left   . MASTECTOMY Right 10/15  . PORT-A-CATH REMOVAL Right 09/25/2016   Procedure: REMOVAL PORT-A-CATH RIGHT;  Surgeon: Jackolyn Confer, MD;  Location: [facility];  Service: General;  Laterality: Right;  . PORTACATH PLACEMENT  10/30/2012   Procedure: INSERTION PORT-A-CATH;  Surgeon: Odis Hollingshead, MD;  Location: [facility];  Service: General;  Laterality: Right;  Right Ultrasound guided port-a-cath insertion  . SALIVARY GLAND SURGERY  ~ 2002   "left was 100% blocked twice; right 50%; only had one surgery"  . SALIVARY GLAND SURGERY LEFT    . SAVORY DILATION N/A 04/17/2014    Procedure: SAVORY DILATION;  Surgeon: Missy Sabins, MD;  Location: [facility];  Service: Endoscopy;  Laterality: N/A;  . SPINAL CORD STIMULATOR INSERTION  ~ 2004  . SPINAL CORD STIMULATOR INSERTION N/A 04/09/2015   Procedure: Spinal cord stimulator revision and replacement of paddle electrode;  Surgeon: Kristeen Miss, MD;  Location: [facility];  Service: Neurosurgery;  Laterality: N/A;  Spinal cord stimulator revision and replacement of paddle electrode  . TONSILLECTOMY AND ADENOIDECTOMY  1964  . TOTAL MASTECTOMY Right 09/25/2014   Procedure: RIGHT MASTECTOMY;  Surgeon: Jackolyn Confer, MD;  Location: [facility];  Service: General;  Laterality: Right;    Family History  Problem Relation Age of Onset  . Lung cancer Paternal  Uncle        smoker  . Breast cancer Cousin 25       maternal cousin  . Throat cancer Cousin        maternal cousin   Social History:  reports that she has never smoked. She has never used smokeless tobacco. She reports that she does not drink alcohol or use drugs.  Allergies:  Allergies  Allergen Reactions  . Fosamax [Alendronate] Other (See Comments)    ESOPHAGEAL IRRITATION DANGER OF ESOPHAGEAL PERF.  . Gabapentin Anaphylaxis  . Iodine-131 Anaphylaxis  . Iohexol Anaphylaxis and Other (See Comments)    ANAPHYLACTIC REACTION!-kmr-10/16/05   . Ivp Dye [Iodinated Diagnostic Agents] Anaphylaxis and Other (See Comments)    Fever and blood clot  . Procaine Anaphylaxis and Other (See Comments)    novocaine caused shaking, rash, chest heaviness, made patient feel sick  . Sulfa Antibiotics Swelling and Other (See Comments)    SWELLING REACTION UNSPECIFIED  HALLUCINATIONS   . Terbinafine Hcl Anaphylaxis and Other (See Comments)    THROAT CLOSED UP  . Ambien [Zolpidem] Nausea And Vomiting and Other (See Comments)    SHAKEY  . Bactrim [Sulfamethoxazole-Trimethoprim] Other (See Comments)    ALTERED MENTAL STATUS HALLUCINATIONS   . Compazine Other (See Comments)     Hyper and shaky  . Dilaudid [Hydromorphone Hcl] Nausea And Vomiting and Other (See Comments)    HEADACHES  . Guaifenesin Nausea And Vomiting, Swelling and Other (See Comments)    SWELLING REACTION UNSPECIFIED  HYPERACTIVE  . Hydromorphone Nausea And Vomiting and Other (See Comments)    HEADACHE >> MIGRAINE  . Lisinopril Cough  . Methocarbamol Other (See Comments)    "Makes pt feel irritable "  . Metoclopramide Hcl Other (See Comments)    Hyper, shaky  . Promethazine Anxiety and Other (See Comments)    DIZZINESS  . Vancomycin Swelling and Other (See Comments)    SWELLING REACTION UNSPECIFIED   . Aldara [Imiquimod] Other (See Comments)    "Active and sick "  . Amlodipine Swelling and Other (See Comments)    SWELLING REACTION (patient is currently taking with mild fluid retention)  . Clindamycin/Lincomycin Other (See Comments)    UNSPECIFIED REACTION   . Trifluoperazine Other (See Comments)    Hallucination   . Augmentin [Amoxicillin-Pot Clavulanate] Diarrhea and Other (See Comments)    Bad headaches Has patient had a PCN reaction causing immediate rash, facial/tongue/throat swelling, SOB or lightheadedness with hypotension: no Has patient had a PCN reaction causing severe rash involving mucus membranes or skin necrosis: no Has patient had a PCN reaction that required hospitalization no Has patient had a PCN reaction occurring within the last 10 years: no If all of the above answers are "NO", then may proceed with Cephalosporin use.   . Biaxin [Clarithromycin] Anxiety and Other (See Comments)    jittery  . Carafate [Sucralfate] Nausea Only and Other (See Comments)    Distends stomach.   . Guaifenesin Anxiety and Other (See Comments)    Headache  . Other Hives and Other (See Comments)    SOME TAPES CAUSE SKIN IRRITATION  . Prochlorperazine Anxiety    Medications Prior to Admission  Medication Sig Dispense Refill  . amLODipine (NORVASC) 2.5 MG tablet Take 2.5 mg by mouth  daily.    . Calcium-Vitamin D-Vitamin K (VIACTIV) [id number] MG-UNT-MCG CHEW Chew 3 each by mouth daily.     . hydrOXYzine (ATARAX/VISTARIL) 25 MG tablet Take 25-50 mg by mouth every 8 (eight)  hours as needed for nausea or vomiting.     Marland Kitchen levothyroxine (SYNTHROID, LEVOTHROID) 50 MCG tablet Take 50-75 mcg by mouth See admin instructions. Take 1 tablet (50 mcg) by mouth daily on even days alternating with 1.5 tablets (75 mcg) by mouth daily on odd days    . losartan (COZAAR) 100 MG tablet Take 100 mg by mouth every evening.   0  . morphine (MSIR) 30 MG tablet Take 30 mg by mouth every 6 (six) hours as needed (Scoliosis pain).     . pantoprazole (PROTONIX) 40 MG tablet Take 40 mg by mouth daily.    . predniSONE (DELTASONE) 1 MG tablet Take 4 mg by mouth daily with breakfast.    . senna-docusate (SENOKOT-S) 8.6-50 MG tablet Take 1 tablet 2 (two) times daily by mouth. 60 tablet 0  . oxyCODONE (OXY IR/ROXICODONE) 5 MG immediate release tablet Take 1 tablet (5 mg total) by mouth every 6 (six) hours as needed for moderate pain or severe pain. (Patient not taking: Reported on 11/22/2018) 30 tablet 0  . valACYclovir (VALTREX) 1000 MG tablet Take 1,000 mg by mouth 3 (three) times daily as needed (for shingles).       No results found for this or any previous visit (from the past 48 hour(s)). No results found.  Review of Systems  Constitutional: Positive for malaise/fatigue.  HENT: Negative.   Eyes: Negative.   Respiratory: Negative.   Cardiovascular: Negative.   Gastrointestinal: Negative.   Skin: Negative.   Endo/Heme/Allergies: Negative.     Blood pressure (!) 149/85, pulse 91, temperature 98.2 F (36.8 C), temperature source Oral, resp. rate 20, height 5\' 2"  (1.575 m), weight 59.9 kg, SpO2 95 %. Physical Exam  Constitutional: She is oriented to person, place, and time. She appears well-developed and well-nourished.  HENT:  Head: Normocephalic and atraumatic.  Eyes: Conjunctivae are normal.   Neck: Normal range of motion. Neck supple.  Cardiovascular: Normal rate and regular rhythm.  Respiratory: Effort normal and breath sounds normal.  GI: Soft. Bowel sounds are normal.  Musculoskeletal: Normal range of motion.  Neurological: She is alert and oriented to person, place, and time.  Skin: Skin is warm and dry.  Psychiatric: She has a normal mood and affect.     Assessment/Plan Dysphagia- outflow obstruction on esophageal manometry without typical features of achalasia History of esophageal desiccans  Plan EGD with Botox injection. The risks and the benefits of the procedure were discussed with the patient in details. She understands and verbalizes consent.  Ronnette Juniper, MD 12/05/2018, 9:01 AM

## 2018-12-06 ENCOUNTER — Encounter (HOSPITAL_COMMUNITY): Payer: Self-pay | Admitting: Gastroenterology

## 2018-12-10 DIAGNOSIS — G43709 Chronic migraine without aura, not intractable, without status migrainosus: Secondary | ICD-10-CM | POA: Diagnosis not present

## 2019-01-08 DIAGNOSIS — E271 Primary adrenocortical insufficiency: Secondary | ICD-10-CM | POA: Diagnosis not present

## 2019-01-08 DIAGNOSIS — G4733 Obstructive sleep apnea (adult) (pediatric): Secondary | ICD-10-CM | POA: Diagnosis not present

## 2019-01-08 DIAGNOSIS — Z8739 Personal history of other diseases of the musculoskeletal system and connective tissue: Secondary | ICD-10-CM | POA: Diagnosis not present

## 2019-01-08 DIAGNOSIS — G43709 Chronic migraine without aura, not intractable, without status migrainosus: Secondary | ICD-10-CM | POA: Diagnosis not present

## 2019-01-08 DIAGNOSIS — G8929 Other chronic pain: Secondary | ICD-10-CM | POA: Diagnosis not present

## 2019-01-20 DIAGNOSIS — G894 Chronic pain syndrome: Secondary | ICD-10-CM | POA: Diagnosis not present

## 2019-01-20 DIAGNOSIS — Z79891 Long term (current) use of opiate analgesic: Secondary | ICD-10-CM | POA: Diagnosis not present

## 2019-01-20 DIAGNOSIS — M41115 Juvenile idiopathic scoliosis, thoracolumbar region: Secondary | ICD-10-CM | POA: Diagnosis not present

## 2019-01-20 DIAGNOSIS — M4726 Other spondylosis with radiculopathy, lumbar region: Secondary | ICD-10-CM | POA: Diagnosis not present

## 2019-02-13 DIAGNOSIS — J029 Acute pharyngitis, unspecified: Secondary | ICD-10-CM | POA: Diagnosis not present

## 2019-02-13 DIAGNOSIS — J069 Acute upper respiratory infection, unspecified: Secondary | ICD-10-CM | POA: Diagnosis not present

## 2019-03-17 DIAGNOSIS — M41115 Juvenile idiopathic scoliosis, thoracolumbar region: Secondary | ICD-10-CM | POA: Diagnosis not present

## 2019-03-17 DIAGNOSIS — Z79891 Long term (current) use of opiate analgesic: Secondary | ICD-10-CM | POA: Diagnosis not present

## 2019-03-17 DIAGNOSIS — G894 Chronic pain syndrome: Secondary | ICD-10-CM | POA: Diagnosis not present

## 2019-03-17 DIAGNOSIS — M4726 Other spondylosis with radiculopathy, lumbar region: Secondary | ICD-10-CM | POA: Diagnosis not present

## 2019-04-01 DIAGNOSIS — Z8781 Personal history of (healed) traumatic fracture: Secondary | ICD-10-CM | POA: Diagnosis not present

## 2019-04-01 DIAGNOSIS — M810 Age-related osteoporosis without current pathological fracture: Secondary | ICD-10-CM | POA: Diagnosis not present

## 2019-04-01 DIAGNOSIS — E230 Hypopituitarism: Secondary | ICD-10-CM | POA: Diagnosis not present

## 2019-04-01 DIAGNOSIS — E039 Hypothyroidism, unspecified: Secondary | ICD-10-CM | POA: Diagnosis not present

## 2019-04-01 DIAGNOSIS — K219 Gastro-esophageal reflux disease without esophagitis: Secondary | ICD-10-CM | POA: Diagnosis not present

## 2019-05-12 DIAGNOSIS — M4726 Other spondylosis with radiculopathy, lumbar region: Secondary | ICD-10-CM | POA: Diagnosis not present

## 2019-05-12 DIAGNOSIS — G894 Chronic pain syndrome: Secondary | ICD-10-CM | POA: Diagnosis not present

## 2019-05-12 DIAGNOSIS — Z79891 Long term (current) use of opiate analgesic: Secondary | ICD-10-CM | POA: Diagnosis not present

## 2019-05-12 DIAGNOSIS — M41115 Juvenile idiopathic scoliosis, thoracolumbar region: Secondary | ICD-10-CM | POA: Diagnosis not present

## 2019-06-05 DIAGNOSIS — I10 Essential (primary) hypertension: Secondary | ICD-10-CM | POA: Diagnosis not present

## 2019-06-05 DIAGNOSIS — B379 Candidiasis, unspecified: Secondary | ICD-10-CM | POA: Diagnosis not present

## 2019-06-05 DIAGNOSIS — E230 Hypopituitarism: Secondary | ICD-10-CM | POA: Diagnosis not present

## 2019-06-05 DIAGNOSIS — R112 Nausea with vomiting, unspecified: Secondary | ICD-10-CM | POA: Diagnosis not present

## 2019-06-05 DIAGNOSIS — R609 Edema, unspecified: Secondary | ICD-10-CM | POA: Diagnosis not present

## 2019-07-02 DIAGNOSIS — K210 Gastro-esophageal reflux disease with esophagitis: Secondary | ICD-10-CM | POA: Diagnosis not present

## 2019-07-02 DIAGNOSIS — R110 Nausea: Secondary | ICD-10-CM | POA: Diagnosis not present

## 2019-07-02 DIAGNOSIS — K224 Dyskinesia of esophagus: Secondary | ICD-10-CM | POA: Diagnosis not present

## 2019-07-02 DIAGNOSIS — K222 Esophageal obstruction: Secondary | ICD-10-CM | POA: Diagnosis not present

## 2019-07-02 DIAGNOSIS — R61 Generalized hyperhidrosis: Secondary | ICD-10-CM | POA: Diagnosis not present

## 2019-07-02 DIAGNOSIS — R1030 Lower abdominal pain, unspecified: Secondary | ICD-10-CM | POA: Diagnosis not present

## 2019-07-02 DIAGNOSIS — R6883 Chills (without fever): Secondary | ICD-10-CM | POA: Diagnosis not present

## 2019-07-17 DIAGNOSIS — M4726 Other spondylosis with radiculopathy, lumbar region: Secondary | ICD-10-CM | POA: Diagnosis not present

## 2019-07-17 DIAGNOSIS — M41115 Juvenile idiopathic scoliosis, thoracolumbar region: Secondary | ICD-10-CM | POA: Diagnosis not present

## 2019-07-17 DIAGNOSIS — Z79891 Long term (current) use of opiate analgesic: Secondary | ICD-10-CM | POA: Diagnosis not present

## 2019-07-17 DIAGNOSIS — G894 Chronic pain syndrome: Secondary | ICD-10-CM | POA: Diagnosis not present

## 2019-08-08 DIAGNOSIS — M810 Age-related osteoporosis without current pathological fracture: Secondary | ICD-10-CM | POA: Diagnosis not present

## 2019-08-08 DIAGNOSIS — R509 Fever, unspecified: Secondary | ICD-10-CM | POA: Diagnosis not present

## 2019-08-08 DIAGNOSIS — J0100 Acute maxillary sinusitis, unspecified: Secondary | ICD-10-CM | POA: Diagnosis not present

## 2019-08-08 DIAGNOSIS — H5789 Other specified disorders of eye and adnexa: Secondary | ICD-10-CM | POA: Diagnosis not present

## 2019-08-08 DIAGNOSIS — G35 Multiple sclerosis: Secondary | ICD-10-CM | POA: Diagnosis not present

## 2019-08-08 DIAGNOSIS — R05 Cough: Secondary | ICD-10-CM | POA: Diagnosis not present

## 2019-08-12 DIAGNOSIS — R112 Nausea with vomiting, unspecified: Secondary | ICD-10-CM | POA: Diagnosis not present

## 2019-08-12 DIAGNOSIS — J029 Acute pharyngitis, unspecified: Secondary | ICD-10-CM | POA: Diagnosis not present

## 2019-08-12 DIAGNOSIS — R509 Fever, unspecified: Secondary | ICD-10-CM | POA: Diagnosis not present

## 2019-08-14 DIAGNOSIS — Z20828 Contact with and (suspected) exposure to other viral communicable diseases: Secondary | ICD-10-CM | POA: Diagnosis not present

## 2019-08-29 DIAGNOSIS — J45909 Unspecified asthma, uncomplicated: Secondary | ICD-10-CM | POA: Diagnosis not present

## 2019-08-29 DIAGNOSIS — Z853 Personal history of malignant neoplasm of breast: Secondary | ICD-10-CM | POA: Diagnosis not present

## 2019-08-29 DIAGNOSIS — I10 Essential (primary) hypertension: Secondary | ICD-10-CM | POA: Diagnosis not present

## 2019-08-29 DIAGNOSIS — M810 Age-related osteoporosis without current pathological fracture: Secondary | ICD-10-CM | POA: Diagnosis not present

## 2019-08-29 DIAGNOSIS — E039 Hypothyroidism, unspecified: Secondary | ICD-10-CM | POA: Diagnosis not present

## 2019-09-02 DIAGNOSIS — J029 Acute pharyngitis, unspecified: Secondary | ICD-10-CM | POA: Diagnosis not present

## 2019-09-02 DIAGNOSIS — J3489 Other specified disorders of nose and nasal sinuses: Secondary | ICD-10-CM | POA: Diagnosis not present

## 2019-09-02 DIAGNOSIS — K219 Gastro-esophageal reflux disease without esophagitis: Secondary | ICD-10-CM | POA: Diagnosis not present

## 2019-09-02 DIAGNOSIS — G44009 Cluster headache syndrome, unspecified, not intractable: Secondary | ICD-10-CM | POA: Diagnosis not present

## 2019-09-02 DIAGNOSIS — G35 Multiple sclerosis: Secondary | ICD-10-CM | POA: Diagnosis not present

## 2019-09-02 DIAGNOSIS — K068 Other specified disorders of gingiva and edentulous alveolar ridge: Secondary | ICD-10-CM | POA: Diagnosis not present

## 2019-09-05 ENCOUNTER — Other Ambulatory Visit: Payer: Self-pay

## 2019-09-05 ENCOUNTER — Emergency Department (HOSPITAL_BASED_OUTPATIENT_CLINIC_OR_DEPARTMENT_OTHER): Payer: Medicare Other

## 2019-09-05 ENCOUNTER — Inpatient Hospital Stay (HOSPITAL_BASED_OUTPATIENT_CLINIC_OR_DEPARTMENT_OTHER)
Admission: EM | Admit: 2019-09-05 | Discharge: 2019-09-10 | DRG: 392 | Disposition: A | Discharge status: Home / Self Care | Payer: Medicare Other | Attending: Internal Medicine | Admitting: Internal Medicine

## 2019-09-05 ENCOUNTER — Encounter (HOSPITAL_BASED_OUTPATIENT_CLINIC_OR_DEPARTMENT_OTHER): Payer: Self-pay | Admitting: Adult Health

## 2019-09-05 DIAGNOSIS — Z9221 Personal history of antineoplastic chemotherapy: Secondary | ICD-10-CM

## 2019-09-05 DIAGNOSIS — G35 Multiple sclerosis: Secondary | ICD-10-CM | POA: Diagnosis present

## 2019-09-05 DIAGNOSIS — G8929 Other chronic pain: Secondary | ICD-10-CM | POA: Diagnosis present

## 2019-09-05 DIAGNOSIS — Z7952 Long term (current) use of systemic steroids: Secondary | ICD-10-CM

## 2019-09-05 DIAGNOSIS — R296 Repeated falls: Secondary | ICD-10-CM | POA: Diagnosis present

## 2019-09-05 DIAGNOSIS — Z831 Family history of other infectious and parasitic diseases: Secondary | ICD-10-CM

## 2019-09-05 DIAGNOSIS — K224 Dyskinesia of esophagus: Secondary | ICD-10-CM | POA: Diagnosis not present

## 2019-09-05 DIAGNOSIS — K222 Esophageal obstruction: Secondary | ICD-10-CM | POA: Diagnosis not present

## 2019-09-05 DIAGNOSIS — Z881 Allergy status to other antibiotic agents status: Secondary | ICD-10-CM

## 2019-09-05 DIAGNOSIS — Z91041 Radiographic dye allergy status: Secondary | ICD-10-CM

## 2019-09-05 DIAGNOSIS — Z79899 Other long term (current) drug therapy: Secondary | ICD-10-CM

## 2019-09-05 DIAGNOSIS — Q780 Osteogenesis imperfecta: Secondary | ICD-10-CM

## 2019-09-05 DIAGNOSIS — G4733 Obstructive sleep apnea (adult) (pediatric): Secondary | ICD-10-CM | POA: Diagnosis present

## 2019-09-05 DIAGNOSIS — I313 Pericardial effusion (noninflammatory): Secondary | ICD-10-CM | POA: Diagnosis not present

## 2019-09-05 DIAGNOSIS — K219 Gastro-esophageal reflux disease without esophagitis: Secondary | ICD-10-CM | POA: Diagnosis present

## 2019-09-05 DIAGNOSIS — R4702 Dysphasia: Secondary | ICD-10-CM | POA: Diagnosis present

## 2019-09-05 DIAGNOSIS — Z888 Allergy status to other drugs, medicaments and biological substances status: Secondary | ICD-10-CM

## 2019-09-05 DIAGNOSIS — R0602 Shortness of breath: Secondary | ICD-10-CM

## 2019-09-05 DIAGNOSIS — I319 Disease of pericardium, unspecified: Secondary | ICD-10-CM | POA: Diagnosis present

## 2019-09-05 DIAGNOSIS — I10 Essential (primary) hypertension: Secondary | ICD-10-CM | POA: Diagnosis present

## 2019-09-05 DIAGNOSIS — E039 Hypothyroidism, unspecified: Secondary | ICD-10-CM | POA: Diagnosis present

## 2019-09-05 DIAGNOSIS — R05 Cough: Secondary | ICD-10-CM | POA: Diagnosis not present

## 2019-09-05 DIAGNOSIS — K449 Diaphragmatic hernia without obstruction or gangrene: Secondary | ICD-10-CM | POA: Diagnosis present

## 2019-09-05 DIAGNOSIS — Z923 Personal history of irradiation: Secondary | ICD-10-CM

## 2019-09-05 DIAGNOSIS — E271 Primary adrenocortical insufficiency: Secondary | ICD-10-CM | POA: Diagnosis present

## 2019-09-05 DIAGNOSIS — G2581 Restless legs syndrome: Secondary | ICD-10-CM | POA: Diagnosis present

## 2019-09-05 DIAGNOSIS — R079 Chest pain, unspecified: Secondary | ICD-10-CM | POA: Diagnosis present

## 2019-09-05 DIAGNOSIS — B029 Zoster without complications: Secondary | ICD-10-CM | POA: Diagnosis present

## 2019-09-05 DIAGNOSIS — Z853 Personal history of malignant neoplasm of breast: Secondary | ICD-10-CM

## 2019-09-05 DIAGNOSIS — Z882 Allergy status to sulfonamides status: Secondary | ICD-10-CM

## 2019-09-05 DIAGNOSIS — Z90710 Acquired absence of both cervix and uterus: Secondary | ICD-10-CM

## 2019-09-05 DIAGNOSIS — Z9013 Acquired absence of bilateral breasts and nipples: Secondary | ICD-10-CM

## 2019-09-05 DIAGNOSIS — Z20828 Contact with and (suspected) exposure to other viral communicable diseases: Secondary | ICD-10-CM | POA: Diagnosis present

## 2019-09-05 DIAGNOSIS — R072 Precordial pain: Secondary | ICD-10-CM | POA: Diagnosis not present

## 2019-09-05 DIAGNOSIS — Z90722 Acquired absence of ovaries, bilateral: Secondary | ICD-10-CM

## 2019-09-05 DIAGNOSIS — K317 Polyp of stomach and duodenum: Secondary | ICD-10-CM | POA: Diagnosis present

## 2019-09-05 DIAGNOSIS — M797 Fibromyalgia: Secondary | ICD-10-CM | POA: Diagnosis present

## 2019-09-05 DIAGNOSIS — Z87442 Personal history of urinary calculi: Secondary | ICD-10-CM

## 2019-09-05 DIAGNOSIS — Z801 Family history of malignant neoplasm of trachea, bronchus and lung: Secondary | ICD-10-CM

## 2019-09-05 DIAGNOSIS — R509 Fever, unspecified: Secondary | ICD-10-CM | POA: Diagnosis not present

## 2019-09-05 DIAGNOSIS — R7611 Nonspecific reaction to tuberculin skin test without active tuberculosis: Secondary | ICD-10-CM | POA: Diagnosis present

## 2019-09-05 DIAGNOSIS — Z885 Allergy status to narcotic agent status: Secondary | ICD-10-CM

## 2019-09-05 DIAGNOSIS — J449 Chronic obstructive pulmonary disease, unspecified: Secondary | ICD-10-CM | POA: Diagnosis present

## 2019-09-05 DIAGNOSIS — Z803 Family history of malignant neoplasm of breast: Secondary | ICD-10-CM

## 2019-09-05 DIAGNOSIS — Z79890 Hormone replacement therapy (postmenopausal): Secondary | ICD-10-CM

## 2019-09-05 DIAGNOSIS — Z7951 Long term (current) use of inhaled steroids: Secondary | ICD-10-CM

## 2019-09-05 DIAGNOSIS — Z9049 Acquired absence of other specified parts of digestive tract: Secondary | ICD-10-CM

## 2019-09-05 DIAGNOSIS — R1310 Dysphagia, unspecified: Secondary | ICD-10-CM | POA: Diagnosis not present

## 2019-09-05 DIAGNOSIS — A419 Sepsis, unspecified organism: Secondary | ICD-10-CM | POA: Diagnosis present

## 2019-09-05 DIAGNOSIS — Z9682 Presence of neurostimulator: Secondary | ICD-10-CM

## 2019-09-05 DIAGNOSIS — Z883 Allergy status to other anti-infective agents status: Secondary | ICD-10-CM

## 2019-09-05 LAB — URINALYSIS, ROUTINE W REFLEX MICROSCOPIC
Glucose, UA: NEGATIVE mg/dL
Hgb urine dipstick: NEGATIVE
Ketones, ur: >80 mg/dL — AB
Leukocytes,Ua: NEGATIVE
Nitrite: NEGATIVE
Protein, ur: NEGATIVE mg/dL
Specific Gravity, Urine: >1.03 — ABNORMAL HIGH (ref 1.005–1.030)
pH: 5.5 (ref 5.0–8.0)

## 2019-09-05 LAB — SEDIMENTATION RATE: Sed Rate: 3 mm/hr (ref 0–22)

## 2019-09-05 LAB — CBC WITH DIFFERENTIAL/PLATELET
Abs Immature Granulocytes: 0.02 10*3/uL (ref 0.00–0.07)
Basophils Absolute: 0.1 10*3/uL (ref 0.0–0.1)
Basophils Relative: 1 %
Eosinophils Absolute: 0.1 10*3/uL (ref 0.0–0.5)
Eosinophils Relative: 1 %
HCT: 44.1 % (ref 36.0–46.0)
Hemoglobin: 14.1 g/dL (ref 12.0–15.0)
Immature Granulocytes: 0 %
Lymphocytes Relative: 14 %
Lymphs Abs: 1.5 10*3/uL (ref 0.7–4.0)
MCH: 26.8 pg (ref 26.0–34.0)
MCHC: 32 g/dL (ref 30.0–36.0)
MCV: 83.7 fL (ref 80.0–100.0)
Monocytes Absolute: 1.2 10*3/uL — ABNORMAL HIGH (ref 0.1–1.0)
Monocytes Relative: 12 %
Neutro Abs: 7.4 10*3/uL (ref 1.7–7.7)
Neutrophils Relative %: 72 %
Platelets: 230 10*3/uL (ref 150–400)
RBC: 5.27 MIL/uL — ABNORMAL HIGH (ref 3.87–5.11)
RDW: 20.6 % — ABNORMAL HIGH (ref 11.5–15.5)
WBC: 10.2 10*3/uL (ref 4.0–10.5)
nRBC: 0 % (ref 0.0–0.2)

## 2019-09-05 LAB — D-DIMER, QUANTITATIVE: D-Dimer, Quant: 0.59 ug/mL-FEU — ABNORMAL HIGH (ref 0.00–0.50)

## 2019-09-05 LAB — C-REACTIVE PROTEIN: CRP: 3.9 mg/dL — ABNORMAL HIGH (ref <1.0)

## 2019-09-05 LAB — COMPREHENSIVE METABOLIC PANEL
ALT: 12 U/L (ref 0–44)
AST: 31 U/L (ref 15–41)
Albumin: 4.1 g/dL (ref 3.5–5.0)
Alkaline Phosphatase: 58 U/L (ref 38–126)
Anion gap: 16 — ABNORMAL HIGH (ref 5–15)
BUN: 13 mg/dL (ref 8–23)
CO2: 24 mmol/L (ref 22–32)
Calcium: 9 mg/dL (ref 8.9–10.3)
Chloride: 95 mmol/L — ABNORMAL LOW (ref 98–111)
Creatinine, Ser: 0.85 mg/dL (ref 0.44–1.00)
GFR calc Af Amer: >60 mL/min (ref >60)
GFR calc non Af Amer: >60 mL/min (ref >60)
Glucose, Bld: 68 mg/dL — ABNORMAL LOW (ref 70–99)
Potassium: 4.7 mmol/L (ref 3.5–5.1)
Sodium: 135 mmol/L (ref 135–145)
Total Bilirubin: 0.9 mg/dL (ref 0.3–1.2)
Total Protein: 7.7 g/dL (ref 6.5–8.1)

## 2019-09-05 LAB — LACTIC ACID, PLASMA: Lactic Acid, Venous: 2.1 mmol/L (ref 0.5–1.9)

## 2019-09-05 LAB — SARS CORONAVIRUS 2 BY RT PCR (HOSPITAL ORDER, PERFORMED IN ~~LOC~~ HOSPITAL LAB): SARS Coronavirus 2: NEGATIVE

## 2019-09-05 LAB — BRAIN NATRIURETIC PEPTIDE: B Natriuretic Peptide: 31.8 pg/mL (ref 0.0–100.0)

## 2019-09-05 LAB — AMMONIA: Ammonia: 22 umol/L (ref 9–35)

## 2019-09-05 LAB — TSH: TSH: 0.547 u[IU]/mL (ref 0.350–4.500)

## 2019-09-05 LAB — TROPONIN I (HIGH SENSITIVITY): Troponin I (High Sensitivity): 6 ng/L (ref <18)

## 2019-09-05 LAB — LIPASE, BLOOD: Lipase: 15 U/L (ref 11–51)

## 2019-09-05 MED ORDER — ASPIRIN 81 MG PO CHEW
324.0000 mg | CHEWABLE_TABLET | Freq: Once | ORAL | Status: AC
  Administered 2019-09-05: 324 mg via ORAL
  Filled 2019-09-05: qty 4

## 2019-09-05 MED ORDER — ONDANSETRON 8 MG PO TBDP
8.0000 mg | ORAL_TABLET | Freq: Once | ORAL | Status: AC
  Administered 2019-09-05: 23:00:00 8 mg via ORAL
  Filled 2019-09-05: qty 1

## 2019-09-05 MED ORDER — MORPHINE SULFATE (PF) 4 MG/ML IV SOLN
4.0000 mg | Freq: Once | INTRAVENOUS | Status: AC
  Administered 2019-09-05: 4 mg via INTRAVENOUS
  Filled 2019-09-05: qty 1

## 2019-09-05 MED ORDER — LIDOCAINE HCL (PF) 1 % IJ SOLN
INTRAMUSCULAR | Status: AC
  Filled 2019-09-05: qty 5

## 2019-09-05 NOTE — ED Notes (Signed)
[facility]  Pt. Husband   Herbie [place] real name   Phone number is [phone]

## 2019-09-05 NOTE — ED Provider Notes (Signed)
[facility] EMERGENCY DEPARTMENT Provider Note   CSN: [id number] Arrival date & time: 09/05/19  1536     History   Chief Complaint Chief Complaint  Patient presents with  . Shortness of Breath    HPI KINZI [name] is a 68 y.o. female.     The history is provided by the patient and medical records. No language interpreter was used.  Fever Max temp prior to arrival:  101 Temp source:  Oral Severity:  Moderate Onset quality:  Gradual Duration:  3 days Progression:  Waxing and waning Chronicity:  New Relieved by:  Nothing Worsened by:  Nothing Ineffective treatments:  None tried Associated symptoms: chest pain, chills, cough, headaches, myalgias and nausea   Associated symptoms: no confusion, no congestion, no diarrhea, no dysuria, no rash, no rhinorrhea and no vomiting   Risk factors: immunosuppression (on steroids)     Past Medical History:  Diagnosis Date  . Addison's disease ([place])   . Adenocarcinoma of breast ([place]) 07/13/1992   LEFT LUMPECTOMY AND CHEMO AND RADIATION;   NOW HAS RIGHT BREAST MASS 09/25/14 - PLANS MASTECTOMY   . Adrenal insufficiency ([place])    "w/secondary Addison's"  . Anemia   . Arthritis   . Asthma    using incentive spirometer alot now  . Brittle bone disease   . Bruises easily   . Chronic bronchitis   . Chronic pain    DR. MARK PHILLIPS WITH PAIN CLINIC -  CHRONIC BACK PAIN AND SCOLIOSIS AND PREVIOUS BACK SURGERY AND NOW LUMBAR FRACTURE - PT HAS [employer] SPINAL STIMULATOR WHICH SHE CAN TURN ON AND OFF ; PT ON MORPINE AND CLONAZEPAM AND ROBAXIN  . COPD (chronic obstructive pulmonary disease) ([place])   . DDD (degenerative disc disease), lumbar   . Eye pain, right    last 4 months can be both eyes  . Falls frequently   . Family history of adverse reaction to anesthesia    ponv  . Fibromyalgia   . Fracture    LUMBAR 1 - FROM A FALL JUNE 2015 - PT HAS A LOT OF SEVERE BACK PAIN AND DIFFICULTY WALKING  . GERD (gastroesophageal  reflux disease)   . H/O esophageal spasm    "I can have as many as 31 TUMS before they stop"  . H/O hiatal hernia   . Head injury with loss of consciousness ([place])   . Headache(784.0)    "when I don't eat" (02/19/12)  . 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 History of kidney stones    1 large stone now  . History of urinary urgency    AZO HELPS - DOES TURN URINE ORANGE; DR. Risa Bordeaux IS PT'S UROLOGIST  . Hypertension   . Hypothyroidism   . Mass    right chest wall  . Migraines    "much better now" (02/19/12)  . Multiple sclerosis ([place]) 05/1988   Relasping and Remitting"  . OSA (obstructive sleep apnea)   . 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 Pneumonia   . PONV (postoperative nausea and vomiting)    one episode 1993 vomiting after surgery  . Poor venous access    PT STATES HER PORT A CATH NOT TO BE USED FOR SURGERY/ IV'S  . Positive TB test 2017   "ran deep in my father's family; went on INH for awhile; it caused migraines"  . Radiation adverse effect    "100% blockage  on left; 50% on right salivary; can drain w/massage"  . Restless leg syndrome, controlled 02/19/12   "much better since being on CPAP; been on that ~ 1 month now"  . Shingles   . Shortness of breath dyspnea    with exertion, improved, asthma improved  . Sleep apnea    no cpap used due to pt took off all the time   . Spinal headache   . Stress incontinence in female   . Upper GI bleeding 1970-1980   "had 4"    Patient Active Problem List   Diagnosis Date Noted  . Back pain 10/12/2017  . Degenerative disc disease, lumbar 10/12/2017  . Compression fracture of first lumbar vertebra ([place]) 10/12/2017  . GERD (gastroesophageal reflux disease) 10/12/2017  . Acute encephalopathy 04/19/2016  . Hypokalemia 04/19/2016  . Confusion 04/18/2016  . Nephrolithiasis 09/29/2015  . Portacath in place 09/07/2015  . Herpes zoster 09/07/2015  . Spinal cord  stimulator dysfunction ([place]) 04/09/2015  . History of left breast cancer 09/25/2014  . Pyelonephritis 05/17/2014  . Acute pyelonephritis 05/17/2014  . Abdominal pain, left lower quadrant 05/17/2014  . Nausea & vomiting 10/13/2012  . Iron deficiency anemia 06/18/2012  . DCIS (ductal carcinoma in situ) of breast 03/22/2012  . Abdominal pain 02/19/2012  . Addison's disease ([place])   . Hypertension   . Thyroid disease   . Shingles   . OBESITY 05/20/2008    Past Surgical History:  Procedure Laterality Date  . ABDOMINAL HYSTERECTOMY  1987  . APPENDECTOMY  1960's  . BACK SURGERY  ~ 2004   "chipped disk out to put in permanent stimulator"  . BAND HEMORRHOIDECTOMY  1979  . BILATERAL OOPHORECTOMY  1988  . BIOPSY  06/11/2018   Procedure: BIOPSY;  Surgeon: Ronnette Juniper, MD;  Location: [facility];  Service: Gastroenterology;;  . BIOPSY  12/05/2018   Procedure: BIOPSY;  Surgeon: Ronnette Juniper, MD;  Location: [facility];  Service: Gastroenterology;;  . Lum [place] INJECTION N/A 11/26/2017   Procedure: BOTOX INJECTION;  Surgeon: Ronnette Juniper, MD;  Location: [facility];  Service: Gastroenterology;  Laterality: N/A;  . BOTOX INJECTION N/A 06/11/2018   Procedure: BOTOX INJECTION;  Surgeon: Ronnette Juniper, MD;  Location: [facility];  Service: Gastroenterology;  Laterality: N/A;  . BOTOX INJECTION  12/05/2018   Procedure: BOTOX INJECTION;  Surgeon: Ronnette Juniper, MD;  Location: [facility];  Service: Gastroenterology;;  . BREAST LUMPECTOMY  07/13/1992   left   . CHOLECYSTECTOMY  1980's  . COLONOSCOPY    . CYSTOSCOPY WITH RETROGRADE PYELOGRAM, URETEROSCOPY AND STENT PLACEMENT Right 09/29/2015   Procedure: CYSTOSCOPY WITH RIGHT RETROGRADE PYELOGRAM, URETEROSCOPY AND STENT PLACEMENT, STONE EXTRACTION WITH BASKET;  Surgeon: Rana Snare, MD;  Location: [facility];  Service: Urology;  Laterality: Right;  . ESOPHAGEAL MANOMETRY N/A 10/03/2017   Procedure: ESOPHAGEAL MANOMETRY (EM);  Surgeon: Ronnette Juniper, MD;  [facility];  Service: Gastroenterology;  Laterality: N/A;  . ESOPHAGOGASTRODUODENOSCOPY N/A 04/17/2014   Procedure: ESOPHAGOGASTRODUODENOSCOPY (EGD);  Surgeon: Missy Sabins, MD;  Location: [facility];  Service: Endoscopy;  Laterality: N/A;  . ESOPHAGOGASTRODUODENOSCOPY N/A 11/26/2017   Procedure: ESOPHAGOGASTRODUODENOSCOPY (EGD);  Surgeon: Ronnette Juniper, MD;  Location: [facility];  Service: Gastroenterology;  Laterality: N/A;  . ESOPHAGOGASTRODUODENOSCOPY N/A 06/11/2018   Procedure: ESOPHAGOGASTRODUODENOSCOPY (EGD);  Surgeon: Ronnette Juniper, MD;  Location: [facility];  Service: Gastroenterology;  Laterality: N/A;  . ESOPHAGOGASTRODUODENOSCOPY N/A 12/05/2018   Procedure: ESOPHAGOGASTRODUODENOSCOPY (EGD);  Surgeon: Ronnette Juniper, MD;  Location: [facility];  Service: Gastroenterology;  Laterality: N/A;  w/ botox injection  . EXTRACORPOREAL SHOCK WAVE LITHOTRIPSY  2016  . fracture Left 03/2018   left foot fracture  . HOLMIUM LASER APPLICATION Right 15/72/6203   Procedure: HOLMIUM LASER LITHOTRIPSY ;  Surgeon: Rana Snare, MD;  [facility]: [facility];  Service: Urology;  Laterality: Right;  . LAPAROSCOPY    . MASS EXCISION Right 07/24/2018   Procedure: EXCISION OF RIGHT CHEST WALL MASS ERAS PATHWAY;  Surgeon: Coralie Keens, MD;  Location: [facility];  Service: General;  Laterality: Right;  . MASTECTOMY  2010   left   . MASTECTOMY Right 10/15  . PORT-A-CATH REMOVAL Right 09/25/2016   Procedure: REMOVAL PORT-A-CATH RIGHT;  Surgeon: Jackolyn Confer, MD;  Location: [facility];  Service: General;  Laterality: Right;  . PORTACATH PLACEMENT  10/30/2012   Procedure: INSERTION PORT-A-CATH;  Surgeon: Odis Hollingshead, MD;  Location: [facility];  Service: General;  Laterality: Right;  Right Ultrasound guided port-a-cath insertion  . SALIVARY GLAND SURGERY  ~ 2002   "left was 100% blocked twice; right 50%; only had one surgery"  . SALIVARY GLAND SURGERY LEFT    . SAVORY DILATION N/A 04/17/2014   Procedure: SAVORY DILATION;   Surgeon: Missy Sabins, MD;  Location: [facility];  Service: Endoscopy;  Laterality: N/A;  . SPINAL CORD STIMULATOR INSERTION  ~ 2004  . SPINAL CORD STIMULATOR INSERTION N/A 04/09/2015   Procedure: Spinal cord stimulator revision and replacement of paddle electrode;  Surgeon: Kristeen Miss, MD;  Location: [facility];  Service: Neurosurgery;  Laterality: N/A;  Spinal cord stimulator revision and replacement of paddle electrode  . TONSILLECTOMY AND ADENOIDECTOMY  1964  . TOTAL MASTECTOMY Right 09/25/2014   Procedure: RIGHT MASTECTOMY;  Surgeon: Jackolyn Confer, MD;  Location: [facility];  Service: General;  Laterality: Right;     OB History   No obstetric history on file.      Home Medications    Prior to Admission medications   Medication Sig Start Date End Date Taking? Authorizing Provider  amLODipine (NORVASC) 2.5 MG tablet Take 2.5 mg by mouth daily.    [provider]  Calcium-Vitamin D-Vitamin K (VIACTIV) 559-741-63 MG-UNT-MCG CHEW Chew 3 each by mouth daily.     [provider]  hydrOXYzine (ATARAX/VISTARIL) 25 MG tablet Take 25-50 mg by mouth every 8 (eight) hours as needed for nausea or vomiting.     [provider]  levothyroxine (SYNTHROID, LEVOTHROID) 50 MCG tablet Take 50-75 mcg by mouth See admin instructions. Take 1 tablet (50 mcg) by mouth daily on even days alternating with 1.5 tablets (75 mcg) by mouth daily on odd days    [provider]  losartan (COZAAR) 100 MG tablet Take 100 mg by mouth every evening.  08/03/17   [provider]  morphine (MSIR) 30 MG tablet Take 30 mg by mouth every 6 (six) hours as needed (S[place]iosis pain).     [provider]  oxyCODONE (OXY IR/ROXICODONE) 5 MG immediate release tablet Take 1 tablet (5 mg total) by mouth every 6 (six) hours as needed for moderate pain or severe pain. Patient not taking: Reported on 11/22/2018 07/24/18   Coralie Keens, MD  pantoprazole (PROTONIX) 40 MG tablet Take 40  mg by mouth daily.    [provider]  predniSONE (DELTASONE) 1 MG tablet Take 4 mg by mouth daily with breakfast.    [provider]  senna-docusate (SENOKOT-S) 8.6-50 MG tablet Take 1 tablet 2 (two) times daily by mouth. 10/18/17  Florencia Reasons, MD  valACYclovir (VALTREX) 1000 MG tablet Take 1,000 mg by mouth 3 (three) times daily as needed (for shingles).     [provider]  spironolactone (ALDACTONE) 25 MG tablet Take 25 mg by mouth 2 (two) times daily. For swelling   02/19/12  [provider]    Family History Family History  Problem Relation Age of Onset  . Lung cancer Paternal Uncle        smoker  . Breast cancer Cousin 56       maternal cousin  . Throat cancer Cousin        maternal cousin    Social History Social History   Tobacco Use  . Smoking status: Never Smoker  . Smokeless tobacco: Never Used  Substance Use Topics  . Alcohol use: No  . Drug use: No     Allergies   Fosamax [alendronate], Gabapentin, Iodine-131, Iohexol, Ivp dye [iodinated diagnostic agents], Procaine, Sulfa antibiotics, Terbinafine hcl, Ambien [zolpidem], Bactrim [sulfamethoxazole-trimethoprim], Compazine, Dilaudid [hydromorphone hcl], Guaifenesin, Hydromorphone, Lisinopril, Methocarbamol, Metoclopramide hcl, Promethazine, Vancomycin, Aldara [imiquimod], Amlodipine, Clindamycin/lincomycin, Trifluoperazine, Augmentin [amoxicillin-pot clavulanate], Biaxin [clarithromycin], Carafate [sucralfate], Guaifenesin, Other, and Prochlorperazine   Review of Systems Review of Systems  Constitutional: Positive for chills, fatigue and fever. Negative for diaphoresis.  HENT: Negative for congestion and rhinorrhea.   Eyes: Negative for photophobia and visual disturbance.  Respiratory: Positive for cough, chest tightness and shortness of breath. Negative for wheezing.   Cardiovascular: Positive for chest pain. Negative for palpitations and leg swelling.  Gastrointestinal: Positive  for nausea. Negative for abdominal pain, constipation, diarrhea and vomiting.  Genitourinary: Positive for frequency. Negative for dysuria and flank pain.  Musculoskeletal: Positive for myalgias. Negative for back pain, neck pain and neck stiffness.  Skin: Negative for rash and wound.  Neurological: Positive for headaches. Negative for light-headedness.  Psychiatric/Behavioral: Negative for confusion.  All other systems reviewed and are negative.    Physical Exam Updated Vital Signs BP 111/68 (BP Location: Right Arm)   Pulse (!) 118   Temp 99.8 F (37.7 C) (Oral)   Resp 18   Ht 5' 2.5" (1.588 m)   Wt 60.8 kg   SpO2 93%   BMI 24.12 kg/m   Physical Exam Vitals signs and nursing note reviewed.  Constitutional:      General: She is not in acute distress.    Appearance: She is well-developed. She is ill-appearing. She is not toxic-appearing or diaphoretic.  HENT:     Head: Normocephalic and atraumatic.     Right Ear: External ear normal.     Left Ear: External ear normal.     Nose: Nose normal.     Mouth/Throat:     Mouth: Mucous membranes are moist.     Pharynx: No pharyngeal swelling or oropharyngeal exudate.  Eyes:     Extraocular Movements: Extraocular movements intact.     Conjunctiva/sclera: Conjunctivae normal.     Pupils: Pupils are equal, round, and reactive to light.  Neck:     Musculoskeletal: Normal range of motion and neck supple.  Cardiovascular:     Rate and Rhythm: Regular rhythm. Tachycardia present.  Pulmonary:     Effort: Pulmonary effort is normal. Tachypnea present. No respiratory distress.     Breath sounds: No stridor. Rhonchi present. No decreased breath sounds, wheezing or rales.  Chest:     Chest wall: Tenderness present.  Abdominal:     General: There is no distension.     Tenderness: There is no  abdominal tenderness. There is no rebound.  Musculoskeletal:     Right lower leg: She exhibits no tenderness. No edema.     Left lower leg: She  exhibits no tenderness. No edema.  Skin:    General: Skin is warm.     Coloration: Skin is not pale.     Findings: No erythema or rash.  Neurological:     General: No focal deficit present.     Mental Status: She is alert and oriented to person, place, and time.     Cranial Nerves: No cranial nerve deficit.     Motor: No abnormal muscle tone.     Coordination: Coordination normal.     Deep Tendon Reflexes: Reflexes are normal and symmetric.      ED Treatments / Results  Labs (all labs ordered are listed, but only abnormal results are displayed) Labs Reviewed  CBC WITH DIFFERENTIAL/PLATELET - Abnormal; Notable for the following components:      Result Value   RBC 5.27 (*)    RDW 20.6 (*)    Monocytes Absolute 1.2 (*)    All other components within normal limits  COMPREHENSIVE METABOLIC PANEL - Abnormal; Notable for the following components:   Chloride 95 (*)    Glucose, Bld 68 (*)    Anion gap 16 (*)    All other components within normal limits  LACTIC ACID, PLASMA - Abnormal; Notable for the following components:   Lactic Acid, Venous 2.1 (*)    All other components within normal limits  C-REACTIVE PROTEIN - Abnormal; Notable for the following components:   CRP 3.9 (*)    All other components within normal limits  URINALYSIS, ROUTINE W REFLEX MICROSCOPIC - Abnormal; Notable for the following components:   Specific Gravity, Urine >1.030 (*)    Bilirubin Urine SMALL (*)    Ketones, ur >80 (*)    All other components within normal limits  D-DIMER, QUANTITATIVE (NOT AT [facility]) - Abnormal; Notable for the following components:   D-Dimer, Quant 0.59 (*)    All other components within normal limits  SARS CORONAVIRUS 2 (HOSPITAL ORDER, [facility])  URINE CULTURE  LIPASE, BLOOD  SEDIMENTATION RATE  AMMONIA  TSH  BRAIN NATRIURETIC PEPTIDE  LACTIC ACID, PLASMA  INFLUENZA PANEL BY PCR (TYPE A & B)  TROPONIN I (HIGH SENSITIVITY)  TROPONIN I (HIGH  SENSITIVITY)    EKG EKG Interpretation  Date/Time:  Friday September 05 2019 16:11:25 EDT Ventricular Rate:  110 PR Interval:    QRS Duration: 78 QT Interval:  329 QTC Calculation: 445 R Axis:   30 Text Interpretation:  Sinus tachycardia Probable left atrial enlargement Probable left ventricular hypertrophy ST elevation suggests acute pericarditis When compared to prior, faster rate and more ST elevations from prior.  No STEMI Confirmed by Antony Blackbird [phone]) on 09/05/2019 4:16:21 PM   Radiology Dg Chest Portable 1 View  Result Date: 09/05/2019 CLINICAL DATA:  Cough, fever EXAM: PORTABLE CHEST 1 VIEW COMPARISON:  Radiograph 01/17/2018, CT 03/22/2015 FINDINGS: Lower cervical spine fusion hardware is seen. Left axillary surgical clips are noted. Postsurgical changes from prior bilateral mastectomy. Spinal nerve stimulator is noted. Cardiac monitoring leads overlie the chest. Bandlike region of scarring in the left mid lung is similar to prior. No consolidation, features of edema, pneumothorax, or effusion. Cardiomediastinal contours are unchanged from comparison studies accounting for differences in technique. The aorta is tortuous and partially calcified. No acute osseous or soft tissue abnormality. IMPRESSION: No acute  cardiopulmonary abnormality, specifically no evidence of pneumonia. Electronically Signed   By: Lovena Le M.D.   On: 09/05/2019 18:34    Procedures Procedures (including critical care time)  Medications Ordered in ED Medications  aspirin chewable tablet 324 mg (324 mg Oral Given 09/05/19 1906)  morphine 4 MG/ML injection 4 mg (4 mg Intravenous Given 09/05/19 1906)  ondansetron (ZOFRAN-ODT) disintegrating tablet 8 mg (8 mg Oral Given 09/05/19 2258)     Initial Impression / Assessment and Plan / ED Course  I have reviewed the triage vital signs and the nursing notes.  Pertinent labs & imaging results that were available during my care of the patient were reviewed by  me and considered in my medical decision making (see chart for details).        Daisy [name] is a 68 y.o. female with a complicated past medical history significant for adrenal insufficiency with Addison's disease, hypertension, MS, GERD, hypothyroidism, hypertension, prior breast cancer status post mastectomy, fibromyalgia, prior GI bleed, prior pneumonia, COPD, and history of esophageal dysmotility due to the MS who presents with fevers, chills, cough, malaise, nausea, chest pain, and shortness of breath.  Patient reports that her temperature was 101.4 earlier today.  She reports that for the last 4 days she is been having worsening symptoms of severe chest pressure and chest pain with shortness of breath.  She reports the pain radiates toward her neck.  She reports nausea and some vomiting.  She denies abdominal pain.  She reports no significant urinary symptoms but does report feeling the fatigue and malaise worsening.  She did not want to come to the emergency department but was convinced by family to do so.    On arrival, patient has an oral temperature of 99.8, tachycardic, and tachypneic.  Her oxygen saturation is around 90% but dropped to 86 on my evaluation.  She was placed on nasal cannula oxygen supplementation.  Will get rectal temperature.  On exam, patient does have some coarseness in her lungs.  Chest is tender to palpation.  I cannot appreciate a murmur.  Good pulses in extremities.  Legs were nonedematous.  Abdomen was nontender, CVA areas and back nontender.  EKG shows ST elevations concerning for pericarditis.  I do not see STEMI.  Pericarditis is in the differential given the fever and chest discomfort.  Patient reports that her symptoms feel like prior pneumonia.  Patient will have screening labs, chest x-ray, and urine.  We will get ESR and CRP to help with possible pericarditis work-up.  Patient is allergic to contrast with prior anaphylaxis.  Given her fever, lack of new leg  pain or leg swelling, lower suspicion for DVT or PE causing her symptoms.  Will send rapid COVID test.  Patient is on chronic steroids for the adrenal insufficiency.  Anticipate admission given the hypoxia.  9:40 PM Work-up is begun to return.  Urinalysis shows ketones but no nitrites or leukocytes, doubt UTI.  Chest x-ray did not show pneumonia however with the hypoxia, cough, chest pain or shortness of breath, still concern for possible occult pneumonia.  Troponin negative.  ESR not elevated.  Lipase nonelevated.  Coronavirus test negative.  As patient still hypoxic and tachycardic with chest pain and shortness of breath, although she has fever, we still need to consider PE.  Patient is allergic to contrast with anaphylaxis so we will get d-dimer to see if we can ruled out.  Patient will likely need VQ scan if it is positive however  with her current oxygen requirement, she will need admission.  I am still concerned about possible pericarditis as etiology of the chest discomfort.  11:07 PM Patient's work-up continue to return.  CRP is elevated at 3.9.  D-dimer is elevated.  Patient will need VQ scan but cannot get one here.  I spoke with cardiology who also agrees she may have pericarditis.  They question if her fever is too high for pericarditis and there is an occult pneumonia possibly.  Patient is still tachycardic and febrile.  Was going to start broad-spectrum antibiotics however chart review shows that patient has intolerance and allergies to numerous medications.  As we do not have a clear source yet, will speak with hospitalist team about possible antibiotics for occult infection versus further imaging or work-up to look for other sources.  Cardiology agreed with admission to [facility] as well as for echo.  I attempted a bedside echo but due to bilateral mastectomy scar tissue and body habitus, was difficult to get complete images.  On my initial view, I did not see evidence of a large  pericardial effusion.  Patient will be admitted to medicine for further management of likely pericarditis with hypoxia.  12:35 AM To spoke with Dr. Roel Cluck with the hospital service who requested a Noncon CT chest.  This was ordered to help look for occult pneumonia.  Patient will be admitted for further management, will defer antibiotic treatment to admitting team if pneumonia is discovered or they deem it necessary at this time.  Final Clinical Impressions(s) / ED Diagnoses   Final diagnoses:  Precordial pain  Shortness of breath  Fever, unspecified fever cause    ED Discharge Orders    None     Clinical Impression: 1. Precordial pain   2. Shortness of breath   3. Fever, unspecified fever cause     Disposition: Admit  This note was prepared with assistance of Dragon voice recognition software. Occasional wrong-word or sound-a-like substitutions may have occurred due to the inherent limitations of voice recognition software.     Tegeler, Gwenyth Allegra, MD 09/06/19 [phone]

## 2019-09-05 NOTE — ED Notes (Addendum)
ED TO INPATIENT HANDOFF REPORT  ED Nurse Name and Phone #: Bonney Roussel RN [phone]  S Name/Age/Gender [name] 68 y.o. female Room/Bed: MH10/MH10  Code Status   Code Status: Prior  Home/SNF/Other   Patient oriented to: self, place, time and situation Is this baseline? Yes   Triage Complete: Triage complete  Chief Complaint SOB,FEVER  Triage Note Ms. Curb presens with SOB, chest heaviness and pressure, jaw pain, and fever of 101.4 that began last night. She is tachycardic aty 118, last took ASAS 2 hours ago for fever. Sats 92% RA   Allergies Allergies  Allergen Reactions  . Fosamax [Alendronate] Other (See Comments)    ESOPHAGEAL IRRITATION DANGER OF ESOPHAGEAL PERF.  . Gabapentin Anaphylaxis  . Iodine-131 Anaphylaxis  . Iohexol Anaphylaxis and Other (See Comments)    ANAPHYLACTIC REACTION!-kmr-10/16/05   . Ivp Dye [Iodinated Diagnostic Agents] Anaphylaxis and Other (See Comments)    Fever and blood clot  . Procaine Anaphylaxis and Other (See Comments)    novocaine caused shaking, rash, chest heaviness, made patient feel sick  . Sulfa Antibiotics Swelling and Other (See Comments)    SWELLING REACTION UNSPECIFIED  HALLUCINATIONS   . Terbinafine Hcl Anaphylaxis and Other (See Comments)    THROAT CLOSED UP  . Ambien [Zolpidem] Nausea And Vomiting and Other (See Comments)    SHAKEY  . Bactrim [Sulfamethoxazole-Trimethoprim] Other (See Comments)    ALTERED MENTAL STATUS HALLUCINATIONS   . Compazine Other (See Comments)    Hyper and shaky  . Dilaudid [Hydromorphone Hcl] Nausea And Vomiting and Other (See Comments)    HEADACHES  . Guaifenesin Nausea And Vomiting, Swelling and Other (See Comments)    SWELLING REACTION UNSPECIFIED  HYPERACTIVE  . Hydromorphone Nausea And Vomiting and Other (See Comments)    HEADACHE >> MIGRAINE  . Lisinopril Cough  . Methocarbamol Other (See Comments)    "Makes pt feel irritable "  . Metoclopramide Hcl Other (See  Comments)    Hyper, shaky  . Promethazine Anxiety and Other (See Comments)    DIZZINESS  . Vancomycin Swelling and Other (See Comments)    SWELLING REACTION UNSPECIFIED   . Aldara [Imiquimod] Other (See Comments)    "Active and sick "  . Amlodipine Swelling and Other (See Comments)    SWELLING REACTION (patient is currently taking with mild fluid retention)  . Clindamycin/Lincomycin Other (See Comments)    UNSPECIFIED REACTION   . Trifluoperazine Other (See Comments)    Hallucination   . Augmentin [Amoxicillin-Pot Clavulanate] Diarrhea and Other (See Comments)    Bad headaches Has patient had a PCN reaction causing immediate rash, facial/tongue/throat swelling, SOB or lightheadedness with hypotension: no Has patient had a PCN reaction causing severe rash involving mucus membranes or skin necrosis: no Has patient had a PCN reaction that required hospitalization no Has patient had a PCN reaction occurring within the last 10 years: no If all of the above answers are "NO", then may proceed with Cephalosporin use.   . Biaxin [Clarithromycin] Anxiety and Other (See Comments)    jittery  . Carafate [Sucralfate] Nausea Only and Other (See Comments)    Distends stomach.   . Guaifenesin Anxiety and Other (See Comments)    Headache  . Other Hives and Other (See Comments)    SOME TAPES CAUSE SKIN IRRITATION  . Prochlorperazine Anxiety    Level of Care/Admitting Diagnosis ED Disposition    ED Disposition Condition Comment   Admit  The patient appears reasonably stabilized for admission  considering the current resources, flow, and capabilities available in the ED at this time, and I doubt any other [facility] requiring further screening and/or treatment in the ED prior to admission is  present.       B Medical/Surgery History Past Medical History:  Diagnosis Date  . Addison's disease ([place])   . Adenocarcinoma of breast ([place]) 07/13/1992   LEFT LUMPECTOMY AND CHEMO AND RADIATION;   NOW HAS  RIGHT BREAST MASS 09/25/14 - PLANS MASTECTOMY   . Adrenal insufficiency ([place])    "w/secondary Addison's"  . Anemia   . Arthritis   . Asthma    using incentive spirometer alot now  . Brittle bone disease   . Bruises easily   . Chronic bronchitis   . Chronic pain    DR. MARK PHILLIPS WITH PAIN CLINIC -  CHRONIC BACK PAIN AND SCOLIOSIS AND PREVIOUS BACK SURGERY AND NOW LUMBAR FRACTURE - PT HAS [employer] SPINAL STIMULATOR WHICH SHE CAN TURN ON AND OFF ; PT ON MORPINE AND CLONAZEPAM AND ROBAXIN  . COPD (chronic obstructive pulmonary disease) ([place])   . DDD (degenerative disc disease), lumbar   . Eye pain, right    last 4 months can be both eyes  . Falls frequently   . Family history of adverse reaction to anesthesia    ponv  . Fibromyalgia   . Fracture    LUMBAR 1 - FROM A FALL JUNE 2015 - PT HAS A LOT OF SEVERE BACK PAIN AND DIFFICULTY WALKING  . GERD (gastroesophageal reflux disease)   . H/O esophageal spasm    "I can have as many as 49 TUMS before they stop"  . H/O hiatal hernia   . Head injury with loss of consciousness ([place])   . Headache(784.0)    "when I don't eat" (02/19/12)  . 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 History of kidney stones    1 large stone now  . History of urinary urgency    [facility] HELPS - DOES TURN URINE ORANGE; DR. Risa Garrabrant IS PT'S UROLOGIST  . Hypertension   . Hypothyroidism   . Mass    right chest wall  . Migraines    "much better now" (02/19/12)  . Multiple sclerosis ([place]) 05/1988   Relasping and Remitting"  . OSA (obstructive sleep apnea)   . 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 Pneumonia   . PONV (postoperative nausea and vomiting)    one episode 1993 vomiting after surgery  . Poor venous access    PT STATES HER PORT A CATH NOT TO BE USED FOR SURGERY/ IV'S  . Positive TB test 2017   "ran deep in my father's family; went on INH for awhile; it caused migraines"  . Radiation adverse effect     "100% blockage on left; 50% on right salivary; can drain w/massage"  . Restless leg syndrome, controlled 02/19/12   "much better since being on CPAP; been on that ~ 1 month now"  . Shingles   . Shortness of breath dyspnea    with exertion, improved, asthma improved  . Sleep apnea    no cpap used due to pt took off all the time   . Spinal headache   . Stress incontinence in female   . Upper GI bleeding 1970-1980   "had 4"   Past Surgical History:  Procedure Laterality Date  . ABDOMINAL HYSTERECTOMY  1987  . APPENDECTOMY  1960's  .  BACK SURGERY  ~ 2004   "chipped disk out to put in permanent stimulator"  . BAND HEMORRHOIDECTOMY  1979  . BILATERAL OOPHORECTOMY  1988  . BIOPSY  06/11/2018   Procedure: BIOPSY;  Surgeon: Ronnette Juniper, MD;  Location: [facility];  Service: Gastroenterology;;  . BIOPSY  12/05/2018   Procedure: BIOPSY;  Surgeon: Ronnette Juniper, MD;  Location: [facility];  Service: Gastroenterology;;  . Lum [place] INJECTION N/A 11/26/2017   Procedure: BOTOX INJECTION;  Surgeon: Ronnette Juniper, MD;  Location: [facility];  Service: Gastroenterology;  Laterality: N/A;  . BOTOX INJECTION N/A 06/11/2018   Procedure: BOTOX INJECTION;  Surgeon: Ronnette Juniper, MD;  [facility];  Service: Gastroenterology;  Laterality: N/A;  . BOTOX INJECTION  12/05/2018   Procedure: BOTOX INJECTION;  Surgeon: Ronnette Juniper, MD;  [facility];  Service: Gastroenterology;;  . BREAST LUMPECTOMY  07/13/1992   left   . CHOLECYSTECTOMY  1980's  . COLONOSCOPY    . CYSTOSCOPY WITH RETROGRADE PYELOGRAM, URETEROSCOPY AND STENT PLACEMENT Right 09/29/2015   Procedure: CYSTOSCOPY WITH RIGHT RETROGRADE PYELOGRAM, URETEROSCOPY AND STENT PLACEMENT, STONE EXTRACTION WITH BASKET;  Surgeon: Rana Snare, MD;  [facility];  Service: Urology;  Laterality: Right;  . ESOPHAGEAL MANOMETRY N/A 10/03/2017   Procedure: ESOPHAGEAL MANOMETRY (EM);  Surgeon: Ronnette Juniper, MD;  [facility];  Service:  Gastroenterology;  Laterality: N/A;  . ESOPHAGOGASTRODUODENOSCOPY N/A 04/17/2014   Procedure: ESOPHAGOGASTRODUODENOSCOPY (EGD);  Surgeon: Missy Sabins, MD;  Location: [facility];  Service: Endoscopy;  Laterality: N/A;  . ESOPHAGOGASTRODUODENOSCOPY N/A 11/26/2017   Procedure: ESOPHAGOGASTRODUODENOSCOPY (EGD);  Surgeon: Ronnette Juniper, MD;  Location: [facility];  Service: Gastroenterology;  Laterality: N/A;  . ESOPHAGOGASTRODUODENOSCOPY N/A 06/11/2018   Procedure: ESOPHAGOGASTRODUODENOSCOPY (EGD);  Surgeon: Ronnette Juniper, MD;  Location: [facility];  Service: Gastroenterology;  Laterality: N/A;  . ESOPHAGOGASTRODUODENOSCOPY N/A 12/05/2018   Procedure: ESOPHAGOGASTRODUODENOSCOPY (EGD);  Surgeon: Ronnette Juniper, MD;  Location: [facility];  Service: Gastroenterology;  Laterality: N/A;  w/ botox injection  . EXTRACORPOREAL SHOCK WAVE LITHOTRIPSY  2016  . fracture Left 03/2018   left foot fracture  . HOLMIUM LASER APPLICATION Right [id number]   Procedure: HOLMIUM LASER LITHOTRIPSY ;  Surgeon: Rana Snare, MD;  [facility];  Service: Urology;  Laterality: Right;  . LAPAROSCOPY    . MASS EXCISION Right 07/24/2018   Procedure: EXCISION OF RIGHT CHEST WALL MASS ERAS PATHWAY;  Surgeon: Coralie Keens, MD;  [facility];  Service: General;  Laterality: Right;  . MASTECTOMY  2010   left   . MASTECTOMY Right 10/15  . PORT-A-CATH REMOVAL Right 09/25/2016   Procedure: REMOVAL PORT-A-CATH RIGHT;  Surgeon: Jackolyn Confer, MD;  Location: [facility];  Service: General;  Laterality: Right;  . PORTACATH PLACEMENT  10/30/2012   Procedure: INSERTION PORT-A-CATH;  Surgeon: Odis Hollingshead, MD;  [facility];  Service: General;  Laterality: Right;  Right Ultrasound guided port-a-cath insertion  . SALIVARY GLAND SURGERY  ~ 2002   "left was 100% blocked twice; right 50%; only had one surgery"  . SALIVARY GLAND SURGERY LEFT    . SAVORY DILATION N/A 04/17/2014   Procedure: SAVORY DILATION;  Surgeon: Missy Sabins, MD;  [facility];  Service: Endoscopy;  Laterality: N/A;  . SPINAL CORD STIMULATOR INSERTION  ~ 2004  . SPINAL CORD STIMULATOR INSERTION N/A 04/09/2015   Procedure: Spinal cord stimulator revision and replacement of paddle electrode;  Surgeon: Kristeen Miss, MD;  Location: [facility];  Service: Neurosurgery;  Laterality: N/A;  Spinal cord stimulator revision and replacement of paddle electrode  . TONSILLECTOMY AND ADENOIDECTOMY  1964  . TOTAL MASTECTOMY Right 09/25/2014   Procedure: RIGHT MASTECTOMY;  Surgeon: Jackolyn Confer, MD;  [facility];  Service: General;  Laterality: Right;     A IV Location/Drains/Wounds Patient Lines/Drains/Airways Status   Active Line/Drains/Airways    Name:   Placement date:   Placement time:   Site:   Days:   Implanted Port 05/17/14 Right Chest   05/17/14    1615    Chest   1937   Peripheral IV 09/05/19 Left Hand   09/05/19    1730    Hand   less than 1   Peripheral IV 09/05/19 Right Hand   09/05/19    1720    Hand   less than 1   Closed System Drain 1 Right Breast Bulb (JP) 19 Fr.   09/25/14    1201    Breast   1806   Ureteral Drain/Stent Right ureter 6 Fr.   09/29/15    1144    Right ureter   1437   Incision (Closed) 09/25/14 Chest   09/25/14    1057     1806   Incision (Closed) 04/09/15 Back   04/09/15    1419     1610   Incision (Closed) 09/25/16 Chest Right   09/25/16    0756     1075   Incision (Closed) 07/24/18 Chest Right   07/24/18    1148     408          Intake/Output Last 24 hours No intake or output data in the 24 hours ending 09/05/19 2350  Labs/Imaging Results for orders placed or performed during the hospital encounter of 09/05/19 (from the past 48 hour(s))  CBC with Differential     Status: Abnormal   Collection Time: 09/05/19  5:11 PM  Result Value Ref Range   WBC 10.2 4.0 - 10.5 K/uL   RBC 5.27 (H) 3.87 - 5.11 MIL/uL   Hemoglobin 14.1 12.0 - 15.0 g/dL   HCT 44.1 36.0 - 46.0 %   MCV 83.7 80.0 - 100.0 fL    MCH 26.8 26.0 - 34.0 pg   MCHC 32.0 30.0 - 36.0 g/dL   RDW 20.6 (H) 11.5 - 15.5 %   Platelets 230 150 - 400 K/uL   nRBC 0.0 0.0 - 0.2 %   Neutrophils Relative % 72 %   Neutro Abs 7.4 1.7 - 7.7 K/uL   Lymphocytes Relative 14 %   Lymphs Abs 1.5 0.7 - 4.0 K/uL   Monocytes Relative 12 %   Monocytes Absolute 1.2 (H) 0.1 - 1.0 K/uL   Eosinophils Relative 1 %   Eosinophils Absolute 0.1 0.0 - 0.5 K/uL   Basophils Relative 1 %   Basophils Absolute 0.1 0.0 - 0.1 K/uL   Immature Granulocytes 0 %   Abs Immature Granulocytes 0.02 0.00 - 0.07 K/uL    Comment: Performed at [facility], [address] [place], [place] [address]  Comprehensive metabolic panel     Status: Abnormal   Collection Time: 09/05/19  5:11 PM  Result Value Ref Range   Sodium 135 135 - 145 mmol/L   Potassium 4.7 3.5 - 5.1 mmol/L    Comment: SLIGHT HEMOLYSIS   Chloride 95 (L) 98 - 111 mmol/L   CO2 24 22 - 32 mmol/L   Glucose, Bld 68 (L) 70 - 99 mg/dL   BUN  13 8 - 23 mg/dL   Creatinine, Ser 0.85 0.44 - 1.00 mg/dL   Calcium 9.0 8.9 - 10.3 mg/dL   Total Protein 7.7 6.5 - 8.1 g/dL   Albumin 4.1 3.5 - 5.0 g/dL   AST 31 15 - 41 U/L   ALT 12 0 - 44 U/L   Alkaline Phosphatase 58 38 - 126 U/L   Total Bilirubin 0.9 0.3 - 1.2 mg/dL   GFR calc non Af Amer >60 >60 mL/min   GFR calc Af Amer >60 >60 mL/min   Anion gap 16 (H) 5 - 15    Comment: Performed at [facility], [address] [place], [place] [address]  Lactic acid, plasma     Status: Abnormal   Collection Time: 09/05/19  5:11 PM  Result Value Ref Range   Lactic Acid, Venous 2.1 (HH) 0.5 - 1.9 mmol/L    Comment: CRITICAL RESULT CALLED TO, READ BACK BY AND VERIFIED WITH: REED C RN [phone] PHILLIPS C Performed at [facility], [address] [place], [place] [address]   Lipase, blood     Status: None   Collection Time: 09/05/19  5:11 PM  Result Value Ref Range   Lipase 15 11 - 51 U/L    Comment: Performed at [facility], [address] [place], [place] [address]  SARS Coronavirus 2 [facility] order, Performed in [facility] hospital lab) Nasopharyngeal Nasopharyngeal Swab     Status: None   Collection Time: 09/05/19  5:11 PM   Specimen: Nasopharyngeal Swab  Result Value Ref Range   SARS Coronavirus 2 NEGATIVE NEGATIVE    Comment: (NOTE) If result is NEGATIVE SARS-CoV-2 target nucleic acids are NOT DETECTED. The SARS-CoV-2 RNA is generally detectable in upper and lower  respiratory specimens during the acute phase of infection. The lowest  concentration of SARS-CoV-2 viral copies this assay can detect is 250  copies / mL. A negative result does not preclude SARS-CoV-2 infection  and should not be used as the sole basis for treatment or other  patient management decisions.  A negative result may occur with  improper specimen collection / handling, submission of specimen other  than nasopharyngeal swab, presence of viral mutation(s) within the  areas targeted by this assay, and inadequate number of viral copies  (<250 copies / mL). A negative result must be combined with clinical  observations, patient history, and epidemiological information. If result is POSITIVE SARS-CoV-2 target nucleic acids are DETECTED. The SARS-CoV-2 RNA is generally detectable in upper and lower  respiratory specimens dur ing the acute phase of infection.  Positive  results are indicative of active infection with SARS-CoV-2.  Clinical  correlation with patient history and other diagnostic information is  necessary to determine patient infection status.  Positive results do  not rule out bacterial infection or co-infection with other viruses. If result is PRESUMPTIVE POSTIVE SARS-CoV-2 nucleic acids MAY BE PRESENT.   A presumptive positive result was obtained on the submitted specimen  and confirmed on repeat testing.  While 2019 novel coronavirus  (SARS-CoV-2) nucleic acids may be present in the submitted sample   additional confirmatory testing may be necessary for epidemiological  and / or clinical management purposes  to differentiate between  SARS-CoV-2 and other Sarbecovirus currently known to infect humans.  If clinically indicated additional testing with an alternate test  methodology [phone]) is advised. The SARS-CoV-2 RNA is generally  detectable in upper and lower respiratory  sp ecimens during the acute  phase of infection. The expected result is Negative. Fact Sheet for Patients:  StrictlyIdeas.no Fact Sheet for Healthcare Providers: BankingDealers.co.za This test is not yet approved or cleared by the [place] FDA and has been authorized for detection and/or diagnosis of SARS-CoV-2 by FDA under an Emergency Use Authorization (EUA).  This EUA will remain in effect (meaning this test can be used) for the duration of the COVID-19 declaration under Section 564(b)(1) of the Act, 21 U.S.C. section 360bbb-3(b)(1), unless the authorization is terminated or revoked sooner. Performed at [facility], [address] [place], [place] [address]   Troponin I (High Sensitivity)     Status: None   Collection Time: 09/05/19  5:11 PM  Result Value Ref Range   Troponin I (High Sensitivity) 6 <18 ng/L    Comment: (NOTE) Elevated high sensitivity troponin I (hsTnI) values and significant  changes across serial measurements may suggest ACS but many other  chronic and acute conditions are known to elevate hsTnI results.  Refer to the "Links" section for chest pain algorithms and additional  guidance. Performed at [facility], [address] [place], [place] [address]   Sedimentation rate     Status: None   Collection Time: 09/05/19  5:11 PM  Result Value Ref Range   Sed Rate 3 0 - 22 mm/hr    Comment: Performed at [facility], [address] [place], [place] [address]  Brain natriuretic peptide     Status:  None   Collection Time: 09/05/19  5:11 PM  Result Value Ref Range   B Natriuretic Peptide 31.8 0.0 - 100.0 pg/mL    Comment: Performed at [facility], [address] [place], [place] [address]  C-reactive protein     Status: Abnormal   Collection Time: 09/05/19  5:25 PM  Result Value Ref Range   CRP 3.9 (H) <1.0 mg/dL    Comment: Performed at [facility], [place] [address], [place], [place] [address]  TSH     Status: None   Collection Time: 09/05/19  5:25 PM  Result Value Ref Range   TSH 0.547 0.350 - 4.500 uIU/mL    Comment: Performed by a 3rd Generation assay with a functional sensitivity of <=0.01 uIU/mL. Performed at [facility], [place] [address] [place], [place] [address]   D-dimer, quantitative (not at [facility])     Status: Abnormal   Collection Time: 09/05/19  5:25 PM  Result Value Ref Range   D-Dimer, Quant 0.59 (H) 0.00 - 0.50 ug/mL-FEU    Comment: (NOTE) At the manufacturer cut-off of 0.50 ug/mL FEU, this assay has been documented to exclude PE with a sensitivity and negative predictive value of 97 to 99%.  At this time, this assay has not been approved by the FDA to exclude DVT/VTE. Results should be correlated with clinical presentation. Performed at [facility], [address] [place], [place] [address]   Ammonia     Status: None   Collection Time: 09/05/19  6:31 PM  Result Value Ref Range   Ammonia 22 9 - 35 umol/L    Comment: Performed at [facility], [address] [place], [place] [address]  Urinalysis, Routine w reflex microscopic     Status: Abnormal   Collection Time: 09/05/19  8:16 PM  Result Value Ref Range   Color, Urine YELLOW YELLOW  APPearance CLEAR CLEAR   Specific Gravity, Urine >1.030 (H) 1.005 - 1.030   pH 5.5 5.0 - 8.0   Glucose, UA NEGATIVE NEGATIVE mg/dL   Hgb urine dipstick NEGATIVE NEGATIVE   Bilirubin Urine SMALL (A) NEGATIVE   Ketones, ur >80 (A) NEGATIVE mg/dL   Protein, ur NEGATIVE  NEGATIVE mg/dL   Nitrite NEGATIVE NEGATIVE   Leukocytes,Ua NEGATIVE NEGATIVE    Comment: Microscopic not done on urines with negative protein, blood, leukocytes, nitrite, or glucose < 500 mg/dL. Performed at [facility], [address] [place], [place] [address]    Dg Chest Portable 1 View  Result Date: 09/05/2019 CLINICAL DATA:  Cough, fever EXAM: PORTABLE CHEST 1 VIEW COMPARISON:  Radiograph 01/17/2018, CT 03/22/2015 FINDINGS: Lower cervical spine fusion hardware is seen. Left axillary surgical clips are noted. Postsurgical changes from prior bilateral mastectomy. Spinal nerve stimulator is noted. Cardiac monitoring leads overlie the chest. Bandlike region of scarring in the left mid lung is similar to prior. No consolidation, features of edema, pneumothorax, or effusion. Cardiomediastinal contours are unchanged from comparison studies accounting for differences in technique. The aorta is tortuous and partially calcified. No acute osseous or soft tissue abnormality. IMPRESSION: No acute cardiopulmonary abnormality, specifically no evidence of pneumonia. Electronically Signed   By: Lovena Le M.D.   On: 09/05/2019 18:34    Pending Labs Unresulted Labs (From admission, onward)    Start     Ordered   09/05/19 2139  Influenza panel by PCR (type A & B)  (Influenza PCR Panel)  Once,   STAT     09/05/19 2138   09/05/19 1652  Urine culture  ONCE - STAT,   STAT     09/05/19 1651   09/05/19 1650  Lactic acid, plasma  Now then every 2 hours,   STAT     09/05/19 1651          Vitals/Pain Today's Vitals   09/05/19 1906 09/05/19 2020 09/05/19 2026 09/05/19 2305  BP:  (!) 133/97  130/79  Pulse:  (!) 124  (!) 110  Resp:  (!) 23  16  Temp:      TempSrc:      SpO2:  95%  99%  Weight:      Height:      PainSc: 0-No pain  3      Isolation Precautions Droplet precaution  Medications Medications  aspirin chewable tablet 324 mg (324 mg Oral Given 09/05/19 1906)  morphine 4  MG/ML injection 4 mg (4 mg Intravenous Given 09/05/19 1906)  ondansetron (ZOFRAN-ODT) disintegrating tablet 8 mg (8 mg Oral Given 09/05/19 2258)    Mobility walks with device Low fall risk   Focused Assessments     R Recommendations: See Admitting Provider Note  Report given to:   Additional Notes:  Pt. Is alert and oriented with dry flaky skin and no break down noted on any body parts.  Pt. Has no open wounds or scabbed over skin.  Pt. Does have noted bilat. Mastectomy from past breast cancer history.  Pt. Extensive history has her noted with taking multiple meds and multiple allergies to meds.  Pt. Has C/O chest soreness to touch and with movement.

## 2019-09-05 NOTE — ED Notes (Signed)
EDP at bedside doing [name] of heart, Pt. Tolerated well.

## 2019-09-05 NOTE — ED Notes (Signed)
Pt. Placed on 2 liter of oxygen due to with movement she drops to 87% on RA

## 2019-09-05 NOTE — ED Triage Notes (Signed)
Daisy [name] presens with SOB, chest heaviness and pressure, jaw pain, and fever of 101.4 that began last night. She is tachycardic aty 118, last took ASAS 2 hours ago for fever. Sats 92% RA

## 2019-09-05 NOTE — ED Notes (Signed)
Date and time results received: 09/05/19 1742   Test: lactic acid Critical Value: 2.1 Name of Provider Notified: Tegeler   Orders Received? Or Actions Taken?: no orders given

## 2019-09-05 NOTE — ED Notes (Signed)
Family at bedside. 

## 2019-09-05 NOTE — ED Notes (Signed)
Explained to EDP that getting blood for next troponin will be poss. Impossible and if need for any more blood will be more than impossible due to Pt. IV sites will not draw back.  IV sites will flush but not draw back.

## 2019-09-06 ENCOUNTER — Inpatient Hospital Stay (HOSPITAL_COMMUNITY): Payer: Medicare Other

## 2019-09-06 ENCOUNTER — Emergency Department (HOSPITAL_BASED_OUTPATIENT_CLINIC_OR_DEPARTMENT_OTHER): Payer: Medicare Other

## 2019-09-06 DIAGNOSIS — R079 Chest pain, unspecified: Secondary | ICD-10-CM | POA: Diagnosis not present

## 2019-09-06 DIAGNOSIS — R1310 Dysphagia, unspecified: Secondary | ICD-10-CM | POA: Diagnosis not present

## 2019-09-06 DIAGNOSIS — B029 Zoster without complications: Secondary | ICD-10-CM | POA: Diagnosis present

## 2019-09-06 DIAGNOSIS — J449 Chronic obstructive pulmonary disease, unspecified: Secondary | ICD-10-CM | POA: Diagnosis not present

## 2019-09-06 DIAGNOSIS — G4733 Obstructive sleep apnea (adult) (pediatric): Secondary | ICD-10-CM | POA: Diagnosis present

## 2019-09-06 DIAGNOSIS — G2581 Restless legs syndrome: Secondary | ICD-10-CM | POA: Diagnosis present

## 2019-09-06 DIAGNOSIS — Z853 Personal history of malignant neoplasm of breast: Secondary | ICD-10-CM

## 2019-09-06 DIAGNOSIS — K224 Dyskinesia of esophagus: Secondary | ICD-10-CM | POA: Diagnosis not present

## 2019-09-06 DIAGNOSIS — E271 Primary adrenocortical insufficiency: Secondary | ICD-10-CM

## 2019-09-06 DIAGNOSIS — R4702 Dysphasia: Secondary | ICD-10-CM | POA: Diagnosis present

## 2019-09-06 DIAGNOSIS — K222 Esophageal obstruction: Secondary | ICD-10-CM | POA: Diagnosis not present

## 2019-09-06 DIAGNOSIS — I10 Essential (primary) hypertension: Secondary | ICD-10-CM | POA: Diagnosis present

## 2019-09-06 DIAGNOSIS — I319 Disease of pericardium, unspecified: Secondary | ICD-10-CM | POA: Diagnosis present

## 2019-09-06 DIAGNOSIS — J45909 Unspecified asthma, uncomplicated: Secondary | ICD-10-CM | POA: Diagnosis not present

## 2019-09-06 DIAGNOSIS — R296 Repeated falls: Secondary | ICD-10-CM | POA: Diagnosis present

## 2019-09-06 DIAGNOSIS — I313 Pericardial effusion (noninflammatory): Secondary | ICD-10-CM | POA: Diagnosis not present

## 2019-09-06 DIAGNOSIS — E039 Hypothyroidism, unspecified: Secondary | ICD-10-CM | POA: Diagnosis not present

## 2019-09-06 DIAGNOSIS — Z20828 Contact with and (suspected) exposure to other viral communicable diseases: Secondary | ICD-10-CM | POA: Diagnosis not present

## 2019-09-06 DIAGNOSIS — G8929 Other chronic pain: Secondary | ICD-10-CM | POA: Diagnosis not present

## 2019-09-06 DIAGNOSIS — Z9682 Presence of neurostimulator: Secondary | ICD-10-CM | POA: Diagnosis not present

## 2019-09-06 DIAGNOSIS — R0602 Shortness of breath: Secondary | ICD-10-CM | POA: Diagnosis not present

## 2019-09-06 DIAGNOSIS — R7611 Nonspecific reaction to tuberculin skin test without active tuberculosis: Secondary | ICD-10-CM | POA: Diagnosis present

## 2019-09-06 DIAGNOSIS — M797 Fibromyalgia: Secondary | ICD-10-CM | POA: Diagnosis not present

## 2019-09-06 DIAGNOSIS — K449 Diaphragmatic hernia without obstruction or gangrene: Secondary | ICD-10-CM | POA: Diagnosis present

## 2019-09-06 DIAGNOSIS — G35 Multiple sclerosis: Secondary | ICD-10-CM | POA: Diagnosis present

## 2019-09-06 DIAGNOSIS — K317 Polyp of stomach and duodenum: Secondary | ICD-10-CM | POA: Diagnosis not present

## 2019-09-06 DIAGNOSIS — R509 Fever, unspecified: Secondary | ICD-10-CM | POA: Diagnosis not present

## 2019-09-06 DIAGNOSIS — A419 Sepsis, unspecified organism: Secondary | ICD-10-CM | POA: Diagnosis not present

## 2019-09-06 DIAGNOSIS — K219 Gastro-esophageal reflux disease without esophagitis: Secondary | ICD-10-CM | POA: Diagnosis not present

## 2019-09-06 DIAGNOSIS — Z923 Personal history of irradiation: Secondary | ICD-10-CM | POA: Diagnosis not present

## 2019-09-06 DIAGNOSIS — Q780 Osteogenesis imperfecta: Secondary | ICD-10-CM | POA: Diagnosis not present

## 2019-09-06 LAB — BASIC METABOLIC PANEL
Anion gap: 17 — ABNORMAL HIGH (ref 5–15)
BUN: 19 mg/dL (ref 8–23)
CO2: 21 mmol/L — ABNORMAL LOW (ref 22–32)
Calcium: 8.8 mg/dL — ABNORMAL LOW (ref 8.9–10.3)
Chloride: 98 mmol/L (ref 98–111)
Creatinine, Ser: 1.11 mg/dL — ABNORMAL HIGH (ref 0.44–1.00)
GFR calc Af Amer: 60 mL/min — ABNORMAL LOW (ref >60)
GFR calc non Af Amer: 51 mL/min — ABNORMAL LOW (ref >60)
Glucose, Bld: 63 mg/dL — ABNORMAL LOW (ref 70–99)
Potassium: 4.4 mmol/L (ref 3.5–5.1)
Sodium: 136 mmol/L (ref 135–145)

## 2019-09-06 LAB — INFLUENZA PANEL BY PCR (TYPE A & B)
Influenza A By PCR: NEGATIVE
Influenza B By PCR: NEGATIVE

## 2019-09-06 LAB — CBC
HCT: 41.5 % (ref 36.0–46.0)
Hemoglobin: 13.7 g/dL (ref 12.0–15.0)
MCH: 27.7 pg (ref 26.0–34.0)
MCHC: 33 g/dL (ref 30.0–36.0)
MCV: 83.8 fL (ref 80.0–100.0)
Platelets: 246 10*3/uL (ref 150–400)
RBC: 4.95 MIL/uL (ref 3.87–5.11)
RDW: 20.6 % — ABNORMAL HIGH (ref 11.5–15.5)
WBC: 8.4 10*3/uL (ref 4.0–10.5)
nRBC: 0 % (ref 0.0–0.2)

## 2019-09-06 LAB — PROCALCITONIN: Procalcitonin: 0.39 ng/mL

## 2019-09-06 LAB — MRSA PCR SCREENING: MRSA by PCR: NEGATIVE

## 2019-09-06 LAB — ECHOCARDIOGRAM COMPLETE
Height: 62.5 in
Weight: 2220.8 oz

## 2019-09-06 LAB — TROPONIN I (HIGH SENSITIVITY): Troponin I (High Sensitivity): 8 ng/L (ref <18)

## 2019-09-06 LAB — LACTIC ACID, PLASMA: Lactic Acid, Venous: 1.3 mmol/L (ref 0.5–1.9)

## 2019-09-06 LAB — GLUCOSE, CAPILLARY: Glucose-Capillary: 100 mg/dL — ABNORMAL HIGH (ref 70–99)

## 2019-09-06 MED ORDER — MORPHINE SULFATE 15 MG PO TABS
30.0000 mg | ORAL_TABLET | Freq: Four times a day (QID) | ORAL | Status: DC | PRN
  Administered 2019-09-06 – 2019-09-10 (×9): 30 mg via ORAL
  Filled 2019-09-06 (×11): qty 2

## 2019-09-06 MED ORDER — AMLODIPINE BESYLATE 2.5 MG PO TABS
2.5000 mg | ORAL_TABLET | Freq: Every day | ORAL | Status: DC
  Filled 2019-09-06: qty 1

## 2019-09-06 MED ORDER — LIP MEDEX EX OINT
TOPICAL_OINTMENT | CUTANEOUS | Status: DC | PRN
  Filled 2019-09-06: qty 7

## 2019-09-06 MED ORDER — PREDNISONE 1 MG PO TABS
4.0000 mg | ORAL_TABLET | Freq: Every day | ORAL | Status: DC
  Administered 2019-09-07: 4 mg via ORAL
  Filled 2019-09-06: qty 4

## 2019-09-06 MED ORDER — PANTOPRAZOLE SODIUM 40 MG PO TBEC
40.0000 mg | DELAYED_RELEASE_TABLET | Freq: Every day | ORAL | Status: DC
  Administered 2019-09-06: 40 mg via ORAL
  Filled 2019-09-06: qty 1

## 2019-09-06 MED ORDER — LEVOTHYROXINE SODIUM 50 MCG PO TABS: 50.0000 ug | ORAL_TABLET | ORAL | Status: DC

## 2019-09-06 MED ORDER — DIPHENHYDRAMINE HCL 25 MG PO CAPS: 25.0000 mg | ORAL_CAPSULE | Freq: Three times a day (TID) | ORAL | Status: DC | PRN

## 2019-09-06 MED ORDER — MORPHINE SULFATE (PF) 2 MG/ML IV SOLN: 2.0000 mg | INTRAVENOUS | Status: DC | PRN

## 2019-09-06 MED ORDER — LEVOTHYROXINE SODIUM 75 MCG PO TABS
75.0000 ug | ORAL_TABLET | ORAL | Status: DC
  Administered 2019-09-06 – 2019-09-10 (×3): 75 ug via ORAL
  Filled 2019-09-06 (×3): qty 1

## 2019-09-06 MED ORDER — POLYVINYL ALCOHOL 1.4 % OP SOLN
1.0000 [drp] | Freq: Three times a day (TID) | OPHTHALMIC | Status: DC | PRN
  Administered 2019-09-07: 1 [drp] via OPHTHALMIC
  Filled 2019-09-06: qty 15

## 2019-09-06 MED ORDER — ENOXAPARIN SODIUM 40 MG/0.4ML ~~LOC~~ SOLN
40.0000 mg | SUBCUTANEOUS | Status: DC
  Administered 2019-09-06 – 2019-09-10 (×5): 40 mg via SUBCUTANEOUS
  Filled 2019-09-06 (×5): qty 0.4

## 2019-09-06 MED ORDER — LEVOTHYROXINE SODIUM 50 MCG PO TABS
50.0000 ug | ORAL_TABLET | ORAL | Status: DC
  Administered 2019-09-07 – 2019-09-09 (×2): 50 ug via ORAL
  Filled 2019-09-06 (×2): qty 1

## 2019-09-06 MED ORDER — SODIUM CHLORIDE 0.9 % IV SOLN
2.0000 g | INTRAVENOUS | Status: DC
  Administered 2019-09-06 – 2019-09-09 (×4): 2 g via INTRAVENOUS
  Filled 2019-09-06 (×3): qty 20
  Filled 2019-09-06: qty 2
  Filled 2019-09-06: qty 20

## 2019-09-06 MED ORDER — LOSARTAN POTASSIUM 50 MG PO TABS
100.0000 mg | ORAL_TABLET | Freq: Every evening | ORAL | Status: DC
  Administered 2019-09-06: 19:00:00 100 mg via ORAL
  Filled 2019-09-06: qty 2

## 2019-09-06 MED ORDER — SENNOSIDES-DOCUSATE SODIUM 8.6-50 MG PO TABS
1.0000 | ORAL_TABLET | Freq: Two times a day (BID) | ORAL | Status: DC
  Administered 2019-09-06 – 2019-09-10 (×8): 1 via ORAL
  Filled 2019-09-06 (×9): qty 1

## 2019-09-06 MED ORDER — ASPIRIN EC 81 MG PO TBEC
162.0000 mg | DELAYED_RELEASE_TABLET | Freq: Once | ORAL | Status: AC
  Administered 2019-09-06: 14:00:00 162 mg via ORAL
  Filled 2019-09-06: qty 2

## 2019-09-06 MED ORDER — HYDROXYZINE HCL 25 MG PO TABS
25.0000 mg | ORAL_TABLET | Freq: Three times a day (TID) | ORAL | Status: DC | PRN
  Administered 2019-09-06 – 2019-09-09 (×3): 50 mg via ORAL
  Filled 2019-09-06 (×3): qty 2

## 2019-09-06 NOTE — Progress Notes (Signed)
Amlodipine was held due to BP being 91/59.

## 2019-09-06 NOTE — H&P (Addendum)
History and Physical    Daisy [name] [id number] DOB: 1951/01/01 DOA: 09/05/2019  Referring MD/NP/PA: Leonie Man, MD Outpatient specialists: Delrae Rend, MD endocrinology;  Therisa Doyne PCP: Lawerance Cruel, MD  Patient coming from: [facility] transfer  Chief Complaint: Chest pain  I have personally briefly reviewed patient's old medical records in [place]   HPI: Daisy [name] is a 68 y.o. female with medical history significant of Addison's disease, renal insufficiency, relapsing MS, on chronic immunosuppressant, hypothyroidism, chronic pain s/p spinal stimulator, breast cancer s/p bilateral mastectomy, GERD, and esophageal dysmotility; who presented with complaints of fever, shortness of breath, and chest pain that woke the patient out of her sleep yesterday morning around 3:30 AM.  She describes it as a sharp and heavy feeling on the left side of her chest.  Complained of it being hard to take a deep breath in.  Reports that she is never had symptoms like this previously in the past.  She initially called her PCP later in the afternoon, and was advised to come to the hospital for further evaluation.  Patient complains of generalized malaise over the last 4 days after her visit with her PCP. While leaving the doctor's office she reported feeling dizzy, nauseous, and everything went black for a few seconds.  Patient thereafter vomited with emesis noted to be clear and thick for which she had a hard time getting it out.  Other associated symptoms include tingling sensation in her arms, but denies any loss of consciousness.   Denies any recent changes in her medication regimen.  Prior to this patient reports having a telemedicine visit back in July due to symptoms of nausea, difficulty swallowing, intermittent chills, and sweats.  She was treated twice with the Diflucan, then they added ondansetron for nausea. Last treated with doxycycline with a sinus infection.  However patient still  reports having persistent nausea with intermittent chills, and sweats since possibly back in May.  She was due to have repeat Botox injections with Dr. Therisa Doyne.  Lastly, patient reports significant family history of TB in multiple family members which she was around growing up.  Back in 1990 it appears patient was treated at least with 3 months of isoniazid side after seeing infectious disease, but did not tolerate the full 62-month course recommended due to headaches.   ED Course: On admission to the emergency department patient was found to be febrile up to to 101.3 F, pulse 98-124, respiration 14-26, and all other vital signs maintained.  Labs revealed WBC 10.2, lactic acid 2.1, CRP 3.9, BNP 31.8, high-sensitivity troponin 6, and d-dimer 0.59.  X-ray was otherwise noted to be clear.  COVID-19 and influenza screening was negative.  Urinalysis positive for ketones and elevated specific gravity.  EKG showing ST elevation significant and concerning for pericarditis.  CT scan of the chest without contrast due to allergies revealed mild fibrosis of the left upper lobe related with posttreatment changes, small hiatal hernia containing pill fragments, kidney stones, compression fracture of L1.  Patient was given full dose aspirin, 4 mg of morphine, and Zofran.  TRH called to admit for concern for pericarditis.   Review of Systems  Constitutional: Positive for diaphoresis, fever and malaise/fatigue.       Positive for 4 pound weight gain  HENT: Positive for congestion.   Eyes: Negative for photophobia and pain.  Respiratory: Positive for cough. Negative for shortness of breath.   Cardiovascular: Positive for chest pain and leg swelling.  Gastrointestinal: Positive  for nausea and vomiting. Negative for abdominal pain.  Genitourinary: Negative for dysuria, frequency and hematuria.  Musculoskeletal: Negative for falls.  Neurological: Positive for headaches. Negative for loss of consciousness and weakness.    Psychiatric/Behavioral: Negative for substance abuse. The patient does not have insomnia.     Past Medical History:  Diagnosis Date   Addison's disease ([place])    Adenocarcinoma of breast ([place]) 07/13/1992   LEFT LUMPECTOMY AND CHEMO AND RADIATION;   NOW HAS RIGHT BREAST MASS 09/25/14 - PLANS MASTECTOMY    Adrenal insufficiency ([facility])    "w/secondary Addison's"   Anemia    Arthritis    Asthma    using incentive spirometer alot now   Brittle bone disease    Bruises easily    Chronic bronchitis    Chronic pain    DR. MARK PHILLIPS WITH PAIN CLINIC -  CHRONIC BACK PAIN AND SCOLIOSIS AND PREVIOUS BACK SURGERY AND NOW LUMBAR FRACTURE - PT HAS [employer] SPINAL STIMULATOR WHICH SHE CAN TURN ON AND OFF ; PT ON MORPINE AND CLONAZEPAM AND ROBAXIN   COPD (chronic obstructive pulmonary disease) ([place])    DDD (degenerative disc disease), lumbar    Eye pain, right    last 4 months can be both eyes   Falls frequently    Family history of adverse reaction to anesthesia    ponv   Fibromyalgia    Fracture    LUMBAR 1 - FROM A FALL JUNE 2015 - PT HAS A LOT OF SEVERE BACK PAIN AND DIFFICULTY WALKING   GERD (gastroesophageal reflux disease)    H/O esophageal spasm    "I can have as many as 9 TUMS before they stop"   H/O hiatal hernia    Head injury with loss of consciousness ([place])    Headache(784.0)    "when I don't eat" (02/19/12)   Hepatitis B infection 1971   "in high school; after drinking after football player"   History of kidney stones    1 large stone now   History of urinary urgency    AZO HELPS - DOES TURN URINE ORANGE; DR. Risa Lorenzi IS PT'S UROLOGIST   Hypertension    Hypothyroidism    Mass    right chest wall   Migraines    "much better now" (02/19/12)   Multiple sclerosis ([place]) 05/1988   Relasping and Remitting"   OSA (obstructive sleep apnea)    Paget's disease of breast ([place]) 2010   left   Pneumonia    PONV (postoperative nausea and vomiting)     one episode 1993 vomiting after surgery   Poor venous access    PT STATES HER PORT A CATH NOT TO BE USED FOR SURGERY/ IV'S   Positive TB test 2017   "ran deep in my father's family; went on INH for awhile; it caused migraines"   Radiation adverse effect    "100% blockage on left; 50% on right salivary; can drain w/massage"   Restless leg syndrome, controlled 02/19/12   "much better since being on CPAP; been on that ~ 1 month now"   Shingles    Shortness of breath dyspnea    with exertion, improved, asthma improved   Sleep apnea    no cpap used due to pt took off all the time    Spinal headache    Stress incontinence in female    Upper GI bleeding 1970-1980   "had 4"    Past Surgical History:  Procedure Laterality Date   ABDOMINAL  HYSTERECTOMY  1987   APPENDECTOMY  1960's   BACK SURGERY  ~ 2004   "chipped disk out to put in permanent stimulator"   [place]   BIOPSY  06/11/2018   Procedure: BIOPSY;  Surgeon: Ronnette Juniper, MD;  Location: [facility];  Service: Gastroenterology;;   BIOPSY  12/05/2018   Procedure: BIOPSY;  Surgeon: Ronnette Juniper, MD;  [facility];  Service: Gastroenterology;;   BOTOX INJECTION N/A 11/26/2017   Procedure: BOTOX INJECTION;  Surgeon: Ronnette Juniper, MD;  Location: [place];  Service: Gastroenterology;  Laterality: N/A;   BOTOX INJECTION N/A 06/11/2018   Procedure: BOTOX INJECTION;  Surgeon: Ronnette Juniper, MD;  [facility];  Service: Gastroenterology;  Laterality: N/A;   BOTOX INJECTION  12/05/2018   Procedure: BOTOX INJECTION;  Surgeon: Ronnette Juniper, MD;  [facility];  Service: Gastroenterology;;   BREAST LUMPECTOMY  07/13/1992   left    CHOLECYSTECTOMY  1980's   COLONOSCOPY     CYSTOSCOPY WITH RETROGRADE PYELOGRAM, URETEROSCOPY AND STENT PLACEMENT Right 09/29/2015   Procedure: CYSTOSCOPY WITH RIGHT RETROGRADE PYELOGRAM, URETEROSCOPY AND STENT PLACEMENT,  STONE EXTRACTION WITH BASKET;  Surgeon: Rana Snare, MD;  [facility];  Service: Urology;  Laterality: Right;   ESOPHAGEAL MANOMETRY N/A 10/03/2017   Procedure: ESOPHAGEAL MANOMETRY (EM);  Surgeon: Ronnette Juniper, MD;  [facility];  Service: Gastroenterology;  Laterality: N/A;   ESOPHAGOGASTRODUODENOSCOPY N/A 04/17/2014   Procedure: ESOPHAGOGASTRODUODENOSCOPY (EGD);  Surgeon: Missy Sabins, MD;  [facility];  Service: Endoscopy;  Laterality: N/A;   ESOPHAGOGASTRODUODENOSCOPY N/A 11/26/2017   Procedure: ESOPHAGOGASTRODUODENOSCOPY (EGD);  Surgeon: Ronnette Juniper, MD;  Location: [place];  Service: Gastroenterology;  Laterality: N/A;   ESOPHAGOGASTRODUODENOSCOPY N/A 06/11/2018   Procedure: ESOPHAGOGASTRODUODENOSCOPY (EGD);  Surgeon: Ronnette Juniper, MD;  Location[facility];  Service: Gastroenterology;  Laterality: N/A;   ESOPHAGOGASTRODUODENOSCOPY N/A 12/05/2018   Procedure: ESOPHAGOGASTRODUODENOSCOPY (EGD);  Surgeon: Ronnette Juniper, MD;  Location[facility];  Service: Gastroenterology;  Laterality: N/A;  w/ botox injection   EXTRACORPOREAL SHOCK WAVE LITHOTRIPSY  2016   fracture Left 03/2018   left foot fracture   HOLMIUM LASER APPLICATION Right [id number]   Procedure: HOLMIUM LASER LITHOTRIPSY ;  Surgeon: Rana Snare, MD;  [facility];  Service: Urology;  Laterality: Right;   LAPAROSCOPY     MASS EXCISION Right 07/24/2018   Procedure: EXCISION OF RIGHT CHEST WALL MASS ERAS PATHWAY;  Surgeon: Coralie Keens, MD;  [facility];  Service: General;  Laterality: Right;   MASTECTOMY  2010   left    MASTECTOMY Right 10/15   PORT-A-CATH REMOVAL Right 09/25/2016   Procedure: REMOVAL PORT-A-CATH RIGHT;  Surgeon: Jackolyn Confer, MD;  Location: [place];  Service: General;  Laterality: Right;   PORTACATH PLACEMENT  10/30/2012   Procedure: INSERTION PORT-A-CATH;  Surgeon: Odis Hollingshead, MD;  [facility];  Service: General;  Laterality: Right;  Right  Ultrasound guided port-a-cath insertion   SALIVARY GLAND SURGERY  ~ 2002   "left was 100% blocked twice; right 50%; only had one surgery"   SALIVARY GLAND SURGERY LEFT     SAVORY DILATION N/A 04/17/2014   Procedure: SAVORY DILATION;  Surgeon: Missy Sabins, MD;  [facility];  Service: Endoscopy;  Laterality: N/A;   SPINAL CORD STIMULATOR INSERTION  ~ 2004   SPINAL CORD STIMULATOR INSERTION N/A 04/09/2015   Procedure: Spinal cord stimulator revision and replacement of paddle electrode;  Surgeon: Kristeen Miss, MD;  Location: [facility];  Service: Neurosurgery;  Laterality: N/A;  Spinal cord stimulator revision and replacement of paddle electrode   TONSILLECTOMY AND ADENOIDECTOMY  1964   TOTAL MASTECTOMY Right 09/25/2014   Procedure: RIGHT MASTECTOMY;  Surgeon: Jackolyn Confer, MD;  [facility];  Service: General;  Laterality: Right;     reports that she has never smoked. She has never used smokeless tobacco. She reports that she does not drink alcohol or use drugs.  Allergies  Allergen Reactions   Fosamax [Alendronate] Other (See Comments)    ESOPHAGEAL IRRITATION DANGER OF ESOPHAGEAL PERF.   Gabapentin Anaphylaxis   Iodine-131 Anaphylaxis   Iohexol Anaphylaxis and Other (See Comments)    ANAPHYLACTIC REACTION!-kmr-10/16/05    Ivp Dye [Iodinated Diagnostic Agents] Anaphylaxis and Other (See Comments)    Fever and blood clot   Procaine Anaphylaxis and Other (See Comments)    novocaine caused shaking, rash, chest heaviness, made patient feel sick   Sulfa Antibiotics Swelling and Other (See Comments)    SWELLING REACTION UNSPECIFIED  HALLUCINATIONS    Terbinafine Hcl Anaphylaxis and Other (See Comments)    THROAT CLOSED UP   Ambien [Zolpidem] Nausea And Vomiting and Other (See Comments)    SHAKEY   Bactrim [Sulfamethoxazole-Trimethoprim] Other (See Comments)    ALTERED MENTAL STATUS HALLUCINATIONS    Compazine Other (See Comments)    Hyper and  shaky   Dilaudid [Hydromorphone Hcl] Nausea And Vomiting and Other (See Comments)    HEADACHES   Guaifenesin Nausea And Vomiting, Swelling and Other (See Comments)    SWELLING REACTION UNSPECIFIED  HYPERACTIVE   Hydromorphone Nausea And Vomiting and Other (See Comments)    HEADACHE >> MIGRAINE   Lisinopril Cough   Methocarbamol Other (See Comments)    "Makes pt feel irritable "   Metoclopramide Hcl Other (See Comments)    Hyper, shaky   Promethazine Anxiety and Other (See Comments)    DIZZINESS   Vancomycin Swelling and Other (See Comments)    SWELLING REACTION UNSPECIFIED    Aldara [Imiquimod] Other (See Comments)    "Active and sick "   Amlodipine Swelling and Other (See Comments)    SWELLING REACTION (patient is currently taking with mild fluid retention)   Clindamycin/Lincomycin Other (See Comments)    UNSPECIFIED REACTION    Trifluoperazine Other (See Comments)    Hallucination    Augmentin [Amoxicillin-Pot Clavulanate] Diarrhea and Other (See Comments)    Bad headaches Has patient had a PCN reaction causing immediate rash, facial/tongue/throat swelling, SOB or lightheadedness with hypotension: no Has patient had a PCN reaction causing severe rash involving mucus membranes or skin necrosis: no Has patient had a PCN reaction that required hospitalization no Has patient had a PCN reaction occurring within the last 10 years: no If all of the above answers are "NO", then may proceed with Cephalosporin use.    Biaxin [Clarithromycin] Anxiety and Other (See Comments)    jittery   Carafate [Sucralfate] Nausea Only and Other (See Comments)    Distends stomach.    Guaifenesin Anxiety and Other (See Comments)    Headache   Other Hives and Other (See Comments)    SOME TAPES CAUSE SKIN IRRITATION   Prochlorperazine Anxiety    Family History  Problem Relation Age of Onset   Lung cancer Paternal Uncle        smoker   Breast cancer Cousin 72       maternal  cousin   Throat cancer Cousin  maternal cousin    Prior to Admission medications   Medication Sig Start Date End Date Taking? Authorizing Provider  amLODipine (NORVASC) 2.5 MG tablet Take 2.5 mg by mouth daily.    [provider]  Calcium-Vitamin D-Vitamin K (VIACTIV) [id number] MG-UNT-MCG CHEW Chew 3 each by mouth daily.     [provider]  hydrOXYzine (ATARAX/VISTARIL) 25 MG tablet Take 25-50 mg by mouth every 8 (eight) hours as needed for nausea or vomiting.     [provider]  levothyroxine (SYNTHROID, LEVOTHROID) 50 MCG tablet Take 50-75 mcg by mouth See admin instructions. Take 1 tablet (50 mcg) by mouth daily on even days alternating with 1.5 tablets (75 mcg) by mouth daily on odd days    [provider]  losartan (COZAAR) 100 MG tablet Take 100 mg by mouth every evening.  08/03/17   [provider]  morphine (MSIR) 30 MG tablet Take 30 mg by mouth every 6 (six) hours as needed (Scoliosis pain).     [provider]  oxyCODONE (OXY IR/ROXICODONE) 5 MG immediate release tablet Take 1 tablet (5 mg total) by mouth every 6 (six) hours as needed for moderate pain or severe pain. Patient not taking: Reported on 11/22/2018 07/24/18   Coralie Keens, MD  pantoprazole (PROTONIX) 40 MG tablet Take 40 mg by mouth daily.    [provider]  predniSONE (DELTASONE) 1 MG tablet Take 4 mg by mouth daily with breakfast.    [provider]  senna-docusate (SENOKOT-S) 8.6-50 MG tablet Take 1 tablet 2 (two) times daily by mouth. 10/18/17   Florencia Reasons, MD  valACYclovir (VALTREX) 1000 MG tablet Take 1,000 mg by mouth 3 (three) times daily as needed (for shingles).     [provider]  spironolactone (ALDACTONE) 25 MG tablet Take 25 mg by mouth 2 (two) times daily. For swelling   02/19/12  [provider]    Physical Exam:  Constitutional: Elderly female appears chronically ill  Vitals:   09/06/19 0549 09/06/19  0700 09/06/19 0745 09/06/19 0847  BP:  105/63  132/77  Pulse:  95 98 100  Resp:  14 14 18   Temp: 98.6 F (37 C)   98.1 F (36.7 C)  TempSrc: Oral   Oral  SpO2:  90% 94% 94%  Weight:    63 kg  Height:    5' 2.5" (1.588 m)   Eyes: PERRL, lids and conjunctivae normal ENMT: Mucous membranes are moist. Posterior pharynx clear of any exudate or lesions.  Neck: normal, supple, no masses, no thyromegaly Respiratory: clear to auscultation bilaterally, no wheezing, no crackles. Normal respiratory effort. No accessory muscle use.  Cardiovascular: Regular rate and rhythm, no murmurs / rubs / gallops. No extremity edema. 2+ pedal pulses. No carotid bruits.  Abdomen: no tenderness, no masses palpated. No hepatosplenomegaly. Bowel sounds positive.  Musculoskeletal: no clubbing / cyanosis. No joint deformity upper and lower extremities. Good ROM, no contractures. Normal muscle tone.  Skin: no rashes, lesions, ulcers. No induration Neurologic: CN 2-12 grossly intact. Sensation intact, DTR normal. Strength 5/5 in all 4.  Psychiatric: Normal judgment and insight. Alert and oriented x 3. Normal mood.     Labs on Admission: I have personally reviewed following labs and imaging studies  CBC: Recent Labs  Lab 09/05/19 1711  WBC 10.2  NEUTROABS 7.4  HGB 14.1  HCT 44.1  MCV 83.7  PLT [id number]   Basic Metabolic Panel: Recent Labs  Lab 09/05/19 1711  NA 135  K 4.7  CL 95*  CO2 24  GLUCOSE 68*  BUN 13  CREATININE 0.85  CALCIUM 9.0   GFR: Estimated Creatinine Clearance: 56.8 mL/min (by C-G formula based on SCr of 0.85 mg/dL). Liver Function Tests: Recent Labs  Lab 09/05/19 1711  AST 31  ALT 12  ALKPHOS 58  BILITOT 0.9  PROT 7.7  ALBUMIN 4.1   Recent Labs  Lab 09/05/19 1711  LIPASE 15   Recent Labs  Lab 09/05/19 1831  AMMONIA 22   Coagulation Profile: No results for input(s): INR, PROTIME in the last 168 hours. Cardiac Enzymes: No results for input(s): CKTOTAL, CKMB,  CKMBINDEX, TROPONINI in the last 168 hours. BNP (last 3 results) No results for input(s): PROBNP in the last 8760 hours. HbA1C: No results for input(s): HGBA1C in the last 72 hours. CBG: No results for input(s): GLUCAP in the last 168 hours. Lipid Profile: No results for input(s): CHOL, HDL, LDLCALC, TRIG, CHOLHDL, LDLDIRECT in the last 72 hours. Thyroid Function Tests: Recent Labs    09/05/19 1725  TSH 0.547   Anemia Panel: No results for input(s): VITAMINB12, FOLATE, FERRITIN, TIBC, IRON, RETICCTPCT in the last 72 hours. Urine analysis:    Component Value Date/Time   COLORURINE YELLOW 09/05/2019 2016   APPEARANCEUR CLEAR 09/05/2019 2016   LABSPEC >1.030 (H) 09/05/2019 2016   PHURINE 5.5 09/05/2019 2016   GLUCOSEU NEGATIVE 09/05/2019 2016   HGBUR NEGATIVE 09/05/2019 2016   BILIRUBINUR SMALL (A) 09/05/2019 2016   KETONESUR >80 (A) 09/05/2019 2016   PROTEINUR NEGATIVE 09/05/2019 2016   UROBILINOGEN 1.0 05/17/2014 1753   NITRITE NEGATIVE 09/05/2019 2016   LEUKOCYTESUR NEGATIVE 09/05/2019 2016   Sepsis Labs: Recent Results (from the past 240 hour(s))  SARS Coronavirus 2 [facility] order, Performed in [facility] hospital lab) Nasopharyngeal Nasopharyngeal Swab     Status: None   Collection Time: 09/05/19  5:11 PM   Specimen: Nasopharyngeal Swab  Result Value Ref Range Status   SARS Coronavirus 2 NEGATIVE NEGATIVE Final    Comment: (NOTE) If result is NEGATIVE SARS-CoV-2 target nucleic acids are NOT DETECTED. The SARS-CoV-2 RNA is generally detectable in upper and lower  respiratory specimens during the acute phase of infection. The lowest  concentration of SARS-CoV-2 viral copies this assay can detect is 250  copies / mL. A negative result does not preclude SARS-CoV-2 infection  and should not be used as the sole basis for treatment or other  patient management decisions.  A negative result may occur with  improper specimen collection / handling, submission of specimen  other  than nasopharyngeal swab, presence of viral mutation(s) within the  areas targeted by this assay, and inadequate number of viral copies  (<250 copies / mL). A negative result must be combined with clinical  observations, patient history, and epidemiological information. If result is POSITIVE SARS-CoV-2 target nucleic acids are DETECTED. The SARS-CoV-2 RNA is generally detectable in upper and lower  respiratory specimens dur ing the acute phase of infection.  Positive  results are indicative of active infection with SARS-CoV-2.  Clinical  correlation with patient history and other diagnostic information is  necessary to determine patient infection status.  Positive results do  not rule out bacterial infection or co-infection with other viruses. If result is PRESUMPTIVE POSTIVE SARS-CoV-2 nucleic acids MAY BE PRESENT.   A presumptive positive result was obtained on the submitted specimen  and confirmed on repeat testing.  While 2019 novel coronavirus  (SARS-CoV-2) nucleic acids may be present in  the submitted sample  additional confirmatory testing may be necessary for epidemiological  and / or clinical management purposes  to differentiate between  SARS-CoV-2 and other Sarbecovirus currently known to infect humans.  If clinically indicated additional testing with an alternate test  methodology [phone]) is advised. The SARS-CoV-2 RNA is generally  detectable in upper and lower respiratory sp ecimens during the acute  phase of infection. The expected result is Negative. Fact Sheet for Patients:  StrictlyIdeas.no Fact Sheet for Healthcare Providers: BankingDealers.co.za This test is not yet approved or cleared by the [place] FDA and has been authorized for detection and/or diagnosis of SARS-CoV-2 by FDA under an Emergency Use Authorization (EUA).  This EUA will remain in effect (meaning this test can be used) for the duration  of the COVID-19 declaration under Section 564(b)(1) of the Act, 21 U.S.C. section 360bbb-3(b)(1), unless the authorization is terminated or revoked sooner. Performed at [facility], [address] [place], [place] [address]      Radiological Exams on Admission: Ct Chest Wo Contrast  Result Date: 09/06/2019 CLINICAL DATA:  Shortness of breath EXAM: CT CHEST WITHOUT CONTRAST TECHNIQUE: Multidetector CT imaging of the chest was performed following the standard protocol without IV contrast. COMPARISON:  Chest x-ray 09/05/2019, CT chest 03/22/2015 FINDINGS: Cardiovascular: Limited evaluation without intravenous contrast. Mild aortic atherosclerosis. No aneurysmal dilatation. Negative for acute intramural hematoma. Heart size is normal. No significant pericardial effusion Mediastinum/Nodes: Midline trachea. Normal thyroid. No significantly enlarged lymph nodes. Small hiatal hernia containing pill fragments or dense debris distally. Lungs/Pleura: No acute consolidation or effusion. Mild subpleural fibrosis and scarring in the left upper lobe and lingula Upper Abdomen: Partially visualized kidney stones bilaterally. Clips in the gallbladder fossa. Splenic granuloma. Dilated extrahepatic common bile duct up to 14 mm. This is unchanged. Musculoskeletal: Scoliosis of the spine. Chronic compression fracture at L1. Post mastectomy changes on the left IMPRESSION: 1. No focal airspace disease, pleural effusion or pneumothorax. Mild fibrosis in the left upper lobe, likely related to post treatment change 2. Small hiatal hernia containing pill fragments or dense debris within the distal lumen 3. Kidney stones 4. Chronic compression fracture at L1 Aortic Atherosclerosis (ICD10-I70.0). Electronically Signed   By: Donavan Foil M.D.   On: 09/06/2019 01:28   Dg Chest Portable 1 View  Result Date: 09/05/2019 CLINICAL DATA:  Cough, fever EXAM: PORTABLE CHEST 1 VIEW COMPARISON:  Radiograph 01/17/2018, CT  03/22/2015 FINDINGS: Lower cervical spine fusion hardware is seen. Left axillary surgical clips are noted. Postsurgical changes from prior bilateral mastectomy. Spinal nerve stimulator is noted. Cardiac monitoring leads overlie the chest. Bandlike region of scarring in the left mid lung is similar to prior. No consolidation, features of edema, pneumothorax, or effusion. Cardiomediastinal contours are unchanged from comparison studies accounting for differences in technique. The aorta is tortuous and partially calcified. No acute osseous or soft tissue abnormality. IMPRESSION: No acute cardiopulmonary abnormality, specifically no evidence of pneumonia. Electronically Signed   By: Lovena Le M.D.   On: 09/05/2019 18:34    EKG: Independently reviewed.  Tachycardia 110 bpm with ST changes concerning for pericarditis versus repolarization  Assessment/Plan SIRS/sepsis: Patient presented with fever up to 101.3 F, change and tachypnea.  WBC 10.2 and lactic acid 2.1.  CRP 3.9 concerning for underlying infection.  COVID-19 and influenza screening was negative.  CT scan of the chest did not show any pneumonia, but did note small hiatal hernia.  Unclear cause of patient's symptoms currently at this  time.  She reports remote history of TB, but appears less likely bases off CT scan.  Discussed case with infectious disease who recommended conservative treatment at this time. -Admit to medical telemetry. -Check blood cultures -Check MRSA nasal swab -Check procalcitonin -Empiric antibiotics of Rocephin -May warrant broadening antibiotics to patient's symptoms worsen or cultures warrant -Trend lactic acid -Patient declined to be placed on IV fluids   Chest pain/question pericarditis: Acute.  Patient presents with complaints of shortness of breath and left-sided chest pain.  Initial troponin negative.  EKG with ST wave changes concerning possibly for pericarditis.  On the differential also should include left foot  esophagitis. -Check echocardiogram(echo noting EF of 60-65% similar to previous it appears) -Will consider consulting cardiology if echo abnormal  Adrenal insufficiency: Patient on chronic steroids of 4 mg daily and vital signs currently stable. -Continue steroids and consider need of stress dosing if clinical decline  Relapsing MS: Appears stable continue to monitor  Hypothyroidism: TSH 0.547 on admission -Continue levothyroxine   Gerd/esophageal dysmotility/small hiatal hernia: Acute on chronic.  Patient followed in outpatient setting by Dr. Therisa Doyne. -Consider need of formal consulting GI if symptoms do not improve and other causes ruled out  Chronic back pain: Patient s/p spinal stimulator on morphine. -Continue current pain regimen  Essential hypertension -Continue losartan and amlodipine  History of breast cancer: Status post mastectomy.  DVT prophylaxis: lovenox  Code Status: Full Family Communication: No family present at bedside Disposition Plan: To be determined Consults called: Discussed case over the phone with ID, recommend reconsultation if needed Admission status: inpatient  Norval Morton MD [facility] Pager [phone]   If 7PM-7AM, please contact night-coverage [email or web address] Password [email or web address]  09/06/2019, 10:11 AM

## 2019-09-06 NOTE — Progress Notes (Signed)
  Echocardiogram 2D Echocardiogram has been performed.  Daisy [name] 09/06/2019, 11:42 AM

## 2019-09-06 NOTE — Plan of Care (Signed)
68 yo f with hx of addison, renal insuf, MS, on steroids, Br Ca with bilateral Mastectomy Comes in with fever, SOB, CP ECG worrisome for pericarditis Flu test pending covid negative D.dimer positive Called cards ESR wnl CRP elevated  Trop neg Attempted bedside echo No big effusion Needs real echo Need to re consult Cards in AM   Has multiple allergies no antibiotics were started bc unclear what is treating   BP 127/83  Accepted to tele bed   Inpatient 12:35 AM Daisy [name]

## 2019-09-06 NOTE — ED Notes (Signed)
Patient assisted to bathroom; ambulatory with minimal assistance.

## 2019-09-07 DIAGNOSIS — K219 Gastro-esophageal reflux disease without esophagitis: Secondary | ICD-10-CM

## 2019-09-07 DIAGNOSIS — I10 Essential (primary) hypertension: Secondary | ICD-10-CM

## 2019-09-07 DIAGNOSIS — K224 Dyskinesia of esophagus: Secondary | ICD-10-CM

## 2019-09-07 LAB — CBC
HCT: 38.6 % (ref 36.0–46.0)
Hemoglobin: 13 g/dL (ref 12.0–15.0)
MCH: 28.1 pg (ref 26.0–34.0)
MCHC: 33.7 g/dL (ref 30.0–36.0)
MCV: 83.4 fL (ref 80.0–100.0)
Platelets: 248 10*3/uL (ref 150–400)
RBC: 4.63 MIL/uL (ref 3.87–5.11)
RDW: 20.6 % — ABNORMAL HIGH (ref 11.5–15.5)
WBC: 5.8 10*3/uL (ref 4.0–10.5)
nRBC: 0.3 % — ABNORMAL HIGH (ref 0.0–0.2)

## 2019-09-07 LAB — BASIC METABOLIC PANEL
Anion gap: 14 (ref 5–15)
BUN: 26 mg/dL — ABNORMAL HIGH (ref 8–23)
CO2: 22 mmol/L (ref 22–32)
Calcium: 8.3 mg/dL — ABNORMAL LOW (ref 8.9–10.3)
Chloride: 98 mmol/L (ref 98–111)
Creatinine, Ser: 1.05 mg/dL — ABNORMAL HIGH (ref 0.44–1.00)
GFR calc Af Amer: >60 mL/min (ref >60)
GFR calc non Af Amer: 55 mL/min — ABNORMAL LOW (ref >60)
Glucose, Bld: 87 mg/dL (ref 70–99)
Potassium: 3.4 mmol/L — ABNORMAL LOW (ref 3.5–5.1)
Sodium: 134 mmol/L — ABNORMAL LOW (ref 135–145)

## 2019-09-07 LAB — SEDIMENTATION RATE: Sed Rate: 28 mm/hr — ABNORMAL HIGH (ref 0–22)

## 2019-09-07 LAB — C-REACTIVE PROTEIN: CRP: 20.2 mg/dL — ABNORMAL HIGH (ref <1.0)

## 2019-09-07 LAB — TROPONIN I (HIGH SENSITIVITY): Troponin I (High Sensitivity): 6 ng/L (ref <18)

## 2019-09-07 MED ORDER — TRAMADOL HCL 50 MG PO TABS
50.0000 mg | ORAL_TABLET | Freq: Three times a day (TID) | ORAL | Status: DC
  Administered 2019-09-07: 50 mg via ORAL
  Filled 2019-09-07 (×3): qty 1

## 2019-09-07 MED ORDER — PANTOPRAZOLE SODIUM 40 MG IV SOLR
40.0000 mg | Freq: Two times a day (BID) | INTRAVENOUS | Status: DC
  Administered 2019-09-07 – 2019-09-09 (×5): 40 mg via INTRAVENOUS
  Filled 2019-09-07 (×6): qty 40

## 2019-09-07 MED ORDER — PREDNISONE 5 MG PO TABS
8.0000 mg | ORAL_TABLET | Freq: Every day | ORAL | Status: DC
  Filled 2019-09-07: qty 3

## 2019-09-07 MED ORDER — POTASSIUM CHLORIDE CRYS ER 20 MEQ PO TBCR
40.0000 meq | EXTENDED_RELEASE_TABLET | Freq: Once | ORAL | Status: AC
  Administered 2019-09-07: 40 meq via ORAL
  Filled 2019-09-07: qty 2

## 2019-09-07 MED ORDER — PREDNISONE 1 MG PO TABS
4.0000 mg | ORAL_TABLET | Freq: Once | ORAL | Status: AC
  Administered 2019-09-08: 4 mg via ORAL
  Filled 2019-09-07 (×2): qty 4

## 2019-09-07 MED ORDER — PANTOPRAZOLE SODIUM 40 MG PO TBEC
40.0000 mg | DELAYED_RELEASE_TABLET | Freq: Two times a day (BID) | ORAL | Status: DC
  Administered 2019-09-07: 40 mg via ORAL
  Filled 2019-09-07: qty 1

## 2019-09-07 MED ORDER — SODIUM CHLORIDE 0.9 % IV SOLN
INTRAVENOUS | Status: DC
  Administered 2019-09-07 – 2019-09-08 (×2): via INTRAVENOUS

## 2019-09-07 NOTE — Progress Notes (Signed)
Patient HR 135 according to central tele. Patient alert and oriented. Stated she was sitting up in bed.

## 2019-09-07 NOTE — Plan of Care (Signed)

## 2019-09-07 NOTE — Consult Note (Signed)
Subjective:   HPI  The patient is a 68 year old female with multiple medical problems. She was admitted to the hospital after going to the [facility] emergency department with complaints of shortness of breath and left-sided chest pain. She also had a fever. CT of the chest was essentially unrevealing for anything serious. Covid 19 test was negative. The primary team called the infectious disease doctor who suggested conservative treatment. She seems to be doing better than she was but still has a little pain on the left side of her chest. In reviewing the chart pericarditis was suspected.  GI was consulted to see if there could be a possible esophageal reason for her problem. The patient has had several diagnoses of her esophagus and past. She has a history of reflux esophagitis with ulceration which was noted on EGD in 2019. In 2018 she had an esophageal stenosis which was dilated. Also in 2018 she had an esophageal manometry which showed esophagogastric junction out low obstruction with normal peristalsis. She was treated for this on 2 separate occasions with Botox injections at the EG junction. The last one of these was in January 2020. She complains of ongoing dysphagia and she also has heartburn.     Past Medical History:  Diagnosis Date  . Addison's disease ([place])   . Adenocarcinoma of breast ([place]) 07/13/1992   LEFT LUMPECTOMY AND CHEMO AND RADIATION;   NOW HAS RIGHT BREAST MASS 09/25/14 - PLANS MASTECTOMY   . Adrenal insufficiency ([place])    "w/secondary Addison's"  . Anemia   . Arthritis   . Asthma    using incentive spirometer alot now  . Brittle bone disease   . Bruises easily   . Chronic bronchitis   . Chronic pain    DR. MARK PHILLIPS WITH PAIN CLINIC -  CHRONIC BACK PAIN AND SCOLIOSIS AND PREVIOUS BACK SURGERY AND NOW LUMBAR FRACTURE - PT HAS [employer] SPINAL STIMULATOR WHICH SHE CAN TURN ON AND OFF ; PT ON MORPINE AND CLONAZEPAM AND ROBAXIN  . COPD (chronic obstructive  pulmonary disease) ([place])   . DDD (degenerative disc disease), lumbar   . Eye pain, right    last 4 months can be both eyes  . Falls frequently   . Family history of adverse reaction to anesthesia    ponv  . Fibromyalgia   . Fracture    LUMBAR 1 - FROM A FALL JUNE 2015 - PT HAS A LOT OF SEVERE BACK PAIN AND DIFFICULTY WALKING  . GERD (gastroesophageal reflux disease)   . H/O esophageal spasm    "I can have as many as 31 TUMS before they stop"  . H/O hiatal hernia   . Head injury with loss of consciousness ([place])   . Headache(784.0)    "when I don't eat" (02/19/12)  . 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 History of kidney stones    1 large stone now  . History of urinary urgency    AZO HELPS - DOES TURN URINE ORANGE; DR. Risa Nyman IS PT'S UROLOGIST  . Hypertension   . Hypothyroidism   . Mass    right chest wall  . Migraines    "much better now" (02/19/12)  . Multiple sclerosis ([place]) 05/1988   Relasping and Remitting"  . OSA (obstructive sleep apnea)   . 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 Pneumonia   . PONV (postoperative nausea and vomiting)  one episode 1993 vomiting after surgery  . Poor venous access    PT STATES HER PORT A CATH NOT TO BE USED FOR SURGERY/ IV'S  . Positive TB test 2017   "ran deep in my father's family; went on INH for awhile; it caused migraines"  . Radiation adverse effect    "100% blockage on left; 50% on right salivary; can drain w/massage"  . Restless leg syndrome, controlled 02/19/12   "much better since being on CPAP; been on that ~ 1 month now"  . Shingles   . Shortness of breath dyspnea    with exertion, improved, asthma improved  . Sleep apnea    no cpap used due to pt took off all the time   . Spinal headache   . Stress incontinence in female   . Upper GI bleeding 1970-1980   "had 4"   Past Surgical History:  Procedure Laterality Date  . ABDOMINAL HYSTERECTOMY  1987  .  APPENDECTOMY  1960's  . BACK SURGERY  ~ 2004   "chipped disk out to put in permanent stimulator"  . BAND HEMORRHOIDECTOMY  1979  . BILATERAL OOPHORECTOMY  1988  . BIOPSY  06/11/2018   Procedure: BIOPSY;  Surgeon: Ronnette Juniper, MD;  Location: [facility];  Service: Gastroenterology;;  . BIOPSY  12/05/2018   Procedure: BIOPSY;  Surgeon: Ronnette Juniper, MD;  Location: [facility];  Service: Gastroenterology;;  . Lum [place] INJECTION N/A 11/26/2017   Procedure: BOTOX INJECTION;  Surgeon: Ronnette Juniper, MD;  Location: [place];  Service: Gastroenterology;  Laterality: N/A;  . BOTOX INJECTION N/A 06/11/2018   Procedure: BOTOX INJECTION;  Surgeon: Ronnette Juniper, MD;  Location: [facility];  Service: Gastroenterology;  Laterality: N/A;  . BOTOX INJECTION  12/05/2018   Procedure: BOTOX INJECTION;  Surgeon: Ronnette Juniper, MD;  Location: [facility];  Service: Gastroenterology;;  . BREAST LUMPECTOMY  07/13/1992   left   . CHOLECYSTECTOMY  1980's  . COLONOSCOPY    . CYSTOSCOPY WITH RETROGRADE PYELOGRAM, URETEROSCOPY AND STENT PLACEMENT Right 09/29/2015   Procedure: CYSTOSCOPY WITH RIGHT RETROGRADE PYELOGRAM, URETEROSCOPY AND STENT PLACEMENT, STONE EXTRACTION WITH BASKET;  Surgeon: Rana Snare, MD;  [facility];  Service: Urology;  Laterality: Right;  . ESOPHAGEAL MANOMETRY N/A 10/03/2017   Procedure: ESOPHAGEAL MANOMETRY (EM);  Surgeon: Ronnette Juniper, MD;  [facility];  Service: Gastroenterology;  Laterality: N/A;  . ESOPHAGOGASTRODUODENOSCOPY N/A 04/17/2014   Procedure: ESOPHAGOGASTRODUODENOSCOPY (EGD);  Surgeon: Missy Sabins, MD;  Location: [facility];  Service: Endoscopy;  Laterality: N/A;  . ESOPHAGOGASTRODUODENOSCOPY N/A 11/26/2017   Procedure: ESOPHAGOGASTRODUODENOSCOPY (EGD);  Surgeon: Ronnette Juniper, MD;  Location: [place];  Service: Gastroenterology;  Laterality: N/A;  . ESOPHAGOGASTRODUODENOSCOPY N/A 06/11/2018   Procedure: ESOPHAGOGASTRODUODENOSCOPY (EGD);  Surgeon: Ronnette Juniper, MD;   Location: [facility];  Service: Gastroenterology;  Laterality: N/A;  . ESOPHAGOGASTRODUODENOSCOPY N/A 12/05/2018   Procedure: ESOPHAGOGASTRODUODENOSCOPY (EGD);  Surgeon: Ronnette Juniper, MD;  Location: [facility];  Service: Gastroenterology;  Laterality: N/A;  w/ botox injection  . EXTRACORPOREAL SHOCK WAVE LITHOTRIPSY  2016  . fracture Left 03/2018   left foot fracture  . HOLMIUM LASER APPLICATION Right [id number]   Procedure: HOLMIUM LASER LITHOTRIPSY ;  Surgeon: Rana Snare, MD;  [facility];  Service: Urology;  Laterality: Right;  . LAPAROSCOPY    . MASS EXCISION Right 07/24/2018   Procedure: EXCISION OF RIGHT CHEST WALL MASS ERAS PATHWAY;  Surgeon: Coralie Keens, MD;  Location: [facility];  Service: General;  Laterality: Right;  . MASTECTOMY  2010   left   . MASTECTOMY Right 10/15  . PORT-A-CATH REMOVAL Right 09/25/2016   Procedure: REMOVAL PORT-A-CATH RIGHT;  Surgeon: Jackolyn Confer, MD;  Location: [place];  Service: General;  Laterality: Right;  . PORTACATH PLACEMENT  10/30/2012   Procedure: INSERTION PORT-A-CATH;  Surgeon: Odis Hollingshead, MD;  Location: [facility];  Service: General;  Laterality: Right;  Right Ultrasound guided port-a-cath insertion  . SALIVARY GLAND SURGERY  ~ 2002   "left was 100% blocked twice; right 50%; only had one surgery"  . SALIVARY GLAND SURGERY LEFT    . SAVORY DILATION N/A 04/17/2014   Procedure: SAVORY DILATION;  Surgeon: Missy Sabins, MD;  [facility]: [facility];  Service: Endoscopy;  Laterality: N/A;  . SPINAL CORD STIMULATOR INSERTION  ~ 2004  . SPINAL CORD STIMULATOR INSERTION N/A 04/09/2015   Procedure: Spinal cord stimulator revision and replacement of paddle electrode;  Surgeon: Kristeen Miss, MD;  Location: [facility];  Service: Neurosurgery;  Laterality: N/A;  Spinal cord stimulator revision and replacement of paddle electrode  . TONSILLECTOMY AND ADENOIDECTOMY  1964  . TOTAL MASTECTOMY Right 09/25/2014   Procedure: RIGHT MASTECTOMY;  Surgeon:  Jackolyn Confer, MD;  Location: [facility];  Service: General;  Laterality: Right;   Social History   Socioeconomic History  . Marital status: Married    Spouse name: Not on file  . Number of children: Not on file  . Years of education: Not on file  . Highest education level: Not on file  Occupational History  . Not on file  Social Needs  . Financial resource strain: Not on file  . Food insecurity    Worry: Not on file    Inability: Not on file  . Transportation needs    Medical: Not on file    Non-medical: Not on file  Tobacco Use  . Smoking status: Never Smoker  . Smokeless tobacco: Never Used  Substance and Sexual Activity  . Alcohol use: No  . Drug use: No  . Sexual activity: Not Currently  Lifestyle  . Physical activity    Days per week: Not on file    Minutes per session: Not on file  . Stress: Not on file  Relationships  . Social Herbalist on phone: Not on file    Gets together: Not on file    Attends religious service: Not on file    Active member of club or organization: Not on file    Attends meetings of clubs or organizations: Not on file    Relationship status: Not on file  . Intimate partner violence    Fear of current or ex partner: Not on file    Emotionally abused: Not on file    Physically abused: Not on file    Forced sexual activity: Not on file  Other Topics Concern  . Not on file  Social History Narrative  . Not on file   family history includes Breast cancer (age of onset: 68) in her cousin; Lung cancer in her paternal uncle; Throat cancer in her cousin.  Current Facility-Administered Medications:  .  cefTRIAXone (ROCEPHIN) 2 g in sodium chloride 0.9 % 100 mL IVPB, 2 g, Intravenous, Q24H, Smith, Rondell A, MD, Last Rate: 200 mL/hr at 09/07/19 1411, 2 g at 09/07/19 1411 .  diphenhydrAMINE (BENADRYL) capsule 25 mg, 25 mg, Oral, Q8H PRN, Smith, Rondell A, MD .  enoxaparin (LOVENOX) injection 40 mg, 40 mg, Subcutaneous, Q24H, Smith,  Rondell A,  MD, 40 mg at 09/07/19 0829 .  hydrOXYzine (ATARAX/VISTARIL) tablet 25-50 mg, 25-50 mg, Oral, Q8H PRN, Fuller Plan A, MD, 50 mg at 09/06/19 1900 .  levothyroxine (SYNTHROID) tablet 50 mcg, 50 mcg, Oral, QODAY, Doutova, Anastassia, MD, 50 mcg at 09/07/19 0604 .  levothyroxine (SYNTHROID) tablet 75 mcg, 75 mcg, Oral, QODAY, Doutova, Anastassia, MD, 75 mcg at 09/06/19 1841 .  lip balm (CARMEX) ointment, , Topical, PRN, [place], Yujing Z, RPH .  losartan (COZAAR) tablet 100 mg, 100 mg, Oral, QPM, Smith, Rondell A, MD, 100 mg at 09/06/19 1840 .  morphine (MSIR) tablet 30 mg, 30 mg, Oral, Q6H PRN, Blount, Xenia T, NP, 30 mg at 09/07/19 0848 .  morphine 2 MG/ML injection 2 mg, 2 mg, Intravenous, Q3H PRN, Smith, Rondell A, MD .  pantoprazole (PROTONIX) injection 40 mg, 40 mg, Intravenous, Q12H, Eugenie Filler, MD, 40 mg at 09/07/19 1408 .  polyvinyl alcohol (LIQUIFILM TEARS) 1.4 % ophthalmic solution 1-2 drop, 1-2 drop, Both Eyes, TID PRN, Smith, Rondell A, MD .  predniSONE (DELTASONE) tablet 4 mg, 4 mg, Oral, Q breakfast, Tamala Julian, Rondell A, MD, 4 mg at 09/07/19 0828 .  senna-docusate (Senokot-S) tablet 1 tablet, 1 tablet, Oral, BID, Fuller Plan A, MD, 1 tablet at 09/07/19 0828 .  traMADol (ULTRAM) tablet 50 mg, 50 mg, Oral, TID, Eugenie Filler, MD Allergies  Allergen Reactions  . Alendronate Other (See Comments)    ESOPHAGEAL IRRITATION DANGER OF ESOPHAGEAL PERF.  . Gabapentin Anaphylaxis  . Iodinated Diagnostic Agents Anaphylaxis and Other (See Comments)    Fever and blood clots, also ANAPHYLACTIC REACTION!-kmr-10/16/05  . Iodine-131 Anaphylaxis  . Iohexol Anaphylaxis and Other (See Comments)    ANAPHYLACTIC REACTION!-kmr-10/16/05   . Procaine Anaphylaxis, Rash and Other (See Comments)    Novocaine caused shaking, rash, chest heaviness, made patient feel sick    . Sulfa Antibiotics Swelling and Other (See Comments)    SWELLING REACTION UNSPECIFIED  HALLUCINATIONS    . Terbinafine Hcl Anaphylaxis and Other (See Comments)    THROAT CLOSE  . Terbinafine Hcl Anaphylaxis and Other (See Comments)    THROAT CLOSED  . Amlodipine Swelling and Other (See Comments)    SWELLING REACTION (patient is currently taking with mild fluid retention)  . Amoxicillin-Pot Clavulanate Diarrhea and Other (See Comments)    Bad headaches (tolerates PCN, per husband) Has patient had a PCN reaction causing immediate rash, facial/tongue/throat swelling, SOB or lightheadedness with hypotension: no Has patient had a PCN reaction causing severe rash involving mucus membranes or skin necrosis: no Has patient had a PCN reaction that required hospitalization no Has patient had a PCN reaction occurring within the last 10 years: no If all of the above answers are "NO", then may proceed with Cephalosporin use.  . Clarithromycin Anxiety and Other (See Comments)    Caused the patient to be jittery  . Clindamycin/Lincomycin Other (See Comments)    Reaction not recalled  . Compazine Other (See Comments)    Hyper and shaky  . Dilaudid [Hydromorphone Hcl] Nausea And Vomiting and Other (See Comments)    HEADACHES  . Guaifenesin Nausea And Vomiting, Swelling and Other (See Comments)    SWELLING REACTION UNSPECIFIED  HYPERACTIVITY ALSO  . Guaifenesin Nausea And Vomiting, Swelling, Anxiety and Other (See Comments)    Headaches and hyperactivity (SWELLING REACTION UNSPECIFIED)  . Hydromorphone Nausea And Vomiting and Other (See Comments)    HEADACHE >> MIGRAINE   . Hydromorphone Hcl Nausea And Vomiting and Other (See  Comments)    HEADACHES, also  . Imiquimod Other (See Comments)    "Active and sick "  . Lisinopril Cough    Other reaction(s): Cough (ALLERGY/intolerance), Other  . Methocarbamol Other (See Comments)    "Makes pt feel irritable "   . Metoclopramide Other (See Comments)    "Hyper and shaky"   . Metoclopramide Hcl Other (See Comments)    Hyper, shaky  . Other Hives, Rash  and Other (See Comments)    SOME TAPES CAUSE SKIN IRRITATION  . Prochlorperazine Anxiety and Other (See Comments)    "Hyper and shaky"  . Promethazine Anxiety and Other (See Comments)    DIZZINESS, also  . Sucralfate Nausea Only and Other (See Comments)    Distends stomach and hyperactivity   . Sulfamethoxazole-Trimethoprim Other (See Comments)    ALTERED MENTAL STATUS HALLUCINATIONS    . Trifluoperazine Other (See Comments)    Hallucinations   . Vancomycin Swelling and Other (See Comments)    SWELLING REACTION UNSPECIFIED   . Zolpidem Nausea And Vomiting and Other (See Comments)    Caused the patient to be shaky, too  . Metformin Other (See Comments)    Headaches and migraines      Objective:     BP 121/65 (BP Location: Left Arm)   Pulse 89   Temp 98.6 F (37 C) (Oral)   Resp 18   Ht 5' 2.5" (1.588 m)   Wt 62.9 kg Comment: scale b  SpO2 98%   BMI 24.95 kg/m   No distress  Heart regular rhythm  Lungs clear  Abdomen soft nontender  Laboratory No components found for: D1    Assessment:     Multiple medical problems with this current problem of chest pain and shortness of breath still without a specific diagnosis.  History of esophagitis  History of esophageal stenosis  History of esophagogastric junction outflow obstruction with normal peristalsis.      Plan:     From a GI standpoint at this time I think the next step would be to do an EGD. I'm not sure if were dealing with ulcerative esophagitis, and esophageal stenosis again, or worsening of her esophagogastric junction outflow obstruction. The prior Botox injection back in January has probably worn off. We will plan EGD in the next couple of days more than likely.

## 2019-09-07 NOTE — Progress Notes (Signed)
PROGRESS NOTE    Daisy [name]  RJJ:[id number] DOB: 06-27-1951 DOA: 09/05/2019 PCP: Lawerance Cruel, MD    Brief Narrative:  HPI per Dr. Hyman Bower Daisy [name] is a 68 y.o. female with medical history significant of Addison's disease, renal insufficiency, relapsing MS, on chronic immunosuppressant, hypothyroidism, chronic pain s/p spinal stimulator, breast cancer s/p bilateral mastectomy, GERD, and esophageal dysmotility; who presented with complaints of fever, shortness of breath, and chest pain that woke the patient out of her sleep yesterday morning around 3:30 AM.  She describes it as a sharp and heavy feeling on the left side of her chest.  Complained of it being hard to take a deep breath in.  Reports that she is never had symptoms like this previously in the past.  She initially called her PCP later in the afternoon, and was advised to come to the hospital for further evaluation.  Patient complains of generalized malaise over the last 4 days after her visit with her PCP. While leaving the doctor's office she reported feeling dizzy, nauseous, and everything went black for a few seconds.  Patient thereafter vomited with emesis noted to be clear and thick for which she had a hard time getting it out.  Other associated symptoms include tingling sensation in her arms, but denies any loss of consciousness.   Denies any recent changes in her medication regimen.  Prior to this patient reports having a telemedicine visit back in July due to symptoms of nausea, difficulty swallowing, intermittent chills, and sweats.  She was treated twice with the Diflucan, then they added ondansetron for nausea. Last treated with doxycycline with a sinus infection.  However patient still reports having persistent nausea with intermittent chills, and sweats since possibly back in May.  She was due to have repeat Botox injections with Dr. Therisa Doyne.  Lastly, patient reports significant family history of TB in multiple family  members which she was around growing up.  Back in 1990 it appears patient was treated at least with 3 months of isoniazid side after seeing infectious disease, but did not tolerate the full 33-monthcourse recommended due to headaches.   ED Course: On admission to the emergency department patient was found to be febrile up to to 101.3 F, pulse 98-124, respiration 14-26, and all other vital signs maintained.  Labs revealed WBC 10.2, lactic acid 2.1, CRP 3.9, BNP 31.8, high-sensitivity troponin 6, and d-dimer 0.59.  X-ray was otherwise noted to be clear.  COVID-19 and influenza screening was negative.  Urinalysis positive for ketones and elevated specific gravity.  EKG showing ST elevation significant and concerning for pericarditis.  CT scan of the chest without contrast due to allergies revealed mild fibrosis of the left upper lobe related with posttreatment changes, small hiatal hernia containing pill fragments, kidney stones, compression fracture of L1.  Patient was given full dose aspirin, 4 mg of morphine, and Zofran.  TRH called to admit for concern for pericarditis.    Assessment & Plan:   Principal Problem:   Sepsis ([place] Active Problems:   [place]'s disease [place]   Hypothyroidism   History of left breast cancer   Pericarditis   Chest pain   1 sepsis/SIRS Patient on admission met criteria for sepsis/Sirs with a temperature of 101.3, tachypnea, leukocytosis, slightly elevated lactic acid level of 2.1.  CRP was elevated concerning for underlying infection.  COVID-19 and influenza screening was negative.  CT chest which was done was negative for any acute infiltrate.  Patient pancultured  with blood cultures pending with no growth to date.  Urine cultures with 10,000 colonies of E. coli.  MRSA PCR negative.  Patient started on empiric broad-spectrum IV Rocephin which we will follow for now.  Patient declined to be placed on IV fluids.  Will discontinue Norvasc due to borderline blood  pressure.  Follow.  2.  Chest pain Likely noncardiac chest pain.  Likely GI and musculoskeletal.  High-sensitivity troponins which were done were negative x2.  EKG which was done which was personally reviewed by me and interpreted showed LVH with repolarization with no ischemic changes noted.  2D echo done with a EF of 60 to 65% with left ventricular septal wall thickness mildly increased, mild LVH, diastolic dysfunction, normal right ventricular systolic function, trivial pericardial effusion present.  No wall motion abnormalities.  Patient with chest wall tenderness to palpation.  CT chest which was done was negative for any acute infiltrate.  Patient also with complaints of midsternal to epigastric pain and stated that oral intake of her breakfast this morning with solids as well as liquids exacerbated her chest pain.  Patient states improvement with chest pain since admission.  Patient with history of relapsing MS on chronic immunosuppressants and Addison's disease with esophageal dysmotility and GERD.  Patient states being followed by Dr. Therisa Doyne of [facility] gastroenterology and has received Botox injections x2 before in the past.  Will place on scheduled Ultram, IV PPI twice daily, and consult with GI for further evaluation and management.  Change diet to dysphagia 1 diet.  3.  Adrenal insufficiency. Patient on chronic steroids which have been continued.  Due to concern for sepsis will double home dose of prednisone.    4.  Relapsing MS Stable.  Outpatient follow-up.  5.  Hypothyroidism TSH is 0.547.  Continue home regimen Synthroid.  Outpatient follow-up for repeat thyroid function studies in 4 to 6 weeks.  6.  Gastroesophageal reflux disease/esophageal dysmotility/small hiatal hernia Being followed in the outpatient setting by Dr. Therisa Doyne.  See problem #2.  Change Protonix to Protonix 40 mg IV every 12 hours.  Change diet to dysphagia 1 diet until patient has been assessed by GI.  SLP.  Follow.  7.   Chronic back pain Status post spinal stimulator on chronic morphine.  Continue home pain regimen.  8.  Hypertension Blood pressure borderline.  Discontinue Norvasc Cozaar for now.  9..  History of breast cancer Status post mastectomy.   DVT prophylaxis: Lovenox Code Status: Full Family Communication: Updated patient.  No family at bedside. Disposition Plan: Home when clinically improved and pending GI evaluation.   Consultants:   Gastroenterology pending  Procedures:   2D echo 09/07/2019  CT chest without contrast 09/06/2019  Chest x-ray 09/05/2019  Antimicrobials:  IV Rocephin 09/06/2019   Subjective: Patient laying in bed.  Patient still with complaints of left-sided chest pain however states an improvement since admission.  Patient afebrile since admission.  Patient with complaints of feeling of food getting stuck this morning when she ate a little bit of her breakfast and states complaints with both solids and liquids.  Patient describes chest pain in the left substernal region and midsternal and epigastric region as well.  Patient with complaints of some intermittent abdominal bloating.  Blood pressure noted to be borderline this morning with systolic in the 44B.  Objective: Vitals:   09/07/19 0020 09/07/19 0506 09/07/19 0600 09/07/19 0842  BP: (!) 95/52 (!) 110/59  119/73  Pulse: 92 93  90  Resp:  16  20  Temp:  98.4 F (36.9 C)  98.5 F (36.9 C)  TempSrc:  Oral  Oral  SpO2: 99% 98%  99%  Weight:   62.9 kg   Height:        Intake/Output Summary (Last 24 hours) at 09/07/2019 1208 Last data filed at 09/07/2019 0559 Gross per 24 hour  Intake 240 ml  Output 150 ml  Net 90 ml   Filed Weights   09/05/19 1559 09/06/19 0847 09/07/19 0600  Weight: 60.8 kg 63 kg 62.9 kg    Examination:  General exam: Appears calm and comfortable  Respiratory system: Clear to auscultation. Respiratory effort normal. Cardiovascular system: S1 & S2 heard, RRR. No JVD, murmurs,  rubs, gallops or clicks. No pedal edema.  Chest wall with tenderness to palpation. Gastrointestinal system: Abdomen is soft, nontender, nondistended, positive bowel sounds.  No rebound.  No guarding.  Central nervous system: Alert and oriented. No focal neurological deficits. Extremities: Symmetric 5 x 5 power. Skin: No rashes, lesions or ulcers Psychiatry: Judgement and insight appear normal. Mood & affect appropriate.     Data Reviewed: I have personally reviewed following labs and imaging studies  CBC: Recent Labs  Lab 09/05/19 1711 09/06/19 1132 09/07/19 0729  WBC 10.2 8.4 5.8  NEUTROABS 7.4  --   --   HGB 14.1 13.7 13.0  HCT 44.1 41.5 38.6  MCV 83.7 83.8 83.4  PLT 230 246 169   Basic Metabolic Panel: Recent Labs  Lab 09/05/19 1711 09/06/19 1132 09/07/19 0729  NA 135 136 134*  K 4.7 4.4 3.4*  CL 95* 98 98  CO2 24 21* 22  GLUCOSE 68* 63* 87  BUN 13 19 26*  CREATININE 0.85 1.11* 1.05*  CALCIUM 9.0 8.8* 8.3*   GFR: Estimated Creatinine Clearance: 45.9 mL/min (A) (by C-G formula based on SCr of 1.05 mg/dL (H)). Liver Function Tests: Recent Labs  Lab 09/05/19 1711  AST 31  ALT 12  ALKPHOS 58  BILITOT 0.9  PROT 7.7  ALBUMIN 4.1   Recent Labs  Lab 09/05/19 1711  LIPASE 15   Recent Labs  Lab 09/05/19 1831  AMMONIA 22   Coagulation Profile: No results for input(s): INR, PROTIME in the last 168 hours. Cardiac Enzymes: No results for input(s): CKTOTAL, CKMB, CKMBINDEX, TROPONINI in the last 168 hours. BNP (last 3 results) No results for input(s): PROBNP in the last 8760 hours. HbA1C: No results for input(s): HGBA1C in the last 72 hours. CBG: Recent Labs  Lab 09/06/19 1434  GLUCAP 100*   Lipid Profile: No results for input(s): CHOL, HDL, LDLCALC, TRIG, CHOLHDL, LDLDIRECT in the last 72 hours. Thyroid Function Tests: Recent Labs    09/05/19 1725  TSH 0.547   Anemia Panel: No results for input(s): VITAMINB12, FOLATE, FERRITIN, TIBC, IRON,  RETICCTPCT in the last 72 hours. Sepsis Labs: Recent Labs  Lab 09/05/19 1711 09/06/19 1132 09/06/19 1637  PROCALCITON  --   --  0.39  LATICACIDVEN 2.1* 1.3  --     Recent Results (from the past 240 hour(s))  SARS Coronavirus 2 [facility] order, Performed in [facility] hospital lab) Nasopharyngeal Nasopharyngeal Swab     Status: None   Collection Time: 09/05/19  5:11 PM   Specimen: Nasopharyngeal Swab  Result Value Ref Range Status   SARS Coronavirus 2 NEGATIVE NEGATIVE Final    Comment: (NOTE) If result is NEGATIVE SARS-CoV-2 target nucleic acids are NOT DETECTED. The SARS-CoV-2 RNA is generally detectable in upper and lower  respiratory specimens during the acute phase of infection. The lowest  concentration of SARS-CoV-2 viral copies this assay can detect is 250  copies / mL. A negative result does not preclude SARS-CoV-2 infection  and should not be used as the sole basis for treatment or other  patient management decisions.  A negative result may occur with  improper specimen collection / handling, submission of specimen other  than nasopharyngeal swab, presence of viral mutation(s) within the  areas targeted by this assay, and inadequate number of viral copies  (<250 copies / mL). A negative result must be combined with clinical  observations, patient history, and epidemiological information. If result is POSITIVE SARS-CoV-2 target nucleic acids are DETECTED. The SARS-CoV-2 RNA is generally detectable in upper and lower  respiratory specimens dur ing the acute phase of infection.  Positive  results are indicative of active infection with SARS-CoV-2.  Clinical  correlation with patient history and other diagnostic information is  necessary to determine patient infection status.  Positive results do  not rule out bacterial infection or co-infection with other viruses. If result is PRESUMPTIVE POSTIVE SARS-CoV-2 nucleic acids MAY BE PRESENT.   A presumptive positive result  was obtained on the submitted specimen  and confirmed on repeat testing.  While 2019 novel coronavirus  (SARS-CoV-2) nucleic acids may be present in the submitted sample  additional confirmatory testing may be necessary for epidemiological  and / or clinical management purposes  to differentiate between  SARS-CoV-2 and other Sarbecovirus currently known to infect humans.  If clinically indicated additional testing with an alternate test  methodology [phone]) is advised. The SARS-CoV-2 RNA is generally  detectable in upper and lower respiratory sp ecimens during the acute  phase of infection. The expected result is Negative. Fact Sheet for Patients:  StrictlyIdeas.no Fact Sheet for Healthcare Providers: BankingDealers.co.za This test is not yet approved or cleared by the [place] FDA and has been authorized for detection and/or diagnosis of SARS-CoV-2 by FDA under an Emergency Use Authorization (EUA).  This EUA will remain in effect (meaning this test can be used) for the duration of the COVID-19 declaration under Section 564(b)(1) of the Act, 21 U.S.C. section 360bbb-3(b)(1), unless the authorization is terminated or revoked sooner. Performed at [facility], [address] [place], [place] [address]   Urine culture     Status: Abnormal (Preliminary result)   Collection Time: 09/05/19  8:16 PM   Specimen: Urine, Random  Result Value Ref Range Status   Specimen Description   Final    URINE, RANDOM Performed at [facility], [address] [place], [place] [address]    Special Requests   Final    NONE Performed at [facility], [address] [place], [place] [address]    Culture 10,000 COLONIES/mL GRAM NEGATIVE RODS (A)  Final   Report Status PENDING  Incomplete  Culture, blood (routine x 2)     Status: None (Preliminary result)   Collection Time: 09/06/19  3:35 PM   Specimen: BLOOD LEFT  HAND  Result Value Ref Range Status   Specimen Description BLOOD LEFT HAND  Final   Special Requests   Final    BOTTLES DRAWN AEROBIC ONLY Blood Culture results may not be optimal due to an inadequate volume of blood received in culture bottles   Culture   Final    NO GROWTH < 24 HOURS Performed at [facility], [place] [address],  [place], [place] [address]    Report Status PENDING  Incomplete  Culture, blood (routine x 2)     Status: None (Preliminary result)   Collection Time: 09/06/19  3:41 PM   Specimen: BLOOD RIGHT HAND  Result Value Ref Range Status   Specimen Description BLOOD RIGHT HAND  Final   Special Requests   Final    BOTTLES DRAWN AEROBIC ONLY Blood Culture results may not be optimal due to an inadequate volume of blood received in culture bottles   Culture   Final    NO GROWTH < 24 HOURS Performed at [facility], [place] [address], [place], [place] [address]    Report Status PENDING  Incomplete  MRSA PCR Screening     Status: None   Collection Time: 09/06/19  4:39 PM   Specimen: Nasopharyngeal  Result Value Ref Range Status   MRSA by PCR NEGATIVE NEGATIVE Final    Comment:        The GeneXpert MRSA Assay (FDA approved for NASAL specimens only), is one component of a comprehensive MRSA colonization surveillance program. It is not intended to diagnose MRSA infection nor to guide or monitor treatment for MRSA infections. Performed at [facility], [place] [address], [place], [place] [address]          Radiology Studies: Ct Chest Wo Contrast  Result Date: 09/06/2019 CLINICAL DATA:  Shortness of breath EXAM: CT CHEST WITHOUT CONTRAST TECHNIQUE: Multidetector CT imaging of the chest was performed following the standard protocol without IV contrast. COMPARISON:  Chest x-ray 09/05/2019, CT chest 03/22/2015 FINDINGS: Cardiovascular: Limited evaluation without intravenous contrast. Mild aortic atherosclerosis. No aneurysmal dilatation. Negative for acute  intramural hematoma. Heart size is normal. No significant pericardial effusion Mediastinum/Nodes: Midline trachea. Normal thyroid. No significantly enlarged lymph nodes. Small hiatal hernia containing pill fragments or dense debris distally. Lungs/Pleura: No acute consolidation or effusion. Mild subpleural fibrosis and scarring in the left upper lobe and lingula Upper Abdomen: Partially visualized kidney stones bilaterally. Clips in the gallbladder fossa. Splenic granuloma. Dilated extrahepatic common bile duct up to 14 mm. This is unchanged. Musculoskeletal: Scoliosis of the spine. Chronic compression fracture at L1. Post mastectomy changes on the left IMPRESSION: 1. No focal airspace disease, pleural effusion or pneumothorax. Mild fibrosis in the left upper lobe, likely related to post treatment change 2. Small hiatal hernia containing pill fragments or dense debris within the distal lumen 3. Kidney stones 4. Chronic compression fracture at L1 Aortic Atherosclerosis (ICD10-I70.0). Electronically Signed   By: Donavan Foil M.D.   On: 09/06/2019 01:28   Dg Chest Portable 1 View  Result Date: 09/05/2019 CLINICAL DATA:  Cough, fever EXAM: PORTABLE CHEST 1 VIEW COMPARISON:  Radiograph 01/17/2018, CT 03/22/2015 FINDINGS: Lower cervical spine fusion hardware is seen. Left axillary surgical clips are noted. Postsurgical changes from prior bilateral mastectomy. Spinal nerve stimulator is noted. Cardiac monitoring leads overlie the chest. Bandlike region of scarring in the left mid lung is similar to prior. No consolidation, features of edema, pneumothorax, or effusion. Cardiomediastinal contours are unchanged from comparison studies accounting for differences in technique. The aorta is tortuous and partially calcified. No acute osseous or soft tissue abnormality. IMPRESSION: No acute cardiopulmonary abnormality, specifically no evidence of pneumonia. Electronically Signed   By: Lovena Le M.D.   On: 09/05/2019 18:34         Scheduled Meds: . enoxaparin (LOVENOX) injection  40 mg Subcutaneous Q24H  . levothyroxine  50 mcg Oral QODAY  .  levothyroxine  75 mcg Oral QODAY  . losartan  100 mg Oral QPM  . pantoprazole  40 mg Oral BID AC  . potassium chloride  40 mEq Oral Once  . predniSONE  4 mg Oral Q breakfast  . senna-docusate  1 tablet Oral BID   Continuous Infusions: . cefTRIAXone (ROCEPHIN)  IV 2 g (09/06/19 1525)     LOS: 1 day    Time spent: 40 minutes    Irine Seal, MD [facility]  If 7PM-7AM, please contact night-coverage [email or web address] 09/07/2019, 12:08 PM

## 2019-09-08 LAB — CBC WITH DIFFERENTIAL/PLATELET
Abs Immature Granulocytes: 0.01 10*3/uL (ref 0.00–0.07)
Basophils Absolute: 0 10*3/uL (ref 0.0–0.1)
Basophils Relative: 1 %
Eosinophils Absolute: 0.1 10*3/uL (ref 0.0–0.5)
Eosinophils Relative: 2 %
HCT: 35.8 % — ABNORMAL LOW (ref 36.0–46.0)
Hemoglobin: 12 g/dL (ref 12.0–15.0)
Immature Granulocytes: 0 %
Lymphocytes Relative: 30 %
Lymphs Abs: 1.7 10*3/uL (ref 0.7–4.0)
MCH: 28 pg (ref 26.0–34.0)
MCHC: 33.5 g/dL (ref 30.0–36.0)
MCV: 83.4 fL (ref 80.0–100.0)
Monocytes Absolute: 0.6 10*3/uL (ref 0.1–1.0)
Monocytes Relative: 10 %
Neutro Abs: 3.2 10*3/uL (ref 1.7–7.7)
Neutrophils Relative %: 57 %
Platelets: 248 10*3/uL (ref 150–400)
RBC: 4.29 MIL/uL (ref 3.87–5.11)
RDW: 20.2 % — ABNORMAL HIGH (ref 11.5–15.5)
WBC: 5.6 10*3/uL (ref 4.0–10.5)
nRBC: 0 % (ref 0.0–0.2)

## 2019-09-08 LAB — BASIC METABOLIC PANEL
Anion gap: 10 (ref 5–15)
BUN: 18 mg/dL (ref 8–23)
CO2: 22 mmol/L (ref 22–32)
Calcium: 8.1 mg/dL — ABNORMAL LOW (ref 8.9–10.3)
Chloride: 105 mmol/L (ref 98–111)
Creatinine, Ser: 0.69 mg/dL (ref 0.44–1.00)
GFR calc Af Amer: >60 mL/min (ref >60)
GFR calc non Af Amer: >60 mL/min (ref >60)
Glucose, Bld: 79 mg/dL (ref 70–99)
Potassium: 4.2 mmol/L (ref 3.5–5.1)
Sodium: 137 mmol/L (ref 135–145)

## 2019-09-08 LAB — URINE CULTURE: Culture: 10000 — AB

## 2019-09-08 LAB — MAGNESIUM: Magnesium: 1.9 mg/dL (ref 1.7–2.4)

## 2019-09-08 MED ORDER — DICLOFENAC SODIUM 1 % TD GEL
2.0000 g | Freq: Four times a day (QID) | TRANSDERMAL | Status: DC
  Administered 2019-09-08 – 2019-09-10 (×4): 2 g via TOPICAL
  Filled 2019-09-08: qty 100

## 2019-09-08 MED ORDER — SODIUM CHLORIDE 0.9 % IV SOLN
INTRAVENOUS | Status: DC
  Administered 2019-09-08: 22:00:00 via INTRAVENOUS

## 2019-09-08 MED ORDER — PREDNISONE 1 MG PO TABS
4.0000 mg | ORAL_TABLET | Freq: Every day | ORAL | Status: DC
  Administered 2019-09-09 – 2019-09-10 (×2): 4 mg via ORAL
  Filled 2019-09-08 (×2): qty 4

## 2019-09-08 MED ORDER — SODIUM CHLORIDE 0.9% FLUSH
10.0000 mL | Freq: Two times a day (BID) | INTRAVENOUS | Status: DC
  Administered 2019-09-08 – 2019-09-10 (×3): 10 mL

## 2019-09-08 MED ORDER — SODIUM CHLORIDE 0.9% FLUSH
10.0000 mL | INTRAVENOUS | Status: DC | PRN
  Administered 2019-09-09: 10 mL
  Filled 2019-09-08: qty 40

## 2019-09-08 NOTE — Progress Notes (Signed)
Responded to PIV consult. HCP in room and requested return later.

## 2019-09-08 NOTE — Plan of Care (Signed)

## 2019-09-08 NOTE — Progress Notes (Signed)
PROGRESS NOTE    Daisy [name]  PYP:[id number] DOB: Jun 06, 1951 DOA: 09/05/2019 PCP: Lawerance Cruel, MD    Brief Narrative:  HPI per Dr. Hyman Bower Daisy [name] is a 68 y.o. female with medical history significant of Addison's disease, renal insufficiency, relapsing MS, on chronic immunosuppressant, hypothyroidism, chronic pain s/p spinal stimulator, breast cancer s/p bilateral mastectomy, GERD, and esophageal dysmotility; who presented with complaints of fever, shortness of breath, and chest pain that woke the patient out of her sleep yesterday morning around 3:30 AM.  She describes it as a sharp and heavy feeling on the left side of her chest.  Complained of it being hard to take a deep breath in.  Reports that she is never had symptoms like this previously in the past.  She initially called her PCP later in the afternoon, and was advised to come to the hospital for further evaluation.  Patient complains of generalized malaise over the last 4 days after her visit with her PCP. While leaving the doctor's office she reported feeling dizzy, nauseous, and everything went black for a few seconds.  Patient thereafter vomited with emesis noted to be clear and thick for which she had a hard time getting it out.  Other associated symptoms include tingling sensation in her arms, but denies any loss of consciousness.   Denies any recent changes in her medication regimen.  Prior to this patient reports having a telemedicine visit back in July due to symptoms of nausea, difficulty swallowing, intermittent chills, and sweats.  She was treated twice with the Diflucan, then they added ondansetron for nausea. Last treated with doxycycline with a sinus infection.  However patient still reports having persistent nausea with intermittent chills, and sweats since possibly back in May.  She was due to have repeat Botox injections with Dr. Therisa Doyne.  Lastly, patient reports significant family history of TB in multiple family  members which she was around growing up.  Back in 1990 it appears patient was treated at least with 3 months of isoniazid side after seeing infectious disease, but did not tolerate the full 74-monthcourse recommended due to headaches.   ED Course: On admission to the emergency department patient was found to be febrile up to to 101.3 F, pulse 98-124, respiration 14-26, and all other vital signs maintained.  Labs revealed WBC 10.2, lactic acid 2.1, CRP 3.9, BNP 31.8, high-sensitivity troponin 6, and d-dimer 0.59.  X-ray was otherwise noted to be clear.  COVID-19 and influenza screening was negative.  Urinalysis positive for ketones and elevated specific gravity.  EKG showing ST elevation significant and concerning for pericarditis.  CT scan of the chest without contrast due to allergies revealed mild fibrosis of the left upper lobe related with posttreatment changes, small hiatal hernia containing pill fragments, kidney stones, compression fracture of L1.  Patient was given full dose aspirin, 4 mg of morphine, and Zofran.  TRH called to admit for concern for pericarditis.    Assessment & Plan:   Principal Problem:   Sepsis ([place] Active Problems:   Addison's disease ([place]   Hypothyroidism   History of left breast cancer   Pericarditis   Chest pain   Esophageal dysmotility   1 sepsis/SIRS Patient on admission met criteria for sepsis/Sirs with a temperature of 101.3, tachypnea, leukocytosis, slightly elevated lactic acid level of 2.1.  CRP was elevated concerning for underlying infection.  COVID-19 and influenza screening was negative.  CT chest which was done was negative for any  acute infiltrate.  Patient pancultured with blood cultures pending with no growth to date.  Urine cultures with 10,000 colonies of E. coli.  MRSA PCR negative.  Patient started on empiric broad-spectrum IV Rocephin which we will follow for now.  Patient declined to be placed on IV fluids however now in agreement and has  been placed back on IV fluids.  Antihypertensive medications held.  Will likely treat empirically with IV antibiotics for 5 days.  Follow.  2.  Chest pain Likely noncardiac chest pain.  Likely GI and musculoskeletal.  High-sensitivity troponins which were done were negative x2.  EKG which was done which was personally reviewed by me and interpreted showed LVH with repolarization with no ischemic changes noted.  2D echo done with a EF of 60 to 65% with left ventricular septal wall thickness mildly increased, mild LVH, diastolic dysfunction, normal right ventricular systolic function, trivial pericardial effusion present.  No wall motion abnormalities.  Patient with chest wall tenderness to palpation.  CT chest which was done was negative for any acute infiltrate.  Patient also with complaints of midsternal to epigastric pain and stated that oral intake of her breakfast this morning with solids as well as liquids exacerbated her chest pain.  Patient states improvement with chest pain since admission.  Patient with history of relapsing MS on chronic immunosuppressants and Addison's disease with esophageal dysmotility and GERD.  Patient states being followed by Dr. Therisa Doyne of [facility] gastroenterology and has received Botox injections x2 before in the past.  Patient refusing Ultram and as such we will discontinue Ultram.  Placed on Voltaren gel.  Continue PPI IV twice daily.  Change diet to a dysphagia 3 diet.  Patient seen in consultation by GI and patient for upper endoscopy tomorrow for further evaluation and management.   3.  Adrenal insufficiency. Patient on chronic steroids which was resumed. Due to concern for sepsis home dose prednisone was doubled however patient noted to have refuses this morning that she states when she gets sick she only takes 6 mg.  Blood pressure currently stable.  Will place back on home regimen of prednisone 4 mg daily.   4.  Relapsing MS Stable.  Outpatient follow-up.  5.   Hypothyroidism TSH is 0.547.  Continue Synthroid.  Outpatient follow-up.   6.  Gastroesophageal reflux disease/esophageal dysmotility/small hiatal hernia Being followed in the outpatient setting by Dr. Therisa Doyne.  See problem #2.  Change Protonix to Protonix 40 mg IV every 12 hours.  Change diet to dysphagia 3 diet.  GI has assessed patient and patient for probable upper endoscopy tomorrow.  Per GI.  7.  Chronic back pain Status post spinal stimulator on chronic morphine.  Continue home pain regimen.  8.  Hypertension Blood pressure borderline as such Lovenox and Cozaar were discontinued.  Continue IV fluids.  Follow.    9..  History of breast cancer Status post mastectomy.   DVT prophylaxis: Lovenox Code Status: Full Family Communication: Updated patient.  No family at bedside. Disposition Plan: Home when clinically improved and pending GI evaluation.   Consultants:   Gastroenterology: Dr. Penelope Coop 09/07/2019  Procedures:   2D echo 09/07/2019  CT chest without contrast 09/06/2019  Chest x-ray 09/05/2019  Antimicrobials:  IV Rocephin 09/06/2019   Subjective: Patient in bed.  Patient states still with some midepigastric pain and some chest pain however some improvement since admission.  Patient noted to have refused double her home dose of prednisone as she states she only takes about 6  at home when she is sick.  Patient refusing Ultram due to concerns that she may not get her chronic pain medication.   Objective: Vitals:   09/07/19 1943 09/08/19 0052 09/08/19 0431 09/08/19 0723  BP: 122/90 130/75 137/80 (!) 148/71  Pulse: (!) 108 92 81 82  Resp: _0 Temp: 98.6 F (37 C) 98.6 F (37 C) 98.2 F (36.8 C) (!) 97.3 F (36.3 C)  TempSrc: Oral Oral Oral Oral  SpO2: 97% 95% 100% 97%  Weight:   63.2 kg   Height:        Intake/Output Summary (Last 24 hours) at 09/08/2019 1202 Last data filed at 09/08/2019 0905 Gross per 24 hour  Intake 1605.79 ml  Output 550 ml    Net 1055.79 ml   Filed Weights   09/06/19 0847 09/07/19 0600 09/08/19 0431  Weight: 63 kg 62.9 kg 63.2 kg    Examination:  General exam: NAD. Respiratory system: CTAB.  No wheezes, no crackles, no rhonchi.  Normal respiratory effort. Cardiovascular system: Regular rate rhythm no murmurs rubs or gallops.  No JVD.  No lower extremity edema.  Chest wall with less tenderness to palpation.  Gastrointestinal system: Abdomen is soft, nontender, nondistended, positive bowel sounds.  No rebound.  No guarding. Central nervous system: Alert and oriented. No focal neurological deficits. Extremities: Symmetric 5 x 5 power. Skin: No rashes, lesions or ulcers Psychiatry: Judgement and insight appear normal. Mood & affect appropriate.     Data Reviewed: I have personally reviewed following labs and imaging studies  CBC: Recent Labs  Lab 09/05/19 1711 09/06/19 1132 09/07/19 0729 09/08/19 0642  WBC 10.2 8.4 5.8 5.6  NEUTROABS 7.4  --   --  3.2  HGB 14.1 13.7 13.0 12.0  HCT 44.1 41.5 38.6 35.8*  MCV 83.7 83.8 83.4 83.4  PLT 230 246 248 403   Basic Metabolic Panel: Recent Labs  Lab 09/05/19 1711 09/06/19 1132 09/07/19 0729 09/08/19 0642  NA 135 136 134* 137  K 4.7 4.4 3.4* 4.2  CL 95* 98 98 105  CO2 24 21* 22 22  GLUCOSE 68* 63* 87 79  BUN 13 19 26* 18  CREATININE 0.85 1.11* 1.05* 0.69  CALCIUM 9.0 8.8* 8.3* 8.1*  MG  --   --   --  1.9   GFR: Estimated Creatinine Clearance: 60.4 mL/min (by C-G formula based on SCr of 0.69 mg/dL). Liver Function Tests: Recent Labs  Lab 09/05/19 1711  AST 31  ALT 12  ALKPHOS 58  BILITOT 0.9  PROT 7.7  ALBUMIN 4.1   Recent Labs  Lab 09/05/19 1711  LIPASE 15   Recent Labs  Lab 09/05/19 1831  AMMONIA 22   Coagulation Profile: No results for input(s): INR, PROTIME in the last 168 hours. Cardiac Enzymes: No results for input(s): CKTOTAL, CKMB, CKMBINDEX, TROPONINI in the last 168 hours. BNP (last 3 results) No results for  input(s): PROBNP in the last 8760 hours. HbA1C: No results for input(s): HGBA1C in the last 72 hours. CBG: Recent Labs  Lab 09/06/19 1434  GLUCAP 100*   Lipid Profile: No results for input(s): CHOL, HDL, LDLCALC, TRIG, CHOLHDL, LDLDIRECT in the last 72 hours. Thyroid Function Tests: Recent Labs    09/05/19 1725  TSH 0.547   Anemia Panel: No results for input(s): VITAMINB12, FOLATE, FERRITIN, TIBC, IRON, RETICCTPCT in the last 72 hours. Sepsis Labs: Recent Labs  Lab 09/05/19 1711 09/06/19 1132 09/06/19 1637  PROCALCITON  --   --  0.39  LATICACIDVEN 2.1* 1.3  --     Recent Results (from the past 240 hour(s))  SARS Coronavirus 2 [facility] order, Performed in [facility] hospital lab) Nasopharyngeal Nasopharyngeal Swab     Status: None   Collection Time: 09/05/19  5:11 PM   Specimen: Nasopharyngeal Swab  Result Value Ref Range Status   SARS Coronavirus 2 NEGATIVE NEGATIVE Final    Comment: (NOTE) If result is NEGATIVE SARS-CoV-2 target nucleic acids are NOT DETECTED. The SARS-CoV-2 RNA is generally detectable in upper and lower  respiratory specimens during the acute phase of infection. The lowest  concentration of SARS-CoV-2 viral copies this assay can detect is 250  copies / mL. A negative result does not preclude SARS-CoV-2 infection  and should not be used as the sole basis for treatment or other  patient management decisions.  A negative result may occur with  improper specimen collection / handling, submission of specimen other  than nasopharyngeal swab, presence of viral mutation(s) within the  areas targeted by this assay, and inadequate number of viral copies  (<250 copies / mL). A negative result must be combined with clinical  observations, patient history, and epidemiological information. If result is POSITIVE SARS-CoV-2 target nucleic acids are DETECTED. The SARS-CoV-2 RNA is generally detectable in upper and lower  respiratory specimens dur ing the acute  phase of infection.  Positive  results are indicative of active infection with SARS-CoV-2.  Clinical  correlation with patient history and other diagnostic information is  necessary to determine patient infection status.  Positive results do  not rule out bacterial infection or co-infection with other viruses. If result is PRESUMPTIVE POSTIVE SARS-CoV-2 nucleic acids MAY BE PRESENT.   A presumptive positive result was obtained on the submitted specimen  and confirmed on repeat testing.  While 2019 novel coronavirus  (SARS-CoV-2) nucleic acids may be present in the submitted sample  additional confirmatory testing may be necessary for epidemiological  and / or clinical management purposes  to differentiate between  SARS-CoV-2 and other Sarbecovirus currently known to infect humans.  If clinically indicated additional testing with an alternate test  methodology [phone]) is advised. The SARS-CoV-2 RNA is generally  detectable in upper and lower respiratory sp ecimens during the acute  phase of infection. The expected result is Negative. Fact Sheet for Patients:  StrictlyIdeas.no Fact Sheet for Healthcare Providers: BankingDealers.co.za This test is not yet approved or cleared by the [place] FDA and has been authorized for detection and/or diagnosis of SARS-CoV-2 by FDA under an Emergency Use Authorization (EUA).  This EUA will remain in effect (meaning this test can be used) for the duration of the COVID-19 declaration under Section 564(b)(1) of the Act, 21 U.S.C. section 360bbb-3(b)(1), unless the authorization is terminated or revoked sooner. Performed at [facility], [address], [place], [place] [address]   Urine culture     Status: Abnormal   Collection Time: 09/05/19  8:16 PM   Specimen: Urine, Random  Result Value Ref Range Status   Specimen Description   Final    URINE, RANDOM Performed at [facility], [address] [place], [place] [address]    Special Requests   Final    NONE Performed at [facility], [address] [place], [place] [address]    Culture 10,000 COLONIES/mL ESCHERICHIA COLI (A)  Final   Report Status 09/08/2019 FINAL  Final   Organism ID, Bacteria ESCHERICHIA COLI (A)  Final      Susceptibility   Escherichia coli - MIC*    AMPICILLIN >=32 RESISTANT Resistant     CEFAZOLIN <=4 SENSITIVE Sensitive     CEFTRIAXONE <=1 SENSITIVE Sensitive     CIPROFLOXACIN <=0.25 SENSITIVE Sensitive     GENTAMICIN <=1 SENSITIVE Sensitive     IMIPENEM <=0.25 SENSITIVE Sensitive     NITROFURANTOIN 32 SENSITIVE Sensitive     TRIMETH/SULFA <=20 SENSITIVE Sensitive     AMPICILLIN/SULBACTAM >=32 RESISTANT Resistant     PIP/TAZO <=4 SENSITIVE Sensitive     Extended ESBL NEGATIVE Sensitive     * 10,000 COLONIES/mL ESCHERICHIA COLI  Culture, blood (routine x 2)     Status: None (Preliminary result)   Collection Time: 09/06/19  3:35 PM   Specimen: BLOOD LEFT HAND  Result Value Ref Range Status   Specimen Description BLOOD LEFT HAND  Final   Special Requests   Final    BOTTLES DRAWN AEROBIC ONLY Blood Culture results may not be optimal due to an inadequate volume of blood received in culture bottles   Culture   Final    NO GROWTH 2 DAYS Performed at [facility], [address], [place], [place] [address]    Report Status PENDING  Incomplete  Culture, blood (routine x 2)     Status: None (Preliminary result)   Collection Time: 09/06/19  3:41 PM   Specimen: BLOOD RIGHT HAND  Result Value Ref Range Status   Specimen Description BLOOD RIGHT HAND  Final   Special Requests   Final    BOTTLES DRAWN AEROBIC ONLY Blood Culture results may not be optimal due to an inadequate volume of blood received in culture bottles   Culture   Final    NO GROWTH 2 DAYS Performed at [facility], [place] [address], [place], [place] [address]    Report Status PENDING   Incomplete  MRSA PCR Screening     Status: None   Collection Time: 09/06/19  4:39 PM   Specimen: Nasopharyngeal  Result Value Ref Range Status   MRSA by PCR NEGATIVE NEGATIVE Final    Comment:        The GeneXpert MRSA Assay (FDA approved for NASAL specimens only), is one component of a comprehensive MRSA colonization surveillance program. It is not intended to diagnose MRSA infection nor to guide or monitor treatment for MRSA infections. Performed at [facility], [place] [address], [place], [place] [address]          Radiology Studies: No results found.      Scheduled Meds:  diclofenac sodium  2 g Topical QID   enoxaparin (LOVENOX) injection  40 mg Subcutaneous Q24H   levothyroxine  50 mcg Oral QODAY   levothyroxine  75 mcg Oral QODAY   pantoprazole (PROTONIX) IV  40 mg Intravenous Q12H   [START ON 09/09/2019] predniSONE  4 mg Oral Q breakfast   senna-docusate  1 tablet Oral BID   sodium chloride flush  10-40 mL Intracatheter Q12H   Continuous Infusions:  sodium chloride 100 mL/hr at 09/07/19 1900   cefTRIAXone (ROCEPHIN)  IV Stopped (09/07/19 1457)     LOS: 2 days    Time spent: 40 minutes    Irine Seal, MD [facility]  If 7PM-7AM, please contact night-coverage [email or web address] 09/08/2019, 12:02 PM

## 2019-09-08 NOTE — Progress Notes (Signed)
[name] 10:56 AM  Subjective: Patient seen and examined in her hospital computer chart reviewed and her case discussed with my partners Dr. Penelope Coop and Dr. Therisa Doyne and I had a long talk about the patient reviewing her history and multiple complaints and she has been having some more recent vomiting or spitting up and her chest pain may have been due to that and she does have chronic swallowing issues possibly helped by Botox in the past and she also has some increased periodic bloating which we discussed particularly how that can be worked up as an outpatient and we answered all of her questions previous endoscopy reviewed  Objective: Vital signs stable afebrile no acute distress lungs are clear regular rate and rhythm abdomen is soft nontender occasional bowel sounds labs okay chest CT okay assessment: Multiple medical problems including dysphasia  Plan: We discussed endoscopy with or without dilation or Botox or possible just biopsy and will proceed tomorrow morning with further work-up and plans pending those findings and she probably needs an x-ray with her next bout of bloating and she is unsure of whether she is due for an outpatient colonoscopy or not which I will leave to Dr. Therisa Doyne  [facility]  office [phone] After 5PM or if no answer call [phone]

## 2019-09-09 ENCOUNTER — Encounter (HOSPITAL_COMMUNITY)
Admission: EM | Disposition: A | Discharge status: Home / Self Care | Payer: Self-pay | Source: Home / Self Care | Attending: Internal Medicine

## 2019-09-09 ENCOUNTER — Encounter (HOSPITAL_COMMUNITY): Payer: Self-pay | Admitting: *Deleted

## 2019-09-09 ENCOUNTER — Inpatient Hospital Stay (HOSPITAL_COMMUNITY): Payer: Medicare Other | Admitting: Certified Registered"

## 2019-09-09 HISTORY — PX: BOTOX INJECTION: SHX5754

## 2019-09-09 HISTORY — PX: ESOPHAGOGASTRODUODENOSCOPY (EGD) WITH PROPOFOL: SHX5813

## 2019-09-09 LAB — BASIC METABOLIC PANEL
Anion gap: 8 (ref 5–15)
BUN: 8 mg/dL (ref 8–23)
CO2: 24 mmol/L (ref 22–32)
Calcium: 7.9 mg/dL — ABNORMAL LOW (ref 8.9–10.3)
Chloride: 106 mmol/L (ref 98–111)
Creatinine, Ser: 0.69 mg/dL (ref 0.44–1.00)
GFR calc Af Amer: >60 mL/min (ref >60)
GFR calc non Af Amer: >60 mL/min (ref >60)
Glucose, Bld: 75 mg/dL (ref 70–99)
Potassium: 3.5 mmol/L (ref 3.5–5.1)
Sodium: 138 mmol/L (ref 135–145)

## 2019-09-09 LAB — CBC
HCT: 34.1 % — ABNORMAL LOW (ref 36.0–46.0)
Hemoglobin: 10.9 g/dL — ABNORMAL LOW (ref 12.0–15.0)
MCH: 27.5 pg (ref 26.0–34.0)
MCHC: 32 g/dL (ref 30.0–36.0)
MCV: 86.1 fL (ref 80.0–100.0)
Platelets: 234 10*3/uL (ref 150–400)
RBC: 3.96 MIL/uL (ref 3.87–5.11)
RDW: 19.9 % — ABNORMAL HIGH (ref 11.5–15.5)
WBC: 5 10*3/uL (ref 4.0–10.5)
nRBC: 0 % (ref 0.0–0.2)

## 2019-09-09 SURGERY — ESOPHAGOGASTRODUODENOSCOPY (EGD) WITH PROPOFOL
Anesthesia: Monitor Anesthesia Care

## 2019-09-09 MED ORDER — LOSARTAN POTASSIUM 50 MG PO TABS: 100.0000 mg | ORAL_TABLET | Freq: Every evening | ORAL | Status: DC

## 2019-09-09 MED ORDER — ONDANSETRON 4 MG PO TBDP
8.0000 mg | ORAL_TABLET | Freq: Three times a day (TID) | ORAL | Status: DC | PRN
  Administered 2019-09-09 – 2019-09-10 (×4): 8 mg via ORAL
  Filled 2019-09-09 (×4): qty 2

## 2019-09-09 MED ORDER — PROPOFOL 10 MG/ML IV BOLUS
INTRAVENOUS | Status: DC | PRN
  Administered 2019-09-09 (×3): 20 mg via INTRAVENOUS

## 2019-09-09 MED ORDER — LACTATED RINGERS IV SOLN: INTRAVENOUS | Status: DC | PRN

## 2019-09-09 MED ORDER — LOSARTAN POTASSIUM 50 MG PO TABS
100.0000 mg | ORAL_TABLET | Freq: Every day | ORAL | Status: DC
  Administered 2019-09-09 – 2019-09-10 (×2): 100 mg via ORAL
  Filled 2019-09-09 (×2): qty 2

## 2019-09-09 MED ORDER — PANTOPRAZOLE SODIUM 40 MG PO TBEC
40.0000 mg | DELAYED_RELEASE_TABLET | Freq: Two times a day (BID) | ORAL | Status: DC
  Administered 2019-09-09 – 2019-09-10 (×2): 40 mg via ORAL
  Filled 2019-09-09: qty 1

## 2019-09-09 MED ORDER — PROPOFOL 500 MG/50ML IV EMUL
INTRAVENOUS | Status: DC | PRN
  Administered 2019-09-09: 100 ug/kg/min via INTRAVENOUS

## 2019-09-09 MED ORDER — SODIUM CHLORIDE (PF) 0.9 % IJ SOLN
100.0000 [IU] | Freq: Once | INTRAMUSCULAR | Status: AC
  Administered 2019-09-09: 100 [IU] via SUBMUCOSAL
  Filled 2019-09-09: qty 100

## 2019-09-09 SURGICAL SUPPLY — 15 items

## 2019-09-09 NOTE — Op Note (Signed)
[facility] Patient Name: Daisy [name] Procedure Date : 09/09/2019 MRN: [id number] Attending MD: Clarene Essex , MD Date of Birth: Sep 18, 1951 CSN: [id number] Age: 68 Admit Type: Inpatient Procedure:                Upper GI endoscopy Indications:              Dysphagia helped by Botox in the past inpatient                            with hypertensive LES on manometry Providers:                Clarene Essex, MD, Grace Isaac, RN, Cherylynn Ridges,                            Technician, Claybon Jabs CRNA, CRNA Referring MD:              Medicines:                Propofol per Anesthesia Complications:            No immediate complications. Estimated Blood Loss:     Estimated blood loss: none. Estimated blood loss:                            none. Procedure:                Pre-Anesthesia Assessment:                           - Prior to the procedure, a History and Physical                            was performed, and patient medications and                            allergies were reviewed. The patient's tolerance of                            previous anesthesia was also reviewed. The risks                            and benefits of the procedure and the sedation                            options and risks were discussed with the patient.                            All questions were answered, and informed consent                            was obtained. Prior Anticoagulants: The patient has                            taken no previous anticoagulant or antiplatelet  agents. ASA Grade Assessment: II - A patient with                            mild systemic disease. After reviewing the risks                            and benefits, the patient was deemed in                            satisfactory condition to undergo the procedure.                           After obtaining informed consent, the endoscope was                            passed under direct  vision. Throughout the                            procedure, the patient's blood pressure, pulse, and                            oxygen saturations were monitored continuously. The                            [id number]) Olympus gastroscope was                            introduced through the mouth, and advanced to the                            third part of duodenum. The upper GI endoscopy was                            accomplished without difficulty. The patient                            tolerated the procedure well. Scope In: Scope Out: Findings:      A small hiatal hernia was present.      One benign-appearing, intrinsic mild stenosis was found. The stenosis       was traversed.      A hypertonic lower esophageal sphincter was found. There was no       resistance to endoscope advancement into the stomach. The Z-line was       regular. The gastroesophageal junction and cardia were normal on       retroflexed view. Area was successfully injected with 100 units       botulinum toxin.      The duodenal bulb, first portion of the duodenum, second portion of the       duodenum and third portion of the duodenum were normal.      The exam was otherwise without abnormality.      Localized mildly erythematous mucosa without bleeding was found in the       prepyloric region of the stomach.      A few diminutive semi-sessile polyps were found on the  greater curvature       of the stomach. Impression:               - Small hiatal hernia.                           - Benign-appearing esophageal stenosis.                           - Esophageal motility disorder.                           - Normal duodenal bulb, first portion of the                            duodenum, second portion of the duodenum and third                            portion of the duodenum.                           - The examination was otherwise normal.                           - Erythematous mucosa in the  prepyloric region of                            the stomach.                           - A few gastric polyps.                           - No specimens collected. Recommendation:           - Soft diet today.                           - Continue present medications.                           - Return to GI clinic in 4 weeks. Dr. Therisa Doyne her                            primary gastroenterologist happy to see back                           - Telephone GI clinic if symptomatic PRN. Hopefully                            patient can go home soon although with multiple                            somatic complaints might be difficult to address                            all as an inpatient Procedure Code(s):        ---  Professional ---                           [phone], Esophagogastroduodenoscopy, flexible,                            transoral; with directed submucosal injection(s),                            any substance Diagnosis Code(s):        --- Professional ---                           K44.9, Diaphragmatic hernia without obstruction or                            gangrene                           K22.2, Esophageal obstruction                           K22.4, Dyskinesia of esophagus                           R13.10, Dysphagia, unspecified CPT copyright 2019 [employer]. All rights reserved. The codes documented in this report are preliminary and upon coder review may  be revised to meet current compliance requirements. Clarene Essex, MD 09/09/2019 9:16:46 AM This report has been signed electronically. Number of Addenda: 0

## 2019-09-09 NOTE — Progress Notes (Signed)
[name] 8:43 AM  Subjective: Patient with some chest heaviness but no other new complaints  Objective: Signs stable afebrile exam please see preassessment evaluation labs stable  Assessment: Multiple medical complaints including dysphasia and atypical chest pain  Plan: Okay to proceed with endoscopy with anesthesia assistance with possible dilation or Botox pending those findings  [facility]  office [phone] After 5PM or if no answer call [phone]

## 2019-09-09 NOTE — Transfer of Care (Signed)
Immediate Anesthesia Transfer of Care Note  Patient: NOREENE BOREMAN  Procedure(s) Performed: ESOPHAGOGASTRODUODENOSCOPY (EGD) WITH PROPOFOL (N/A ) BOTOX INJECTION  Patient Location: Endoscopy Unit  Anesthesia Type:MAC  Level of Consciousness: drowsy and patient cooperative  Airway & Oxygen Therapy: Patient Spontanous Breathing and Patient connected to nasal cannula oxygen  Post-op Assessment: Report given to RN, Post -op Vital signs reviewed and stable and Patient moving all extremities  Post vital signs: Reviewed and stable  Last Vitals:  Vitals Value Taken Time  BP    Temp    Pulse 88 09/09/19 0913  Resp 18 09/09/19 0913  SpO2 100 % 09/09/19 0913  Vitals shown include unvalidated device data.  Last Pain:  Vitals:   09/09/19 0812  TempSrc: Tympanic  PainSc: 9       Patients Stated Pain Goal: 2 (60/73/71 0626)  Complications: No apparent anesthesia complications

## 2019-09-09 NOTE — Progress Notes (Signed)
PROGRESS NOTE    Daisy [name]  PYP:[id number] DOB: 03-04-1951 DOA: 09/05/2019 PCP: Lawerance Cruel, MD    Brief Narrative:  HPI per Dr. Hyman Bower Daisy [name] is a 68 y.o. female with medical history significant of Addison's disease, renal insufficiency, relapsing MS, on chronic immunosuppressant, hypothyroidism, chronic pain s/p spinal stimulator, breast cancer s/p bilateral mastectomy, GERD, and esophageal dysmotility; who presented with complaints of fever, shortness of breath, and chest pain that woke the patient out of her sleep yesterday morning around 3:30 AM.  She describes it as a sharp and heavy feeling on the left side of her chest.  Complained of it being hard to take a deep breath in.  Reports that she is never had symptoms like this previously in the past.  She initially called her PCP later in the afternoon, and was advised to come to the hospital for further evaluation.  Patient complains of generalized malaise over the last 4 days after her visit with her PCP. While leaving the doctor's office she reported feeling dizzy, nauseous, and everything went black for a few seconds.  Patient thereafter vomited with emesis noted to be clear and thick for which she had a hard time getting it out.  Other associated symptoms include tingling sensation in her arms, but denies any loss of consciousness.   Denies any recent changes in her medication regimen.  Prior to this patient reports having a telemedicine visit back in July due to symptoms of nausea, difficulty swallowing, intermittent chills, and sweats.  She was treated twice with the Diflucan, then they added ondansetron for nausea. Last treated with doxycycline with a sinus infection.  However patient still reports having persistent nausea with intermittent chills, and sweats since possibly back in May.  She was due to have repeat Botox injections with Dr. Therisa Doyne.  Lastly, patient reports significant family history of TB in multiple family  members which she was around growing up.  Back in 1990 it appears patient was treated at least with 3 months of isoniazid side after seeing infectious disease, but did not tolerate the full 46-monthcourse recommended due to headaches.   ED Course: On admission to the emergency department patient was found to be febrile up to to 101.3 F, pulse 98-124, respiration 14-26, and all other vital signs maintained.  Labs revealed WBC 10.2, lactic acid 2.1, CRP 3.9, BNP 31.8, high-sensitivity troponin 6, and d-dimer 0.59.  X-ray was otherwise noted to be clear.  COVID-19 and influenza screening was negative.  Urinalysis positive for ketones and elevated specific gravity.  EKG showing ST elevation significant and concerning for pericarditis.  CT scan of the chest without contrast due to allergies revealed mild fibrosis of the left upper lobe related with posttreatment changes, small hiatal hernia containing pill fragments, kidney stones, compression fracture of L1.  Patient was given full dose aspirin, 4 mg of morphine, and Zofran.  TRH called to admit for concern for pericarditis.    Assessment & Plan:   Principal Problem:   Sepsis ([place] Active Problems:   Addison's disease ([place]   Hypothyroidism   History of left breast cancer   Pericarditis   Chest pain   Esophageal dysmotility   1 sepsis/SIRS Patient on admission met criteria for sepsis/Sirs with a temperature of 101.3, tachypnea, leukocytosis, slightly elevated lactic acid level of 2.1.  CRP was elevated concerning for underlying infection.  COVID-19 and influenza screening was negative.  CT chest which was done was negative for any  infiltrate.  Patient pancultured with blood cultures pending with no growth to date.  Urine cultures with 10,000 colonies of E. coli.  MRSA PCR negative.  Patient started on empiric broad-spectrum IV Rocephin which we will follow for now.  Saline lock IV fluids.  Patient's Cozaar has been resumed.  If cultures  remain negative could likely transition to oral antibiotics and treat empirically for 5 to 7 days.   2.  Noncardiac chest pain Likely noncardiac chest pain.  Likely GI and musculoskeletal.  High-sensitivity troponins which were done were negative x2.  EKG which was done which was personally reviewed by me and interpreted showed LVH with repolarization with no ischemic changes noted.  2D echo done with a EF of 60 to 65% with left ventricular septal wall thickness mildly increased, mild LVH, diastolic dysfunction, normal right ventricular systolic function, trivial pericardial effusion present.  No wall motion abnormalities.  Patient with chest wall tenderness to palpation.  CT chest which was done was negative for any acute infiltrate.  Patient also with complaints of midsternal to epigastric pain and stated that oral intake of her breakfast this morning with solids as well as liquids exacerbated her chest pain.  Patient states improvement with chest pain since admission.  Patient with history of relapsing MS on chronic immunosuppressants and Addison's disease with esophageal dysmotility and GERD.  Patient states being followed by Dr. [facility]isa [facility]ne of [facility] gastro[facility]ology and has received Botox injections x2 before in the past.  Patient refusing Ultram and as such Ultram has been discontinued.  Patient placed on Voltaren gel.  Change IV PPI to oral PPI twice daily.  Continue dysphagia 3 diet.  Patient seen by GI and patient status post upper endoscopy 09/09/2019 which showed small hiatal hernia, benign esophageal stenosis, esophageal motility disorder, erythematous mucosa in the prepyloric region of the stomach, few gastric polyps.  GI following and appreciate input and recommendations.   3.  Adrenal insufficiency. Patient on chronic steroids which was resumed. Due to concern for sepsis home dose prednisone was doubled however patient noted to have refused double her home dose prednisone and as such was placed back on  home regimen of 4 mg of prednisone daily.  Outpatient follow-up.   4.  Relapsing MS Stable.  Outpatient follow-up.  5.  Hypothyroidism TSH is 0.547.  Continue Synthroid.  Outpatient follow-up.   6.  Gastroesophageal reflux disease/esophageal dysmotility/small hiatal hernia Being followed in the outpatient setting by Dr. Therisa Doyne.  See problem #2.  Change IV Protonix to oral Protonix twice daily.  Continue current soft diet.  Patient underwent upper endoscopy today 09/09/2019 which showed small hiatal hernia, benign esophageal stenosis, esophageal motility disorder, erythematous mucosa in the prepyloric region of the stomach, few gastric polyps.  Will need outpatient follow-up with GI.    7.  Chronic back pain Status post spinal stimulator on chronic morphine.  Continue home pain regimen.  8.  Hypertension Blood pressure borderline as such Norvasc and Cozaar were discontinued.  Blood pressure improved.  Saline lock IV fluids.  Resume home regimen Cozaar.   9..  History of breast cancer Status post mastectomy.  Outpatient follow-up.   DVT prophylaxis: Lovenox Code Status: Full Family Communication: Updated patient.  No family at bedside. Disposition Plan: Home when clinically improved hopefully tomorrow.    Consultants:   Gastroenterology: Dr. Penelope Coop 09/07/2019  Procedures:   2D echo 09/07/2019  CT chest without contrast 09/06/2019  Chest x-ray 09/05/2019  Upper endoscopy per Dr. Watt Climes 09/09/2019  Antimicrobials:  IV Rocephin 09/06/2019   Subjective: Patient in bed.  Patient just returned from upper endoscopy this morning.  Patient states still with chest pain however slowly improving.  Patient denies any shortness of breath.   Objective: Vitals:   09/09/19 0812 09/09/19 0914 09/09/19 0920 09/09/19 0930  BP: (!) 191/96 (!) 176/90 (!) 176/90 (!) 141/74  Pulse:  88 87 79  Resp: 17 18 (!) 23 15  Temp: 97.7 F (36.5 C) (!) 97.5 F (36.4 C)    TempSrc: Tympanic Temporal      SpO2: 100% 100% 100% 98%  Weight:      Height:        Intake/Output Summary (Last 24 hours) at 09/09/2019 1053 Last data filed at 09/09/2019 0914 Gross per 24 hour  Intake 2293.13 ml  Output 200 ml  Net 2093.13 ml   Filed Weights   09/07/19 0600 09/08/19 0431 09/09/19 0501  Weight: 62.9 kg 63.2 kg 64.3 kg    Examination:  General exam: NAD. Respiratory system: Lungs clear to auscultation bilaterally.  No wheezes, no crackles, no rhonchi.  Normal respiratory effort. Cardiovascular system: RRR no murmurs rubs or gallops.  No JVD.  No lower extremity edema.  Chest wall with less tenderness to palpation.  Gastrointestinal system: Abdomen is nontender, nondistended, soft, positive bowel sounds.  No rebound.  No guarding.  Central nervous system: Alert and oriented. No focal neurological deficits. Extremities: Symmetric 5 x 5 power. Skin: No rashes, lesions or ulcers Psychiatry: Judgement and insight appear normal. Mood & affect appropriate.     Data Reviewed: I have personally reviewed following labs and imaging studies  CBC: Recent Labs  Lab 09/05/19 1711 09/06/19 1132 09/07/19 0729 09/08/19 0642 09/09/19 0422  WBC 10.2 8.4 5.8 5.6 5.0  NEUTROABS 7.4  --   --  3.2  --   HGB 14.1 13.7 13.0 12.0 10.9*  HCT 44.1 41.5 38.6 35.8* 34.1*  MCV 83.7 83.8 83.4 83.4 86.1  PLT 230 246 248 248 546   Basic Metabolic Panel: Recent Labs  Lab 09/05/19 1711 09/06/19 1132 09/07/19 0729 09/08/19 0642 09/09/19 0422  NA 135 136 134* 137 138  K 4.7 4.4 3.4* 4.2 3.5  CL 95* 98 98 105 106  CO2 24 21* '22 22 24  '$ GLUCOSE 68* 63* 87 79 75  BUN 13 19 26* 18 8  CREATININE 0.85 1.11* 1.05* 0.69 0.69  CALCIUM 9.0 8.8* 8.3* 8.1* 7.9*  MG  --   --   --  1.9  --    GFR: Estimated Creatinine Clearance: 60.9 mL/min (by C-G formula based on SCr of 0.69 mg/dL). Liver Function Tests: Recent Labs  Lab 09/05/19 1711  AST 31  ALT 12  ALKPHOS 58  BILITOT 0.9  PROT 7.7  ALBUMIN 4.1    Recent Labs  Lab 09/05/19 1711  LIPASE 15   Recent Labs  Lab 09/05/19 1831  AMMONIA 22   Coagulation Profile: No results for input(s): INR, PROTIME in the last 168 hours. Cardiac Enzymes: No results for input(s): CKTOTAL, CKMB, CKMBINDEX, TROPONINI in the last 168 hours. BNP (last 3 results) No results for input(s): PROBNP in the last 8760 hours. HbA1C: No results for input(s): HGBA1C in the last 72 hours. CBG: Recent Labs  Lab 09/06/19 1434  GLUCAP 100*   Lipid Profile: No results for input(s): CHOL, HDL, LDLCALC, TRIG, CHOLHDL, LDLDIRECT in the last 72 hours. Thyroid Function Tests: No results for input(s): TSH, T4TOTAL, FREET4, T3FREE, THYROIDAB in the last 72  hours. Anemia Panel: No results for input(s): VITAMINB12, FOLATE, FERRITIN, TIBC, IRON, RETICCTPCT in the last 72 hours. Sepsis Labs: Recent Labs  Lab 09/05/19 1711 09/06/19 1132 09/06/19 1637  PROCALCITON  --   --  0.39  LATICACIDVEN 2.1* 1.3  --     Recent Results (from the past 240 hour(s))  SARS Coronavirus 2 [facility] order, Performed in [facility] hospital lab) Nasopharyngeal Nasopharyngeal Swab     Status: None   Collection Time: 09/05/19  5:11 PM   Specimen: Nasopharyngeal Swab  Result Value Ref Range Status   SARS Coronavirus 2 NEGATIVE NEGATIVE Final    Comment: (NOTE) If result is NEGATIVE SARS-CoV-2 target nucleic acids are NOT DETECTED. The SARS-CoV-2 RNA is generally detectable in upper and lower  respiratory specimens during the acute phase of infection. The lowest  concentration of SARS-CoV-2 viral copies this assay can detect is 250  copies / mL. A negative result does not preclude SARS-CoV-2 infection  and should not be used as the sole basis for treatment or other  patient management decisions.  A negative result may occur with  improper specimen collection / handling, submission of specimen other  than nasopharyngeal swab, presence of viral mutation(s) within the  areas  targeted by this assay, and inadequate number of viral copies  (<250 copies / mL). A negative result must be combined with clinical  observations, patient history, and epidemiological information. If result is POSITIVE SARS-CoV-2 target nucleic acids are DETECTED. The SARS-CoV-2 RNA is generally detectable in upper and lower  respiratory specimens dur ing the acute phase of infection.  Positive  results are indicative of active infection with SARS-CoV-2.  Clinical  correlation with patient history and other diagnostic information is  necessary to determine patient infection status.  Positive results do  not rule out bacterial infection or co-infection with other viruses. If result is PRESUMPTIVE POSTIVE SARS-CoV-2 nucleic acids MAY BE PRESENT.   A presumptive positive result was obtained on the submitted specimen  and confirmed on repeat testing.  While 2019 novel coronavirus  (SARS-CoV-2) nucleic acids may be present in the submitted sample  additional confirmatory testing may be necessary for epidemiological  and / or clinical management purposes  to differentiate between  SARS-CoV-2 and other Sarbecovirus currently known to infect humans.  If clinically indicated additional testing with an alternate test  methodology [phone]) is advised. The SARS-CoV-2 RNA is generally  detectable in upper and lower respiratory sp ecimens during the acute  phase of infection. The expected result is Negative. Fact Sheet for Patients:  StrictlyIdeas.no Fact Sheet for Healthcare Providers: BankingDealers.co.za This test is not yet approved or cleared by the [place] FDA and has been authorized for detection and/or diagnosis of SARS-CoV-2 by FDA under an Emergency Use Authorization (EUA).  This EUA will remain in effect (meaning this test can be used) for the duration of the COVID-19 declaration under Section 564(b)(1) of the Act, 21 U.S.C. section  360bbb-3(b)(1), unless the authorization is terminated or revoked sooner. Performed at [facility], [address], [place], [place] [address]   Urine culture     Status: Abnormal   Collection Time: 09/05/19  8:16 PM   Specimen: Urine, Random  Result Value Ref Range Status   Specimen Description   Final    URINE, RANDOM Performed at [facility], [address] [place], [place] [address]    Special Requests   Final    NONE Performed at [facility], [address] [place], [place] [address]    Culture 10,000 COLONIES/mL ESCHERICHIA COLI (A)  Final   Report Status 09/08/2019 FINAL  Final   Organism ID, Bacteria ESCHERICHIA COLI (A)  Final      Susceptibility   Escherichia coli - MIC*    AMPICILLIN >=32 RESISTANT Resistant     CEFAZOLIN <=4 SENSITIVE Sensitive     CEFTRIAXONE <=1 SENSITIVE Sensitive     CIPROFLOXACIN <=0.25 SENSITIVE Sensitive     GENTAMICIN <=1 SENSITIVE Sensitive     IMIPENEM <=0.25 SENSITIVE Sensitive     NITROFURANTOIN 32 SENSITIVE Sensitive     TRIMETH/SULFA <=20 SENSITIVE Sensitive     AMPICILLIN/SULBACTAM >=32 RESISTANT Resistant     PIP/TAZO <=4 SENSITIVE Sensitive     Extended ESBL NEGATIVE Sensitive     * 10,000 COLONIES/mL ESCHERICHIA COLI  Culture, blood (routine x 2)     Status: None (Preliminary result)   Collection Time: 09/06/19  3:35 PM   Specimen: BLOOD LEFT HAND  Result Value Ref Range Status   Specimen Description BLOOD LEFT HAND  Final   Special Requests   Final    BOTTLES DRAWN AEROBIC ONLY Blood Culture results may not be optimal due to an inadequate volume of blood received in culture bottles   Culture   Final    NO GROWTH 2 DAYS Performed at [facility], [address], [place], [place] [address]    Report Status PENDING  Incomplete  Culture, blood (routine x 2)     Status: None (Preliminary result)   Collection Time: 09/06/19  3:41 PM   Specimen: BLOOD RIGHT HAND  Result Value Ref  Range Status   Specimen Description BLOOD RIGHT HAND  Final   Special Requests   Final    BOTTLES DRAWN AEROBIC ONLY Blood Culture results may not be optimal due to an inadequate volume of blood received in culture bottles   Culture   Final    NO GROWTH 2 DAYS Performed at [facility], [place] [address], [place], [place] [address]    Report Status PENDING  Incomplete  MRSA PCR Screening     Status: None   Collection Time: 09/06/19  4:39 PM   Specimen: Nasopharyngeal  Result Value Ref Range Status   MRSA by PCR NEGATIVE NEGATIVE Final    Comment:        The GeneXpert MRSA Assay (FDA approved for NASAL specimens only), is one component of a comprehensive MRSA colonization surveillance program. It is not intended to diagnose MRSA infection nor to guide or monitor treatment for MRSA infections. Performed at [facility], [place] [address] [place], [place] [address]          Radiology Studies: No results found.      Scheduled Meds:  diclofenac sodium  2 g Topical QID   enoxaparin (LOVENOX) injection  40 mg Subcutaneous Q24H   levothyroxine  50 mcg Oral QODAY   levothyroxine  75 mcg Oral QODAY   losartan  100 mg Oral QPM   pantoprazole (PROTONIX) IV  40 mg Intravenous Q12H   predniSONE  4 mg Oral Q breakfast   senna-docusate  1 tablet Oral BID   sodium chloride flush  10-40 mL Intracatheter Q12H   Continuous Infusions:  sodium chloride 100 mL/hr at 09/08/19 1708   cefTRIAXone (ROCEPHIN)  IV Stopped (09/08/19 1340)     LOS: 3 days    Time spent: 40 minutes  Irine Seal, MD [facility]  If 7PM-7AM, please contact night-coverage [email or web address] 09/09/2019, 10:53 AM

## 2019-09-09 NOTE — Anesthesia Preprocedure Evaluation (Signed)
Anesthesia Evaluation  Patient identified by MRN, date of birth, ID band Patient awake    Reviewed: Allergy & Precautions, NPO status , Patient's Chart, lab work & pertinent test results  History of Anesthesia Complications (+) PONV  Airway Mallampati: I  TM Distance: >3 FB Neck ROM: Full    Dental   Pulmonary asthma , sleep apnea ,    Pulmonary exam normal        Cardiovascular hypertension, Pt. on medications Normal cardiovascular exam     Neuro/Psych    GI/Hepatic GERD  Medicated,  Endo/Other    Renal/GU      Musculoskeletal   Abdominal   Peds  Hematology   Anesthesia Other Findings   Reproductive/Obstetrics                             Anesthesia Physical Anesthesia Plan  ASA: II  Anesthesia Plan: MAC   Post-op Pain Management:    Induction: Intravenous  PONV Risk Score and Plan: Treatment may vary due to age or medical condition  Airway Management Planned: Nasal Cannula  Additional Equipment:   Intra-op Plan:   Post-operative Plan:   Informed Consent: I have reviewed the patients History and Physical, chart, labs and discussed the procedure including the risks, benefits and alternatives for the proposed anesthesia with the patient or authorized representative who has indicated his/her understanding and acceptance.       Plan Discussed with: CRNA and Surgeon  Anesthesia Plan Comments:         Anesthesia Quick Evaluation

## 2019-09-09 NOTE — Anesthesia Postprocedure Evaluation (Signed)
Anesthesia Post Note  Patient: Daisy [name]  Procedure(s) Performed: ESOPHAGOGASTRODUODENOSCOPY (EGD) WITH PROPOFOL (N/A ) BOTOX INJECTION     Patient location during evaluation: PACU Anesthesia Type: MAC Level of consciousness: awake and alert Pain management: pain level controlled Vital Signs Assessment: post-procedure vital signs reviewed and stable Respiratory status: spontaneous breathing, nonlabored ventilation, respiratory function stable and patient connected to nasal cannula oxygen Cardiovascular status: stable and blood pressure returned to baseline Postop Assessment: no apparent nausea or vomiting Anesthetic complications: no    Last Vitals:  Vitals:   09/09/19 0920 09/09/19 0930  BP: (!) 176/90 (!) 141/74  Pulse: 87 79  Resp: (!) 23 15  Temp:    SpO2: 100% 98%    Last Pain:  Vitals:   09/09/19 0914  TempSrc: Temporal  PainSc: 0-No pain                 Maisee Vollman DAVID

## 2019-09-10 ENCOUNTER — Encounter (HOSPITAL_COMMUNITY): Payer: Self-pay | Admitting: Gastroenterology

## 2019-09-10 DIAGNOSIS — R509 Fever, unspecified: Secondary | ICD-10-CM

## 2019-09-10 LAB — BASIC METABOLIC PANEL
Anion gap: 10 (ref 5–15)
BUN: 5 mg/dL — ABNORMAL LOW (ref 8–23)
CO2: 23 mmol/L (ref 22–32)
Calcium: 8.4 mg/dL — ABNORMAL LOW (ref 8.9–10.3)
Chloride: 104 mmol/L (ref 98–111)
Creatinine, Ser: 0.74 mg/dL (ref 0.44–1.00)
GFR calc Af Amer: >60 mL/min (ref >60)
GFR calc non Af Amer: >60 mL/min (ref >60)
Glucose, Bld: 71 mg/dL (ref 70–99)
Potassium: 3.8 mmol/L (ref 3.5–5.1)
Sodium: 137 mmol/L (ref 135–145)

## 2019-09-10 LAB — CBC
HCT: 36.6 % (ref 36.0–46.0)
Hemoglobin: 12 g/dL (ref 12.0–15.0)
MCH: 27.2 pg (ref 26.0–34.0)
MCHC: 32.8 g/dL (ref 30.0–36.0)
MCV: 83 fL (ref 80.0–100.0)
Platelets: 182 10*3/uL (ref 150–400)
RBC: 4.41 MIL/uL (ref 3.87–5.11)
RDW: 19.4 % — ABNORMAL HIGH (ref 11.5–15.5)
WBC: 4.8 10*3/uL (ref 4.0–10.5)
nRBC: 0 % (ref 0.0–0.2)

## 2019-09-10 MED ORDER — CEPHALEXIN 500 MG PO CAPS: 500.0000 mg | ORAL_CAPSULE | Freq: Three times a day (TID) | ORAL | 0 refills | Status: AC

## 2019-09-10 MED ORDER — HYDRALAZINE HCL 25 MG PO TABS
25.0000 mg | ORAL_TABLET | Freq: Four times a day (QID) | ORAL | Status: DC | PRN
  Administered 2019-09-10: 25 mg via ORAL
  Filled 2019-09-10: qty 1

## 2019-09-10 NOTE — Discharge Summary (Signed)
Physician Discharge Summary  Daisy [name]:[id number] DOB: August 01, 1951  PCP: Lawerance Cruel, MD  Admitted from: Home Discharged to: Home  Admit date: 09/05/2019 Discharge date: 09/10/2019  Recommendations for Outpatient Follow-up:   Follow-up Information    Lawerance Cruel, MD. Schedule an appointment as soon as possible for a visit in 1 week(s).   Specialty: Family Medicine Why: To be seen with repeat labs (CBC with differential & CMP). Contact information: [address] [place] [address] [phone]        Ronnette Juniper, MD. Schedule an appointment as soon as possible for a visit.   Specialty: Gastroenterology Contact information: [address] [place] [address] [phone]            Home Health: None Equipment/Devices: None  Discharge Condition: Improved and stable CODE STATUS: Full Diet recommendation: Heart healthy, soft diet.  Discharge Diagnoses:  Principal Problem:   Sepsis ([place]) Active Problems:   Addison's disease ([place])   Hypothyroidism   History of left breast cancer   Pericarditis   Chest pain   Esophageal dysmotility   Brief Summary: 68 year old married female, independent, extensive PMH including but not limited to Addison's disease on chronic prednisone, chronic kidney disease, relapsing multiple sclerosis on chronic immunosuppressants/prednisone, hypothyroidism, chronic pain s/p spinal stimulator's x2, breast cancer s/p bilateral mastectomy, GERD and esophageal dysmotility related to MS, presented to ED due to fever, dyspnea and chest pain that woke her up from her sleep on the morning prior to admission.  She called her PCP who advised her to come to the hospital for further evaluation.  She also complained of generalized malaise over the last 4 days PTA after recent visit with her ENT physician.  While leaving the ENT MDs office, she reported feeling dizzy, nauseous and everything went dark in front of her  eyes for a few seconds but she denied presyncope or syncope.  Patient thereafter had some nonbloody emesis.  No recent change in her medication regimen.  She reports telemedicine visit back in July due to nausea, difficulty swallowing, intermittent chills and sweats.  She was treated twice with Diflucan and then ondansetron for nausea.  She has received Botox injections for her dysphagia by her GI MD.  She reports remote family history of TB in the 1990s and apparently was treated with 3 months of INH by ID and could not tolerate the full month course.  Today she reports that her PCP did 2 tests which seemed indeterminate, she was referred to [facility] ID but missed appointment x2 due to spouse being sick with colon cancer and then she did not pursue it.  In the ED, febrile up to 101.3 F, tachycardic, tachypneic.  WBC 10.2, lactate 2.1, CRP 3.9, BNP 31.8, HS troponin 6, d-dimer 0.59.  Chest x-ray unremarkable.  COVID-19 and flu panel PCR negative.  EKG showed some ST elevation concerning for pericarditis.  CT chest without contrast showed mild fibrosis of the left upper lobe related to posttreatment changes, small hiatal hernia containing pill fragments, kidney stones, compression fracture of L1.  Patient was given a full dose of aspirin, 4 mg of morphine and Zofran and was admitted due to concern for pericarditis.  Assessment and plan  1. SIRS/febrile illness: Patient met criteria for SIRS on admission including fever, tachypnea, leukocytosis, mildly elevated lactate and CRP.  Sepsis ruled out.  As noted above, extensive testing did not reveal source for infection.  COVID-19 and flu panel PCR  negative.  CT chest negative for acute infiltrate.  Blood cultures x2: Negative to date.  Urine culture shows 10 K colonies of E. coli but patient lacks UTI symptoms.  MRSA PCR negative.  She was treated with IV ceftriaxone and has completed 4 days course.  She has defervesced without recurrence of fever since  time of admission.  No leukocytosis.  No clear symptoms or signs at this time to suggest ongoing infection or a source.  It is possible that her febrile illness may have been due to nonspecific viral illness.  However given history of immunocompromise status due to being on chronic prednisone, it is reasonable to complete a total 7 days course of antibiotics.  I discussed in detail with infectious disease team on call who agreed and recommended transitioning to oral Keflex to complete total 7 days course.  Close outpatient follow-up with her PCP. 2. Atypical noncardiac chest pain: Possibly GI versus musculoskeletal. HS troponins x2-.  EKG showed LVH with repolarization and no ischemic changes.  TTE showed LVEF of 60-65% with LV septal wall thickness mildly increased, mild LVH, diastolic dysfunction, normal right ventricular systolic function, trivial pericardial effusion and no wall motion abnormalities.  She did have chest wall tenderness to palpation.  CT chest was negative for acute infiltrate.  She also reported midsternal to epigastric pain after oral intake with solids and liquids which worsened her pain.  She has history of esophageal dysmotility and GERD followed by outpatient GI and has received Botox injections x2 in the past.  PPI was continued.  Placed on soft diet.  [facility] was consulted and she underwent EGD which showed small hiatal hernia, benign esophageal stenosis, esophageal motility disorder, erythematous mucosa in the prepyloric region of the stomach and few gastric polyps.  On day of discharge, patient states that her chest pain has resolved without recurrence. 3. Addison's disease: Continue prior home dose of prednisone. 4. Relapsing multiple sclerosis: Stable.  Patient follows up with her PCP and pain management.  She does not follow-up with a neurologist. 5. Hypothyroid: TSH 0.547.  Continue prior home dose of Synthroid. 6. GERD/esophageal dysmotility/small hiatal hernia: Follows with  outpatient [facility].  PPI briefly changed to IV.  Placed on soft diet.  Status post EGD with results as documented below.  Tolerating diet with some nausea which seems to be chronic but denies vomiting or abdominal pain.  Indicates that her swallowing has significantly improved.  GI follow-up appreciated and have signed off and recommend outpatient follow-up in 1 to 2 months. 7. Chronic back pain: States that she has had 2 stimulators placed.  Follows with Dr. Nicholaus Bloom, pain management.  Continue prior home dose of morphine. 8. Essential hypertension: Reasonably controlled.  Continue prior home regimen including Cozaar and PRN amlodipine 9. History of breast cancer, s/p mastectomy 10. Family history of TB/TB prophylaxis treatment remotely: Unsure why she was being referred to [facility] ID, deferred to PCP regarding need for outpatient follow-up with ID   Consultations:  [facility].  Procedures:  EGD 09/09/2019: Impression: - Small hiatal hernia. - Benign-appearing esophageal stenosis. - Esophageal motility disorder. - Normal duodenal bulb, first portion of the duodenum, second portion of the duodenum and third portion of the duodenum. - The examination was otherwise normal. - Erythematous mucosa in the prepyloric region of the stomach. - A few gastric polyps. - No specimens collected.  TTE 09/06/2019:  IMPRESSIONS    1. Left ventricular ejection fraction, by visual estimation, is 60 to 65%.  The left ventricle has normal function. Normal left ventricular size. Left ventricular septal wall thickness was mildly increased. Mildly increased left ventricular posterior  wall thickness. There is mildly increased left ventricular hypertrophy.  2. Left ventricular diastolic Doppler parameters are consistent with impaired relaxation pattern of LV diastolic filling.  3. Mid cavitary gradient peak velocity 1.4 m/s. Peak gradient 7.6 mmHg.  4. Global right ventricle has normal systolic  function.The right ventricular size is normal. No increase in right ventricular wall thickness.  5. Left atrial size was normal.  6. Right atrial size was normal.  7. The pericardial effusion is posterior to the left ventricle.  8. Trivial pericardial effusion is present.  9. The mitral valve is normal in structure. No evidence of mitral valve regurgitation. No evidence of mitral stenosis. 10. The tricuspid valve is normal in structure. Tricuspid valve regurgitation is trivial. 11. The aortic valve is normal in structure. Aortic valve regurgitation was not visualized by color flow Doppler. Structurally normal aortic valve, with no evidence of sclerosis or stenosis. 12. The pulmonic valve was normal in structure. Pulmonic valve regurgitation is not visualized by color flow Doppler. 13. Normal pulmonary artery systolic pressure. 14. The inferior vena cava is normal in size with greater than 50% respiratory variability, suggesting right atrial pressure of 3 mmHg.  Discharge Instructions  Discharge Instructions    Call MD for:  difficulty breathing, headache or visual disturbances   Complete by: As directed    Call MD for:  extreme fatigue   Complete by: As directed    Call MD for:  persistant dizziness or light-headedness   Complete by: As directed    Call MD for:  persistant nausea and vomiting   Complete by: As directed    Call MD for:  severe uncontrolled pain   Complete by: As directed    Call MD for:  temperature >100.4   Complete by: As directed    Diet - low sodium heart healthy   Complete by: As directed    Soft diet.   Increase activity slowly   Complete by: As directed        Medication List    TAKE these medications   amLODipine 2.5 MG tablet Commonly known as: NORVASC Take 2.5 mg by mouth See admin instructions. Take 2.5 mg by mouth one to two times a day as needed if Systolic number is 400 or greater   cephALEXin 500 MG capsule Commonly known as: KEFLEX Take 1  capsule (500 mg total) by mouth 3 (three) times daily for 3 days.   diphenhydrAMINE 25 mg capsule Commonly known as: BENADRYL Take 50 mg by mouth every 8 (eight) hours as needed (for allergies or runny eyes).   hydrOXYzine 25 MG tablet Commonly known as: ATARAX/VISTARIL Take 25-50 mg by mouth every 8 (eight) hours as needed for nausea or vomiting.   levothyroxine 50 MCG tablet Commonly known as: SYNTHROID Take 50-75 mcg by mouth See admin instructions. Take 50 mcg by mouth in the morning "on even-numbered days" and alternate with 75 mcg on "odd-numbered days"   losartan 100 MG tablet Commonly known as: COZAAR Take 100 mg by mouth See admin instructions. Take 100 mg by mouth in the morning UNLESS B/P IS <120/80   Lubricant Eye Drops PF 0.5 % Soln Generic drug: Carboxymethylcellulose Sod PF Apply 1-2 drops to eye 3 (three) times daily as needed (to lubricate the eyes).   morphine 30 MG tablet Commonly known as: MSIR Take 30 mg  by mouth every 8 (eight) hours.   ondansetron 4 MG disintegrating tablet Commonly known as: ZOFRAN-ODT Take 8 mg by mouth See admin instructions. Dissolve 8 mg under the tongue two to three times a day   pantoprazole 40 MG tablet Commonly known as: PROTONIX Take 40 mg by mouth daily before breakfast.   predniSONE 1 MG tablet Commonly known as: DELTASONE Take 4 mg by mouth daily with breakfast.   senna-docusate 8.6-50 MG tablet Commonly known as: Senokot-S Take 1 tablet 2 (two) times daily by mouth.   valACYclovir 1000 MG tablet Commonly known as: VALTREX Take 1,000 mg by mouth 3 (three) times daily as needed (for shingles outbreaks).   VASELINE LIP THERAPY EX Apply 1 application topically as needed (for dryness of the lips).   Viactiv [phone] MG-UNT-MCG Chew Generic drug: Calcium-Vitamin D-Vitamin K Chew 3 each by mouth daily.      Allergies  Allergen Reactions  . Alendronate Other (See Comments)    ESOPHAGEAL IRRITATION DANGER OF  ESOPHAGEAL PERF.  . Gabapentin Anaphylaxis  . Iodinated Diagnostic Agents Anaphylaxis and Other (See Comments)    Fever and blood clots, also ANAPHYLACTIC REACTION!-kmr-10/16/05  . Iodine-131 Anaphylaxis  . Iohexol Anaphylaxis and Other (See Comments)    ANAPHYLACTIC REACTION!-kmr-10/16/05   . Procaine Anaphylaxis, Rash and Other (See Comments)    Novocaine caused shaking, rash, chest heaviness, made patient feel sick    . Sulfa Antibiotics Swelling and Other (See Comments)    SWELLING REACTION UNSPECIFIED  HALLUCINATIONS   . Terbinafine Hcl Anaphylaxis and Other (See Comments)    THROAT CLOSE  . Terbinafine Hcl Anaphylaxis and Other (See Comments)    THROAT CLOSED  . Amlodipine Swelling and Other (See Comments)    SWELLING REACTION (patient is currently taking with mild fluid retention)  . Amoxicillin-Pot Clavulanate Diarrhea and Other (See Comments)    Bad headaches (tolerates PCN, per husband) Has patient had a PCN reaction causing immediate rash, facial/tongue/throat swelling, SOB or lightheadedness with hypotension: no Has patient had a PCN reaction causing severe rash involving mucus membranes or skin necrosis: no Has patient had a PCN reaction that required hospitalization no Has patient had a PCN reaction occurring within the last 10 years: no If all of the above answers are "NO", then may proceed with Cephalosporin use.  . Clarithromycin Anxiety and Other (See Comments)    Caused the patient to be jittery  . Clindamycin/Lincomycin Other (See Comments)    Reaction not recalled  . Compazine Other (See Comments)    Hyper and shaky  . Dilaudid [Hydromorphone Hcl] Nausea And Vomiting and Other (See Comments)    HEADACHES  . Guaifenesin Nausea And Vomiting, Swelling and Other (See Comments)    SWELLING REACTION UNSPECIFIED  HYPERACTIVITY ALSO  . Guaifenesin Nausea And Vomiting, Swelling, Anxiety and Other (See Comments)    Headaches and hyperactivity (SWELLING REACTION  UNSPECIFIED)  . Hydromorphone Nausea And Vomiting and Other (See Comments)    HEADACHE >> MIGRAINE   . Hydromorphone Hcl Nausea And Vomiting and Other (See Comments)    HEADACHES, also  . Imiquimod Other (See Comments)    "Active and sick "  . Lisinopril Cough    Other reaction(s): Cough (ALLERGY/intolerance), Other  . Methocarbamol Other (See Comments)    "Makes pt feel irritable "   . Metoclopramide Other (See Comments)    "Hyper and shaky"   . Metoclopramide Hcl Other (See Comments)    Hyper, shaky  . Other Hives, Rash and Other (  See Comments)    SOME TAPES CAUSE SKIN IRRITATION  . Prochlorperazine Anxiety and Other (See Comments)    "Hyper and shaky"  . Promethazine Anxiety and Other (See Comments)    DIZZINESS, also  . Sucralfate Nausea Only and Other (See Comments)    Distends stomach and hyperactivity   . Sulfamethoxazole-Trimethoprim Other (See Comments)    ALTERED MENTAL STATUS HALLUCINATIONS    . Trifluoperazine Other (See Comments)    Hallucinations   . Vancomycin Swelling and Other (See Comments)    SWELLING REACTION UNSPECIFIED   . Zolpidem Nausea And Vomiting and Other (See Comments)    Caused the patient to be shaky, too  . Metformin Other (See Comments)    Headaches and migraines       Procedures/Studies: Ct Chest Wo Contrast  Result Date: 09/06/2019 CLINICAL DATA:  Shortness of breath EXAM: CT CHEST WITHOUT CONTRAST TECHNIQUE: Multidetector CT imaging of the chest was performed following the standard protocol without IV contrast. COMPARISON:  Chest x-ray 09/05/2019, CT chest 03/22/2015 FINDINGS: Cardiovascular: Limited evaluation without intravenous contrast. Mild aortic atherosclerosis. No aneurysmal dilatation. Negative for acute intramural hematoma. Heart size is normal. No significant pericardial effusion Mediastinum/Nodes: Midline trachea. Normal thyroid. No significantly enlarged lymph nodes. Small hiatal hernia containing pill fragments or dense  debris distally. Lungs/Pleura: No acute consolidation or effusion. Mild subpleural fibrosis and scarring in the left upper lobe and lingula Upper Abdomen: Partially visualized kidney stones bilaterally. Clips in the gallbladder fossa. Splenic granuloma. Dilated extrahepatic common bile duct up to 14 mm. This is unchanged. Musculoskeletal: Scoliosis of the spine. Chronic compression fracture at L1. Post mastectomy changes on the left IMPRESSION: 1. No focal airspace disease, pleural effusion or pneumothorax. Mild fibrosis in the left upper lobe, likely related to post treatment change 2. Small hiatal hernia containing pill fragments or dense debris within the distal lumen 3. Kidney stones 4. Chronic compression fracture at L1 Aortic Atherosclerosis (ICD10-I70.0). Electronically Signed   By: Donavan Foil M.D.   On: 09/06/2019 01:28   Dg Chest Portable 1 View  Result Date: 09/05/2019 CLINICAL DATA:  Cough, fever EXAM: PORTABLE CHEST 1 VIEW COMPARISON:  Radiograph 01/17/2018, CT 03/22/2015 FINDINGS: Lower cervical spine fusion hardware is seen. Left axillary surgical clips are noted. Postsurgical changes from prior bilateral mastectomy. Spinal nerve stimulator is noted. Cardiac monitoring leads overlie the chest. Bandlike region of scarring in the left mid lung is similar to prior. No consolidation, features of edema, pneumothorax, or effusion. Cardiomediastinal contours are unchanged from comparison studies accounting for differences in technique. The aorta is tortuous and partially calcified. No acute osseous or soft tissue abnormality. IMPRESSION: No acute cardiopulmonary abnormality, specifically no evidence of pneumonia. Electronically Signed   By: Lovena Le M.D.   On: 09/05/2019 18:34       Subjective: "Can I go home?".  Overall feels much better.  Still somewhat weak but improving.  Had some nausea earlier this morning which resolved after medications but denies vomiting, tolerating diet and having  regular BMs.  Specifically denies chest pain, dyspnea, cough, abdominal pain, dysuria or urinary frequency.  No fever since day of admission.  Indicates that she lives with her spouse, has a cane which she occasionally uses but mostly independent.  Swallowing has improved.  Discharge Exam:  Vitals:   09/10/19 0408 09/10/19 0411 09/10/19 0805 09/10/19 0847  BP:  (!) 169/83 135/85   Pulse:  87 (!) 116 100  Resp:  16 20   Temp:  98  F (36.7 C) 98.3 F (36.8 C)   TempSrc:  Oral Oral   SpO2:  97% 97%   Weight: 64 kg     Height:        General: Pleasant middle-aged female, moderately built and nourished sitting up comfortably in bed without distress.  Oral mucosa moist. Cardiovascular: S1 & S2 heard, RRR, S1/S2 +. No murmurs, rubs, gallops or clicks. No JVD or pedal edema.  Not on telemetry. Respiratory: Clear to auscultation without wheezing, rhonchi or crackles. No increased work of breathing. Abdominal:  Non distended, non tender & soft. No organomegaly or masses appreciated. Normal bowel sounds heard. CNS: Alert and oriented. No focal deficits. Extremities: no edema, no cyanosis    The results of significant diagnostics from this hospitalization (including imaging, microbiology, ancillary and laboratory) are listed below for reference.     Microbiology: Recent Results (from the past 240 hour(s))  SARS Coronavirus 2 [facility] order, Performed in [facility] hospital lab) Nasopharyngeal Nasopharyngeal Swab     Status: None   Collection Time: 09/05/19  5:11 PM   Specimen: Nasopharyngeal Swab  Result Value Ref Range Status   SARS Coronavirus 2 NEGATIVE NEGATIVE Final    Comment: (NOTE) If result is NEGATIVE SARS-CoV-2 target nucleic acids are NOT DETECTED. The SARS-CoV-2 RNA is generally detectable in upper and lower  respiratory specimens during the acute phase of infection. The lowest  concentration of SARS-CoV-2 viral copies this assay can detect is 250  copies / mL. A  negative result does not preclude SARS-CoV-2 infection  and should not be used as the sole basis for treatment or other  patient management decisions.  A negative result may occur with  improper specimen collection / handling, submission of specimen other  than nasopharyngeal swab, presence of viral mutation(s) within the  areas targeted by this assay, and inadequate number of viral copies  (<250 copies / mL). A negative result must be combined with clinical  observations, patient history, and epidemiological information. If result is POSITIVE SARS-CoV-2 target nucleic acids are DETECTED. The SARS-CoV-2 RNA is generally detectable in upper and lower  respiratory specimens dur ing the acute phase of infection.  Positive  results are indicative of active infection with SARS-CoV-2.  Clinical  correlation with patient history and other diagnostic information is  necessary to determine patient infection status.  Positive results do  not rule out bacterial infection or co-infection with other viruses. If result is PRESUMPTIVE POSTIVE SARS-CoV-2 nucleic acids MAY BE PRESENT.   A presumptive positive result was obtained on the submitted specimen  and confirmed on repeat testing.  While 2019 novel coronavirus  (SARS-CoV-2) nucleic acids may be present in the submitted sample  additional confirmatory testing may be necessary for epidemiological  and / or clinical management purposes  to differentiate between  SARS-CoV-2 and other Sarbecovirus currently known to infect humans.  If clinically indicated additional testing with an alternate test  methodology [phone]) is advised. The SARS-CoV-2 RNA is generally  detectable in upper and lower respiratory sp ecimens during the acute  phase of infection. The expected result is Negative. Fact Sheet for Patients:  StrictlyIdeas.no Fact Sheet for Healthcare Providers: BankingDealers.co.za This test is not  yet approved or cleared by the [place] FDA and has been authorized for detection and/or diagnosis of SARS-CoV-2 by FDA under an Emergency Use Authorization (EUA).  This EUA will remain in effect (meaning this test can be used) for the duration of the COVID-19 declaration under Section 564(b)(1)  of the Act, 21 U.S.C. section 360bbb-3(b)(1), unless the authorization is terminated or revoked sooner. Performed at [facility], [address] [place], [place] [address]   Urine culture     Status: Abnormal   Collection Time: 09/05/19  8:16 PM   Specimen: Urine, Random  Result Value Ref Range Status   Specimen Description   Final    URINE, RANDOM Performed at [facility], [address] [place], [place] [address]    Special Requests   Final    NONE Performed at [facility], [address] [place], [place] [address]    Culture 10,000 COLONIES/mL ESCHERICHIA COLI (A)  Final   Report Status 09/08/2019 FINAL  Final   Organism ID, Bacteria ESCHERICHIA COLI (A)  Final      Susceptibility   Escherichia coli - MIC*    AMPICILLIN >=32 RESISTANT Resistant     CEFAZOLIN <=4 SENSITIVE Sensitive     CEFTRIAXONE <=1 SENSITIVE Sensitive     CIPROFLOXACIN <=0.25 SENSITIVE Sensitive     GENTAMICIN <=1 SENSITIVE Sensitive     IMIPENEM <=0.25 SENSITIVE Sensitive     NITROFURANTOIN 32 SENSITIVE Sensitive     TRIMETH/SULFA <=20 SENSITIVE Sensitive     AMPICILLIN/SULBACTAM >=32 RESISTANT Resistant     PIP/TAZO <=4 SENSITIVE Sensitive     Extended ESBL NEGATIVE Sensitive     * 10,000 COLONIES/mL ESCHERICHIA COLI  Culture, blood (routine x 2)     Status: None (Preliminary result)   Collection Time: 09/06/19  3:35 PM   Specimen: BLOOD LEFT HAND  Result Value Ref Range Status   Specimen Description BLOOD LEFT HAND  Final   Special Requests   Final    BOTTLES DRAWN AEROBIC ONLY Blood Culture results may not be optimal due to an inadequate volume of blood  received in culture bottles   Culture   Final    NO GROWTH 4 DAYS Performed at [facility], [address], [place], [place] [address]    Report Status PENDING  Incomplete  Culture, blood (routine x 2)     Status: None (Preliminary result)   Collection Time: 09/06/19  3:41 PM   Specimen: BLOOD RIGHT HAND  Result Value Ref Range Status   Specimen Description BLOOD RIGHT HAND  Final   Special Requests   Final    BOTTLES DRAWN AEROBIC ONLY Blood Culture results may not be optimal due to an inadequate volume of blood received in culture bottles   Culture   Final    NO GROWTH 4 DAYS Performed at [facility], [place] [address] [place], [place] [address]    Report Status PENDING  Incomplete  MRSA PCR Screening     Status: None   Collection Time: 09/06/19  4:39 PM   Specimen: Nasopharyngeal  Result Value Ref Range Status   MRSA by PCR NEGATIVE NEGATIVE Final    Comment:        The GeneXpert MRSA Assay (FDA approved for NASAL specimens only), is one component of a comprehensive MRSA colonization surveillance program. It is not intended to diagnose MRSA infection nor to guide or monitor treatment for MRSA infections. Performed at [facility], [place] [address], [place], [place] [address]      Labs: CBC: Recent Labs  Lab 09/05/19 1711 09/06/19 1132 09/07/19 0729 09/08/19 0642 09/09/19 0422 09/10/19 0340  WBC 10.2 8.4 5.8 5.6 5.0 4.8  NEUTROABS 7.4  --   --  3.2  --   --   HGB 14.1 13.7 13.0 12.0 10.9* 12.0  HCT 44.1 41.5 38.6 35.8* 34.1* 36.6  MCV 83.7 83.8 83.4 83.4 86.1 83.0  PLT 230 246 248 248 234 371   Basic Metabolic Panel: Recent Labs  Lab 09/06/19 1132 09/07/19 0729 09/08/19 0642 09/09/19 0422 09/10/19 0340  NA 136 134* 137 138 137  K 4.4 3.4* 4.2 3.5 3.8  CL 98 98 105 106 104  CO2 21* '22 22 24 23  '$ GLUCOSE 63* 87 79 75 71  BUN 19 26* 18 8 5*  CREATININE 1.11* 1.05* 0.69 0.69 0.74  CALCIUM 8.8* 8.3* 8.1* 7.9* 8.4*  MG  --   --  1.9  --    --    Liver Function Tests: Recent Labs  Lab 09/05/19 1711  AST 31  ALT 12  ALKPHOS 58  BILITOT 0.9  PROT 7.7  ALBUMIN 4.1   BNP (last 3 results) Recent Labs    09/05/19 1711  BNP 31.8   Cardiac Enzymes: No results for input(s): CKTOTAL, CKMB, CKMBINDEX, TROPONINI in the last 168 hours. CBG: Recent Labs  Lab 09/06/19 1434  GLUCAP 100*   Hgb A1c No results for input(s): HGBA1C in the last 72 hours. Lipid Profile No results for input(s): CHOL, HDL, LDLCALC, TRIG, CHOLHDL, LDLDIRECT in the last 72 hours. Thyroid function studies No results for input(s): TSH, T4TOTAL, T3FREE, THYROIDAB in the last 72 hours.  Invalid input(s): FREET3 Anemia work up No results for input(s): VITAMINB12, FOLATE, FERRITIN, TIBC, IRON, RETICCTPCT in the last 72 hours. Urinalysis    Component Value Date/Time   COLORURINE YELLOW 09/05/2019 2016   APPEARANCEUR CLEAR 09/05/2019 2016   LABSPEC >1.030 (H) 09/05/2019 2016   PHURINE 5.5 09/05/2019 2016   GLUCOSEU NEGATIVE 09/05/2019 2016   HGBUR NEGATIVE 09/05/2019 2016   BILIRUBINUR SMALL (A) 09/05/2019 2016   KETONESUR >80 (A) 09/05/2019 2016   PROTEINUR NEGATIVE 09/05/2019 2016   UROBILINOGEN 1.0 05/17/2014 1753   NITRITE NEGATIVE 09/05/2019 2016   LEUKOCYTESUR NEGATIVE 09/05/2019 2016      Time coordinating discharge: 45 minutes  SIGNED:  Vernell Leep, MD, FACP, [facility]. [facility]  To contact the attending provider between 7A-7P or the covering provider during after hours 7P-7A, please log into the web site [email or web address] and access using universal [place] password for that web site. If you do not have the password, please call the hospital operator.

## 2019-09-10 NOTE — Discharge Instructions (Signed)

## 2019-09-10 NOTE — Progress Notes (Addendum)
Patient BP 169/83.  RN notified [facility].  Patient given PRN morphine for pain.   Order received for 25mg  hydralazine, prn

## 2019-09-10 NOTE — Care Management Important Message (Signed)
Important Message  Patient Details  Name: Daisy [name] MRN: [id number] Date of Birth: 02-24-1951   Medicare Important Message Given:  Yes     Shelda Altes 09/10/2019, 1:51 PM

## 2019-09-10 NOTE — Progress Notes (Signed)
[name] 9:53 AM  Subjective: Patient in good spirits without any new complaints and says her swallowing got better after the procedure and she is hopeful to go home soon if not today we again discussed her periodic bloating and discussed her case with her primary gastroenterologist Dr. Therisa Doyne as well  Objective: Vital signs stable afebrile no acute distress abdomen is soft nontender labs fine  Assessment: Improved  Plan: Please call me if I can be of any further assistance with this hospital stay other wise call Dr. Therisa Doyne as needed or follow-up with her in 1 to 2 months and consider flat and upright abdominal x-ray at the time of significant bloating to begin work-up  [facility]  office [phone] After 5PM or if no answer call [phone]

## 2019-09-11 LAB — CULTURE, BLOOD (ROUTINE X 2)
Culture: NO GROWTH
Culture: NO GROWTH

## 2019-09-17 DIAGNOSIS — Z09 Encounter for follow-up examination after completed treatment for conditions other than malignant neoplasm: Secondary | ICD-10-CM | POA: Diagnosis not present

## 2019-09-17 DIAGNOSIS — I10 Essential (primary) hypertension: Secondary | ICD-10-CM | POA: Diagnosis not present

## 2019-09-17 DIAGNOSIS — R635 Abnormal weight gain: Secondary | ICD-10-CM | POA: Diagnosis not present

## 2019-09-17 DIAGNOSIS — E271 Primary adrenocortical insufficiency: Secondary | ICD-10-CM | POA: Diagnosis not present

## 2019-09-17 DIAGNOSIS — H539 Unspecified visual disturbance: Secondary | ICD-10-CM | POA: Diagnosis not present

## 2019-09-17 DIAGNOSIS — A419 Sepsis, unspecified organism: Secondary | ICD-10-CM | POA: Diagnosis not present

## 2019-09-17 DIAGNOSIS — E8770 Fluid overload, unspecified: Secondary | ICD-10-CM | POA: Diagnosis not present

## 2019-09-17 DIAGNOSIS — R42 Dizziness and giddiness: Secondary | ICD-10-CM | POA: Diagnosis not present

## 2019-09-22 DIAGNOSIS — M4726 Other spondylosis with radiculopathy, lumbar region: Secondary | ICD-10-CM | POA: Diagnosis not present

## 2019-09-22 DIAGNOSIS — Z79891 Long term (current) use of opiate analgesic: Secondary | ICD-10-CM | POA: Diagnosis not present

## 2019-09-22 DIAGNOSIS — M41115 Juvenile idiopathic scoliosis, thoracolumbar region: Secondary | ICD-10-CM | POA: Diagnosis not present

## 2019-09-22 DIAGNOSIS — G894 Chronic pain syndrome: Secondary | ICD-10-CM | POA: Diagnosis not present

## 2019-09-25 DIAGNOSIS — K2100 Gastro-esophageal reflux disease with esophagitis, without bleeding: Secondary | ICD-10-CM | POA: Diagnosis not present

## 2019-09-25 DIAGNOSIS — I10 Essential (primary) hypertension: Secondary | ICD-10-CM | POA: Diagnosis not present

## 2019-09-25 DIAGNOSIS — R1030 Lower abdominal pain, unspecified: Secondary | ICD-10-CM | POA: Diagnosis not present

## 2019-09-25 DIAGNOSIS — E230 Hypopituitarism: Secondary | ICD-10-CM | POA: Diagnosis not present

## 2019-09-25 DIAGNOSIS — E039 Hypothyroidism, unspecified: Secondary | ICD-10-CM | POA: Diagnosis not present

## 2019-09-25 DIAGNOSIS — R110 Nausea: Secondary | ICD-10-CM | POA: Diagnosis not present

## 2019-09-25 DIAGNOSIS — G35 Multiple sclerosis: Secondary | ICD-10-CM | POA: Diagnosis not present

## 2019-09-26 DIAGNOSIS — I10 Essential (primary) hypertension: Secondary | ICD-10-CM | POA: Diagnosis not present

## 2019-09-26 DIAGNOSIS — E039 Hypothyroidism, unspecified: Secondary | ICD-10-CM | POA: Diagnosis not present

## 2019-09-26 DIAGNOSIS — M810 Age-related osteoporosis without current pathological fracture: Secondary | ICD-10-CM | POA: Diagnosis not present

## 2019-09-26 DIAGNOSIS — Z853 Personal history of malignant neoplasm of breast: Secondary | ICD-10-CM | POA: Diagnosis not present

## 2019-09-26 DIAGNOSIS — J45909 Unspecified asthma, uncomplicated: Secondary | ICD-10-CM | POA: Diagnosis not present

## 2019-10-27 DIAGNOSIS — I10 Essential (primary) hypertension: Secondary | ICD-10-CM | POA: Diagnosis not present

## 2019-10-27 DIAGNOSIS — Z853 Personal history of malignant neoplasm of breast: Secondary | ICD-10-CM | POA: Diagnosis not present

## 2019-10-27 DIAGNOSIS — J45909 Unspecified asthma, uncomplicated: Secondary | ICD-10-CM | POA: Diagnosis not present

## 2019-10-27 DIAGNOSIS — M810 Age-related osteoporosis without current pathological fracture: Secondary | ICD-10-CM | POA: Diagnosis not present

## 2019-10-27 DIAGNOSIS — E039 Hypothyroidism, unspecified: Secondary | ICD-10-CM | POA: Diagnosis not present

## 2019-12-11 DIAGNOSIS — Z853 Personal history of malignant neoplasm of breast: Secondary | ICD-10-CM | POA: Diagnosis not present

## 2019-12-11 DIAGNOSIS — I10 Essential (primary) hypertension: Secondary | ICD-10-CM | POA: Diagnosis not present

## 2019-12-11 DIAGNOSIS — M810 Age-related osteoporosis without current pathological fracture: Secondary | ICD-10-CM | POA: Diagnosis not present

## 2019-12-11 DIAGNOSIS — E039 Hypothyroidism, unspecified: Secondary | ICD-10-CM | POA: Diagnosis not present

## 2019-12-11 DIAGNOSIS — J45909 Unspecified asthma, uncomplicated: Secondary | ICD-10-CM | POA: Diagnosis not present

## 2019-12-24 DIAGNOSIS — J040 Acute laryngitis: Secondary | ICD-10-CM | POA: Diagnosis not present

## 2019-12-24 DIAGNOSIS — I10 Essential (primary) hypertension: Secondary | ICD-10-CM | POA: Diagnosis not present

## 2019-12-24 DIAGNOSIS — B029 Zoster without complications: Secondary | ICD-10-CM | POA: Diagnosis not present

## 2019-12-31 DIAGNOSIS — J029 Acute pharyngitis, unspecified: Secondary | ICD-10-CM | POA: Diagnosis not present

## 2019-12-31 DIAGNOSIS — Z1152 Encounter for screening for COVID-19: Secondary | ICD-10-CM | POA: Diagnosis not present

## 2019-12-31 DIAGNOSIS — Z03818 Encounter for observation for suspected exposure to other biological agents ruled out: Secondary | ICD-10-CM | POA: Diagnosis not present

## 2019-12-31 DIAGNOSIS — R509 Fever, unspecified: Secondary | ICD-10-CM | POA: Diagnosis not present

## 2020-01-12 DIAGNOSIS — E230 Hypopituitarism: Secondary | ICD-10-CM | POA: Diagnosis not present

## 2020-01-12 DIAGNOSIS — I10 Essential (primary) hypertension: Secondary | ICD-10-CM | POA: Diagnosis not present

## 2020-01-12 DIAGNOSIS — M810 Age-related osteoporosis without current pathological fracture: Secondary | ICD-10-CM | POA: Diagnosis not present

## 2020-01-12 DIAGNOSIS — Z8781 Personal history of (healed) traumatic fracture: Secondary | ICD-10-CM | POA: Diagnosis not present

## 2020-01-12 DIAGNOSIS — K219 Gastro-esophageal reflux disease without esophagitis: Secondary | ICD-10-CM | POA: Diagnosis not present

## 2020-01-12 DIAGNOSIS — E039 Hypothyroidism, unspecified: Secondary | ICD-10-CM | POA: Diagnosis not present

## 2020-01-13 DIAGNOSIS — J029 Acute pharyngitis, unspecified: Secondary | ICD-10-CM | POA: Diagnosis not present

## 2020-01-13 DIAGNOSIS — R0989 Other specified symptoms and signs involving the circulatory and respiratory systems: Secondary | ICD-10-CM | POA: Diagnosis not present

## 2020-01-13 DIAGNOSIS — R509 Fever, unspecified: Secondary | ICD-10-CM | POA: Diagnosis not present

## 2020-01-13 DIAGNOSIS — J040 Acute laryngitis: Secondary | ICD-10-CM | POA: Diagnosis not present

## 2020-02-09 DIAGNOSIS — Z853 Personal history of malignant neoplasm of breast: Secondary | ICD-10-CM | POA: Diagnosis not present

## 2020-02-09 DIAGNOSIS — E039 Hypothyroidism, unspecified: Secondary | ICD-10-CM | POA: Diagnosis not present

## 2020-02-09 DIAGNOSIS — J45909 Unspecified asthma, uncomplicated: Secondary | ICD-10-CM | POA: Diagnosis not present

## 2020-02-09 DIAGNOSIS — I10 Essential (primary) hypertension: Secondary | ICD-10-CM | POA: Diagnosis not present

## 2020-02-09 DIAGNOSIS — M810 Age-related osteoporosis without current pathological fracture: Secondary | ICD-10-CM | POA: Diagnosis not present

## 2020-03-22 DIAGNOSIS — J45909 Unspecified asthma, uncomplicated: Secondary | ICD-10-CM | POA: Diagnosis not present

## 2020-03-22 DIAGNOSIS — Z853 Personal history of malignant neoplasm of breast: Secondary | ICD-10-CM | POA: Diagnosis not present

## 2020-03-22 DIAGNOSIS — I10 Essential (primary) hypertension: Secondary | ICD-10-CM | POA: Diagnosis not present

## 2020-03-22 DIAGNOSIS — E039 Hypothyroidism, unspecified: Secondary | ICD-10-CM | POA: Diagnosis not present

## 2020-03-22 DIAGNOSIS — M810 Age-related osteoporosis without current pathological fracture: Secondary | ICD-10-CM | POA: Diagnosis not present

## 2020-03-29 DIAGNOSIS — G894 Chronic pain syndrome: Secondary | ICD-10-CM | POA: Diagnosis not present

## 2020-03-29 DIAGNOSIS — M4726 Other spondylosis with radiculopathy, lumbar region: Secondary | ICD-10-CM | POA: Diagnosis not present

## 2020-03-29 DIAGNOSIS — M41115 Juvenile idiopathic scoliosis, thoracolumbar region: Secondary | ICD-10-CM | POA: Diagnosis not present

## 2020-03-29 DIAGNOSIS — Z79891 Long term (current) use of opiate analgesic: Secondary | ICD-10-CM | POA: Diagnosis not present

## 2020-04-14 ENCOUNTER — Other Ambulatory Visit: Payer: Self-pay | Admitting: Gastroenterology

## 2020-04-20 ENCOUNTER — Other Ambulatory Visit (HOSPITAL_COMMUNITY)
Admission: RE | Admit: 2020-04-20 | Discharge: 2020-04-20 | Disposition: A | Discharge status: Home / Self Care | Payer: Medicare Other | Source: Ambulatory Visit | Attending: Gastroenterology | Admitting: Gastroenterology

## 2020-04-20 DIAGNOSIS — Z20822 Contact with and (suspected) exposure to covid-19: Secondary | ICD-10-CM | POA: Diagnosis not present

## 2020-04-20 DIAGNOSIS — Z01812 Encounter for preprocedural laboratory examination: Secondary | ICD-10-CM | POA: Diagnosis not present

## 2020-04-20 LAB — SARS CORONAVIRUS 2 (TAT 6-24 HRS): SARS Coronavirus 2: NEGATIVE

## 2020-04-22 NOTE — H&P (Signed)
History of present illness:  69 year old female with difficulty swallowing, nausea and unintentional weight loss. Has had EGD in the past with Botox injection with relief of symptoms.  Current Medications  TakingAmlodipine Besylate 2.5 MG Tablet 1-4 tablets Orally Once a day as needed for high bloof pressure, to keep it under 140, Notes: ROSS    Azithromycin 250 MG Tablet 2 tablet on the first day, then 1 tablet daily for 4 days Orally Once a day    Furosemide 20 MG Tablet 1 tablet Orally Once or twice daily as needed for swelling, Notes: ROSS    HydrOXYzine HCl 25 MG Tablet 2 tablet as needed Orally every 8 hrs, Notes: ROSS    Levothyroxine Sodium 50 MCG Tablet 1 tab Orally Alternate 75 mg every other day w/50 mg tablet, Notes: ROSS    Levothyroxine Sodium 75 MCG Tablet 1 tablet in the morning on an empty stomach Orally Every other day alternate 50 mcg, Notes: KERR    Losartan Potassium 100 MG Tablet 1 tablet Orally Once a day, Notes: ROSS    Ondansetron 4 MG Tablet Disintegrating 1-2 tablets on the tongue and allow to dissolve, as needed for nausea Orally up to three times a day, Notes: ROSS    Pantoprazole Sodium 40 MG Tablet Delayed Release TAKE 1 TABLET BY MOUTH DAILY , Notes: ROSS    PredniSONE 1 MG Tablet 4 tablets [DOUBLE THE DOSE ON SICK DAYS] Orally once a day, Notes: ROSS    Valacyclovir HCl 1 GM Tablet 1 tablet Orally Three times a day, Notes: Listed in Epic    Calcium + D + K(Calcium-Vitamin D-Vitamin K) ? Tablet 3 chewables Orally once a day, Notes: VIACTIV    Diclofenac Sodium 1 % Cream as directed Transdermal    DiphenhydrAMINE HCl 25 MG Capsule 1 capsule at bedtime as needed Orally Once a day    Lubricant Eye Drops PF(Carboxymethylcellulose Sod PF) 0.5 % Solution 2 drops into both eyes to lubricate the eyes Ophthalmic three times a day, Notes: Listed in Epic    Morphine Sulfate 30 MG Tablet (Schedule II Drug) 1 TAB EVERY 8 HRS AS NEEDED FOR PAIN TAKE 1 TABLET BY  MOUTH EVERY EIGHT HOURS AS NEEDED FOR PAIN Oral , Notes: PHILLIPS    Nystop(Nystatin) 100000 UNIT/GM Powder 1 application as needed for rash Externally Twice a day, Notes: ROSS PRN    Senna S(Senna) 8.6-50 MG Tablet 1 tablet Orally twice a day, Notes: Listed in Epic    Medication List reviewed and reconciled with the patient          Past Medical History       Central adrenal insufficiency.        MRSA.        Eczema- Dr. Ronnald Ramp with Derm.        Scoliosis.        Hypertension Dr. Harrington Challenger.        Hypothyroidism- Dr. Buddy Duty.        Depression/anxiety.        Hx: Breast Cancer in left breast Paget's disease 04/2009.        Chronic pain syndrome secondary to degenerative disk disease with spinal cord stimulator- Dr. Hardin Negus.        Idiopathic edema.        Migraines Dr.Dohmeier.        plasma cell dyscrasia (Dr. Marko Plume).        ARF 12/10, followed by Dr. Posey Pronto with renal.  Minimally abnormal kappa-to-lambda free light chain ratio of unclear significance per Dr. Marko Plume 2011.        Recurrent shingles.        Asthma, now following with pulmonology group at [facility].        AODM, ?secondary to steroid use?Marland Kitchen        Obstructive sleep apnea per sleep study in 12/2011 done at [facility].        Bowel inpaction.        Anxiety.        Ulcers.        Sliding hernia.        History of MS- told she had it in [place] many years ago (relapsing remitting).        Kidney stones.       Surgical History  Cholecystectomy   cervical diskectomy   Left Breast Mastectomy 08/03/2009  appendectomy   TAH   Insertion of spinal cord stimulator on 01/21/2004 with revision in April 2005, both by Dr. Durene Fruits   salivary gland incision   EGD 07/10/2011,10/2016  egd/colon 11/01/2007,04/11/13  barium swallow 2015  esophageal stretch 2015  Right radical mastectomy 09/2014  egd w. dil 08/23/17      Family History  Father: deceased, tuberculosis, diagnosed  with CHF (congestive heart failure)  Mother: deceased, CVA  Paternal [place] Father: deceased, tuberculosis  Sister 1: alive, colon polyps  2daughter(s) .   Negative family colon cancer, or liver disease Negative family colon cancer, or liver disease.      Social History  General:   Tobacco use       cigarettes:  Never smoked     Tobacco history last updated  01/12/2020     Vaping  No EXPOSURE TO PASSIVE SMOKE: both parents smoked.  Alcohol: not lately.  Marital Status: married.  OCCUPATION: unemployed.  no Tobacco Exposure.     Allergies  IVP Dye: anaphylaxis - Allergy  Reglan: hyper activity - Side Effects  Lamisil: anaphylaxis - Allergy  Lisinopril: coughing - Side Effects  Phenergan: anxiety - Side Effects  Sulfa Drugs: Hallucination - Side Effects  Dilaudid: Vomiting and Migraine - Allergy  Vancomycin HCl: swelling - Allergy  Compazine: anxious, shaky - Side Effects  Biaxin: anaphylaxis - Side Effects  Amoxicillin: diarrhea - Side Effects  Ambien: shaky - Side Effects  Mucinex: shaky - Side Effects  Aldara: Sick - Side Effects  Fosamax: esophageal irritation - Side Effects  Carafate: N&V - Side Effects  Trifluoperazine HCl: Hallucination - Side Effects  Bactrim: anaphylaxis  Bactrim DS: hallucinations - Allergy  Tizanidine HCl: not effective - Lack of Therapeutic Effect  Gabapentin (Once-Daily): anaphylaxis - Allergy  Verapamil HCl: Circulation issues - Side Effects  Novocain: anaphylaxis, jerky, shaky  Tussionex Pennkinetic ER: bad headaches      Hospitalization/Major Diagnostic Procedure  dehydration 12/14-12/16/2010  Chest Pain 06/2011  addison disease 10/2012  Not in the past year 04/2014  Spinal fracture 05/2014  concusion fell 04/2016  not in the past yr 08/2017  none in the past yr 06/2019      Review of Systems  per HPI.    Assessment plan: Dysphagia Nausea Unintentional weight loss Plan diagnostic EGD with balloon dilation if needed, Botox  injection and biopsies.

## 2020-04-23 ENCOUNTER — Encounter (HOSPITAL_COMMUNITY)
Admission: RE | Disposition: A | Discharge status: Home / Self Care | Payer: Self-pay | Source: Ambulatory Visit | Attending: Gastroenterology

## 2020-04-23 ENCOUNTER — Ambulatory Visit (HOSPITAL_COMMUNITY): Payer: Medicare Other | Admitting: Anesthesiology

## 2020-04-23 ENCOUNTER — Ambulatory Visit (HOSPITAL_COMMUNITY)
Admission: RE | Admit: 2020-04-23 | Discharge: 2020-04-23 | Disposition: A | Discharge status: Home / Self Care | Payer: Medicare Other | Source: Ambulatory Visit | Attending: Gastroenterology | Admitting: Gastroenterology

## 2020-04-23 ENCOUNTER — Other Ambulatory Visit: Payer: Self-pay

## 2020-04-23 ENCOUNTER — Encounter (HOSPITAL_COMMUNITY): Payer: Self-pay | Admitting: Gastroenterology

## 2020-04-23 DIAGNOSIS — N179 Acute kidney failure, unspecified: Secondary | ICD-10-CM | POA: Diagnosis not present

## 2020-04-23 DIAGNOSIS — K209 Esophagitis, unspecified without bleeding: Secondary | ICD-10-CM | POA: Diagnosis not present

## 2020-04-23 DIAGNOSIS — Z882 Allergy status to sulfonamides status: Secondary | ICD-10-CM | POA: Insufficient documentation

## 2020-04-23 DIAGNOSIS — I10 Essential (primary) hypertension: Secondary | ICD-10-CM | POA: Diagnosis not present

## 2020-04-23 DIAGNOSIS — Z969 Presence of functional implant, unspecified: Secondary | ICD-10-CM | POA: Insufficient documentation

## 2020-04-23 DIAGNOSIS — E119 Type 2 diabetes mellitus without complications: Secondary | ICD-10-CM | POA: Diagnosis not present

## 2020-04-23 DIAGNOSIS — R1310 Dysphagia, unspecified: Secondary | ICD-10-CM | POA: Insufficient documentation

## 2020-04-23 DIAGNOSIS — Z881 Allergy status to other antibiotic agents status: Secondary | ICD-10-CM | POA: Diagnosis not present

## 2020-04-23 DIAGNOSIS — G4733 Obstructive sleep apnea (adult) (pediatric): Secondary | ICD-10-CM | POA: Insufficient documentation

## 2020-04-23 DIAGNOSIS — K317 Polyp of stomach and duodenum: Secondary | ICD-10-CM | POA: Diagnosis not present

## 2020-04-23 DIAGNOSIS — Z7952 Long term (current) use of systemic steroids: Secondary | ICD-10-CM | POA: Insufficient documentation

## 2020-04-23 DIAGNOSIS — Z885 Allergy status to narcotic agent status: Secondary | ICD-10-CM | POA: Diagnosis not present

## 2020-04-23 DIAGNOSIS — Z8249 Family history of ischemic heart disease and other diseases of the circulatory system: Secondary | ICD-10-CM | POA: Insufficient documentation

## 2020-04-23 DIAGNOSIS — Z79899 Other long term (current) drug therapy: Secondary | ICD-10-CM | POA: Diagnosis not present

## 2020-04-23 DIAGNOSIS — E039 Hypothyroidism, unspecified: Secondary | ICD-10-CM | POA: Insufficient documentation

## 2020-04-23 DIAGNOSIS — R634 Abnormal weight loss: Secondary | ICD-10-CM | POA: Diagnosis not present

## 2020-04-23 DIAGNOSIS — Z880 Allergy status to penicillin: Secondary | ICD-10-CM | POA: Insufficient documentation

## 2020-04-23 DIAGNOSIS — Z79890 Hormone replacement therapy (postmenopausal): Secondary | ICD-10-CM | POA: Insufficient documentation

## 2020-04-23 DIAGNOSIS — F329 Major depressive disorder, single episode, unspecified: Secondary | ICD-10-CM | POA: Insufficient documentation

## 2020-04-23 DIAGNOSIS — Z8614 Personal history of Methicillin resistant Staphylococcus aureus infection: Secondary | ICD-10-CM | POA: Insufficient documentation

## 2020-04-23 DIAGNOSIS — Z883 Allergy status to other anti-infective agents status: Secondary | ICD-10-CM | POA: Diagnosis not present

## 2020-04-23 DIAGNOSIS — K30 Functional dyspepsia: Secondary | ICD-10-CM | POA: Diagnosis not present

## 2020-04-23 DIAGNOSIS — K228 Other specified diseases of esophagus: Secondary | ICD-10-CM | POA: Diagnosis not present

## 2020-04-23 DIAGNOSIS — F419 Anxiety disorder, unspecified: Secondary | ICD-10-CM | POA: Insufficient documentation

## 2020-04-23 DIAGNOSIS — K3189 Other diseases of stomach and duodenum: Secondary | ICD-10-CM | POA: Diagnosis not present

## 2020-04-23 DIAGNOSIS — E2740 Unspecified adrenocortical insufficiency: Secondary | ICD-10-CM | POA: Diagnosis not present

## 2020-04-23 DIAGNOSIS — Z888 Allergy status to other drugs, medicaments and biological substances status: Secondary | ICD-10-CM | POA: Insufficient documentation

## 2020-04-23 DIAGNOSIS — Z9049 Acquired absence of other specified parts of digestive tract: Secondary | ICD-10-CM | POA: Insufficient documentation

## 2020-04-23 DIAGNOSIS — G894 Chronic pain syndrome: Secondary | ICD-10-CM | POA: Diagnosis not present

## 2020-04-23 DIAGNOSIS — Z8619 Personal history of other infectious and parasitic diseases: Secondary | ICD-10-CM | POA: Insufficient documentation

## 2020-04-23 DIAGNOSIS — J45909 Unspecified asthma, uncomplicated: Secondary | ICD-10-CM | POA: Diagnosis not present

## 2020-04-23 DIAGNOSIS — M419 Scoliosis, unspecified: Secondary | ICD-10-CM | POA: Diagnosis not present

## 2020-04-23 DIAGNOSIS — K219 Gastro-esophageal reflux disease without esophagitis: Secondary | ICD-10-CM | POA: Diagnosis not present

## 2020-04-23 DIAGNOSIS — Z9013 Acquired absence of bilateral breasts and nipples: Secondary | ICD-10-CM | POA: Insufficient documentation

## 2020-04-23 HISTORY — PX: BOTOX INJECTION: SHX5754

## 2020-04-23 HISTORY — PX: BALLOON DILATION: SHX5330

## 2020-04-23 HISTORY — PX: ESOPHAGOGASTRODUODENOSCOPY (EGD) WITH PROPOFOL: SHX5813

## 2020-04-23 HISTORY — PX: BIOPSY: SHX5522

## 2020-04-23 SURGERY — ESOPHAGOGASTRODUODENOSCOPY (EGD) WITH PROPOFOL
Anesthesia: Monitor Anesthesia Care

## 2020-04-23 MED ORDER — PROPOFOL 500 MG/50ML IV EMUL
INTRAVENOUS | Status: DC | PRN
  Administered 2020-04-23: 125 ug/kg/min via INTRAVENOUS

## 2020-04-23 MED ORDER — HYDROCORTISONE NA SUCCINATE PF 100 MG IJ SOLR
INTRAMUSCULAR | Status: AC
  Filled 2020-04-23: qty 2

## 2020-04-23 MED ORDER — ONABOTULINUMTOXINA 100 UNITS IJ SOLR
INTRAMUSCULAR | Status: AC
  Filled 2020-04-23: qty 100

## 2020-04-23 MED ORDER — SODIUM CHLORIDE (PF) 0.9 % IJ SOLN
INTRAMUSCULAR | Status: DC | PRN
  Administered 2020-04-23: 4 mL via SUBMUCOSAL

## 2020-04-23 MED ORDER — HYDROCORTISONE NA SUCCINATE PF 100 MG IJ SOLR
INTRAMUSCULAR | Status: DC | PRN
  Administered 2020-04-23: 100 mg via INTRAVENOUS

## 2020-04-23 MED ORDER — LACTATED RINGERS IV SOLN: INTRAVENOUS | Status: DC

## 2020-04-23 MED ORDER — PROPOFOL 10 MG/ML IV BOLUS
INTRAVENOUS | Status: DC | PRN
  Administered 2020-04-23 (×3): 20 mg via INTRAVENOUS

## 2020-04-23 MED ORDER — ONDANSETRON HCL 4 MG/2ML IJ SOLN
INTRAMUSCULAR | Status: DC | PRN
  Administered 2020-04-23: 4 mg via INTRAVENOUS

## 2020-04-23 MED ORDER — SODIUM CHLORIDE (PF) 0.9 % IJ SOLN
INTRAMUSCULAR | Status: AC
  Filled 2020-04-23: qty 10

## 2020-04-23 MED ORDER — SODIUM CHLORIDE 0.9 % IV SOLN: INTRAVENOUS | Status: DC

## 2020-04-23 SURGICAL SUPPLY — 15 items

## 2020-04-23 NOTE — Brief Op Note (Signed)
04/23/2020  9:59 AM  PATIENT:  [name]  69 y.o. female  PRE-OPERATIVE DIAGNOSIS:  Dysphagia  POST-OPERATIVE DIAGNOSIS:  * No post-op diagnosis entered *  PROCEDURE:  Procedure(s): ESOPHAGOGASTRODUODENOSCOPY (EGD) WITH PROPOFOL (N/A) BOTOX INJECTION (N/A) BALLOON DILATION (N/A) BIOPSY  SURGEON:  Surgeon(s) and Role:    Ronnette Juniper, MD - Primary  PHYSICIAN ASSISTANT:   ASSISTANTS: Lilli Few, Marguerita Merles, Tech  ANESTHESIA:   MAC  EBL: Minimal  BLOOD ADMINISTERED:none  DRAINS: none   LOCAL MEDICATIONS USED:  NONE  SPECIMEN:  Biopsy / Limited Resection  DISPOSITION OF SPECIMEN:  PATHOLOGY  COUNTS:  YES  TOURNIQUET:  * No tourniquets in log *  DICTATION: .Dragon Dictation  PLAN OF CARE: Discharge to home after PACU  PATIENT DISPOSITION:  PACU - hemodynamically stable.   Delay start of Pharmacological VTE agent (>24hrs) due to surgical blood loss or risk of bleeding: no

## 2020-04-23 NOTE — Transfer of Care (Signed)
Immediate Anesthesia Transfer of Care Note  Patient: Daisy [name]  Procedure(s) Performed: ESOPHAGOGASTRODUODENOSCOPY (EGD) WITH PROPOFOL (N/A ) BOTOX INJECTION (N/A ) BALLOON DILATION (N/A ) BIOPSY  Patient Location: PACU  Anesthesia Type:MAC  Level of Consciousness: sedated  Airway & Oxygen Therapy: Patient Spontanous Breathing and Patient connected to face mask oxygen  Post-op Assessment: Report given to RN and Post -op Vital signs reviewed and stable  Post vital signs: Reviewed and stable  Last Vitals:  Vitals Value Taken Time  BP 157/88 04/23/20 0953  Temp    Pulse    Resp 16 04/23/20 0954  SpO2    Vitals shown include unvalidated device data.  Last Pain:  Vitals:   04/23/20 0817  TempSrc: Oral  PainSc: 0-No pain         Complications: No apparent anesthesia complications

## 2020-04-23 NOTE — Interval H&P Note (Signed)
History and Physical Interval Note: 68/female with dysphagia, weight loss, hypertensive LES not typical of achalasia, improvement with prior Botox injections for an Egd with botox injection and possible balloon dilation.  04/23/2020 8:12 AM  Daisy [name]  has presented today for EGD with botox and possible balloon dilation, with the diagnosis of Dysphagia.  The various methods of treatment have been discussed with the patient and family. After consideration of risks, benefits and other options for treatment, the patient has consented to  Procedure(s): ESOPHAGOGASTRODUODENOSCOPY (EGD) WITH PROPOFOL (N/A) BOTOX INJECTION (N/A) as a surgical intervention.  The patient's history has been reviewed, patient examined, no change in status, stable for surgery.  I have reviewed the patient's chart and labs.  Questions were answered to the patient's satisfaction.     Ronnette Juniper

## 2020-04-23 NOTE — Anesthesia Preprocedure Evaluation (Addendum)
Anesthesia Evaluation  Patient identified by MRN, date of birth, ID band Patient awake    Reviewed: Allergy & Precautions, NPO status , Patient's Chart, lab work & pertinent test results  History of Anesthesia Complications (+) PONV and POST - OP SPINAL HEADACHE  Airway Mallampati: II  TM Distance: >3 FB Neck ROM: Full    Dental  (+) Missing, Dental Advisory Given   Pulmonary sleep apnea (does not use CPAP) , COPD,  04/20/2020 SARS coronavirus neg   breath sounds clear to auscultation       Cardiovascular hypertension, Pt. on medications (-) angina Rhythm:Regular Rate:Normal  '20 ECHO: EF 60-65%, valves OK   Neuro/Psych  Headaches, Anxiety MS Chronic pain: spinal cord stimulator    GI/Hepatic GERD  Poorly Controlled,(+) Hepatitis -  Endo/Other  Hypothyroidism Addison's disease: did not take her prednisone today  Renal/GU negative Renal ROS     Musculoskeletal  (+) Arthritis , Fibromyalgia -, narcotic dependent  Abdominal   Peds  Hematology   Anesthesia Other Findings Breast cancer  Reproductive/Obstetrics                            Anesthesia Physical Anesthesia Plan  ASA: III  Anesthesia Plan: MAC   Post-op Pain Management:    Induction:   PONV Risk Score and Plan: 3 and Ondansetron  Airway Management Planned: Natural Airway and Nasal Cannula  Additional Equipment: None  Intra-op Plan:   Post-operative Plan:   Informed Consent: I have reviewed the patients History and Physical, chart, labs and discussed the procedure including the risks, benefits and alternatives for the proposed anesthesia with the patient or authorized representative who has indicated his/her understanding and acceptance.     Dental advisory given  Plan Discussed with: CRNA and Surgeon  Anesthesia Plan Comments: (Stress dose hydrocortisone)        Anesthesia Quick Evaluation

## 2020-04-23 NOTE — Anesthesia Postprocedure Evaluation (Signed)
Anesthesia Post Note  Patient: Daisy [name]  Procedure(s) Performed: ESOPHAGOGASTRODUODENOSCOPY (EGD) WITH PROPOFOL (N/A ) BOTOX INJECTION (N/A ) BALLOON DILATION (N/A ) BIOPSY     Patient location during evaluation: Endoscopy Anesthesia Type: MAC Level of consciousness: awake and alert, oriented and patient cooperative Pain management: pain level controlled Vital Signs Assessment: post-procedure vital signs reviewed and stable Respiratory status: spontaneous breathing, nonlabored ventilation and respiratory function stable Cardiovascular status: blood pressure returned to baseline and stable Postop Assessment: no apparent nausea or vomiting Anesthetic complications: no    Last Vitals:  Vitals:   04/23/20 1002 04/23/20 1010  BP: (!) 159/75 (!) 160/75  Pulse: 81 81  Resp: 14 (!) 24  Temp: (!) 35.6 C   SpO2: 98% 96%    Last Pain:  Vitals:   04/23/20 1010  TempSrc:   PainSc: 0-No pain                 [name],E. Pooja Camuso

## 2020-04-23 NOTE — Op Note (Signed)
[facility] Patient Name: Daisy [name] Procedure Date: 04/23/2020 MRN: [id number] Attending MD: Ronnette Juniper , MD Date of Birth: Jun 03, 1951 CSN: [id number] Age: 69 Admit Type: Outpatient Procedure:                Upper GI endoscopy Indications:              Dysphagia, Weight loss, esophageal manometry showed                            hypertensive LES not typical of achalasia Providers:                Ronnette Juniper, MD, Cleda Daub, RN, Marguerita Merles,                            Technician Referring MD:             Claris Pong Ross,MD Medicines:                Monitored Anesthesia Care Complications:            No immediate complications. Estimated blood loss:                            Minimal. Estimated Blood Loss:     Estimated blood loss was minimal. Procedure:                Pre-Anesthesia Assessment:                           - Prior to the procedure, a History and Physical                            was performed, and patient medications and                            allergies were reviewed. The patient's tolerance of                            previous anesthesia was also reviewed. The risks                            and benefits of the procedure and the sedation                            options and risks were discussed with the patient.                            All questions were answered, and informed consent                            was obtained. Prior Anticoagulants: The patient has                            taken no previous anticoagulant or antiplatelet  agents. ASA Grade Assessment: III - A patient with                            severe systemic disease. After reviewing the risks                            and benefits, the patient was deemed in                            satisfactory condition to undergo the procedure.                           After obtaining informed consent, the endoscope was       passed under direct vision. Throughout the                            procedure, the patient's blood pressure, pulse, and                            oxygen saturations were monitored continuously. The                            [id number]) was introduced through the                            mouth, and advanced to the second part of duodenum.                            The upper GI endoscopy was accomplished without                            difficulty. The patient tolerated the procedure                            well. Scope In: Scope Out: Findings:      The upper third of the esophagus and middle third of the esophagus were       normal.      Localized severe mucosal changes characterized by altered texture were       found in the lower third of the esophagus, compatible with esopahgeal       dissecans. Biopsies were taken with a cold forceps for histology.      There was moderate resistance noted during passage of the scope into the       gastric cavity and a small amount of bleeding noted. Area was       successfully injected with 100 units botulinum toxin, 25 units/1 ml, in       four quadrants of the distal esophagus, 1 cm above the GE junction.      A few small sessile polyps were found in the gastric body.      The cardia and gastric fundus were normal on retroflexion.      A deformity was found at the pylorus. A TTS dilator was passed through       the scope. Dilation with an 18-19-20 mm pyloric balloon dilator was  performed,at 20 mm for a minute. The dilation site was examined       following endoscope reinsertion and showed moderate mucosal disruption.      The examined duodenum was normal. Impression:               - Normal upper third of esophagus and middle third                            of esophagus.                           - Texture changed mucosa in the esophagus.                            Biopsied. BOTOX injection.                           -  A few gastric polyps.                           - Acquired deformity in the pylorus. Dilated.                           - Normal examined duodenum. Moderate Sedation:      Patient did not receive moderate sedation for this procedure, but       instead received monitored anesthesia care. Recommendation:           - Patient has a contact number available for                            emergencies. The signs and symptoms of potential                            delayed complications were discussed with the                            patient. Return to normal activities tomorrow.                            Written discharge instructions were provided to the                            patient.                           - Resume regular diet.                           - Continue present medications.                           - Await pathology results. Procedure Code(s):        --- Professional ---                           [id number], Esophagogastroduodenoscopy, flexible,  transoral; with dilation of gastric/duodenal                            stricture(s) (eg, balloon, bougie)                           43239, 59, Esophagogastroduodenoscopy, flexible,                            transoral; with biopsy, single or multiple                           43236, 59, Esophagogastroduodenoscopy, flexible,                            transoral; with directed submucosal injection(s),                            any substance Diagnosis Code(s):        --- Professional ---                           K22.8, Other specified diseases of esophagus                           K31.7, Polyp of stomach and duodenum                           K31.89, Other diseases of stomach and duodenum                           R13.10, Dysphagia, unspecified                           R63.4, Abnormal weight loss CPT copyright 2019 American Medical Association. All rights reserved. The codes documented in this report  are preliminary and upon coder review may  be revised to meet current compliance requirements. Ronnette Juniper, MD 04/23/2020 9:59:09 AM This report has been signed electronically. Number of Addenda: 0

## 2020-04-23 NOTE — Discharge Instructions (Signed)

## 2020-04-26 ENCOUNTER — Encounter: Payer: Self-pay | Admitting: *Deleted

## 2020-04-26 LAB — SURGICAL PATHOLOGY

## 2020-04-27 DIAGNOSIS — Z853 Personal history of malignant neoplasm of breast: Secondary | ICD-10-CM | POA: Diagnosis not present

## 2020-04-27 DIAGNOSIS — I10 Essential (primary) hypertension: Secondary | ICD-10-CM | POA: Diagnosis not present

## 2020-04-27 DIAGNOSIS — R1110 Vomiting, unspecified: Secondary | ICD-10-CM | POA: Diagnosis not present

## 2020-04-27 DIAGNOSIS — R42 Dizziness and giddiness: Secondary | ICD-10-CM | POA: Diagnosis not present

## 2020-04-27 DIAGNOSIS — T887XXA Unspecified adverse effect of drug or medicament, initial encounter: Secondary | ICD-10-CM | POA: Diagnosis not present

## 2020-04-27 DIAGNOSIS — J45909 Unspecified asthma, uncomplicated: Secondary | ICD-10-CM | POA: Diagnosis not present

## 2020-04-27 DIAGNOSIS — E039 Hypothyroidism, unspecified: Secondary | ICD-10-CM | POA: Diagnosis not present

## 2020-04-27 DIAGNOSIS — M810 Age-related osteoporosis without current pathological fracture: Secondary | ICD-10-CM | POA: Diagnosis not present

## 2020-05-05 DIAGNOSIS — J019 Acute sinusitis, unspecified: Secondary | ICD-10-CM | POA: Diagnosis not present

## 2020-05-26 DIAGNOSIS — J45909 Unspecified asthma, uncomplicated: Secondary | ICD-10-CM | POA: Diagnosis not present

## 2020-05-26 DIAGNOSIS — E039 Hypothyroidism, unspecified: Secondary | ICD-10-CM | POA: Diagnosis not present

## 2020-05-26 DIAGNOSIS — I10 Essential (primary) hypertension: Secondary | ICD-10-CM | POA: Diagnosis not present

## 2020-05-26 DIAGNOSIS — Z853 Personal history of malignant neoplasm of breast: Secondary | ICD-10-CM | POA: Diagnosis not present

## 2020-05-26 DIAGNOSIS — M810 Age-related osteoporosis without current pathological fracture: Secondary | ICD-10-CM | POA: Diagnosis not present

## 2020-05-28 DIAGNOSIS — N200 Calculus of kidney: Secondary | ICD-10-CM | POA: Diagnosis not present

## 2020-05-28 DIAGNOSIS — N3020 Other chronic cystitis without hematuria: Secondary | ICD-10-CM | POA: Diagnosis not present

## 2020-06-09 DIAGNOSIS — M810 Age-related osteoporosis without current pathological fracture: Secondary | ICD-10-CM | POA: Diagnosis not present

## 2020-06-09 DIAGNOSIS — Z0000 Encounter for general adult medical examination without abnormal findings: Secondary | ICD-10-CM | POA: Diagnosis not present

## 2020-06-09 DIAGNOSIS — Z13220 Encounter for screening for lipoid disorders: Secondary | ICD-10-CM | POA: Diagnosis not present

## 2020-06-09 DIAGNOSIS — I10 Essential (primary) hypertension: Secondary | ICD-10-CM | POA: Diagnosis not present

## 2020-06-09 DIAGNOSIS — K219 Gastro-esophageal reflux disease without esophagitis: Secondary | ICD-10-CM | POA: Diagnosis not present

## 2020-06-09 DIAGNOSIS — R7611 Nonspecific reaction to tuberculin skin test without active tuberculosis: Secondary | ICD-10-CM | POA: Diagnosis not present

## 2020-06-09 DIAGNOSIS — B029 Zoster without complications: Secondary | ICD-10-CM | POA: Diagnosis not present

## 2020-06-09 DIAGNOSIS — E538 Deficiency of other specified B group vitamins: Secondary | ICD-10-CM | POA: Diagnosis not present

## 2020-06-09 DIAGNOSIS — G35 Multiple sclerosis: Secondary | ICD-10-CM | POA: Diagnosis not present

## 2020-06-17 ENCOUNTER — Other Ambulatory Visit: Payer: Self-pay | Admitting: Family Medicine

## 2020-06-17 DIAGNOSIS — M810 Age-related osteoporosis without current pathological fracture: Secondary | ICD-10-CM

## 2020-07-12 DIAGNOSIS — K219 Gastro-esophageal reflux disease without esophagitis: Secondary | ICD-10-CM | POA: Diagnosis not present

## 2020-07-12 DIAGNOSIS — I10 Essential (primary) hypertension: Secondary | ICD-10-CM | POA: Diagnosis not present

## 2020-07-12 DIAGNOSIS — Z853 Personal history of malignant neoplasm of breast: Secondary | ICD-10-CM | POA: Diagnosis not present

## 2020-07-12 DIAGNOSIS — E039 Hypothyroidism, unspecified: Secondary | ICD-10-CM | POA: Diagnosis not present

## 2020-07-12 DIAGNOSIS — M810 Age-related osteoporosis without current pathological fracture: Secondary | ICD-10-CM | POA: Diagnosis not present

## 2020-07-12 DIAGNOSIS — E230 Hypopituitarism: Secondary | ICD-10-CM | POA: Diagnosis not present

## 2020-07-12 DIAGNOSIS — J45909 Unspecified asthma, uncomplicated: Secondary | ICD-10-CM | POA: Diagnosis not present

## 2020-07-26 DIAGNOSIS — M41115 Juvenile idiopathic scoliosis, thoracolumbar region: Secondary | ICD-10-CM | POA: Diagnosis not present

## 2020-07-26 DIAGNOSIS — Z79891 Long term (current) use of opiate analgesic: Secondary | ICD-10-CM | POA: Diagnosis not present

## 2020-07-26 DIAGNOSIS — G894 Chronic pain syndrome: Secondary | ICD-10-CM | POA: Diagnosis not present

## 2020-07-26 DIAGNOSIS — M4726 Other spondylosis with radiculopathy, lumbar region: Secondary | ICD-10-CM | POA: Diagnosis not present

## 2020-07-28 DIAGNOSIS — H40033 Anatomical narrow angle, bilateral: Secondary | ICD-10-CM | POA: Diagnosis not present

## 2020-07-28 DIAGNOSIS — H0102A Squamous blepharitis right eye, upper and lower eyelids: Secondary | ICD-10-CM | POA: Diagnosis not present

## 2020-07-28 DIAGNOSIS — H16223 Keratoconjunctivitis sicca, not specified as Sjogren's, bilateral: Secondary | ICD-10-CM | POA: Diagnosis not present

## 2020-07-28 DIAGNOSIS — G35 Multiple sclerosis: Secondary | ICD-10-CM | POA: Diagnosis not present

## 2020-07-28 DIAGNOSIS — H0102B Squamous blepharitis left eye, upper and lower eyelids: Secondary | ICD-10-CM | POA: Diagnosis not present

## 2020-07-28 DIAGNOSIS — R51 Headache with orthostatic component, not elsewhere classified: Secondary | ICD-10-CM | POA: Diagnosis not present

## 2020-07-28 DIAGNOSIS — H16121 Filamentary keratitis, right eye: Secondary | ICD-10-CM | POA: Diagnosis not present

## 2020-09-02 DIAGNOSIS — Z853 Personal history of malignant neoplasm of breast: Secondary | ICD-10-CM | POA: Diagnosis not present

## 2020-09-02 DIAGNOSIS — J45909 Unspecified asthma, uncomplicated: Secondary | ICD-10-CM | POA: Diagnosis not present

## 2020-09-02 DIAGNOSIS — K219 Gastro-esophageal reflux disease without esophagitis: Secondary | ICD-10-CM | POA: Diagnosis not present

## 2020-09-02 DIAGNOSIS — E039 Hypothyroidism, unspecified: Secondary | ICD-10-CM | POA: Diagnosis not present

## 2020-09-02 DIAGNOSIS — I10 Essential (primary) hypertension: Secondary | ICD-10-CM | POA: Diagnosis not present

## 2020-09-02 DIAGNOSIS — M810 Age-related osteoporosis without current pathological fracture: Secondary | ICD-10-CM | POA: Diagnosis not present

## 2020-09-14 DIAGNOSIS — Z853 Personal history of malignant neoplasm of breast: Secondary | ICD-10-CM | POA: Diagnosis not present

## 2020-09-14 DIAGNOSIS — E039 Hypothyroidism, unspecified: Secondary | ICD-10-CM | POA: Diagnosis not present

## 2020-09-14 DIAGNOSIS — M810 Age-related osteoporosis without current pathological fracture: Secondary | ICD-10-CM | POA: Diagnosis not present

## 2020-09-14 DIAGNOSIS — J45909 Unspecified asthma, uncomplicated: Secondary | ICD-10-CM | POA: Diagnosis not present

## 2020-09-14 DIAGNOSIS — K219 Gastro-esophageal reflux disease without esophagitis: Secondary | ICD-10-CM | POA: Diagnosis not present

## 2020-09-14 DIAGNOSIS — I10 Essential (primary) hypertension: Secondary | ICD-10-CM | POA: Diagnosis not present

## 2020-09-16 ENCOUNTER — Other Ambulatory Visit: Payer: Medicare Other

## 2020-09-28 DIAGNOSIS — G894 Chronic pain syndrome: Secondary | ICD-10-CM | POA: Diagnosis not present

## 2020-09-28 DIAGNOSIS — M41115 Juvenile idiopathic scoliosis, thoracolumbar region: Secondary | ICD-10-CM | POA: Diagnosis not present

## 2020-09-28 DIAGNOSIS — M4726 Other spondylosis with radiculopathy, lumbar region: Secondary | ICD-10-CM | POA: Diagnosis not present

## 2020-09-28 DIAGNOSIS — Z79891 Long term (current) use of opiate analgesic: Secondary | ICD-10-CM | POA: Diagnosis not present

## 2020-10-06 DIAGNOSIS — Z853 Personal history of malignant neoplasm of breast: Secondary | ICD-10-CM | POA: Diagnosis not present

## 2020-10-06 DIAGNOSIS — J45909 Unspecified asthma, uncomplicated: Secondary | ICD-10-CM | POA: Diagnosis not present

## 2020-10-06 DIAGNOSIS — E039 Hypothyroidism, unspecified: Secondary | ICD-10-CM | POA: Diagnosis not present

## 2020-10-06 DIAGNOSIS — I10 Essential (primary) hypertension: Secondary | ICD-10-CM | POA: Diagnosis not present

## 2020-10-06 DIAGNOSIS — K219 Gastro-esophageal reflux disease without esophagitis: Secondary | ICD-10-CM | POA: Diagnosis not present

## 2020-10-06 DIAGNOSIS — M810 Age-related osteoporosis without current pathological fracture: Secondary | ICD-10-CM | POA: Diagnosis not present

## 2020-10-07 DIAGNOSIS — R0789 Other chest pain: Secondary | ICD-10-CM | POA: Diagnosis not present

## 2020-10-07 DIAGNOSIS — J3489 Other specified disorders of nose and nasal sinuses: Secondary | ICD-10-CM | POA: Diagnosis not present

## 2020-10-07 DIAGNOSIS — R6883 Chills (without fever): Secondary | ICD-10-CM | POA: Diagnosis not present

## 2020-10-07 DIAGNOSIS — R509 Fever, unspecified: Secondary | ICD-10-CM | POA: Diagnosis not present

## 2020-10-07 DIAGNOSIS — R42 Dizziness and giddiness: Secondary | ICD-10-CM | POA: Diagnosis not present

## 2020-10-07 DIAGNOSIS — R112 Nausea with vomiting, unspecified: Secondary | ICD-10-CM | POA: Diagnosis not present

## 2020-10-07 DIAGNOSIS — R197 Diarrhea, unspecified: Secondary | ICD-10-CM | POA: Diagnosis not present

## 2020-10-07 DIAGNOSIS — J029 Acute pharyngitis, unspecified: Secondary | ICD-10-CM | POA: Diagnosis not present

## 2020-10-07 DIAGNOSIS — R062 Wheezing: Secondary | ICD-10-CM | POA: Diagnosis not present

## 2020-10-07 DIAGNOSIS — R059 Cough, unspecified: Secondary | ICD-10-CM | POA: Diagnosis not present

## 2020-10-08 DIAGNOSIS — Z20822 Contact with and (suspected) exposure to covid-19: Secondary | ICD-10-CM | POA: Diagnosis not present

## 2020-10-08 DIAGNOSIS — Z03818 Encounter for observation for suspected exposure to other biological agents ruled out: Secondary | ICD-10-CM | POA: Diagnosis not present

## 2020-10-08 DIAGNOSIS — J029 Acute pharyngitis, unspecified: Secondary | ICD-10-CM | POA: Diagnosis not present

## 2020-10-08 DIAGNOSIS — R059 Cough, unspecified: Secondary | ICD-10-CM | POA: Diagnosis not present

## 2020-12-02 DIAGNOSIS — E039 Hypothyroidism, unspecified: Secondary | ICD-10-CM | POA: Diagnosis not present

## 2020-12-02 DIAGNOSIS — Z853 Personal history of malignant neoplasm of breast: Secondary | ICD-10-CM | POA: Diagnosis not present

## 2020-12-02 DIAGNOSIS — J45909 Unspecified asthma, uncomplicated: Secondary | ICD-10-CM | POA: Diagnosis not present

## 2020-12-02 DIAGNOSIS — I10 Essential (primary) hypertension: Secondary | ICD-10-CM | POA: Diagnosis not present

## 2020-12-02 DIAGNOSIS — K219 Gastro-esophageal reflux disease without esophagitis: Secondary | ICD-10-CM | POA: Diagnosis not present

## 2020-12-02 DIAGNOSIS — M810 Age-related osteoporosis without current pathological fracture: Secondary | ICD-10-CM | POA: Diagnosis not present

## 2020-12-15 DIAGNOSIS — E039 Hypothyroidism, unspecified: Secondary | ICD-10-CM | POA: Diagnosis not present

## 2020-12-15 DIAGNOSIS — J45909 Unspecified asthma, uncomplicated: Secondary | ICD-10-CM | POA: Diagnosis not present

## 2020-12-15 DIAGNOSIS — I10 Essential (primary) hypertension: Secondary | ICD-10-CM | POA: Diagnosis not present

## 2020-12-15 DIAGNOSIS — M810 Age-related osteoporosis without current pathological fracture: Secondary | ICD-10-CM | POA: Diagnosis not present

## 2020-12-15 DIAGNOSIS — Z853 Personal history of malignant neoplasm of breast: Secondary | ICD-10-CM | POA: Diagnosis not present

## 2020-12-15 DIAGNOSIS — K219 Gastro-esophageal reflux disease without esophagitis: Secondary | ICD-10-CM | POA: Diagnosis not present

## 2020-12-22 DIAGNOSIS — M41115 Juvenile idiopathic scoliosis, thoracolumbar region: Secondary | ICD-10-CM | POA: Diagnosis not present

## 2020-12-22 DIAGNOSIS — Z79891 Long term (current) use of opiate analgesic: Secondary | ICD-10-CM | POA: Diagnosis not present

## 2020-12-22 DIAGNOSIS — H5789 Other specified disorders of eye and adnexa: Secondary | ICD-10-CM | POA: Diagnosis not present

## 2020-12-22 DIAGNOSIS — G894 Chronic pain syndrome: Secondary | ICD-10-CM | POA: Diagnosis not present

## 2020-12-22 DIAGNOSIS — M4726 Other spondylosis with radiculopathy, lumbar region: Secondary | ICD-10-CM | POA: Diagnosis not present

## 2020-12-27 ENCOUNTER — Other Ambulatory Visit: Payer: Medicare Other

## 2020-12-29 ENCOUNTER — Other Ambulatory Visit: Payer: Self-pay | Admitting: Gastroenterology

## 2021-01-13 DIAGNOSIS — E230 Hypopituitarism: Secondary | ICD-10-CM | POA: Diagnosis not present

## 2021-01-13 DIAGNOSIS — E039 Hypothyroidism, unspecified: Secondary | ICD-10-CM | POA: Diagnosis not present

## 2021-01-25 DIAGNOSIS — J45909 Unspecified asthma, uncomplicated: Secondary | ICD-10-CM | POA: Diagnosis not present

## 2021-01-25 DIAGNOSIS — E039 Hypothyroidism, unspecified: Secondary | ICD-10-CM | POA: Diagnosis not present

## 2021-01-25 DIAGNOSIS — K219 Gastro-esophageal reflux disease without esophagitis: Secondary | ICD-10-CM | POA: Diagnosis not present

## 2021-01-25 DIAGNOSIS — Z853 Personal history of malignant neoplasm of breast: Secondary | ICD-10-CM | POA: Diagnosis not present

## 2021-01-25 DIAGNOSIS — M810 Age-related osteoporosis without current pathological fracture: Secondary | ICD-10-CM | POA: Diagnosis not present

## 2021-01-25 DIAGNOSIS — I10 Essential (primary) hypertension: Secondary | ICD-10-CM | POA: Diagnosis not present

## 2021-02-07 ENCOUNTER — Other Ambulatory Visit: Payer: Self-pay

## 2021-02-08 ENCOUNTER — Other Ambulatory Visit (HOSPITAL_COMMUNITY): Payer: Medicare Other

## 2021-02-08 ENCOUNTER — Other Ambulatory Visit (HOSPITAL_COMMUNITY)
Admission: RE | Admit: 2021-02-08 | Discharge: 2021-02-08 | Disposition: A | Discharge status: Home / Self Care | Payer: Medicare Other | Source: Ambulatory Visit | Attending: Gastroenterology | Admitting: Gastroenterology

## 2021-02-08 DIAGNOSIS — H0102A Squamous blepharitis right eye, upper and lower eyelids: Secondary | ICD-10-CM | POA: Diagnosis not present

## 2021-02-08 DIAGNOSIS — H40033 Anatomical narrow angle, bilateral: Secondary | ICD-10-CM | POA: Diagnosis not present

## 2021-02-08 DIAGNOSIS — Z20822 Contact with and (suspected) exposure to covid-19: Secondary | ICD-10-CM | POA: Diagnosis not present

## 2021-02-08 DIAGNOSIS — H16121 Filamentary keratitis, right eye: Secondary | ICD-10-CM | POA: Diagnosis not present

## 2021-02-08 DIAGNOSIS — Z01812 Encounter for preprocedural laboratory examination: Secondary | ICD-10-CM | POA: Diagnosis not present

## 2021-02-08 DIAGNOSIS — G35 Multiple sclerosis: Secondary | ICD-10-CM | POA: Diagnosis not present

## 2021-02-08 DIAGNOSIS — H2513 Age-related nuclear cataract, bilateral: Secondary | ICD-10-CM | POA: Diagnosis not present

## 2021-02-08 DIAGNOSIS — H16223 Keratoconjunctivitis sicca, not specified as Sjogren's, bilateral: Secondary | ICD-10-CM | POA: Diagnosis not present

## 2021-02-08 DIAGNOSIS — H0102B Squamous blepharitis left eye, upper and lower eyelids: Secondary | ICD-10-CM | POA: Diagnosis not present

## 2021-02-08 LAB — SARS CORONAVIRUS 2 (TAT 6-24 HRS): SARS Coronavirus 2: NEGATIVE

## 2021-02-10 ENCOUNTER — Telehealth: Payer: Self-pay | Admitting: Genetic Counselor

## 2021-02-10 NOTE — Telephone Encounter (Signed)
Returned Daisy [name] phone call regarding her interest in updated genetic testing. Scheduled an in person genetic counseling appointment for Thursday, 02/24/21, at 11am.

## 2021-02-10 NOTE — Anesthesia Preprocedure Evaluation (Addendum)
Anesthesia Evaluation  Patient identified by MRN, date of birth, ID band Patient awake    Reviewed: Allergy & Precautions, NPO status , Patient's Chart, lab work & pertinent test results  History of Anesthesia Complications (+) PONV, POST - OP SPINAL HEADACHE and history of anesthetic complications  Airway Mallampati: II  TM Distance: >3 FB Neck ROM: Full    Dental  (+) Dental Advisory Given, Chipped,    Pulmonary asthma , sleep apnea , COPD,    Pulmonary exam normal        Cardiovascular hypertension, Pt. on medications Normal cardiovascular exam     Neuro/Psych  Headaches,  Neuromuscular disease (MS) negative psych ROS   GI/Hepatic hiatal hernia, GERD  Medicated and Controlled,(+) Hepatitis -, B  Endo/Other  Hypothyroidism  Addison's disease   Renal/GU negative Renal ROS     Musculoskeletal  (+) Arthritis , Fibromyalgia -, narcotic dependent  Abdominal   Peds  Hematology negative hematology ROS (+)   Anesthesia Other Findings Brittle bone disease Covid test negative   Reproductive/Obstetrics                            Anesthesia Physical Anesthesia Plan  ASA: III  Anesthesia Plan: MAC   Post-op Pain Management:    Induction: Intravenous  PONV Risk Score and Plan: 3 and Propofol infusion and Treatment may vary due to age or medical condition  Airway Management Planned: Nasal Cannula and Natural Airway  Additional Equipment: None  Intra-op Plan:   Post-operative Plan:   Informed Consent: I have reviewed the patients History and Physical, chart, labs and discussed the procedure including the risks, benefits and alternatives for the proposed anesthesia with the patient or authorized representative who has indicated his/her understanding and acceptance.       Plan Discussed with: CRNA and Anesthesiologist  Anesthesia Plan Comments:        Anesthesia Quick  Evaluation

## 2021-02-11 ENCOUNTER — Ambulatory Visit (HOSPITAL_COMMUNITY): Payer: Medicare Other | Admitting: Certified Registered Nurse Anesthetist

## 2021-02-11 ENCOUNTER — Encounter (HOSPITAL_COMMUNITY)
Admission: RE | Disposition: A | Discharge status: Home / Self Care | Payer: Self-pay | Source: Ambulatory Visit | Attending: Gastroenterology

## 2021-02-11 ENCOUNTER — Other Ambulatory Visit: Payer: Self-pay

## 2021-02-11 ENCOUNTER — Ambulatory Visit (HOSPITAL_COMMUNITY)
Admission: RE | Admit: 2021-02-11 | Discharge: 2021-02-11 | Disposition: A | Discharge status: Home / Self Care | Payer: Medicare Other | Source: Ambulatory Visit | Attending: Gastroenterology | Admitting: Gastroenterology

## 2021-02-11 ENCOUNTER — Encounter (HOSPITAL_COMMUNITY): Payer: Self-pay | Admitting: Gastroenterology

## 2021-02-11 DIAGNOSIS — K2210 Ulcer of esophagus without bleeding: Secondary | ICD-10-CM | POA: Diagnosis not present

## 2021-02-11 DIAGNOSIS — Z79899 Other long term (current) drug therapy: Secondary | ICD-10-CM | POA: Diagnosis not present

## 2021-02-11 DIAGNOSIS — K449 Diaphragmatic hernia without obstruction or gangrene: Secondary | ICD-10-CM | POA: Diagnosis not present

## 2021-02-11 DIAGNOSIS — Z881 Allergy status to other antibiotic agents status: Secondary | ICD-10-CM | POA: Insufficient documentation

## 2021-02-11 DIAGNOSIS — K224 Dyskinesia of esophagus: Secondary | ICD-10-CM | POA: Insufficient documentation

## 2021-02-11 DIAGNOSIS — K222 Esophageal obstruction: Secondary | ICD-10-CM | POA: Diagnosis not present

## 2021-02-11 DIAGNOSIS — Z7952 Long term (current) use of systemic steroids: Secondary | ICD-10-CM | POA: Insufficient documentation

## 2021-02-11 DIAGNOSIS — K317 Polyp of stomach and duodenum: Secondary | ICD-10-CM | POA: Diagnosis not present

## 2021-02-11 DIAGNOSIS — R1310 Dysphagia, unspecified: Secondary | ICD-10-CM | POA: Diagnosis not present

## 2021-02-11 DIAGNOSIS — K311 Adult hypertrophic pyloric stenosis: Secondary | ICD-10-CM | POA: Diagnosis not present

## 2021-02-11 DIAGNOSIS — Z888 Allergy status to other drugs, medicaments and biological substances status: Secondary | ICD-10-CM | POA: Insufficient documentation

## 2021-02-11 DIAGNOSIS — Z87892 Personal history of anaphylaxis: Secondary | ICD-10-CM | POA: Diagnosis not present

## 2021-02-11 DIAGNOSIS — Z885 Allergy status to narcotic agent status: Secondary | ICD-10-CM | POA: Diagnosis not present

## 2021-02-11 DIAGNOSIS — R234 Changes in skin texture: Secondary | ICD-10-CM | POA: Insufficient documentation

## 2021-02-11 DIAGNOSIS — K2289 Other specified disease of esophagus: Secondary | ICD-10-CM | POA: Diagnosis not present

## 2021-02-11 DIAGNOSIS — Z882 Allergy status to sulfonamides status: Secondary | ICD-10-CM | POA: Insufficient documentation

## 2021-02-11 DIAGNOSIS — K3189 Other diseases of stomach and duodenum: Secondary | ICD-10-CM | POA: Diagnosis not present

## 2021-02-11 DIAGNOSIS — Z79890 Hormone replacement therapy (postmenopausal): Secondary | ICD-10-CM | POA: Diagnosis not present

## 2021-02-11 HISTORY — PX: BOTOX INJECTION: SHX5754

## 2021-02-11 HISTORY — PX: ESOPHAGOGASTRODUODENOSCOPY (EGD) WITH PROPOFOL: SHX5813

## 2021-02-11 HISTORY — PX: BIOPSY: SHX5522

## 2021-02-11 HISTORY — PX: BALLOON DILATION: SHX5330

## 2021-02-11 SURGERY — ESOPHAGOGASTRODUODENOSCOPY (EGD) WITH PROPOFOL
Anesthesia: Monitor Anesthesia Care

## 2021-02-11 MED ORDER — LACTATED RINGERS IV SOLN
INTRAVENOUS | Status: DC
  Administered 2021-02-11: 1000 mL via INTRAVENOUS

## 2021-02-11 MED ORDER — SODIUM CHLORIDE (PF) 0.9 % IJ SOLN
INTRAMUSCULAR | Status: AC
  Filled 2021-02-11: qty 10

## 2021-02-11 MED ORDER — PROPOFOL 10 MG/ML IV BOLUS
INTRAVENOUS | Status: DC | PRN
  Administered 2021-02-11 (×2): 20 mg via INTRAVENOUS

## 2021-02-11 MED ORDER — SODIUM CHLORIDE 0.9 % IV SOLN: INTRAVENOUS | Status: DC

## 2021-02-11 MED ORDER — PROPOFOL 10 MG/ML IV BOLUS
INTRAVENOUS | Status: AC
  Filled 2021-02-11: qty 40

## 2021-02-11 MED ORDER — PROPOFOL 500 MG/50ML IV EMUL
INTRAVENOUS | Status: DC | PRN
  Administered 2021-02-11: 150 ug/kg/min via INTRAVENOUS

## 2021-02-11 MED ORDER — SODIUM CHLORIDE (PF) 0.9 % IJ SOLN
INTRAMUSCULAR | Status: DC | PRN
  Administered 2021-02-11: 4 mL via SUBMUCOSAL

## 2021-02-11 MED ORDER — LIDOCAINE 2% (20 MG/ML) 5 ML SYRINGE
INTRAMUSCULAR | Status: DC | PRN
  Administered 2021-02-11: 40 mg via INTRAVENOUS

## 2021-02-11 MED ORDER — ONABOTULINUMTOXINA 100 UNITS IJ SOLR
INTRAMUSCULAR | Status: AC
  Filled 2021-02-11: qty 100

## 2021-02-11 SURGICAL SUPPLY — 15 items

## 2021-02-11 NOTE — H&P (Signed)
Daisy [name] is an 70 y.o. female.   Chief Complaint: Dysphagia HPI: 70 year old female with hypertensive LES, not typical of achalasia and pyloric stenosis for EGD with Botox injection and possible balloon dilatation.  Past Medical History:  Diagnosis Date  . Addison's disease ([place])   . Adenocarcinoma of breast ([place]) 07/13/1992   LEFT LUMPECTOMY AND CHEMO AND RADIATION;   NOW HAS RIGHT BREAST MASS 09/25/14 - PLANS MASTECTOMY   . Adrenal insufficiency ([place])    "w/secondary Addison's"  . Anemia   . Arthritis   . Asthma    using incentive spirometer alot now  . Brittle bone disease   . Bruises easily   . Chronic bronchitis   . Chronic pain    DR. MARK PHILLIPS WITH PAIN CLINIC -  CHRONIC BACK PAIN AND SCOLIOSIS AND PREVIOUS BACK SURGERY AND NOW LUMBAR FRACTURE - PT HAS [employer] SPINAL STIMULATOR WHICH SHE CAN TURN ON AND OFF ; PT ON MORPINE AND CLONAZEPAM AND ROBAXIN  . COPD (chronic obstructive pulmonary disease) ([place])   . DDD (degenerative disc disease), lumbar   . Eye pain, right    last 4 months can be both eyes  . Falls frequently   . Family history of adverse reaction to anesthesia    ponv  . Fibromyalgia   . Fracture    LUMBAR 1 - FROM A FALL JUNE 2015 - PT HAS A LOT OF SEVERE BACK PAIN AND DIFFICULTY WALKING  . GERD (gastroesophageal reflux disease)   . H/O esophageal spasm    "I can have as many as 80 TUMS before they stop"  . H/O hiatal hernia   . Head injury with loss of consciousness ([place])   . Headache(784.0)    "when I don't eat" (02/19/12)  . 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 History of kidney stones    1 large stone now  . History of urinary urgency    AZO HELPS - DOES TURN URINE ORANGE; DR. Risa Tate IS PT'S UROLOGIST  . Hypertension   . Hypothyroidism   . Mass    right chest wall  . Migraines    "much better now" (02/19/12)  . Multiple sclerosis ([place]) 05/1988   Relasping and Remitting"  . OSA  (obstructive sleep apnea)   . Paget's disease of breast ([place]) 2010   left  . Pneumonia   . PONV (postoperative nausea and vomiting)    one episode 1993 vomiting after surgery  . Poor venous access    PT STATES HER PORT A CATH NOT TO BE USED FOR SURGERY/ IV'S  . Positive TB test 2017   "ran deep in my father's family; went on INH for awhile; it caused migraines"  . Radiation adverse effect    "100% blockage on left; 50% on right salivary; can drain w/massage"  . Restless leg syndrome, controlled 02/19/12   "much better since being on CPAP; been on that ~ 1 month now"  . Shingles   . Shortness of breath dyspnea    with exertion, improved, asthma improved  . Sleep apnea    no cpap used due to pt took off all the time   . Spinal headache   . Stress incontinence in female   . Upper GI bleeding 1970-1980   "had 4"    Past Surgical History:  Procedure Laterality Date  . ABDOMINAL HYSTERECTOMY  1987  . APPENDECTOMY  1960's  . BACK SURGERY  ~ 2004   "  chipped disk out to put in permanent stimulator"  . BALLOON DILATION N/A 04/23/2020   Procedure: BALLOON DILATION;  Surgeon: Ronnette Juniper, MD;  Location: [facility];  Service: Gastroenterology;  Laterality: N/A;  . BAND HEMORRHOIDECTOMY  1979  . BILATERAL OOPHORECTOMY  1988  . BIOPSY  06/11/2018   Procedure: BIOPSY;  Surgeon: Ronnette Juniper, MD;  Location: [facility];  Service: Gastroenterology;;  . BIOPSY  12/05/2018   Procedure: BIOPSY;  Surgeon: Ronnette Juniper, MD;  Location: [facility];  Service: Gastroenterology;;  . BIOPSY  04/23/2020   Procedure: BIOPSY;  Surgeon: Ronnette Juniper, MD;  [facility];  Service: Gastroenterology;;  . Lum Keas INJECTION N/A 11/26/2017   Procedure: BOTOX INJECTION;  Surgeon: Ronnette Juniper, MD;  Location: [facility];  Service: Gastroenterology;  Laterality: N/A;  . BOTOX INJECTION N/A 06/11/2018   Procedure: BOTOX INJECTION;  Surgeon: Ronnette Juniper, MD;  [facility];  Service: Gastroenterology;   Laterality: N/A;  . BOTOX INJECTION  12/05/2018   Procedure: BOTOX INJECTION;  Surgeon: Ronnette Juniper, MD;  [facility];  Service: Gastroenterology;;  . Lum Keas INJECTION  09/09/2019   Procedure: BOTOX INJECTION;  Surgeon: Clarene Essex, MD;  Location: [facility];  Service: Endoscopy;;  . BOTOX INJECTION N/A 04/23/2020   Procedure: BOTOX INJECTION;  Surgeon: Ronnette Juniper, MD;  [facility];  Service: Gastroenterology;  Laterality: N/A;  . BREAST LUMPECTOMY  07/13/1992   left   . CHOLECYSTECTOMY  1980's  . COLONOSCOPY    . CYSTOSCOPY WITH RETROGRADE PYELOGRAM, URETEROSCOPY AND STENT PLACEMENT Right 09/29/2015   Procedure: CYSTOSCOPY WITH RIGHT RETROGRADE PYELOGRAM, URETEROSCOPY AND STENT PLACEMENT, STONE EXTRACTION WITH BASKET;  Surgeon: Rana Snare, MD;  [facility];  Service: Urology;  Laterality: Right;  . ESOPHAGEAL MANOMETRY N/A 10/03/2017   Procedure: ESOPHAGEAL MANOMETRY (EM);  Surgeon: Ronnette Juniper, MD;  [facility];  Service: Gastroenterology;  Laterality: N/A;  . ESOPHAGOGASTRODUODENOSCOPY N/A 04/17/2014   Procedure: ESOPHAGOGASTRODUODENOSCOPY (EGD);  Surgeon: Missy Sabins, MD;  Location: [facility];  Service: Endoscopy;  Laterality: N/A;  . ESOPHAGOGASTRODUODENOSCOPY N/A 11/26/2017   Procedure: ESOPHAGOGASTRODUODENOSCOPY (EGD);  Surgeon: Ronnette Juniper, MD;  Location: [facility];  Service: Gastroenterology;  Laterality: N/A;  . ESOPHAGOGASTRODUODENOSCOPY N/A 06/11/2018   Procedure: ESOPHAGOGASTRODUODENOSCOPY (EGD);  Surgeon: Ronnette Juniper, MD;  Location: [facility];  Service: Gastroenterology;  Laterality: N/A;  . ESOPHAGOGASTRODUODENOSCOPY N/A 12/05/2018   Procedure: ESOPHAGOGASTRODUODENOSCOPY (EGD);  Surgeon: Ronnette Juniper, MD;  Location: [facility];  Service: Gastroenterology;  Laterality: N/A;  w/ botox injection  . ESOPHAGOGASTRODUODENOSCOPY (EGD) WITH PROPOFOL N/A 09/09/2019   Procedure: ESOPHAGOGASTRODUODENOSCOPY (EGD) WITH PROPOFOL;  Surgeon: Clarene Essex, MD;  Location: [facility];  Service: Endoscopy;  Laterality: N/A;  With probable Botox and doubtful dilation  . ESOPHAGOGASTRODUODENOSCOPY (EGD) WITH PROPOFOL N/A 04/23/2020   Procedure: ESOPHAGOGASTRODUODENOSCOPY (EGD) WITH PROPOFOL;  Surgeon: Ronnette Juniper, MD;  [facility];  Service: Gastroenterology;  Laterality: N/A;  . EXTRACORPOREAL SHOCK WAVE LITHOTRIPSY  2016  . fracture Left 03/2018   left foot fracture  . HOLMIUM LASER APPLICATION Right 97/35/3299   Procedure: HOLMIUM LASER LITHOTRIPSY ;  Surgeon: Rana Snare, MD;  Location: [facility];  Service: Urology;  Laterality: Right;  . LAPAROSCOPY    . MASS EXCISION Right 07/24/2018   Procedure: EXCISION OF RIGHT CHEST WALL MASS ERAS PATHWAY;  Surgeon: Coralie Keens, MD;  Location: [facility];  Service: General;  Laterality: Right;  . MASTECTOMY  2010   left   . MASTECTOMY Right 10/15  . PORT-A-CATH REMOVAL Right  09/25/2016   Procedure: REMOVAL PORT-A-CATH RIGHT;  Surgeon: Jackolyn Confer, MD;  Location: [facility];  Service: General;  Laterality: Right;  . PORTACATH PLACEMENT  10/30/2012   Procedure: INSERTION PORT-A-CATH;  Surgeon: Odis Hollingshead, MD;  Location: [facility];  Service: General;  Laterality: Right;  Right Ultrasound guided port-a-cath insertion  . SALIVARY GLAND SURGERY  ~ 2002   "left was 100% blocked twice; right 50%; only had one surgery"  . SALIVARY GLAND SURGERY LEFT    . SAVORY DILATION N/A 04/17/2014   Procedure: SAVORY DILATION;  Surgeon: Missy Sabins, MD;  [facility];  Service: Endoscopy;  Laterality: N/A;  . SPINAL CORD STIMULATOR INSERTION  ~ 2004  . SPINAL CORD STIMULATOR INSERTION N/A 04/09/2015   Procedure: Spinal cord stimulator revision and replacement of paddle electrode;  Surgeon: Kristeen Miss, MD;  Location: [facility];  Service: Neurosurgery;  Laterality: N/A;  Spinal cord stimulator revision and replacement of paddle electrode  . TONSILLECTOMY AND ADENOIDECTOMY  1964  . TOTAL  MASTECTOMY Right 09/25/2014   Procedure: RIGHT MASTECTOMY;  Surgeon: Jackolyn Confer, MD;  Location: [facility];  Service: General;  Laterality: Right;    Family History  Problem Relation Age of Onset  . Lung cancer Paternal Uncle        smoker  . Breast cancer Cousin 70       maternal cousin  . Throat cancer Cousin        maternal cousin   Social History:  reports that she has never smoked. She has never used smokeless tobacco. She reports that she does not drink alcohol and does not use drugs.  Allergies:  Allergies  Allergen Reactions  . Alendronate Other (See Comments)    ESOPHAGEAL IRRITATION DANGER OF ESOPHAGEAL PERF.  . Gabapentin Anaphylaxis  . Iodinated Diagnostic Agents Anaphylaxis and Other (See Comments)    Fever and blood clots, also ANAPHYLACTIC REACTION!-kmr-10/16/05  . Iodine-131 Anaphylaxis  . Iohexol Anaphylaxis and Other (See Comments)    ANAPHYLACTIC REACTION!-kmr-10/16/05   . Procaine Anaphylaxis, Rash and Other (See Comments)    Novocaine caused shaking, rash, chest heaviness, made patient feel sick    . Sulfa Antibiotics Swelling and Other (See Comments)    SWELLING REACTION UNSPECIFIED  HALLUCINATIONS   . Terbinafine Hcl Anaphylaxis and Other (See Comments)    THROAT CLOSE  . Terbinafine Hcl Anaphylaxis and Other (See Comments)    THROAT CLOSED  . Amlodipine Swelling and Other (See Comments)    SWELLING REACTION (patient is currently taking with mild fluid retention)  . Amoxicillin-Pot Clavulanate Diarrhea and Other (See Comments)    Bad headaches (tolerates PCN, per husband) Has patient had a PCN reaction causing immediate rash, facial/tongue/throat swelling, SOB or lightheadedness with hypotension: no Has patient had a PCN reaction causing severe rash involving mucus membranes or skin necrosis: no Has patient had a PCN reaction that required hospitalization no Has patient had a PCN reaction occurring within the last 10 years: no If all of the  above answers are "NO", then may proceed with Cephalosporin use.  . Clarithromycin Anxiety and Other (See Comments)    Caused the patient to be jittery  . Clindamycin/Lincomycin Other (See Comments)    Reaction not recalled  . Compazine Other (See Comments)    Hyper and shaky  . Dilaudid [Hydromorphone Hcl] Nausea And Vomiting and Other (See Comments)    HEADACHES  . Guaifenesin Nausea And Vomiting, Swelling and Other (See Comments)    SWELLING REACTION  UNSPECIFIED  HYPERACTIVITY ALSO  . Guaifenesin Nausea And Vomiting, Swelling, Anxiety and Other (See Comments)    Headaches and hyperactivity (SWELLING REACTION UNSPECIFIED)  . Hydromorphone Nausea And Vomiting and Other (See Comments)    HEADACHE >> MIGRAINE   . Hydromorphone Hcl Nausea And Vomiting and Other (See Comments)    HEADACHES, also  . Imiquimod Other (See Comments)    "Active and sick "  . Lisinopril Cough    Other reaction(s): Cough (ALLERGY/intolerance), Other  . Methocarbamol Other (See Comments)    "Makes pt feel irritable "   . Metoclopramide Other (See Comments)    "Hyper and shaky"   . Metoclopramide Hcl Other (See Comments)    Hyper, shaky  . Other Hives, Rash and Other (See Comments)    SOME TAPES CAUSE SKIN IRRITATION  . Prochlorperazine Anxiety and Other (See Comments)    "Hyper and shaky"  . Promethazine Anxiety and Other (See Comments)    DIZZINESS, also  . Sucralfate Nausea Only and Other (See Comments)    Distends stomach and hyperactivity   . Sulfamethoxazole-Trimethoprim Other (See Comments)    ALTERED MENTAL STATUS HALLUCINATIONS    . Trifluoperazine Other (See Comments)    Hallucinations   . Vancomycin Swelling and Other (See Comments)    SWELLING REACTION UNSPECIFIED   . Zolpidem Nausea And Vomiting and Other (See Comments)    Caused the patient to be shaky, too  . Metformin Other (See Comments)    Headaches and migraines     Medications Prior to Admission  Medication Sig  Dispense Refill  . amLODipine (NORVASC) 2.5 MG tablet Take 2.5 mg by mouth in the morning and at bedtime.    . Calcium-Vitamin D-Vitamin K 865-784-69 MG-UNT-MCG CHEW Chew 3 each by mouth daily.    . Carboxymethylcellulose Sod PF (LUBRICANT EYE DROPS PF) 0.5 % SOLN Apply 1-2 drops to eye 3 (three) times daily as needed (to lubricate the eyes).    . cyanocobalamin (,VITAMIN B-12,) 1000 MCG/ML injection Inject 1,000 mcg into the muscle every 30 (thirty) days.    Marland Kitchen levothyroxine (SYNTHROID, LEVOTHROID) 50 MCG tablet Take 50-75 mcg by mouth See admin instructions. Take 50 mcg by mouth in the morning "on even-numbered days" and alternate with 75 mcg on "odd-numbered days"    . losartan (COZAAR) 100 MG tablet Take 100 mg by mouth every morning. Take 100 mg by mouth in the morning  0  . morphine (MSIR) 30 MG tablet Take 30 mg by mouth every 8 (eight) hours.    . ondansetron (ZOFRAN-ODT) 4 MG disintegrating tablet Take 4 mg by mouth every 8 (eight) hours as needed for nausea or vomiting.    . pantoprazole (PROTONIX) 40 MG tablet Take 40 mg by mouth daily before breakfast.     . predniSONE (DELTASONE) 1 MG tablet Take 4 mg by mouth daily with breakfast.    . senna-docusate (SENOKOT-S) 8.6-50 MG tablet Take 1 tablet 2 (two) times daily by mouth. (Patient taking differently: Take 1 tablet by mouth in the morning, at noon, and at bedtime.) 60 tablet 0  . valACYclovir (VALTREX) 1000 MG tablet Take 1,000 mg by mouth 3 (three) times daily as needed (for shingles outbreaks).       No results found for this or any previous visit (from the past 48 hour(s)). No results found.  Review of Systems  Constitutional: Positive for activity change, appetite change and fatigue.  Respiratory: Negative.   Cardiovascular: Negative.   Gastrointestinal: Negative.  Blood pressure (!) 152/81, pulse 93, temperature 98.9 F (37.2 C), temperature source Oral, resp. rate 12, height 5' 2.5" (1.588 m), weight 58.1 kg, SpO2 96  %. Physical Exam Constitutional:      Appearance: Normal appearance.  HENT:     Head: Normocephalic and atraumatic.     Nose: Nose normal.  Eyes:     Conjunctiva/sclera: Conjunctivae normal.  Cardiovascular:     Rate and Rhythm: Normal rate and regular rhythm.     Heart sounds: Normal heart sounds.  Pulmonary:     Effort: Pulmonary effort is normal.  Abdominal:     General: Bowel sounds are normal.  Skin:    General: Skin is warm.  Neurological:     General: No focal deficit present.     Mental Status: She is alert and oriented to person, place, and time.  Psychiatric:        Mood and Affect: Mood normal.        Behavior: Behavior normal.      Assessment/Plan Dysphagia, hypertensive LES Pyloric stenosis  EGD with Botox injection and balloon dilatation. The risks and the benefits of the procedure were discussed with the patient in details. She understands and verbalizes consent.  Ronnette Juniper, MD 02/11/2021, 8:51 AM

## 2021-02-11 NOTE — Transfer of Care (Signed)
Immediate Anesthesia Transfer of Care Note  Patient: Daisy [name]  Procedure(s) Performed: ESOPHAGOGASTRODUODENOSCOPY (EGD) WITH PROPOFOL (N/A ) BOTOX INJECTION (N/A ) BALLOON DILATION (N/A ) BIOPSY  Patient Location: Endoscopy Unit  Anesthesia Type:MAC  Level of Consciousness: drowsy  Airway & Oxygen Therapy: Patient Spontanous Breathing and Patient connected to face mask  Post-op Assessment: Report given to RN and Post -op Vital signs reviewed and stable  Post vital signs: Reviewed and stable  Last Vitals:  Vitals Value Taken Time  BP 132/67 02/11/21 0958  Temp    Pulse 79 02/11/21 0959  Resp 13 02/11/21 0959  SpO2 100 % 02/11/21 0959  Vitals shown include unvalidated device data.  Last Pain:  Vitals:   02/11/21 0837  TempSrc: Oral  PainSc: 0-No pain         Complications: No complications documented.

## 2021-02-11 NOTE — Interval H&P Note (Signed)
History and Physical Interval Note: 69/female with dysphagia and pyloric stenosis for EGD with balloon dilation and botox injection.  02/11/2021 9:07 AM  Daisy [name]  has presented today for EGD with botox injection and balloon dilation, with the diagnosis of Esophageal dismotility and stricture.  The various methods of treatment have been discussed with the patient and family. After consideration of risks, benefits and other options for treatment, the patient has consented to  Procedure(s): ESOPHAGOGASTRODUODENOSCOPY (EGD) WITH PROPOFOL (N/A) BOTOX INJECTION (N/A) as a surgical intervention.  The patient's history has been reviewed, patient examined, no change in status, stable for surgery.  I have reviewed the patient's chart and labs.  Questions were answered to the patient's satisfaction.     Ronnette Juniper

## 2021-02-11 NOTE — Anesthesia Postprocedure Evaluation (Signed)
Anesthesia Post Note  Patient: Daisy [name]  Procedure(s) Performed: ESOPHAGOGASTRODUODENOSCOPY (EGD) WITH PROPOFOL (N/A ) BOTOX INJECTION (N/A ) BALLOON DILATION (N/A ) BIOPSY     Patient location during evaluation: PACU Anesthesia Type: MAC Level of consciousness: awake and alert Pain management: pain level controlled Vital Signs Assessment: post-procedure vital signs reviewed and stable Respiratory status: spontaneous breathing, nonlabored ventilation and respiratory function stable Cardiovascular status: stable and blood pressure returned to baseline Anesthetic complications: no   No complications documented.  Last Vitals:  Vitals:   02/11/21 1010 02/11/21 1025  BP: (!) 154/75 (!) 166/93  Pulse: 80 92  Resp: 16 13  Temp:    SpO2: 97% 98%    Last Pain:  Vitals:   02/11/21 1025  TempSrc:   PainSc: 0-No pain                 Audry Pili

## 2021-02-11 NOTE — Op Note (Signed)
[facility] Patient Name: Daisy [name] Procedure Date: 02/11/2021 MRN: [id number] Attending MD: Ronnette Juniper , MD Date of Birth: 1951/04/11 CSN: [id number] Age: 70 Admit Type: Outpatient Procedure:                Upper GI endoscopy Indications:              Dysphagia, For therapy of esophageal stenosis, For                            therapy of pyloric stenosis, history of esophageal                            dissecans Providers:                Ronnette Juniper, MD, Particia Nearing, RN, Laverda Sorenson,                            Technician, Dellie Catholic Referring MD:             Lawerance Cruel, MD Medicines:                Monitored Anesthesia Care Complications:            No immediate complications. Estimated blood loss:                            Minimal. Estimated Blood Loss:     Estimated blood loss was minimal. Procedure:                Pre-Anesthesia Assessment:                           - Prior to the procedure, a History and Physical                            was performed, and patient medications and                            allergies were reviewed. The patient's tolerance of                            previous anesthesia was also reviewed. The risks                            and benefits of the procedure and the sedation                            options and risks were discussed with the patient.                            All questions were answered, and informed consent                            was obtained. Prior Anticoagulants: The patient has  taken no previous anticoagulant or antiplatelet                            agents. ASA Grade Assessment: III - A patient with                            severe systemic disease. After reviewing the risks                            and benefits, the patient was deemed in                            satisfactory condition to undergo the procedure.                           After obtaining  informed consent, the endoscope was                            passed under direct vision. Throughout the                            procedure, the patient's blood pressure, pulse, and                            oxygen saturations were monitored continuously. The                            GIF-H190 ([id number]) Olympus gastroscope was                            introduced through the mouth, and advanced to the                            second part of duodenum. The upper GI endoscopy was                            accomplished without difficulty. The patient                            tolerated the procedure well. Scope In: Scope Out: Findings:      Localized severe mucosal changes characterized by altered texture and       mucosal sloughing(prior biopsies showed esophageal dissecans) were found       in the lower third of the esophagus from 30 to 35 cm from insertion.       Biopsies were taken with a cold forceps for histology.      One benign-appearing, intrinsic severe (stenosis; an endoscope cannot       pass) stenosis was found 35 cm from the incisors.      The adult gastroscope was exchanged for a smaller pediatric gastroscope       and the endoscopic evaluation was completed by passage of the scope from       the distal esophagus, into the gastric cavity, through pyloric stenosis,       into the duodenum. The pediatric scope  was then withdrawn and exchanged       for an adult gastroscope for dilation.      A TTS dilator was passed through the scope. Dilation with an 18 mm       pyloric balloon dilator was performed. The dilation site was examined       following endoscope reinsertion and showed moderate mucosal disruption       and complete resolution of luminal narrowing. The stenosis was traversed       after dilation.      A small hiatal hernia was present.      A few small sessile polyps were found in the gastric body.      The cardia and gastric fundus were otherwise normal on  retroflexion.      A deformity was found at the pylorus.      A benign-appearing, intrinsic moderate stenosis was found at the       pylorus. This was traversed. A TTS dilator was passed through the scope.       Dilation with a 20 mm pyloric balloon dilator was performed. The       dilation site was examined following endoscope reinsertion and showed       mild mucosal disruption.      An area in the distal esophagus, 1 cm above the GE junction was       successfully injected with 100 units botulinum toxin, 25 units/ml, 1 ml       each in 4 quadrants of distal esophagus. Impression:               - Altered texture mucosa in the esophagus. Biopsied.                           - Benign-appearing esophageal stenosis. Dilated.                           - Small hiatal hernia.                           - A few gastric polyps.                           - Deformity in the pylorus.                           - Gastric stenosis was found at the pylorus.                            Dilated.                           - An area in the distal esophagus successfully                            injected with botulinum toxin. Moderate Sedation:      Patient did not receive moderate sedation for this procedure, but       instead received monitored anesthesia care. Recommendation:           - Patient has a contact number available for  emergencies. The signs and symptoms of potential                            delayed complications were discussed with the                            patient. Return to normal activities tomorrow.                            Written discharge instructions were provided to the                            patient.                           - Resume regular diet.                           - Continue present medications.                           - Await pathology results. Procedure Code(s):        --- Professional ---                           [phone],  Esophagogastroduodenoscopy, flexible,                            transoral; with dilation of gastric/duodenal                            stricture(s) (eg, balloon, bougie)                           43249, Esophagogastroduodenoscopy, flexible,                            transoral; with transendoscopic balloon dilation of                            esophagus (less than 30 mm diameter)                           43239, 59, Esophagogastroduodenoscopy, flexible,                            transoral; with biopsy, single or multiple                           43236, 59, Esophagogastroduodenoscopy, flexible,                            transoral; with directed submucosal injection(s),                            any substance Diagnosis Code(s):        --- Professional ---  K22.2, Esophageal obstruction                           K44.9, Diaphragmatic hernia without obstruction or                            gangrene                           K31.7, Polyp of stomach and duodenum                           K31.89, Other diseases of stomach and duodenum                           K31.1, Adult hypertrophic pyloric stenosis                           R13.10, Dysphagia, unspecified CPT copyright 2019 American Medical Association. All rights reserved. The codes documented in this report are preliminary and upon coder review may  be revised to meet current compliance requirements. Ronnette Juniper, MD 02/11/2021 10:07:02 AM This report has been signed electronically. Number of Addenda: 0

## 2021-02-11 NOTE — Discharge Instructions (Signed)

## 2021-02-11 NOTE — Anesthesia Procedure Notes (Signed)
Procedure Name: MAC Date/Time: 02/11/2021 9:10 AM Performed by: Claudia Desanctis, CRNA Pre-anesthesia Checklist: Patient identified, Emergency Drugs available, Suction available and Patient being monitored Patient Re-evaluated:Patient Re-evaluated prior to induction Oxygen Delivery Method: Simple face mask

## 2021-02-11 NOTE — Brief Op Note (Signed)
02/11/2021  10:07 AM  PATIENT:  Daisy [name]  70 y.o. female  PRE-OPERATIVE DIAGNOSIS:  Esophageal dismotility and stricture  POST-OPERATIVE DIAGNOSIS:  Esophageal dilation,esophageal botox  PROCEDURE:  Procedure(s): ESOPHAGOGASTRODUODENOSCOPY (EGD) WITH PROPOFOL (N/A) BOTOX INJECTION (N/A) BALLOON DILATION (N/A) BIOPSY  SURGEON:  Surgeon(s) and Role:    Ronnette Juniper, MD - Primary  PHYSICIAN ASSISTANT:   ASSISTANTS: Al Decant, Tech   ANESTHESIA:   MAC  EBL:  Minimal  BLOOD ADMINISTERED:none  DRAINS: none   LOCAL MEDICATIONS USED:  NONE  SPECIMEN:  Biopsy / Limited Resection  DISPOSITION OF SPECIMEN:  PATHOLOGY  COUNTS:  YES  TOURNIQUET:  * No tourniquets in log *  DICTATION: .Dragon Dictation  PLAN OF CARE: Discharge to home after PACU  PATIENT DISPOSITION:  PACU - hemodynamically stable.   Delay start of Pharmacological VTE agent (>24hrs) due to surgical blood loss or risk of bleeding: not applicable

## 2021-02-14 ENCOUNTER — Encounter (HOSPITAL_COMMUNITY): Payer: Self-pay | Admitting: Gastroenterology

## 2021-02-14 LAB — SURGICAL PATHOLOGY

## 2021-02-16 DIAGNOSIS — M41115 Juvenile idiopathic scoliosis, thoracolumbar region: Secondary | ICD-10-CM | POA: Diagnosis not present

## 2021-02-16 DIAGNOSIS — G894 Chronic pain syndrome: Secondary | ICD-10-CM | POA: Diagnosis not present

## 2021-02-16 DIAGNOSIS — Z79891 Long term (current) use of opiate analgesic: Secondary | ICD-10-CM | POA: Diagnosis not present

## 2021-02-16 DIAGNOSIS — M4726 Other spondylosis with radiculopathy, lumbar region: Secondary | ICD-10-CM | POA: Diagnosis not present

## 2021-02-22 ENCOUNTER — Telehealth: Payer: Self-pay | Admitting: Genetic Counselor

## 2021-02-22 NOTE — Telephone Encounter (Signed)
Returned Daisy [name]'s call to reschedule her genetic counseling appointment. Unfortunately, she has shingles and is not sure when she will be better. She declined the option to switch her upcoming appointment on 3/24 to virtual. To allow her enough time for recovery, we have rescheduled her genetic counseling appointment for Thursday, June 2nd at 3:00 pm.

## 2021-02-24 ENCOUNTER — Encounter: Payer: Medicare Other | Admitting: Genetic Counselor

## 2021-02-24 ENCOUNTER — Other Ambulatory Visit: Payer: Medicare Other

## 2021-02-28 DIAGNOSIS — M810 Age-related osteoporosis without current pathological fracture: Secondary | ICD-10-CM | POA: Diagnosis not present

## 2021-02-28 DIAGNOSIS — I10 Essential (primary) hypertension: Secondary | ICD-10-CM | POA: Diagnosis not present

## 2021-02-28 DIAGNOSIS — J45909 Unspecified asthma, uncomplicated: Secondary | ICD-10-CM | POA: Diagnosis not present

## 2021-02-28 DIAGNOSIS — K219 Gastro-esophageal reflux disease without esophagitis: Secondary | ICD-10-CM | POA: Diagnosis not present

## 2021-02-28 DIAGNOSIS — Z853 Personal history of malignant neoplasm of breast: Secondary | ICD-10-CM | POA: Diagnosis not present

## 2021-02-28 DIAGNOSIS — E039 Hypothyroidism, unspecified: Secondary | ICD-10-CM | POA: Diagnosis not present

## 2021-03-01 DIAGNOSIS — M4726 Other spondylosis with radiculopathy, lumbar region: Secondary | ICD-10-CM | POA: Diagnosis not present

## 2021-03-01 DIAGNOSIS — G894 Chronic pain syndrome: Secondary | ICD-10-CM | POA: Diagnosis not present

## 2021-03-01 DIAGNOSIS — M41115 Juvenile idiopathic scoliosis, thoracolumbar region: Secondary | ICD-10-CM | POA: Diagnosis not present

## 2021-03-01 DIAGNOSIS — Z79891 Long term (current) use of opiate analgesic: Secondary | ICD-10-CM | POA: Diagnosis not present

## 2021-03-21 DIAGNOSIS — Z853 Personal history of malignant neoplasm of breast: Secondary | ICD-10-CM | POA: Diagnosis not present

## 2021-03-21 DIAGNOSIS — I10 Essential (primary) hypertension: Secondary | ICD-10-CM | POA: Diagnosis not present

## 2021-03-21 DIAGNOSIS — K219 Gastro-esophageal reflux disease without esophagitis: Secondary | ICD-10-CM | POA: Diagnosis not present

## 2021-03-21 DIAGNOSIS — E039 Hypothyroidism, unspecified: Secondary | ICD-10-CM | POA: Diagnosis not present

## 2021-03-21 DIAGNOSIS — J45909 Unspecified asthma, uncomplicated: Secondary | ICD-10-CM | POA: Diagnosis not present

## 2021-03-21 DIAGNOSIS — M810 Age-related osteoporosis without current pathological fracture: Secondary | ICD-10-CM | POA: Diagnosis not present

## 2021-03-30 DIAGNOSIS — R062 Wheezing: Secondary | ICD-10-CM | POA: Diagnosis not present

## 2021-03-30 DIAGNOSIS — H9201 Otalgia, right ear: Secondary | ICD-10-CM | POA: Diagnosis not present

## 2021-04-04 DIAGNOSIS — K222 Esophageal obstruction: Secondary | ICD-10-CM | POA: Diagnosis not present

## 2021-04-04 DIAGNOSIS — R1310 Dysphagia, unspecified: Secondary | ICD-10-CM | POA: Diagnosis not present

## 2021-04-05 DIAGNOSIS — M26629 Arthralgia of temporomandibular joint, unspecified side: Secondary | ICD-10-CM | POA: Diagnosis not present

## 2021-04-18 DIAGNOSIS — Z79891 Long term (current) use of opiate analgesic: Secondary | ICD-10-CM | POA: Diagnosis not present

## 2021-04-18 DIAGNOSIS — M4726 Other spondylosis with radiculopathy, lumbar region: Secondary | ICD-10-CM | POA: Diagnosis not present

## 2021-04-18 DIAGNOSIS — G894 Chronic pain syndrome: Secondary | ICD-10-CM | POA: Diagnosis not present

## 2021-04-18 DIAGNOSIS — M41115 Juvenile idiopathic scoliosis, thoracolumbar region: Secondary | ICD-10-CM | POA: Diagnosis not present

## 2021-04-25 ENCOUNTER — Telehealth: Payer: Self-pay | Admitting: Genetic Counselor

## 2021-04-25 DIAGNOSIS — K219 Gastro-esophageal reflux disease without esophagitis: Secondary | ICD-10-CM | POA: Diagnosis not present

## 2021-04-25 DIAGNOSIS — M810 Age-related osteoporosis without current pathological fracture: Secondary | ICD-10-CM | POA: Diagnosis not present

## 2021-04-25 DIAGNOSIS — E039 Hypothyroidism, unspecified: Secondary | ICD-10-CM | POA: Diagnosis not present

## 2021-04-25 DIAGNOSIS — I10 Essential (primary) hypertension: Secondary | ICD-10-CM | POA: Diagnosis not present

## 2021-04-25 DIAGNOSIS — J45909 Unspecified asthma, uncomplicated: Secondary | ICD-10-CM | POA: Diagnosis not present

## 2021-04-25 NOTE — Telephone Encounter (Signed)
Daisy [name] called to confirm her genetic counseling appointment on 6/2 at 3:00pm.

## 2021-04-26 ENCOUNTER — Other Ambulatory Visit: Payer: Medicare Other

## 2021-05-05 ENCOUNTER — Inpatient Hospital Stay: Payer: Medicare Other

## 2021-05-05 ENCOUNTER — Inpatient Hospital Stay: Payer: Medicare Other | Admitting: Genetic Counselor

## 2021-05-11 DIAGNOSIS — R059 Cough, unspecified: Secondary | ICD-10-CM | POA: Diagnosis not present

## 2021-05-11 DIAGNOSIS — J019 Acute sinusitis, unspecified: Secondary | ICD-10-CM | POA: Diagnosis not present

## 2021-05-11 DIAGNOSIS — R112 Nausea with vomiting, unspecified: Secondary | ICD-10-CM | POA: Diagnosis not present

## 2021-05-11 DIAGNOSIS — J029 Acute pharyngitis, unspecified: Secondary | ICD-10-CM | POA: Diagnosis not present

## 2021-05-16 DIAGNOSIS — K219 Gastro-esophageal reflux disease without esophagitis: Secondary | ICD-10-CM | POA: Diagnosis not present

## 2021-05-16 DIAGNOSIS — J45909 Unspecified asthma, uncomplicated: Secondary | ICD-10-CM | POA: Diagnosis not present

## 2021-05-16 DIAGNOSIS — E039 Hypothyroidism, unspecified: Secondary | ICD-10-CM | POA: Diagnosis not present

## 2021-05-16 DIAGNOSIS — M810 Age-related osteoporosis without current pathological fracture: Secondary | ICD-10-CM | POA: Diagnosis not present

## 2021-05-16 DIAGNOSIS — I10 Essential (primary) hypertension: Secondary | ICD-10-CM | POA: Diagnosis not present

## 2021-05-25 DIAGNOSIS — K222 Esophageal obstruction: Secondary | ICD-10-CM | POA: Diagnosis not present

## 2021-05-25 DIAGNOSIS — M549 Dorsalgia, unspecified: Secondary | ICD-10-CM | POA: Diagnosis not present

## 2021-05-25 DIAGNOSIS — G35 Multiple sclerosis: Secondary | ICD-10-CM | POA: Diagnosis not present

## 2021-05-25 DIAGNOSIS — R079 Chest pain, unspecified: Secondary | ICD-10-CM | POA: Diagnosis not present

## 2021-05-25 DIAGNOSIS — R1310 Dysphagia, unspecified: Secondary | ICD-10-CM | POA: Diagnosis not present

## 2021-05-25 DIAGNOSIS — K317 Polyp of stomach and duodenum: Secondary | ICD-10-CM | POA: Diagnosis not present

## 2021-05-25 DIAGNOSIS — K2289 Other specified disease of esophagus: Secondary | ICD-10-CM | POA: Diagnosis not present

## 2021-05-25 DIAGNOSIS — I10 Essential (primary) hypertension: Secondary | ICD-10-CM | POA: Diagnosis not present

## 2021-06-13 DIAGNOSIS — Z79891 Long term (current) use of opiate analgesic: Secondary | ICD-10-CM | POA: Diagnosis not present

## 2021-06-13 DIAGNOSIS — G894 Chronic pain syndrome: Secondary | ICD-10-CM | POA: Diagnosis not present

## 2021-06-13 DIAGNOSIS — M41115 Juvenile idiopathic scoliosis, thoracolumbar region: Secondary | ICD-10-CM | POA: Diagnosis not present

## 2021-06-13 DIAGNOSIS — M4726 Other spondylosis with radiculopathy, lumbar region: Secondary | ICD-10-CM | POA: Diagnosis not present

## 2021-06-15 DIAGNOSIS — Z853 Personal history of malignant neoplasm of breast: Secondary | ICD-10-CM | POA: Diagnosis not present

## 2021-06-15 DIAGNOSIS — E039 Hypothyroidism, unspecified: Secondary | ICD-10-CM | POA: Diagnosis not present

## 2021-06-15 DIAGNOSIS — K219 Gastro-esophageal reflux disease without esophagitis: Secondary | ICD-10-CM | POA: Diagnosis not present

## 2021-06-15 DIAGNOSIS — M810 Age-related osteoporosis without current pathological fracture: Secondary | ICD-10-CM | POA: Diagnosis not present

## 2021-06-15 DIAGNOSIS — J45909 Unspecified asthma, uncomplicated: Secondary | ICD-10-CM | POA: Diagnosis not present

## 2021-06-15 DIAGNOSIS — I10 Essential (primary) hypertension: Secondary | ICD-10-CM | POA: Diagnosis not present

## 2021-06-28 DIAGNOSIS — L570 Actinic keratosis: Secondary | ICD-10-CM | POA: Diagnosis not present

## 2021-07-19 DIAGNOSIS — E538 Deficiency of other specified B group vitamins: Secondary | ICD-10-CM | POA: Diagnosis not present

## 2021-07-19 DIAGNOSIS — Z79891 Long term (current) use of opiate analgesic: Secondary | ICD-10-CM | POA: Diagnosis not present

## 2021-07-19 DIAGNOSIS — M549 Dorsalgia, unspecified: Secondary | ICD-10-CM | POA: Diagnosis not present

## 2021-07-19 DIAGNOSIS — Z87442 Personal history of urinary calculi: Secondary | ICD-10-CM | POA: Diagnosis not present

## 2021-07-19 DIAGNOSIS — Z8742 Personal history of other diseases of the female genital tract: Secondary | ICD-10-CM | POA: Diagnosis not present

## 2021-07-19 DIAGNOSIS — M419 Scoliosis, unspecified: Secondary | ICD-10-CM | POA: Diagnosis not present

## 2021-07-19 DIAGNOSIS — Z79890 Hormone replacement therapy (postmenopausal): Secondary | ICD-10-CM | POA: Diagnosis not present

## 2021-07-19 DIAGNOSIS — Z8619 Personal history of other infectious and parasitic diseases: Secondary | ICD-10-CM | POA: Diagnosis not present

## 2021-07-19 DIAGNOSIS — Z853 Personal history of malignant neoplasm of breast: Secondary | ICD-10-CM | POA: Diagnosis not present

## 2021-07-19 DIAGNOSIS — Z79899 Other long term (current) drug therapy: Secondary | ICD-10-CM | POA: Diagnosis not present

## 2021-07-19 DIAGNOSIS — G35 Multiple sclerosis: Secondary | ICD-10-CM | POA: Diagnosis not present

## 2021-07-19 DIAGNOSIS — K219 Gastro-esophageal reflux disease without esophagitis: Secondary | ICD-10-CM | POA: Diagnosis not present

## 2021-07-19 DIAGNOSIS — Z7952 Long term (current) use of systemic steroids: Secondary | ICD-10-CM | POA: Diagnosis not present

## 2021-07-19 DIAGNOSIS — Z91041 Radiographic dye allergy status: Secondary | ICD-10-CM | POA: Diagnosis not present

## 2021-07-19 DIAGNOSIS — Z9013 Acquired absence of bilateral breasts and nipples: Secondary | ICD-10-CM | POA: Diagnosis not present

## 2021-07-19 DIAGNOSIS — Z9049 Acquired absence of other specified parts of digestive tract: Secondary | ICD-10-CM | POA: Diagnosis not present

## 2021-07-19 DIAGNOSIS — J45909 Unspecified asthma, uncomplicated: Secondary | ICD-10-CM | POA: Diagnosis not present

## 2021-07-19 DIAGNOSIS — K222 Esophageal obstruction: Secondary | ICD-10-CM | POA: Diagnosis not present

## 2021-07-19 DIAGNOSIS — I10 Essential (primary) hypertension: Secondary | ICD-10-CM | POA: Diagnosis not present

## 2021-07-19 DIAGNOSIS — G8929 Other chronic pain: Secondary | ICD-10-CM | POA: Diagnosis not present

## 2021-07-19 DIAGNOSIS — E038 Other specified hypothyroidism: Secondary | ICD-10-CM | POA: Diagnosis not present

## 2021-07-19 DIAGNOSIS — Z981 Arthrodesis status: Secondary | ICD-10-CM | POA: Diagnosis not present

## 2021-07-19 DIAGNOSIS — E271 Primary adrenocortical insufficiency: Secondary | ICD-10-CM | POA: Diagnosis not present

## 2021-07-19 DIAGNOSIS — K2289 Other specified disease of esophagus: Secondary | ICD-10-CM | POA: Diagnosis not present

## 2021-07-19 DIAGNOSIS — Z9682 Presence of neurostimulator: Secondary | ICD-10-CM | POA: Diagnosis not present

## 2021-07-24 DIAGNOSIS — Z20822 Contact with and (suspected) exposure to covid-19: Secondary | ICD-10-CM | POA: Diagnosis not present

## 2021-07-27 DIAGNOSIS — J45909 Unspecified asthma, uncomplicated: Secondary | ICD-10-CM | POA: Diagnosis not present

## 2021-07-27 DIAGNOSIS — M810 Age-related osteoporosis without current pathological fracture: Secondary | ICD-10-CM | POA: Diagnosis not present

## 2021-07-27 DIAGNOSIS — I10 Essential (primary) hypertension: Secondary | ICD-10-CM | POA: Diagnosis not present

## 2021-07-27 DIAGNOSIS — Z853 Personal history of malignant neoplasm of breast: Secondary | ICD-10-CM | POA: Diagnosis not present

## 2021-07-27 DIAGNOSIS — K219 Gastro-esophageal reflux disease without esophagitis: Secondary | ICD-10-CM | POA: Diagnosis not present

## 2021-07-27 DIAGNOSIS — E039 Hypothyroidism, unspecified: Secondary | ICD-10-CM | POA: Diagnosis not present

## 2021-08-09 DIAGNOSIS — G894 Chronic pain syndrome: Secondary | ICD-10-CM | POA: Diagnosis not present

## 2021-08-09 DIAGNOSIS — M4726 Other spondylosis with radiculopathy, lumbar region: Secondary | ICD-10-CM | POA: Diagnosis not present

## 2021-08-09 DIAGNOSIS — Z79891 Long term (current) use of opiate analgesic: Secondary | ICD-10-CM | POA: Diagnosis not present

## 2021-08-09 DIAGNOSIS — M41115 Juvenile idiopathic scoliosis, thoracolumbar region: Secondary | ICD-10-CM | POA: Diagnosis not present

## 2021-08-12 ENCOUNTER — Other Ambulatory Visit: Payer: Self-pay | Admitting: Family Medicine

## 2021-08-12 DIAGNOSIS — M810 Age-related osteoporosis without current pathological fracture: Secondary | ICD-10-CM

## 2021-08-18 DIAGNOSIS — K219 Gastro-esophageal reflux disease without esophagitis: Secondary | ICD-10-CM | POA: Diagnosis not present

## 2021-08-18 DIAGNOSIS — M545 Low back pain, unspecified: Secondary | ICD-10-CM | POA: Diagnosis not present

## 2021-08-18 DIAGNOSIS — E2749 Other adrenocortical insufficiency: Secondary | ICD-10-CM | POA: Diagnosis not present

## 2021-08-18 DIAGNOSIS — Z853 Personal history of malignant neoplasm of breast: Secondary | ICD-10-CM | POA: Diagnosis not present

## 2021-08-18 DIAGNOSIS — Z883 Allergy status to other anti-infective agents status: Secondary | ICD-10-CM | POA: Diagnosis not present

## 2021-08-18 DIAGNOSIS — Z885 Allergy status to narcotic agent status: Secondary | ICD-10-CM | POA: Diagnosis not present

## 2021-08-18 DIAGNOSIS — J45909 Unspecified asthma, uncomplicated: Secondary | ICD-10-CM | POA: Diagnosis not present

## 2021-08-18 DIAGNOSIS — K222 Esophageal obstruction: Secondary | ICD-10-CM | POA: Diagnosis not present

## 2021-08-18 DIAGNOSIS — Z888 Allergy status to other drugs, medicaments and biological substances status: Secondary | ICD-10-CM | POA: Diagnosis not present

## 2021-08-18 DIAGNOSIS — K2289 Other specified disease of esophagus: Secondary | ICD-10-CM | POA: Diagnosis not present

## 2021-08-18 DIAGNOSIS — G35 Multiple sclerosis: Secondary | ICD-10-CM | POA: Diagnosis not present

## 2021-08-18 DIAGNOSIS — G8929 Other chronic pain: Secondary | ICD-10-CM | POA: Diagnosis not present

## 2021-08-18 DIAGNOSIS — K209 Esophagitis, unspecified without bleeding: Secondary | ICD-10-CM | POA: Diagnosis not present

## 2021-08-18 DIAGNOSIS — Z881 Allergy status to other antibiotic agents status: Secondary | ICD-10-CM | POA: Diagnosis not present

## 2021-08-18 DIAGNOSIS — I10 Essential (primary) hypertension: Secondary | ICD-10-CM | POA: Diagnosis not present

## 2021-08-18 DIAGNOSIS — Z91041 Radiographic dye allergy status: Secondary | ICD-10-CM | POA: Diagnosis not present

## 2021-08-18 DIAGNOSIS — K311 Adult hypertrophic pyloric stenosis: Secondary | ICD-10-CM | POA: Diagnosis not present

## 2021-08-18 DIAGNOSIS — Z79891 Long term (current) use of opiate analgesic: Secondary | ICD-10-CM | POA: Diagnosis not present

## 2021-08-18 DIAGNOSIS — K208 Other esophagitis without bleeding: Secondary | ICD-10-CM | POA: Diagnosis not present

## 2021-08-18 DIAGNOSIS — E039 Hypothyroidism, unspecified: Secondary | ICD-10-CM | POA: Diagnosis not present

## 2021-08-22 ENCOUNTER — Other Ambulatory Visit (HOSPITAL_COMMUNITY): Payer: Self-pay

## 2021-08-22 DIAGNOSIS — R1310 Dysphagia, unspecified: Secondary | ICD-10-CM

## 2021-08-22 DIAGNOSIS — R059 Cough, unspecified: Secondary | ICD-10-CM

## 2021-08-29 DIAGNOSIS — J45909 Unspecified asthma, uncomplicated: Secondary | ICD-10-CM | POA: Diagnosis not present

## 2021-08-29 DIAGNOSIS — M810 Age-related osteoporosis without current pathological fracture: Secondary | ICD-10-CM | POA: Diagnosis not present

## 2021-08-29 DIAGNOSIS — E039 Hypothyroidism, unspecified: Secondary | ICD-10-CM | POA: Diagnosis not present

## 2021-08-29 DIAGNOSIS — I10 Essential (primary) hypertension: Secondary | ICD-10-CM | POA: Diagnosis not present

## 2021-08-29 DIAGNOSIS — K219 Gastro-esophageal reflux disease without esophagitis: Secondary | ICD-10-CM | POA: Diagnosis not present

## 2021-08-31 ENCOUNTER — Ambulatory Visit (HOSPITAL_COMMUNITY)
Admission: RE | Admit: 2021-08-31 | Discharge: 2021-08-31 | Disposition: A | Discharge status: Home / Self Care | Payer: Medicare Other | Source: Ambulatory Visit | Attending: Gastroenterology | Admitting: Gastroenterology

## 2021-08-31 ENCOUNTER — Other Ambulatory Visit: Payer: Self-pay

## 2021-08-31 DIAGNOSIS — R1310 Dysphagia, unspecified: Secondary | ICD-10-CM | POA: Diagnosis not present

## 2021-08-31 DIAGNOSIS — R059 Cough, unspecified: Secondary | ICD-10-CM | POA: Diagnosis not present

## 2021-08-31 DIAGNOSIS — K219 Gastro-esophageal reflux disease without esophagitis: Secondary | ICD-10-CM | POA: Diagnosis not present

## 2021-08-31 NOTE — Progress Notes (Signed)
Objective Swallowing Evaluation: Type of Study: MBS-Modified Barium Swallow Study   Patient Details  Name: Daisy [name] MRN: [id number] Date of Birth: 11-26-1951  Today's Date: 08/31/2021 Time: SLP Start Time (ACUTE ONLY): 1310 -SLP Stop Time (ACUTE ONLY): 1400  SLP Time Calculation (min) (ACUTE ONLY): 50 min   Past Medical History:  Past Medical History:  Diagnosis Date   Addison's disease ([place])    Adenocarcinoma of breast ([place]) 07/13/1992   LEFT LUMPECTOMY AND CHEMO AND RADIATION;   NOW HAS RIGHT BREAST MASS 09/25/14 - PLANS MASTECTOMY    Adrenal insufficiency ([place])    "w/secondary Addison's"   Anemia    Arthritis    Asthma    using incentive spirometer alot now   Brittle bone disease    Bruises easily    Chronic bronchitis    Chronic pain    DR. MARK PHILLIPS WITH PAIN CLINIC -  CHRONIC BACK PAIN AND SCOLIOSIS AND PREVIOUS BACK SURGERY AND NOW LUMBAR FRACTURE - PT HAS [employer] SPINAL STIMULATOR WHICH SHE CAN TURN ON AND OFF ; PT ON MORPINE AND CLONAZEPAM AND ROBAXIN   COPD (chronic obstructive pulmonary disease) ([place])    DDD (degenerative disc disease), lumbar    Eye pain, right    last 4 months can be both eyes   Falls frequently    Family history of adverse reaction to anesthesia    ponv   Fibromyalgia    Fracture    LUMBAR 1 - FROM A FALL JUNE 2015 - PT HAS A LOT OF SEVERE BACK PAIN AND DIFFICULTY WALKING   GERD (gastroesophageal reflux disease)    H/O esophageal spasm    "I can have as many as 49 TUMS before they stop"   H/O hiatal hernia    Head injury with loss of consciousness ([place])    Headache(784.0)    "when I don't eat" (02/19/12)   Hepatitis B infection 1971   "in high school; after drinking after football player"   History of kidney stones    1 large stone now   History of urinary urgency    AZO HELPS - DOES TURN URINE ORANGE; DR. Risa Grow IS PT'S UROLOGIST   Hypertension    Hypothyroidism    Mass    right chest wall   Migraines    "much better  now" (02/19/12)   Multiple sclerosis ([place]) 05/1988   Relasping and Remitting"   OSA (obstructive sleep apnea)    Paget's disease of breast ([place]) 2010   left   Pneumonia    PONV (postoperative nausea and vomiting)    one episode 1993 vomiting after surgery   Poor venous access    PT STATES HER PORT A CATH NOT TO BE USED FOR SURGERY/ IV'S   Positive TB test 2017   "ran deep in my father's family; went on INH for awhile; it caused migraines"   Radiation adverse effect    "100% blockage on left; 50% on right salivary; can drain w/massage"   Restless leg syndrome, controlled 02/19/12   "much better since being on CPAP; been on that ~ 1 month now"   Shingles    Shortness of breath dyspnea    with exertion, improved, asthma improved   Sleep apnea    no cpap used due to pt took off all the time    Spinal headache    Stress incontinence in female    Upper GI bleeding 1970-1980   "had 4"   Past Surgical History:  Past  Surgical History:  Procedure Laterality Date   ABDOMINAL HYSTERECTOMY  1987   APPENDECTOMY  1960's   BACK SURGERY  ~ 2004   "chipped disk out to put in permanent stimulator"   BALLOON DILATION N/A 04/23/2020   Procedure: BALLOON DILATION;  Surgeon: Ronnette Juniper, MD;  Location: [facility];  Service: Gastroenterology;  Laterality: N/A;   BALLOON DILATION N/A 02/11/2021   Procedure: BALLOON DILATION;  Surgeon: Ronnette Juniper, MD;  [facility]: [facility];  Service: Gastroenterology;  Laterality: N/A;   [place]   BIOPSY  06/11/2018   Procedure: BIOPSY;  Surgeon: Ronnette Juniper, MD;  [facility];  Service: Gastroenterology;;   BIOPSY  12/05/2018   Procedure: BIOPSY;  Surgeon: Ronnette Juniper, MD;  [facility];  Service: Gastroenterology;;   BIOPSY  04/23/2020   Procedure: BIOPSY;  Surgeon: Ronnette Juniper, MD;  [facility];  Service: Gastroenterology;;   BIOPSY  02/11/2021   Procedure: BIOPSY;  Surgeon: Ronnette Juniper, MD;  [facility];  Service: Gastroenterology;;   BOTOX INJECTION N/A 11/26/2017   Procedure: BOTOX INJECTION;  Surgeon: Ronnette Juniper, MD;  Location: [place];  Service: Gastroenterology;  Laterality: N/A;   BOTOX INJECTION N/A 06/11/2018   Procedure: BOTOX INJECTION;  Surgeon: Ronnette Juniper, MD;  [facility];  Service: Gastroenterology;  Laterality: N/A;   BOTOX INJECTION  12/05/2018   Procedure: BOTOX INJECTION;  Surgeon: Ronnette Juniper, MD;  [facility];  Service: Gastroenterology;;   BOTOX INJECTION  09/09/2019   Procedure: BOTOX INJECTION;  Surgeon: Clarene Essex, MD;  Location: [place];  Service: Endoscopy;;   BOTOX INJECTION N/A 04/23/2020   Procedure: BOTOX INJECTION;  Surgeon: Ronnette Juniper, MD;  [facility];  Service: Gastroenterology;  Laterality: N/A;   BOTOX INJECTION N/A 02/11/2021   Procedure: BOTOX INJECTION;  Surgeon: Ronnette Juniper, MD;  [facility];  Service: Gastroenterology;  Laterality: N/A;   BREAST LUMPECTOMY  07/13/1992   left    CHOLECYSTECTOMY  1980's   COLONOSCOPY     CYSTOSCOPY WITH RETROGRADE PYELOGRAM, URETEROSCOPY AND STENT PLACEMENT Right 09/29/2015   Procedure: CYSTOSCOPY WITH RIGHT RETROGRADE PYELOGRAM, URETEROSCOPY AND STENT PLACEMENT, STONE EXTRACTION WITH BASKET;  Surgeon: Rana Snare, MD;  [facility];  Service: Urology;  Laterality: Right;   ESOPHAGEAL MANOMETRY N/A 10/03/2017   Procedure: ESOPHAGEAL MANOMETRY (EM);  Surgeon: Ronnette Juniper, MD;  [facility];  Service: Gastroenterology;  Laterality: N/A;   ESOPHAGOGASTRODUODENOSCOPY N/A 04/17/2014   Procedure: ESOPHAGOGASTRODUODENOSCOPY (EGD);  Surgeon: Missy Sabins, MD;  [facility];  Service: Endoscopy;  Laterality: N/A;   ESOPHAGOGASTRODUODENOSCOPY N/A 11/26/2017   Procedure: ESOPHAGOGASTRODUODENOSCOPY (EGD);  Surgeon: Ronnette Juniper, MD;  Location: [place];  Service: Gastroenterology;  Laterality: N/A;    ESOPHAGOGASTRODUODENOSCOPY N/A 06/11/2018   Procedure: ESOPHAGOGASTRODUODENOSCOPY (EGD);  Surgeon: Ronnette Juniper, MD;  Location[facility];  Service: Gastroenterology;  Laterality: N/A;   ESOPHAGOGASTRODUODENOSCOPY N/A 12/05/2018   Procedure: ESOPHAGOGASTRODUODENOSCOPY (EGD);  Surgeon: Ronnette Juniper, MD;  Location[facility];  Service: Gastroenterology;  Laterality: N/A;  w/ botox injection   ESOPHAGOGASTRODUODENOSCOPY (EGD) WITH PROPOFOL N/A 09/09/2019   Procedure: ESOPHAGOGASTRODUODENOSCOPY (EGD) WITH PROPOFOL;  Surgeon: Clarene Essex, MD;  Location: [place];  Service: Endoscopy;  Laterality: N/A;  With probable Botox and doubtful dilation   ESOPHAGOGASTRODUODENOSCOPY (EGD) WITH PROPOFOL N/A 04/23/2020   Procedure: ESOPHAGOGASTRODUODENOSCOPY (EGD) WITH PROPOFOL;  Surgeon: Ronnette Juniper, MD;  [facility];  Service: Gastroenterology;  Laterality: N/A;   ESOPHAGOGASTRODUODENOSCOPY (EGD)  WITH PROPOFOL N/A 02/11/2021   Procedure: ESOPHAGOGASTRODUODENOSCOPY (EGD) WITH PROPOFOL;  Surgeon: Ronnette Juniper, MD;  [facility];  Service: Gastroenterology;  Laterality: N/A;   EXTRACORPOREAL SHOCK WAVE LITHOTRIPSY  2016   fracture Left 03/2018   left foot fracture   HOLMIUM LASER APPLICATION Right 87/68/1157   Procedure: HOLMIUM LASER LITHOTRIPSY ;  Surgeon: Rana Snare, MD;  [facility];  Service: Urology;  Laterality: Right;   LAPAROSCOPY     MASS EXCISION Right 07/24/2018   Procedure: EXCISION OF RIGHT CHEST WALL MASS ERAS PATHWAY;  Surgeon: Coralie Keens, MD;  [facility];  Service: General;  Laterality: Right;   MASTECTOMY  2010   left    MASTECTOMY Right 10/15   PORT-A-CATH REMOVAL Right 09/25/2016   Procedure: REMOVAL PORT-A-CATH RIGHT;  Surgeon: Jackolyn Confer, MD;  Location: [place];  Service: General;  Laterality: Right;   PORTACATH PLACEMENT  10/30/2012   Procedure: INSERTION PORT-A-CATH;  Surgeon: Odis Hollingshead, MD;  [facility];  Service: General;   Laterality: Right;  Right Ultrasound guided port-a-cath insertion   SALIVARY GLAND SURGERY  ~ 2002   "left was 100% blocked twice; right 50%; only had one surgery"   SALIVARY GLAND SURGERY LEFT     SAVORY DILATION N/A 04/17/2014   Procedure: SAVORY DILATION;  Surgeon: Missy Sabins, MD;  [facility];  Service: Endoscopy;  Laterality: N/A;   SPINAL CORD STIMULATOR INSERTION  ~ 2004   SPINAL CORD STIMULATOR INSERTION N/A 04/09/2015   Procedure: Spinal cord stimulator revision and replacement of paddle electrode;  Surgeon: Kristeen Miss, MD;  Location: [place] [facility];  Service: Neurosurgery;  Laterality: N/A;  Spinal cord stimulator revision and replacement of paddle electrode   TONSILLECTOMY AND ADENOIDECTOMY  1964   TOTAL MASTECTOMY Right 09/25/2014   Procedure: RIGHT MASTECTOMY;  Surgeon: Jackolyn Confer, MD;  [facility];  Service: General;  Laterality: Right;   HPI: 70 y.o. female referred by Dr. Clovis Cao in [place] for OP MBS.  PMHx Addison's dz, chronic pain, fibromyalgia,  GERD, esophageal spasm, C2-3 fusion, relaspsing remitting MS, HTN, breast cancer s/p bilateral mastectomy.  Long hx of esophageal dysphagia with stenosis, dysmotility, and Esophagitis Dissecans Superficialis (sloughing of the esophageal mucosa).  Has had multiple botox injections (of no benefit), EGDs and dilatations,  most recent EGD 08/18/21: moderate stenosis lower third of esophagus (1 cm inner diameter x 2 cm length) and mild stenosis at pylorus, both dilated. Pt is a good historian. She discusses symptoms of vomiting/regurgitation, near constant nausea, avoidance of hard breads/meats.  She eats standing to facilitate transfer through the esophagus. She reports chronic globus.   Subjective: good historian    Assessment / Plan / Recommendation  [facility] IP CLINICAL IMPRESSIONS 08/31/2021  Clinical Impression Pt presents with normal oropharyngeal swallow with adequate mastication, tongue propulsion, pharyngeal  squeeze, and laryngeal vestibule closure.  No penetration nor aspiration observed. Good patency of UES.  Brief esophageal sweeps revealed transfer of thin barium and soft solids.  There was no backflow through the UES - however, quantities of barium were not equal to what might be consumed during a meal. We discussed the purpose of this exam in assessing oropharyngeal swallow, and that an esophagram/barium swallow may offer more useful information about current motility of esophagus.  We discussed the absence of stasis above the UES, and that Ms. Cathy's sensation of a "glob of something" in the throat is more likely attributable to retention in the esophagus.  Ms.  Skilling viewed the video in real time. We discussed basic behavioral modifications to assist with comfort during meals, all of which she is practicing. Recommend continuing current diet as tolerated.  No further recommendations offered.  SLP Visit Diagnosis Dysphagia, unspecified (R13.10)  Attention and concentration deficit following --  Frontal lobe and executive function deficit following --  Impact on safety and function --      Bryahna Lesko L. Tivis Ringer, [place] CCC/SLP Acute Rehabilitation Services Office number [phone] Pager [phone]                                 Juan Quam Laurice 08/31/2021, 3:01 PM

## 2021-09-06 ENCOUNTER — Other Ambulatory Visit: Payer: Medicare Other

## 2021-09-22 DIAGNOSIS — E039 Hypothyroidism, unspecified: Secondary | ICD-10-CM | POA: Diagnosis not present

## 2021-09-22 DIAGNOSIS — E230 Hypopituitarism: Secondary | ICD-10-CM | POA: Diagnosis not present

## 2021-09-29 ENCOUNTER — Other Ambulatory Visit: Payer: Medicare Other

## 2021-10-12 DIAGNOSIS — Z20822 Contact with and (suspected) exposure to covid-19: Secondary | ICD-10-CM | POA: Diagnosis not present

## 2021-10-12 DIAGNOSIS — R059 Cough, unspecified: Secondary | ICD-10-CM | POA: Diagnosis not present

## 2021-10-12 DIAGNOSIS — R509 Fever, unspecified: Secondary | ICD-10-CM | POA: Diagnosis not present

## 2021-10-12 DIAGNOSIS — G894 Chronic pain syndrome: Secondary | ICD-10-CM | POA: Diagnosis not present

## 2021-10-12 DIAGNOSIS — Z79891 Long term (current) use of opiate analgesic: Secondary | ICD-10-CM | POA: Diagnosis not present

## 2021-10-12 DIAGNOSIS — N390 Urinary tract infection, site not specified: Secondary | ICD-10-CM | POA: Diagnosis not present

## 2021-10-12 DIAGNOSIS — M4726 Other spondylosis with radiculopathy, lumbar region: Secondary | ICD-10-CM | POA: Diagnosis not present

## 2021-10-12 DIAGNOSIS — M41115 Juvenile idiopathic scoliosis, thoracolumbar region: Secondary | ICD-10-CM | POA: Diagnosis not present

## 2021-10-14 ENCOUNTER — Emergency Department (HOSPITAL_COMMUNITY)
Admission: EM | Admit: 2021-10-14 | Discharge: 2021-10-15 | Disposition: A | Discharge status: Home / Self Care | Payer: Medicare Other | Attending: Emergency Medicine | Admitting: Emergency Medicine

## 2021-10-14 ENCOUNTER — Emergency Department (HOSPITAL_COMMUNITY): Payer: Medicare Other

## 2021-10-14 ENCOUNTER — Encounter (HOSPITAL_COMMUNITY): Payer: Self-pay

## 2021-10-14 DIAGNOSIS — D7389 Other diseases of spleen: Secondary | ICD-10-CM | POA: Diagnosis not present

## 2021-10-14 DIAGNOSIS — R1032 Left lower quadrant pain: Secondary | ICD-10-CM | POA: Diagnosis not present

## 2021-10-14 DIAGNOSIS — N200 Calculus of kidney: Secondary | ICD-10-CM | POA: Diagnosis not present

## 2021-10-14 DIAGNOSIS — R112 Nausea with vomiting, unspecified: Secondary | ICD-10-CM | POA: Insufficient documentation

## 2021-10-14 DIAGNOSIS — Z79899 Other long term (current) drug therapy: Secondary | ICD-10-CM | POA: Diagnosis not present

## 2021-10-14 DIAGNOSIS — E039 Hypothyroidism, unspecified: Secondary | ICD-10-CM | POA: Diagnosis not present

## 2021-10-14 DIAGNOSIS — J449 Chronic obstructive pulmonary disease, unspecified: Secondary | ICD-10-CM | POA: Insufficient documentation

## 2021-10-14 DIAGNOSIS — R1084 Generalized abdominal pain: Secondary | ICD-10-CM | POA: Insufficient documentation

## 2021-10-14 DIAGNOSIS — R911 Solitary pulmonary nodule: Secondary | ICD-10-CM | POA: Diagnosis not present

## 2021-10-14 DIAGNOSIS — K838 Other specified diseases of biliary tract: Secondary | ICD-10-CM | POA: Diagnosis not present

## 2021-10-14 DIAGNOSIS — J45909 Unspecified asthma, uncomplicated: Secondary | ICD-10-CM | POA: Diagnosis not present

## 2021-10-14 DIAGNOSIS — I10 Essential (primary) hypertension: Secondary | ICD-10-CM | POA: Insufficient documentation

## 2021-10-14 DIAGNOSIS — M419 Scoliosis, unspecified: Secondary | ICD-10-CM | POA: Diagnosis not present

## 2021-10-14 DIAGNOSIS — E860 Dehydration: Secondary | ICD-10-CM | POA: Diagnosis not present

## 2021-10-14 LAB — COMPREHENSIVE METABOLIC PANEL
ALT: 10 U/L (ref 0–44)
AST: 19 U/L (ref 15–41)
Albumin: 4.2 g/dL (ref 3.5–5.0)
Alkaline Phosphatase: 40 U/L (ref 38–126)
Anion gap: 10 (ref 5–15)
BUN: 11 mg/dL (ref 8–23)
CO2: 26 mmol/L (ref 22–32)
Calcium: 9.9 mg/dL (ref 8.9–10.3)
Chloride: 101 mmol/L (ref 98–111)
Creatinine, Ser: 0.73 mg/dL (ref 0.44–1.00)
GFR, Estimated: >60 mL/min (ref >60)
Glucose, Bld: 108 mg/dL — ABNORMAL HIGH (ref 70–99)
Potassium: 4.2 mmol/L (ref 3.5–5.1)
Sodium: 137 mmol/L (ref 135–145)
Total Bilirubin: 0.6 mg/dL (ref 0.3–1.2)
Total Protein: 7.6 g/dL (ref 6.5–8.1)

## 2021-10-14 LAB — URINALYSIS, ROUTINE W REFLEX MICROSCOPIC
Bilirubin Urine: NEGATIVE
Glucose, UA: NEGATIVE mg/dL
Hgb urine dipstick: NEGATIVE
Ketones, ur: 20 mg/dL — AB
Leukocytes,Ua: NEGATIVE
Nitrite: NEGATIVE
Protein, ur: NEGATIVE mg/dL
Specific Gravity, Urine: 1.015 (ref 1.005–1.030)
pH: 6 (ref 5.0–8.0)

## 2021-10-14 LAB — CBC
HCT: 39.2 % (ref 36.0–46.0)
Hemoglobin: 12.6 g/dL (ref 12.0–15.0)
MCH: 27.4 pg (ref 26.0–34.0)
MCHC: 32.1 g/dL (ref 30.0–36.0)
MCV: 85.2 fL (ref 80.0–100.0)
Platelets: 262 10*3/uL (ref 150–400)
RBC: 4.6 MIL/uL (ref 3.87–5.11)
RDW: 16.4 % — ABNORMAL HIGH (ref 11.5–15.5)
WBC: 5.6 10*3/uL (ref 4.0–10.5)
nRBC: 0 % (ref 0.0–0.2)

## 2021-10-14 LAB — LIPASE, BLOOD: Lipase: 20 U/L (ref 11–51)

## 2021-10-14 MED ORDER — ONDANSETRON HCL 4 MG/2ML IJ SOLN
4.0000 mg | Freq: Once | INTRAMUSCULAR | Status: AC
  Administered 2021-10-14: 4 mg via INTRAVENOUS
  Filled 2021-10-14: qty 2

## 2021-10-14 MED ORDER — SODIUM CHLORIDE 0.9 % IV BOLUS
1000.0000 mL | Freq: Once | INTRAVENOUS | Status: AC
  Administered 2021-10-14: 1000 mL via INTRAVENOUS

## 2021-10-14 MED ORDER — ONDANSETRON 8 MG PO TBDP: 8.0000 mg | ORAL_TABLET | Freq: Three times a day (TID) | ORAL | 0 refills | Status: DC | PRN

## 2021-10-14 MED ORDER — MORPHINE SULFATE (PF) 4 MG/ML IV SOLN
4.0000 mg | Freq: Once | INTRAVENOUS | Status: AC
  Administered 2021-10-14: 4 mg via INTRAVENOUS
  Filled 2021-10-14: qty 1

## 2021-10-14 NOTE — Discharge Instructions (Addendum)
It was our pleasure to provide your ER care today - we hope that you feel better.  Drink plenty of fluids/make sure to stay well hydrated. Take zofran as need for nausea.  Follow up with primary care doctor this coming week if symptoms fail to improve/resolve.  Return to ER if worse, new symptoms, fevers, worsening/severe pain, persistent vomiting, or other concern.   You were given pain meds in the ER - no driving for the next 6 hours.

## 2021-10-14 NOTE — ED Provider Notes (Signed)
Emergency Medicine Provider Triage Evaluation Note  Daisy [name] , a 70 y.o. female  was evaluated in triage.  Pt complains of flank and abdominal pain.  Pt seen at urgent care and sent here for evaltuion of possible dehydration and diverticulitis.  Pt reports she has a past medical history of a sloth ing esophagus followed at [facility]  Review of Systems  Positive: Fever vomiting Negative: No uti symptoms  Physical Exam  BP 140/87 (BP Location: Left Arm)   Pulse 90   Temp 98.2 F (36.8 C) (Oral)   Resp 20   SpO2 100%  Gen:   Awake, no distress   Resp:  Normal effort  MSK:   Moves extremities without difficulty  Other:  Tender left lower abdomen   Medical Decision Making  Medically screening exam initiated at 4:27 PM.  Appropriate orders placed.  Daisy [name] was informed that the remainder of the evaluation will be completed by another provider, this initial triage assessment does not replace that evaluation, and the importance of remaining in the ED until their evaluation is complete.     Fransico Meadow, [place] 10/14/21 1629    Hayden Rasmussen, MD 10/14/21 [phone]

## 2021-10-14 NOTE — ED Provider Notes (Signed)
[facility] DEPT Provider Note   CSN: [id number] Arrival date & time: 10/14/21  1513     History Chief Complaint  Patient presents with   Abdominal Pain    Daisy [name] is a 70 y.o. female.  Patient c/o generalized abd pain/cramping, and nausea/vomiting, in past couple days. Symptoms acute onset, moderate, dull and cramping, moderate, persistent, non radiating, without specific exacerbating or alleviating factors. Emesis not bloody or bilious. Last normal bm a couple days ago. Denies abd distension. No dysuria or hematuria. No vaginal discharge or bleeding. Multiple prior abd surgeries including appendectomy, hysterectomy, and cholecystectomy. No hx pancreatitis. Denies fever/chills. No chest pain or sob. No cough or uri symptoms.   The history is provided by the patient, the spouse and medical records.  Abdominal Pain Associated symptoms: nausea and vomiting   Associated symptoms: no chest pain, no chills, no cough, no dysuria, no fever, no shortness of breath and no sore throat       Past Medical History:  Diagnosis Date   Addison's disease ([place])    Adenocarcinoma of breast ([place]) 07/13/1992   LEFT LUMPECTOMY AND CHEMO AND RADIATION;   NOW HAS RIGHT BREAST MASS 09/25/14 - PLANS MASTECTOMY    Adrenal insufficiency (H[facility])    "w/secondary Addison's"   Anemia    Arthritis    Asthma    using incentive spirometer alot now   Brittle bone disease    Bruises easily    Chronic bronchitis    Chronic pain    DR. MARK PHILLIPS WITH PAIN CLINIC -  CHRONIC BACK PAIN AND SCOLIOSIS AND PREVIOUS BACK SURGERY AND NOW LUMBAR FRACTURE - PT HAS [employer] SPINAL STIMULATOR WHICH SHE CAN TURN ON AND OFF ; PT ON MORPINE AND CLONAZEPAM AND ROBAXIN   COPD (chronic obstructive pulmonary disease) ([place])    DDD (degenerative disc disease), lumbar    Eye pain, right    last 4 months can be both eyes   Falls frequently    Family history of adverse reaction to anesthesia     ponv   Fibromyalgia    Fracture    LUMBAR 1 - FROM A FALL JUNE 2015 - PT HAS A LOT OF SEVERE BACK PAIN AND DIFFICULTY WALKING   GERD (gastroesophageal reflux disease)    H/O esophageal spasm    "I can have as many as 62 TUMS before they stop"   H/O hiatal hernia    Head injury with loss of consciousness ([place])    Headache(784.0)    "when I don't eat" (02/19/12)   Hepatitis B infection 1971   "in high school; after drinking after football player"   History of kidney stones    1 large stone now   History of urinary urgency    AZO HELPS - DOES TURN URINE ORANGE; DR. Risa Peer IS PT'S UROLOGIST   Hypertension    Hypothyroidism    Mass    right chest wall   Migraines    "much better now" (02/19/12)   Multiple sclerosis ([place]) 05/1988   Relasping and Remitting"   OSA (obstructive sleep apnea)    Paget's disease of breast ([place]) 2010   left   Pneumonia    PONV (postoperative nausea and vomiting)    one episode 1993 vomiting after surgery   Poor venous access    PT STATES HER PORT A CATH NOT TO BE USED FOR SURGERY/ IV'S   Positive TB test 2017   "ran deep in my father's family;  went on INH for awhile; it caused migraines"   Radiation adverse effect    "100% blockage on left; 50% on right salivary; can drain w/massage"   Restless leg syndrome, controlled 02/19/12   "much better since being on CPAP; been on that ~ 1 month now"   Shingles    Shortness of breath dyspnea    with exertion, improved, asthma improved   Sleep apnea    no cpap used due to pt took off all the time    Spinal headache    Stress incontinence in female    Upper GI bleeding 1970-1980   "had 4"    Patient Active Problem List   Diagnosis Date Noted   Esophageal dysmotility    Pericarditis 09/06/2019   Sepsis ([place]) 09/06/2019   Chest pain 09/06/2019   Back pain 10/12/2017   Degenerative disc disease, lumbar 10/12/2017   Compression fracture of first lumbar vertebra ([place]) 10/12/2017   GERD (gastroesophageal  reflux disease) 10/12/2017   Acute encephalopathy 04/19/2016   Hypokalemia 04/19/2016   Confusion 04/18/2016   Nephrolithiasis 09/29/2015   Portacath in place 09/07/2015   Herpes zoster 09/07/2015   Spinal cord stimulator dysfunction ([place]) 04/09/2015   History of left breast cancer 09/25/2014   Pyelonephritis 05/17/2014   Acute pyelonephritis 05/17/2014   Abdominal pain, left lower quadrant 05/17/2014   Nausea & vomiting 10/13/2012   Iron deficiency anemia 06/18/2012   DCIS (ductal carcinoma in situ) of breast 03/22/2012   Abdominal pain 02/19/2012   Addison's disease ([place])    Hypertension    Hypothyroidism    Shingles    OBESITY 05/20/2008    Past Surgical History:  Procedure Laterality Date   ABDOMINAL HYSTERECTOMY  1987   APPENDECTOMY  1960's   BACK SURGERY  ~ 2004   "chipped disk out to put in permanent stimulator"   BALLOON DILATION N/A 04/23/2020   Procedure: BALLOON DILATION;  Surgeon: Ronnette Juniper, MD;  Location: [facility];  Service: Gastroenterology;  Laterality: N/A;   BALLOON DILATION N/A 02/11/2021   Procedure: BALLOON DILATION;  Surgeon: Ronnette Juniper, MD;  Location: [facility];  Service: Gastroenterology;  Laterality: N/A;   [place]   BIOPSY  06/11/2018   Procedure: BIOPSY;  Surgeon: Ronnette Juniper, MD;  [facility]: [facility];  Service: Gastroenterology;;   BIOPSY  12/05/2018   Procedure: BIOPSY;  Surgeon: Ronnette Juniper, MD;  [facility];  Service: Gastroenterology;;   BIOPSY  04/23/2020   Procedure: BIOPSY;  Surgeon: Ronnette Juniper, MD;  [facility];  Service: Gastroenterology;;   BIOPSY  02/11/2021   Procedure: BIOPSY;  Surgeon: Ronnette Juniper, MD;  [facility];  Service: Gastroenterology;;   BOTOX INJECTION N/A 11/26/2017   Procedure: BOTOX INJECTION;  Surgeon: Ronnette Juniper, MD;  Location: [facility];  Service: Gastroenterology;  Laterality: N/A;   BOTOX INJECTION N/A 06/11/2018    Procedure: BOTOX INJECTION;  Surgeon: Ronnette Juniper, MD;  [facility];  Service: Gastroenterology;  Laterality: N/A;   BOTOX INJECTION  12/05/2018   Procedure: BOTOX INJECTION;  Surgeon: Ronnette Juniper, MD;  [facility];  Service: Gastroenterology;;   BOTOX INJECTION  09/09/2019   Procedure: BOTOX INJECTION;  Surgeon: Clarene Essex, MD;  Location: [facility];  Service: Endoscopy;;   BOTOX INJECTION N/A 04/23/2020   Procedure: BOTOX INJECTION;  Surgeon: Ronnette Juniper, MD;  [facility];  Service: Gastroenterology;  Laterality: N/A;   BOTOX INJECTION N/A 02/11/2021   Procedure: BOTOX  INJECTION;  Surgeon: Ronnette Juniper, MD;  Location: [facility];  Service: Gastroenterology;  Laterality: N/A;   BREAST LUMPECTOMY  07/13/1992   left    CHOLECYSTECTOMY  1980's   COLONOSCOPY     CYSTOSCOPY WITH RETROGRADE PYELOGRAM, URETEROSCOPY AND STENT PLACEMENT Right 09/29/2015   Procedure: CYSTOSCOPY WITH RIGHT RETROGRADE PYELOGRAM, URETEROSCOPY AND STENT PLACEMENT, STONE EXTRACTION WITH BASKET;  Surgeon: Rana Snare, MD;  [facility];  Service: Urology;  Laterality: Right;   ESOPHAGEAL MANOMETRY N/A 10/03/2017   Procedure: ESOPHAGEAL MANOMETRY (EM);  Surgeon: Ronnette Juniper, MD;  [facility];  Service: Gastroenterology;  Laterality: N/A;   ESOPHAGOGASTRODUODENOSCOPY N/A 04/17/2014   Procedure: ESOPHAGOGASTRODUODENOSCOPY (EGD);  Surgeon: Missy Sabins, MD;  Location: [facility];  Service: Endoscopy;  Laterality: N/A;   ESOPHAGOGASTRODUODENOSCOPY N/A 11/26/2017   Procedure: ESOPHAGOGASTRODUODENOSCOPY (EGD);  Surgeon: Ronnette Juniper, MD;  Location: [facility];  Service: Gastroenterology;  Laterality: N/A;   ESOPHAGOGASTRODUODENOSCOPY N/A 06/11/2018   Procedure: ESOPHAGOGASTRODUODENOSCOPY (EGD);  Surgeon: Ronnette Juniper, MD;  Location: [facility];  Service: Gastroenterology;  Laterality: N/A;   ESOPHAGOGASTRODUODENOSCOPY N/A 12/05/2018   Procedure: ESOPHAGOGASTRODUODENOSCOPY (EGD);   Surgeon: Ronnette Juniper, MD;  Location: [facility];  Service: Gastroenterology;  Laterality: N/A;  w/ botox injection   ESOPHAGOGASTRODUODENOSCOPY (EGD) WITH PROPOFOL N/A 09/09/2019   Procedure: ESOPHAGOGASTRODUODENOSCOPY (EGD) WITH PROPOFOL;  Surgeon: Clarene Essex, MD;  Location: [facility];  Service: Endoscopy;  Laterality: N/A;  With probable Botox and doubtful dilation   ESOPHAGOGASTRODUODENOSCOPY (EGD) WITH PROPOFOL N/A 04/23/2020   Procedure: ESOPHAGOGASTRODUODENOSCOPY (EGD) WITH PROPOFOL;  Surgeon: Ronnette Juniper, MD;  [facility];  Service: Gastroenterology;  Laterality: N/A;   ESOPHAGOGASTRODUODENOSCOPY (EGD) WITH PROPOFOL N/A 02/11/2021   Procedure: ESOPHAGOGASTRODUODENOSCOPY (EGD) WITH PROPOFOL;  Surgeon: Ronnette Juniper, MD;  [facility];  Service: Gastroenterology;  Laterality: N/A;   EXTRACORPOREAL SHOCK WAVE LITHOTRIPSY  2016   fracture Left 03/2018   left foot fracture   HOLMIUM LASER APPLICATION Right 78/46/9629   Procedure: HOLMIUM LASER LITHOTRIPSY ;  Surgeon: Rana Snare, MD;  [facility];  Service: Urology;  Laterality: Right;   LAPAROSCOPY     MASS EXCISION Right 07/24/2018   Procedure: EXCISION OF RIGHT CHEST WALL MASS ERAS PATHWAY;  Surgeon: Coralie Keens, MD;  [facility];  Service: General;  Laterality: Right;   MASTECTOMY  2010   left    MASTECTOMY Right 10/15   PORT-A-CATH REMOVAL Right 09/25/2016   Procedure: REMOVAL PORT-A-CATH RIGHT;  Surgeon: Jackolyn Confer, MD;  Location: [facility];  Service: General;  Laterality: Right;   PORTACATH PLACEMENT  10/30/2012   Procedure: INSERTION PORT-A-CATH;  Surgeon: Odis Hollingshead, MD;  Location: [facility];  Service: General;  Laterality: Right;  Right Ultrasound guided port-a-cath insertion   SALIVARY GLAND SURGERY  ~ 2002   "left was 100% blocked twice; right 50%; only had one surgery"   SALIVARY GLAND SURGERY LEFT     SAVORY DILATION N/A 04/17/2014   Procedure: SAVORY DILATION;  Surgeon: Missy Sabins, MD;  [facility];  Service: Endoscopy;  Laterality: N/A;   SPINAL CORD STIMULATOR INSERTION  ~ 2004   SPINAL CORD STIMULATOR INSERTION N/A 04/09/2015   Procedure: Spinal cord stimulator revision and replacement of paddle electrode;  Surgeon: Kristeen Miss, MD;  Location: [facility];  Service: Neurosurgery;  Laterality: N/A;  Spinal cord stimulator revision and replacement of paddle electrode   TONSILLECTOMY AND ADENOIDECTOMY  1964   TOTAL MASTECTOMY Right 09/25/2014   Procedure: RIGHT MASTECTOMY;  Surgeon: Sherren Mocha  Rosenbower, MD;  [facility];  Service: General;  Laterality: Right;     OB History   No obstetric history on file.     Family History  Problem Relation Age of Onset   Lung cancer Paternal Uncle        smoker   Breast cancer Cousin 57       maternal cousin   Throat cancer Cousin        maternal cousin    Social History   Tobacco Use   Smoking status: Never   Smokeless tobacco: Never  Vaping Use   Vaping Use: Never used  Substance Use Topics   Alcohol use: No   Drug use: No    Home Medications Prior to Admission medications   Medication Sig Start Date End Date Taking? Authorizing Provider  amLODipine (NORVASC) 2.5 MG tablet Take 2.5 mg by mouth in the morning and at bedtime.    [provider]  Calcium-Vitamin D-Vitamin K 606-301-60 MG-UNT-MCG CHEW Chew 3 each by mouth daily.    [provider]  Carboxymethylcellulose Sod PF (LUBRICANT EYE DROPS PF) 0.5 % SOLN Apply 1-2 drops to eye 3 (three) times daily as needed (to lubricate the eyes).    [provider]  cyanocobalamin (,VITAMIN B-12,) 1000 MCG/ML injection Inject 1,000 mcg into the muscle every 30 (thirty) days.    [provider]  levothyroxine (SYNTHROID, LEVOTHROID) 50 MCG tablet Take 50-75 mcg by mouth See admin instructions. Take 50 mcg by mouth in the morning "on even-numbered days" and alternate with 75 mcg on "odd-numbered days"    [provider]  losartan (COZAAR) 100 MG tablet Take 100 mg by mouth every morning. Take 100 mg by mouth in the morning 08/03/17   [provider]  morphine (MSIR) 30 MG tablet Take 30 mg by mouth every 8 (eight) hours.    [provider]  ondansetron (ZOFRAN-ODT) 4 MG disintegrating tablet Take 4 mg by mouth every 8 (eight) hours as needed for nausea or vomiting.    [provider]  pantoprazole (PROTONIX) 40 MG tablet Take 40 mg by mouth daily before breakfast.     [provider]  predniSONE (DELTASONE) 1 MG tablet Take 4 mg by mouth daily with breakfast.    [provider]  senna-docusate (SENOKOT-S) 8.6-50 MG tablet Take 1 tablet 2 (two) times daily by mouth. Patient taking differently: Take 1 tablet by mouth in the morning, at noon, and at bedtime. 10/18/17   Florencia Reasons, MD  valACYclovir (VALTREX) 1000 MG tablet Take 1,000 mg by mouth 3 (three) times daily as needed (for shingles outbreaks).     [provider]  spironolactone (ALDACTONE) 25 MG tablet Take 25 mg by mouth 2 (two) times daily. For swelling   02/19/12  [provider]    Allergies    Alendronate, Gabapentin, Iodinated diagnostic agents, Iodine-131, Iohexol, Procaine, Sulfa antibiotics, Terbinafine hcl, Terbinafine hcl, Amlodipine, Amoxicillin-pot clavulanate, Clarithromycin, Clindamycin/lincomycin, Compazine, Dilaudid [hydromorphone hcl], Guaifenesin, Guaifenesin, Hydromorphone, Hydromorphone hcl, Imiquimod, Lisinopril, Methocarbamol, Metoclopramide, Metoclopramide hcl, Other, Prochlorperazine, Promethazine, Sucralfate, Sulfamethoxazole-trimethoprim, Trifluoperazine, Vancomycin, Zolpidem, and Metformin  Review of Systems   Review of Systems  Constitutional:  Negative for chills and fever.  HENT:  Negative for sore throat.   Eyes:  Negative for redness.  Respiratory:  Negative for cough and shortness of breath.   Cardiovascular:  Negative for chest pain.   Gastrointestinal:  Positive for abdominal pain, nausea and vomiting.  Genitourinary:  Negative for  dysuria and flank pain.  Musculoskeletal:  Negative for back pain and neck pain.  Skin:  Negative for rash.  Neurological:  Negative for headaches.  Hematological:  Does not bruise/bleed easily.  Psychiatric/Behavioral:  Negative for confusion.    Physical Exam Updated Vital Signs BP (!) 153/86   Pulse 88   Temp 98.2 F (36.8 C) (Oral)   Resp 18   SpO2 98%   Physical Exam Vitals and nursing note reviewed.  Constitutional:      Appearance: Normal appearance. She is well-developed.  HENT:     Head: Atraumatic.     Nose: Nose normal.     Mouth/Throat:     Mouth: Mucous membranes are moist.  Eyes:     General: No scleral icterus.    Conjunctiva/sclera: Conjunctivae normal.  Neck:     Trachea: No tracheal deviation.  Cardiovascular:     Rate and Rhythm: Normal rate and regular rhythm.     Pulses: Normal pulses.     Heart sounds: Normal heart sounds. No murmur heard.   No friction rub. No gallop.  Pulmonary:     Effort: Pulmonary effort is normal. No respiratory distress.     Breath sounds: Normal breath sounds.  Abdominal:     General: Bowel sounds are normal. There is no distension.     Palpations: Abdomen is soft.     Tenderness: There is abdominal tenderness. There is no guarding.     Comments: Mid abd tenderness.   Genitourinary:    Comments: No cva tenderness.  Musculoskeletal:        General: No swelling.     Cervical back: Normal range of motion and neck supple. No rigidity. No muscular tenderness.  Skin:    General: Skin is warm and dry.     Findings: No rash.  Neurological:     Mental Status: She is alert.     Comments: Alert, speech normal.   Psychiatric:        Mood and Affect: Mood normal.    ED Results / Procedures / Treatments   Labs (all labs ordered are listed, but only abnormal results are displayed) Results for orders placed or performed  during the hospital encounter of 10/14/21  Lipase, blood  Result Value Ref Range   Lipase 20 11 - 51 U/L  Comprehensive metabolic panel  Result Value Ref Range   Sodium 137 135 - 145 mmol/L   Potassium 4.2 3.5 - 5.1 mmol/L   Chloride 101 98 - 111 mmol/L   CO2 26 22 - 32 mmol/L   Glucose, Bld 108 (H) 70 - 99 mg/dL   BUN 11 8 - 23 mg/dL   Creatinine, Ser 0.73 0.44 - 1.00 mg/dL   Calcium 9.9 8.9 - 10.3 mg/dL   Total Protein 7.6 6.5 - 8.1 g/dL   Albumin 4.2 3.5 - 5.0 g/dL   AST 19 15 - 41 U/L   ALT 10 0 - 44 U/L   Alkaline Phosphatase 40 38 - 126 U/L   Total Bilirubin 0.6 0.3 - 1.2 mg/dL   GFR, Estimated >60 >60 mL/min   Anion gap 10 5 - 15  CBC  Result Value Ref Range   WBC 5.6 4.0 - 10.5 K/uL   RBC 4.60 3.87 - 5.11 MIL/uL   Hemoglobin 12.6 12.0 - 15.0 g/dL   HCT 39.2 36.0 - 46.0 %   MCV 85.2 80.0 - 100.0 fL   MCH 27.4 26.0 - 34.0 pg   MCHC 32.1 30.0 -  36.0 g/dL   RDW 16.4 (H) 11.5 - 15.5 %   Platelets 262 150 - 400 K/uL   nRBC 0.0 0.0 - 0.2 %    EKG None  Radiology No results found.  Procedures Procedures   Medications Ordered in ED Medications  sodium chloride 0.9 % bolus 1,000 mL (1,000 mLs Intravenous New Bag/Given 10/14/21 2050)  ondansetron (ZOFRAN) injection 4 mg (4 mg Intravenous Given 10/14/21 2050)  sodium chloride 0.9 % bolus 1,000 mL (1,000 mLs Intravenous New Bag/Given 10/14/21 2141)  morphine 4 MG/ML injection 4 mg (4 mg Intravenous Given 10/14/21 2142)  ondansetron (ZOFRAN) injection 4 mg (4 mg Intravenous Given 10/14/21 2141)    ED Course  I have reviewed the triage vital signs and the nursing notes.  Pertinent labs & imaging results that were available during my care of the patient were reviewed by me and considered in my medical decision making (see chart for details).    MDM Rules/Calculators/A&P                           Iv ns bolus. Morphine iv. Zofran iv. Labs sent. Imaging ordered.   Reviewed nursing notes and prior charts for  additional history.   Labs reviewed/interpreted by me - wbc normal, lipase normal.   CT pending.   2305, ct pending. Signed out to Dr Randal Buba to check ct and dispo appropriately (if CT neg acute, anticipate d/c to home).     Final Clinical Impression(s) / ED Diagnoses Final diagnoses:  None    Rx / DC Orders ED Discharge Orders     None        Lajean Saver, MD 10/14/21 2307

## 2021-10-14 NOTE — ED Triage Notes (Signed)
Pt arrived via POV, c/o diffuse abd pain and vomiting. States she is concerned she has diverticulitis. Denies any diarrhea or dysuria.

## 2021-10-17 DIAGNOSIS — I10 Essential (primary) hypertension: Secondary | ICD-10-CM | POA: Diagnosis not present

## 2021-10-17 DIAGNOSIS — K219 Gastro-esophageal reflux disease without esophagitis: Secondary | ICD-10-CM | POA: Diagnosis not present

## 2021-10-17 DIAGNOSIS — E039 Hypothyroidism, unspecified: Secondary | ICD-10-CM | POA: Diagnosis not present

## 2021-10-17 DIAGNOSIS — M810 Age-related osteoporosis without current pathological fracture: Secondary | ICD-10-CM | POA: Diagnosis not present

## 2021-10-17 DIAGNOSIS — J45909 Unspecified asthma, uncomplicated: Secondary | ICD-10-CM | POA: Diagnosis not present

## 2021-11-03 ENCOUNTER — Other Ambulatory Visit: Payer: Medicare Other

## 2021-11-22 DIAGNOSIS — Z79899 Other long term (current) drug therapy: Secondary | ICD-10-CM | POA: Diagnosis not present

## 2021-11-22 DIAGNOSIS — M549 Dorsalgia, unspecified: Secondary | ICD-10-CM | POA: Diagnosis not present

## 2021-11-22 DIAGNOSIS — K2210 Ulcer of esophagus without bleeding: Secondary | ICD-10-CM | POA: Diagnosis not present

## 2021-11-22 DIAGNOSIS — E538 Deficiency of other specified B group vitamins: Secondary | ICD-10-CM | POA: Diagnosis not present

## 2021-11-22 DIAGNOSIS — K317 Polyp of stomach and duodenum: Secondary | ICD-10-CM | POA: Diagnosis not present

## 2021-11-22 DIAGNOSIS — I10 Essential (primary) hypertension: Secondary | ICD-10-CM | POA: Diagnosis not present

## 2021-11-22 DIAGNOSIS — G8929 Other chronic pain: Secondary | ICD-10-CM | POA: Diagnosis not present

## 2021-11-22 DIAGNOSIS — Z853 Personal history of malignant neoplasm of breast: Secondary | ICD-10-CM | POA: Diagnosis not present

## 2021-11-22 DIAGNOSIS — G35 Multiple sclerosis: Secondary | ICD-10-CM | POA: Diagnosis not present

## 2021-11-22 DIAGNOSIS — E038 Other specified hypothyroidism: Secondary | ICD-10-CM | POA: Diagnosis not present

## 2021-11-22 DIAGNOSIS — K3189 Other diseases of stomach and duodenum: Secondary | ICD-10-CM | POA: Diagnosis not present

## 2021-11-22 DIAGNOSIS — E271 Primary adrenocortical insufficiency: Secondary | ICD-10-CM | POA: Diagnosis not present

## 2021-11-22 DIAGNOSIS — K2289 Other specified disease of esophagus: Secondary | ICD-10-CM | POA: Diagnosis not present

## 2021-11-22 DIAGNOSIS — J45909 Unspecified asthma, uncomplicated: Secondary | ICD-10-CM | POA: Diagnosis not present

## 2021-11-22 DIAGNOSIS — K222 Esophageal obstruction: Secondary | ICD-10-CM | POA: Diagnosis not present

## 2021-12-06 DIAGNOSIS — R42 Dizziness and giddiness: Secondary | ICD-10-CM | POA: Diagnosis not present

## 2021-12-06 DIAGNOSIS — M5430 Sciatica, unspecified side: Secondary | ICD-10-CM | POA: Diagnosis not present

## 2021-12-08 ENCOUNTER — Other Ambulatory Visit: Payer: Medicare Other

## 2021-12-12 DIAGNOSIS — M4726 Other spondylosis with radiculopathy, lumbar region: Secondary | ICD-10-CM | POA: Diagnosis not present

## 2021-12-12 DIAGNOSIS — G894 Chronic pain syndrome: Secondary | ICD-10-CM | POA: Diagnosis not present

## 2021-12-12 DIAGNOSIS — Z79891 Long term (current) use of opiate analgesic: Secondary | ICD-10-CM | POA: Diagnosis not present

## 2021-12-12 DIAGNOSIS — M41115 Juvenile idiopathic scoliosis, thoracolumbar region: Secondary | ICD-10-CM | POA: Diagnosis not present

## 2021-12-13 DIAGNOSIS — M41115 Juvenile idiopathic scoliosis, thoracolumbar region: Secondary | ICD-10-CM | POA: Diagnosis not present

## 2021-12-13 DIAGNOSIS — M5432 Sciatica, left side: Secondary | ICD-10-CM | POA: Diagnosis not present

## 2021-12-13 DIAGNOSIS — M5442 Lumbago with sciatica, left side: Secondary | ICD-10-CM | POA: Diagnosis not present

## 2021-12-13 DIAGNOSIS — R42 Dizziness and giddiness: Secondary | ICD-10-CM | POA: Diagnosis not present

## 2021-12-15 ENCOUNTER — Other Ambulatory Visit: Payer: Medicare Other

## 2021-12-20 DIAGNOSIS — M41115 Juvenile idiopathic scoliosis, thoracolumbar region: Secondary | ICD-10-CM | POA: Diagnosis not present

## 2021-12-20 DIAGNOSIS — R42 Dizziness and giddiness: Secondary | ICD-10-CM | POA: Diagnosis not present

## 2021-12-20 DIAGNOSIS — M5442 Lumbago with sciatica, left side: Secondary | ICD-10-CM | POA: Diagnosis not present

## 2021-12-20 DIAGNOSIS — M5432 Sciatica, left side: Secondary | ICD-10-CM | POA: Diagnosis not present

## 2021-12-28 DIAGNOSIS — R110 Nausea: Secondary | ICD-10-CM | POA: Diagnosis not present

## 2021-12-28 DIAGNOSIS — M810 Age-related osteoporosis without current pathological fracture: Secondary | ICD-10-CM | POA: Diagnosis not present

## 2021-12-28 DIAGNOSIS — K219 Gastro-esophageal reflux disease without esophagitis: Secondary | ICD-10-CM | POA: Diagnosis not present

## 2021-12-28 DIAGNOSIS — E538 Deficiency of other specified B group vitamins: Secondary | ICD-10-CM | POA: Diagnosis not present

## 2021-12-28 DIAGNOSIS — R7611 Nonspecific reaction to tuberculin skin test without active tuberculosis: Secondary | ICD-10-CM | POA: Diagnosis not present

## 2021-12-28 DIAGNOSIS — Z0000 Encounter for general adult medical examination without abnormal findings: Secondary | ICD-10-CM | POA: Diagnosis not present

## 2021-12-28 DIAGNOSIS — Z79899 Other long term (current) drug therapy: Secondary | ICD-10-CM | POA: Diagnosis not present

## 2021-12-28 DIAGNOSIS — G35 Multiple sclerosis: Secondary | ICD-10-CM | POA: Diagnosis not present

## 2021-12-28 DIAGNOSIS — B379 Candidiasis, unspecified: Secondary | ICD-10-CM | POA: Diagnosis not present

## 2021-12-28 DIAGNOSIS — B029 Zoster without complications: Secondary | ICD-10-CM | POA: Diagnosis not present

## 2021-12-28 DIAGNOSIS — I10 Essential (primary) hypertension: Secondary | ICD-10-CM | POA: Diagnosis not present

## 2021-12-28 DIAGNOSIS — R42 Dizziness and giddiness: Secondary | ICD-10-CM | POA: Diagnosis not present

## 2021-12-29 ENCOUNTER — Other Ambulatory Visit: Payer: Self-pay | Admitting: Family Medicine

## 2021-12-29 DIAGNOSIS — R42 Dizziness and giddiness: Secondary | ICD-10-CM

## 2022-01-10 DIAGNOSIS — G35 Multiple sclerosis: Secondary | ICD-10-CM | POA: Diagnosis not present

## 2022-01-10 DIAGNOSIS — H40033 Anatomical narrow angle, bilateral: Secondary | ICD-10-CM | POA: Diagnosis not present

## 2022-01-10 DIAGNOSIS — H16223 Keratoconjunctivitis sicca, not specified as Sjogren's, bilateral: Secondary | ICD-10-CM | POA: Diagnosis not present

## 2022-01-10 DIAGNOSIS — H0102A Squamous blepharitis right eye, upper and lower eyelids: Secondary | ICD-10-CM | POA: Diagnosis not present

## 2022-01-10 DIAGNOSIS — H2513 Age-related nuclear cataract, bilateral: Secondary | ICD-10-CM | POA: Diagnosis not present

## 2022-01-10 DIAGNOSIS — H0102B Squamous blepharitis left eye, upper and lower eyelids: Secondary | ICD-10-CM | POA: Diagnosis not present

## 2022-01-10 DIAGNOSIS — H16121 Filamentary keratitis, right eye: Secondary | ICD-10-CM | POA: Diagnosis not present

## 2022-01-11 DIAGNOSIS — Z20822 Contact with and (suspected) exposure to covid-19: Secondary | ICD-10-CM | POA: Diagnosis not present

## 2022-01-21 ENCOUNTER — Ambulatory Visit
Admission: RE | Admit: 2022-01-21 | Discharge: 2022-01-21 | Disposition: A | Discharge status: Home / Self Care | Payer: Medicare Other | Source: Ambulatory Visit | Attending: Family Medicine | Admitting: Family Medicine

## 2022-01-21 ENCOUNTER — Other Ambulatory Visit: Payer: Self-pay

## 2022-01-21 DIAGNOSIS — H9191 Unspecified hearing loss, right ear: Secondary | ICD-10-CM | POA: Diagnosis not present

## 2022-01-21 DIAGNOSIS — Z981 Arthrodesis status: Secondary | ICD-10-CM | POA: Diagnosis not present

## 2022-01-21 DIAGNOSIS — R42 Dizziness and giddiness: Secondary | ICD-10-CM

## 2022-01-21 DIAGNOSIS — I6381 Other cerebral infarction due to occlusion or stenosis of small artery: Secondary | ICD-10-CM | POA: Diagnosis not present

## 2022-01-21 DIAGNOSIS — I6782 Cerebral ischemia: Secondary | ICD-10-CM | POA: Diagnosis not present

## 2022-01-31 DIAGNOSIS — K219 Gastro-esophageal reflux disease without esophagitis: Secondary | ICD-10-CM | POA: Diagnosis not present

## 2022-01-31 DIAGNOSIS — K319 Disease of stomach and duodenum, unspecified: Secondary | ICD-10-CM | POA: Diagnosis not present

## 2022-01-31 DIAGNOSIS — E039 Hypothyroidism, unspecified: Secondary | ICD-10-CM | POA: Diagnosis not present

## 2022-01-31 DIAGNOSIS — K222 Esophageal obstruction: Secondary | ICD-10-CM | POA: Diagnosis not present

## 2022-01-31 DIAGNOSIS — N200 Calculus of kidney: Secondary | ICD-10-CM | POA: Diagnosis not present

## 2022-01-31 DIAGNOSIS — I10 Essential (primary) hypertension: Secondary | ICD-10-CM | POA: Diagnosis not present

## 2022-01-31 DIAGNOSIS — Z7982 Long term (current) use of aspirin: Secondary | ICD-10-CM | POA: Diagnosis not present

## 2022-01-31 DIAGNOSIS — Z79899 Other long term (current) drug therapy: Secondary | ICD-10-CM | POA: Diagnosis not present

## 2022-01-31 DIAGNOSIS — K3189 Other diseases of stomach and duodenum: Secondary | ICD-10-CM | POA: Diagnosis not present

## 2022-01-31 DIAGNOSIS — K317 Polyp of stomach and duodenum: Secondary | ICD-10-CM | POA: Diagnosis not present

## 2022-01-31 DIAGNOSIS — Z79890 Hormone replacement therapy (postmenopausal): Secondary | ICD-10-CM | POA: Diagnosis not present

## 2022-01-31 DIAGNOSIS — K259 Gastric ulcer, unspecified as acute or chronic, without hemorrhage or perforation: Secondary | ICD-10-CM | POA: Diagnosis not present

## 2022-01-31 DIAGNOSIS — K2210 Ulcer of esophagus without bleeding: Secondary | ICD-10-CM | POA: Diagnosis not present

## 2022-01-31 DIAGNOSIS — J45909 Unspecified asthma, uncomplicated: Secondary | ICD-10-CM | POA: Diagnosis not present

## 2022-02-06 DIAGNOSIS — G894 Chronic pain syndrome: Secondary | ICD-10-CM | POA: Diagnosis not present

## 2022-02-06 DIAGNOSIS — M41115 Juvenile idiopathic scoliosis, thoracolumbar region: Secondary | ICD-10-CM | POA: Diagnosis not present

## 2022-02-06 DIAGNOSIS — M4726 Other spondylosis with radiculopathy, lumbar region: Secondary | ICD-10-CM | POA: Diagnosis not present

## 2022-02-06 DIAGNOSIS — Z79891 Long term (current) use of opiate analgesic: Secondary | ICD-10-CM | POA: Diagnosis not present

## 2022-02-16 DIAGNOSIS — Z1211 Encounter for screening for malignant neoplasm of colon: Secondary | ICD-10-CM | POA: Diagnosis not present

## 2022-02-16 DIAGNOSIS — Z1212 Encounter for screening for malignant neoplasm of rectum: Secondary | ICD-10-CM | POA: Diagnosis not present

## 2022-03-16 DIAGNOSIS — Z20822 Contact with and (suspected) exposure to covid-19: Secondary | ICD-10-CM | POA: Diagnosis not present

## 2022-04-03 DIAGNOSIS — G894 Chronic pain syndrome: Secondary | ICD-10-CM | POA: Diagnosis not present

## 2022-04-03 DIAGNOSIS — Z79891 Long term (current) use of opiate analgesic: Secondary | ICD-10-CM | POA: Diagnosis not present

## 2022-04-03 DIAGNOSIS — M4726 Other spondylosis with radiculopathy, lumbar region: Secondary | ICD-10-CM | POA: Diagnosis not present

## 2022-04-03 DIAGNOSIS — M41115 Juvenile idiopathic scoliosis, thoracolumbar region: Secondary | ICD-10-CM | POA: Diagnosis not present

## 2022-04-05 DIAGNOSIS — U071 COVID-19: Secondary | ICD-10-CM | POA: Diagnosis not present

## 2022-04-09 DIAGNOSIS — Z20822 Contact with and (suspected) exposure to covid-19: Secondary | ICD-10-CM | POA: Diagnosis not present

## 2022-04-25 DIAGNOSIS — Z7982 Long term (current) use of aspirin: Secondary | ICD-10-CM | POA: Diagnosis not present

## 2022-04-25 DIAGNOSIS — K222 Esophageal obstruction: Secondary | ICD-10-CM | POA: Diagnosis not present

## 2022-04-25 DIAGNOSIS — R1310 Dysphagia, unspecified: Secondary | ICD-10-CM | POA: Diagnosis not present

## 2022-04-25 DIAGNOSIS — Z888 Allergy status to other drugs, medicaments and biological substances status: Secondary | ICD-10-CM | POA: Diagnosis not present

## 2022-04-25 DIAGNOSIS — Z853 Personal history of malignant neoplasm of breast: Secondary | ICD-10-CM | POA: Diagnosis not present

## 2022-04-25 DIAGNOSIS — K219 Gastro-esophageal reflux disease without esophagitis: Secondary | ICD-10-CM | POA: Diagnosis not present

## 2022-04-25 DIAGNOSIS — Z79899 Other long term (current) drug therapy: Secondary | ICD-10-CM | POA: Diagnosis not present

## 2022-04-25 DIAGNOSIS — E039 Hypothyroidism, unspecified: Secondary | ICD-10-CM | POA: Diagnosis not present

## 2022-04-25 DIAGNOSIS — K259 Gastric ulcer, unspecified as acute or chronic, without hemorrhage or perforation: Secondary | ICD-10-CM | POA: Diagnosis not present

## 2022-04-25 DIAGNOSIS — J45909 Unspecified asthma, uncomplicated: Secondary | ICD-10-CM | POA: Diagnosis not present

## 2022-04-25 DIAGNOSIS — I10 Essential (primary) hypertension: Secondary | ICD-10-CM | POA: Diagnosis not present

## 2022-04-25 DIAGNOSIS — Z91041 Radiographic dye allergy status: Secondary | ICD-10-CM | POA: Diagnosis not present

## 2022-04-25 DIAGNOSIS — K317 Polyp of stomach and duodenum: Secondary | ICD-10-CM | POA: Diagnosis not present

## 2022-04-25 DIAGNOSIS — G35 Multiple sclerosis: Secondary | ICD-10-CM | POA: Diagnosis not present

## 2022-05-29 DIAGNOSIS — G894 Chronic pain syndrome: Secondary | ICD-10-CM | POA: Diagnosis not present

## 2022-05-29 DIAGNOSIS — M41115 Juvenile idiopathic scoliosis, thoracolumbar region: Secondary | ICD-10-CM | POA: Diagnosis not present

## 2022-05-29 DIAGNOSIS — Z79891 Long term (current) use of opiate analgesic: Secondary | ICD-10-CM | POA: Diagnosis not present

## 2022-05-29 DIAGNOSIS — M4726 Other spondylosis with radiculopathy, lumbar region: Secondary | ICD-10-CM | POA: Diagnosis not present

## 2022-05-30 DIAGNOSIS — G894 Chronic pain syndrome: Secondary | ICD-10-CM | POA: Diagnosis not present

## 2022-05-30 DIAGNOSIS — Z79891 Long term (current) use of opiate analgesic: Secondary | ICD-10-CM | POA: Diagnosis not present

## 2022-06-28 DIAGNOSIS — L821 Other seborrheic keratosis: Secondary | ICD-10-CM | POA: Diagnosis not present

## 2022-06-28 DIAGNOSIS — L570 Actinic keratosis: Secondary | ICD-10-CM | POA: Diagnosis not present

## 2022-07-04 DIAGNOSIS — H16121 Filamentary keratitis, right eye: Secondary | ICD-10-CM | POA: Diagnosis not present

## 2022-07-04 DIAGNOSIS — G35 Multiple sclerosis: Secondary | ICD-10-CM | POA: Diagnosis not present

## 2022-07-04 DIAGNOSIS — H0102A Squamous blepharitis right eye, upper and lower eyelids: Secondary | ICD-10-CM | POA: Diagnosis not present

## 2022-07-04 DIAGNOSIS — H0102B Squamous blepharitis left eye, upper and lower eyelids: Secondary | ICD-10-CM | POA: Diagnosis not present

## 2022-07-04 DIAGNOSIS — H40033 Anatomical narrow angle, bilateral: Secondary | ICD-10-CM | POA: Diagnosis not present

## 2022-07-04 DIAGNOSIS — H2513 Age-related nuclear cataract, bilateral: Secondary | ICD-10-CM | POA: Diagnosis not present

## 2022-07-07 DIAGNOSIS — R2689 Other abnormalities of gait and mobility: Secondary | ICD-10-CM | POA: Diagnosis not present

## 2022-07-07 DIAGNOSIS — E039 Hypothyroidism, unspecified: Secondary | ICD-10-CM | POA: Diagnosis not present

## 2022-07-07 DIAGNOSIS — R253 Fasciculation: Secondary | ICD-10-CM | POA: Diagnosis not present

## 2022-07-07 DIAGNOSIS — R296 Repeated falls: Secondary | ICD-10-CM | POA: Diagnosis not present

## 2022-07-07 DIAGNOSIS — E230 Hypopituitarism: Secondary | ICD-10-CM | POA: Diagnosis not present

## 2022-07-25 DIAGNOSIS — Z79891 Long term (current) use of opiate analgesic: Secondary | ICD-10-CM | POA: Diagnosis not present

## 2022-07-25 DIAGNOSIS — M41115 Juvenile idiopathic scoliosis, thoracolumbar region: Secondary | ICD-10-CM | POA: Diagnosis not present

## 2022-07-25 DIAGNOSIS — G894 Chronic pain syndrome: Secondary | ICD-10-CM | POA: Diagnosis not present

## 2022-07-25 DIAGNOSIS — M4726 Other spondylosis with radiculopathy, lumbar region: Secondary | ICD-10-CM | POA: Diagnosis not present

## 2022-07-28 DIAGNOSIS — Z853 Personal history of malignant neoplasm of breast: Secondary | ICD-10-CM | POA: Diagnosis not present

## 2022-07-28 DIAGNOSIS — R2230 Localized swelling, mass and lump, unspecified upper limb: Secondary | ICD-10-CM | POA: Diagnosis not present

## 2022-07-28 DIAGNOSIS — F411 Generalized anxiety disorder: Secondary | ICD-10-CM | POA: Diagnosis not present

## 2022-08-01 DIAGNOSIS — K2970 Gastritis, unspecified, without bleeding: Secondary | ICD-10-CM | POA: Diagnosis not present

## 2022-08-01 DIAGNOSIS — E039 Hypothyroidism, unspecified: Secondary | ICD-10-CM | POA: Diagnosis not present

## 2022-08-01 DIAGNOSIS — Z79899 Other long term (current) drug therapy: Secondary | ICD-10-CM | POA: Diagnosis not present

## 2022-08-01 DIAGNOSIS — K222 Esophageal obstruction: Secondary | ICD-10-CM | POA: Diagnosis not present

## 2022-08-01 DIAGNOSIS — Z7982 Long term (current) use of aspirin: Secondary | ICD-10-CM | POA: Diagnosis not present

## 2022-08-01 DIAGNOSIS — G35 Multiple sclerosis: Secondary | ICD-10-CM | POA: Diagnosis not present

## 2022-08-01 DIAGNOSIS — G8929 Other chronic pain: Secondary | ICD-10-CM | POA: Diagnosis not present

## 2022-08-01 DIAGNOSIS — K259 Gastric ulcer, unspecified as acute or chronic, without hemorrhage or perforation: Secondary | ICD-10-CM | POA: Diagnosis not present

## 2022-08-01 DIAGNOSIS — K317 Polyp of stomach and duodenum: Secondary | ICD-10-CM | POA: Diagnosis not present

## 2022-08-01 DIAGNOSIS — I10 Essential (primary) hypertension: Secondary | ICD-10-CM | POA: Diagnosis not present

## 2022-08-01 DIAGNOSIS — M549 Dorsalgia, unspecified: Secondary | ICD-10-CM | POA: Diagnosis not present

## 2022-08-01 DIAGNOSIS — R1310 Dysphagia, unspecified: Secondary | ICD-10-CM | POA: Diagnosis not present

## 2022-08-01 DIAGNOSIS — K2210 Ulcer of esophagus without bleeding: Secondary | ICD-10-CM | POA: Diagnosis not present

## 2022-08-01 DIAGNOSIS — K2950 Unspecified chronic gastritis without bleeding: Secondary | ICD-10-CM | POA: Diagnosis not present

## 2022-08-08 DIAGNOSIS — R2230 Localized swelling, mass and lump, unspecified upper limb: Secondary | ICD-10-CM | POA: Diagnosis not present

## 2022-08-08 DIAGNOSIS — R928 Other abnormal and inconclusive findings on diagnostic imaging of breast: Secondary | ICD-10-CM | POA: Diagnosis not present

## 2022-08-22 DIAGNOSIS — N6459 Other signs and symptoms in breast: Secondary | ICD-10-CM | POA: Diagnosis not present

## 2022-08-22 DIAGNOSIS — R590 Localized enlarged lymph nodes: Secondary | ICD-10-CM | POA: Diagnosis not present

## 2022-08-23 DIAGNOSIS — R221 Localized swelling, mass and lump, neck: Secondary | ICD-10-CM | POA: Diagnosis not present

## 2022-10-02 DIAGNOSIS — G894 Chronic pain syndrome: Secondary | ICD-10-CM | POA: Diagnosis not present

## 2022-10-02 DIAGNOSIS — M4726 Other spondylosis with radiculopathy, lumbar region: Secondary | ICD-10-CM | POA: Diagnosis not present

## 2022-10-02 DIAGNOSIS — M41115 Juvenile idiopathic scoliosis, thoracolumbar region: Secondary | ICD-10-CM | POA: Diagnosis not present

## 2022-10-02 DIAGNOSIS — Z79891 Long term (current) use of opiate analgesic: Secondary | ICD-10-CM | POA: Diagnosis not present

## 2022-10-31 DIAGNOSIS — K3189 Other diseases of stomach and duodenum: Secondary | ICD-10-CM | POA: Diagnosis not present

## 2022-10-31 DIAGNOSIS — K2289 Other specified disease of esophagus: Secondary | ICD-10-CM | POA: Diagnosis not present

## 2022-10-31 DIAGNOSIS — Z91041 Radiographic dye allergy status: Secondary | ICD-10-CM | POA: Diagnosis not present

## 2022-10-31 DIAGNOSIS — Z881 Allergy status to other antibiotic agents status: Secondary | ICD-10-CM | POA: Diagnosis not present

## 2022-10-31 DIAGNOSIS — Z888 Allergy status to other drugs, medicaments and biological substances status: Secondary | ICD-10-CM | POA: Diagnosis not present

## 2022-10-31 DIAGNOSIS — Z885 Allergy status to narcotic agent status: Secondary | ICD-10-CM | POA: Diagnosis not present

## 2022-10-31 DIAGNOSIS — K279 Peptic ulcer, site unspecified, unspecified as acute or chronic, without hemorrhage or perforation: Secondary | ICD-10-CM | POA: Diagnosis not present

## 2022-10-31 DIAGNOSIS — K449 Diaphragmatic hernia without obstruction or gangrene: Secondary | ICD-10-CM | POA: Diagnosis not present

## 2022-10-31 DIAGNOSIS — Z87442 Personal history of urinary calculi: Secondary | ICD-10-CM | POA: Diagnosis not present

## 2022-10-31 DIAGNOSIS — K2210 Ulcer of esophagus without bleeding: Secondary | ICD-10-CM | POA: Diagnosis not present

## 2022-10-31 DIAGNOSIS — E119 Type 2 diabetes mellitus without complications: Secondary | ICD-10-CM | POA: Diagnosis not present

## 2022-10-31 DIAGNOSIS — K30 Functional dyspepsia: Secondary | ICD-10-CM | POA: Diagnosis not present

## 2022-10-31 DIAGNOSIS — G8929 Other chronic pain: Secondary | ICD-10-CM | POA: Diagnosis not present

## 2022-10-31 DIAGNOSIS — E271 Primary adrenocortical insufficiency: Secondary | ICD-10-CM | POA: Diagnosis not present

## 2022-10-31 DIAGNOSIS — K222 Esophageal obstruction: Secondary | ICD-10-CM | POA: Diagnosis not present

## 2022-10-31 DIAGNOSIS — E039 Hypothyroidism, unspecified: Secondary | ICD-10-CM | POA: Diagnosis not present

## 2022-10-31 DIAGNOSIS — I10 Essential (primary) hypertension: Secondary | ICD-10-CM | POA: Diagnosis not present

## 2022-10-31 DIAGNOSIS — M549 Dorsalgia, unspecified: Secondary | ICD-10-CM | POA: Diagnosis not present

## 2022-10-31 DIAGNOSIS — K259 Gastric ulcer, unspecified as acute or chronic, without hemorrhage or perforation: Secondary | ICD-10-CM | POA: Diagnosis not present

## 2022-10-31 DIAGNOSIS — K219 Gastro-esophageal reflux disease without esophagitis: Secondary | ICD-10-CM | POA: Diagnosis not present

## 2022-10-31 DIAGNOSIS — G35 Multiple sclerosis: Secondary | ICD-10-CM | POA: Diagnosis not present

## 2022-11-07 DIAGNOSIS — Z79899 Other long term (current) drug therapy: Secondary | ICD-10-CM | POA: Diagnosis not present

## 2022-11-07 DIAGNOSIS — Z91041 Radiographic dye allergy status: Secondary | ICD-10-CM | POA: Diagnosis not present

## 2022-11-07 DIAGNOSIS — R112 Nausea with vomiting, unspecified: Secondary | ICD-10-CM | POA: Diagnosis not present

## 2022-11-07 DIAGNOSIS — E271 Primary adrenocortical insufficiency: Secondary | ICD-10-CM | POA: Diagnosis not present

## 2022-11-07 DIAGNOSIS — Z888 Allergy status to other drugs, medicaments and biological substances status: Secondary | ICD-10-CM | POA: Diagnosis not present

## 2022-11-07 DIAGNOSIS — Z882 Allergy status to sulfonamides status: Secondary | ICD-10-CM | POA: Diagnosis not present

## 2022-11-07 DIAGNOSIS — Z881 Allergy status to other antibiotic agents status: Secondary | ICD-10-CM | POA: Diagnosis not present

## 2022-11-07 DIAGNOSIS — I10 Essential (primary) hypertension: Secondary | ICD-10-CM | POA: Diagnosis not present

## 2022-11-07 DIAGNOSIS — G35 Multiple sclerosis: Secondary | ICD-10-CM | POA: Diagnosis not present

## 2022-11-07 DIAGNOSIS — Z859 Personal history of malignant neoplasm, unspecified: Secondary | ICD-10-CM | POA: Diagnosis not present

## 2022-12-08 DIAGNOSIS — R1319 Other dysphagia: Secondary | ICD-10-CM | POA: Diagnosis not present

## 2022-12-08 DIAGNOSIS — K625 Hemorrhage of anus and rectum: Secondary | ICD-10-CM | POA: Diagnosis not present

## 2022-12-26 DIAGNOSIS — M4726 Other spondylosis with radiculopathy, lumbar region: Secondary | ICD-10-CM | POA: Diagnosis not present

## 2022-12-26 DIAGNOSIS — G894 Chronic pain syndrome: Secondary | ICD-10-CM | POA: Diagnosis not present

## 2022-12-26 DIAGNOSIS — M41115 Juvenile idiopathic scoliosis, thoracolumbar region: Secondary | ICD-10-CM | POA: Diagnosis not present

## 2022-12-26 DIAGNOSIS — Z79891 Long term (current) use of opiate analgesic: Secondary | ICD-10-CM | POA: Diagnosis not present

## 2023-01-01 DIAGNOSIS — E538 Deficiency of other specified B group vitamins: Secondary | ICD-10-CM | POA: Diagnosis not present

## 2023-01-01 DIAGNOSIS — Z6824 Body mass index (BMI) 24.0-24.9, adult: Secondary | ICD-10-CM | POA: Diagnosis not present

## 2023-01-01 DIAGNOSIS — G35 Multiple sclerosis: Secondary | ICD-10-CM | POA: Diagnosis not present

## 2023-01-01 DIAGNOSIS — I10 Essential (primary) hypertension: Secondary | ICD-10-CM | POA: Diagnosis not present

## 2023-01-01 DIAGNOSIS — E039 Hypothyroidism, unspecified: Secondary | ICD-10-CM | POA: Diagnosis not present

## 2023-01-01 DIAGNOSIS — M810 Age-related osteoporosis without current pathological fracture: Secondary | ICD-10-CM | POA: Diagnosis not present

## 2023-01-01 DIAGNOSIS — B372 Candidiasis of skin and nail: Secondary | ICD-10-CM | POA: Diagnosis not present

## 2023-01-01 DIAGNOSIS — K219 Gastro-esophageal reflux disease without esophagitis: Secondary | ICD-10-CM | POA: Diagnosis not present

## 2023-01-01 DIAGNOSIS — F411 Generalized anxiety disorder: Secondary | ICD-10-CM | POA: Diagnosis not present

## 2023-01-01 DIAGNOSIS — Z0000 Encounter for general adult medical examination without abnormal findings: Secondary | ICD-10-CM | POA: Diagnosis not present

## 2023-01-01 DIAGNOSIS — B029 Zoster without complications: Secondary | ICD-10-CM | POA: Diagnosis not present

## 2023-01-01 DIAGNOSIS — R110 Nausea: Secondary | ICD-10-CM | POA: Diagnosis not present

## 2023-01-09 DIAGNOSIS — K648 Other hemorrhoids: Secondary | ICD-10-CM | POA: Diagnosis not present

## 2023-01-09 DIAGNOSIS — K625 Hemorrhage of anus and rectum: Secondary | ICD-10-CM | POA: Diagnosis not present

## 2023-01-09 DIAGNOSIS — K6389 Other specified diseases of intestine: Secondary | ICD-10-CM | POA: Diagnosis not present

## 2023-02-13 ENCOUNTER — Ambulatory Visit
Admission: RE | Admit: 2023-02-13 | Discharge: 2023-02-13 | Disposition: A | Discharge status: Home / Self Care | Payer: Medicare Other | Source: Ambulatory Visit | Attending: Internal Medicine | Admitting: Internal Medicine

## 2023-02-13 ENCOUNTER — Other Ambulatory Visit: Payer: Self-pay | Admitting: Internal Medicine

## 2023-02-13 DIAGNOSIS — R079 Chest pain, unspecified: Secondary | ICD-10-CM | POA: Diagnosis not present

## 2023-02-13 DIAGNOSIS — Z79899 Other long term (current) drug therapy: Secondary | ICD-10-CM | POA: Diagnosis not present

## 2023-02-13 DIAGNOSIS — Z87898 Personal history of other specified conditions: Secondary | ICD-10-CM

## 2023-02-13 DIAGNOSIS — E538 Deficiency of other specified B group vitamins: Secondary | ICD-10-CM | POA: Diagnosis not present

## 2023-02-13 DIAGNOSIS — I10 Essential (primary) hypertension: Secondary | ICD-10-CM | POA: Diagnosis not present

## 2023-02-13 DIAGNOSIS — E2749 Other adrenocortical insufficiency: Secondary | ICD-10-CM | POA: Diagnosis not present

## 2023-02-13 DIAGNOSIS — M810 Age-related osteoporosis without current pathological fracture: Secondary | ICD-10-CM | POA: Diagnosis not present

## 2023-02-13 DIAGNOSIS — E039 Hypothyroidism, unspecified: Secondary | ICD-10-CM | POA: Diagnosis not present

## 2023-02-15 DIAGNOSIS — E162 Hypoglycemia, unspecified: Secondary | ICD-10-CM | POA: Diagnosis not present

## 2023-02-15 DIAGNOSIS — Z6822 Body mass index (BMI) 22.0-22.9, adult: Secondary | ICD-10-CM | POA: Diagnosis not present

## 2023-02-15 DIAGNOSIS — G35 Multiple sclerosis: Secondary | ICD-10-CM | POA: Diagnosis not present

## 2023-02-15 DIAGNOSIS — R112 Nausea with vomiting, unspecified: Secondary | ICD-10-CM | POA: Diagnosis not present

## 2023-02-27 DIAGNOSIS — M4726 Other spondylosis with radiculopathy, lumbar region: Secondary | ICD-10-CM | POA: Diagnosis not present

## 2023-02-27 DIAGNOSIS — Z79891 Long term (current) use of opiate analgesic: Secondary | ICD-10-CM | POA: Diagnosis not present

## 2023-02-27 DIAGNOSIS — M41115 Juvenile idiopathic scoliosis, thoracolumbar region: Secondary | ICD-10-CM | POA: Diagnosis not present

## 2023-02-27 DIAGNOSIS — G894 Chronic pain syndrome: Secondary | ICD-10-CM | POA: Diagnosis not present

## 2023-03-01 DIAGNOSIS — L738 Other specified follicular disorders: Secondary | ICD-10-CM | POA: Diagnosis not present

## 2023-03-01 DIAGNOSIS — I788 Other diseases of capillaries: Secondary | ICD-10-CM | POA: Diagnosis not present

## 2023-03-01 DIAGNOSIS — L821 Other seborrheic keratosis: Secondary | ICD-10-CM | POA: Diagnosis not present

## 2023-03-14 DIAGNOSIS — K2289 Other specified disease of esophagus: Secondary | ICD-10-CM | POA: Diagnosis not present

## 2023-03-14 DIAGNOSIS — J45909 Unspecified asthma, uncomplicated: Secondary | ICD-10-CM | POA: Diagnosis not present

## 2023-03-14 DIAGNOSIS — G35 Multiple sclerosis: Secondary | ICD-10-CM | POA: Diagnosis not present

## 2023-03-14 DIAGNOSIS — E119 Type 2 diabetes mellitus without complications: Secondary | ICD-10-CM | POA: Diagnosis not present

## 2023-03-14 DIAGNOSIS — Z79899 Other long term (current) drug therapy: Secondary | ICD-10-CM | POA: Diagnosis not present

## 2023-03-14 DIAGNOSIS — E039 Hypothyroidism, unspecified: Secondary | ICD-10-CM | POA: Diagnosis not present

## 2023-03-14 DIAGNOSIS — K219 Gastro-esophageal reflux disease without esophagitis: Secondary | ICD-10-CM | POA: Diagnosis not present

## 2023-03-14 DIAGNOSIS — K222 Esophageal obstruction: Secondary | ICD-10-CM | POA: Diagnosis not present

## 2023-03-14 DIAGNOSIS — Z7982 Long term (current) use of aspirin: Secondary | ICD-10-CM | POA: Diagnosis not present

## 2023-03-14 DIAGNOSIS — I10 Essential (primary) hypertension: Secondary | ICD-10-CM | POA: Diagnosis not present

## 2023-03-14 DIAGNOSIS — M549 Dorsalgia, unspecified: Secondary | ICD-10-CM | POA: Diagnosis not present

## 2023-03-14 DIAGNOSIS — K259 Gastric ulcer, unspecified as acute or chronic, without hemorrhage or perforation: Secondary | ICD-10-CM | POA: Diagnosis not present

## 2023-03-14 DIAGNOSIS — Z9013 Acquired absence of bilateral breasts and nipples: Secondary | ICD-10-CM | POA: Diagnosis not present

## 2023-03-14 DIAGNOSIS — G8929 Other chronic pain: Secondary | ICD-10-CM | POA: Diagnosis not present

## 2023-03-14 DIAGNOSIS — K2210 Ulcer of esophagus without bleeding: Secondary | ICD-10-CM | POA: Diagnosis not present

## 2023-03-14 DIAGNOSIS — Z90710 Acquired absence of both cervix and uterus: Secondary | ICD-10-CM | POA: Diagnosis not present

## 2023-03-22 DIAGNOSIS — Z6823 Body mass index (BMI) 23.0-23.9, adult: Secondary | ICD-10-CM | POA: Diagnosis not present

## 2023-03-22 DIAGNOSIS — R609 Edema, unspecified: Secondary | ICD-10-CM | POA: Diagnosis not present

## 2023-03-22 DIAGNOSIS — F411 Generalized anxiety disorder: Secondary | ICD-10-CM | POA: Diagnosis not present

## 2023-03-22 DIAGNOSIS — H6980 Other specified disorders of Eustachian tube, unspecified ear: Secondary | ICD-10-CM | POA: Diagnosis not present

## 2023-03-28 DIAGNOSIS — L718 Other rosacea: Secondary | ICD-10-CM | POA: Diagnosis not present

## 2023-03-28 DIAGNOSIS — L814 Other melanin hyperpigmentation: Secondary | ICD-10-CM | POA: Diagnosis not present

## 2023-03-28 DIAGNOSIS — L720 Epidermal cyst: Secondary | ICD-10-CM | POA: Diagnosis not present

## 2023-04-02 DIAGNOSIS — Z6822 Body mass index (BMI) 22.0-22.9, adult: Secondary | ICD-10-CM | POA: Diagnosis not present

## 2023-04-02 DIAGNOSIS — R399 Unspecified symptoms and signs involving the genitourinary system: Secondary | ICD-10-CM | POA: Diagnosis not present

## 2023-04-05 DIAGNOSIS — R1032 Left lower quadrant pain: Secondary | ICD-10-CM | POA: Diagnosis not present

## 2023-04-05 DIAGNOSIS — Z6822 Body mass index (BMI) 22.0-22.9, adult: Secondary | ICD-10-CM | POA: Diagnosis not present

## 2023-04-10 ENCOUNTER — Other Ambulatory Visit: Payer: Self-pay | Admitting: Family Medicine

## 2023-04-10 ENCOUNTER — Other Ambulatory Visit (HOSPITAL_COMMUNITY): Payer: Self-pay | Admitting: Family Medicine

## 2023-04-10 DIAGNOSIS — R1032 Left lower quadrant pain: Secondary | ICD-10-CM

## 2023-04-20 DIAGNOSIS — R451 Restlessness and agitation: Secondary | ICD-10-CM | POA: Diagnosis not present

## 2023-04-20 DIAGNOSIS — R0781 Pleurodynia: Secondary | ICD-10-CM | POA: Diagnosis not present

## 2023-04-20 DIAGNOSIS — Z6822 Body mass index (BMI) 22.0-22.9, adult: Secondary | ICD-10-CM | POA: Diagnosis not present

## 2023-04-20 DIAGNOSIS — R1310 Dysphagia, unspecified: Secondary | ICD-10-CM | POA: Diagnosis not present

## 2023-05-01 DIAGNOSIS — Z79891 Long term (current) use of opiate analgesic: Secondary | ICD-10-CM | POA: Diagnosis not present

## 2023-05-01 DIAGNOSIS — M41115 Juvenile idiopathic scoliosis, thoracolumbar region: Secondary | ICD-10-CM | POA: Diagnosis not present

## 2023-05-01 DIAGNOSIS — M4726 Other spondylosis with radiculopathy, lumbar region: Secondary | ICD-10-CM | POA: Diagnosis not present

## 2023-05-01 DIAGNOSIS — G894 Chronic pain syndrome: Secondary | ICD-10-CM | POA: Diagnosis not present

## 2023-05-02 DIAGNOSIS — I10 Essential (primary) hypertension: Secondary | ICD-10-CM | POA: Diagnosis not present

## 2023-05-02 DIAGNOSIS — K219 Gastro-esophageal reflux disease without esophagitis: Secondary | ICD-10-CM | POA: Diagnosis not present

## 2023-05-02 DIAGNOSIS — E039 Hypothyroidism, unspecified: Secondary | ICD-10-CM | POA: Diagnosis not present

## 2023-05-02 DIAGNOSIS — G35 Multiple sclerosis: Secondary | ICD-10-CM | POA: Diagnosis not present

## 2023-05-03 ENCOUNTER — Encounter (HOSPITAL_COMMUNITY): Payer: Self-pay

## 2023-05-03 ENCOUNTER — Ambulatory Visit (HOSPITAL_COMMUNITY): Payer: Medicare Other

## 2023-05-15 ENCOUNTER — Encounter: Payer: Self-pay | Admitting: Oncology

## 2023-05-22 DIAGNOSIS — R300 Dysuria: Secondary | ICD-10-CM | POA: Diagnosis not present

## 2023-05-22 DIAGNOSIS — Z6822 Body mass index (BMI) 22.0-22.9, adult: Secondary | ICD-10-CM | POA: Diagnosis not present

## 2023-05-22 DIAGNOSIS — M533 Sacrococcygeal disorders, not elsewhere classified: Secondary | ICD-10-CM | POA: Diagnosis not present

## 2023-05-22 DIAGNOSIS — F411 Generalized anxiety disorder: Secondary | ICD-10-CM | POA: Diagnosis not present

## 2023-06-25 DIAGNOSIS — G894 Chronic pain syndrome: Secondary | ICD-10-CM | POA: Diagnosis not present

## 2023-06-25 DIAGNOSIS — Z79891 Long term (current) use of opiate analgesic: Secondary | ICD-10-CM | POA: Diagnosis not present

## 2023-06-25 DIAGNOSIS — M41115 Juvenile idiopathic scoliosis, thoracolumbar region: Secondary | ICD-10-CM | POA: Diagnosis not present

## 2023-06-25 DIAGNOSIS — M4726 Other spondylosis with radiculopathy, lumbar region: Secondary | ICD-10-CM | POA: Diagnosis not present

## 2023-07-02 DIAGNOSIS — F411 Generalized anxiety disorder: Secondary | ICD-10-CM | POA: Diagnosis not present

## 2023-07-02 DIAGNOSIS — I10 Essential (primary) hypertension: Secondary | ICD-10-CM | POA: Diagnosis not present

## 2023-07-02 DIAGNOSIS — Z6821 Body mass index (BMI) 21.0-21.9, adult: Secondary | ICD-10-CM | POA: Diagnosis not present

## 2023-07-02 DIAGNOSIS — K5900 Constipation, unspecified: Secondary | ICD-10-CM | POA: Diagnosis not present

## 2023-07-02 DIAGNOSIS — R110 Nausea: Secondary | ICD-10-CM | POA: Diagnosis not present

## 2023-07-09 DIAGNOSIS — H16121 Filamentary keratitis, right eye: Secondary | ICD-10-CM | POA: Diagnosis not present

## 2023-07-09 DIAGNOSIS — H0102A Squamous blepharitis right eye, upper and lower eyelids: Secondary | ICD-10-CM | POA: Diagnosis not present

## 2023-07-09 DIAGNOSIS — G35 Multiple sclerosis: Secondary | ICD-10-CM | POA: Diagnosis not present

## 2023-07-09 DIAGNOSIS — H0102B Squamous blepharitis left eye, upper and lower eyelids: Secondary | ICD-10-CM | POA: Diagnosis not present

## 2023-07-09 DIAGNOSIS — H2513 Age-related nuclear cataract, bilateral: Secondary | ICD-10-CM | POA: Diagnosis not present

## 2023-07-09 DIAGNOSIS — H40033 Anatomical narrow angle, bilateral: Secondary | ICD-10-CM | POA: Diagnosis not present

## 2023-07-11 DIAGNOSIS — N6489 Other specified disorders of breast: Secondary | ICD-10-CM | POA: Diagnosis not present

## 2023-07-11 DIAGNOSIS — Z09 Encounter for follow-up examination after completed treatment for conditions other than malignant neoplasm: Secondary | ICD-10-CM | POA: Diagnosis not present

## 2023-07-11 DIAGNOSIS — Z853 Personal history of malignant neoplasm of breast: Secondary | ICD-10-CM | POA: Diagnosis not present

## 2023-07-17 DIAGNOSIS — E119 Type 2 diabetes mellitus without complications: Secondary | ICD-10-CM | POA: Diagnosis not present

## 2023-07-17 DIAGNOSIS — E039 Hypothyroidism, unspecified: Secondary | ICD-10-CM | POA: Diagnosis not present

## 2023-07-17 DIAGNOSIS — J45909 Unspecified asthma, uncomplicated: Secondary | ICD-10-CM | POA: Diagnosis not present

## 2023-07-17 DIAGNOSIS — K311 Adult hypertrophic pyloric stenosis: Secondary | ICD-10-CM | POA: Diagnosis not present

## 2023-07-17 DIAGNOSIS — K219 Gastro-esophageal reflux disease without esophagitis: Secondary | ICD-10-CM | POA: Diagnosis not present

## 2023-07-17 DIAGNOSIS — I10 Essential (primary) hypertension: Secondary | ICD-10-CM | POA: Diagnosis not present

## 2023-07-17 DIAGNOSIS — K222 Esophageal obstruction: Secondary | ICD-10-CM | POA: Diagnosis not present

## 2023-07-17 DIAGNOSIS — E271 Primary adrenocortical insufficiency: Secondary | ICD-10-CM | POA: Diagnosis not present

## 2023-07-17 DIAGNOSIS — K2289 Other specified disease of esophagus: Secondary | ICD-10-CM | POA: Diagnosis not present

## 2023-07-17 DIAGNOSIS — K3189 Other diseases of stomach and duodenum: Secondary | ICD-10-CM | POA: Diagnosis not present

## 2023-07-17 DIAGNOSIS — Z79899 Other long term (current) drug therapy: Secondary | ICD-10-CM | POA: Diagnosis not present

## 2023-07-17 DIAGNOSIS — Z9049 Acquired absence of other specified parts of digestive tract: Secondary | ICD-10-CM | POA: Diagnosis not present

## 2023-07-25 DIAGNOSIS — H40032 Anatomical narrow angle, left eye: Secondary | ICD-10-CM | POA: Diagnosis not present

## 2023-08-01 DIAGNOSIS — F411 Generalized anxiety disorder: Secondary | ICD-10-CM | POA: Diagnosis not present

## 2023-08-01 DIAGNOSIS — T887XXA Unspecified adverse effect of drug or medicament, initial encounter: Secondary | ICD-10-CM | POA: Diagnosis not present

## 2023-08-01 DIAGNOSIS — R1310 Dysphagia, unspecified: Secondary | ICD-10-CM | POA: Diagnosis not present

## 2023-08-01 DIAGNOSIS — R110 Nausea: Secondary | ICD-10-CM | POA: Diagnosis not present

## 2023-08-10 DIAGNOSIS — G35 Multiple sclerosis: Secondary | ICD-10-CM | POA: Diagnosis not present

## 2023-08-10 DIAGNOSIS — H0102A Squamous blepharitis right eye, upper and lower eyelids: Secondary | ICD-10-CM | POA: Diagnosis not present

## 2023-08-10 DIAGNOSIS — H40033 Anatomical narrow angle, bilateral: Secondary | ICD-10-CM | POA: Diagnosis not present

## 2023-08-10 DIAGNOSIS — H16121 Filamentary keratitis, right eye: Secondary | ICD-10-CM | POA: Diagnosis not present

## 2023-08-10 DIAGNOSIS — H2513 Age-related nuclear cataract, bilateral: Secondary | ICD-10-CM | POA: Diagnosis not present

## 2023-08-10 DIAGNOSIS — H0102B Squamous blepharitis left eye, upper and lower eyelids: Secondary | ICD-10-CM | POA: Diagnosis not present

## 2023-08-21 DIAGNOSIS — M4726 Other spondylosis with radiculopathy, lumbar region: Secondary | ICD-10-CM | POA: Diagnosis not present

## 2023-08-21 DIAGNOSIS — M41115 Juvenile idiopathic scoliosis, thoracolumbar region: Secondary | ICD-10-CM | POA: Diagnosis not present

## 2023-08-21 DIAGNOSIS — G894 Chronic pain syndrome: Secondary | ICD-10-CM | POA: Diagnosis not present

## 2023-08-21 DIAGNOSIS — Z79891 Long term (current) use of opiate analgesic: Secondary | ICD-10-CM | POA: Diagnosis not present

## 2023-08-24 DIAGNOSIS — M810 Age-related osteoporosis without current pathological fracture: Secondary | ICD-10-CM | POA: Diagnosis not present

## 2023-08-24 DIAGNOSIS — E2749 Other adrenocortical insufficiency: Secondary | ICD-10-CM | POA: Diagnosis not present

## 2023-08-24 DIAGNOSIS — E039 Hypothyroidism, unspecified: Secondary | ICD-10-CM | POA: Diagnosis not present

## 2023-08-24 DIAGNOSIS — E538 Deficiency of other specified B group vitamins: Secondary | ICD-10-CM | POA: Diagnosis not present

## 2023-08-24 DIAGNOSIS — I10 Essential (primary) hypertension: Secondary | ICD-10-CM | POA: Diagnosis not present

## 2023-09-11 DIAGNOSIS — Z6823 Body mass index (BMI) 23.0-23.9, adult: Secondary | ICD-10-CM | POA: Diagnosis not present

## 2023-09-11 DIAGNOSIS — E538 Deficiency of other specified B group vitamins: Secondary | ICD-10-CM | POA: Diagnosis not present

## 2023-09-11 DIAGNOSIS — M419 Scoliosis, unspecified: Secondary | ICD-10-CM | POA: Diagnosis not present

## 2023-09-11 DIAGNOSIS — F411 Generalized anxiety disorder: Secondary | ICD-10-CM | POA: Diagnosis not present

## 2023-09-20 DIAGNOSIS — L821 Other seborrheic keratosis: Secondary | ICD-10-CM | POA: Diagnosis not present

## 2023-09-20 DIAGNOSIS — L578 Other skin changes due to chronic exposure to nonionizing radiation: Secondary | ICD-10-CM | POA: Diagnosis not present

## 2023-09-20 DIAGNOSIS — L850 Acquired ichthyosis: Secondary | ICD-10-CM | POA: Diagnosis not present

## 2023-10-16 DIAGNOSIS — Z7982 Long term (current) use of aspirin: Secondary | ICD-10-CM | POA: Diagnosis not present

## 2023-10-16 DIAGNOSIS — Z79890 Hormone replacement therapy (postmenopausal): Secondary | ICD-10-CM | POA: Diagnosis not present

## 2023-10-16 DIAGNOSIS — K2289 Other specified disease of esophagus: Secondary | ICD-10-CM | POA: Diagnosis not present

## 2023-10-16 DIAGNOSIS — M549 Dorsalgia, unspecified: Secondary | ICD-10-CM | POA: Diagnosis not present

## 2023-10-16 DIAGNOSIS — Z853 Personal history of malignant neoplasm of breast: Secondary | ICD-10-CM | POA: Diagnosis not present

## 2023-10-16 DIAGNOSIS — G8929 Other chronic pain: Secondary | ICD-10-CM | POA: Diagnosis not present

## 2023-10-16 DIAGNOSIS — I10 Essential (primary) hypertension: Secondary | ICD-10-CM | POA: Diagnosis not present

## 2023-10-16 DIAGNOSIS — Z9013 Acquired absence of bilateral breasts and nipples: Secondary | ICD-10-CM | POA: Diagnosis not present

## 2023-10-16 DIAGNOSIS — K259 Gastric ulcer, unspecified as acute or chronic, without hemorrhage or perforation: Secondary | ICD-10-CM | POA: Diagnosis not present

## 2023-10-16 DIAGNOSIS — F419 Anxiety disorder, unspecified: Secondary | ICD-10-CM | POA: Diagnosis not present

## 2023-10-16 DIAGNOSIS — Z90710 Acquired absence of both cervix and uterus: Secondary | ICD-10-CM | POA: Diagnosis not present

## 2023-10-16 DIAGNOSIS — K3189 Other diseases of stomach and duodenum: Secondary | ICD-10-CM | POA: Diagnosis not present

## 2023-10-16 DIAGNOSIS — Z79899 Other long term (current) drug therapy: Secondary | ICD-10-CM | POA: Diagnosis not present

## 2023-10-16 DIAGNOSIS — E039 Hypothyroidism, unspecified: Secondary | ICD-10-CM | POA: Diagnosis not present

## 2023-10-16 DIAGNOSIS — J45909 Unspecified asthma, uncomplicated: Secondary | ICD-10-CM | POA: Diagnosis not present

## 2023-10-16 DIAGNOSIS — E271 Primary adrenocortical insufficiency: Secondary | ICD-10-CM | POA: Diagnosis not present

## 2023-10-16 DIAGNOSIS — G35 Multiple sclerosis: Secondary | ICD-10-CM | POA: Diagnosis not present

## 2023-10-16 DIAGNOSIS — K219 Gastro-esophageal reflux disease without esophagitis: Secondary | ICD-10-CM | POA: Diagnosis not present

## 2023-10-16 DIAGNOSIS — K222 Esophageal obstruction: Secondary | ICD-10-CM | POA: Diagnosis not present

## 2023-10-17 DIAGNOSIS — Z79891 Long term (current) use of opiate analgesic: Secondary | ICD-10-CM | POA: Diagnosis not present

## 2023-10-17 DIAGNOSIS — M41115 Juvenile idiopathic scoliosis, thoracolumbar region: Secondary | ICD-10-CM | POA: Diagnosis not present

## 2023-10-17 DIAGNOSIS — M4726 Other spondylosis with radiculopathy, lumbar region: Secondary | ICD-10-CM | POA: Diagnosis not present

## 2023-10-17 DIAGNOSIS — G894 Chronic pain syndrome: Secondary | ICD-10-CM | POA: Diagnosis not present

## 2023-11-19 DIAGNOSIS — Z6823 Body mass index (BMI) 23.0-23.9, adult: Secondary | ICD-10-CM | POA: Diagnosis not present

## 2023-11-19 DIAGNOSIS — R300 Dysuria: Secondary | ICD-10-CM | POA: Diagnosis not present

## 2023-11-19 DIAGNOSIS — F411 Generalized anxiety disorder: Secondary | ICD-10-CM | POA: Diagnosis not present

## 2023-12-12 DIAGNOSIS — T85192A Other mechanical complication of implanted electronic neurostimulator (electrode) of spinal cord, initial encounter: Secondary | ICD-10-CM | POA: Diagnosis not present

## 2023-12-19 DIAGNOSIS — M4726 Other spondylosis with radiculopathy, lumbar region: Secondary | ICD-10-CM | POA: Diagnosis not present

## 2023-12-19 DIAGNOSIS — M41115 Juvenile idiopathic scoliosis, thoracolumbar region: Secondary | ICD-10-CM | POA: Diagnosis not present

## 2023-12-19 DIAGNOSIS — G894 Chronic pain syndrome: Secondary | ICD-10-CM | POA: Diagnosis not present

## 2023-12-19 DIAGNOSIS — Z79891 Long term (current) use of opiate analgesic: Secondary | ICD-10-CM | POA: Diagnosis not present

## 2023-12-25 DIAGNOSIS — T85192A Other mechanical complication of implanted electronic neurostimulator (electrode) of spinal cord, initial encounter: Secondary | ICD-10-CM | POA: Diagnosis not present

## 2023-12-25 DIAGNOSIS — M545 Low back pain, unspecified: Secondary | ICD-10-CM | POA: Diagnosis not present

## 2024-01-16 DIAGNOSIS — T85192A Other mechanical complication of implanted electronic neurostimulator (electrode) of spinal cord, initial encounter: Secondary | ICD-10-CM | POA: Diagnosis not present

## 2024-01-24 ENCOUNTER — Encounter (HOSPITAL_COMMUNITY): Payer: Self-pay

## 2024-01-24 ENCOUNTER — Inpatient Hospital Stay (HOSPITAL_COMMUNITY)
Admission: EM | Admit: 2024-01-24 | Discharge: 2024-01-31 | DRG: 871 | Disposition: A | Discharge status: Home Health | Payer: Medicare Other | Attending: Internal Medicine | Admitting: Internal Medicine

## 2024-01-24 ENCOUNTER — Emergency Department (HOSPITAL_COMMUNITY): Payer: Medicare Other

## 2024-01-24 DIAGNOSIS — R6520 Severe sepsis without septic shock: Secondary | ICD-10-CM | POA: Diagnosis present

## 2024-01-24 DIAGNOSIS — G35 Multiple sclerosis: Secondary | ICD-10-CM | POA: Diagnosis present

## 2024-01-24 DIAGNOSIS — A419 Sepsis, unspecified organism: Secondary | ICD-10-CM | POA: Diagnosis present

## 2024-01-24 DIAGNOSIS — I10 Essential (primary) hypertension: Secondary | ICD-10-CM | POA: Diagnosis not present

## 2024-01-24 DIAGNOSIS — Z91041 Radiographic dye allergy status: Secondary | ICD-10-CM | POA: Diagnosis not present

## 2024-01-24 DIAGNOSIS — Z853 Personal history of malignant neoplasm of breast: Secondary | ICD-10-CM

## 2024-01-24 DIAGNOSIS — R0603 Acute respiratory distress: Secondary | ICD-10-CM | POA: Diagnosis not present

## 2024-01-24 DIAGNOSIS — G4489 Other headache syndrome: Secondary | ICD-10-CM | POA: Diagnosis not present

## 2024-01-24 DIAGNOSIS — Z801 Family history of malignant neoplasm of trachea, bronchus and lung: Secondary | ICD-10-CM

## 2024-01-24 DIAGNOSIS — G894 Chronic pain syndrome: Secondary | ICD-10-CM | POA: Diagnosis present

## 2024-01-24 DIAGNOSIS — E872 Acidosis, unspecified: Secondary | ICD-10-CM | POA: Diagnosis present

## 2024-01-24 DIAGNOSIS — Z888 Allergy status to other drugs, medicaments and biological substances status: Secondary | ICD-10-CM

## 2024-01-24 DIAGNOSIS — R000 Tachycardia, unspecified: Secondary | ICD-10-CM | POA: Diagnosis not present

## 2024-01-24 DIAGNOSIS — Z7982 Long term (current) use of aspirin: Secondary | ICD-10-CM

## 2024-01-24 DIAGNOSIS — G2581 Restless legs syndrome: Secondary | ICD-10-CM | POA: Diagnosis present

## 2024-01-24 DIAGNOSIS — Z881 Allergy status to other antibiotic agents status: Secondary | ICD-10-CM | POA: Diagnosis not present

## 2024-01-24 DIAGNOSIS — J9601 Acute respiratory failure with hypoxia: Secondary | ICD-10-CM | POA: Diagnosis present

## 2024-01-24 DIAGNOSIS — G4733 Obstructive sleep apnea (adult) (pediatric): Secondary | ICD-10-CM | POA: Diagnosis present

## 2024-01-24 DIAGNOSIS — R918 Other nonspecific abnormal finding of lung field: Secondary | ICD-10-CM | POA: Diagnosis not present

## 2024-01-24 DIAGNOSIS — J440 Chronic obstructive pulmonary disease with acute lower respiratory infection: Secondary | ICD-10-CM | POA: Diagnosis not present

## 2024-01-24 DIAGNOSIS — Z803 Family history of malignant neoplasm of breast: Secondary | ICD-10-CM

## 2024-01-24 DIAGNOSIS — Z883 Allergy status to other anti-infective agents status: Secondary | ICD-10-CM

## 2024-01-24 DIAGNOSIS — E039 Hypothyroidism, unspecified: Secondary | ICD-10-CM | POA: Diagnosis not present

## 2024-01-24 DIAGNOSIS — Z880 Allergy status to penicillin: Secondary | ICD-10-CM

## 2024-01-24 DIAGNOSIS — F419 Anxiety disorder, unspecified: Secondary | ICD-10-CM | POA: Diagnosis present

## 2024-01-24 DIAGNOSIS — Z79891 Long term (current) use of opiate analgesic: Secondary | ICD-10-CM

## 2024-01-24 DIAGNOSIS — J984 Other disorders of lung: Secondary | ICD-10-CM | POA: Diagnosis not present

## 2024-01-24 DIAGNOSIS — Z79899 Other long term (current) drug therapy: Secondary | ICD-10-CM

## 2024-01-24 DIAGNOSIS — Z90710 Acquired absence of both cervix and uterus: Secondary | ICD-10-CM

## 2024-01-24 DIAGNOSIS — M797 Fibromyalgia: Secondary | ICD-10-CM | POA: Diagnosis not present

## 2024-01-24 DIAGNOSIS — Z9011 Acquired absence of right breast and nipple: Secondary | ICD-10-CM

## 2024-01-24 DIAGNOSIS — E271 Primary adrenocortical insufficiency: Secondary | ICD-10-CM | POA: Diagnosis not present

## 2024-01-24 DIAGNOSIS — Z9682 Presence of neurostimulator: Secondary | ICD-10-CM | POA: Diagnosis not present

## 2024-01-24 DIAGNOSIS — K219 Gastro-esophageal reflux disease without esophagitis: Secondary | ICD-10-CM | POA: Diagnosis present

## 2024-01-24 DIAGNOSIS — Z1152 Encounter for screening for COVID-19: Secondary | ICD-10-CM

## 2024-01-24 DIAGNOSIS — J219 Acute bronchiolitis, unspecified: Secondary | ICD-10-CM | POA: Diagnosis present

## 2024-01-24 DIAGNOSIS — Z808 Family history of malignant neoplasm of other organs or systems: Secondary | ICD-10-CM

## 2024-01-24 DIAGNOSIS — Z9049 Acquired absence of other specified parts of digestive tract: Secondary | ICD-10-CM

## 2024-01-24 DIAGNOSIS — R0902 Hypoxemia: Secondary | ICD-10-CM | POA: Diagnosis not present

## 2024-01-24 DIAGNOSIS — Z885 Allergy status to narcotic agent status: Secondary | ICD-10-CM

## 2024-01-24 DIAGNOSIS — Z79890 Hormone replacement therapy (postmenopausal): Secondary | ICD-10-CM

## 2024-01-24 DIAGNOSIS — T85192A Other mechanical complication of implanted electronic neurostimulator (electrode) of spinal cord, initial encounter: Secondary | ICD-10-CM | POA: Diagnosis present

## 2024-01-24 DIAGNOSIS — E230 Hypopituitarism: Secondary | ICD-10-CM | POA: Diagnosis present

## 2024-01-24 DIAGNOSIS — J189 Pneumonia, unspecified organism: Secondary | ICD-10-CM | POA: Diagnosis present

## 2024-01-24 DIAGNOSIS — R0689 Other abnormalities of breathing: Secondary | ICD-10-CM | POA: Diagnosis not present

## 2024-01-24 DIAGNOSIS — J479 Bronchiectasis, uncomplicated: Secondary | ICD-10-CM | POA: Diagnosis not present

## 2024-01-24 DIAGNOSIS — Z882 Allergy status to sulfonamides status: Secondary | ICD-10-CM

## 2024-01-24 DIAGNOSIS — I959 Hypotension, unspecified: Secondary | ICD-10-CM | POA: Diagnosis present

## 2024-01-24 DIAGNOSIS — J9621 Acute and chronic respiratory failure with hypoxia: Secondary | ICD-10-CM | POA: Diagnosis not present

## 2024-01-24 DIAGNOSIS — Z7952 Long term (current) use of systemic steroids: Secondary | ICD-10-CM

## 2024-01-24 LAB — CBC WITH DIFFERENTIAL/PLATELET
Abs Immature Granulocytes: 0.05 10*3/uL (ref 0.00–0.07)
Basophils Absolute: 0 10*3/uL (ref 0.0–0.1)
Basophils Relative: 0 %
Eosinophils Absolute: 0 10*3/uL (ref 0.0–0.5)
Eosinophils Relative: 0 %
HCT: 43 % (ref 36.0–46.0)
Hemoglobin: 14.2 g/dL (ref 12.0–15.0)
Immature Granulocytes: 1 %
Lymphocytes Relative: 4 %
Lymphs Abs: 0.4 10*3/uL — ABNORMAL LOW (ref 0.7–4.0)
MCH: 29.2 pg (ref 26.0–34.0)
MCHC: 33 g/dL (ref 30.0–36.0)
MCV: 88.5 fL (ref 80.0–100.0)
Monocytes Absolute: 0.3 10*3/uL (ref 0.1–1.0)
Monocytes Relative: 3 %
Neutro Abs: 9 10*3/uL — ABNORMAL HIGH (ref 1.7–7.7)
Neutrophils Relative %: 92 %
Platelets: 233 10*3/uL (ref 150–400)
RBC: 4.86 MIL/uL (ref 3.87–5.11)
RDW: 14.4 % (ref 11.5–15.5)
WBC: 9.8 10*3/uL (ref 4.0–10.5)
nRBC: 0 % (ref 0.0–0.2)

## 2024-01-24 LAB — URINALYSIS, W/ REFLEX TO CULTURE (INFECTION SUSPECTED)
Bacteria, UA: NONE SEEN
Bilirubin Urine: NEGATIVE
Glucose, UA: NEGATIVE mg/dL
Hgb urine dipstick: NEGATIVE
Ketones, ur: NEGATIVE mg/dL
Leukocytes,Ua: NEGATIVE
Nitrite: NEGATIVE
Protein, ur: NEGATIVE mg/dL
Specific Gravity, Urine: 1.014 (ref 1.005–1.030)
pH: 7 (ref 5.0–8.0)

## 2024-01-24 LAB — BLOOD GAS, VENOUS
Acid-base deficit: 0.7 mmol/L (ref 0.0–2.0)
Bicarbonate: 25.8 mmol/L (ref 20.0–28.0)
O2 Saturation: 72.7 %
Patient temperature: 37
pCO2, Ven: 49 mm[Hg] (ref 44–60)
pH, Ven: 7.33 (ref 7.25–7.43)
pO2, Ven: 42 mm[Hg] (ref 32–45)

## 2024-01-24 LAB — BLOOD GAS, ARTERIAL
Acid-base deficit: 0.6 mmol/L (ref 0.0–2.0)
Bicarbonate: 24.9 mmol/L (ref 20.0–28.0)
Drawn by: 270211
O2 Content: 100 L/min
O2 Saturation: 98.6 %
Patient temperature: 37
pCO2 arterial: 43 mm[Hg] (ref 32–48)
pH, Arterial: 7.37 (ref 7.35–7.45)
pO2, Arterial: 90 mm[Hg] (ref 83–108)

## 2024-01-24 LAB — COMPREHENSIVE METABOLIC PANEL
ALT: 21 U/L (ref 0–44)
AST: 43 U/L — ABNORMAL HIGH (ref 15–41)
Albumin: 3.4 g/dL — ABNORMAL LOW (ref 3.5–5.0)
Alkaline Phosphatase: 42 U/L (ref 38–126)
Anion gap: 12 (ref 5–15)
BUN: 22 mg/dL (ref 8–23)
CO2: 22 mmol/L (ref 22–32)
Calcium: 8.1 mg/dL — ABNORMAL LOW (ref 8.9–10.3)
Chloride: 101 mmol/L (ref 98–111)
Creatinine, Ser: 0.61 mg/dL (ref 0.44–1.00)
GFR, Estimated: >60 mL/min (ref >60)
Glucose, Bld: 94 mg/dL (ref 70–99)
Potassium: 3.4 mmol/L — ABNORMAL LOW (ref 3.5–5.1)
Sodium: 135 mmol/L (ref 135–145)
Total Bilirubin: 0.6 mg/dL (ref 0.0–1.2)
Total Protein: 7 g/dL (ref 6.5–8.1)

## 2024-01-24 LAB — APTT: aPTT: 24 s (ref 24–36)

## 2024-01-24 LAB — PROTIME-INR
INR: 1 (ref 0.8–1.2)
Prothrombin Time: 13.8 s (ref 11.4–15.2)

## 2024-01-24 LAB — BRAIN NATRIURETIC PEPTIDE: B Natriuretic Peptide: 122.1 pg/mL — ABNORMAL HIGH (ref 0.0–100.0)

## 2024-01-24 LAB — I-STAT CHEM 8, ED
BUN: 26 mg/dL — ABNORMAL HIGH (ref 8–23)
Calcium, Ion: 1.07 mmol/L — ABNORMAL LOW (ref 1.15–1.40)
Chloride: 100 mmol/L (ref 98–111)
Creatinine, Ser: 0.7 mg/dL (ref 0.44–1.00)
Glucose, Bld: 99 mg/dL (ref 70–99)
HCT: 43 % (ref 36.0–46.0)
Hemoglobin: 14.6 g/dL (ref 12.0–15.0)
Potassium: 3.6 mmol/L (ref 3.5–5.1)
Sodium: 138 mmol/L (ref 135–145)
TCO2: 26 mmol/L (ref 22–32)

## 2024-01-24 LAB — RESP PANEL BY RT-PCR (RSV, FLU A&B, COVID)  RVPGX2
Influenza A by PCR: NEGATIVE
Influenza B by PCR: NEGATIVE
Resp Syncytial Virus by PCR: NEGATIVE
SARS Coronavirus 2 by RT PCR: NEGATIVE

## 2024-01-24 LAB — I-STAT CG4 LACTIC ACID, ED
Lactic Acid, Venous: 2 mmol/L (ref 0.5–1.9)
Lactic Acid, Venous: 1.7 mmol/L (ref 0.5–1.9)

## 2024-01-24 MED ORDER — DIPHENHYDRAMINE HCL 50 MG/ML IJ SOLN: 50.0000 mg | Freq: Once | INTRAMUSCULAR | Status: DC

## 2024-01-24 MED ORDER — VANCOMYCIN HCL IN DEXTROSE 1-5 GM/200ML-% IV SOLN
1000.0000 mg | Freq: Once | INTRAVENOUS | Status: AC
  Administered 2024-01-24: 1000 mg via INTRAVENOUS
  Filled 2024-01-24: qty 200

## 2024-01-24 MED ORDER — METRONIDAZOLE 500 MG/100ML IV SOLN
500.0000 mg | Freq: Once | INTRAVENOUS | Status: AC
  Administered 2024-01-24: 500 mg via INTRAVENOUS
  Filled 2024-01-24: qty 100

## 2024-01-24 MED ORDER — ASPIRIN 325 MG PO TBEC
325.0000 mg | DELAYED_RELEASE_TABLET | Freq: Two times a day (BID) | ORAL | Status: DC | PRN
  Administered 2024-01-27 (×2): 325 mg via ORAL
  Filled 2024-01-24 (×2): qty 1

## 2024-01-24 MED ORDER — SENNOSIDES-DOCUSATE SODIUM 8.6-50 MG PO TABS: 1.0000 | ORAL_TABLET | Freq: Every day | ORAL | Status: DC

## 2024-01-24 MED ORDER — SODIUM CHLORIDE 0.9 % IV SOLN
2.0000 g | Freq: Two times a day (BID) | INTRAVENOUS | Status: DC
  Administered 2024-01-25 – 2024-01-26 (×5): 2 g via INTRAVENOUS
  Filled 2024-01-24 (×5): qty 12.5

## 2024-01-24 MED ORDER — ORAL CARE MOUTH RINSE: 15.0000 mL | OROMUCOSAL | Status: DC | PRN

## 2024-01-24 MED ORDER — MORPHINE SULFATE 15 MG PO TABS
30.0000 mg | ORAL_TABLET | ORAL | Status: DC
  Administered 2024-01-25 – 2024-01-31 (×26): 30 mg via ORAL
  Filled 2024-01-24 (×26): qty 2

## 2024-01-24 MED ORDER — ENOXAPARIN SODIUM 40 MG/0.4ML IJ SOSY
40.0000 mg | PREFILLED_SYRINGE | INTRAMUSCULAR | Status: DC
  Filled 2024-01-24 (×6): qty 0.4

## 2024-01-24 MED ORDER — ACETAMINOPHEN 325 MG PO TABS
650.0000 mg | ORAL_TABLET | Freq: Four times a day (QID) | ORAL | Status: DC | PRN
  Administered 2024-01-24 – 2024-01-30 (×4): 650 mg via ORAL
  Filled 2024-01-24 (×5): qty 2

## 2024-01-24 MED ORDER — LEVOTHYROXINE SODIUM 75 MCG PO TABS
75.0000 ug | ORAL_TABLET | ORAL | Status: DC
  Administered 2024-01-25 – 2024-01-31 (×4): 75 ug via ORAL
  Filled 2024-01-24 (×4): qty 1

## 2024-01-24 MED ORDER — LEVOTHYROXINE SODIUM 50 MCG PO TABS: 50.0000 ug | ORAL_TABLET | ORAL | Status: DC

## 2024-01-24 MED ORDER — PANTOPRAZOLE SODIUM 40 MG IV SOLR
40.0000 mg | Freq: Every day | INTRAVENOUS | Status: DC
  Administered 2024-01-25 – 2024-01-27 (×3): 40 mg via INTRAVENOUS
  Filled 2024-01-24 (×3): qty 10

## 2024-01-24 MED ORDER — LACTATED RINGERS IV BOLUS (SEPSIS): 250.0000 mL | Freq: Once | INTRAVENOUS | Status: DC

## 2024-01-24 MED ORDER — LACTATED RINGERS IV SOLN: INTRAVENOUS | Status: DC

## 2024-01-24 MED ORDER — ONDANSETRON HCL 4 MG/2ML IJ SOLN
4.0000 mg | Freq: Four times a day (QID) | INTRAMUSCULAR | Status: DC | PRN
  Administered 2024-01-25 – 2024-01-27 (×6): 4 mg via INTRAVENOUS
  Filled 2024-01-24 (×7): qty 2

## 2024-01-24 MED ORDER — LEVOTHYROXINE SODIUM 50 MCG PO TABS
50.0000 ug | ORAL_TABLET | ORAL | Status: DC
  Administered 2024-01-26 – 2024-01-30 (×3): 50 ug via ORAL
  Filled 2024-01-24: qty 2
  Filled 2024-01-24 (×2): qty 1

## 2024-01-24 MED ORDER — CHLORHEXIDINE GLUCONATE CLOTH 2 % EX PADS
6.0000 | MEDICATED_PAD | Freq: Every day | CUTANEOUS | Status: DC
  Administered 2024-01-25 – 2024-01-29 (×2): 6 via TOPICAL

## 2024-01-24 MED ORDER — LACTATED RINGERS IV SOLN
150.0000 mL/h | INTRAVENOUS | Status: DC
  Administered 2024-01-24 – 2024-01-25 (×2): 150 mL/h via INTRAVENOUS

## 2024-01-24 MED ORDER — MELATONIN 3 MG PO TABS
3.0000 mg | ORAL_TABLET | Freq: Every evening | ORAL | Status: DC | PRN
  Administered 2024-01-27: 3 mg via ORAL
  Filled 2024-01-24 (×2): qty 1

## 2024-01-24 MED ORDER — ACETAMINOPHEN 650 MG RE SUPP: 650.0000 mg | Freq: Four times a day (QID) | RECTAL | Status: DC | PRN

## 2024-01-24 MED ORDER — BISACODYL 5 MG PO TBEC: 5.0000 mg | DELAYED_RELEASE_TABLET | Freq: Every day | ORAL | Status: DC | PRN

## 2024-01-24 MED ORDER — PREDNISONE 1 MG PO TABS
4.0000 mg | ORAL_TABLET | Freq: Every day | ORAL | Status: DC
  Administered 2024-01-25 – 2024-01-30 (×6): 4 mg via ORAL
  Filled 2024-01-24 (×6): qty 4

## 2024-01-24 MED ORDER — SODIUM CHLORIDE 0.9 % IV SOLN
2.0000 g | Freq: Once | INTRAVENOUS | Status: AC
  Administered 2024-01-24: 2 g via INTRAVENOUS
  Filled 2024-01-24: qty 12.5

## 2024-01-24 MED ORDER — LACTATED RINGERS IV SOLN
INTRAVENOUS | Status: DC
  Administered 2024-01-24: 250 mL via INTRAVENOUS

## 2024-01-24 MED ORDER — LACTATED RINGERS IV BOLUS (SEPSIS)
500.0000 mL | Freq: Once | INTRAVENOUS | Status: AC
  Administered 2024-01-24: 500 mL via INTRAVENOUS

## 2024-01-24 MED ORDER — METHYLPREDNISOLONE SODIUM SUCC 40 MG IJ SOLR: 40.0000 mg | Freq: Once | INTRAMUSCULAR | Status: DC

## 2024-01-24 MED ORDER — LACTATED RINGERS IV BOLUS (SEPSIS)
1000.0000 mL | Freq: Once | INTRAVENOUS | Status: AC
  Administered 2024-01-24: 1000 mL via INTRAVENOUS

## 2024-01-24 MED ORDER — ONDANSETRON HCL 4 MG/2ML IJ SOLN
4.0000 mg | Freq: Once | INTRAMUSCULAR | Status: AC
  Administered 2024-01-24: 4 mg via INTRAVENOUS
  Filled 2024-01-24: qty 2

## 2024-01-24 MED ORDER — LEVOTHYROXINE SODIUM 25 MCG PO TABS: 75.0000 ug | ORAL_TABLET | ORAL | Status: DC

## 2024-01-24 MED ORDER — VANCOMYCIN HCL 750 MG/150ML IV SOLN
750.0000 mg | INTRAVENOUS | Status: DC
  Filled 2024-01-24: qty 150

## 2024-01-24 NOTE — ED Notes (Signed)
Pt ox sats dropped to 85% while on 6L Nasal Canula - placed pt on non re breather mask @ 15L with rebound to 94%  Respiratory contacted and is at bedside.

## 2024-01-24 NOTE — ED Provider Notes (Signed)
I provided a substantive portion of the care of this patient.  I personally made/approved the management plan for this patient and take responsibility for the patient management.  EKG Interpretation Date/Time:  Thursday January 24 2024 12:35:44 EST Ventricular Rate:  128 PR Interval:  129 QRS Duration:  80 QT Interval:  285 QTC Calculation: 415 R Axis:   23  Text Interpretation: Sinus tachycardia Probable left atrial enlargement Probable left ventricular hypertrophy ST depr, consider ischemia, inferior leads ST elevation, consider anterior injury Confirmed by Lorre Nick ([id number]) on 01/24/2024 1:68:101 PM   73 year old female who presents with nausea vomiting diarrhea since yesterday.  O2 sats were 80% on room air.  Patient is febrile here and clinically has pneumonia.  Chest x-ray is pending at this time.  Code sepsis protocol started and patient will require inpatient hospitalization   Lorre Nick, MD 01/24/24 1317

## 2024-01-24 NOTE — Sepsis Progress Note (Addendum)
Elink monitoring for the code sepsis protocol.  Blood cultures & antibiotics given 6 hours prior to code sepsis order.

## 2024-01-24 NOTE — Progress Notes (Signed)
Pharmacy Antibiotic Note  Daisy [name] is a 73 y.o. female admitted on 01/24/2024 with N/V/D, cough and fever PTA. Pharmacy has been consulted for vancomycin and cefepime dosing for sepsis.  Plan: -Vancomycin 1000 mg given in the ED, follow with 750 mg IV q24h -Cefepime 2 g IV q12h -Continue to follow renal function, cultures and clinical progress for dose adjustments and de-escalation as indicated  Weight: 52.2 kg (115 lb)  Temp (24hrs), Avg:101 F (38.3 C), Min:99.5 F (37.5 C), Max:102.5 F (39.2 C)  Recent Labs  Lab 01/24/24 1309 01/24/24 1345  WBC 9.8  --   CREATININE 0.61 0.70  LATICACIDVEN  --  2.0*    CrCl cannot be calculated (Unknown ideal weight.).    Allergies  Allergen Reactions   Alendronate Other (See Comments)    ESOPHAGEAL IRRITATION DANGER OF ESOPHAGEAL PERF.   Gabapentin Anaphylaxis   Iodinated Contrast Media Anaphylaxis and Other (See Comments)    Fever and blood clots, also ANAPHYLACTIC REACTION!-kmr-10/16/05   Iodine-131 Anaphylaxis   Iohexol Anaphylaxis and Other (See Comments)    ANAPHYLACTIC REACTION!-kmr-10/16/05    Procaine Anaphylaxis, Rash and Other (See Comments)    Novocaine caused shaking, rash, chest heaviness, made patient feel sick     Sulfa Antibiotics Swelling and Other (See Comments)    SWELLING REACTION UNSPECIFIED  HALLUCINATIONS    Terbinafine Hcl Anaphylaxis and Other (See Comments)    THROAT CLOSED   Terbinafine Hcl Anaphylaxis and Other (See Comments)    THROAT CLOSED   Amlodipine Swelling and Other (See Comments)    SWELLING REACTION (patient is currently taking with mild fluid retention)   Amoxicillin-Pot Clavulanate Diarrhea and Other (See Comments)    Bad headaches (tolerates PCN, per husband) Has patient had a PCN reaction causing immediate rash, facial/tongue/throat swelling, SOB or lightheadedness with hypotension: no Has patient had a PCN reaction causing severe rash involving mucus membranes or skin  necrosis: no Has patient had a PCN reaction that required hospitalization no Has patient had a PCN reaction occurring within the last 10 years: no If all of the above answers are "NO", then may proceed with Cephalosporin use.   Clarithromycin Anxiety and Other (See Comments)    Caused the patient to be jittery   Clindamycin/Lincomycin Other (See Comments)    Reaction not recalled   Compazine Anxiety and Other (See Comments)    Hyper and shaky   Dilaudid [Hydromorphone Hcl] Nausea And Vomiting and Other (See Comments)    HEADACHES   Guaifenesin Nausea And Vomiting, Swelling and Other (See Comments)    SWELLING REACTION UNSPECIFIED  HYPERACTIVITY ALSO   Guaifenesin Nausea And Vomiting, Swelling, Anxiety and Other (See Comments)    Headaches and hyperactivity (SWELLING REACTION UNSPECIFIED)   Hydromorphone Nausea And Vomiting and Other (See Comments)    HEADACHE >> MIGRAINE    Hydromorphone Hcl Nausea And Vomiting and Other (See Comments)    HEADACHES, also   Imiquimod Other (See Comments)    "Active and sick "   Lisinopril Cough   Methocarbamol Other (See Comments)    "Makes pt feel irritable "    Metoclopramide Other (See Comments)    "Hyper and shaky"    Metoclopramide Hcl Other (See Comments)    Hyper, shaky   Other Hives, Rash and Other (See Comments)    SOME TAPES CAUSE SKIN IRRITATION   Prochlorperazine Anxiety and Other (See Comments)    "Hyper and shaky"   Promethazine Anxiety and Other (See Comments)  DIZZINESS, also   Sucralfate Nausea Only and Other (See Comments)    Distends stomach and hyperactivity    Sulfamethoxazole-Trimethoprim Other (See Comments)    ALTERED MENTAL STATUS HALLUCINATIONS     Trifluoperazine Other (See Comments)    Hallucinations    Vancomycin Swelling and Other (See Comments)    SWELLING REACTION UNSPECIFIED    Zolpidem Nausea And Vomiting and Other (See Comments)    Caused the patient to be shaky, too   Buspar [Buspirone] Other  (See Comments)    Changed mental status in a negative manner   Clonazepam Other (See Comments)    Changed mental status in a negative manner   Metformin Other (See Comments)    Headaches and migraines     Antimicrobials this admission: Cefepime 2/20 >> Vancomycin 2/20 >>  Dose adjustments this admission: NA  Microbiology results: 2/20 BCx: pending   Thank you for allowing pharmacy to be a part of this patient's care.  Pricilla Riffle, PharmD, BCPS Clinical Pharmacist 01/24/2024 7:31 PM

## 2024-01-24 NOTE — Progress Notes (Signed)
Pt is still nauseated. Respiratory rate is 23. Md wants pt placed on bipap if respiratory rate gets over 30. Bipap order made prn.

## 2024-01-24 NOTE — H&P (Signed)
History and Physical    Patient: Daisy [name]:[id number] DOB: 08/07/1951 DOA: 01/24/2024 DOS: the patient was seen and examined on 01/24/2024 PCP: Daisy Floro, MD  Patient coming from: Home  Chief Complaint: No chief complaint on file.  HPI: Daisy [name] is a 73 y.o. female with medical history significant for breast cancer x 3 but cancer free now for 9 years.  She also has a history of Addison's disease on daily prednisone, obstructive sleep apnea but not on CPAP, and multiple sclerosis.  Despite this the patient is functional and independent and rarely has to be admitted to the hospital.  2 days ago she started feeling very dizzy.  She also had no appetite and a little diarrhea.  Then she developed a terrible dry painful cough.  She says her whole chest hurts when she coughs.  Today she got progressively more dizzy so much so that her husband had to help her to the bathroom which is not her baseline.  She came in for evaluation.  Her temperature at home was 103.  Her emergency department workup included a CT scan of her chest which revealed diffuse changes consistent with bronchiolitis.  With her temperature being elevated and her heart rate being in the 140s as well as an elevated respiratory rate and elevated lactic acid,  the patient did meet sepsis criteria.  At the time of my initial evaluation the patient's heart rate was in the 140s and her respiratory rate was in the 40s.  Was on 100% nonrebreather and her O2 sats were 96 to 97%.  She had already received sepsis dose fluids and IV vancomycin and cefepime.  BiPAP was ordered but the patient's vitals did improve and her respiratory rate went down to the 20s.  Her VBG was good so we will defer BiPAP at this time but the patient will be admitted to a stepdown bed for close monitoring.  Her blood pressure has remained good during her ED stay.  She is COVID, influenza, and RSV negative.  Review of Systems: As mentioned in the history  of present illness. All other systems reviewed and are negative. Past Medical History:  Diagnosis Date   Addison's disease (HCC)    Adenocarcinoma of breast (HCC) 07/13/1992   LEFT LUMPECTOMY AND CHEMO AND RADIATION;   NOW HAS RIGHT BREAST MASS 09/25/14 - PLANS MASTECTOMY    Adrenal insufficiency (HCC)    "w/secondary Addison's"   Anemia    Arthritis    Asthma    using incentive spirometer alot now   Brittle bone disease    Bruises easily    Chronic bronchitis    Chronic pain    DR. MARK PHILLIPS WITH PAIN CLINIC -  CHRONIC BACK PAIN AND SCOLIOSIS AND PREVIOUS BACK SURGERY AND NOW LUMBAR FRACTURE - PT HAS [employer] SPINAL STIMULATOR WHICH SHE CAN TURN ON AND OFF ; PT ON MORPINE AND CLONAZEPAM AND ROBAXIN   COPD (chronic obstructive pulmonary disease) (HCC)    DDD (degenerative disc disease), lumbar    Eye pain, right    last 4 months can be both eyes   Falls frequently    Family history of adverse reaction to anesthesia    ponv   Fibromyalgia    Fracture    LUMBAR 1 - FROM A FALL JUNE 2015 - PT HAS A LOT OF SEVERE BACK PAIN AND DIFFICULTY WALKING   GERD (gastroesophageal reflux disease)    H/O esophageal spasm    "I can have  as many as 6 TUMS before they stop"   H/O hiatal hernia    Head injury with loss of consciousness (HCC)    Headache(784.0)    "when I don't eat" (02/19/12)   Hepatitis B infection 1971   "in high school; after drinking after football player"   History of kidney stones    1 large stone now   History of urinary urgency    AZO HELPS - DOES TURN URINE ORANGE; DR. Isabel Caprice IS PT'S UROLOGIST   Hypertension    Hypothyroidism    Mass    right chest wall   Migraines    "much better now" (02/19/12)   Multiple sclerosis (HCC) 05/1988   Relasping and Remitting"   OSA (obstructive sleep apnea)    Paget's disease of breast (HCC) 2010   left   Pneumonia    PONV (postoperative nausea and vomiting)    one episode 1993 vomiting after surgery   Poor venous access     PT STATES HER PORT A CATH NOT TO BE USED FOR SURGERY/ IV'S   Positive TB test 2017   "ran deep in my father's family; went on INH for awhile; it caused migraines"   Radiation adverse effect    "100% blockage on left; 50% on right salivary; can drain w/massage"   Restless leg syndrome, controlled 02/19/12   "much better since being on CPAP; been on that ~ 1 month now"   Shingles    Shortness of breath dyspnea    with exertion, improved, asthma improved   Sleep apnea    no cpap used due to pt took off all the time    Spinal headache    Stress incontinence in female    Upper GI bleeding 1970-1980   "had 4"   Past Surgical History:  Procedure Laterality Date   ABDOMINAL HYSTERECTOMY  1987   APPENDECTOMY  1960's   BACK SURGERY  ~ 2004   "chipped disk out to put in permanent stimulator"   BALLOON DILATION N/A 04/23/2020   Procedure: BALLOON DILATION;  Surgeon: Kerin Salen, MD;  Location: [facility];  Service: Gastroenterology;  Laterality: N/A;   BALLOON DILATION N/A 02/11/2021   Procedure: BALLOON DILATION;  Surgeon: Kerin Salen, MD;  Location: [facility];  Service: Gastroenterology;  Laterality: N/A;   BAND HEMORRHOIDECTOMY  1979   BILATERAL OOPHORECTOMY  1988   BIOPSY  06/11/2018   Procedure: BIOPSY;  Surgeon: Kerin Salen, MD;  Location: [facility];  Service: Gastroenterology;;   BIOPSY  12/05/2018   Procedure: BIOPSY;  Surgeon: Kerin Salen, MD;  Location: [facility];  Service: Gastroenterology;;   BIOPSY  04/23/2020   Procedure: BIOPSY;  Surgeon: Kerin Salen, MD;  [facility]: [facility];  Service: Gastroenterology;;   BIOPSY  02/11/2021   Procedure: BIOPSY;  Surgeon: Kerin Salen, MD;  Location: [facility];  Service: Gastroenterology;;   BOTOX INJECTION N/A 11/26/2017   Procedure: BOTOX INJECTION;  Surgeon: Kerin Salen, MD;  Location: [facility] ENDOSCOPY;  Service: Gastroenterology;  Laterality: N/A;   BOTOX INJECTION N/A 06/11/2018   Procedure: BOTOX INJECTION;  Surgeon: Kerin Salen,  MD;  Location: [facility];  Service: Gastroenterology;  Laterality: N/A;   BOTOX INJECTION  12/05/2018   Procedure: BOTOX INJECTION;  Surgeon: Kerin Salen, MD;  Location: [facility];  Service: Gastroenterology;;   BOTOX INJECTION  09/09/2019   Procedure: BOTOX INJECTION;  Surgeon: Vida Rigger, MD;  Location: [facility] ENDOSCOPY;  Service: Endoscopy;;   BOTOX INJECTION N/A 04/23/2020   Procedure: BOTOX INJECTION;  Surgeon: Kerin Salen, MD;  Location: [facility] ENDOSCOPY;  Service: Gastroenterology;  Laterality: N/A;   BOTOX INJECTION N/A 02/11/2021   Procedure: BOTOX INJECTION;  Surgeon: Kerin Salen, MD;  Location: [facility];  Service: Gastroenterology;  Laterality: N/A;   BREAST LUMPECTOMY  07/13/1992   left    CHOLECYSTECTOMY  1980's   COLONOSCOPY     CYSTOSCOPY WITH RETROGRADE PYELOGRAM, URETEROSCOPY AND STENT PLACEMENT Right 09/29/2015   Procedure: CYSTOSCOPY WITH RIGHT RETROGRADE PYELOGRAM, URETEROSCOPY AND STENT PLACEMENT, STONE EXTRACTION WITH BASKET;  Surgeon: Barron Alvine, MD;  Location: [facility];  Service: Urology;  Laterality: Right;   ESOPHAGEAL MANOMETRY N/A 10/03/2017   Procedure: ESOPHAGEAL MANOMETRY (EM);  Surgeon: Kerin Salen, MD;  Location: [facility];  Service: Gastroenterology;  Laterality: N/A;   ESOPHAGOGASTRODUODENOSCOPY N/A 04/17/2014   Procedure: ESOPHAGOGASTRODUODENOSCOPY (EGD);  Surgeon: Barrie Folk, MD;  Location: [facility] ENDOSCOPY;  Service: Endoscopy;  Laterality: N/A;   ESOPHAGOGASTRODUODENOSCOPY N/A 11/26/2017   Procedure: ESOPHAGOGASTRODUODENOSCOPY (EGD);  Surgeon: Kerin Salen, MD;  Location: [facility] ENDOSCOPY;  Service: Gastroenterology;  Laterality: N/A;   ESOPHAGOGASTRODUODENOSCOPY N/A 06/11/2018   Procedure: ESOPHAGOGASTRODUODENOSCOPY (EGD);  Surgeon: Kerin Salen, MD;  Location: [facility] ENDOSCOPY;  Service: Gastroenterology;  Laterality: N/A;   ESOPHAGOGASTRODUODENOSCOPY N/A 12/05/2018   Procedure: ESOPHAGOGASTRODUODENOSCOPY (EGD);  Surgeon: Kerin Salen, MD;  Location: [facility] ENDOSCOPY;   Service: Gastroenterology;  Laterality: N/A;  w/ botox injection   ESOPHAGOGASTRODUODENOSCOPY (EGD) WITH PROPOFOL N/A 09/09/2019   Procedure: ESOPHAGOGASTRODUODENOSCOPY (EGD) WITH PROPOFOL;  Surgeon: Vida Rigger, MD;  Location: [facility] ENDOSCOPY;  Service: Endoscopy;  Laterality: N/A;  With probable Botox and doubtful dilation   ESOPHAGOGASTRODUODENOSCOPY (EGD) WITH PROPOFOL N/A 04/23/2020   Procedure: ESOPHAGOGASTRODUODENOSCOPY (EGD) WITH PROPOFOL;  Surgeon: Kerin Salen, MD;  Location: [facility];  Service: Gastroenterology;  Laterality: N/A;   ESOPHAGOGASTRODUODENOSCOPY (EGD) WITH PROPOFOL N/A 02/11/2021   Procedure: ESOPHAGOGASTRODUODENOSCOPY (EGD) WITH PROPOFOL;  Surgeon: Kerin Salen, MD;  Location: [facility];  Service: Gastroenterology;  Laterality: N/A;   EXTRACORPOREAL SHOCK WAVE LITHOTRIPSY  2016   fracture Left 03/2018   left foot fracture   HOLMIUM LASER APPLICATION Right 09/29/2015   Procedure: HOLMIUM LASER LITHOTRIPSY ;  Surgeon: Barron Alvine, MD;  Location: [facility];  Service: Urology;  Laterality: Right;   LAPAROSCOPY     MASS EXCISION Right 07/24/2018   Procedure: EXCISION OF RIGHT CHEST WALL MASS ERAS PATHWAY;  Surgeon: [name], MD;  Location: [facility];  Service: General;  Laterality: Right;   MASTECTOMY  2010   left    MASTECTOMY Right 10/15   PORT-A-CATH REMOVAL Right 09/25/2016   Procedure: REMOVAL PORT-A-CATH RIGHT;  Surgeon: Avel Peace, MD;  Location: [facility] OR;  Service: General;  Laterality: Right;   PORTACATH PLACEMENT  10/30/2012   Procedure: INSERTION PORT-A-CATH;  Surgeon: Adolph Pollack, MD;  Location: [facility];  Service: General;  Laterality: Right;  Right Ultrasound guided port-a-cath insertion   SALIVARY GLAND SURGERY  ~ 2002   "left was 100% blocked twice; right 50%; only had one surgery"   SALIVARY GLAND SURGERY LEFT     SAVORY DILATION N/A 04/17/2014   Procedure: SAVORY DILATION;  Surgeon: Barrie Folk, MD;  Location: [facility];  Service: Endoscopy;   Laterality: N/A;   SPINAL CORD STIMULATOR INSERTION  ~ 2004   SPINAL CORD STIMULATOR INSERTION N/A 04/09/2015   Procedure: Spinal cord stimulator revision and replacement of paddle electrode;  Surgeon: Barnett Abu, MD;  Location: [facility];  Service: Neurosurgery;  Laterality: N/A;  Spinal cord stimulator revision and replacement  of paddle electrode   TONSILLECTOMY AND ADENOIDECTOMY  1964   TOTAL MASTECTOMY Right 09/25/2014   Procedure: RIGHT MASTECTOMY;  Surgeon: Avel Peace, MD;  Location: [facility];  Service: General;  Laterality: Right;   Social History:  reports that she has never smoked. She has never used smokeless tobacco. She reports that she does not drink alcohol and does not use drugs.  Allergies  Allergen Reactions   Alendronate Other (See Comments)    ESOPHAGEAL IRRITATION DANGER OF ESOPHAGEAL PERF.   Gabapentin Anaphylaxis   Iodinated Contrast Media Anaphylaxis and Other (See Comments)    Fever and blood clots, also ANAPHYLACTIC REACTION!-kmr-10/16/05   Iodine-131 Anaphylaxis   Iohexol Anaphylaxis and Other (See Comments)    ANAPHYLACTIC REACTION!-kmr-10/16/05    Procaine Anaphylaxis, Rash and Other (See Comments)    Novocaine caused shaking, rash, chest heaviness, made patient feel sick     Sulfa Antibiotics Swelling and Other (See Comments)    SWELLING REACTION UNSPECIFIED  HALLUCINATIONS    Terbinafine Hcl Anaphylaxis and Other (See Comments)    THROAT CLOSED   Terbinafine Hcl Anaphylaxis and Other (See Comments)    THROAT CLOSED   Amlodipine Swelling and Other (See Comments)    SWELLING REACTION (patient is currently taking with mild fluid retention)   Amoxicillin-Pot Clavulanate Diarrhea and Other (See Comments)    Bad headaches (tolerates PCN, per husband) Has patient had a PCN reaction causing immediate rash, facial/tongue/throat swelling, SOB or lightheadedness with hypotension: no Has patient had a PCN reaction causing severe rash involving mucus  membranes or skin necrosis: no Has patient had a PCN reaction that required hospitalization no Has patient had a PCN reaction occurring within the last 10 years: no If all of the above answers are "NO", then may proceed with Cephalosporin use.   Clarithromycin Anxiety and Other (See Comments)    Caused the patient to be jittery   Clindamycin/Lincomycin Other (See Comments)    Reaction not recalled   Compazine Anxiety and Other (See Comments)    Hyper and shaky   Dilaudid [Hydromorphone Hcl] Nausea And Vomiting and Other (See Comments)    HEADACHES   Guaifenesin Nausea And Vomiting, Swelling and Other (See Comments)    SWELLING REACTION UNSPECIFIED  HYPERACTIVITY ALSO   Guaifenesin Nausea And Vomiting, Swelling, Anxiety and Other (See Comments)    Headaches and hyperactivity (SWELLING REACTION UNSPECIFIED)   Hydromorphone Nausea And Vomiting and Other (See Comments)    HEADACHE >> MIGRAINE    Hydromorphone Hcl Nausea And Vomiting and Other (See Comments)    HEADACHES, also   Imiquimod Other (See Comments)    "Active and sick "   Lisinopril Cough   Methocarbamol Other (See Comments)    "Makes pt feel irritable "    Metoclopramide Other (See Comments)    "Hyper and shaky"    Metoclopramide Hcl Other (See Comments)    Hyper, shaky   Other Hives, Rash and Other (See Comments)    SOME TAPES CAUSE SKIN IRRITATION   Prochlorperazine Anxiety and Other (See Comments)    "Hyper and shaky"   Promethazine Anxiety and Other (See Comments)    DIZZINESS, also   Sucralfate Nausea Only and Other (See Comments)    Distends stomach and hyperactivity    Sulfamethoxazole-Trimethoprim Other (See Comments)    ALTERED MENTAL STATUS HALLUCINATIONS     Trifluoperazine Other (See Comments)    Hallucinations    Vancomycin Swelling and Other (See Comments)    SWELLING REACTION UNSPECIFIED  Zolpidem Nausea And Vomiting and Other (See Comments)    Caused the patient to be shaky, too   Buspar  [Buspirone] Other (See Comments)    Changed mental status in a negative manner   Clonazepam Other (See Comments)    Changed mental status in a negative manner   Metformin Other (See Comments)    Headaches and migraines     Family History  Problem Relation Age of Onset   Lung cancer Paternal Uncle        smoker   Breast cancer Cousin 72       maternal cousin   Throat cancer Cousin        maternal cousin    Prior to Admission medications   Medication Sig Start Date End Date Taking? Authorizing Provider  amLODipine (NORVASC) 2.5 MG tablet Take 2.5 mg by mouth See admin instructions. Take 2.5 mg by mouth in the morning and an additional 2.5 mg if the systolic number is 160 or greater   Yes [provider]  aspirin EC 325 MG tablet Take 325 mg by mouth 2 (two) times daily as needed (for headaches).   Yes [provider]  cyanocobalamin (,VITAMIN B-12,) 1000 MCG/ML injection Inject 1,000 mcg into the muscle every 30 (thirty) days.   Yes [provider]  levothyroxine (SYNTHROID) 75 MCG tablet Take 75 mcg by mouth See admin instructions. Take 75 mcg by mouth 30 minutes before breakfast on "odd-numbered days," (alternating with 50 mcg on "even-numbered days")   Yes [provider]  levothyroxine (SYNTHROID, LEVOTHROID) 50 MCG tablet Take 50 mcg by mouth See admin instructions. Take 50 mcg by mouth 30 minutes before breakfast "on even-numbered days," (alternating with 75 mcg on "odd-numbered days")   Yes [provider]  losartan (COZAAR) 100 MG tablet Take 100 mg by mouth in the morning. 08/03/17  Yes [provider]  morphine (MSIR) 30 MG tablet Take 30 mg by mouth See admin instructions. Take 30 mg by mouth at 12 MIDNIGHT, 8 AM, 2 PM, and 8 PM   Yes [provider]  nystatin powder Apply 1 Application topically 3 (three) times daily as needed (for irritation).   Yes [provider]  ondansetron (ZOFRAN-ODT) 4 MG  disintegrating tablet Take 4-8 mg by mouth every 8 (eight) hours as needed for nausea or vomiting (DISSOLVE ORALLY).   Yes [provider]  pantoprazole (PROTONIX) 40 MG tablet Take 40 mg by mouth daily before breakfast.    Yes [provider]  predniSONE (DELTASONE) 1 MG tablet Take 4 mg by mouth daily with breakfast.   Yes [provider]  senna-docusate (SENOKOT-S) 8.6-50 MG tablet Take 1 tablet 2 (two) times daily by mouth. Patient taking differently: Take 1 tablet by mouth in the morning. 10/18/17  Yes Albertine Grates, MD  valACYclovir (VALTREX) 1000 MG tablet Take 1,000 mg by mouth 3 (three) times daily as needed (as directed for shingles outbreaks).   Yes [provider]  VIACTIV CALCIUM PLUS D 650-12.5-40 MG-MCG CHEW Chew 1-2 tablets by mouth daily.   Yes [provider]  ondansetron (ZOFRAN ODT) 8 MG disintegrating tablet Take 1 tablet (8 mg total) by mouth every 8 (eight) hours as needed for nausea or vomiting. Patient not taking: Reported on 01/24/2024 10/14/21   Cathren Laine, MD  spironolactone (ALDACTONE) 25 MG tablet Take 25 mg by mouth 2 (two) times daily. For swelling   02/19/12  [provider]    Physical Exam: Vitals:  01/24/24 1235 01/24/24 1247 01/24/24 1658 01/24/24 1900  BP: (!) 115/102  130/73 123/69  Pulse: (!) 126  (!) 127 (!) 123  Resp: (!) 26  (!) 30 (!) 25  Temp:   99.5 F (37.5 C)   TempSrc:   Oral   SpO2: 95%   98%  Weight:  52.2 kg     Physical Exam:  General:  ill appearing, well developed, well nourished HEENT: Normocephalic, atraumatic, PERRL Cardiovascular: Normal rate and rhythm. Distal pulses intact. Pulmonary: Tachypnea, crackles in posterior lung fields, poor air movement anteriorly, some squeaks no wheezes Gastrointestinal: Nondistended abdomen, soft, non-tender, hypoactive bowel sounds Musculoskeletal:Normal ROM, no lower ext edema Skin: Skin is warm and dry. Neuro: No focal deficits noted,  AAOx3. PSYCH: Attentive and cooperative  Data Reviewed:  Results for orders placed or performed during the hospital encounter of 01/24/24 (from the past 24 hours)  Blood gas, venous ([facility], [facility], [facility])     Status: None   Collection Time: 01/24/24 12:30 PM  Result Value Ref Range   pH, Ven 7.33 7.25 - 7.43   pCO2, Ven 49 44 - 60 mmHg   pO2, Ven 42 32 - 45 mmHg   Bicarbonate 25.8 20.0 - 28.0 mmol/L   Acid-base deficit 0.7 0.0 - 2.0 mmol/L   O2 Saturation 72.7 %   Patient temperature 37.0   Comprehensive metabolic panel     Status: Abnormal   Collection Time: 01/24/24  1:09 PM  Result Value Ref Range   Sodium 135 135 - 145 mmol/L   Potassium 3.4 (L) 3.5 - 5.1 mmol/L   Chloride 101 98 - 111 mmol/L   CO2 22 22 - 32 mmol/L   Glucose, Bld 94 70 - 99 mg/dL   BUN 22 8 - 23 mg/dL   Creatinine, Ser 1.61 0.44 - 1.00 mg/dL   Calcium 8.1 (L) 8.9 - 10.3 mg/dL   Total Protein 7.0 6.5 - 8.1 g/dL   Albumin 3.4 (L) 3.5 - 5.0 g/dL   AST 43 (H) 15 - 41 U/L   ALT 21 0 - 44 U/L   Alkaline Phosphatase 42 38 - 126 U/L   Total Bilirubin 0.6 0.0 - 1.2 mg/dL   GFR, Estimated >09 >60 mL/min   Anion gap 12 5 - 15  CBC with Differential     Status: Abnormal   Collection Time: 01/24/24  1:09 PM  Result Value Ref Range   WBC 9.8 4.0 - 10.5 K/uL   RBC 4.86 3.87 - 5.11 MIL/uL   Hemoglobin 14.2 12.0 - 15.0 g/dL   HCT 45.4 09.8 - 11.9 %   MCV 88.5 80.0 - 100.0 fL   MCH 29.2 26.0 - 34.0 pg   MCHC 33.0 30.0 - 36.0 g/dL   RDW 14.7 82.9 - 56.2 %   Platelets 233 150 - 400 K/uL   nRBC 0.0 0.0 - 0.2 %   Neutrophils Relative % 92 %   Neutro Abs 9.0 (H) 1.7 - 7.7 K/uL   Lymphocytes Relative 4 %   Lymphs Abs 0.4 (L) 0.7 - 4.0 K/uL   Monocytes Relative 3 %   Monocytes Absolute 0.3 0.1 - 1.0 K/uL   Eosinophils Relative 0 %   Eosinophils Absolute 0.0 0.0 - 0.5 K/uL   Basophils Relative 0 %   Basophils Absolute 0.0 0.0 - 0.1 K/uL   Immature Granulocytes 1 %   Abs Immature Granulocytes 0.05 0.00 - 0.07 K/uL   Protime-INR     Status: None  Collection Time: 01/24/24  1:09 PM  Result Value Ref Range   Prothrombin Time 13.8 11.4 - 15.2 seconds   INR 1.0 0.8 - 1.2  APTT     Status: None   Collection Time: 01/24/24  1:09 PM  Result Value Ref Range   aPTT 24 24 - 36 seconds  Brain natriuretic peptide     Status: Abnormal   Collection Time: 01/24/24  1:09 PM  Result Value Ref Range   B Natriuretic Peptide 122.1 (H) 0.0 - 100.0 pg/mL  Urinalysis, w/ Reflex to Culture (Infection Suspected) -Urine, Clean Catch     Status: None   Collection Time: 01/24/24  1:16 PM  Result Value Ref Range   Specimen Source URINE, CLEAN CATCH    Color, Urine YELLOW YELLOW   APPearance CLEAR CLEAR   Specific Gravity, Urine 1.014 1.005 - 1.030   pH 7.0 5.0 - 8.0   Glucose, UA NEGATIVE NEGATIVE mg/dL   Hgb urine dipstick NEGATIVE NEGATIVE   Bilirubin Urine NEGATIVE NEGATIVE   Ketones, ur NEGATIVE NEGATIVE mg/dL   Protein, ur NEGATIVE NEGATIVE mg/dL   Nitrite NEGATIVE NEGATIVE   Leukocytes,Ua NEGATIVE NEGATIVE   RBC / HPF 0-5 0 - 5 RBC/hpf   WBC, UA 0-5 0 - 5 WBC/hpf   Bacteria, UA NONE SEEN NONE SEEN   Squamous Epithelial / HPF 0-5 0 - 5 /HPF   Mucus PRESENT   Resp panel by RT-PCR (RSV, Flu A&B, Covid) Anterior Nasal Swab     Status: None   Collection Time: 01/24/24  1:40 PM   Specimen: Anterior Nasal Swab  Result Value Ref Range   SARS Coronavirus 2 by RT PCR NEGATIVE NEGATIVE   Influenza A by PCR NEGATIVE NEGATIVE   Influenza B by PCR NEGATIVE NEGATIVE   Resp Syncytial Virus by PCR NEGATIVE NEGATIVE  I-Stat Lactic Acid, ED     Status: Abnormal   Collection Time: 01/24/24  1:45 PM  Result Value Ref Range   Lactic Acid, Venous 2.0 (HH) 0.5 - 1.9 mmol/L  I-stat chem 8, ED     Status: Abnormal   Collection Time: 01/24/24  1:45 PM  Result Value Ref Range   Sodium 138 135 - 145 mmol/L   Potassium 3.6 3.5 - 5.1 mmol/L   Chloride 100 98 - 111 mmol/L   BUN 26 (H) 8 - 23 mg/dL   Creatinine, Ser 1.61 0.44 -  1.00 mg/dL   Glucose, Bld 99 70 - 99 mg/dL   Calcium, Ion 0.96 (L) 1.15 - 1.40 mmol/L   TCO2 26 22 - 32 mmol/L   Hemoglobin 14.6 12.0 - 15.0 g/dL   HCT 04.5 40.9 - 81.1 %  Blood gas, arterial     Status: None   Collection Time: 01/24/24  5:58 PM  Result Value Ref Range   O2 Content 100.0 L/min   pH, Arterial 7.37 7.35 - 7.45   pCO2 arterial 43 32 - 48 mmHg   pO2, Arterial 90 83 - 108 mmHg   Bicarbonate 24.9 20.0 - 28.0 mmol/L   Acid-base deficit 0.6 0.0 - 2.0 mmol/L   O2 Saturation 98.6 %   Patient temperature 37.0    Collection site LEFT RADIAL    Drawn by [id number]    Allens test (pass/fail) PASS PASS     Assessment and Plan: Pneumonia Sepsis Respiratory distress H/o Addison's disease  - Stepdown monitoring - New 100% nonrebreather.  Low threshold for BiPAP if her work of breathing increases again. - Blood cultures  have been drawn.  Continue vancomycin and cefepime.  She is allergic to azithromycin but could consider doxycycline for atypicals. - Her blood pressures have been stable and but low threshold for stress dose steroids.    Advance Care Planning:   Code Status: Full Code the patient wants to be full code and names her husband as her surrogate decision-maker.  Consults: none  Family Communication: husband at bedside  Severity of Illness: The appropriate patient status for this patient is INPATIENT. Inpatient status is judged to be reasonable and necessary in order to provide the required intensity of service to ensure the patient's safety. The patient's presenting symptoms, physical exam findings, and initial radiographic and laboratory data in the context of their chronic comorbidities is felt to place them at high risk for further clinical deterioration. Furthermore, it is not anticipated that the patient will be medically stable for discharge from the hospital within 2 midnights of admission.   * I certify that at the point of admission it is my clinical judgment  that the patient will require inpatient hospital care spanning beyond 2 midnights from the point of admission due to high intensity of service, high risk for further deterioration and high frequency of surveillance required.*  Author: Buena Irish, MD 01/24/2024 7:22 PM  For on call review [email or web address].

## 2024-01-24 NOTE — Progress Notes (Signed)
Pt feeling nauseated unable to start bipap at this time. MD aware and ordering something for nausea. Will check back for bipap placement.

## 2024-01-24 NOTE — Sepsis Progress Note (Signed)
 Code Sepsis protocol being monitored by eLink.

## 2024-01-24 NOTE — ED Provider Notes (Signed)
[place] EMERGENCY DEPARTMENT AT [facility] Provider Note   CSN: [id number] Arrival date & time: 01/24/24  1209     History  No chief complaint on file.   Daisy [name] is a 73 y.o. female.  73 year old female presents today for concern of not feeling well since yesterday with nausea, vomiting, diarrhea, cough and today developing fever.  Reports compliance with her home medications.  Has history of Addison's disease.  States that she is compliant with her steroids.  Denies chest pain, abdominal pain.  The history is provided by the patient. No language interpreter was used.       Home Medications Prior to Admission medications   Medication Sig Start Date End Date Taking? Authorizing Provider  amLODipine (NORVASC) 2.5 MG tablet Take 2.5 mg by mouth in the morning and at bedtime.    [provider]  Calcium-Vitamin D-Vitamin K 500-500-40 MG-UNT-MCG CHEW Chew 3 each by mouth daily.    [provider]  Carboxymethylcellulose Sod PF (LUBRICANT EYE DROPS PF) 0.5 % SOLN Apply 1-2 drops to eye 3 (three) times daily as needed (to lubricate the eyes).    [provider]  cyanocobalamin (,VITAMIN B-12,) 1000 MCG/ML injection Inject 1,000 mcg into the muscle every 30 (thirty) days.    [provider]  levothyroxine (SYNTHROID, LEVOTHROID) 50 MCG tablet Take 50-75 mcg by mouth See admin instructions. Take 50 mcg by mouth in the morning "on even-numbered days" and alternate with 75 mcg on "odd-numbered days"    [provider]  losartan (COZAAR) 100 MG tablet Take 100 mg by mouth every morning. Take 100 mg by mouth in the morning 08/03/17   [provider]  morphine (MSIR) 30 MG tablet Take 30 mg by mouth every 8 (eight) hours.    [provider]  ondansetron (ZOFRAN ODT) 8 MG disintegrating tablet Take 1 tablet (8 mg total) by mouth every 8 (eight) hours as needed for nausea or vomiting. 10/14/21   Cathren Laine, MD   ondansetron (ZOFRAN-ODT) 4 MG disintegrating tablet Take 4 mg by mouth every 8 (eight) hours as needed for nausea or vomiting.    [provider]  pantoprazole (PROTONIX) 40 MG tablet Take 40 mg by mouth daily before breakfast.     [provider]  predniSONE (DELTASONE) 1 MG tablet Take 4 mg by mouth daily with breakfast.    [provider]  senna-docusate (SENOKOT-S) 8.6-50 MG tablet Take 1 tablet 2 (two) times daily by mouth. Patient taking differently: Take 1 tablet by mouth in the morning, at noon, and at bedtime. 10/18/17   Albertine Grates, MD  valACYclovir (VALTREX) 1000 MG tablet Take 1,000 mg by mouth 3 (three) times daily as needed (for shingles outbreaks).     [provider]  spironolactone (ALDACTONE) 25 MG tablet Take 25 mg by mouth 2 (two) times daily. For swelling   02/19/12  [provider]      Allergies    Alendronate, Gabapentin, Iodinated contrast media, Iodine-131, Iohexol, Procaine, Sulfa antibiotics, Terbinafine hcl, Terbinafine hcl, Amlodipine, Amoxicillin-pot clavulanate, Clarithromycin, Clindamycin/lincomycin, Compazine, Dilaudid [hydromorphone hcl], Guaifenesin, Guaifenesin, Hydromorphone, Hydromorphone hcl, Imiquimod, Lisinopril, Methocarbamol, Metoclopramide, Metoclopramide hcl, Other, Prochlorperazine, Promethazine, Sucralfate, Sulfamethoxazole-trimethoprim, Trifluoperazine, Vancomycin, Zolpidem, and Metformin    Review of Systems   Review of Systems  Constitutional:  Negative for chills and fever.  Respiratory:  Positive for cough and shortness of breath.   Cardiovascular:  Negative for chest pain, palpitations and leg swelling.  Gastrointestinal:  Positive for diarrhea, nausea and vomiting. Negative for abdominal pain.  Neurological:  Negative for light-headedness.  All other systems reviewed and are negative.   Physical Exam Updated Vital Signs BP (!) 115/102   Pulse (!) 126   Temp (!) 102.5 F (39.2 C) (Rectal)    Resp (!) 26   Wt 52.2 kg   SpO2 95%   BMI 20.70 kg/m  Physical Exam Vitals and nursing note reviewed.  Constitutional:      General: She is not in acute distress (Appears uncomfortable, but no acute distress).    Appearance: Normal appearance. She is well-developed. She is not ill-appearing.  HENT:     Head: Normocephalic and atraumatic.     Nose: Nose normal.  Eyes:     General: No scleral icterus.    Extraocular Movements: Extraocular movements intact.     Conjunctiva/sclera: Conjunctivae normal.  Cardiovascular:     Rate and Rhythm: Regular rhythm. Tachycardia present.  Pulmonary:     Effort: Pulmonary effort is normal. No respiratory distress.     Breath sounds: Normal breath sounds. No wheezing.     Comments: Course lung sounds throughout Abdominal:     General: There is no distension.     Palpations: Abdomen is soft.     Tenderness: There is no abdominal tenderness.  Musculoskeletal:        General: No swelling. Normal range of motion.     Cervical back: Normal range of motion and neck supple.  Skin:    General: Skin is warm and dry.     Capillary Refill: Capillary refill takes less than 2 seconds.  Neurological:     General: No focal deficit present.     Mental Status: She is alert. Mental status is at baseline.  Psychiatric:        Mood and Affect: Mood normal.     ED Results / Procedures / Treatments   Labs (all labs ordered are listed, but only abnormal results are displayed) Labs Reviewed  CBC WITH DIFFERENTIAL/PLATELET - Abnormal; Notable for the following components:      Result Value   Neutro Abs 9.0 (*)    Lymphs Abs 0.4 (*)    All other components within normal limits  RESP PANEL BY RT-PCR (RSV, FLU A&B, COVID)  RVPGX2  CULTURE, BLOOD (ROUTINE X 2)  CULTURE, BLOOD (ROUTINE X 2)  COMPREHENSIVE METABOLIC PANEL  PROTIME-INR  APTT  BRAIN NATRIURETIC PEPTIDE  BLOOD GAS, VENOUS  URINALYSIS, W/ REFLEX TO CULTURE (INFECTION SUSPECTED)  I-STAT CG4  LACTIC ACID, ED  I-STAT CHEM 8, ED    EKG EKG Interpretation Date/Time:  Thursday January 24 2024 12:35:44 EST Ventricular Rate:  128 PR Interval:  129 QRS Duration:  80 QT Interval:  285 QTC Calculation: 415 R Axis:   23  Text Interpretation: Sinus tachycardia Probable left atrial enlargement Probable left ventricular hypertrophy ST depr, consider ischemia, inferior leads ST elevation, consider anterior injury Confirmed by Lorre Nick ([id number]) on 01/24/2024 1:07:08 PM  Radiology DG Chest Port 1 View Result Date: 01/24/2024 CLINICAL DATA:  Sepsis. EXAM: PORTABLE CHEST 1 VIEW COMPARISON:  February 13, 2023. FINDINGS: The heart size and mediastinal contours are within normal limits. Both lungs are clear. Scoliosis of thoracic spine is noted. IMPRESSION: No active disease. Electronically Signed   By: Lupita Raider M.D.   On: 01/24/2024 13:16    Procedures .Critical Care  Performed by: Marita Kansas, PA-C Authorized by: Marita Kansas, PA-C   Critical care provider  statement:    Critical care time (minutes):  45   Critical care was necessary to treat or prevent imminent or life-threatening deterioration of the following conditions:  Respiratory failure and sepsis   Critical care was time spent personally by me on the following activities:  Development of treatment plan with patient or surrogate, discussions with consultants, evaluation of patient's response to treatment, examination of patient, ordering and review of laboratory studies, ordering and review of radiographic studies, ordering and performing treatments and interventions, pulse oximetry, re-evaluation of patient's condition and review of old charts   Care discussed with: admitting provider       Medications Ordered in ED Medications  lactated ringers infusion (has no administration in time range)  metroNIDAZOLE (FLAGYL) IVPB 500 mg (has no administration in time range)  vancomycin (VANCOCIN) IVPB 1000 mg/200 mL premix (has no  administration in time range)  lactated ringers infusion (has no administration in time range)  lactated ringers bolus 1,000 mL (has no administration in time range)    And  lactated ringers bolus 500 mL (has no administration in time range)    And  lactated ringers bolus 250 mL (has no administration in time range)  ceFEPIme (MAXIPIME) 2 g in sodium chloride 0.9 % 100 mL IVPB (2 g Intravenous New Bag/Given 01/24/24 1307)    ED Course/ Medical Decision Making/ A&P                                 Medical Decision Making Amount and/or Complexity of Data Reviewed Labs: ordered. Radiology: ordered.  Risk Prescription drug management. Decision regarding hospitalization.   Medical Decision Making / ED Course   This patient presents to the ED for concern of sepsis, this involves an extensive number of treatment options, and is a complaint that carries with it a high risk of complications and morbidity.  The differential diagnosis includes pneumonia, intra-abdominal infection, flu, other viral URI  MDM: 73 year old female presents with nausea, vomiting, diarrhea since yesterday.  Today she became febrile.  O2 sats on scene with EMS were 85%.  Recovered with supplemental O2 on nasal cannula.  She is febrile here and SIRS positive.  Chest x-ray currently pending but on my interpretation appears to have left lower lobe pneumonia.  Code sepsis initiated.  Admission considered but will reevaluate after labs and imaging.  CBC without leukocytosis and without anemia.  CMP shows potassium of 3.4 otherwise without acute concerns.  BMP 122.  Previous echo shows preserved EF.  UA without evidence of UTI.  Respiratory panel negative.  Lactic acidosis 2.0.  Will continue to trend.  chest x-ray without acute cardiopulmonary process.  CT without contrast obtained due to contrast allergy.  Shows concern for pneumonia.  Will start on antibiotics and discussed with hospitalist for admission.   Lab  Tests: -I ordered, reviewed, and interpreted labs.   The pertinent results include:   Labs Reviewed  CBC WITH DIFFERENTIAL/PLATELET - Abnormal; Notable for the following components:      Result Value   Neutro Abs 9.0 (*)    Lymphs Abs 0.4 (*)    All other components within normal limits  RESP PANEL BY RT-PCR (RSV, FLU A&B, COVID)  RVPGX2  CULTURE, BLOOD (ROUTINE X 2)  CULTURE, BLOOD (ROUTINE X 2)  PROTIME-INR  APTT  URINALYSIS, W/ REFLEX TO CULTURE (INFECTION SUSPECTED)  COMPREHENSIVE METABOLIC PANEL  BRAIN NATRIURETIC PEPTIDE  BLOOD GAS, VENOUS  I-STAT CG4 LACTIC ACID, ED  I-STAT CHEM 8, ED      EKG  EKG Interpretation Date/Time:  Thursday January 24 2024 12:35:44 EST Ventricular Rate:  128 PR Interval:  129 QRS Duration:  80 QT Interval:  285 QTC Calculation: 415 R Axis:   23  Text Interpretation: Sinus tachycardia Probable left atrial enlargement Probable left ventricular hypertrophy ST depr, consider ischemia, inferior leads ST elevation, consider anterior injury Confirmed by Lorre Nick ([id number]) on 01/24/2024 1:07:08 PM         Imaging Studies ordered: I ordered imaging studies including cxr, ct chest wo contrast I independently visualized and interpreted imaging. I agree with the radiologist interpretation   Medicines ordered and prescription drug management: Meds ordered this encounter  Medications   lactated ringers infusion   ceFEPIme (MAXIPIME) 2 g in sodium chloride 0.9 % 100 mL IVPB    Antibiotic Indication::   Other Indication (list below)    Other Indication::   Unknown Source.   metroNIDAZOLE (FLAGYL) IVPB 500 mg    Antibiotic Indication::   Other Indication (list below)    Other Indication::   Unknown Source.   vancomycin (VANCOCIN) IVPB 1000 mg/200 mL premix    Indication::   Other Indication (list below)    Other Indication::   Unknown Source.   lactated ringers infusion   AND Linked Order Group    lactated ringers bolus 1,000 mL      Total Body Weight basis for 30 mL/kg  bolus delivery:   52.2 kg    lactated ringers bolus 500 mL     Total Body Weight basis for 30 mL/kg  bolus delivery:   52.2 kg    lactated ringers bolus 250 mL     Total Body Weight basis for 30 mL/kg  bolus delivery:   52.2 kg   ondansetron (ZOFRAN) injection 4 mg    -I have reviewed the patients home medicines and have made adjustments as needed  Critical interventions Code sepsis  Cardiac Monitoring: The patient was maintained on a cardiac monitor.  I personally viewed and interpreted the cardiac monitored which showed an underlying rhythm of: sinus tach  Reevaluation: After the interventions noted above, I reevaluated the patient and found that they have :stayed the same  Co morbidities that complicate the patient evaluation  Past Medical History:  Diagnosis Date   Addison's disease (HCC)    Adenocarcinoma of breast (HCC) 07/13/1992   LEFT LUMPECTOMY AND CHEMO AND RADIATION;   NOW HAS RIGHT BREAST MASS 09/25/14 - PLANS MASTECTOMY    Adrenal insufficiency (HCC)    "w/secondary Addison's"   Anemia    Arthritis    Asthma    using incentive spirometer alot now   Brittle bone disease    Bruises easily    Chronic bronchitis    Chronic pain    DR. MARK PHILLIPS WITH PAIN CLINIC -  CHRONIC BACK PAIN AND SCOLIOSIS AND PREVIOUS BACK SURGERY AND NOW LUMBAR FRACTURE - PT HAS [employer] SPINAL STIMULATOR WHICH SHE CAN TURN ON AND OFF ; PT ON MORPINE AND CLONAZEPAM AND ROBAXIN   COPD (chronic obstructive pulmonary disease) (HCC)    DDD (degenerative disc disease), lumbar    Eye pain, right    last 4 months can be both eyes   Falls frequently    Family history of adverse reaction to anesthesia    ponv   Fibromyalgia    Fracture    LUMBAR 1 - FROM A FALL JUNE 2015 -  PT HAS A LOT OF SEVERE BACK PAIN AND DIFFICULTY WALKING   GERD (gastroesophageal reflux disease)    H/O esophageal spasm    "I can have as many as 74 TUMS before they stop"   H/O  hiatal hernia    Head injury with loss of consciousness (HCC)    Headache(784.0)    "when I don't eat" (02/19/12)   Hepatitis B infection 1971   "in high school; after drinking after football player"   History of kidney stones    1 large stone now   History of urinary urgency    AZO HELPS - DOES TURN URINE ORANGE; DR. Isabel Caprice IS PT'S UROLOGIST   Hypertension    Hypothyroidism    Mass    right chest wall   Migraines    "much better now" (02/19/12)   Multiple sclerosis (HCC) 05/1988   Relasping and Remitting"   OSA (obstructive sleep apnea)    Paget's disease of breast (HCC) 2010   left   Pneumonia    PONV (postoperative nausea and vomiting)    one episode 1993 vomiting after surgery   Poor venous access    PT STATES HER PORT A CATH NOT TO BE USED FOR SURGERY/ IV'S   Positive TB test 2017   "ran deep in my father's family; went on INH for awhile; it caused migraines"   Radiation adverse effect    "100% blockage on left; 50% on right salivary; can drain w/massage"   Restless leg syndrome, controlled 02/19/12   "much better since being on CPAP; been on that ~ 1 month now"   Shingles    Shortness of breath dyspnea    with exertion, improved, asthma improved   Sleep apnea    no cpap used due to pt took off all the time    Spinal headache    Stress incontinence in female    Upper GI bleeding 1970-1980   "had 4"      Dispostion: Discussed with hospitalist.  They will evaluate patient for admission.   Final Clinical Impression(s) / ED Diagnoses Final diagnoses:  Sepsis, due to unspecified organism, unspecified whether acute organ dysfunction present [facility])  Community acquired pneumonia, unspecified laterality    Rx / DC Orders ED Discharge Orders     None         Marita Kansas, PA-C 01/24/24 1802    Lorre Nick, MD 01/25/24 1820

## 2024-01-24 NOTE — ED Triage Notes (Signed)
PT BIBA from home for N/V/D since yesterday pt was at 82% on room air, placed on 6L by EMS with rebound to 94%  EMS administered 650mg  of tylenol.  Pt family states she developed a fever today of 103.

## 2024-01-24 NOTE — ED Notes (Signed)
Per RT hold BIPAP unless respirations are over 30

## 2024-01-25 ENCOUNTER — Other Ambulatory Visit: Payer: Self-pay

## 2024-01-25 DIAGNOSIS — J9621 Acute and chronic respiratory failure with hypoxia: Secondary | ICD-10-CM | POA: Diagnosis not present

## 2024-01-25 DIAGNOSIS — G35 Multiple sclerosis: Secondary | ICD-10-CM | POA: Diagnosis not present

## 2024-01-25 LAB — CBC
HCT: 39.7 % (ref 36.0–46.0)
Hemoglobin: 12.5 g/dL (ref 12.0–15.0)
MCH: 28.4 pg (ref 26.0–34.0)
MCHC: 31.5 g/dL (ref 30.0–36.0)
MCV: 90.2 fL (ref 80.0–100.0)
Platelets: 155 10*3/uL (ref 150–400)
RBC: 4.4 MIL/uL (ref 3.87–5.11)
RDW: 14.7 % (ref 11.5–15.5)
WBC: 7 10*3/uL (ref 4.0–10.5)
nRBC: 0 % (ref 0.0–0.2)

## 2024-01-25 LAB — BASIC METABOLIC PANEL
Anion gap: 15 (ref 5–15)
BUN: 23 mg/dL (ref 8–23)
CO2: 22 mmol/L (ref 22–32)
Calcium: 7.3 mg/dL — ABNORMAL LOW (ref 8.9–10.3)
Chloride: 98 mmol/L (ref 98–111)
Creatinine, Ser: 0.54 mg/dL (ref 0.44–1.00)
GFR, Estimated: >60 mL/min (ref >60)
Glucose, Bld: 60 mg/dL — ABNORMAL LOW (ref 70–99)
Potassium: 3.7 mmol/L (ref 3.5–5.1)
Sodium: 135 mmol/L (ref 135–145)

## 2024-01-25 LAB — MAGNESIUM: Magnesium: 1.4 mg/dL — ABNORMAL LOW (ref 1.7–2.4)

## 2024-01-25 LAB — MRSA NEXT GEN BY PCR, NASAL: MRSA by PCR Next Gen: NOT DETECTED

## 2024-01-25 LAB — TSH: TSH: 0.262 u[IU]/mL — ABNORMAL LOW (ref 0.350–4.500)

## 2024-01-25 MED ORDER — MAGNESIUM SULFATE 2 GM/50ML IV SOLN
2.0000 g | Freq: Once | INTRAVENOUS | Status: AC
  Administered 2024-01-25: 2 g via INTRAVENOUS
  Filled 2024-01-25: qty 50

## 2024-01-25 MED ORDER — MORPHINE SULFATE (PF) 2 MG/ML IV SOLN
2.0000 mg | Freq: Once | INTRAVENOUS | Status: AC
  Administered 2024-01-25: 2 mg via INTRAVENOUS
  Filled 2024-01-25: qty 1

## 2024-01-25 MED ORDER — SODIUM CHLORIDE 0.9 % IV SOLN
500.0000 mg | INTRAVENOUS | Status: DC
  Administered 2024-01-25 – 2024-01-27 (×2): 500 mg via INTRAVENOUS
  Filled 2024-01-25 (×4): qty 5

## 2024-01-25 NOTE — Progress Notes (Signed)
Pt performed NIF -45 times 3 attempts with great pt effort.

## 2024-01-25 NOTE — Progress Notes (Signed)
PROGRESS NOTE  Daisy [name]    DOB: 03-Oct-1951, 73 y.o.  GEX:[id number]    Code Status: Full Code   DOA: 01/24/2024   LOS: 1   Brief hospital course  Daisy [name] is a 73 y.o. female with medical history significant for breast cancer x 3 but cancer free now for 9 years, Addison's disease on daily prednisone, obstructive sleep apnea but not on CPAP, and multiple sclerosis.  Presented for dizziness. CT chest: diffuse changes consistent with bronchiolitis.  HR 140s as well as an elevated respiratory rate and elevated lactic acid,  the patient did meet sepsis criteria. Was on 100% nonrebreather and her O2 sats were 96 to 97%.  She had already received sepsis dose fluids and IV vancomycin and cefepime.  COVID, influenza, and RSV negative.   Patient was admitted to medicine service for further workup and management of respiratory distress as outlined in detail below.  01/25/24 -patient talking on phone with NRB off and has normal O2 sats until   Assessment & Plan  Principal Problem:   Sepsis (HCC) Active Problems:   Addison's disease (HCC)   Hypothyroidism   Spinal cord stimulator dysfunction (HCC)  Sepsis- criteria have resolved Acute Respiratory distress with hypoxia- infectious vs inflammatory bronchiolitis on CT scan. Left lung consolidations. Weaned to Daisy. WBC normal. Negative MRSA swab - continue O2 and wean as tolerated - RT for FEV monitoring - continue IV abx. Dc vanc. - breathing treatments - IS, flutter valve  MS  Addison's disease- stable at baseline - continue chronic pain meds as scheduled at home - continue prednisone - PT/OT  Hypothyroidism- TSH 0.262 - continue home med. No change in acute setting  Mild hypomagnesemia- Mg++ 1.4 - replace  HTN- well controlled   Body mass index is 22.46 kg/m.  VTE ppx: enoxaparin (LOVENOX) injection 40 mg Start: 01/24/24 2200  Diet:     Diet   Diet regular Room service appropriate? Yes; Fluid consistency: Thin    Consultants: None   Subjective 01/25/24    Pt reports feeling well. Denies SOB at rest but does have dyspnea with talking.    Objective   Vitals:   01/25/24 0145 01/25/24 0200 01/25/24 0215 01/25/24 0400  BP:  125/62    Pulse: (!) 103 100 96   Resp: 18 19 18    Temp:    97.9 F (36.6 C)  TempSrc:    Oral  SpO2: 100% 100% 100%   Weight:      Height:        Intake/Output Summary (Last 24 hours) at 01/25/2024 0713 Last data filed at 01/25/2024 0521 Gross per 24 hour  Intake 1500.92 ml  Output 225 ml  Net 1275.92 ml   Filed Weights   01/24/24 1247  Weight: 52.2 kg    Physical Exam:  General: awake, alert, NAD HEENT: atraumatic, clear conjunctiva, anicteric sclera, MMM, hearing grossly normal Respiratory: normal respiratory effort. CTAB Cardiovascular: quick capillary refill, normal S1/S2, RRR, no JVD, murmurs Gastrointestinal: soft, NT, ND Nervous: A&O x3. no gross focal neurologic deficits, normal speech Extremities: moves all equally, no edema, normal tone Skin: dry, intact, normal temperature, normal color. No rashes, lesions or ulcers on exposed skin Psychiatry: normal mood, congruent affect  Labs   I have personally reviewed the following labs and imaging studies CBC    Component Value Date/Time   WBC 7.0 01/25/2024 0337   RBC 4.40 01/25/2024 0337   HGB 12.5 01/25/2024 0337   HGB 13.0 08/03/2016  0811   HCT 39.7 01/25/2024 0337   HCT 41.3 08/03/2016 0811   PLT 155 01/25/2024 0337   PLT 216 08/03/2016 0811   MCV 90.2 01/25/2024 0337   MCV 80.4 08/03/2016 0811   MCH 28.4 01/25/2024 0337   MCHC 31.5 01/25/2024 0337   RDW 14.7 01/25/2024 0337   RDW 14.5 08/03/2016 0811   LYMPHSABS 0.4 (L) 01/24/2024 1309   LYMPHSABS 3.5 (H) 08/03/2016 0811   MONOABS 0.3 01/24/2024 1309   MONOABS 0.7 08/03/2016 0811   EOSABS 0.0 01/24/2024 1309   EOSABS 0.2 08/03/2016 0811   BASOSABS 0.0 01/24/2024 1309   BASOSABS 0.1 08/03/2016 0811      Latest Ref Rng & Units  01/25/2024    3:37 AM 01/24/2024    1:45 PM 01/24/2024    1:09 PM  BMP  Glucose 70 - 99 mg/dL 60  99  94   BUN 8 - 23 mg/dL 23  26  22    Creatinine 0.44 - 1.00 mg/dL 7.82  9.56  2.13   Sodium 135 - 145 mmol/L 135  138  135   Potassium 3.5 - 5.1 mmol/L 3.7  3.6  3.4   Chloride 98 - 111 mmol/L 98  100  101   CO2 22 - 32 mmol/L 22   22   Calcium 8.9 - 10.3 mg/dL 7.3   8.1     DG Chest Portable 1 View Result Date: 01/24/2024 CLINICAL DATA:  Respiratory distress. EXAM: PORTABLE CHEST 1 VIEW COMPARISON:  Radiograph and CT earlier today FINDINGS: Normal heart size with stable mediastinal contours. Majority of the tree-in-bud opacities on CT have no radiographic correlate. No progressive airspace disease, a developing pulmonary edema, pleural effusion or pneumothorax. Spinal stimulator in place. Scoliosis. IMPRESSION: Majority of the tree-in-bud opacities on CT have no radiographic correlate. No progressive airspace disease. Electronically Signed   By: Narda Rutherford M.D.   On: 01/24/2024 18:30   CT Chest Wo Contrast Result Date: 01/24/2024 CLINICAL DATA:  Nausea vomiting diarrhea low oxygen saturation EXAM: CT CHEST WITHOUT CONTRAST TECHNIQUE: Multidetector CT imaging of the chest was performed following the standard protocol without IV contrast. RADIATION DOSE REDUCTION: This exam was performed according to the departmental dose-optimization program which includes automated exposure control, adjustment of the mA and/or kV according to patient size and/or use of iterative reconstruction technique. COMPARISON:  Chest x-ray 01/24/2024, CT chest 09/06/2019, 03/22/2015 FINDINGS: Cardiovascular: Limited evaluation without intravenous contrast. Nonaneurysmal aorta. Mild atherosclerosis. Normal cardiac size. No pericardial effusion Mediastinum/Nodes: Patent trachea. No thyroid mass. No suspicious lymph nodes. Mild air distension of the esophagus. Mild circumferential distal esophageal thickening and mild  thickening at the GE junction. Small hiatal hernia. Lungs/Pleura: Subpleural reticulation in the left upper lobe with mild lingular bronchiectasis and consolidation likely due to scarring and post radiation change. Scattered bilateral central lobular ground-glass density and tree-in-bud density, most notable in the left lower lobe. No pleural effusion or pneumothorax Upper Abdomen: No acute finding. Small kidney stones. Cholecystectomy Musculoskeletal: Scoliosis and degenerative changes of the spine. Chronic compression fracture at L1. Ascending thoracic stimulator leads. Status post left mastectomy. IMPRESSION: 1. Scattered bilateral central lobular ground-glass density and tree-in-bud density, most notable in the left lower lobe, findings are most consistent with infectious or inflammatory bronchiolitis. 2. Subpleural reticulation in the left upper lobe with mild lingular bronchiectasis and consolidation likely due to scarring and post radiation change. 3. Small hiatal hernia with mild circumferential distal esophageal thickening and mild thickening at the GE junction,  findings could be due to esophagitis or reflux, consider correlation with endoscopy. 4. Aortic atherosclerosis. Aortic Atherosclerosis (ICD10-I70.0). Electronically Signed   By: Jasmine Pang M.D.   On: 01/24/2024 15:16   DG Chest Port 1 View Result Date: 01/24/2024 CLINICAL DATA:  Sepsis. EXAM: PORTABLE CHEST 1 VIEW COMPARISON:  February 13, 2023. FINDINGS: The heart size and mediastinal contours are within normal limits. Both lungs are clear. Scoliosis of thoracic spine is noted. IMPRESSION: No active disease. Electronically Signed   By: Lupita Raider M.D.   On: 01/24/2024 13:16    Disposition Plan & Communication  Patient status: Inpatient  Admitted From: Home Planned disposition location: Home Anticipated discharge date: 2/22 pending oxygen weaned  Family Communication: husband on phone during encounter    Author: Leeroy Bock, DO [facility] 01/25/2024, 7:13 AM   Available by Epic secure chat 7AM-7PM. If 7PM-7AM, please contact night-coverage.  [facility] contact information found on [email or web address].

## 2024-01-25 NOTE — TOC Initial Note (Signed)
Transition of Care [facility]) - Initial/Assessment Note    Patient Details  Name: Daisy [name] MRN: [id number] Date of Birth: [birth year]  Transition of Care [facility]) CM/SW Contact:    Adrian Prows, RN Phone Number: 01/25/2024, 11:53 AM  Clinical Narrative:                 Spoke w/ pt, spouse [name] ([phone]), and dtr in room;pt sayys she lives at home and plans to return at d/c; she identified her spouse as POC; pt verified PCP/insurance; she denies SDOH risks; pt says she does not have DME, HH services, or home oxygen; TOC will follow.  Expected Discharge Plan: Home/Self Care Barriers to Discharge: Continued Medical Work up   Patient Goals and CMS Choice Patient states their goals for this hospitalization and ongoing recovery are:: home CMS Medicare.gov Compare Post Acute Care list provided to:: Patient        Expected Discharge Plan and Services   Discharge Planning Services: CM Consult   Living arrangements for the past 2 months: Single Family Home                                      Prior Living Arrangements/Services Living arrangements for the past 2 months: Single Family Home Lives with:: Spouse Patient language and need for interpreter reviewed:: Yes Do you feel safe going back to the place where you live?: Yes      Need for Family Participation in Patient Care: Yes (Comment) Care giver support system in place?: Yes (comment) Current home services:  (n/a) Criminal Activity/Legal Involvement Pertinent to Current Situation/Hospitalization: No - Comment as needed  Activities of Daily Living   ADL Screening (condition at time of admission) Independently performs ADLs?: Yes (appropriate for developmental age) Is the patient deaf or have difficulty hearing?: No Does the patient have difficulty seeing, even when wearing glasses/contacts?: No Does the patient have difficulty concentrating, remembering, or making decisions?: No  Permission  Sought/Granted Permission sought to share information with : Case Manager Permission granted to share information with : Yes, Verbal Permission Granted  Share Information with NAME: Case Manager     Permission granted to share info w Relationship: Macyn Shropshire (spouse) [phone]     Emotional Assessment Appearance:: Appears stated age Attitude/Demeanor/Rapport: Gracious Affect (typically observed): Accepting Orientation: : Oriented to Self, Oriented to Place, Oriented to  Time, Oriented to Situation Alcohol / Substance Use: Not Applicable Psych Involvement: No (comment)  Admission diagnosis:  Sepsis (HCC) [A41.9] Community acquired pneumonia, unspecified laterality [J18.9] Sepsis, due to unspecified organism, unspecified whether acute organ dysfunction present [facility]) [A41.9] Patient Active Problem List   Diagnosis Date Noted   Esophageal dysmotility    Pericarditis 09/06/2019   Sepsis (HCC) 09/06/2019   Chest pain 09/06/2019   Back pain 10/12/2017   Degenerative disc disease, lumbar 10/12/2017   Compression fracture of first lumbar vertebra (HCC) 10/12/2017   GERD (gastroesophageal reflux disease) 10/12/2017   Acute encephalopathy 04/19/2016   Hypokalemia 04/19/2016   Confusion 04/18/2016   Nephrolithiasis 09/29/2015   Port-A-Cath in place 09/07/2015   Herpes zoster 09/07/2015   Spinal cord stimulator dysfunction (HCC) 04/09/2015   History of left breast cancer 09/25/2014   Pyelonephritis 05/17/2014   Acute pyelonephritis 05/17/2014   Abdominal pain, left lower quadrant 05/17/2014   Nausea & vomiting 10/13/2012   Iron deficiency anemia 06/18/2012   DCIS (ductal carcinoma in situ) of  breast 03/22/2012   Abdominal pain 02/19/2012   Addison's disease (HCC)    Hypertension    Hypothyroidism    Shingles    OBESITY 05/20/2008   PCP:  Daisy Floro, MD Pharmacy:   [facility] [phone] - [place], [place] - [address] [place] [address] Phone:  [phone] Fax: [phone]     Social Drivers of Health (SDOH) Social History: SDOH Screenings   Food Insecurity: No Food Insecurity (01/25/2024)  Housing: Low Risk  (01/25/2024)  Transportation Needs: No Transportation Needs (01/25/2024)  Utilities: Not At Risk (01/25/2024)  Social Connections: Unknown (01/25/2024)  Tobacco Use: Low Risk  (01/24/2024)   SDOH Interventions: Food Insecurity Interventions: Intervention Not Indicated, Inpatient TOC Housing Interventions: Intervention Not Indicated, Inpatient TOC Transportation Interventions: Intervention Not Indicated, Inpatient TOC Utilities Interventions: Intervention Not Indicated, Inpatient TOC   Readmission Risk Interventions    01/25/2024   11:51 AM  Readmission Risk Prevention Plan  Transportation Screening Complete  PCP or Specialist Appt within 5-7 Days Complete  Home Care Screening Complete  Medication Review (RN CM) Complete

## 2024-01-26 DIAGNOSIS — A419 Sepsis, unspecified organism: Secondary | ICD-10-CM | POA: Diagnosis not present

## 2024-01-26 MED ORDER — MAGNESIUM SULFATE 2 GM/50ML IV SOLN
2.0000 g | Freq: Once | INTRAVENOUS | Status: AC
  Administered 2024-01-27: 2 g via INTRAVENOUS
  Filled 2024-01-26: qty 50

## 2024-01-26 NOTE — Progress Notes (Addendum)
 Progress Note    Daisy [name]  UVO:[id number] DOB: 04/27/1951  DOA: 01/24/2024 PCP: Daisy Floro, MD      Brief Narrative:    Medical records reviewed and are as summarized below:  Daisy [name] is a 73 y.o. female with medical history significant for breast cancer x 3 but cancer free now for 9 years, Addison's disease on daily prednisone, obstructive sleep apnea but not on CPAP, and multiple sclerosis.  She presented to the hospital with fever (temperature 103 F), nausea, vomiting and diarrhea.  She was hypoxic, tachycardic and tachypneic in the ED.  She initially required 100% oxygen via nonrebreather mask.  CT chest: diffuse changes consistent with bronchiolitis.  COVID, influenza, and RSV negative.    She was given IV fluids and empiric IV antibiotics for sepsis.       Assessment/Plan:   Principal Problem:   Sepsis (HCC) Active Problems:   Addison's disease (HCC)   Hypothyroidism   Spinal cord stimulator dysfunction (HCC)    Body mass index is 22.46 kg/m.   Sepsis, acute bronchiolitis: Continue empiric IV antibiotics (azithromycin and cefepime)   Acute hypoxic respiratory failure: Continue 2 L/min oxygen via [place] and wean off oxygen as able. She was previously requiring oxygen via nonrebreathing mask 7 L/min oxygen via HFNC   Nausea, vomiting and diarrhea: Improved   Hypotension: BP has improved.   Hypomagnesemia: Replete with IV magnesium sulfate .   Addison's disease: Continue prednisone.   Comorbidities include multiple sclerosis, hypothyroidism, hypertension   Transfer patient from stepdown unit to progressive care unit.  Diet Order             Diet regular Room service appropriate? Yes; Fluid consistency: Thin  Diet effective now                            Consultants: None  Procedures: None    Medications:    Chlorhexidine Gluconate Cloth  6 each Topical Daily   enoxaparin (LOVENOX) injection  40  mg Subcutaneous Q24H   levothyroxine  50 mcg Oral QODAY   levothyroxine  75 mcg Oral QODAY   morphine  30 mg Oral 4 times per day   pantoprazole (PROTONIX) IV  40 mg Intravenous Q0600   predniSONE  4 mg Oral Q breakfast   Continuous Infusions:  azithromycin Stopped (01/25/24 1638)   ceFEPime (MAXIPIME) IV Stopped (01/25/24 2335)     Anti-infectives (From admission, onward)    Start     Dose/Rate Route Frequency Ordered Stop   01/25/24 1530  azithromycin (ZITHROMAX) 500 mg in sodium chloride 0.9 % 250 mL IVPB        500 mg 250 mL/hr over 60 Minutes Intravenous Every 24 hours 01/25/24 1436 01/30/24 1529   01/25/24 1400  vancomycin (VANCOREADY) IVPB 750 mg/150 mL  Status:  Discontinued        750 mg 150 mL/hr over 60 Minutes Intravenous Every 24 hours 01/24/24 1933 01/25/24 1157   01/24/24 2200  ceFEPIme (MAXIPIME) 2 g in sodium chloride 0.9 % 100 mL IVPB        2 g 200 mL/hr over 30 Minutes Intravenous Every 12 hours 01/24/24 1933     01/24/24 1245  ceFEPIme (MAXIPIME) 2 g in sodium chloride 0.9 % 100 mL IVPB        2 g 200 mL/hr over 30 Minutes Intravenous  Once 01/24/24 1238 01/24/24 1345   01/24/24  1245  metroNIDAZOLE (FLAGYL) IVPB 500 mg        500 mg 100 mL/hr over 60 Minutes Intravenous  Once 01/24/24 1238 01/24/24 1451   01/24/24 1245  vancomycin (VANCOCIN) IVPB 1000 mg/200 mL premix        1,000 mg 200 mL/hr over 60 Minutes Intravenous  Once 01/24/24 1238 01/24/24 1450              Family Communication/Anticipated D/C date and plan/Code Status   DVT prophylaxis: enoxaparin (LOVENOX) injection 40 mg Start: 01/24/24 2200     Code Status: Full Code  Family Communication: None Disposition Plan: Plan to discharge home   Status is: Inpatient Remains inpatient appropriate because: Acute respiratory failure, sepsis and bronchiolitis       Subjective:   Interval events noted.  She complains of congested cough and shortness of breath.  She does not use  oxygen at home.  Toni Amend, RN, was at the bedside.   Objective:    Vitals:   01/25/24 2110 01/25/24 2306 01/26/24 0400 01/26/24 0850  BP:      Pulse: 86     Resp: (!) 22     Temp:  97.9 F (36.6 C) 97.9 F (36.6 C) 98.6 F (37 C)  TempSrc:  Axillary Oral Oral  SpO2: 92%     Weight:      Height:       No data found.   Intake/Output Summary (Last 24 hours) at 01/26/2024 0909 Last data filed at 01/26/2024 0023 Gross per 24 hour  Intake 444.58 ml  Output --  Net 444.58 ml   Filed Weights   01/24/24 1247  Weight: 52.2 kg    Exam:  GEN: NAD SKIN: Warm and dry EYES: No pallor or icterus ENT: MMM CV: RRR PULM:  B/l rales, b/rhonchi ABD: soft, ND, NT, +BS CNS: AAO x 3, non focal EXT: Trace b/l leg edema, no tenderness        Data Reviewed:   I have personally reviewed following labs and imaging studies:  Labs: Labs show the following:   Basic Metabolic Panel: Recent Labs  Lab 01/24/24 1309 01/24/24 1345 01/25/24 0337  NA 135 138 135  K 3.4* 3.6 3.7  CL 101 100 98  CO2 22  --  22  GLUCOSE 94 99 60*  BUN 22 26* 23  CREATININE 0.61 0.70 0.54  CALCIUM 8.1*  --  7.3*  MG  --   --  1.4*   GFR Estimated Creatinine Clearance: 46.3 mL/min (by C-G formula based on SCr of 0.54 mg/dL). Liver Function Tests: Recent Labs  Lab 01/24/24 1309  AST 43*  ALT 21  ALKPHOS 42  BILITOT 0.6  PROT 7.0  ALBUMIN 3.4*   No results for input(s): "LIPASE", "AMYLASE" in the last 168 hours. No results for input(s): "AMMONIA" in the last 168 hours. Coagulation profile Recent Labs  Lab 01/24/24 1309  INR 1.0    CBC: Recent Labs  Lab 01/24/24 1309 01/24/24 1345 01/25/24 0337  WBC 9.8  --  7.0  NEUTROABS 9.0*  --   --   HGB 14.2 14.6 12.5  HCT 43.0 43.0 39.7  MCV 88.5  --  90.2  PLT 233  --  155   Cardiac Enzymes: No results for input(s): "CKTOTAL", "CKMB", "CKMBINDEX", "TROPONINI" in the last 168 hours. BNP (last 3 results) No results for input(s):  "PROBNP" in the last 8760 hours. CBG: No results for input(s): "GLUCAP" in the last 168 hours. D-Dimer:  No results for input(s): "DDIMER" in the last 72 hours. Hgb A1c: No results for input(s): "HGBA1C" in the last 72 hours. Lipid Profile: No results for input(s): "CHOL", "HDL", "LDLCALC", "TRIG", "CHOLHDL", "LDLDIRECT" in the last 72 hours. Thyroid function studies: Recent Labs    01/25/24 0337  TSH 0.262*   Anemia work up: No results for input(s): "VITAMINB12", "FOLATE", "FERRITIN", "TIBC", "IRON", "RETICCTPCT" in the last 72 hours. Sepsis Labs: Recent Labs  Lab 01/24/24 1309 01/24/24 1345 01/24/24 1951 01/25/24 0337  WBC 9.8  --   --  7.0  LATICACIDVEN  --  2.0* 1.7  --     Microbiology Recent Results (from the past 240 hours)  Blood Culture (routine x 2)     Status: None (Preliminary result)   Collection Time: 01/24/24  1:09 PM   Specimen: BLOOD  Result Value Ref Range Status   Specimen Description   Final    BLOOD RIGHT ANTECUBITAL Performed at [facility], [address], [place], [place] [address]    Special Requests   Final    BOTTLES DRAWN AEROBIC AND ANAEROBIC Blood Culture results may not be optimal due to an inadequate volume of blood received in culture bottles Performed at [facility], [address], [place], [place] [address]    Culture   Final    NO GROWTH 2 DAYS Performed at [facility], [address] [place], [place] [address]    Report Status PENDING  Incomplete  Resp panel by RT-PCR (RSV, Flu A&B, Covid) Anterior Nasal Swab     Status: None   Collection Time: 01/24/24  1:40 PM   Specimen: Anterior Nasal Swab  Result Value Ref Range Status   SARS Coronavirus 2 by RT PCR NEGATIVE NEGATIVE Final    Comment: (NOTE) SARS-CoV-2 target nucleic acids are NOT DETECTED.  The SARS-CoV-2 RNA is generally detectable in upper respiratory specimens during the acute phase of infection. The  lowest concentration of SARS-CoV-2 viral copies this assay can detect is 138 copies/mL. A negative result does not preclude SARS-Cov-2 infection and should not be used as the sole basis for treatment or other patient management decisions. A negative result may occur with  improper specimen collection/handling, submission of specimen other than nasopharyngeal swab, presence of viral mutation(s) within the areas targeted by this assay, and inadequate number of viral copies(<138 copies/mL). A negative result must be combined with clinical observations, patient history, and epidemiological information. The expected result is Negative.  Fact Sheet for Patients:  BloggerCourse.com  Fact Sheet for Healthcare Providers:  SeriousBroker.it  This test is no t yet approved or cleared by the [place] FDA and  has been authorized for detection and/or diagnosis of SARS-CoV-2 by FDA under an Emergency Use Authorization (EUA). This EUA will remain  in effect (meaning this test can be used) for the duration of the COVID-19 declaration under Section 564(b)(1) of the Act, 21 U.S.C.section 360bbb-3(b)(1), unless the authorization is terminated  or revoked sooner.       Influenza A by PCR NEGATIVE NEGATIVE Final   Influenza B by PCR NEGATIVE NEGATIVE Final    Comment: (NOTE) The Xpert Xpress SARS-CoV-2/FLU/RSV plus assay is intended as an aid in the diagnosis of influenza from Nasopharyngeal swab specimens and should not be used as a sole basis for treatment. Nasal washings and aspirates are unacceptable for Xpert Xpress SARS-CoV-2/FLU/RSV testing.  Fact Sheet for Patients: BloggerCourse.com  Fact Sheet for Healthcare Providers: SeriousBroker.it  This test  is not yet approved or cleared by the [place] and has been authorized for detection and/or diagnosis of SARS-CoV-2 by FDA under  an Emergency Use Authorization (EUA). This EUA will remain in effect (meaning this test can be used) for the duration of the COVID-19 declaration under Section 564(b)(1) of the Act, 21 U.S.C. section 360bbb-3(b)(1), unless the authorization is terminated or revoked.     Resp Syncytial Virus by PCR NEGATIVE NEGATIVE Final    Comment: (NOTE) Fact Sheet for Patients: BloggerCourse.com  Fact Sheet for Healthcare Providers: SeriousBroker.it  This test is not yet approved or cleared by the [place] FDA and has been authorized for detection and/or diagnosis of SARS-CoV-2 by FDA under an Emergency Use Authorization (EUA). This EUA will remain in effect (meaning this test can be used) for the duration of the COVID-19 declaration under Section 564(b)(1) of the Act, 21 U.S.C. section 360bbb-3(b)(1), unless the authorization is terminated or revoked.  Performed at [facility], [address] [place], [place] [address]   MRSA Next Gen by PCR, Nasal     Status: None   Collection Time: 01/24/24 11:41 PM   Specimen: Nasal Mucosa; Nasal Swab  Result Value Ref Range Status   MRSA by PCR Next Gen NOT DETECTED NOT DETECTED Final    Comment: (NOTE) The GeneXpert MRSA Assay (FDA approved for NASAL specimens only), is one component of a comprehensive MRSA colonization surveillance program. It is not intended to diagnose MRSA infection nor to guide or monitor treatment for MRSA infections. Test performance is not FDA approved in patients less than 63 years old. Performed at [facility], [address], [place], [place] [address]   Blood Culture (routine x 2)     Status: None (Preliminary result)   Collection Time: 01/25/24  3:37 AM   Specimen: BLOOD  Result Value Ref Range Status   Specimen Description   Final    BLOOD BLOOD RIGHT HAND Performed at [facility], [address], [place], [place] [address]    Special Requests   Final    BOTTLES DRAWN AEROBIC ONLY Blood Culture results may not be optimal due to an inadequate volume of blood received in culture bottles Performed at [facility], [address] [place], [place] [address]    Culture   Final    NO GROWTH 1 DAY Performed at [facility], [address] [place], [place] [address]    Report Status PENDING  Incomplete    Procedures and diagnostic studies:  DG Chest Portable 1 View Result Date: 01/24/2024 CLINICAL DATA:  Respiratory distress. EXAM: PORTABLE CHEST 1 VIEW COMPARISON:  Radiograph and CT earlier today FINDINGS: Normal heart size with stable mediastinal contours. Majority of the tree-in-bud opacities on CT have no radiographic correlate. No progressive airspace disease, a developing pulmonary edema, pleural effusion or pneumothorax. Spinal stimulator in place. Scoliosis. IMPRESSION: Majority of the tree-in-bud opacities on CT have no radiographic correlate. No progressive airspace disease. Electronically Signed   By: Narda Rutherford M.D.   On: 01/24/2024 18:30   CT Chest Wo Contrast Result Date: 01/24/2024 CLINICAL DATA:  Nausea vomiting diarrhea low oxygen saturation EXAM: CT CHEST WITHOUT CONTRAST TECHNIQUE: Multidetector CT imaging of the chest was performed following the standard protocol without IV contrast. RADIATION DOSE REDUCTION: This exam was performed according to the departmental dose-optimization program which includes automated exposure control, adjustment of the mA and/or kV according to patient size and/or  use of iterative reconstruction technique. COMPARISON:  Chest x-ray 01/24/2024, CT chest 09/06/2019, 03/22/2015 FINDINGS: Cardiovascular: Limited evaluation without intravenous contrast. Nonaneurysmal aorta. Mild atherosclerosis. Normal cardiac size. No pericardial effusion Mediastinum/Nodes: Patent trachea. No thyroid mass. No suspicious lymph nodes. Mild  air distension of the esophagus. Mild circumferential distal esophageal thickening and mild thickening at the GE junction. Small hiatal hernia. Lungs/Pleura: Subpleural reticulation in the left upper lobe with mild lingular bronchiectasis and consolidation likely due to scarring and post radiation change. Scattered bilateral central lobular ground-glass density and tree-in-bud density, most notable in the left lower lobe. No pleural effusion or pneumothorax Upper Abdomen: No acute finding. Small kidney stones. Cholecystectomy Musculoskeletal: Scoliosis and degenerative changes of the spine. Chronic compression fracture at L1. Ascending thoracic stimulator leads. Status post left mastectomy. IMPRESSION: 1. Scattered bilateral central lobular ground-glass density and tree-in-bud density, most notable in the left lower lobe, findings are most consistent with infectious or inflammatory bronchiolitis. 2. Subpleural reticulation in the left upper lobe with mild lingular bronchiectasis and consolidation likely due to scarring and post radiation change. 3. Small hiatal hernia with mild circumferential distal esophageal thickening and mild thickening at the GE junction, findings could be due to esophagitis or reflux, consider correlation with endoscopy. 4. Aortic atherosclerosis. Aortic Atherosclerosis (ICD10-I70.0). Electronically Signed   By: Jasmine Pang M.D.   On: 01/24/2024 15:16   DG Chest Port 1 View Result Date: 01/24/2024 CLINICAL DATA:  Sepsis. EXAM: PORTABLE CHEST 1 VIEW COMPARISON:  February 13, 2023. FINDINGS: The heart size and mediastinal contours are within normal limits. Both lungs are clear. Scoliosis of thoracic spine is noted. IMPRESSION: No active disease. Electronically Signed   By: Lupita Raider M.D.   On: 01/24/2024 13:16               LOS: 2 days   Suhana Wilner  [facility]   Pager on [email or web address]. If 7PM-7AM, please contact night-coverage at  [email or web address]     01/26/2024, 9:09 AM

## 2024-01-26 NOTE — Evaluation (Signed)
 Physical Therapy Evaluation Patient Details Name: Daisy [name] MRN: [id number] DOB: 05-14-1951 Today's Date: 01/26/2024  History of Present Illness  Pt admitted from home 2* PNA and sepsis.  Pt with hx of Breast CA s/p R mastectomy, MS - relapsing and remitting, DDD, COPD, Fibromyalgia, brittle bone disease, Addisons disease, back surgery, spinal stimulator implant, and frequent falls  Clinical Impression  Pt admitted as above and presenting with functional mobility limitations 2* generalized weakness, balance deficits and limited activity tolerance.  This date, pt cooperative but limited to sitting EOB by c/o increasing dizziness and nausea - BP supine 114/49 and sitting EOB 118/58 - RN aware.  Pt hopes to progress to return home with spouse to previous living arrangement.  Pt could benefit from follow up HHPT dependent on acute stay progress.      If plan is discharge home, recommend the following: A lot of help with walking and/or transfers;A little help with bathing/dressing/bathroom;Assistance with cooking/housework;Assist for transportation;Help with stairs or ramp for entrance   Can travel by private vehicle        Equipment Recommendations None recommended by PT  Recommendations for Other Services  OT consult    Functional Status Assessment Patient has had a recent decline in their functional status and demonstrates the ability to make significant improvements in function in a reasonable and predictable amount of time.     Precautions / Restrictions Precautions Precautions: Fall Restrictions Weight Bearing Restrictions Per Provider Order: No      Mobility  Bed Mobility Overal bed mobility: Needs Assistance Bed Mobility: Supine to Sit, Sit to Supine     Supine to sit: Min assist Sit to supine: Min assist   General bed mobility comments: Use of bedrails and min assist to manage LEs    Transfers                   General transfer comment: deferred 2*  increasing nausea    Ambulation/Gait                  Stairs            Wheelchair Mobility     Tilt Bed    Modified Rankin (Stroke Patients Only)       Balance Overall balance assessment: Needs assistance Sitting-balance support: No upper extremity supported, Feet supported Sitting balance-Leahy Scale: Fair                                       Pertinent Vitals/Pain Pain Assessment Pain Assessment: No/denies pain    Home Living Family/patient expects to be discharged to:: Private residence Living Arrangements: Spouse/significant other Available Help at Discharge: Family;Available 24 hours/day Type of Home: House Home Access: Stairs to enter Entrance Stairs-Rails: Doctor, general practice of Steps: 7   Home Layout: Multi-level;Able to live on main level with bedroom/bathroom Home Equipment: Rolling Walker (2 wheels);Cane - single point;BSC/3in1 Additional Comments: Pt using RW or holding to husband as needed    Prior Function Prior Level of Function : Needs assist       Physical Assist : Mobility (physical)     Mobility Comments: holds to spouse when out of house to prevent falls; says uses RW but has still fallen       Extremity/Trunk Assessment   Upper Extremity Assessment Upper Extremity Assessment: Generalized weakness    Lower Extremity Assessment Lower Extremity Assessment: Generalized  weakness       Communication   Communication Communication: No apparent difficulties    Cognition Arousal: Alert Behavior During Therapy: WFL for tasks assessed/performed, Impulsive   PT - Cognitive impairments: No apparent impairments                                 Cueing Cueing Techniques: Verbal cues     General Comments      Exercises     Assessment/Plan    PT Assessment Patient needs continued PT services  PT Problem List Decreased strength;Decreased range of motion;Decreased activity  tolerance;Decreased balance;Decreased mobility;Decreased knowledge of use of DME       PT Treatment Interventions DME instruction;Gait training;Stair training;Functional mobility training;Therapeutic activities;Therapeutic exercise;Balance training;Patient/family education    PT Goals (Current goals can be found in the Care Plan section)  Acute Rehab PT Goals Patient Stated Goal: Regain IND PT Goal Formulation: With patient Time For Goal Achievement: 02/09/24 Potential to Achieve Goals: Good    Frequency Min 1X/week     Co-evaluation               AM-PAC PT "6 Clicks" Mobility  Outcome Measure Help needed turning from your back to your side while in a flat bed without using bedrails?: A Little Help needed moving from lying on your back to sitting on the side of a flat bed without using bedrails?: A Little Help needed moving to and from a bed to a chair (including a wheelchair)?: A Lot Help needed standing up from a chair using your arms (e.g., wheelchair or bedside chair)?: A Lot Help needed to walk in hospital room?: Total Help needed climbing 3-5 steps with a railing? : Total 6 Click Score: 12    End of Session Equipment Utilized During Treatment: Oxygen;Gait belt Activity Tolerance: Patient limited by fatigue;Other (comment) (nausea) Patient left: in bed;with call bell/phone within reach;with bed alarm set Nurse Communication: Mobility status (request for nausea meds) PT Visit Diagnosis: Difficulty in walking, not elsewhere classified (R26.2)    Time: 8413-2440 PT Time Calculation (min) (ACUTE ONLY): 21 min   Charges:   PT Evaluation $PT Eval Low Complexity: 1 Low   PT General Charges $$ ACUTE PT VISIT: 1 Visit         Mauro Kaufmann PT Acute Rehabilitation Services Pager [phone] Office [phone]   Daisy [name] 01/26/2024, 1:02 PM

## 2024-01-26 NOTE — Plan of Care (Signed)
  Problem: Clinical Measurements: Goal: Diagnostic test results will improve Outcome: Progressing Goal: Signs and symptoms of infection will decrease Outcome: Progressing   Problem: Education: Goal: Knowledge of General Education information will improve Description: Including pain rating scale, medication(s)/side effects and non-pharmacologic comfort measures Outcome: Progressing   Problem: Health Behavior/Discharge Planning: Goal: Ability to manage health-related needs will improve Outcome: Progressing   Problem: Clinical Measurements: Goal: Ability to maintain clinical measurements within normal limits will improve Outcome: Progressing Goal: Will remain free from infection Outcome: Progressing Goal: Diagnostic test results will improve Outcome: Progressing Goal: Respiratory complications will improve Outcome: Progressing Goal: Cardiovascular complication will be avoided Outcome: Progressing

## 2024-01-27 DIAGNOSIS — A419 Sepsis, unspecified organism: Secondary | ICD-10-CM | POA: Diagnosis not present

## 2024-01-27 LAB — BASIC METABOLIC PANEL
Anion gap: 8 (ref 5–15)
BUN: 17 mg/dL (ref 8–23)
CO2: 22 mmol/L (ref 22–32)
Calcium: 7.7 mg/dL — ABNORMAL LOW (ref 8.9–10.3)
Chloride: 104 mmol/L (ref 98–111)
Creatinine, Ser: 0.58 mg/dL (ref 0.44–1.00)
GFR, Estimated: >60 mL/min (ref >60)
Glucose, Bld: 63 mg/dL — ABNORMAL LOW (ref 70–99)
Potassium: 4 mmol/L (ref 3.5–5.1)
Sodium: 134 mmol/L — ABNORMAL LOW (ref 135–145)

## 2024-01-27 LAB — MAGNESIUM: Magnesium: 2.5 mg/dL — ABNORMAL HIGH (ref 1.7–2.4)

## 2024-01-27 MED ORDER — AZITHROMYCIN 250 MG PO TABS
250.0000 mg | ORAL_TABLET | Freq: Every day | ORAL | Status: DC
  Administered 2024-01-27 – 2024-01-31 (×5): 250 mg via ORAL
  Filled 2024-01-27 (×6): qty 1

## 2024-01-27 MED ORDER — PANTOPRAZOLE SODIUM 20 MG PO TBEC
20.0000 mg | DELAYED_RELEASE_TABLET | Freq: Every day | ORAL | Status: DC
  Administered 2024-01-28 – 2024-01-31 (×4): 20 mg via ORAL
  Filled 2024-01-27 (×4): qty 1

## 2024-01-27 MED ORDER — CEFADROXIL 500 MG PO CAPS
500.0000 mg | ORAL_CAPSULE | Freq: Two times a day (BID) | ORAL | Status: DC
  Administered 2024-01-27 – 2024-01-29 (×5): 500 mg via ORAL
  Filled 2024-01-27 (×5): qty 1

## 2024-01-27 NOTE — Hospital Course (Signed)
 73 y.o. F with MS in remission, hypopituitarism on chronic steroids, chronic pain on daily morphine IR, scoliosis with spinal stimulator, anxiety and esophageal stricture s/p dilation who presented with painful cough, found to have pneumonia.

## 2024-01-27 NOTE — Progress Notes (Signed)
  Progress Note   Patient: Daisy [name] WNU:[id number] DOB: 07-21-1951 DOA: 01/24/2024     3 DOS: the patient was seen and examined on 01/27/2024 at 9:40AM      Brief hospital course: 73 y.o. F with MS in remission, hypopituitarism on chronic steroids, chronic pain on daily morphine IR, scoliosis with spinal stimulator, anxiety and esophageal stricture s/p dilation who presented with painful cough, found to have pneumonia.     Assessment and Plan: Acute respiratory failure due to pneumonia Pneumonia sepsis Presented with tachypnea, distress, hypoxia requiring 100% FiO2 by Nonrebreather Now improving on antibiotics - Continue antibiotics - Continue flutter and IS   Hypopituitarism Hypothyroidism -Continue home prednisone, levothyroxine  Chronic pain syndrome -Continue home morphine  Multiple sclerosis Not on disease modifying agent, does not follow with Neuro  Hypertension Blood pressure normal - Hold losartan, amlodipine, spironolactone  GERD - Resume home oral pantoprazole      Subjective: Patient still has a rattling cough, pleuritic chest pain.  No fever, no confusion, no respiratory symptoms.     Physical Exam: BP 127/87 (BP Location: Right Arm)   Pulse 93   Temp 98.1 F (36.7 C) (Oral)   Resp 18   Ht 5' (1.524 m)   Wt 52.2 kg   SpO2 94%   BMI 22.46 kg/m   Thin adult female, lying in bed, interactive and appropriate RRR, no murmurs, no peripheral edema Respiratory rate normal, lungs clear without rales or wheezes Abdomen soft no tenderness palpation or guarding, no ascites or distention Attention normal, affect normal, judgment and insight appear normal, generalized weakness but symmetric strength    Data Reviewed: Basic metabolic panel unremarkable TSH low  Family Communication: called to Husband, no answer    Disposition: Status is: Inpatient         Author: Alberteen Sam, MD 01/27/2024 1:42 PM  For on call review  [email or web address]

## 2024-01-27 NOTE — Progress Notes (Signed)
 Pt refused lovenox, and I offer scd's and she refusing. I educated her on the purpose of the medication and she still declining.

## 2024-01-27 NOTE — Plan of Care (Signed)

## 2024-01-28 ENCOUNTER — Inpatient Hospital Stay (HOSPITAL_COMMUNITY): Payer: Medicare Other

## 2024-01-28 DIAGNOSIS — A419 Sepsis, unspecified organism: Secondary | ICD-10-CM | POA: Diagnosis not present

## 2024-01-28 MED ORDER — ONDANSETRON HCL 4 MG PO TABS
4.0000 mg | ORAL_TABLET | Freq: Three times a day (TID) | ORAL | Status: DC | PRN
  Administered 2024-01-28 – 2024-01-31 (×7): 4 mg via ORAL
  Filled 2024-01-28 (×7): qty 1

## 2024-01-28 NOTE — Progress Notes (Signed)
 Mobility Specialist - Progress Note Nurse requested Mobility Specialist to perform oxygen saturation test with pt which includes removing pt from oxygen both at rest and while ambulating.  Below are the results from that testing.     Patient Saturations on Room Air at Rest = spO2 92%  Patient Saturations on Room Air while Ambulating = sp02 87% .  Patient Saturations on 2 Liters of oxygen while Ambulating = sp02 90%  At end of testing pt left in room on 2  Liters of oxygen.  Reported results to nurse.     01/28/24 0837  Mobility  Activity Ambulated with assistance in hallway  Level of Assistance Contact guard assist, steadying assist  Assistive Device Front wheel walker  Distance Ambulated (ft) 70 ft  Range of Motion/Exercises Active  Activity Response Tolerated well  Mobility Referral Yes  Mobility visit 1 Mobility  Mobility Specialist Start Time (ACUTE ONLY) [id number]  Mobility Specialist Stop Time (ACUTE ONLY) 0830  Mobility Specialist Time Calculation (min) (ACUTE ONLY) 19 min   Pt was found in bed and agreeable to ambulate. Grew fatigued with session. At EOS returned to bed with all needs met. Call bell in reach and RN notified.  Billey Chang Mobility Specialist

## 2024-01-28 NOTE — Progress Notes (Signed)
 SATURATION QUALIFICATIONS: (This note is used to comply with regulatory documentation for home oxygen)  Patient Saturations on Room Air at Rest = 92%  Patient Saturations on Room Air while Ambulating = 87%  Patient Saturations on 2 Liters of oxygen while Ambulating = 90%  Please briefly explain why patient needs home oxygen: Patient ambulated about 70 ft and sp02 sats started to drop lower than 88%. Patient may benefit from acute oxygen therapy via [place] at 2L/min during mobility and PRN.

## 2024-01-28 NOTE — Progress Notes (Signed)
 Physical Therapy Treatment Patient Details Name: Daisy [name] MRN: [id number] DOB: Jan 07, 1951 Today's Date: 01/28/2024   History of Present Illness Pt admitted from home 2* PNA and sepsis.  Pt with hx of Breast CA s/p R mastectomy, MS - relapsing and remitting, DDD, COPD, Fibromyalgia, brittle bone disease, Addisons disease, back surgery, spinal stimulator implant, and frequent falls    PT Comments  Pt agreeable to working with therapy. Pt presents with general weakness, decreased activity tolerance, and impaired gait and balance. She is at risk for falls when mobilizing. She reports not feeling well on today. She does not feel ready to d/c home on today. Explained to pt that MD determines medical readiness/discharge. Pt could likely benefit from a continued stay in hospital to continue to work with therapy a bit before returning home. She is agreeable to HHPT f/u.     If plan is discharge home, recommend the following: A little help with walking and/or transfers;A little help with bathing/dressing/bathroom;Assistance with cooking/housework;Assist for transportation;Help with stairs or ramp for entrance   Can travel by private vehicle        Equipment Recommendations  None recommended by PT    Recommendations for Other Services       Precautions / Restrictions Precautions Precautions: Fall Precaution/Restrictions Comments: monitor O2 Restrictions Weight Bearing Restrictions Per Provider Order: No     Mobility  Bed Mobility Overal bed mobility: Needs Assistance Bed Mobility: Supine to Sit, Sit to Supine     Supine to sit: Supervision, HOB elevated, Used rails Sit to supine: Supervision, HOB elevated, Used rails   General bed mobility comments: Increased time.    Transfers Overall transfer level: Needs assistance Equipment used: Rolling walker (2 wheels) Transfers: Sit to/from Stand Sit to Stand: Min assist           General transfer comment: Assist to rise,  steady, control descent. Cues for safety    Ambulation/Gait Ambulation/Gait assistance: Min assist Gait Distance (Feet): 40 Feet Assistive device: Rolling walker (2 wheels) Gait Pattern/deviations: Step-through pattern, Decreased stride length       General Gait Details: Min A for steadying throughout distance. Ambulation distance limited by fatigue, weakness. Dyspnea 2/4. O2 88% on RA.   Stairs             Wheelchair Mobility     Tilt Bed    Modified Rankin (Stroke Patients Only)       Balance Overall balance assessment: Needs assistance   Sitting balance-Leahy Scale: Good     Standing balance support: Bilateral upper extremity supported, Reliant on assistive device for balance Standing balance-Leahy Scale: Poor                              Communication Communication Communication: No apparent difficulties  Cognition Arousal: Alert Behavior During Therapy: WFL for tasks assessed/performed   PT - Cognitive impairments: No apparent impairments                                Cueing Cueing Techniques: Verbal cues  Exercises      General Comments        Pertinent Vitals/Pain Pain Assessment Pain Assessment: No/denies pain    Home Living                          Prior Function  PT Goals (current goals can now be found in the care plan section) Progress towards PT goals: Progressing toward goals    Frequency    Min 1X/week      PT Plan      Co-evaluation              AM-PAC PT "6 Clicks" Mobility   Outcome Measure  Help needed turning from your back to your side while in a flat bed without using bedrails?: A Little Help needed moving from lying on your back to sitting on the side of a flat bed without using bedrails?: A Little Help needed moving to and from a bed to a chair (including a wheelchair)?: A Little Help needed standing up from a chair using your arms (e.g., wheelchair or  bedside chair)?: A Little Help needed to walk in hospital room?: A Little Help needed climbing 3-5 steps with a railing? : A Lot 6 Click Score: 17    End of Session Equipment Utilized During Treatment: Gait belt Activity Tolerance: Patient limited by fatigue Patient left: in bed;with call bell/phone within reach   PT Visit Diagnosis: Difficulty in walking, not elsewhere classified (R26.2);Muscle weakness (generalized) (M62.81)     Time: 0865-7846 PT Time Calculation (min) (ACUTE ONLY): 24 min  Charges:    $Gait Training: 23-37 mins PT General Charges $$ ACUTE PT VISIT: 1 Visit                        Faye Ramsay, PT Acute Rehabilitation  Office: [phone]

## 2024-01-28 NOTE — Progress Notes (Signed)
  Progress Note   Patient: Daisy [name] ZOX:[id number] DOB: 09-11-1951 DOA: 01/24/2024     4 DOS: the patient was seen and examined on 01/28/2024 at 11:50 AM      Brief hospital course: 73 y.o. F with MS in remission, hypopituitarism on chronic steroids, chronic pain on daily morphine IR, scoliosis with spinal stimulator, anxiety and esophageal stricture s/p dilation who presented with painful cough, found to have pneumonia.     Assessment and Plan: Pneumonia Patient still desaturating with minimal exertion. - Continue antibiotics - Continue flutter and incentive spirometry -Repeat two-view chest x-ray   Hypopituitarism Hypothyroidism - Continue home prednisone and levothyroxine  Multiple sclerosis Quiescent  Hypertension Blood pressure normal off medicines - Hold home losartan, amlodipine, and spironolactone  Chronic pain syndrome - Continue home morphine       Subjective: Still hypoxic, severely weak, overall poor state.  Mentation is normal, no fever.     Physical Exam: BP 131/74 (BP Location: Left Arm)   Pulse 82   Temp 98.6 F (37 C) (Oral)   Resp 19   Ht 5' (1.524 m)   Wt 52.2 kg   SpO2 91%   BMI 22.46 kg/m   Thin adult female, lying in bed, interactive and appropriate RRR, no murmurs, no peripheral edema Respiratory effort shallow and weak, lungs diminished, coarse crackles bilaterally, coarse cough Abdomen soft, no tenderness palpation or guarding, no ascites or distention Attention normal, affect normal, judgment and insight appear normal    Data Reviewed: Chest x-ray ordered    Family Communication: Husband at the bedside    Disposition: Status is: Inpatient         Author: Alberteen Sam, MD 01/28/2024 3:11 PM  For on call review [email or web address].

## 2024-01-29 DIAGNOSIS — A419 Sepsis, unspecified organism: Secondary | ICD-10-CM | POA: Diagnosis not present

## 2024-01-29 LAB — CBC
HCT: 36.9 % (ref 36.0–46.0)
Hemoglobin: 11.5 g/dL — ABNORMAL LOW (ref 12.0–15.0)
MCH: 28 pg (ref 26.0–34.0)
MCHC: 31.2 g/dL (ref 30.0–36.0)
MCV: 89.8 fL (ref 80.0–100.0)
Platelets: 152 10*3/uL (ref 150–400)
RBC: 4.11 MIL/uL (ref 3.87–5.11)
RDW: 14.6 % (ref 11.5–15.5)
WBC: 5.6 10*3/uL (ref 4.0–10.5)
nRBC: 0 % (ref 0.0–0.2)

## 2024-01-29 LAB — CULTURE, BLOOD (ROUTINE X 2): Culture: NO GROWTH

## 2024-01-29 LAB — COMPREHENSIVE METABOLIC PANEL
ALT: 31 U/L (ref 0–44)
AST: 39 U/L (ref 15–41)
Albumin: 2.6 g/dL — ABNORMAL LOW (ref 3.5–5.0)
Alkaline Phosphatase: 53 U/L (ref 38–126)
Anion gap: 9 (ref 5–15)
BUN: 8 mg/dL (ref 8–23)
CO2: 27 mmol/L (ref 22–32)
Calcium: 8 mg/dL — ABNORMAL LOW (ref 8.9–10.3)
Chloride: 100 mmol/L (ref 98–111)
Creatinine, Ser: 0.45 mg/dL (ref 0.44–1.00)
GFR, Estimated: >60 mL/min (ref >60)
Glucose, Bld: 107 mg/dL — ABNORMAL HIGH (ref 70–99)
Potassium: 3.5 mmol/L (ref 3.5–5.1)
Sodium: 136 mmol/L (ref 135–145)
Total Bilirubin: 0.6 mg/dL (ref 0.0–1.2)
Total Protein: 6.1 g/dL — ABNORMAL LOW (ref 6.5–8.1)

## 2024-01-29 MED ORDER — SODIUM CHLORIDE 0.9 % IV SOLN: INTRAVENOUS | Status: DC

## 2024-01-29 MED ORDER — SODIUM CHLORIDE 0.9 % IV SOLN: 2.0000 g | INTRAVENOUS | Status: DC

## 2024-01-29 MED ORDER — CEFADROXIL 500 MG PO CAPS
500.0000 mg | ORAL_CAPSULE | Freq: Two times a day (BID) | ORAL | Status: DC
  Administered 2024-01-29 – 2024-01-31 (×4): 500 mg via ORAL
  Filled 2024-01-29 (×4): qty 1

## 2024-01-29 NOTE — Progress Notes (Signed)
  Progress Note   Patient: Daisy [name] [id number]:[id number] DOB: Sep 20, 1951 DOA: 01/24/2024     5 DOS: the patient was seen and examined on 01/29/2024 at 11:30AM      Brief hospital course: 73 y.o. F with MS in remission, hypopituitarism on chronic steroids, chronic pain on daily morphine IR, scoliosis with spinal stimulator, anxiety and esophageal stricture s/p dilation who presented with painful cough, found to have pneumonia.     Assessment and Plan: Multifocal pneumonia Sepsis due to pneumonia Acute respiratory failure due to sepsis Presented with tachypnea, respiratory distress, fever, and hypoxia requiring supplemental oxygen by nonrebreather.  Was improving with antibiotics, but remains persistently hypoxic, has poor oral intake, and today was too orthostatic to stand. - Continue antibiotics, resume intravenous antibiotics - Start IV fluids - Continue aggressive incentive spirometer and flutter   Hypopituitarism Hypothyroidism - Continue home prednisone, levothyroxine  Chronic pain syndrome - Continue home morphine  Hypertension Blood pressure soft - Hold losartan, amlodipine, spironolactone -Continue home aspirin  Multiple sclerosis Daughter at bedside is modifying agent, does not follow with neuro  GERD - Continue pantoprazole       Subjective: Patient still very dizzy with sitting up, she is hardly eating anything.  She had a temp up to 100.2 overnight.  She still has severe chest tightness and cough.     Physical Exam: BP 135/82   Pulse 78   Temp 100.2 F (37.9 C) (Oral)   Resp 18   Ht 5' (1.524 m)   Wt 52.2 kg   SpO2 91%   BMI 22.46 kg/m   Thin adult female, lying in bed, appears tired and weak, barely moves RRR, no murmurs, no peripheral edema Respiratory rate seems normal, lungs clear without rales or wheezes, she has rattling cough Abdomen soft, no tenderness to palpation or guarding, no ascites or distention Attention normal, affect  blunted, judgment and insight appear normal, severe generalized symmetric weakness but symmetric     Data Reviewed: Basic metabolic panel shows no transaminitis, normal renal function and electrolytes CBC shows no leukocytosis, just mild anemia X-ray yesterday shows no new infiltrates,    Family Communication: Husband at the bedside    Disposition: Status is: Inpatient Patient was admitted with hypoxia respiratory failure from what appears to be pneumonia sepsis  She is not resolving well, and remains hypoxic and orthostatic today  Will give IV fluids, if orthostasis resolves, and she weans off of oxygen, possibly home tomorrow or Thursday        Author: Alberteen Sam, MD 01/29/2024 3:39 PM  For on call review [email or web address]

## 2024-01-29 NOTE — Progress Notes (Signed)
 Physical Therapy Treatment Patient Details Name: Daisy [name] MRN: [id number] DOB: 1951/04/08 Today's Date: 01/29/2024   History of Present Illness Pt admitted from home 2* PNA and sepsis.  Pt with hx of Breast CA s/p R mastectomy, MS - relapsing and remitting, DDD, COPD, Fibromyalgia, brittle bone disease, Addisons disease, back surgery, spinal stimulator implant, and frequent falls    PT Comments  Pt agreeable to working with therapy with some encouragement. Assessed SpO2 at rest on RA: 86%-reapplied [place] O2-sats recovered to 94%. Pt continues to report dizziness and nausea. Supine head roll test: (-) nystagmus, pt reported mild dizziness/no spinning. Sat EOB-pt reported dizziness/spinning: no nystagmus visualized. Removed [employer] O2 for ambulation. Pt tolerated short distance fairly well-no overt LOB with RW use-O2 87-88%. Assisted pt back to bed at her request. Cues for pursed lip breathing. Sats recovered to 90% on RA with time. Made RN aware. Pt plans to return home where she lives with her husband. Recommend HHPT f/u.    If plan is discharge home, recommend the following: A little help with walking and/or transfers;A little help with bathing/dressing/bathroom;Assistance with cooking/housework;Assist for transportation;Help with stairs or ramp for entrance   Can travel by private vehicle        Equipment Recommendations  None recommended by PT    Recommendations for Other Services OT consult     Precautions / Restrictions Precautions Precautions: Fall Precaution/Restrictions Comments: monitor O2 Restrictions Weight Bearing Restrictions Per Provider Order: No     Mobility  Bed Mobility Overal bed mobility: Needs Assistance Bed Mobility: Supine to Sit, Sit to Supine     Supine to sit: Supervision, HOB elevated, Used rails Sit to supine: Supervision, HOB elevated, Used rails   General bed mobility comments: Increased time. C/o dizziness. No nystagmus visualized. Had pt perform  supine head roll test prior to sitting up-pt reported mild "wooziness"-no nystagmus visualilzed    Transfers Overall transfer level: Needs assistance Equipment used: Rolling walker (2 wheels) Transfers: Sit to/from Stand Sit to Stand: Contact guard assist           General transfer comment: Increased time. Cues for safety, hand placement.    Ambulation/Gait Ambulation/Gait assistance: Min assist Gait Distance (Feet): 40 Feet Assistive device: Rolling walker (2 wheels)         General Gait Details: intermittent steadying. No overt LOB. O2 87-88% on RA. Dyspnea 2/4   Stairs             Wheelchair Mobility     Tilt Bed    Modified Rankin (Stroke Patients Only)       Balance Overall balance assessment: Needs assistance         Standing balance support: Bilateral upper extremity supported, Reliant on assistive device for balance Standing balance-Leahy Scale: Poor                              Communication Communication Communication: No apparent difficulties  Cognition Arousal: Alert Behavior During Therapy: WFL for tasks assessed/performed   PT - Cognitive impairments: No apparent impairments                         Following commands: Intact      Cueing Cueing Techniques: Verbal cues  Exercises      General Comments        Pertinent Vitals/Pain Pain Assessment Pain Assessment: No/denies pain    Home Living  Prior Function            PT Goals (current goals can now be found in the care plan section) Progress towards PT goals: Progressing toward goals    Frequency    Min 1X/week      PT Plan      Co-evaluation              AM-PAC PT "6 Clicks" Mobility   Outcome Measure  Help needed turning from your back to your side while in a flat bed without using bedrails?: None Help needed moving from lying on your back to sitting on the side of a flat bed without  using bedrails?: None Help needed moving to and from a bed to a chair (including a wheelchair)?: A Little Help needed standing up from a chair using your arms (e.g., wheelchair or bedside chair)?: A Little Help needed to walk in hospital room?: A Little Help needed climbing 3-5 steps with a railing? : A Lot 6 Click Score: 19    End of Session Equipment Utilized During Treatment: Gait belt Activity Tolerance: Patient limited by fatigue Patient left: in bed;with call bell/phone within reach;with bed alarm set   PT Visit Diagnosis: Difficulty in walking, not elsewhere classified (R26.2);Muscle weakness (generalized) (M62.81)     Time: 1610-9604 PT Time Calculation (min) (ACUTE ONLY): 32 min  Charges:    $Gait Training: 23-37 mins PT General Charges $$ ACUTE PT VISIT: 1 Visit                        Faye Ramsay, PT Acute Rehabilitation  Office: [phone]

## 2024-01-29 NOTE — Care Plan (Signed)
 Pt anticipates discharge tomorrow. Encouraged pulmonary hygiene. See MAR for pain management. IV team unable to get access, needs Picc line. Iv abx and fluids not given. Provider aware.

## 2024-01-29 NOTE — Progress Notes (Signed)
 Dear Doctor: Maryfrances Bunnell,  This patient has been identified as a candidate for PICC for the following reason (s): poor veins/poor circulatory system (CHF, COPD, emphysema, diabetes, steroid use, IV drug abuse, etc.) If you agree, please write an order for the indicated device. For any questions contact the Vascular Access Team at [phone] if no answer, please leave a message.  Thank you for supporting the early vascular access assessment program.

## 2024-01-29 NOTE — Progress Notes (Signed)
 At bedside for PIV insertion. No suitable vessels identified for cannulation. Utilized ultrasound on bilateral arms. Vessels bifurcating and too dep in upper arms. Recommend central access for any IV needs. RN aware.

## 2024-01-30 ENCOUNTER — Inpatient Hospital Stay (HOSPITAL_COMMUNITY): Payer: Medicare Other

## 2024-01-30 DIAGNOSIS — E271 Primary adrenocortical insufficiency: Secondary | ICD-10-CM | POA: Diagnosis not present

## 2024-01-30 DIAGNOSIS — E039 Hypothyroidism, unspecified: Secondary | ICD-10-CM | POA: Diagnosis not present

## 2024-01-30 LAB — CBC
HCT: 33.1 % — ABNORMAL LOW (ref 36.0–46.0)
Hemoglobin: 10.7 g/dL — ABNORMAL LOW (ref 12.0–15.0)
MCH: 28.5 pg (ref 26.0–34.0)
MCHC: 32.3 g/dL (ref 30.0–36.0)
MCV: 88.3 fL (ref 80.0–100.0)
Platelets: 155 10*3/uL (ref 150–400)
RBC: 3.75 MIL/uL — ABNORMAL LOW (ref 3.87–5.11)
RDW: 14.3 % (ref 11.5–15.5)
WBC: 5.4 10*3/uL (ref 4.0–10.5)
nRBC: 0 % (ref 0.0–0.2)

## 2024-01-30 LAB — BASIC METABOLIC PANEL
Anion gap: 12 (ref 5–15)
BUN: 8 mg/dL (ref 8–23)
CO2: 25 mmol/L (ref 22–32)
Calcium: 8 mg/dL — ABNORMAL LOW (ref 8.9–10.3)
Chloride: 99 mmol/L (ref 98–111)
Creatinine, Ser: 0.35 mg/dL — ABNORMAL LOW (ref 0.44–1.00)
GFR, Estimated: >60 mL/min (ref >60)
Glucose, Bld: 75 mg/dL (ref 70–99)
Potassium: 3.3 mmol/L — ABNORMAL LOW (ref 3.5–5.1)
Sodium: 136 mmol/L (ref 135–145)

## 2024-01-30 LAB — CULTURE, BLOOD (ROUTINE X 2): Culture: NO GROWTH

## 2024-01-30 MED ORDER — POTASSIUM CHLORIDE CRYS ER 20 MEQ PO TBCR
40.0000 meq | EXTENDED_RELEASE_TABLET | Freq: Once | ORAL | Status: AC
  Administered 2024-01-30: 40 meq via ORAL
  Filled 2024-01-30: qty 2

## 2024-01-30 MED ORDER — PREDNISONE 5 MG PO TABS
10.0000 mg | ORAL_TABLET | Freq: Every day | ORAL | Status: DC
  Administered 2024-01-31: 10 mg via ORAL
  Filled 2024-01-30: qty 2

## 2024-01-30 MED ORDER — PREDNISONE 5 MG PO TABS
6.0000 mg | ORAL_TABLET | Freq: Once | ORAL | Status: AC
  Administered 2024-01-30: 6 mg via ORAL
  Filled 2024-01-30: qty 1

## 2024-01-30 NOTE — Progress Notes (Signed)
 PROGRESS NOTE  Daisy [name] KGM:[id number] DOB: March 18, 1951 DOA: 01/24/2024 PCP: Daisy Floro, MD   LOS: 6 days   Brief Narrative / Interim history: 73 y.o. F with MS in remission, hypopituitarism on chronic steroids, chronic pain on daily morphine IR, scoliosis with spinal stimulator, anxiety and esophageal stricture s/p dilation who presented with painful cough, found to have pneumonia.   Subjective / 24h Interval events: She is feeling extremely weak, especially when standing up.  Continues to have a productive cough  Assesement and Plan: Principal Problem:   Sepsis (HCC) Active Problems:   Addison's disease (HCC)   Hypothyroidism   Spinal cord stimulator dysfunction (HCC)   Principal problem Sepsis physiology due to multifocal pneumonia, acute hypoxic respiratory failure  - Presented with tachypnea, respiratory distress, fever, and hypoxia requiring supplemental oxygen by nonrebreather. -Imaging showed pneumonia, she was started on antibiotics, would favor to continue for total of 7 days -Remains congested, repeat chest x-ray today. -She is okay at rest on room air, but desats with activity.   Active problems  Hypopituitarism, hypothyroidism -continue levothyroxine, continue prednisone.  Increase the dose of prednisone due to persistent weakness as she may require while having an acute illness -Has dealt with significant side effects from high-dose steroids, will try 10 mg of prednisone   Chronic pain syndrome - Continue home morphine   Hypertension -blood pressure soft initially, now improving, but continue to hold losartan, Norvasc, spironolactone and closely monitor.   Multiple sclerosis - noted   GERD - Continue pantoprazole  Scheduled Meds:  azithromycin  250 mg Oral Daily   cefadroxil  500 mg Oral BID   enoxaparin (LOVENOX) injection  40 mg Subcutaneous Q24H   levothyroxine  50 mcg Oral QODAY   levothyroxine  75 mcg Oral QODAY   morphine  30 mg Oral 4  times per day   pantoprazole  20 mg Oral Daily   potassium chloride  40 mEq Oral Once   predniSONE  4 mg Oral Q breakfast   Continuous Infusions:  sodium chloride     PRN Meds:.acetaminophen **OR** acetaminophen, aspirin EC, bisacodyl, melatonin, ondansetron (ZOFRAN) IV, ondansetron, mouth rinse  Current Outpatient Medications  Medication Instructions   amLODipine (NORVASC) 2.5 mg, Oral, See admin instructions, Take 2.5 mg by mouth in the morning and an additional 2.5 mg if the systolic number is 160 or greater   aspirin EC 325 mg, Oral, 2 times daily PRN   cyanocobalamin (VITAMIN B12) 1,000 mcg, Intramuscular, Every 30 days   levothyroxine (SYNTHROID) 50 mcg, See admin instructions   levothyroxine (SYNTHROID) 75 mcg, See admin instructions   losartan (COZAAR) 100 mg, Every morning   morphine (MSIR) 30 mg, See admin instructions   nystatin powder 1 Application, Topical, 3 times daily PRN   ondansetron (ZOFRAN ODT) 8 mg, Oral, Every 8 hours PRN   ondansetron (ZOFRAN-ODT) 4-8 mg, Oral, Every 8 hours PRN   pantoprazole (PROTONIX) 40 mg, Daily before breakfast   predniSONE (DELTASONE) 4 mg, Daily with breakfast   senna-docusate (SENOKOT-S) 8.6-50 MG tablet 1 tablet, Oral, 2 times daily   valACYclovir (VALTREX) 1,000 mg, 3 times daily PRN   VIACTIV CALCIUM PLUS D 650-12.5-40 MG-MCG CHEW 1-2 tablets, Oral, Daily    Diet Orders (From admission, onward)     Start     Ordered   01/24/24 1919  Diet regular Room service appropriate? Yes; Fluid consistency: Thin  Diet effective now       Question Answer Comment  Room  service appropriate? Yes   Fluid consistency: Thin      01/24/24 1919            DVT prophylaxis: enoxaparin (LOVENOX) injection 40 mg Start: 01/24/24 2200   Lab Results  Component Value Date   PLT 155 01/30/2024      Code Status: Full Code  Family Communication: Husband at bedside  Status is: Inpatient Inpatient due to acute illness  Level of care:  Med-Surg  Consultants:  none  Objective: Vitals:   01/30/24 1045 01/30/24 1050 01/30/24 1100 01/30/24 1312  BP: (!) 159/85 (!) 141/87  (!) 165/81  Pulse:    83  Resp:    16  Temp:    98.9 F (37.2 C)  TempSrc:    Oral  SpO2:  (!) 86% 93% 93%  Weight:      Height:        Intake/Output Summary (Last 24 hours) at 01/30/2024 1332 Last data filed at 01/30/2024 6213 Gross per 24 hour  Intake 120 ml  Output --  Net 120 ml   Wt Readings from Last 3 Encounters:  01/24/24 52.2 kg  02/07/21 58.1 kg  04/23/20 59.4 kg    Examination:  Constitutional: NAD Eyes: no scleral icterus ENMT: Mucous membranes are moist.  Neck: normal, supple Respiratory: Coarse sounds bilaterally, bibasilar rhonchi.  End expiratory wheezing Cardiovascular: Regular rate and rhythm, no murmurs / rubs / gallops. No LE edema.  Abdomen: non distended, no tenderness. Bowel sounds positive.  Musculoskeletal: no clubbing / cyanosis.   Data Reviewed: I have independently reviewed following labs and imaging studies   CBC Recent Labs  Lab 01/24/24 1309 01/24/24 1345 01/25/24 0337 01/29/24 1404 01/30/24 0520  WBC 9.8  --  7.0 5.6 5.4  HGB 14.2 14.6 12.5 11.5* 10.7*  HCT 43.0 43.0 39.7 36.9 33.1*  PLT 233  --  155 152 155  MCV 88.5  --  90.2 89.8 88.3  MCH 29.2  --  28.4 28.0 28.5  MCHC 33.0  --  31.5 31.2 32.3  RDW 14.4  --  14.7 14.6 14.3  LYMPHSABS 0.4*  --   --   --   --   MONOABS 0.3  --   --   --   --   EOSABS 0.0  --   --   --   --   BASOSABS 0.0  --   --   --   --     Recent Labs  Lab 01/24/24 1309 01/24/24 1345 01/24/24 1951 01/25/24 0337 01/27/24 0535 01/29/24 1404 01/30/24 0520  NA 135 138  --  135 134* 136 136  K 3.4* 3.6  --  3.7 4.0 3.5 3.3*  CL 101 100  --  98 104 100 99  CO2 22  --   --  22 22 27 25   GLUCOSE 94 99  --  60* 63* 107* 75  BUN 22 26*  --  23 17 8 8   CREATININE 0.61 0.70  --  0.54 0.58 0.45 0.35*  CALCIUM 8.1*  --   --  7.3* 7.7* 8.0* 8.0*  AST 43*  --   --    --   --  39  --   ALT 21  --   --   --   --  31  --   ALKPHOS 42  --   --   --   --  53  --   BILITOT 0.6  --   --   --   --  0.6  --   ALBUMIN 3.4*  --   --   --   --  2.6*  --   MG  --   --   --  1.4* 2.5*  --   --   LATICACIDVEN  --  2.0* 1.7  --   --   --   --   INR 1.0  --   --   --   --   --   --   TSH  --   --   --  0.262*  --   --   --   BNP 122.1*  --   --   --   --   --   --     ------------------------------------------------------------------------------------------------------------------ No results for input(s): "CHOL", "HDL", "LDLCALC", "TRIG", "CHOLHDL", "LDLDIRECT" in the last 72 hours.  Lab Results  Component Value Date   HGBA1C 6.1 (H) 10/17/2012   ------------------------------------------------------------------------------------------------------------------ No results for input(s): "TSH", "T4TOTAL", "T3FREE", "THYROIDAB" in the last 72 hours.  Invalid input(s): "FREET3"  Cardiac Enzymes No results for input(s): "CKMB", "TROPONINI", "MYOGLOBIN" in the last 168 hours.  Invalid input(s): "CK" ------------------------------------------------------------------------------------------------------------------    Component Value Date/Time   BNP 122.1 (H) 01/24/2024 1309    CBG: No results for input(s): "GLUCAP" in the last 168 hours.  Recent Results (from the past 240 hours)  Blood Culture (routine x 2)     Status: None   Collection Time: 01/24/24  1:09 PM   Specimen: BLOOD  Result Value Ref Range Status   Specimen Description   Final    BLOOD RIGHT ANTECUBITAL Performed at [facility], [address], [place], [place] [address]    Special Requests   Final    BOTTLES DRAWN AEROBIC AND ANAEROBIC Blood Culture results may not be optimal due to an inadequate volume of blood received in culture bottles Performed at [facility], [address], [place], [place] [address]    Culture   Final    NO GROWTH 5  DAYS Performed at [facility], [address] [place], [place] [address]    Report Status 01/29/2024 FINAL  Final  Resp panel by RT-PCR (RSV, Flu A&B, Covid) Anterior Nasal Swab     Status: None   Collection Time: 01/24/24  1:40 PM   Specimen: Anterior Nasal Swab  Result Value Ref Range Status   SARS Coronavirus 2 by RT PCR NEGATIVE NEGATIVE Final    Comment: (NOTE) SARS-CoV-2 target nucleic acids are NOT DETECTED.  The SARS-CoV-2 RNA is generally detectable in upper respiratory specimens during the acute phase of infection. The lowest concentration of SARS-CoV-2 viral copies this assay can detect is 138 copies/mL. A negative result does not preclude SARS-Cov-2 infection and should not be used as the sole basis for treatment or other patient management decisions. A negative result may occur with  improper specimen collection/handling, submission of specimen other than nasopharyngeal swab, presence of viral mutation(s) within the areas targeted by this assay, and inadequate number of viral copies(<138 copies/mL). A negative result must be combined with clinical observations, patient history, and epidemiological information. The expected result is Negative.  Fact Sheet for Patients:  BloggerCourse.com  Fact Sheet for Healthcare Providers:  SeriousBroker.it  This test is no t yet approved or cleared by the [place] FDA and  has been authorized for detection and/or diagnosis of SARS-CoV-2 by FDA under an Emergency Use Authorization (EUA). This EUA will remain  in effect (meaning this test  can be used) for the duration of the COVID-19 declaration under Section 564(b)(1) of the Act, 21 U.S.C.section 360bbb-3(b)(1), unless the authorization is terminated  or revoked sooner.       Influenza A by PCR NEGATIVE NEGATIVE Final   Influenza B by PCR NEGATIVE NEGATIVE Final    Comment: (NOTE) The Xpert Xpress  SARS-CoV-2/FLU/RSV plus assay is intended as an aid in the diagnosis of influenza from Nasopharyngeal swab specimens and should not be used as a sole basis for treatment. Nasal washings and aspirates are unacceptable for Xpert Xpress SARS-CoV-2/FLU/RSV testing.  Fact Sheet for Patients: BloggerCourse.com  Fact Sheet for Healthcare Providers: SeriousBroker.it  This test is not yet approved or cleared by the [place] FDA and has been authorized for detection and/or diagnosis of SARS-CoV-2 by FDA under an Emergency Use Authorization (EUA). This EUA will remain in effect (meaning this test can be used) for the duration of the COVID-19 declaration under Section 564(b)(1) of the Act, 21 U.S.C. section 360bbb-3(b)(1), unless the authorization is terminated or revoked.     Resp Syncytial Virus by PCR NEGATIVE NEGATIVE Final    Comment: (NOTE) Fact Sheet for Patients: BloggerCourse.com  Fact Sheet for Healthcare Providers: SeriousBroker.it  This test is not yet approved or cleared by the [place] FDA and has been authorized for detection and/or diagnosis of SARS-CoV-2 by FDA under an Emergency Use Authorization (EUA). This EUA will remain in effect (meaning this test can be used) for the duration of the COVID-19 declaration under Section 564(b)(1) of the Act, 21 U.S.C. section 360bbb-3(b)(1), unless the authorization is terminated or revoked.  Performed at [facility], [address], [place], [place] [address]   MRSA Next Gen by PCR, Nasal     Status: None   Collection Time: 01/24/24 11:41 PM   Specimen: Nasal Mucosa; Nasal Swab  Result Value Ref Range Status   MRSA by PCR Next Gen NOT DETECTED NOT DETECTED Final    Comment: (NOTE) The GeneXpert MRSA Assay (FDA approved for NASAL specimens only), is one component of a comprehensive MRSA colonization  surveillance program. It is not intended to diagnose MRSA infection nor to guide or monitor treatment for MRSA infections. Test performance is not FDA approved in patients less than 30 years old. Performed at [facility], [address], [place], [place] [address]   Blood Culture (routine x 2)     Status: None   Collection Time: 01/25/24  3:37 AM   Specimen: BLOOD  Result Value Ref Range Status   Specimen Description   Final    BLOOD BLOOD RIGHT HAND Performed at [facility], [address], [place], [place] [address]    Special Requests   Final    BOTTLES DRAWN AEROBIC ONLY Blood Culture results may not be optimal due to an inadequate volume of blood received in culture bottles Performed at [facility], [address], [place], [place] [address]    Culture   Final    NO GROWTH 5 DAYS Performed at [facility], [address], [place], [place] [address]    Report Status 01/30/2024 FINAL  Final     Radiology Studies: No results found.   Pamella Pert, MD, PhD [facility]  Between 7 am - 7 pm I am available, please contact me via Amion (for emergencies) or Securechat (non urgent messages)  Between 7 pm - 7 am I am not available, please contact night coverage MD/APP  via [employer]

## 2024-01-31 DIAGNOSIS — A419 Sepsis, unspecified organism: Secondary | ICD-10-CM | POA: Diagnosis not present

## 2024-01-31 LAB — BASIC METABOLIC PANEL
Anion gap: 9 (ref 5–15)
BUN: 10 mg/dL (ref 8–23)
CO2: 25 mmol/L (ref 22–32)
Calcium: 8 mg/dL — ABNORMAL LOW (ref 8.9–10.3)
Chloride: 98 mmol/L (ref 98–111)
Creatinine, Ser: 0.45 mg/dL (ref 0.44–1.00)
GFR, Estimated: >60 mL/min (ref >60)
Glucose, Bld: 100 mg/dL — ABNORMAL HIGH (ref 70–99)
Potassium: 3.7 mmol/L (ref 3.5–5.1)
Sodium: 132 mmol/L — ABNORMAL LOW (ref 135–145)

## 2024-01-31 MED ORDER — PREDNISONE 10 MG PO TABS: 10.0000 mg | ORAL_TABLET | Freq: Every day | ORAL | 0 refills | Status: AC

## 2024-01-31 MED ORDER — ALBUTEROL SULFATE HFA 108 (90 BASE) MCG/ACT IN AERS: 2.0000 | INHALATION_SPRAY | Freq: Four times a day (QID) | RESPIRATORY_TRACT | 0 refills | Status: DC | PRN

## 2024-01-31 MED ORDER — CEFADROXIL 500 MG PO CAPS: 500.0000 mg | ORAL_CAPSULE | Freq: Two times a day (BID) | ORAL | 0 refills | Status: AC

## 2024-01-31 NOTE — Discharge Instructions (Addendum)
 Follow with Daisy Floro, MD in 5-7 days  Please get a complete blood count and chemistry panel checked by your Primary MD at your next visit, and again as instructed by your Primary MD. Please get your medications reviewed and adjusted by your Primary MD.  Please request your Primary MD to go over all Hospital Tests and Procedure/Radiological results at the follow up, please get all Hospital records sent to your Prim MD by signing hospital release before you go home.  In some cases, there will be blood work, cultures and biopsy results pending at the time of your discharge. Please request that your primary care M.D. goes through all the records of your hospital data and follows up on these results.  If you had Pneumonia of Lung problems at the Hospital: Please get a 2 view Chest X ray done in 6-8 weeks after hospital discharge or sooner if instructed by your Primary MD.  Get Medicines reviewed and adjusted. Please take all your medications with you for your next visit with your Primary MD  Please request your Primary MD to go over all hospital tests and procedure/radiological results at the follow up, please ask your Primary MD to get all Hospital records sent to his/her office.  If you experience worsening of your admission symptoms, develop shortness of breath, life threatening emergency, suicidal or homicidal thoughts you must seek medical attention immediately by calling 911 or calling your MD immediately  if symptoms less severe.  You must read complete instructions/literature along with all the possible adverse reactions/side effects for all the Medicines you take and that have been prescribed to you. Take any new Medicines after you have completely understood and accpet all the possible adverse reactions/side effects.   Do not drive or operate heavy machinery when taking Pain medications.   Do not take more than prescribed Pain, Sleep and Anxiety Medications  Special  Instructions: If you have smoked or chewed Tobacco  in the last 2 yrs please stop smoking, stop any regular Alcohol  and or any Recreational drug use.  Wear Seat belts while driving.  Please note You were cared for by a hospitalist during your hospital stay. If you have any questions about your discharge medications or the care you received while you were in the hospital after you are discharged, you can call the unit and asked to speak with the hospitalist on call if the hospitalist that took care of you is not available. Once you are discharged, your primary care physician will handle any further medical issues. Please note that NO REFILLS for any discharge medications will be authorized once you are discharged, as it is imperative that you return to your primary care physician (or establish a relationship with a primary care physician if you do not have one) for your aftercare needs so that they can reassess your need for medications and monitor your lab values.  You can reach the hospitalist office at phone [phone] or fax [phone]   If you do not have a primary care physician, you can call [phone] for a physician referral.  Activity: As tolerated with Full fall precautions use walker/cane & assistance as needed    Diet: regular  Disposition Home

## 2024-01-31 NOTE — TOC CM/SW Note (Signed)
    Durable Medical Equipment  (From admission, onward)           Start     Ordered   01/31/24 1018  For home use only DME oxygen  Once       Question Answer Comment  Length of Need 6 Months   Mode or (Route) Nasal cannula   Liters per Minute 2   Frequency Continuous (stationary and portable oxygen unit needed)   Oxygen delivery system Gas      01/31/24 1017

## 2024-01-31 NOTE — TOC Transition Note (Signed)
 Transition of Care [facility]) - Discharge Note   Patient Details  Name: Daisy [name] MRN: [id number] Date of Birth: [birth year]  Transition of Care [facility]) CM/SW Contact:  Lanier Clam, RN Phone Number: 01/31/2024, 10:19 AM   Clinical Narrative:  Spoke to patient deferred to spouse(Robert) no preference for HHC/DME-[employer] rep Elnita Maxwell HHPT;[employer] rep [place] home 02-order, & sats noted-to deliver home 02 travel tank to rm prior d/c. No further CM needs.     Final next level of care: Home w Home Health Services Barriers to Discharge: No Barriers Identified   Patient Goals and CMS Choice Patient states their goals for this hospitalization and ongoing recovery are:: Home CMS Medicare.gov Compare Post Acute Care list provided to:: Patient Choice offered to / list presented to : Patient, Spouse [place] ownership interest in [employer]provided to:: Spouse    Discharge Placement                       Discharge Plan and Services Additional resources added to the After Visit Summary for     Discharge Planning Services: CM Consult            DME Arranged: Oxygen DME Agency: [employer] Date DME Agency Contacted: 01/31/24 Time DME Agency Contacted: [phone]   HH Arranged: PT HH Agency: [facility] Date HH Agency Contacted: 01/31/24 Time HH Agency Contacted: 1018 Representative spoke with at [facility] Agency: Elnita Maxwell  Social Drivers of Health (SDOH) Interventions SDOH Screenings   Food Insecurity: No Food Insecurity (01/25/2024)  Housing: Low Risk  (01/25/2024)  Transportation Needs: No Transportation Needs (01/25/2024)  Utilities: Not At Risk (01/25/2024)  Social Connections: Moderately Integrated (01/25/2024)  Tobacco Use: Low Risk  (01/24/2024)     Readmission Risk Interventions    01/25/2024   11:51 AM  Readmission Risk Prevention Plan  Transportation Screening Complete  PCP or Specialist Appt within 5-7 Days Complete  Home Care  Screening Complete  Medication Review (RN CM) Complete

## 2024-01-31 NOTE — Progress Notes (Signed)
 SATURATION QUALIFICATIONS: (This note is used to comply with regulatory documentation for home oxygen)  Patient Saturations on Room Air at Rest = 94%  Patient Saturations on Room Air while Ambulating = 86%  Patient Saturations on 2 Liters of oxygen while Ambulating = 93%  Please briefly explain why patient needs home oxygen:O2 saturations drop to 86% while ambulating on RA. Saturations increase to 93% while on 2L O2

## 2024-01-31 NOTE — Discharge Summary (Signed)
 Physician Discharge Summary  Daisy [name]:[id number] DOB: 06/08/1951 DOA: 01/24/2024  PCP: Daisy Floro, MD  Admit date: 01/24/2024 Discharge date: 01/31/2024  Admitted From: home Disposition:  home  Recommendations for Outpatient Follow-up:  Follow up with PCP in 1-2 weeks  Home Health: none Equipment/Devices: none  Discharge Condition: stable CODE STATUS: Full code Diet Orders (From admission, onward)     Start     Ordered   01/24/24 1919  Diet regular Room service appropriate? Yes; Fluid consistency: Thin  Diet effective now       Question Answer Comment  Room service appropriate? Yes   Fluid consistency: Thin      01/24/24 1919            HPI: Per admitting MD, MIMA [name] is a 73 y.o. female with medical history significant for breast cancer x 3 but cancer free now for 9 years.  She also has a history of Addison's disease on daily prednisone, obstructive sleep apnea but not on CPAP, and multiple sclerosis.  Despite this the patient is functional and independent and rarely has to be admitted to the hospital.  2 days ago she started feeling very dizzy.  She also had no appetite and a little diarrhea.  Then she developed a terrible dry painful cough.  She says her whole chest hurts when she coughs.  Today she got progressively more dizzy so much so that her husband had to help her to the bathroom which is not her baseline.  She came in for evaluation.  Her temperature at home was 103.  Her emergency department workup included a CT scan of her chest which revealed diffuse changes consistent with bronchiolitis.  With her temperature being elevated and her heart rate being in the 140s as well as an elevated respiratory rate and elevated lactic acid,  the patient did meet sepsis criteria.  At the time of my initial evaluation the patient's heart rate was in the 140s and her respiratory rate was in the 40s.  Was on 100% nonrebreather and her O2 sats were 96 to 97%.   She had already received sepsis dose fluids and IV vancomycin and cefepime.  BiPAP was ordered but the patient's vitals did improve and her respiratory rate went down to the 20s.  Her VBG was good so we will defer BiPAP at this time but the patient will be admitted to a stepdown bed for close monitoring.  Her blood pressure has remained good during her ED stay.  She is COVID, influenza, and RSV negative.   Hospital Course / Discharge diagnoses: Principal Problem:   Sepsis (HCC) Active Problems:   Addison's disease (HCC)   Hypothyroidism   Spinal cord stimulator dysfunction (HCC)   Principal problem Sepsis physiology due to multifocal pneumonia, acute hypoxic respiratory failure  - Presented with tachypnea, respiratory distress, fever, and hypoxia requiring supplemental oxygen by nonrebreather. Imaging showed pneumonia, she was started on antibiotics, and overall her respiratory status has improved.  She is now comfortable at rest on room air, however still requiring 2 L with elation.  Anticipate she needs that on discharge for a limited period of time.  She has scoliosis and likely a degree of restrictive lung disease which I suspect contributes as well.  In addition, family mentioned that her oxygen level does run in the low 90s normally.  She will be transition to oral antibiotics to complete a weeks worth of treatment, and will be discharged home in stable  condition   Active problems  Hypopituitarism, hypothyroidism -continue home regimen with levothyroxine, prednisone, and the steroid dose was increased for a few days in the setting of acute illness  Chronic pain syndrome - Continue home morphine Hypertension -blood pressure soft initially, but now on the high side, resume home medications  Multiple sclerosis - noted GERD - Continue pantoprazole   Discharge Instructions   Allergies as of 01/31/2024       Reactions   Alendronate Other (See Comments)   ESOPHAGEAL IRRITATION DANGER OF  ESOPHAGEAL PERF.   Gabapentin Anaphylaxis   Iodinated Contrast Media Anaphylaxis, Other (See Comments)   Fever and blood clots, also ANAPHYLACTIC REACTION!-kmr-10/16/05   Iodine-131 Anaphylaxis   Iohexol Anaphylaxis, Other (See Comments)   ANAPHYLACTIC REACTION!-kmr-10/16/05   Procaine Anaphylaxis, Rash, Other (See Comments)   Novocaine caused shaking, rash, chest heaviness, made patient feel sick    Sulfa Antibiotics Swelling, Other (See Comments)   SWELLING REACTION UNSPECIFIED  HALLUCINATIONS   Terbinafine Hcl Anaphylaxis, Other (See Comments)   THROAT CLOSED   Amoxicillin-pot Clavulanate Diarrhea, Other (See Comments)   Bad headaches (tolerates PCN, per husband) Has patient had a PCN reaction causing immediate rash, facial/tongue/throat swelling, SOB or lightheadedness with hypotension: no Has patient had a PCN reaction causing severe rash involving mucus membranes or skin necrosis: no Has patient had a PCN reaction that required hospitalization no Has patient had a PCN reaction occurring within the last 10 years: no If all of the above answers are "NO", then may proceed with Cephalosporin use.   Clarithromycin Anxiety, Other (See Comments)   Caused the patient to be jittery   Clindamycin/lincomycin Other (See Comments)   Reaction not recalled   Compazine Anxiety, Other (See Comments)   Hyper and shaky   Dilaudid [hydromorphone Hcl] Nausea And Vomiting, Other (See Comments)   HEADACHES   Guaifenesin Nausea And Vomiting, Swelling, Anxiety, Other (See Comments)   Headaches and hyperactivity (SWELLING REACTION UNSPECIFIED)   Imiquimod Other (See Comments)   "Active and sick "   Lisinopril Cough   Methocarbamol Other (See Comments)   "Makes pt feel irritable "   Metoclopramide Other (See Comments)   "Hyper and shaky"   Other Hives, Rash, Other (See Comments)   SOME TAPES CAUSE SKIN IRRITATION   Prochlorperazine Anxiety, Other (See Comments)   "Hyper and shaky"    Promethazine Anxiety, Other (See Comments)   DIZZINESS, also   Sucralfate Nausea Only, Other (See Comments)   Distends stomach and hyperactivity   Sulfamethoxazole-trimethoprim Other (See Comments)   ALTERED MENTAL STATUS HALLUCINATIONS    Trifluoperazine Other (See Comments)   Hallucinations   Zolpidem Nausea And Vomiting, Other (See Comments)   Caused the patient to be shaky, too   Buspar [buspirone] Other (See Comments)   Changed mental status in a negative manner   Clonazepam Other (See Comments)   Changed mental status in a negative manner   Metformin Other (See Comments)   Headaches and migraines        Medication List     PAUSE taking these medications    predniSONE 1 MG tablet Wait to take this until: February 04, 2024 Commonly known as: DELTASONE Take 4 mg by mouth daily with breakfast. You also have another medication with the same name that you may need to continue taking.       TAKE these medications    albuterol 108 (90 Base) MCG/ACT inhaler Commonly known as: VENTOLIN HFA Inhale 2 puffs into the lungs every  6 (six) hours as needed for wheezing or shortness of breath.   amLODipine 2.5 MG tablet Commonly known as: NORVASC Take 2.5 mg by mouth See admin instructions. Take 2.5 mg by mouth in the morning and an additional 2.5 mg if the systolic number is 160 or greater   aspirin EC 325 MG tablet Take 325 mg by mouth 2 (two) times daily as needed (for headaches).   cefadroxil 500 MG capsule Commonly known as: DURICEF Take 1 capsule (500 mg total) by mouth 2 (two) times daily for 2 days.   cyanocobalamin 1000 MCG/ML injection Commonly known as: VITAMIN B12 Inject 1,000 mcg into the muscle every 30 (thirty) days.   levothyroxine 50 MCG tablet Commonly known as: SYNTHROID Take 50 mcg by mouth See admin instructions. Take 50 mcg by mouth 30 minutes before breakfast "on even-numbered days," (alternating with 75 mcg on "odd-numbered days")   levothyroxine 75  MCG tablet Commonly known as: SYNTHROID Take 75 mcg by mouth See admin instructions. Take 75 mcg by mouth 30 minutes before breakfast on "odd-numbered days," (alternating with 50 mcg on "even-numbered days")   losartan 100 MG tablet Commonly known as: COZAAR Take 100 mg by mouth in the morning.   morphine 30 MG tablet Commonly known as: MSIR Take 30 mg by mouth See admin instructions. Take 30 mg by mouth at 12 MIDNIGHT, 8 AM, 2 PM, and 8 PM   nystatin powder Apply 1 Application topically 3 (three) times daily as needed (for irritation).   ondansetron 4 MG disintegrating tablet Commonly known as: ZOFRAN-ODT Take 4-8 mg by mouth every 8 (eight) hours as needed for nausea or vomiting (DISSOLVE ORALLY).   ondansetron 8 MG disintegrating tablet Commonly known as: Zofran ODT Take 1 tablet (8 mg total) by mouth every 8 (eight) hours as needed for nausea or vomiting.   pantoprazole 40 MG tablet Commonly known as: PROTONIX Take 40 mg by mouth daily before breakfast.   predniSONE 10 MG tablet Commonly known as: DELTASONE Take 1 tablet (10 mg total) by mouth daily with breakfast for 3 days. Start taking on: February 01, 2024 What changed: Another medication with the same name was paused. Ask your nurse or doctor if you should take this medication.   senna-docusate 8.6-50 MG tablet Commonly known as: Senokot-S Take 1 tablet 2 (two) times daily by mouth. What changed: when to take this   valACYclovir 1000 MG tablet Commonly known as: VALTREX Take 1,000 mg by mouth 3 (three) times daily as needed (as directed for shingles outbreaks).   Viactiv Calcium Plus D 650-12.5-40 MG-MCG Chew Generic drug: Calcium-Vitamin D-Vitamin K Chew 1-2 tablets by mouth daily.               Durable Medical Equipment  (From admission, onward)           Start     Ordered   01/31/24 1018  For home use only DME oxygen  Once       Question Answer Comment  Length of Need 6 Months   Mode or  (Route) Nasal cannula   Liters per Minute 2   Frequency Continuous (stationary and portable oxygen unit needed)   Oxygen delivery system Gas      01/31/24 1017            Consultations: none  Procedures/Studies:  DG Chest 2 View Result Date: 01/30/2024 CLINICAL DATA:  Multifocal pneumonia EXAM: CHEST - 2 VIEW COMPARISON:  01/28/2024 FINDINGS: Frontal and lateral views of  the chest demonstrate stable cardiac silhouette. Stable areas of scarring at the left lung base are noted. No acute airspace disease, effusion, or pneumothorax. Stable S shaped scoliosis. Stable spinal stimulator, leads within the midthoracic central canal. IMPRESSION: 1. Stable lingular scarring.  No acute intrathoracic process. Electronically Signed   By: Sharlet Salina M.D.   On: 01/30/2024 19:44   DG Chest 2 View Result Date: 01/28/2024 CLINICAL DATA:  Hypoxia EXAM: CHEST - 2 VIEW COMPARISON:  CT chest dated 01/24/2024 FINDINGS: Patchy left lower lung opacity, likely reflecting scarring/radiation changes when correlating with prior CT. Right lung is clear. No pleural effusion or pneumothorax. The heart is normal in size. Cervical spine fixation hardware. S shaped thoracolumbar scoliosis. Thoracic spine stimulator. Surgical clips along the left chest wall/axilla. Cholecystectomy clips. IMPRESSION: Patchy left lower lung opacity, likely reflecting scarring/radiation changes when correlating with prior CT. Electronically Signed   By: Charline Bills M.D.   On: 01/28/2024 19:30   DG Chest Portable 1 View Result Date: 01/24/2024 CLINICAL DATA:  Respiratory distress. EXAM: PORTABLE CHEST 1 VIEW COMPARISON:  Radiograph and CT earlier today FINDINGS: Normal heart size with stable mediastinal contours. Majority of the tree-in-bud opacities on CT have no radiographic correlate. No progressive airspace disease, a developing pulmonary edema, pleural effusion or pneumothorax. Spinal stimulator in place. Scoliosis. IMPRESSION:  Majority of the tree-in-bud opacities on CT have no radiographic correlate. No progressive airspace disease. Electronically Signed   By: Narda Rutherford M.D.   On: 01/24/2024 18:30   CT Chest Wo Contrast Result Date: 01/24/2024 CLINICAL DATA:  Nausea vomiting diarrhea low oxygen saturation EXAM: CT CHEST WITHOUT CONTRAST TECHNIQUE: Multidetector CT imaging of the chest was performed following the standard protocol without IV contrast. RADIATION DOSE REDUCTION: This exam was performed according to the departmental dose-optimization program which includes automated exposure control, adjustment of the mA and/or kV according to patient size and/or use of iterative reconstruction technique. COMPARISON:  Chest x-ray 01/24/2024, CT chest 09/06/2019, 03/22/2015 FINDINGS: Cardiovascular: Limited evaluation without intravenous contrast. Nonaneurysmal aorta. Mild atherosclerosis. Normal cardiac size. No pericardial effusion Mediastinum/Nodes: Patent trachea. No thyroid mass. No suspicious lymph nodes. Mild air distension of the esophagus. Mild circumferential distal esophageal thickening and mild thickening at the GE junction. Small hiatal hernia. Lungs/Pleura: Subpleural reticulation in the left upper lobe with mild lingular bronchiectasis and consolidation likely due to scarring and post radiation change. Scattered bilateral central lobular ground-glass density and tree-in-bud density, most notable in the left lower lobe. No pleural effusion or pneumothorax Upper Abdomen: No acute finding. Small kidney stones. Cholecystectomy Musculoskeletal: Scoliosis and degenerative changes of the spine. Chronic compression fracture at L1. Ascending thoracic stimulator leads. Status post left mastectomy. IMPRESSION: 1. Scattered bilateral central lobular ground-glass density and tree-in-bud density, most notable in the left lower lobe, findings are most consistent with infectious or inflammatory bronchiolitis. 2. Subpleural  reticulation in the left upper lobe with mild lingular bronchiectasis and consolidation likely due to scarring and post radiation change. 3. Small hiatal hernia with mild circumferential distal esophageal thickening and mild thickening at the GE junction, findings could be due to esophagitis or reflux, consider correlation with endoscopy. 4. Aortic atherosclerosis. Aortic Atherosclerosis (ICD10-I70.0). Electronically Signed   By: Jasmine Pang M.D.   On: 01/24/2024 15:16   DG Chest Port 1 View Result Date: 01/24/2024 CLINICAL DATA:  Sepsis. EXAM: PORTABLE CHEST 1 VIEW COMPARISON:  February 13, 2023. FINDINGS: The heart size and mediastinal contours are within normal limits. Both lungs are clear. Scoliosis  of thoracic spine is noted. IMPRESSION: No active disease. Electronically Signed   By: Lupita Raider M.D.   On: 01/24/2024 13:16     Subjective: - no chest pain, shortness of breath, no abdominal pain, nausea or vomiting.   Discharge Exam: BP (!) 156/83 (BP Location: Left Arm)   Pulse 81   Temp 98.4 F (36.9 C) (Oral)   Resp 18   Ht 5' (1.524 m)   Wt 52.2 kg   SpO2 94%   BMI 22.46 kg/m   General: Pt is alert, awake, not in acute distress Cardiovascular: RRR, S1/S2 +, no rubs, no gallops Respiratory: CTA bilaterally, no wheezing, no rhonchi Abdominal: Soft, NT, ND, bowel sounds + Extremities: no edema, no cyanosis    The results of significant diagnostics from this hospitalization (including imaging, microbiology, ancillary and laboratory) are listed below for reference.     Microbiology: Recent Results (from the past 240 hours)  Blood Culture (routine x 2)     Status: None   Collection Time: 01/24/24  1:09 PM   Specimen: BLOOD  Result Value Ref Range Status   Specimen Description   Final    BLOOD RIGHT ANTECUBITAL Performed at [facility], [address] [place], [place] [address]    Special Requests   Final    BOTTLES DRAWN AEROBIC AND ANAEROBIC Blood  Culture results may not be optimal due to an inadequate volume of blood received in culture bottles Performed at [facility], [address], [place], [place] [address]    Culture   Final    NO GROWTH 5 DAYS Performed at [facility], [address], [place], [place] [address]    Report Status 01/29/2024 FINAL  Final  Resp panel by RT-PCR (RSV, Flu A&B, Covid) Anterior Nasal Swab     Status: None   Collection Time: 01/24/24  1:40 PM   Specimen: Anterior Nasal Swab  Result Value Ref Range Status   SARS Coronavirus 2 by RT PCR NEGATIVE NEGATIVE Final    Comment: (NOTE) SARS-CoV-2 target nucleic acids are NOT DETECTED.  The SARS-CoV-2 RNA is generally detectable in upper respiratory specimens during the acute phase of infection. The lowest concentration of SARS-CoV-2 viral copies this assay can detect is 138 copies/mL. A negative result does not preclude SARS-Cov-2 infection and should not be used as the sole basis for treatment or other patient management decisions. A negative result may occur with  improper specimen collection/handling, submission of specimen other than nasopharyngeal swab, presence of viral mutation(s) within the areas targeted by this assay, and inadequate number of viral copies(<138 copies/mL). A negative result must be combined with clinical observations, patient history, and epidemiological information. The expected result is Negative.  Fact Sheet for Patients:  BloggerCourse.com  Fact Sheet for Healthcare Providers:  SeriousBroker.it  This test is no t yet approved or cleared by the [place] FDA and  has been authorized for detection and/or diagnosis of SARS-CoV-2 by FDA under an Emergency Use Authorization (EUA). This EUA will remain  in effect (meaning this test can be used) for the duration of the COVID-19 declaration under Section 564(b)(1) of the Act, 21 U.S.C.section  360bbb-3(b)(1), unless the authorization is terminated  or revoked sooner.       Influenza A by PCR NEGATIVE NEGATIVE Final   Influenza B by PCR NEGATIVE NEGATIVE Final    Comment: (NOTE) The Xpert Xpress SARS-CoV-2/FLU/RSV plus assay is intended as an aid in the diagnosis  of influenza from Nasopharyngeal swab specimens and should not be used as a sole basis for treatment. Nasal washings and aspirates are unacceptable for Xpert Xpress SARS-CoV-2/FLU/RSV testing.  Fact Sheet for Patients: BloggerCourse.com  Fact Sheet for Healthcare Providers: SeriousBroker.it  This test is not yet approved or cleared by the [place] FDA and has been authorized for detection and/or diagnosis of SARS-CoV-2 by FDA under an Emergency Use Authorization (EUA). This EUA will remain in effect (meaning this test can be used) for the duration of the COVID-19 declaration under Section 564(b)(1) of the Act, 21 U.S.C. section 360bbb-3(b)(1), unless the authorization is terminated or revoked.     Resp Syncytial Virus by PCR NEGATIVE NEGATIVE Final    Comment: (NOTE) Fact Sheet for Patients: BloggerCourse.com  Fact Sheet for Healthcare Providers: SeriousBroker.it  This test is not yet approved or cleared by the [place] FDA and has been authorized for detection and/or diagnosis of SARS-CoV-2 by FDA under an Emergency Use Authorization (EUA). This EUA will remain in effect (meaning this test can be used) for the duration of the COVID-19 declaration under Section 564(b)(1) of the Act, 21 U.S.C. section 360bbb-3(b)(1), unless the authorization is terminated or revoked.  Performed at [facility], [address], [place], [place] [address]   MRSA Next Gen by PCR, Nasal     Status: None   Collection Time: 01/24/24 11:41 PM   Specimen: Nasal Mucosa; Nasal Swab  Result Value Ref  Range Status   MRSA by PCR Next Gen NOT DETECTED NOT DETECTED Final    Comment: (NOTE) The GeneXpert MRSA Assay (FDA approved for NASAL specimens only), is one component of a comprehensive MRSA colonization surveillance program. It is not intended to diagnose MRSA infection nor to guide or monitor treatment for MRSA infections. Test performance is not FDA approved in patients less than 79 years old. Performed at [facility], [address] [place], [place] [address]   Blood Culture (routine x 2)     Status: None   Collection Time: 01/25/24  3:37 AM   Specimen: BLOOD  Result Value Ref Range Status   Specimen Description   Final    BLOOD BLOOD RIGHT HAND Performed at [facility], [address] [place], [place] [address]    Special Requests   Final    BOTTLES DRAWN AEROBIC ONLY Blood Culture results may not be optimal due to an inadequate volume of blood received in culture bottles Performed at [facility], [address], [place], [place] [address]    Culture   Final    NO GROWTH 5 DAYS Performed at [facility], [address] [place], [place] [address]    Report Status 01/30/2024 FINAL  Final     Labs: Basic Metabolic Panel: Recent Labs  Lab 01/25/24 0337 01/27/24 0535 01/29/24 1404 01/30/24 0520 01/31/24 0529  NA 135 134* 136 136 132*  K 3.7 4.0 3.5 3.3* 3.7  CL 98 104 100 99 98  CO2 22 22 27 25 25   GLUCOSE 60* 63* 107* 75 100*  BUN 23 17 8 8 10   CREATININE 0.54 0.58 0.45 0.35* 0.45  CALCIUM 7.3* 7.7* 8.0* 8.0* 8.0*  MG 1.4* 2.5*  --   --   --    Liver Function Tests: Recent Labs  Lab 01/24/24 1309 01/29/24 1404  AST 43* 39  ALT 21 31  ALKPHOS 42 53  BILITOT 0.6 0.6  PROT 7.0 6.1*  ALBUMIN 3.4* 2.6*   CBC: Recent Labs  Lab 01/24/24 1309 01/24/24 1345 01/25/24 0337 01/29/24 1404 01/30/24 0520  WBC 9.8  --  7.0 5.6 5.4  NEUTROABS 9.0*  --   --   --   --   HGB 14.2 14.6 12.5  11.5* 10.7*  HCT 43.0 43.0 39.7 36.9 33.1*  MCV 88.5  --  90.2 89.8 88.3  PLT 233  --  155 152 155   CBG: No results for input(s): "GLUCAP" in the last 168 hours. Hgb A1c No results for input(s): "HGBA1C" in the last 72 hours. Lipid Profile No results for input(s): "CHOL", "HDL", "LDLCALC", "TRIG", "CHOLHDL", "LDLDIRECT" in the last 72 hours. Thyroid function studies No results for input(s): "TSH", "T4TOTAL", "T3FREE", "THYROIDAB" in the last 72 hours.  Invalid input(s): "FREET3" Urinalysis    Component Value Date/Time   COLORURINE YELLOW 01/24/2024 1316   APPEARANCEUR CLEAR 01/24/2024 1316   LABSPEC 1.014 01/24/2024 1316   PHURINE 7.0 01/24/2024 1316   GLUCOSEU NEGATIVE 01/24/2024 1316   HGBUR NEGATIVE 01/24/2024 1316   BILIRUBINUR NEGATIVE 01/24/2024 1316   KETONESUR NEGATIVE 01/24/2024 1316   PROTEINUR NEGATIVE 01/24/2024 1316   UROBILINOGEN 1.0 05/17/2014 1753   NITRITE NEGATIVE 01/24/2024 1316   LEUKOCYTESUR NEGATIVE 01/24/2024 1316    FURTHER DISCHARGE INSTRUCTIONS:   Get Medicines reviewed and adjusted: Please take all your medications with you for your next visit with your Primary MD   Laboratory/radiological data: Please request your Primary MD to go over all hospital tests and procedure/radiological results at the follow up, please ask your Primary MD to get all Hospital records sent to his/her office.   In some cases, they will be blood work, cultures and biopsy results pending at the time of your discharge. Please request that your primary care M.D. goes through all the records of your hospital data and follows up on these results.   Also Note the following: If you experience worsening of your admission symptoms, develop shortness of breath, life threatening emergency, suicidal or homicidal thoughts you must seek medical attention immediately by calling 911 or calling your MD immediately  if symptoms less severe.   You must read complete  instructions/literature along with all the possible adverse reactions/side effects for all the Medicines you take and that have been prescribed to you. Take any new Medicines after you have completely understood and accpet all the possible adverse reactions/side effects.    Do not drive when taking Pain medications or sleeping medications (Benzodaizepines)   Do not take more than prescribed Pain, Sleep and Anxiety Medications. It is not advisable to combine anxiety,sleep and pain medications without talking with your primary care practitioner   Special Instructions: If you have smoked or chewed Tobacco  in the last 2 yrs please stop smoking, stop any regular Alcohol  and or any Recreational drug use.   Wear Seat belts while driving.   Please note: You were cared for by a hospitalist during your hospital stay. Once you are discharged, your primary care physician will handle any further medical issues. Please note that NO REFILLS for any discharge medications will be authorized once you are discharged, as it is imperative that you return to your primary care physician (or establish a relationship with a primary care physician if you do not have one) for your post hospital discharge needs so that they can reassess your need for medications and monitor your lab values.  Time coordinating discharge: 35 minutes  SIGNED:  Pamella Pert,  MD, PhD 01/31/2024, 10:18 AM

## 2024-02-01 DIAGNOSIS — Z87442 Personal history of urinary calculi: Secondary | ICD-10-CM | POA: Diagnosis not present

## 2024-02-01 DIAGNOSIS — J189 Pneumonia, unspecified organism: Secondary | ICD-10-CM | POA: Diagnosis not present

## 2024-02-01 DIAGNOSIS — Z9682 Presence of neurostimulator: Secondary | ICD-10-CM | POA: Diagnosis not present

## 2024-02-01 DIAGNOSIS — Z9981 Dependence on supplemental oxygen: Secondary | ICD-10-CM | POA: Diagnosis not present

## 2024-02-01 DIAGNOSIS — Z95828 Presence of other vascular implants and grafts: Secondary | ICD-10-CM | POA: Diagnosis not present

## 2024-02-01 DIAGNOSIS — E039 Hypothyroidism, unspecified: Secondary | ICD-10-CM | POA: Diagnosis not present

## 2024-02-01 DIAGNOSIS — J9601 Acute respiratory failure with hypoxia: Secondary | ICD-10-CM | POA: Diagnosis not present

## 2024-02-01 DIAGNOSIS — M51369 Other intervertebral disc degeneration, lumbar region without mention of lumbar back pain or lower extremity pain: Secondary | ICD-10-CM | POA: Diagnosis not present

## 2024-02-01 DIAGNOSIS — K219 Gastro-esophageal reflux disease without esophagitis: Secondary | ICD-10-CM | POA: Diagnosis not present

## 2024-02-01 DIAGNOSIS — E271 Primary adrenocortical insufficiency: Secondary | ICD-10-CM | POA: Diagnosis not present

## 2024-02-01 DIAGNOSIS — Z79891 Long term (current) use of opiate analgesic: Secondary | ICD-10-CM | POA: Diagnosis not present

## 2024-02-01 DIAGNOSIS — G35 Multiple sclerosis: Secondary | ICD-10-CM | POA: Diagnosis not present

## 2024-02-01 DIAGNOSIS — G894 Chronic pain syndrome: Secondary | ICD-10-CM | POA: Diagnosis not present

## 2024-02-01 DIAGNOSIS — D509 Iron deficiency anemia, unspecified: Secondary | ICD-10-CM | POA: Diagnosis not present

## 2024-02-01 DIAGNOSIS — K224 Dyskinesia of esophagus: Secondary | ICD-10-CM | POA: Diagnosis not present

## 2024-02-01 DIAGNOSIS — Z853 Personal history of malignant neoplasm of breast: Secondary | ICD-10-CM | POA: Diagnosis not present

## 2024-02-01 DIAGNOSIS — F411 Generalized anxiety disorder: Secondary | ICD-10-CM | POA: Diagnosis not present

## 2024-02-01 DIAGNOSIS — Z7982 Long term (current) use of aspirin: Secondary | ICD-10-CM | POA: Diagnosis not present

## 2024-02-01 DIAGNOSIS — G4733 Obstructive sleep apnea (adult) (pediatric): Secondary | ICD-10-CM | POA: Diagnosis not present

## 2024-02-01 DIAGNOSIS — S32010D Wedge compression fracture of first lumbar vertebra, subsequent encounter for fracture with routine healing: Secondary | ICD-10-CM | POA: Diagnosis not present

## 2024-02-01 DIAGNOSIS — Z7952 Long term (current) use of systemic steroids: Secondary | ICD-10-CM | POA: Diagnosis not present

## 2024-02-04 DIAGNOSIS — G894 Chronic pain syndrome: Secondary | ICD-10-CM | POA: Diagnosis not present

## 2024-02-04 DIAGNOSIS — S32010D Wedge compression fracture of first lumbar vertebra, subsequent encounter for fracture with routine healing: Secondary | ICD-10-CM | POA: Diagnosis not present

## 2024-02-04 DIAGNOSIS — M51369 Other intervertebral disc degeneration, lumbar region without mention of lumbar back pain or lower extremity pain: Secondary | ICD-10-CM | POA: Diagnosis not present

## 2024-02-04 DIAGNOSIS — J189 Pneumonia, unspecified organism: Secondary | ICD-10-CM | POA: Diagnosis not present

## 2024-02-04 DIAGNOSIS — J9601 Acute respiratory failure with hypoxia: Secondary | ICD-10-CM | POA: Diagnosis not present

## 2024-02-04 DIAGNOSIS — E271 Primary adrenocortical insufficiency: Secondary | ICD-10-CM | POA: Diagnosis not present

## 2024-02-05 DIAGNOSIS — Z87442 Personal history of urinary calculi: Secondary | ICD-10-CM | POA: Diagnosis not present

## 2024-02-05 DIAGNOSIS — I10 Essential (primary) hypertension: Secondary | ICD-10-CM | POA: Diagnosis not present

## 2024-02-05 DIAGNOSIS — K209 Esophagitis, unspecified without bleeding: Secondary | ICD-10-CM | POA: Diagnosis not present

## 2024-02-05 DIAGNOSIS — Z9012 Acquired absence of left breast and nipple: Secondary | ICD-10-CM | POA: Diagnosis not present

## 2024-02-05 DIAGNOSIS — Z79899 Other long term (current) drug therapy: Secondary | ICD-10-CM | POA: Diagnosis not present

## 2024-02-05 DIAGNOSIS — G35 Multiple sclerosis: Secondary | ICD-10-CM | POA: Diagnosis not present

## 2024-02-05 DIAGNOSIS — E271 Primary adrenocortical insufficiency: Secondary | ICD-10-CM | POA: Diagnosis not present

## 2024-02-05 DIAGNOSIS — K3189 Other diseases of stomach and duodenum: Secondary | ICD-10-CM | POA: Diagnosis not present

## 2024-02-05 DIAGNOSIS — Z79890 Hormone replacement therapy (postmenopausal): Secondary | ICD-10-CM | POA: Diagnosis not present

## 2024-02-05 DIAGNOSIS — E039 Hypothyroidism, unspecified: Secondary | ICD-10-CM | POA: Diagnosis not present

## 2024-02-05 DIAGNOSIS — Z9049 Acquired absence of other specified parts of digestive tract: Secondary | ICD-10-CM | POA: Diagnosis not present

## 2024-02-05 DIAGNOSIS — K222 Esophageal obstruction: Secondary | ICD-10-CM | POA: Diagnosis not present

## 2024-02-05 DIAGNOSIS — G8929 Other chronic pain: Secondary | ICD-10-CM | POA: Diagnosis not present

## 2024-02-05 DIAGNOSIS — Z7982 Long term (current) use of aspirin: Secondary | ICD-10-CM | POA: Diagnosis not present

## 2024-02-05 DIAGNOSIS — Z853 Personal history of malignant neoplasm of breast: Secondary | ICD-10-CM | POA: Diagnosis not present

## 2024-02-05 DIAGNOSIS — J45909 Unspecified asthma, uncomplicated: Secondary | ICD-10-CM | POA: Diagnosis not present

## 2024-02-05 DIAGNOSIS — Z90710 Acquired absence of both cervix and uterus: Secondary | ICD-10-CM | POA: Diagnosis not present

## 2024-02-05 DIAGNOSIS — M549 Dorsalgia, unspecified: Secondary | ICD-10-CM | POA: Diagnosis not present

## 2024-02-07 DIAGNOSIS — H6980 Other specified disorders of Eustachian tube, unspecified ear: Secondary | ICD-10-CM | POA: Diagnosis not present

## 2024-02-07 DIAGNOSIS — Z09 Encounter for follow-up examination after completed treatment for conditions other than malignant neoplasm: Secondary | ICD-10-CM | POA: Diagnosis not present

## 2024-02-07 DIAGNOSIS — J189 Pneumonia, unspecified organism: Secondary | ICD-10-CM | POA: Diagnosis not present

## 2024-02-07 DIAGNOSIS — A419 Sepsis, unspecified organism: Secondary | ICD-10-CM | POA: Diagnosis not present

## 2024-02-08 DIAGNOSIS — S32010D Wedge compression fracture of first lumbar vertebra, subsequent encounter for fracture with routine healing: Secondary | ICD-10-CM | POA: Diagnosis not present

## 2024-02-08 DIAGNOSIS — J9601 Acute respiratory failure with hypoxia: Secondary | ICD-10-CM | POA: Diagnosis not present

## 2024-02-08 DIAGNOSIS — M51369 Other intervertebral disc degeneration, lumbar region without mention of lumbar back pain or lower extremity pain: Secondary | ICD-10-CM | POA: Diagnosis not present

## 2024-02-08 DIAGNOSIS — G894 Chronic pain syndrome: Secondary | ICD-10-CM | POA: Diagnosis not present

## 2024-02-08 DIAGNOSIS — J189 Pneumonia, unspecified organism: Secondary | ICD-10-CM | POA: Diagnosis not present

## 2024-02-08 DIAGNOSIS — E271 Primary adrenocortical insufficiency: Secondary | ICD-10-CM | POA: Diagnosis not present

## 2024-02-11 DIAGNOSIS — J189 Pneumonia, unspecified organism: Secondary | ICD-10-CM | POA: Diagnosis not present

## 2024-02-11 DIAGNOSIS — G894 Chronic pain syndrome: Secondary | ICD-10-CM | POA: Diagnosis not present

## 2024-02-11 DIAGNOSIS — J9601 Acute respiratory failure with hypoxia: Secondary | ICD-10-CM | POA: Diagnosis not present

## 2024-02-11 DIAGNOSIS — E271 Primary adrenocortical insufficiency: Secondary | ICD-10-CM | POA: Diagnosis not present

## 2024-02-11 DIAGNOSIS — S32010D Wedge compression fracture of first lumbar vertebra, subsequent encounter for fracture with routine healing: Secondary | ICD-10-CM | POA: Diagnosis not present

## 2024-02-11 DIAGNOSIS — M51369 Other intervertebral disc degeneration, lumbar region without mention of lumbar back pain or lower extremity pain: Secondary | ICD-10-CM | POA: Diagnosis not present

## 2024-02-12 DIAGNOSIS — S32010D Wedge compression fracture of first lumbar vertebra, subsequent encounter for fracture with routine healing: Secondary | ICD-10-CM | POA: Diagnosis not present

## 2024-02-12 DIAGNOSIS — J189 Pneumonia, unspecified organism: Secondary | ICD-10-CM | POA: Diagnosis not present

## 2024-02-12 DIAGNOSIS — G894 Chronic pain syndrome: Secondary | ICD-10-CM | POA: Diagnosis not present

## 2024-02-12 DIAGNOSIS — J9601 Acute respiratory failure with hypoxia: Secondary | ICD-10-CM | POA: Diagnosis not present

## 2024-02-12 DIAGNOSIS — M51369 Other intervertebral disc degeneration, lumbar region without mention of lumbar back pain or lower extremity pain: Secondary | ICD-10-CM | POA: Diagnosis not present

## 2024-02-12 DIAGNOSIS — E271 Primary adrenocortical insufficiency: Secondary | ICD-10-CM | POA: Diagnosis not present

## 2024-02-13 DIAGNOSIS — M4726 Other spondylosis with radiculopathy, lumbar region: Secondary | ICD-10-CM | POA: Diagnosis not present

## 2024-02-13 DIAGNOSIS — M41115 Juvenile idiopathic scoliosis, thoracolumbar region: Secondary | ICD-10-CM | POA: Diagnosis not present

## 2024-02-13 DIAGNOSIS — Z79891 Long term (current) use of opiate analgesic: Secondary | ICD-10-CM | POA: Diagnosis not present

## 2024-02-13 DIAGNOSIS — G894 Chronic pain syndrome: Secondary | ICD-10-CM | POA: Diagnosis not present

## 2024-02-19 DIAGNOSIS — G894 Chronic pain syndrome: Secondary | ICD-10-CM | POA: Diagnosis not present

## 2024-02-19 DIAGNOSIS — J189 Pneumonia, unspecified organism: Secondary | ICD-10-CM | POA: Diagnosis not present

## 2024-02-19 DIAGNOSIS — E271 Primary adrenocortical insufficiency: Secondary | ICD-10-CM | POA: Diagnosis not present

## 2024-02-19 DIAGNOSIS — S32010D Wedge compression fracture of first lumbar vertebra, subsequent encounter for fracture with routine healing: Secondary | ICD-10-CM | POA: Diagnosis not present

## 2024-02-19 DIAGNOSIS — M51369 Other intervertebral disc degeneration, lumbar region without mention of lumbar back pain or lower extremity pain: Secondary | ICD-10-CM | POA: Diagnosis not present

## 2024-02-19 DIAGNOSIS — J9601 Acute respiratory failure with hypoxia: Secondary | ICD-10-CM | POA: Diagnosis not present

## 2024-02-20 DIAGNOSIS — G894 Chronic pain syndrome: Secondary | ICD-10-CM | POA: Diagnosis not present

## 2024-02-20 DIAGNOSIS — E271 Primary adrenocortical insufficiency: Secondary | ICD-10-CM | POA: Diagnosis not present

## 2024-02-20 DIAGNOSIS — S32010D Wedge compression fracture of first lumbar vertebra, subsequent encounter for fracture with routine healing: Secondary | ICD-10-CM | POA: Diagnosis not present

## 2024-02-20 DIAGNOSIS — J189 Pneumonia, unspecified organism: Secondary | ICD-10-CM | POA: Diagnosis not present

## 2024-02-20 DIAGNOSIS — J9601 Acute respiratory failure with hypoxia: Secondary | ICD-10-CM | POA: Diagnosis not present

## 2024-02-20 DIAGNOSIS — M51369 Other intervertebral disc degeneration, lumbar region without mention of lumbar back pain or lower extremity pain: Secondary | ICD-10-CM | POA: Diagnosis not present

## 2024-02-25 DIAGNOSIS — Z4542 Encounter for adjustment and management of neuropacemaker (brain) (peripheral nerve) (spinal cord): Secondary | ICD-10-CM | POA: Diagnosis not present

## 2024-02-25 DIAGNOSIS — T85192A Other mechanical complication of implanted electronic neurostimulator (electrode) of spinal cord, initial encounter: Secondary | ICD-10-CM | POA: Diagnosis not present

## 2024-02-27 DIAGNOSIS — G894 Chronic pain syndrome: Secondary | ICD-10-CM | POA: Diagnosis not present

## 2024-02-27 DIAGNOSIS — E271 Primary adrenocortical insufficiency: Secondary | ICD-10-CM | POA: Diagnosis not present

## 2024-02-27 DIAGNOSIS — J9601 Acute respiratory failure with hypoxia: Secondary | ICD-10-CM | POA: Diagnosis not present

## 2024-02-27 DIAGNOSIS — M51369 Other intervertebral disc degeneration, lumbar region without mention of lumbar back pain or lower extremity pain: Secondary | ICD-10-CM | POA: Diagnosis not present

## 2024-02-27 DIAGNOSIS — S32010D Wedge compression fracture of first lumbar vertebra, subsequent encounter for fracture with routine healing: Secondary | ICD-10-CM | POA: Diagnosis not present

## 2024-02-27 DIAGNOSIS — J189 Pneumonia, unspecified organism: Secondary | ICD-10-CM | POA: Diagnosis not present

## 2024-03-02 DIAGNOSIS — K219 Gastro-esophageal reflux disease without esophagitis: Secondary | ICD-10-CM | POA: Diagnosis not present

## 2024-03-02 DIAGNOSIS — Z87442 Personal history of urinary calculi: Secondary | ICD-10-CM | POA: Diagnosis not present

## 2024-03-02 DIAGNOSIS — G35 Multiple sclerosis: Secondary | ICD-10-CM | POA: Diagnosis not present

## 2024-03-02 DIAGNOSIS — Z95828 Presence of other vascular implants and grafts: Secondary | ICD-10-CM | POA: Diagnosis not present

## 2024-03-02 DIAGNOSIS — Z9682 Presence of neurostimulator: Secondary | ICD-10-CM | POA: Diagnosis not present

## 2024-03-02 DIAGNOSIS — Z9981 Dependence on supplemental oxygen: Secondary | ICD-10-CM | POA: Diagnosis not present

## 2024-03-02 DIAGNOSIS — Z79891 Long term (current) use of opiate analgesic: Secondary | ICD-10-CM | POA: Diagnosis not present

## 2024-03-02 DIAGNOSIS — D509 Iron deficiency anemia, unspecified: Secondary | ICD-10-CM | POA: Diagnosis not present

## 2024-03-02 DIAGNOSIS — S32010D Wedge compression fracture of first lumbar vertebra, subsequent encounter for fracture with routine healing: Secondary | ICD-10-CM | POA: Diagnosis not present

## 2024-03-02 DIAGNOSIS — Z7952 Long term (current) use of systemic steroids: Secondary | ICD-10-CM | POA: Diagnosis not present

## 2024-03-02 DIAGNOSIS — K224 Dyskinesia of esophagus: Secondary | ICD-10-CM | POA: Diagnosis not present

## 2024-03-02 DIAGNOSIS — J189 Pneumonia, unspecified organism: Secondary | ICD-10-CM | POA: Diagnosis not present

## 2024-03-02 DIAGNOSIS — M51369 Other intervertebral disc degeneration, lumbar region without mention of lumbar back pain or lower extremity pain: Secondary | ICD-10-CM | POA: Diagnosis not present

## 2024-03-02 DIAGNOSIS — Z853 Personal history of malignant neoplasm of breast: Secondary | ICD-10-CM | POA: Diagnosis not present

## 2024-03-02 DIAGNOSIS — J9601 Acute respiratory failure with hypoxia: Secondary | ICD-10-CM | POA: Diagnosis not present

## 2024-03-02 DIAGNOSIS — G894 Chronic pain syndrome: Secondary | ICD-10-CM | POA: Diagnosis not present

## 2024-03-02 DIAGNOSIS — G4733 Obstructive sleep apnea (adult) (pediatric): Secondary | ICD-10-CM | POA: Diagnosis not present

## 2024-03-02 DIAGNOSIS — E039 Hypothyroidism, unspecified: Secondary | ICD-10-CM | POA: Diagnosis not present

## 2024-03-02 DIAGNOSIS — Z7982 Long term (current) use of aspirin: Secondary | ICD-10-CM | POA: Diagnosis not present

## 2024-03-02 DIAGNOSIS — F411 Generalized anxiety disorder: Secondary | ICD-10-CM | POA: Diagnosis not present

## 2024-03-02 DIAGNOSIS — E271 Primary adrenocortical insufficiency: Secondary | ICD-10-CM | POA: Diagnosis not present

## 2024-03-05 DIAGNOSIS — G894 Chronic pain syndrome: Secondary | ICD-10-CM | POA: Diagnosis not present

## 2024-03-05 DIAGNOSIS — M51369 Other intervertebral disc degeneration, lumbar region without mention of lumbar back pain or lower extremity pain: Secondary | ICD-10-CM | POA: Diagnosis not present

## 2024-03-05 DIAGNOSIS — E271 Primary adrenocortical insufficiency: Secondary | ICD-10-CM | POA: Diagnosis not present

## 2024-03-05 DIAGNOSIS — J189 Pneumonia, unspecified organism: Secondary | ICD-10-CM | POA: Diagnosis not present

## 2024-03-05 DIAGNOSIS — J9601 Acute respiratory failure with hypoxia: Secondary | ICD-10-CM | POA: Diagnosis not present

## 2024-03-05 DIAGNOSIS — S32010D Wedge compression fracture of first lumbar vertebra, subsequent encounter for fracture with routine healing: Secondary | ICD-10-CM | POA: Diagnosis not present

## 2024-03-27 DIAGNOSIS — M51369 Other intervertebral disc degeneration, lumbar region without mention of lumbar back pain or lower extremity pain: Secondary | ICD-10-CM | POA: Diagnosis not present

## 2024-03-27 DIAGNOSIS — E271 Primary adrenocortical insufficiency: Secondary | ICD-10-CM | POA: Diagnosis not present

## 2024-03-27 DIAGNOSIS — J9601 Acute respiratory failure with hypoxia: Secondary | ICD-10-CM | POA: Diagnosis not present

## 2024-03-27 DIAGNOSIS — J189 Pneumonia, unspecified organism: Secondary | ICD-10-CM | POA: Diagnosis not present

## 2024-03-27 DIAGNOSIS — S32010D Wedge compression fracture of first lumbar vertebra, subsequent encounter for fracture with routine healing: Secondary | ICD-10-CM | POA: Diagnosis not present

## 2024-03-27 DIAGNOSIS — G894 Chronic pain syndrome: Secondary | ICD-10-CM | POA: Diagnosis not present

## 2024-04-24 DIAGNOSIS — Z79891 Long term (current) use of opiate analgesic: Secondary | ICD-10-CM | POA: Diagnosis not present

## 2024-04-24 DIAGNOSIS — G894 Chronic pain syndrome: Secondary | ICD-10-CM | POA: Diagnosis not present

## 2024-04-24 DIAGNOSIS — M41115 Juvenile idiopathic scoliosis, thoracolumbar region: Secondary | ICD-10-CM | POA: Diagnosis not present

## 2024-04-24 DIAGNOSIS — M4726 Other spondylosis with radiculopathy, lumbar region: Secondary | ICD-10-CM | POA: Diagnosis not present

## 2024-05-12 DIAGNOSIS — M810 Age-related osteoporosis without current pathological fracture: Secondary | ICD-10-CM | POA: Diagnosis not present

## 2024-05-12 DIAGNOSIS — E039 Hypothyroidism, unspecified: Secondary | ICD-10-CM | POA: Diagnosis not present

## 2024-05-12 DIAGNOSIS — E2749 Other adrenocortical insufficiency: Secondary | ICD-10-CM | POA: Diagnosis not present

## 2024-05-12 DIAGNOSIS — E538 Deficiency of other specified B group vitamins: Secondary | ICD-10-CM | POA: Diagnosis not present

## 2024-05-12 DIAGNOSIS — I10 Essential (primary) hypertension: Secondary | ICD-10-CM | POA: Diagnosis not present

## 2024-06-23 DIAGNOSIS — M41115 Juvenile idiopathic scoliosis, thoracolumbar region: Secondary | ICD-10-CM | POA: Diagnosis not present

## 2024-06-23 DIAGNOSIS — G894 Chronic pain syndrome: Secondary | ICD-10-CM | POA: Diagnosis not present

## 2024-06-23 DIAGNOSIS — Z79891 Long term (current) use of opiate analgesic: Secondary | ICD-10-CM | POA: Diagnosis not present

## 2024-06-23 DIAGNOSIS — M4726 Other spondylosis with radiculopathy, lumbar region: Secondary | ICD-10-CM | POA: Diagnosis not present

## 2024-07-21 ENCOUNTER — Other Ambulatory Visit (HOSPITAL_BASED_OUTPATIENT_CLINIC_OR_DEPARTMENT_OTHER): Payer: Self-pay

## 2024-07-22 ENCOUNTER — Encounter: Payer: Self-pay | Admitting: Oncology

## 2024-07-22 ENCOUNTER — Other Ambulatory Visit (HOSPITAL_COMMUNITY): Payer: Self-pay

## 2024-07-22 MED ORDER — MORPHINE SULFATE 30 MG PO TABS
30.0000 mg | ORAL_TABLET | Freq: Three times a day (TID) | ORAL | 0 refills | Status: DC
  Filled 2024-07-22 – 2024-07-25 (×2): qty 90, 30d supply, fill #0

## 2024-07-25 ENCOUNTER — Other Ambulatory Visit (HOSPITAL_COMMUNITY): Payer: Self-pay

## 2024-07-28 DIAGNOSIS — R609 Edema, unspecified: Secondary | ICD-10-CM | POA: Diagnosis not present

## 2024-07-28 DIAGNOSIS — I10 Essential (primary) hypertension: Secondary | ICD-10-CM | POA: Diagnosis not present

## 2024-07-28 DIAGNOSIS — G5139 Clonic hemifacial spasm, unspecified: Secondary | ICD-10-CM | POA: Diagnosis not present

## 2024-07-29 DIAGNOSIS — Z888 Allergy status to other drugs, medicaments and biological substances status: Secondary | ICD-10-CM | POA: Diagnosis not present

## 2024-07-29 DIAGNOSIS — Z9889 Other specified postprocedural states: Secondary | ICD-10-CM | POA: Diagnosis not present

## 2024-07-29 DIAGNOSIS — N189 Chronic kidney disease, unspecified: Secondary | ICD-10-CM | POA: Diagnosis not present

## 2024-07-29 DIAGNOSIS — K208 Other esophagitis without bleeding: Secondary | ICD-10-CM | POA: Diagnosis not present

## 2024-07-29 DIAGNOSIS — J45909 Unspecified asthma, uncomplicated: Secondary | ICD-10-CM | POA: Diagnosis not present

## 2024-07-29 DIAGNOSIS — K2100 Gastro-esophageal reflux disease with esophagitis, without bleeding: Secondary | ICD-10-CM | POA: Diagnosis not present

## 2024-07-29 DIAGNOSIS — K222 Esophageal obstruction: Secondary | ICD-10-CM | POA: Diagnosis not present

## 2024-07-29 DIAGNOSIS — Z882 Allergy status to sulfonamides status: Secondary | ICD-10-CM | POA: Diagnosis not present

## 2024-07-29 DIAGNOSIS — Z885 Allergy status to narcotic agent status: Secondary | ICD-10-CM | POA: Diagnosis not present

## 2024-07-29 DIAGNOSIS — K311 Adult hypertrophic pyloric stenosis: Secondary | ICD-10-CM | POA: Diagnosis not present

## 2024-07-29 DIAGNOSIS — Z880 Allergy status to penicillin: Secondary | ICD-10-CM | POA: Diagnosis not present

## 2024-07-29 DIAGNOSIS — I129 Hypertensive chronic kidney disease with stage 1 through stage 4 chronic kidney disease, or unspecified chronic kidney disease: Secondary | ICD-10-CM | POA: Diagnosis not present

## 2024-07-29 DIAGNOSIS — E039 Hypothyroidism, unspecified: Secondary | ICD-10-CM | POA: Diagnosis not present

## 2024-08-18 DIAGNOSIS — H0102A Squamous blepharitis right eye, upper and lower eyelids: Secondary | ICD-10-CM | POA: Diagnosis not present

## 2024-08-18 DIAGNOSIS — G514 Facial myokymia: Secondary | ICD-10-CM | POA: Diagnosis not present

## 2024-08-18 DIAGNOSIS — H16121 Filamentary keratitis, right eye: Secondary | ICD-10-CM | POA: Diagnosis not present

## 2024-08-18 DIAGNOSIS — H40033 Anatomical narrow angle, bilateral: Secondary | ICD-10-CM | POA: Diagnosis not present

## 2024-08-18 DIAGNOSIS — H0102B Squamous blepharitis left eye, upper and lower eyelids: Secondary | ICD-10-CM | POA: Diagnosis not present

## 2024-08-18 DIAGNOSIS — H2513 Age-related nuclear cataract, bilateral: Secondary | ICD-10-CM | POA: Diagnosis not present

## 2024-08-20 ENCOUNTER — Other Ambulatory Visit (HOSPITAL_COMMUNITY): Payer: Self-pay

## 2024-08-20 DIAGNOSIS — M41115 Juvenile idiopathic scoliosis, thoracolumbar region: Secondary | ICD-10-CM | POA: Diagnosis not present

## 2024-08-20 DIAGNOSIS — M4726 Other spondylosis with radiculopathy, lumbar region: Secondary | ICD-10-CM | POA: Diagnosis not present

## 2024-08-20 DIAGNOSIS — G894 Chronic pain syndrome: Secondary | ICD-10-CM | POA: Diagnosis not present

## 2024-08-20 DIAGNOSIS — Z79891 Long term (current) use of opiate analgesic: Secondary | ICD-10-CM | POA: Diagnosis not present

## 2024-08-20 MED ORDER — MORPHINE SULFATE 30 MG PO TABS
30.0000 mg | ORAL_TABLET | Freq: Three times a day (TID) | ORAL | 0 refills | Status: DC | PRN
  Filled 2024-09-20: qty 90, 30d supply, fill #0

## 2024-08-20 MED ORDER — MORPHINE SULFATE 30 MG PO TABS
30.0000 mg | ORAL_TABLET | Freq: Three times a day (TID) | ORAL | 0 refills | Status: DC | PRN
  Filled 2024-08-23: qty 90, 30d supply, fill #0

## 2024-08-21 ENCOUNTER — Encounter: Payer: Self-pay | Admitting: Oncology

## 2024-08-22 ENCOUNTER — Other Ambulatory Visit (HOSPITAL_COMMUNITY): Payer: Self-pay

## 2024-08-23 ENCOUNTER — Other Ambulatory Visit (HOSPITAL_COMMUNITY): Payer: Self-pay

## 2024-08-25 DIAGNOSIS — Z6826 Body mass index (BMI) 26.0-26.9, adult: Secondary | ICD-10-CM | POA: Diagnosis not present

## 2024-08-25 DIAGNOSIS — R110 Nausea: Secondary | ICD-10-CM | POA: Diagnosis not present

## 2024-08-25 DIAGNOSIS — I959 Hypotension, unspecified: Secondary | ICD-10-CM | POA: Diagnosis not present

## 2024-08-25 DIAGNOSIS — I10 Essential (primary) hypertension: Secondary | ICD-10-CM | POA: Diagnosis not present

## 2024-08-25 DIAGNOSIS — E538 Deficiency of other specified B group vitamins: Secondary | ICD-10-CM | POA: Diagnosis not present

## 2024-08-25 DIAGNOSIS — K222 Esophageal obstruction: Secondary | ICD-10-CM | POA: Diagnosis not present

## 2024-08-25 DIAGNOSIS — Z0000 Encounter for general adult medical examination without abnormal findings: Secondary | ICD-10-CM | POA: Diagnosis not present

## 2024-08-25 DIAGNOSIS — J45909 Unspecified asthma, uncomplicated: Secondary | ICD-10-CM | POA: Diagnosis not present

## 2024-09-20 ENCOUNTER — Other Ambulatory Visit (HOSPITAL_COMMUNITY): Payer: Self-pay

## 2024-09-21 ENCOUNTER — Other Ambulatory Visit (HOSPITAL_COMMUNITY): Payer: Self-pay

## 2024-10-15 ENCOUNTER — Other Ambulatory Visit (HOSPITAL_COMMUNITY): Payer: Self-pay

## 2024-10-15 DIAGNOSIS — M41115 Juvenile idiopathic scoliosis, thoracolumbar region: Secondary | ICD-10-CM | POA: Diagnosis not present

## 2024-10-15 DIAGNOSIS — Z79891 Long term (current) use of opiate analgesic: Secondary | ICD-10-CM | POA: Diagnosis not present

## 2024-10-15 DIAGNOSIS — M4726 Other spondylosis with radiculopathy, lumbar region: Secondary | ICD-10-CM | POA: Diagnosis not present

## 2024-10-15 DIAGNOSIS — G894 Chronic pain syndrome: Secondary | ICD-10-CM | POA: Diagnosis not present

## 2024-10-15 MED ORDER — MORPHINE SULFATE 30 MG PO TABS
30.0000 mg | ORAL_TABLET | Freq: Three times a day (TID) | ORAL | 0 refills | Status: DC | PRN
  Filled 2024-10-20: qty 90, 30d supply, fill #0

## 2024-10-15 MED ORDER — MORPHINE SULFATE 30 MG PO TABS
30.0000 mg | ORAL_TABLET | Freq: Three times a day (TID) | ORAL | 0 refills | Status: AC | PRN
  Filled 2024-11-20 (×2): qty 90, 30d supply, fill #0

## 2024-10-19 ENCOUNTER — Other Ambulatory Visit (HOSPITAL_COMMUNITY): Payer: Self-pay

## 2024-10-20 ENCOUNTER — Other Ambulatory Visit (HOSPITAL_COMMUNITY): Payer: Self-pay

## 2024-10-23 DIAGNOSIS — L720 Epidermal cyst: Secondary | ICD-10-CM | POA: Diagnosis not present

## 2024-10-23 DIAGNOSIS — L309 Dermatitis, unspecified: Secondary | ICD-10-CM | POA: Diagnosis not present

## 2024-10-23 DIAGNOSIS — D692 Other nonthrombocytopenic purpura: Secondary | ICD-10-CM | POA: Diagnosis not present

## 2024-10-23 DIAGNOSIS — D485 Neoplasm of uncertain behavior of skin: Secondary | ICD-10-CM | POA: Diagnosis not present

## 2024-10-23 DIAGNOSIS — L821 Other seborrheic keratosis: Secondary | ICD-10-CM | POA: Diagnosis not present

## 2024-11-15 ENCOUNTER — Emergency Department (HOSPITAL_COMMUNITY)

## 2024-11-15 ENCOUNTER — Other Ambulatory Visit: Payer: Self-pay

## 2024-11-15 ENCOUNTER — Observation Stay (HOSPITAL_COMMUNITY)

## 2024-11-15 ENCOUNTER — Encounter (HOSPITAL_COMMUNITY): Payer: Self-pay

## 2024-11-15 ENCOUNTER — Observation Stay (HOSPITAL_COMMUNITY)
Admission: EM | Admit: 2024-11-15 | Discharge: 2024-11-19 | DRG: 644 | Disposition: A | Attending: Internal Medicine | Admitting: Internal Medicine

## 2024-11-15 DIAGNOSIS — G8929 Other chronic pain: Secondary | ICD-10-CM | POA: Diagnosis present

## 2024-11-15 DIAGNOSIS — Z79899 Other long term (current) drug therapy: Secondary | ICD-10-CM

## 2024-11-15 DIAGNOSIS — K838 Other specified diseases of biliary tract: Secondary | ICD-10-CM | POA: Diagnosis present

## 2024-11-15 DIAGNOSIS — G35D Multiple sclerosis, unspecified: Secondary | ICD-10-CM | POA: Diagnosis present

## 2024-11-15 DIAGNOSIS — Z808 Family history of malignant neoplasm of other organs or systems: Secondary | ICD-10-CM

## 2024-11-15 DIAGNOSIS — K5900 Constipation, unspecified: Secondary | ICD-10-CM | POA: Diagnosis present

## 2024-11-15 DIAGNOSIS — I700 Atherosclerosis of aorta: Secondary | ICD-10-CM | POA: Diagnosis not present

## 2024-11-15 DIAGNOSIS — Z853 Personal history of malignant neoplasm of breast: Secondary | ICD-10-CM

## 2024-11-15 DIAGNOSIS — E271 Primary adrenocortical insufficiency: Secondary | ICD-10-CM | POA: Diagnosis not present

## 2024-11-15 DIAGNOSIS — Z981 Arthrodesis status: Secondary | ICD-10-CM | POA: Diagnosis not present

## 2024-11-15 DIAGNOSIS — Z9049 Acquired absence of other specified parts of digestive tract: Secondary | ICD-10-CM

## 2024-11-15 DIAGNOSIS — Z9011 Acquired absence of right breast and nipple: Secondary | ICD-10-CM

## 2024-11-15 DIAGNOSIS — E039 Hypothyroidism, unspecified: Secondary | ICD-10-CM | POA: Diagnosis not present

## 2024-11-15 DIAGNOSIS — R079 Chest pain, unspecified: Principal | ICD-10-CM | POA: Diagnosis present

## 2024-11-15 DIAGNOSIS — I10 Essential (primary) hypertension: Secondary | ICD-10-CM | POA: Diagnosis present

## 2024-11-15 DIAGNOSIS — G4489 Other headache syndrome: Secondary | ICD-10-CM | POA: Diagnosis not present

## 2024-11-15 DIAGNOSIS — Z9682 Presence of neurostimulator: Secondary | ICD-10-CM | POA: Diagnosis not present

## 2024-11-15 DIAGNOSIS — Z8249 Family history of ischemic heart disease and other diseases of the circulatory system: Secondary | ICD-10-CM

## 2024-11-15 DIAGNOSIS — Z803 Family history of malignant neoplasm of breast: Secondary | ICD-10-CM

## 2024-11-15 DIAGNOSIS — G894 Chronic pain syndrome: Secondary | ICD-10-CM

## 2024-11-15 DIAGNOSIS — R61 Generalized hyperhidrosis: Secondary | ICD-10-CM | POA: Diagnosis not present

## 2024-11-15 DIAGNOSIS — R109 Unspecified abdominal pain: Secondary | ICD-10-CM | POA: Diagnosis present

## 2024-11-15 DIAGNOSIS — K222 Esophageal obstruction: Secondary | ICD-10-CM | POA: Diagnosis present

## 2024-11-15 DIAGNOSIS — Z87442 Personal history of urinary calculi: Secondary | ICD-10-CM

## 2024-11-15 DIAGNOSIS — Z801 Family history of malignant neoplasm of trachea, bronchus and lung: Secondary | ICD-10-CM

## 2024-11-15 DIAGNOSIS — R7989 Other specified abnormal findings of blood chemistry: Secondary | ICD-10-CM | POA: Diagnosis not present

## 2024-11-15 DIAGNOSIS — Z79890 Hormone replacement therapy (postmenopausal): Secondary | ICD-10-CM

## 2024-11-15 DIAGNOSIS — J4489 Other specified chronic obstructive pulmonary disease: Secondary | ICD-10-CM | POA: Diagnosis present

## 2024-11-15 DIAGNOSIS — G2581 Restless legs syndrome: Secondary | ICD-10-CM | POA: Diagnosis present

## 2024-11-15 DIAGNOSIS — R072 Precordial pain: Secondary | ICD-10-CM | POA: Diagnosis present

## 2024-11-15 DIAGNOSIS — J019 Acute sinusitis, unspecified: Secondary | ICD-10-CM | POA: Diagnosis present

## 2024-11-15 DIAGNOSIS — M797 Fibromyalgia: Secondary | ICD-10-CM | POA: Diagnosis present

## 2024-11-15 DIAGNOSIS — Z90710 Acquired absence of both cervix and uterus: Secondary | ICD-10-CM

## 2024-11-15 LAB — I-STAT CHEM 8, ED
BUN: 18 mg/dL (ref 8–23)
Calcium, Ion: 0.9 mmol/L — ABNORMAL LOW (ref 1.15–1.40)
Chloride: 105 mmol/L (ref 98–111)
Creatinine, Ser: 0.8 mg/dL (ref 0.44–1.00)
Glucose, Bld: 97 mg/dL (ref 70–99)
HCT: 35 % — ABNORMAL LOW (ref 36.0–46.0)
Hemoglobin: 11.9 g/dL — ABNORMAL LOW (ref 12.0–15.0)
Potassium: 5.2 mmol/L — ABNORMAL HIGH (ref 3.5–5.1)
Sodium: 137 mmol/L (ref 135–145)
TCO2: 24 mmol/L (ref 22–32)

## 2024-11-15 LAB — COMPREHENSIVE METABOLIC PANEL WITH GFR
ALT: 64 U/L — ABNORMAL HIGH (ref 0–44)
AST: 243 U/L — ABNORMAL HIGH (ref 15–41)
Albumin: 3.1 g/dL — ABNORMAL LOW (ref 3.5–5.0)
Alkaline Phosphatase: 168 U/L — ABNORMAL HIGH (ref 38–126)
Anion gap: 10 (ref 5–15)
BUN: 15 mg/dL (ref 8–23)
CO2: 26 mmol/L (ref 22–32)
Calcium: 8.2 mg/dL — ABNORMAL LOW (ref 8.9–10.3)
Chloride: 102 mmol/L (ref 98–111)
Creatinine, Ser: 0.79 mg/dL (ref 0.44–1.00)
GFR, Estimated: >60 mL/min (ref >60)
Glucose, Bld: 97 mg/dL (ref 70–99)
Potassium: 4.7 mmol/L (ref 3.5–5.1)
Sodium: 138 mmol/L (ref 135–145)
Total Bilirubin: 1.3 mg/dL — ABNORMAL HIGH (ref 0.0–1.2)
Total Protein: 5.8 g/dL — ABNORMAL LOW (ref 6.5–8.1)

## 2024-11-15 LAB — TROPONIN I (HIGH SENSITIVITY)
Troponin I (High Sensitivity): 7 ng/L (ref <18)
Troponin I (High Sensitivity): 10 ng/L (ref <18)

## 2024-11-15 LAB — CBC
HCT: 34.2 % — ABNORMAL LOW (ref 36.0–46.0)
Hemoglobin: 10.7 g/dL — ABNORMAL LOW (ref 12.0–15.0)
MCH: 26.6 pg (ref 26.0–34.0)
MCHC: 31.3 g/dL (ref 30.0–36.0)
MCV: 85.1 fL (ref 80.0–100.0)
Platelets: 247 K/uL (ref 150–400)
RBC: 4.02 MIL/uL (ref 3.87–5.11)
RDW: 16.9 % — ABNORMAL HIGH (ref 11.5–15.5)
WBC: 7.4 K/uL (ref 4.0–10.5)
nRBC: 0 % (ref 0.0–0.2)

## 2024-11-15 MED ORDER — MORPHINE SULFATE 15 MG PO TABS
30.0000 mg | ORAL_TABLET | Freq: Three times a day (TID) | ORAL | Status: DC | PRN
  Administered 2024-11-15 – 2024-11-19 (×9): 30 mg via ORAL
  Filled 2024-11-15 (×9): qty 2

## 2024-11-15 MED ORDER — PREDNISONE 1 MG PO TABS
4.0000 mg | ORAL_TABLET | Freq: Every day | ORAL | Status: DC
  Administered 2024-11-16 – 2024-11-19 (×4): 4 mg via ORAL
  Filled 2024-11-15 (×6): qty 4

## 2024-11-15 MED ORDER — LEVOTHYROXINE SODIUM 50 MCG PO TABS
50.0000 ug | ORAL_TABLET | ORAL | Status: DC
  Administered 2024-11-18: 05:00:00 50 ug via ORAL
  Filled 2024-11-15: qty 1
  Filled 2024-11-15: qty 2
  Filled 2024-11-15: qty 1

## 2024-11-15 MED ORDER — LOSARTAN POTASSIUM 50 MG PO TABS
100.0000 mg | ORAL_TABLET | Freq: Every day | ORAL | Status: DC
  Administered 2024-11-17 – 2024-11-19 (×3): 100 mg via ORAL
  Filled 2024-11-15 (×4): qty 2

## 2024-11-15 MED ORDER — LEVOTHYROXINE SODIUM 75 MCG PO TABS
75.0000 ug | ORAL_TABLET | ORAL | Status: DC
  Administered 2024-11-17 – 2024-11-19 (×2): 75 ug via ORAL
  Filled 2024-11-15 (×2): qty 1

## 2024-11-15 MED ORDER — ALUM & MAG HYDROXIDE-SIMETH 200-200-20 MG/5ML PO SUSP: 30.0000 mL | Freq: Four times a day (QID) | ORAL | Status: DC | PRN

## 2024-11-15 MED ORDER — NITROGLYCERIN 0.4 MG SL SUBL: 0.4000 mg | SUBLINGUAL_TABLET | SUBLINGUAL | Status: DC | PRN

## 2024-11-15 MED ORDER — AZITHROMYCIN 500 MG PO TABS
250.0000 mg | ORAL_TABLET | Freq: Every day | ORAL | Status: AC
  Administered 2024-11-16 – 2024-11-17 (×2): 250 mg via ORAL
  Filled 2024-11-15 (×2): qty 1

## 2024-11-15 MED ORDER — ACETAMINOPHEN 325 MG PO TABS
650.0000 mg | ORAL_TABLET | ORAL | Status: DC | PRN
  Administered 2024-11-15 – 2024-11-18 (×5): 650 mg via ORAL
  Filled 2024-11-15 (×6): qty 2

## 2024-11-15 MED ORDER — ASPIRIN 81 MG PO TBEC
81.0000 mg | DELAYED_RELEASE_TABLET | Freq: Every day | ORAL | Status: DC
  Administered 2024-11-16 – 2024-11-19 (×4): 81 mg via ORAL
  Filled 2024-11-15 (×4): qty 1

## 2024-11-15 MED ORDER — ENOXAPARIN SODIUM 40 MG/0.4ML IJ SOSY
40.0000 mg | PREFILLED_SYRINGE | Freq: Every day | INTRAMUSCULAR | Status: DC
  Administered 2024-11-16 – 2024-11-18 (×3): 40 mg via SUBCUTANEOUS
  Filled 2024-11-15 (×4): qty 0.4

## 2024-11-15 MED ORDER — PANTOPRAZOLE SODIUM 40 MG PO TBEC
40.0000 mg | DELAYED_RELEASE_TABLET | Freq: Every day | ORAL | Status: DC
  Administered 2024-11-16 – 2024-11-19 (×4): 40 mg via ORAL
  Filled 2024-11-15 (×4): qty 1

## 2024-11-15 MED ORDER — ONDANSETRON HCL 4 MG/2ML IJ SOLN
4.0000 mg | Freq: Four times a day (QID) | INTRAMUSCULAR | Status: DC | PRN
  Administered 2024-11-16 – 2024-11-18 (×8): 4 mg via INTRAVENOUS
  Filled 2024-11-15 (×8): qty 2

## 2024-11-15 NOTE — ED Triage Notes (Addendum)
 Patient BIB [facility] from home due to chest pain. Patient states she was taking a nap and awoke with sudden onset chest pressure 10/10, diaphoretic, radiating to left arm. Upon EMS arrival pain had went down to an 8/10. Pain to center of chest. Took BP at home and noticed a BP of 220/110 and is no longer on BP meds. Patient took 325 aspirin  PTA and received 2 nitroglycerin  en route. Pain upon arrival to ED 2/10. VSS A&Ox4. Patient currently on abx for known sinus infection.

## 2024-11-15 NOTE — ED Provider Notes (Signed)
 [facility] AT [facility] Provider Note   CSN: [id number] Arrival date & time: 11/15/24  2016     Patient presents with: Chest Pain   Daisy [name] is a 73 y.o. female.   73 year old female presents with substernal chest pain which woke her from her sleep.  Described as a pressure sensation which radiated to her left arm and was associate with diaphoresis and dyspnea.  Pain was present for about 45 minutes until EMS arrived and she was given nitroglycerin  x 1 which did make her symptoms better.  She was noted to be hypertensive prior to this with a blood pressure of 220/110.  Was given aspirin  by EMS.  The second 1 which made her be pain-free.  She has no prior history of this in the past.  Does have a strong family history of coronary disease as her father had his first MI at age 4 and died in his 45s.       Prior to Admission medications  Medication Sig Start Date End Date Taking? Authorizing Provider  albuterol  (VENTOLIN  HFA) 108 (90 Base) MCG/ACT inhaler Inhale 2 puffs into the lungs every 6 (six) hours as needed for wheezing or shortness of breath. 01/31/24   Gherghe, Costin M, MD  amLODipine  (NORVASC ) 2.5 MG tablet Take 2.5 mg by mouth See admin instructions. Take 2.5 mg by mouth in the morning and an additional 2.5 mg if the systolic number is 160 or greater    [provider]  aspirin  EC 325 MG tablet Take 325 mg by mouth 2 (two) times daily as needed (for headaches).    [provider]  cyanocobalamin  (,VITAMIN B-12,) 1000 MCG/ML injection Inject 1,000 mcg into the muscle every 30 (thirty) days.    [provider]  levothyroxine  (SYNTHROID ) 75 MCG tablet Take 75 mcg by mouth See admin instructions. Take 75 mcg by mouth 30 minutes before breakfast on odd-numbered days, (alternating with 50 mcg on even-numbered days)    [provider]  levothyroxine  (SYNTHROID , LEVOTHROID) 50 MCG tablet Take 50 mcg by mouth  See admin instructions. Take 50 mcg by mouth 30 minutes before breakfast on even-numbered days, (alternating with 75 mcg on odd-numbered days)    [provider]  losartan  (COZAAR ) 100 MG tablet Take 100 mg by mouth in the morning. 08/03/17   [provider]  morphine  (MSIR) 30 MG tablet Take 30 mg by mouth See admin instructions. Take 30 mg by mouth at 12 MIDNIGHT, 8 AM, 2 PM, and 8 PM    [provider]  morphine  (MSIR) 30 MG tablet Take 1 tablet (30 mg total) by mouth every 8 (eight) hours as needed for pain 07/21/24     morphine  (MSIR) 30 MG tablet Take 1 tablet (30 mg total) by mouth every 8 (eight) hours as needed for pain 09/20/24     morphine  (MSIR) 30 MG tablet Take 1 tablet (30 mg total) by mouth every 8 (eight) hours as needed for pain 08/23/24     morphine  (MSIR) 30 MG tablet Take 1 tablet (30 mg total) by mouth every 8 (eight) hours as needed for pain. 11/17/24 12/17/24    morphine  (MSIR) 30 MG tablet Take 1 tablet (30 mg total) by mouth every 8 (eight) hours as needed for pain. 10/20/24 11/22/24    nystatin  powder Apply 1 Application topically 3 (three) times daily as needed (for irritation).    [provider]  ondansetron  (ZOFRAN  ODT) 8 MG  disintegrating tablet Take 1 tablet (8 mg total) by mouth every 8 (eight) hours as needed for nausea or vomiting. Patient not taking: Reported on 01/24/2024 10/14/21   Bernard Drivers, MD  ondansetron  (ZOFRAN -ODT) 4 MG disintegrating tablet Take 4-8 mg by mouth every 8 (eight) hours as needed for nausea or vomiting (DISSOLVE ORALLY).    [provider]  pantoprazole  (PROTONIX ) 40 MG tablet Take 40 mg by mouth daily before breakfast.     [provider]  predniSONE  (DELTASONE ) 1 MG tablet Take 4 mg by mouth daily with breakfast.    [provider]  senna-docusate (SENOKOT-S) 8.6-50 MG tablet Take 1 tablet 2 (two) times daily by mouth. Patient taking differently: Take 1 tablet by mouth in the  morning. 10/18/17   Jerri Keys, MD  valACYclovir  (VALTREX ) 1000 MG tablet Take 1,000 mg by mouth 3 (three) times daily as needed (as directed for shingles outbreaks).    [provider]  VIACTIV CALCIUM  PLUS D 650-12.5-40 MG-MCG CHEW Chew 1-2 tablets by mouth daily.    [provider]  spironolactone (ALDACTONE) 25 MG tablet Take 25 mg by mouth 2 (two) times daily. For swelling   02/19/12  [provider]    Allergies: Alendronate, Gabapentin, Iodinated contrast media, Iodine-131, Iohexol , Procaine, Sulfa antibiotics, Terbinafine hcl, Amoxicillin-pot clavulanate, Clarithromycin, Clindamycin /lincomycin, Compazine, Dilaudid  [hydromorphone  hcl], Guaifenesin, Imiquimod, Lisinopril, Methocarbamol , Metoclopramide, Other, Prochlorperazine, Promethazine , Sucralfate, Sulfamethoxazole-trimethoprim, Trifluoperazine, Zolpidem, Buspar [buspirone], Clonazepam , and Metformin    Review of Systems  All other systems reviewed and are negative.   Updated Vital Signs BP (!) 168/83 (BP Location: Left Arm)   Pulse 82   Temp 98.2 F (36.8 C) (Oral)   Resp 20   Ht 1.499 m (4' 11)   Wt 50.8 kg   SpO2 97%   BMI 22.62 kg/m   Physical Exam Vitals and nursing note reviewed.  Constitutional:      General: She is not in acute distress.    Appearance: Normal appearance. She is well-developed. She is not toxic-appearing.  HENT:     Head: Normocephalic and atraumatic.  Eyes:     General: Lids are normal.     Conjunctiva/sclera: Conjunctivae normal.     Pupils: Pupils are equal, round, and reactive to light.  Neck:     Thyroid : No thyroid  mass.     Trachea: No tracheal deviation.  Cardiovascular:     Rate and Rhythm: Normal rate and regular rhythm.     Heart sounds: Normal heart sounds. No murmur heard.    No gallop.  Pulmonary:     Effort: Pulmonary effort is normal. No respiratory distress.     Breath sounds: Normal breath sounds. No stridor. No decreased breath sounds, wheezing,  rhonchi or rales.  Abdominal:     General: There is no distension.     Palpations: Abdomen is soft.     Tenderness: There is no abdominal tenderness. There is no rebound.  Musculoskeletal:        General: No tenderness. Normal range of motion.     Cervical back: Normal range of motion and neck supple.  Skin:    General: Skin is warm and dry.     Findings: No abrasion or rash.  Neurological:     Mental Status: She is alert and oriented to person, place, and time. Mental status is at baseline.     GCS: GCS eye subscore is 4. GCS verbal subscore is 5. GCS motor subscore is 6.  Cranial Nerves: No cranial nerve deficit.     Sensory: No sensory deficit.     Motor: Motor function is intact.  Psychiatric:        Attention and Perception: Attention normal.        Speech: Speech normal.        Behavior: Behavior normal.     (all labs ordered are listed, but only abnormal results are displayed) Labs Reviewed  CBC  COMPREHENSIVE METABOLIC PANEL WITH GFR  I-STAT CHEM 8, ED  TROPONIN I (HIGH SENSITIVITY)    EKG: EKG Interpretation Date/Time:  Saturday November 15 2024 20:33:36 EST Ventricular Rate:  80 PR Interval:  156 QRS Duration:  74 QT Interval:  380 QTC Calculation: 438 R Axis:   60  Text Interpretation: Normal sinus rhythm Normal ECG When compared with ECG of 25-Jan-2024 08:22, PREVIOUS ECG IS PRESENT Confirmed by Dasie Faden ([id number]) on 11/15/2024 8:46:47 PM  Radiology: No results found.   Procedures   Medications Ordered in the ED - No data to display                                  Medical Decision Making Amount and/or Complexity of Data Reviewed Labs: ordered. Radiology: ordered.   Heart score is 5.  Patient is EKG shows sinus rhythm.  No signs of acute ischemic changes noted.  For troponin here is negative.  She is pain-free.  She will require admission for cardiac workup.  Will consult hospitalist.     Final diagnoses:  None    ED Discharge  Orders     None          Dasie Faden, MD 11/15/24 2235

## 2024-11-15 NOTE — H&P (Signed)
 History and Physical    Daisy [name] FMW:[id number] DOB: 01/14/1951 DOA: 11/15/2024  PCP: Okey Carlin Redbird, MD   Patient coming from: Home   Chief Complaint: Chest pain, diaphoresis, nausea   HPI: Daisy [name] is a 73 y.o. female with medical history significant for hypertension, Addison's disease, hypothyroidism, multiple sclerosis, OSA not on CPAP, breast cancer, esophageal stenosis, and chronic back pain who presents with chest pain.  Patient reports that she was recently diagnosed with sinusitis and started on azithromycin .  She was feeling better from that perspective when she took a nap this evening but woke with 10/10 pain in the central chest with radiation to the left arm, diaphoresis, and nausea.  She has never experienced something like this previously.  EMS was called and she was given aspirin  325 mg and NTG x 2 prior to arrival in the ED with near-resolution of her symptoms.   ED Course: Upon arrival to the ED, patient is found to be afebrile and saturating well on room air with normal HR and stable BP.  Labs are most notable for normal creatinine, alkaline phosphatase 168, AST 243, ALT 64, total bilirubin 1.3, normal WBC, hemoglobin 10.7, and normal troponin.  There are no acute findings on chest x-ray.  EKG demonstrates sinus rhythm.  Review of Systems:  All other systems reviewed and apart from HPI, are negative.  Past Medical History:  Diagnosis Date   Addison's disease (HCC)    Adenocarcinoma of breast (HCC) 07/13/1992   LEFT LUMPECTOMY AND CHEMO AND RADIATION;   NOW HAS RIGHT BREAST MASS 09/25/14 - PLANS MASTECTOMY    Adrenal insufficiency    w/secondary Addison's   Anemia    Arthritis    Asthma    using incentive spirometer alot now   Brittle bone disease    Bruises easily    Chronic bronchitis    Chronic pain    DR. MARK PHILLIPS WITH PAIN CLINIC -  CHRONIC BACK PAIN AND SCOLIOSIS AND PREVIOUS BACK SURGERY AND NOW LUMBAR FRACTURE - PT HAS [employer]  SPINAL STIMULATOR WHICH SHE CAN TURN ON AND OFF ; PT ON MORPINE AND CLONAZEPAM  AND ROBAXIN    COPD (chronic obstructive pulmonary disease) (HCC)    DDD (degenerative disc disease), lumbar    Eye pain, right    last 4 months can be both eyes   Falls frequently    Family history of adverse reaction to anesthesia    ponv   Fibromyalgia    Fracture    LUMBAR 1 - FROM A FALL JUNE 2015 - PT HAS A LOT OF SEVERE BACK PAIN AND DIFFICULTY WALKING   GERD (gastroesophageal reflux disease)    H/O esophageal spasm    I can have as many as 93 TUMS before they stop   H/O hiatal hernia    Head injury with loss of consciousness (HCC)    Headache(784.0)    when I don't eat (02/19/12)   Hepatitis B infection 1971   in high school; after drinking 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 ALLINE IS PT'S UROLOGIST   Hypertension    Hypothyroidism    Mass    right chest wall   Migraines    much better now (02/19/12)   Multiple sclerosis 05/1988   Relasping and Remitting   OSA (obstructive sleep apnea)    Paget's  disease of breast (HCC) 2010   left   Pneumonia    PONV (postoperative nausea and vomiting)    one episode 1993 vomiting after surgery   Poor venous access    PT STATES HER PORT A CATH NOT TO BE USED FOR SURGERY/ IV'S   Positive TB test 2017   ran deep in my father's family; went on INH for awhile; it caused migraines   Radiation adverse effect    100% blockage on left; 50% on right salivary; can drain w/massage   Restless leg syndrome, controlled 02/19/12   much better since being on CPAP; been on that ~ 1 month now   Shingles    Shortness of breath dyspnea    with exertion, improved, asthma improved   Sleep apnea    no cpap used due to pt took off all the time    Spinal headache    Stress incontinence in female    Upper GI bleeding 1970-1980   had 4    Past  Surgical History:  Procedure Laterality Date   ABDOMINAL HYSTERECTOMY  1987   APPENDECTOMY  1960's   BACK SURGERY  ~ 2004   chipped disk out to put in permanent stimulator   BALLOON DILATION N/A 04/23/2020   Procedure: BALLOON DILATION;  Surgeon: Saintclair Jasper, MD;  Location: [facility];  Service: Gastroenterology;  Laterality: N/A;   BALLOON DILATION N/A 02/11/2021   Procedure: BALLOON DILATION;  Surgeon: Saintclair Jasper, MD;  Location: [facility];  Service: Gastroenterology;  Laterality: N/A;   BAND HEMORRHOIDECTOMY  1979   BILATERAL OOPHORECTOMY  1988   BIOPSY  06/11/2018   Procedure: BIOPSY;  Surgeon: Saintclair Jasper, MD;  Location: [facility];  Service: Gastroenterology;;   BIOPSY  12/05/2018   Procedure: BIOPSY;  Surgeon: Saintclair Jasper, MD;  Location: [facility];  Service: Gastroenterology;;   BIOPSY  04/23/2020   Procedure: BIOPSY;  Surgeon: Saintclair Jasper, MD;  [facility]: [facility];  Service: Gastroenterology;;   BIOPSY  02/11/2021   Procedure: BIOPSY;  Surgeon: Saintclair Jasper, MD;  [facility]: [facility];  Service: Gastroenterology;;   BOTOX  INJECTION N/A 11/26/2017   Procedure: BOTOX  INJECTION;  Surgeon: Saintclair Jasper, MD;  Location: [facility] END[facility]COPY;  Service: Gastroenterology;  Laterality: N/A;   BOTOX  INJECTION N/A 06/11/2018   Procedure: BOTOX  INJECTION;  Surgeon: Saintclair Jasper, MD;  [facility]: [facility];  Service: Gastroenterology;  Laterality: N/A;   BOTOX  INJECTION  12/05/2018   Procedure: BOTOX  INJECTION;  Surgeon: Saintclair Jasper, MD;  Location: [facility];  Service: Gastroenterology;;   BOTOX  INJECTION  09/09/2019   Procedure: BOTOX  INJECTION;  Surgeon: Rosalie Kitchens, MD;  Location: [facility] ENDOSCOPY;  Service: Endoscopy;;   BOTOX  INJECTION N/A 04/23/2020   Procedure: BOTOX  INJECTION;  Surgeon: Saintclair Jasper, MD;  [facility]: [facility];  Service: Gastroenterology;  Laterality: N/A;   BOTOX  INJECTION N/A 02/11/2021   Procedure: BOTOX  INJECTION;  Surgeon: Saintclair Jasper, MD;  [facility]: [facility];   Service: Gastroenterology;  Laterality: N/A;   BREAST LUMPECTOMY  07/13/1992   left    CHOLECYSTECTOMY  1980's   COLONOSCOPY     CYSTOSCOPY WITH RETROGRADE PYELOGRAM, URETEROSCOPY AND STENT PLACEMENT Right 09/29/2015   Procedure: CYSTOSCOPY WITH RIGHT RETROGRADE PYELOGRAM, URETEROSCOPY AND STENT PLACEMENT, STONE EXTRACTION WITH BASKET;  Surgeon: Alm Fragmin, MD;  Location: [facility];  Service: Urology;  Laterality: Right;   ESOPHAGEAL MANOMETRY N/A 10/03/2017   Procedure: ESOPHAGEAL MANOMETRY (EM);  Surgeon: Saintclair Jasper, MD;  [facility]: [facility];  Service: Gastroenterology;  Laterality: N/A;  ESOPHAGOGASTRODUODENOSCOPY N/A 04/17/2014   Procedure: ESOPHAGOGASTRODUODENOSCOPY (EGD);  Surgeon: Norleen JAYSON Hint, MD;  Location: [facility];  Service: Endoscopy;  Laterality: N/A;   ESOPHAGOGASTRODUODENOSCOPY N/A 11/26/2017   Procedure: ESOPHAGOGASTRODUODENOSCOPY (EGD);  Surgeon: Saintclair Jasper, MD;  Location: [facility] ENDOSCOPY;  Service: Gastroenterology;  Laterality: N/A;   ESOPHAGOGASTRODUODENOSCOPY N/A 06/11/2018   Procedure: ESOPHAGOGASTRODUODENOSCOPY (EGD);  Surgeon: Saintclair Jasper, MD;  Location: [facility];  Service: Gastroenterology;  Laterality: N/A;   ESOPHAGOGASTRODUODENOSCOPY N/A 12/05/2018   Procedure: ESOPHAGOGASTRODUODENOSCOPY (EGD);  Surgeon: Saintclair Jasper, MD;  Location: [facility];  Service: Gastroenterology;  Laterality: N/A;  w/ botox  injection   ESOPHAGOGASTRODUODENOSCOPY (EGD) WITH PROPOFOL  N/A 09/09/2019   Procedure: ESOPHAGOGASTRODUODENOSCOPY (EGD) WITH PROPOFOL ;  Surgeon: Rosalie Kitchens, MD;  Location: [facility] ENDOSCOPY;  Service: Endoscopy;  Laterality: N/A;  With probable Botox  and doubtful dilation   ESOPHAGOGASTRODUODENOSCOPY (EGD) WITH PROPOFOL  N/A 04/23/2020   Procedure: ESOPHAGOGASTRODUODENOSCOPY (EGD) WITH PROPOFOL ;  Surgeon: Saintclair Jasper, MD;  [facility]: [facility];  Service: Gastroenterology;  Laterality: N/A;   ESOPHAGOGASTRODUODENOSCOPY (EGD) WITH PROPOFOL  N/A 02/11/2021   Procedure:  ESOPHAGOGASTRODUODENOSCOPY (EGD) WITH PROPOFOL ;  Surgeon: Saintclair Jasper, MD;  [facility]: [facility];  Service: Gastroenterology;  Laterality: N/A;   EXTRACORPOREAL SHOCK WAVE LITHOTRIPSY  2016   fracture Left 03/2018   left foot fracture   HOLMIUM LASER APPLICATION Right 09/29/2015   Procedure: HOLMIUM LASER LITHOTRIPSY ;  Surgeon: Alm Fragmin, MD;  Location: [facility];  Service: Urology;  Laterality: Right;   LAPAROSCOPY     MASS EXCISION Right 07/24/2018   Procedure: EXCISION OF RIGHT CHEST WALL MASS ERAS PATHWAY;  Surgeon: Vernetta Berg, MD;  Location: [facility];  Service: General;  Laterality: Right;   MASTECTOMY  2010   left    MASTECTOMY Right 10/15   PORT-A-CATH REMOVAL Right 09/25/2016   Procedure: REMOVAL PORT-A-CATH RIGHT;  Surgeon: Krystal Russell, MD;  Location: [facility] OR;  Service: General;  Laterality: Right;   PORTACATH PLACEMENT  10/30/2012   Procedure: INSERTION PORT-A-CATH;  Surgeon: Krystal JINNY Russell, MD;  Location: [facility];  Service: General;  Laterality: Right;  Right Ultrasound guided port-a-cath insertion   SALIVARY GLAND SURGERY  ~ 2002   left was 100% blocked twice; right 50%; only had one surgery   SALIVARY GLAND SURGERY LEFT     SAVORY DILATION N/A 04/17/2014   Procedure: SAVORY DILATION;  Surgeon: Norleen JAYSON Hint, MD;  Location: [facility];  Service: Endoscopy;  Laterality: N/A;   SPINAL CORD STIMULATOR INSERTION  ~ 2004   SPINAL CORD STIMULATOR INSERTION N/A 04/09/2015   Procedure: Spinal cord stimulator revision and replacement of paddle electrode;  Surgeon: Victory Gens, MD;  Location: [facility];  Service: Neurosurgery;  Laterality: N/A;  Spinal cord stimulator revision and replacement of paddle electrode   TONSILLECTOMY AND ADENOIDECTOMY  1964   TOTAL MASTECTOMY Right 09/25/2014   Procedure: RIGHT MASTECTOMY;  Surgeon: Krystal Russell, MD;  Location: [facility];  Service: General;  Laterality: Right;    Social History:   reports that she has never smoked. She has  never used smokeless tobacco. She reports that she does not drink alcohol  and does not use drugs.  Allergies[1]  Family History  Problem Relation Age of Onset   Lung cancer Paternal Uncle        smoker   Breast cancer Cousin 43       maternal cousin   Throat cancer Cousin        maternal cousin     Prior to Admission medications  Medication Sig  Start Date End Date Taking? Authorizing Provider  albuterol  (VENTOLIN  HFA) 108 (90 Base) MCG/ACT inhaler Inhale 2 puffs into the lungs every 6 (six) hours as needed for wheezing or shortness of breath. 01/31/24   Gherghe, Costin M, MD  amLODipine  (NORVASC ) 2.5 MG tablet Take 2.5 mg by mouth See admin instructions. Take 2.5 mg by mouth in the morning and an additional 2.5 mg if the systolic number is 160 or greater    [provider]  aspirin  EC 325 MG tablet Take 325 mg by mouth 2 (two) times daily as needed (for headaches).    [provider]  cyanocobalamin  (,VITAMIN B-12,) 1000 MCG/ML injection Inject 1,000 mcg into the muscle every 30 (thirty) days.    [provider]  levothyroxine  (SYNTHROID ) 75 MCG tablet Take 75 mcg by mouth See admin instructions. Take 75 mcg by mouth 30 minutes before breakfast on odd-numbered days, (alternating with 50 mcg on even-numbered days)    [provider]  levothyroxine  (SYNTHROID , LEVOTHROID) 50 MCG tablet Take 50 mcg by mouth See admin instructions. Take 50 mcg by mouth 30 minutes before breakfast on even-numbered days, (alternating with 75 mcg on odd-numbered days)    [provider]  losartan  (COZAAR ) 100 MG tablet Take 100 mg by mouth in the morning. 08/03/17   [provider]  morphine  (MSIR) 30 MG tablet Take 30 mg by mouth See admin instructions. Take 30 mg by mouth at 12 MIDNIGHT, 8 AM, 2 PM, and 8 PM    [provider]  morphine  (MSIR) 30 MG tablet Take 1 tablet (30 mg total) by mouth every 8 (eight) hours as needed for pain 07/21/24      morphine  (MSIR) 30 MG tablet Take 1 tablet (30 mg total) by mouth every 8 (eight) hours as needed for pain 09/20/24     morphine  (MSIR) 30 MG tablet Take 1 tablet (30 mg total) by mouth every 8 (eight) hours as needed for pain 08/23/24     morphine  (MSIR) 30 MG tablet Take 1 tablet (30 mg total) by mouth every 8 (eight) hours as needed for pain. 11/17/24 12/17/24    morphine  (MSIR) 30 MG tablet Take 1 tablet (30 mg total) by mouth every 8 (eight) hours as needed for pain. 10/20/24 11/22/24    nystatin  powder Apply 1 Application topically 3 (three) times daily as needed (for irritation).    [provider]  ondansetron  (ZOFRAN  ODT) 8 MG disintegrating tablet Take 1 tablet (8 mg total) by mouth every 8 (eight) hours as needed for nausea or vomiting. Patient not taking: Reported on 01/24/2024 10/14/21   Bernard Drivers, MD  ondansetron  (ZOFRAN -ODT) 4 MG disintegrating tablet Take 4-8 mg by mouth every 8 (eight) hours as needed for nausea or vomiting (DISSOLVE ORALLY).    [provider]  pantoprazole  (PROTONIX ) 40 MG tablet Take 40 mg by mouth daily before breakfast.     [provider]  predniSONE  (DELTASONE ) 1 MG tablet Take 4 mg by mouth daily with breakfast.    [provider]  senna-docusate (SENOKOT-S) 8.6-50 MG tablet Take 1 tablet 2 (two) times daily by mouth. Patient taking differently: Take 1 tablet by mouth in the morning. 10/18/17   Jerri Keys, MD  valACYclovir  (VALTREX ) 1000 MG tablet Take 1,000 mg by mouth 3 (three) times daily as needed (as directed for shingles outbreaks).    [provider]  VIACTIV CALCIUM  PLUS D 650-12.5-40 MG-MCG CHEW Chew 1-2 tablets by mouth daily.  [provider]  spironolactone (ALDACTONE) 25 MG tablet Take 25 mg by mouth 2 (two) times daily. For swelling   02/19/12  [provider]    Physical Exam: Vitals:   11/15/24 2026 11/15/24 2027 11/15/24 2245  BP: (!) 168/83  115/84  Pulse: 82  71  Resp:  20  13  Temp: 98.2 F (36.8 C)    TempSrc: Oral    SpO2: 97%  97%  Weight:  50.8 kg   Height:  4' 11 (1.499 m)     Constitutional: NAD, no diaphoresis   Eyes: PERTLA, lids and conjunctivae normal ENMT: Mucous membranes are moist. Posterior pharynx clear of any exudate or lesions.   Neck: supple, no masses  Respiratory: no wheezing, no crackles. No accessory muscle use.  Cardiovascular: S1 & S2 heard, regular rate and rhythm. No extremity edema.  Abdomen: No tenderness, soft. Bowel sounds active.  Musculoskeletal: no clubbing / cyanosis. No joint deformity upper and lower extremities.   Skin: no significant rashes, lesions, ulcers. Warm, dry, well-perfused. Neurologic: CN 2-12 grossly intact. Moving all extremities. Alert and oriented.  Psychiatric: Calm. Cooperative.    Labs and Imaging on Admission: I have personally reviewed following labs and imaging studies  CBC: Recent Labs  Lab 11/15/24 2107 11/15/24 2110  WBC 7.4  --   HGB 10.7* 11.9*  HCT 34.2* 35.0*  MCV 85.1  --   PLT 247  --    Basic Metabolic Panel: Recent Labs  Lab 11/15/24 2107 11/15/24 2110  NA 138 137  K 4.7 5.2*  CL 102 105  CO2 26  --   GLUCOSE 97 97  BUN 15 18  CREATININE 0.79 0.80  CALCIUM  8.2*  --    GFR: Estimated Creatinine Clearance: 43.4 mL/min (by C-G formula based on SCr of 0.8 mg/dL). Liver Function Tests: Recent Labs  Lab 11/15/24 2107  AST 243*  ALT 64*  ALKPHOS 168*  BILITOT 1.3*  PROT 5.8*  ALBUMIN  3.1*   No results for input(s): LIPASE, AMYLASE in the last 168 hours. No results for input(s): AMMONIA in the last 168 hours. Coagulation Profile: No results for input(s): INR, PROTIME in the last 168 hours. Cardiac Enzymes: No results for input(s): CKTOTAL, CKMB, CKMBINDEX, TROPONINI in the last 168 hours. BNP (last 3 results) No results for input(s): PROBNP in the last 8760 hours. HbA1C: No results for input(s): HGBA1C in the last 72  hours. CBG: No results for input(s): GLUCAP in the last 168 hours. Lipid Profile: No results for input(s): CHOL, HDL, LDLCALC, TRIG, CHOLHDL, LDLDIRECT in the last 72 hours. Thyroid  Function Tests: No results for input(s): TSH, T4TOTAL, FREET4, T3FREE, THYROIDAB in the last 72 hours. Anemia Panel: No results for input(s): VITAMINB12, FOLATE, FERRITIN, TIBC, IRON, RETICCTPCT in the last 72 hours. Urine analysis:    Component Value Date/Time   COLORURINE YELLOW 01/24/2024 1316   APPEARANCEUR CLEAR 01/24/2024 1316   LABSPEC 1.014 01/24/2024 1316   PHURINE 7.0 01/24/2024 1316   GLUCOSEU NEGATIVE 01/24/2024 1316   HGBUR NEGATIVE 01/24/2024 1316   BILIRUBINUR NEGATIVE 01/24/2024 1316   KETONESUR NEGATIVE 01/24/2024 1316   PROTEINUR NEGATIVE 01/24/2024 1316   UROBILINOGEN 1.0 05/17/2014 1753   NITRITE NEGATIVE 01/24/2024 1316   LEUKOCYTESUR NEGATIVE 01/24/2024 1316   Sepsis Labs: @LABRCNTIP (procalcitonin:4,lacticidven:4) )No results found for this or any previous visit (from the past 240 hours).   Radiological Exams on Admission: DG Chest Portable 1 View Result Date: 11/15/2024 EXAM: 1 VIEW(S) XRAY OF THE CHEST 11/15/2024  09:45:00 PM COMPARISON: 01/30/2024 CLINICAL HISTORY: Chest Pain FINDINGS: LUNGS AND PLEURA: No focal pulmonary opacity. No pleural effusion. No pneumothorax. HEART AND MEDIASTINUM: Aortic atherosclerotic calcification. No acute abnormality of the cardiac and mediastinal silhouettes. BONES AND SOFT TISSUES: Left axillary surgical clips noted. Cervical spinal fusion hardware in place. Thoracic spinal cord stimulator device in place. Scoliosis. IMPRESSION: 1. No acute cardiopulmonary process identified. 2. Aortic atherosclerosis. Electronically signed by: Oneil Devonshire MD 11/15/2024 09:57 PM EST RP Workstation: [id number]    EKG: Independently reviewed. Sinus rhythm.   Assessment/Plan   1. Chest pain  - Woke from sleep with chest pain,  diaphoresis, nausea, and SOB that improved with nitroglycerin     - Initial troponin is normal, there are no acute findings on CXR, and no acute ischemic changes on EKG  - She was given ASA 325 mg prior to arrival  - Continue cardiac monitoring, repeat troponin and EKG, trial GI cocktail if symptoms recur, check A1c and lipids in am    2. Elevated LFTs  - Alk phos is 168, AST 243, ALT 64, and T bili 1.3 on admission; there were normal in February 2025  - Check RUQ US , viral hepatitis panel, and INR, repeat LFTs in am    3. Hypertension  - Patient reports SBP in 200s prior to arrival  - BP normal at time of admission, plan to continue losartan     4. Addison disease   - Continue prednisone    5. Chronic pain  - Prescription database reviewed, plan to continue morphine  IR   6. Hypothyroidism  - Continue levothyroxine    7. Acute sinusitis  - Complete course of azithromycin    8. Multiple sclerosis  - Currently inactive per patient    DVT prophylaxis: Lovenox   Code Status: Full  Level of Care: Level of care: Telemetry Family Communication: None present  Disposition Plan:  Patient is from: Home  Anticipated d/c is to: Home  Anticipated d/c date is: 12/14 or 11/17/24 Patient currently: Pending cardiac monitoring, repeat troponin, RUQ US , trend in LFTs  Consults called: none  Admission status: Observation     Evalene GORMAN Sprinkles, MD [facility]  11/15/2024, 11:24 PM       [1]  Allergies Allergen Reactions   Alendronate Other (See Comments)    ESOPHAGEAL IRRITATION DANGER OF ESOPHAGEAL PERF.   Gabapentin Anaphylaxis   Iodinated Contrast Media Anaphylaxis and Other (See Comments)    Fever and blood clots, also ANAPHYLACTIC REACTION!-kmr-10/16/05   Iodine-131 Anaphylaxis   Iohexol  Anaphylaxis and Other (See Comments)    ANAPHYLACTIC REACTION!-kmr-10/16/05    Procaine Anaphylaxis, Rash and Other (See Comments)    Novocaine caused shaking, rash, chest heaviness,  made patient feel sick     Sulfa Antibiotics Swelling and Other (See Comments)    SWELLING REACTION UNSPECIFIED  HALLUCINATIONS    Terbinafine Hcl Anaphylaxis and Other (See Comments)    THROAT CLOSED   Amoxicillin-Pot Clavulanate Diarrhea and Other (See Comments)    Bad headaches (tolerates PCN, per husband) Has patient had a PCN reaction causing immediate rash, facial/tongue/throat swelling, SOB or lightheadedness with hypotension: no Has patient had a PCN reaction causing severe rash involving mucus membranes or skin necrosis: no Has patient had a PCN reaction that required hospitalization no Has patient had a PCN reaction occurring within the last 10 years: no If all of the above answers are NO, then may proceed with Cephalosporin use.   Clarithromycin Anxiety and Other (See Comments)    Caused the patient to  be jittery   Clindamycin /Lincomycin Other (See Comments)    Reaction not recalled   Compazine Anxiety and Other (See Comments)    Hyper and shaky   Dilaudid  [Hydromorphone  Hcl] Nausea And Vomiting and Other (See Comments)    HEADACHES   Guaifenesin Nausea And Vomiting, Swelling, Anxiety and Other (See Comments)    Headaches and hyperactivity (SWELLING REACTION UNSPECIFIED)   Imiquimod Other (See Comments)    Active and sick    Lisinopril Cough   Methocarbamol  Other (See Comments)    Makes pt feel irritable     Metoclopramide Other (See Comments)    Hyper and shaky    Other Hives, Rash and Other (See Comments)    SOME TAPES CAUSE SKIN IRRITATION   Prochlorperazine Anxiety and Other (See Comments)    Hyper and shaky   Promethazine  Anxiety and Other (See Comments)    DIZZINESS, also   Sucralfate Nausea Only and Other (See Comments)    Distends stomach and hyperactivity    Sulfamethoxazole-Trimethoprim Other (See Comments)    ALTERED MENTAL STATUS HALLUCINATIONS     Trifluoperazine Other (See Comments)    Hallucinations    Zolpidem Nausea And Vomiting  and Other (See Comments)    Caused the patient to be shaky, too   Buspar [Buspirone] Other (See Comments)    Changed mental status in a negative manner   Clonazepam  Other (See Comments)    Changed mental status in a negative manner   Metformin Other (See Comments)    Headaches and migraines

## 2024-11-16 ENCOUNTER — Observation Stay (HOSPITAL_COMMUNITY)

## 2024-11-16 DIAGNOSIS — Z9049 Acquired absence of other specified parts of digestive tract: Secondary | ICD-10-CM | POA: Diagnosis not present

## 2024-11-16 DIAGNOSIS — R7989 Other specified abnormal findings of blood chemistry: Secondary | ICD-10-CM | POA: Diagnosis not present

## 2024-11-16 DIAGNOSIS — R079 Chest pain, unspecified: Secondary | ICD-10-CM | POA: Diagnosis not present

## 2024-11-16 DIAGNOSIS — R109 Unspecified abdominal pain: Secondary | ICD-10-CM | POA: Diagnosis not present

## 2024-11-16 DIAGNOSIS — K838 Other specified diseases of biliary tract: Secondary | ICD-10-CM | POA: Diagnosis not present

## 2024-11-16 DIAGNOSIS — Z90710 Acquired absence of both cervix and uterus: Secondary | ICD-10-CM | POA: Diagnosis not present

## 2024-11-16 DIAGNOSIS — K7689 Other specified diseases of liver: Secondary | ICD-10-CM | POA: Diagnosis not present

## 2024-11-16 LAB — COMPREHENSIVE METABOLIC PANEL WITH GFR
ALT: 145 U/L — ABNORMAL HIGH (ref 0–44)
AST: 369 U/L — ABNORMAL HIGH (ref 15–41)
Albumin: 3 g/dL — ABNORMAL LOW (ref 3.5–5.0)
Alkaline Phosphatase: 188 U/L — ABNORMAL HIGH (ref 38–126)
Anion gap: 8 (ref 5–15)
BUN: 13 mg/dL (ref 8–23)
CO2: 27 mmol/L (ref 22–32)
Calcium: 8.1 mg/dL — ABNORMAL LOW (ref 8.9–10.3)
Chloride: 100 mmol/L (ref 98–111)
Creatinine, Ser: 0.74 mg/dL (ref 0.44–1.00)
GFR, Estimated: >60 mL/min (ref >60)
Glucose, Bld: 85 mg/dL (ref 70–99)
Potassium: 3.5 mmol/L (ref 3.5–5.1)
Sodium: 135 mmol/L (ref 135–145)
Total Bilirubin: 1.3 mg/dL — ABNORMAL HIGH (ref 0.0–1.2)
Total Protein: 5.9 g/dL — ABNORMAL LOW (ref 6.5–8.1)

## 2024-11-16 LAB — HEPATITIS PANEL, ACUTE
HCV Ab: NONREACTIVE
Hep A IgM: NONREACTIVE
Hep B C IgM: NONREACTIVE
Hepatitis B Surface Ag: NONREACTIVE

## 2024-11-16 LAB — CBC
HCT: 32.1 % — ABNORMAL LOW (ref 36.0–46.0)
Hemoglobin: 10 g/dL — ABNORMAL LOW (ref 12.0–15.0)
MCH: 26.4 pg (ref 26.0–34.0)
MCHC: 31.2 g/dL (ref 30.0–36.0)
MCV: 84.7 fL (ref 80.0–100.0)
Platelets: 237 K/uL (ref 150–400)
RBC: 3.79 MIL/uL — ABNORMAL LOW (ref 3.87–5.11)
RDW: 16.9 % — ABNORMAL HIGH (ref 11.5–15.5)
WBC: 4.8 K/uL (ref 4.0–10.5)
nRBC: 0 % (ref 0.0–0.2)

## 2024-11-16 LAB — LIPID PANEL
Cholesterol: 176 mg/dL (ref 0–200)
HDL: 72 mg/dL (ref >40)
LDL Cholesterol: 96 mg/dL (ref 0–99)
Total CHOL/HDL Ratio: 2.4 ratio
Triglycerides: 39 mg/dL (ref <150)
VLDL: 8 mg/dL (ref 0–40)

## 2024-11-16 LAB — PROTIME-INR
INR: 1 (ref 0.8–1.2)
Prothrombin Time: 14.2 s (ref 11.4–15.2)

## 2024-11-16 LAB — HEMOGLOBIN A1C
Hgb A1c MFr Bld: 5.8 % — ABNORMAL HIGH (ref 4.8–5.6)
Mean Plasma Glucose: 119.76 mg/dL

## 2024-11-16 MED ORDER — DIPHENHYDRAMINE HCL 50 MG/ML IJ SOLN
25.0000 mg | Freq: Once | INTRAMUSCULAR | Status: DC
  Filled 2024-11-16: qty 1

## 2024-11-16 MED ORDER — DIPHENHYDRAMINE HCL 25 MG PO CAPS: 50.0000 mg | ORAL_CAPSULE | Freq: Once | ORAL | Status: AC

## 2024-11-16 MED ORDER — METHYLPREDNISOLONE SODIUM SUCC 40 MG IJ SOLR
40.0000 mg | Freq: Once | INTRAMUSCULAR | Status: AC
  Administered 2024-11-16: 40 mg via INTRAVENOUS
  Filled 2024-11-16: qty 1

## 2024-11-16 MED ORDER — DIPHENHYDRAMINE HCL 50 MG/ML IJ SOLN
50.0000 mg | Freq: Once | INTRAMUSCULAR | Status: AC
  Administered 2024-11-16: 50 mg via INTRAVENOUS
  Filled 2024-11-16: qty 1

## 2024-11-16 NOTE — Progress Notes (Signed)
° °  Brief Progress Note   _____________________________________________________________________________________________________________  Patient Name: Daisy [name] Patient DOB: 1951-04-08 Date: @TODAY @      Data: Reviewed vital signs, labs, and clinical notes.    Action: No action required at this time.    Response: ? Discharge pending Trops, LFTs trend, and US  results.  _____________________________________________________________________________________________________________  [facility] RN Expeditor Krystn Dermody S Donavan Kerlin Please contact us  directly via secure chat (search for [facility]) or by calling us  at [phone] [facility]).

## 2024-11-16 NOTE — Hospital Course (Signed)
 73 y.o. female with medical history significant for hypertension, Addison's disease, hypothyroidism, multiple sclerosis, OSA not on CPAP, breast cancer, esophageal stenosis, and chronic back pain who presents with chest pain.   Patient reports that she was recently diagnosed with sinusitis and started on azithromycin .  She was feeling better from that perspective when she took a nap this evening but woke with 10/10 pain in the central chest with radiation to the left arm, diaphoresis, and nausea.  She has never experienced something like this previously.  EMS was called and she was given aspirin  325 mg and NTG x 2 prior to arrival in the ED with near-resolution of her symptoms.    ED Course: Upon arrival to the ED, patient is found to be afebrile and saturating well on room air with normal HR and stable BP.  Labs are most notable for normal creatinine, alkaline phosphatase 168, AST 243, ALT 64, total bilirubin 1.3, normal WBC, hemoglobin 10.7, and normal troponin.  There are no acute findings on chest x-ray.  EKG demonstrates sinus rhythm.

## 2024-11-16 NOTE — Progress Notes (Signed)
 Progress Note   Patient: Daisy [name] FMW:[id number] DOB: September 02, 1951 DOA: 11/15/2024     0 DOS: the patient was seen and examined on 11/16/2024   Brief hospital course: 73 y.o. female with medical history significant for hypertension, Addison's disease, hypothyroidism, multiple sclerosis, OSA not on CPAP, breast cancer, esophageal stenosis, and chronic back pain who presents with chest pain.   Patient reports that she was recently diagnosed with sinusitis and started on azithromycin .  She was feeling better from that perspective when she took a nap this evening but woke with 10/10 pain in the central chest with radiation to the left arm, diaphoresis, and nausea.  She has never experienced something like this previously.  EMS was called and she was given aspirin  325 mg and NTG x 2 prior to arrival in the ED with near-resolution of her symptoms.    ED Course: Upon arrival to the ED, patient is found to be afebrile and saturating well on room air with normal HR and stable BP.  Labs are most notable for normal creatinine, alkaline phosphatase 168, AST 243, ALT 64, total bilirubin 1.3, normal WBC, hemoglobin 10.7, and normal troponin.  There are no acute findings on chest x-ray.  EKG demonstrates sinus rhythm.  Assessment and Plan: 1. Chest pain  - Woke from sleep with chest pain, diaphoresis, nausea, and SOB that improved with nitroglycerin     - Initial troponin is normal, there are no acute findings on CXR, and no acute ischemic changes on EKG  - She was given ASA 325 mg prior to arrival  - serial trop neg. Symptoms seem more abd in nature with pt describing lower quadrant discomfort -Plan CT abd/pelvis   2. Elevated LFTs  - Alk phos is 168, AST 243, ALT 64, and T bili 1.3 on admission; there were normal in February 2025  - RUQ US  withi suspected hepatic steatosis, elements of early cirrhosis -viral hepatitis panel neg -LFT's trending up slightly   3. Hypertension  - Patient reports SBP  in 200s prior to arrival  - BP normal at time of admission, plan to continue losartan      4. Addison disease   - Continue prednisone     5. Chronic pain  - Prescription database reviewed, plan to continue morphine  IR    6. Hypothyroidism  - Continue levothyroxine     7. Acute sinusitis  - Complete course of azithromycin     8. Multiple sclerosis  - Currently inactive per patient       Subjective: still having lower quadrant abd pain  Physical Exam: Vitals:   11/16/24 1400 11/16/24 1435 11/16/24 1705 11/16/24 1800  BP: (!) 168/93  (!) 139/94 (!) 151/86  Pulse: 72  70 69  Resp: 16  16 19   Temp:  98 F (36.7 C)    TempSrc:      SpO2: 100%  96% 93%  Weight:      Height:       General exam: Awake, laying in bed, in nad Respiratory system: Normal respiratory effort, no wheezing Cardiovascular system: regular rate, s1, s2 Gastrointestinal system: Soft, nondistended, positive BS Central nervous system: CN2-12 grossly intact, strength intact Extremities: Perfused, no clubbing Skin: Normal skin turgor, no notable skin lesions seen Psychiatry: Mood normal // no visual hallucinations   Data Reviewed:  Labs reviewed: Na 135, K 3.5, Cr 0.74, Alk phos 188, AST 369, ALT 145, TB 1.3WBC 4.8, Hgb 10.0, Plts 237  Family Communication: Pt in room, family not at bedside  Disposition: Status is: Observation The patient remains OBS appropriate and will d/c before 2 midnights.  Planned Discharge Destination: Home    Author: Garnette Pelt, MD 11/16/2024 6:19 PM  For on call review [email or web address].

## 2024-11-17 DIAGNOSIS — K222 Esophageal obstruction: Secondary | ICD-10-CM | POA: Diagnosis present

## 2024-11-17 DIAGNOSIS — I10 Essential (primary) hypertension: Secondary | ICD-10-CM | POA: Diagnosis present

## 2024-11-17 DIAGNOSIS — M797 Fibromyalgia: Secondary | ICD-10-CM | POA: Diagnosis present

## 2024-11-17 DIAGNOSIS — Z9049 Acquired absence of other specified parts of digestive tract: Secondary | ICD-10-CM | POA: Diagnosis not present

## 2024-11-17 DIAGNOSIS — Z853 Personal history of malignant neoplasm of breast: Secondary | ICD-10-CM | POA: Diagnosis not present

## 2024-11-17 DIAGNOSIS — E039 Hypothyroidism, unspecified: Secondary | ICD-10-CM | POA: Diagnosis present

## 2024-11-17 DIAGNOSIS — Z808 Family history of malignant neoplasm of other organs or systems: Secondary | ICD-10-CM | POA: Diagnosis not present

## 2024-11-17 DIAGNOSIS — J019 Acute sinusitis, unspecified: Secondary | ICD-10-CM | POA: Diagnosis present

## 2024-11-17 DIAGNOSIS — Z87442 Personal history of urinary calculi: Secondary | ICD-10-CM | POA: Diagnosis not present

## 2024-11-17 DIAGNOSIS — R61 Generalized hyperhidrosis: Secondary | ICD-10-CM | POA: Diagnosis present

## 2024-11-17 DIAGNOSIS — Z803 Family history of malignant neoplasm of breast: Secondary | ICD-10-CM | POA: Diagnosis not present

## 2024-11-17 DIAGNOSIS — Z8249 Family history of ischemic heart disease and other diseases of the circulatory system: Secondary | ICD-10-CM | POA: Diagnosis not present

## 2024-11-17 DIAGNOSIS — G8929 Other chronic pain: Secondary | ICD-10-CM | POA: Diagnosis present

## 2024-11-17 DIAGNOSIS — Z9011 Acquired absence of right breast and nipple: Secondary | ICD-10-CM | POA: Diagnosis not present

## 2024-11-17 DIAGNOSIS — J4489 Other specified chronic obstructive pulmonary disease: Secondary | ICD-10-CM | POA: Diagnosis present

## 2024-11-17 DIAGNOSIS — R072 Precordial pain: Secondary | ICD-10-CM | POA: Diagnosis present

## 2024-11-17 DIAGNOSIS — K5900 Constipation, unspecified: Secondary | ICD-10-CM | POA: Diagnosis present

## 2024-11-17 DIAGNOSIS — G2581 Restless legs syndrome: Secondary | ICD-10-CM | POA: Diagnosis present

## 2024-11-17 DIAGNOSIS — Z801 Family history of malignant neoplasm of trachea, bronchus and lung: Secondary | ICD-10-CM | POA: Diagnosis not present

## 2024-11-17 DIAGNOSIS — Z90710 Acquired absence of both cervix and uterus: Secondary | ICD-10-CM | POA: Diagnosis not present

## 2024-11-17 DIAGNOSIS — Z79890 Hormone replacement therapy (postmenopausal): Secondary | ICD-10-CM | POA: Diagnosis not present

## 2024-11-17 DIAGNOSIS — E271 Primary adrenocortical insufficiency: Secondary | ICD-10-CM | POA: Diagnosis present

## 2024-11-17 DIAGNOSIS — K838 Other specified diseases of biliary tract: Secondary | ICD-10-CM | POA: Diagnosis present

## 2024-11-17 DIAGNOSIS — R079 Chest pain, unspecified: Secondary | ICD-10-CM | POA: Diagnosis present

## 2024-11-17 DIAGNOSIS — G35D Multiple sclerosis, unspecified: Secondary | ICD-10-CM | POA: Diagnosis present

## 2024-11-17 LAB — COMPREHENSIVE METABOLIC PANEL WITH GFR
ALT: 143 U/L — ABNORMAL HIGH (ref 0–44)
AST: 189 U/L — ABNORMAL HIGH (ref 15–41)
Albumin: 3.1 g/dL — ABNORMAL LOW (ref 3.5–5.0)
Alkaline Phosphatase: 174 U/L — ABNORMAL HIGH (ref 38–126)
Anion gap: 10 (ref 5–15)
BUN: 13 mg/dL (ref 8–23)
CO2: 26 mmol/L (ref 22–32)
Calcium: 8.5 mg/dL — ABNORMAL LOW (ref 8.9–10.3)
Chloride: 102 mmol/L (ref 98–111)
Creatinine, Ser: 0.77 mg/dL (ref 0.44–1.00)
GFR, Estimated: >60 mL/min (ref >60)
Glucose, Bld: 75 mg/dL (ref 70–99)
Potassium: 3.8 mmol/L (ref 3.5–5.1)
Sodium: 138 mmol/L (ref 135–145)
Total Bilirubin: 1.2 mg/dL (ref 0.0–1.2)
Total Protein: 6 g/dL — ABNORMAL LOW (ref 6.5–8.1)

## 2024-11-17 LAB — CBC
HCT: 32.9 % — ABNORMAL LOW (ref 36.0–46.0)
Hemoglobin: 10.5 g/dL — ABNORMAL LOW (ref 12.0–15.0)
MCH: 26.8 pg (ref 26.0–34.0)
MCHC: 31.9 g/dL (ref 30.0–36.0)
MCV: 83.9 fL (ref 80.0–100.0)
Platelets: 247 K/uL (ref 150–400)
RBC: 3.92 MIL/uL (ref 3.87–5.11)
RDW: 17.2 % — ABNORMAL HIGH (ref 11.5–15.5)
WBC: 5 K/uL (ref 4.0–10.5)
nRBC: 0 % (ref 0.0–0.2)

## 2024-11-17 MED ORDER — POLYETHYLENE GLYCOL 3350 17 G PO PACK
17.0000 g | PACK | Freq: Two times a day (BID) | ORAL | Status: DC
  Administered 2024-11-17 – 2024-11-18 (×3): 17 g via ORAL
  Filled 2024-11-17 (×4): qty 1

## 2024-11-17 MED ORDER — BISACODYL 10 MG RE SUPP
10.0000 mg | Freq: Once | RECTAL | Status: AC
  Administered 2024-11-17: 12:00:00 10 mg via RECTAL
  Filled 2024-11-17: qty 1

## 2024-11-17 MED ORDER — BOOST / RESOURCE BREEZE PO LIQD CUSTOM
1.0000 | Freq: Three times a day (TID) | ORAL | Status: DC
  Administered 2024-11-18 (×2): 1 via ORAL

## 2024-11-17 NOTE — Plan of Care (Signed)

## 2024-11-17 NOTE — Evaluation (Signed)
 Physical Therapy Evaluation Patient Details Name: Daisy [name] MRN: [id number] DOB: Aug 02, 1951 Today's Date: 11/17/2024  History of Present Illness  Pt is a 73 y.o. female who presented due to chest pain with radiation to LUE. Ct pending of abd/pelvis. PMH: HTN,  Addison's disease, hypothyroidism, multiple sclerosis, OSA not on CPAP, breast cancer, esophageal stenosis, and chronic back pain  Clinical Impression  Pt is currently presenting at Mod I for bed mobility, sit to stand and short in-home distances with gait without an AD. Pt slightly unsteady with no overt LOB and mild increase in BOS with gait without RW. Spouse is able to assist at home as needed. Pt reports this is close to her baseline. Currently pt is presenting near baseline level of functioning and no skilled physical therapy services recommended. Pt will be discharged from skilled physical therapy services at this time; please re-consult if further needs arise.            If plan is discharge home, recommend the following: Assist for transportation;Assistance with cooking/housework     Equipment Recommendations None recommended by PT     Functional Status Assessment Patient has not had a recent decline in their functional status     Precautions / Restrictions Precautions Precautions: Fall Recall of Precautions/Restrictions: Intact Restrictions Weight Bearing Restrictions Per Provider Order: No      Mobility  Bed Mobility Overal bed mobility: Modified Independent      Transfers Overall transfer level: Modified independent Equipment used: None Transfers: Sit to/from Stand Sit to Stand: Modified independent (Device/Increase time)      Ambulation/Gait Ambulation/Gait assistance: Modified independent (Device/Increase time) Gait Distance (Feet): 30 Feet Assistive device: None Gait Pattern/deviations: Step-through pattern, Decreased stride length Gait velocity: mildly decreased Gait velocity interpretation:  1.31 - 2.62 ft/sec, indicative of limited community ambulator   General Gait Details: slightly increased BOS, no LOB  Stairs Stairs:  (demonstrates good strength with sit to stand and gait to perform stairs per home set up)       Balance Overall balance assessment: Mild deficits observed, not formally tested           Pertinent Vitals/Pain Pain Assessment Pain Assessment: Faces Faces Pain Scale: Hurts even more Pain Location: chronic back pain Pain Descriptors / Indicators: Discomfort Pain Intervention(s): Limited activity within patient's tolerance, Monitored during session, Repositioned    Home Living Family/patient expects to be discharged to:: Private residence Living Arrangements: Spouse/significant other;Children Available Help at Discharge: Family;Available 24 hours/day Type of Home: House Home Access: Stairs to enter Entrance Stairs-Rails: Left Entrance Stairs-Number of Steps: 3 Alternate Level Stairs-Number of Steps: flight Home Layout: Two level;Able to live on main level with bedroom/bathroom Home Equipment: Cane - single point;BSC/3in1;Rollator (4 wheels) (RW but has tennis bals on all points)      Prior Function Prior Level of Function : Needs assist       Physical Assist :  (Pt reported only householod disstances)     Mobility Comments: Pt reported only completion of household distances since Feb with walker ADLs Comments: assist with shoes/socks     Extremity/Trunk Assessment   Upper Extremity Assessment Upper Extremity Assessment: Defer to OT evaluation    Lower Extremity Assessment Lower Extremity Assessment: Generalized weakness;Overall [facility] for tasks assessed    Cervical / Trunk Assessment Cervical / Trunk Assessment: Kyphotic  Communication   Communication Communication: No apparent difficulties    Cognition Arousal: Alert Behavior During Therapy: WFL for tasks assessed/performed   PT - Cognitive impairments:  No apparent  impairments       Following commands: Intact                    Assessment/Plan    PT Assessment Patient does not need any further PT services         PT Goals (Current goals can be found in the Care Plan section)  Acute Rehab PT Goals PT Goal Formulation: All assessment and education complete, DC therapy     AM-PAC PT 6 Clicks Mobility  Outcome Measure Help needed turning from your back to your side while in a flat bed without using bedrails?: None Help needed moving from lying on your back to sitting on the side of a flat bed without using bedrails?: None Help needed moving to and from a bed to a chair (including a wheelchair)?: None Help needed standing up from a chair using your arms (e.g., wheelchair or bedside chair)?: None Help needed to walk in hospital room?: None Help needed climbing 3-5 steps with a railing? : A Little 6 Click Score: 23    End of Session Equipment Utilized During Treatment: Gait belt Activity Tolerance: Patient tolerated treatment well Patient left: in bed;with call bell/phone within reach Nurse Communication: Mobility status      Time: 8497-8478 PT Time Calculation (min) (ACUTE ONLY): 19 min   Charges:   PT Evaluation $PT Eval Low Complexity: 1 Low   PT General Charges $$ ACUTE PT VISIT: 1 Visit       Dorothyann Maier, DPT, CLT  Acute Rehabilitation Services Office: [phone] (Secure chat preferred)   Dorothyann VEAR Maier 11/17/2024, 4:27 PM

## 2024-11-17 NOTE — Evaluation (Signed)
 Occupational Therapy Evaluation Patient Details Name: Daisy [name] MRN: [id number] DOB: 12/24/1950 Today's Date: 11/17/2024   History of Present Illness   Pt is a 73 y.o. female who presented due to chest pain with radiation to LUE. Ct pending of abd/pelvis. PMH: HTN,  Addison's disease, hypothyroidism, multiple sclerosis, OSA not on CPAP, breast cancer, esophageal stenosis, and chronic back pain     Clinical Impressions Pt reported at PLOF they live with their husband and 2 cats in a 2 story home but lives on the main level with 3 STE with a rail on the L side. She reported since being sick in February they only been able to ambulate household distances with RW and needs assist with donning shoes/socks. Pt reported feeling ver fatigued and has had increase in back pain due to sleeping in the bed in ED. She was able to compelte bed mobility with mod I, sit to stand with supervision and ambulated room level ambulation with RW and supervision -CGA. Pt requires set up for UE and CGA for LE. At this time recommendation for [facility] vs OP PT at discharge and Acute Occupational Therapy to follow.      If plan is discharge home, recommend the following:   A little help with walking and/or transfers;A little help with bathing/dressing/bathroom;Assistance with cooking/housework;Assist for transportation;Help with stairs or ramp for entrance     Functional Status Assessment   Patient has had a recent decline in their functional status and demonstrates the ability to make significant improvements in function in a reasonable and predictable amount of time.     Equipment Recommendations   None recommended by OT     Recommendations for Other Services         Precautions/Restrictions   Precautions Precautions: Fall Recall of Precautions/Restrictions: Intact Restrictions Weight Bearing Restrictions Per Provider Order: No     Mobility Bed Mobility Overal bed mobility: Modified  Independent             General bed mobility comments: HOB elevated but reported they have adjustable bed    Transfers Overall transfer level: Needs assistance Equipment used: Rolling walker (2 wheels) Transfers: Sit to/from Stand Sit to Stand: Supervision           General transfer comment: increase in time and then did report some dizziness with positional changes. Messaged PT about completion of checking for orthostatic hypotention      Balance Overall balance assessment: Needs assistance Sitting-balance support: Feet supported Sitting balance-Leahy Scale: Good     Standing balance support: Bilateral upper extremity supported Standing balance-Leahy Scale: Fair                             ADL either performed or assessed with clinical judgement   ADL Overall ADL's : Needs assistance/impaired Eating/Feeding: Independent;Sitting   Grooming: Oral care;Sitting   Upper Body Bathing: Set up;Sitting   Lower Body Bathing: Contact guard assist;Sit to/from stand   Upper Body Dressing : Set up   Lower Body Dressing: Contact guard assist;Sit to/from stand   Toilet Transfer: Supervision/safety;Contact guard assist   Toileting- Clothing Manipulation and Hygiene: Contact guard assist;Sit to/from stand       Functional mobility during ADLs: Contact guard assist;Rolling walker (2 wheels)       Vision Baseline Vision/History: 0 No visual deficits Patient Visual Report:  (Pt reported increase in dropping in L eye but md think due to disease processes/MS. Also, sensative  to [facility] homes)       Perception Perception: Within Functional Limits       Praxis Praxis: [facility]       Pertinent Vitals/Pain Pain Assessment Pain Assessment: 0-10 Pain Score: 9  Pain Location: however, relating due to chronic back pain and  thinks from sleeping in ED Pain Descriptors / Indicators: Discomfort Pain Intervention(s): Limited activity within patient's tolerance,  Monitored during session, Repositioned     Extremity/Trunk Assessment Upper Extremity Assessment Upper Extremity Assessment: Generalized weakness       Cervical / Trunk Assessment Cervical / Trunk Assessment: Kyphotic   Communication Communication Communication: No apparent difficulties   Cognition Arousal: Alert Behavior During Therapy: WFL for tasks assessed/performed Cognition: No apparent impairments                               Following commands: Intact       Cueing  General Comments   Cueing Techniques: Verbal cues      Exercises     Shoulder Instructions      Home Living Family/patient expects to be discharged to:: Private residence Living Arrangements: Spouse/significant other;Children Available Help at Discharge: Family;Available 24 hours/day Type of Home: House Home Access: Stairs to enter [employer] of Steps: 3 Entrance Stairs-Rails: Left Home Layout: Two level;Able to live on main level with bedroom/bathroom Alternate Level Stairs-Number of Steps: flight   Bathroom Shower/Tub: Walk-in shower (one step; over)   Firefighter: Standard Bathroom Accessibility: Yes How Accessible: Accessible via walker Home Equipment: Cane - single point;BSC/3in1;Rollator (4 wheels) (RW but has tennis bals on all points)          Prior Functioning/Environment Prior Level of Function : Needs assist       Physical Assist :  (Pt reported only householod disstances)     Mobility Comments: Pt reported only completion of household distances since Feb with walker ADLs Comments: assist with shoes/socks    OT Problem List: Decreased strength;Decreased activity tolerance;Impaired balance (sitting and/or standing);Decreased safety awareness;Decreased knowledge of use of DME or AE;Pain   OT Treatment/Interventions: Self-care/ADL training;Therapeutic exercise;DME and/or AE instruction;Therapeutic activities;Patient/family education;Balance  training      OT Goals(Current goals can be found in the care plan section)   Acute Rehab OT Goals Patient Stated Goal: to go home OT Goal Formulation: With patient Time For Goal Achievement: 12/01/24 Potential to Achieve Goals: Good   OT Frequency:  Min 2X/week    Co-evaluation              AM-PAC OT 6 Clicks Daily Activity     Outcome Measure Help from another person eating meals?: None Help from another person taking care of personal grooming?: None Help from another person toileting, which includes using toliet, bedpan, or urinal?: A Little Help from another person bathing (including washing, rinsing, drying)?: A Little Help from another person to put on and taking off regular upper body clothing?: None Help from another person to put on and taking off regular lower body clothing?: A Little 6 Click Score: 21   End of Session Equipment Utilized During Treatment: Gait belt;Rolling walker (2 wheels) Nurse Communication: Mobility status  Activity Tolerance: Patient tolerated treatment well Patient left: in bed;with call bell/phone within reach;with nursing/sitter in room  OT Visit Diagnosis: Unsteadiness on feet (R26.81);Other abnormalities of gait and mobility (R26.89);Muscle weakness (generalized) (M62.81);Pain Pain - part of body:  (back)  Time: 8859-8796 OT Time Calculation (min): 23 min Charges:  OT General Charges $OT Visit: 1 Visit OT Evaluation $OT Eval Low Complexity: 1 Low OT Treatments $Self Care/Home Management : 8-22 mins  Warrick POUR OTR/L  Acute Rehab Services  [phone] office number   Warrick Berber 11/17/2024, 12:15 PM

## 2024-11-17 NOTE — Care Management Obs Status (Signed)
 MEDICARE OBSERVATION STATUS NOTIFICATION   Patient Details  Name: Daisy [name] MRN: [id number] Date of Birth: 1951/03/28   Medicare Observation Status Notification Given:  Yes  Verbally reviewed observation notice with Lamar Overcast telephonically at [phone]. Will deliver a copy to the patients room.   Seth Friedlander 11/17/2024, 8:49 AM

## 2024-11-17 NOTE — Progress Notes (Signed)
 Progress Note   Patient: Daisy [name] FMW:[id number] DOB: 08-21-1951 DOA: 11/15/2024     0 DOS: the patient was seen and examined on 11/17/2024   Brief hospital course: 73 y.o. female with medical history significant for hypertension, Addison's disease, hypothyroidism, multiple sclerosis, OSA not on CPAP, breast cancer, esophageal stenosis, and chronic back pain who presents with chest pain.   Patient reports that she was recently diagnosed with sinusitis and started on azithromycin .  She was feeling better from that perspective when she took a nap this evening but woke with 10/10 pain in the central chest with radiation to the left arm, diaphoresis, and nausea.  She has never experienced something like this previously.  EMS was called and she was given aspirin  325 mg and NTG x 2 prior to arrival in the ED with near-resolution of her symptoms.    ED Course: Upon arrival to the ED, patient is found to be afebrile and saturating well on room air with normal HR and stable BP.  Labs are most notable for normal creatinine, alkaline phosphatase 168, AST 243, ALT 64, total bilirubin 1.3, normal WBC, hemoglobin 10.7, and normal troponin.  There are no acute findings on chest x-ray.  EKG demonstrates sinus rhythm.  Assessment and Plan: 1. Chest pain  - resolved - Woke from sleep with chest pain, diaphoresis, nausea, and SOB that improved with nitroglycerin     - Initial troponin is normal, there are no acute findings on CXR, and no acute ischemic changes on EKG  - She was given ASA 325 mg prior to arrival  - serial trop neg. Symptoms seem more abd in nature with pt describing lower quadrant discomfort -CT abd/pelvis reviewed. Findings notable for large stool burden   2. Elevated LFTs  - Alk phos is 168, AST 243, ALT 64, and T bili 1.3 on admission; there were normal in February 2025  - RUQ US  withi suspected hepatic steatosis, elements of early cirrhosis -viral hepatitis panel neg -LFT's now  trending down   3. Hypertension  - Patient reports SBP in 200s prior to arrival  - BP normal at time of admission, plan to continue losartan      4. Addison disease   - Continue prednisone     5. Chronic pain  - Prescription database reviewed, plan to continue morphine  IR    6. Hypothyroidism  - Continue levothyroxine     7. Acute sinusitis  - Complete course of azithromycin     8. Multiple sclerosis  - Currently inactive per patient   9. Constipation - large stool burden on CT, reviewed -given trial of miralax  BID with dulcolax suppository - Remains on clear liquid diet this AM. Agreeable to advance to full liquids. Pt still appears uncomfortable      Subjective: Continues with abd fullness and poor appetite  Physical Exam: Vitals:   11/17/24 0442 11/17/24 0752 11/17/24 1207 11/17/24 1609  BP: (!) 156/91 (!) 151/102 (!) 157/76 (!) 164/88  Pulse: 75 80 79 79  Resp:  18    Temp:  98.2 F (36.8 C) 97.6 F (36.4 C) 98.6 F (37 C)  TempSrc:  Oral Oral Oral  SpO2: 98% 100% 99% 98%  Weight:      Height:       General exam: Conversant, in no acute distress Respiratory system: normal chest rise, clear, no audible wheezing Cardiovascular system: regular rhythm, s1-s2 Gastrointestinal system: distended, decreased BS Central nervous system: No seizures, no tremors Extremities: No cyanosis, no joint deformities Skin: No  rashes, no pallor Psychiatry: Affect normal // no auditory hallucinations   Data Reviewed:  Labs reviewed: Na 138, K 3.8, Cr 0.77, Alk phos 174, AST 189, ALT 143, WBC 5.0, Hgb 10.5, Plts 247  Family Communication: Pt in room, family not at bedside  Disposition: Status is: Observation The patient will require care spanning > 2 midnights and should be moved to inpatient because: severity of illness  Planned Discharge Destination: Home    Author: Garnette Pelt, MD 11/17/2024 4:12 PM  For on call review [email or web address].

## 2024-11-18 ENCOUNTER — Other Ambulatory Visit (HOSPITAL_COMMUNITY): Payer: Self-pay

## 2024-11-18 LAB — COMPREHENSIVE METABOLIC PANEL WITH GFR
ALT: 96 U/L — ABNORMAL HIGH (ref 0–44)
AST: 78 U/L — ABNORMAL HIGH (ref 15–41)
Albumin: 3 g/dL — ABNORMAL LOW (ref 3.5–5.0)
Alkaline Phosphatase: 146 U/L — ABNORMAL HIGH (ref 38–126)
Anion gap: 11 (ref 5–15)
BUN: 8 mg/dL (ref 8–23)
CO2: 25 mmol/L (ref 22–32)
Calcium: 8.2 mg/dL — ABNORMAL LOW (ref 8.9–10.3)
Chloride: 104 mmol/L (ref 98–111)
Creatinine, Ser: 0.77 mg/dL (ref 0.44–1.00)
GFR, Estimated: >60 mL/min (ref >60)
Glucose, Bld: 73 mg/dL (ref 70–99)
Potassium: 3.4 mmol/L — ABNORMAL LOW (ref 3.5–5.1)
Sodium: 140 mmol/L (ref 135–145)
Total Bilirubin: 0.8 mg/dL (ref 0.0–1.2)
Total Protein: 5.7 g/dL — ABNORMAL LOW (ref 6.5–8.1)

## 2024-11-18 LAB — CBC
HCT: 32.1 % — ABNORMAL LOW (ref 36.0–46.0)
Hemoglobin: 10.1 g/dL — ABNORMAL LOW (ref 12.0–15.0)
MCH: 26.1 pg (ref 26.0–34.0)
MCHC: 31.5 g/dL (ref 30.0–36.0)
MCV: 82.9 fL (ref 80.0–100.0)
Platelets: 257 K/uL (ref 150–400)
RBC: 3.87 MIL/uL (ref 3.87–5.11)
RDW: 17.3 % — ABNORMAL HIGH (ref 11.5–15.5)
WBC: 5.1 K/uL (ref 4.0–10.5)
nRBC: 0 % (ref 0.0–0.2)

## 2024-11-18 MED ORDER — POTASSIUM CHLORIDE CRYS ER 20 MEQ PO TBCR
60.0000 meq | EXTENDED_RELEASE_TABLET | Freq: Once | ORAL | Status: AC
  Administered 2024-11-18: 10:00:00 60 meq via ORAL
  Filled 2024-11-18: qty 3

## 2024-11-18 MED ORDER — SMOG ENEMA
960.0000 mL | Freq: Once | RECTAL | Status: AC
  Administered 2024-11-18: 16:00:00 960 mL via RECTAL
  Filled 2024-11-18: qty 960

## 2024-11-18 NOTE — TOC Initial Note (Signed)
 Transition of Care [facility]) - Initial/Assessment Note    Patient Details  Name: Daisy [name] MRN: [id number] Date of Birth: 1951/09/26  [facility] CM/SW Contact:    Marval Gell, RN Phone Number: 11/18/2024, 11:59 AM  Clinical Narrative:                  Spoke w patient at bedside to discuss DC plan. Discusses OT eval and offered to set up OP services, patient politely declined.  No DME needs.  No barriers to outpatient care. No ICM needs identified at this time   Expected Discharge Plan: Home/Self Care Barriers to Discharge: Continued Medical Work up   Patient Goals and CMS Choice Patient states their goals for this hospitalization and ongoing recovery are:: to go home CMS Medicare.gov Compare Post Acute Care list provided to:: Patient        Expected Discharge Plan and Services   Discharge Planning Services: CM Consult Post Acute Care Choice: NA Living arrangements for the past 2 months: Single Family Home                                      Prior Living Arrangements/Services Living arrangements for the past 2 months: Single Family Home Lives with:: Spouse                   Activities of Daily Living   ADL Screening (condition at time of admission) Independently performs ADLs?: Yes (appropriate for developmental age) Is the patient deaf or have difficulty hearing?: No Does the patient have difficulty seeing, even when wearing glasses/contacts?: No Does the patient have difficulty concentrating, remembering, or making decisions?: No  Permission Sought/Granted                  Emotional Assessment              Admission diagnosis:  Chest pain [R07.9] Chest pain, unspecified type [R07.9] Patient Active Problem List   Diagnosis Date Noted   Elevated LFTs 11/15/2024   Chronic pain    Esophageal dysmotility    Pericarditis 09/06/2019   Chest pain 09/06/2019   Back pain 10/12/2017   Degenerative disc disease, lumbar  10/12/2017   Compression fracture of first lumbar vertebra (HCC) 10/12/2017   GERD (gastroesophageal reflux disease) 10/12/2017   Confusion 04/18/2016   Nephrolithiasis 09/29/2015   Port-A-Cath in place 09/07/2015   Herpes zoster 09/07/2015   Spinal cord stimulator dysfunction 04/09/2015   History of left breast cancer 09/25/2014   Pyelonephritis 05/17/2014   Iron deficiency anemia 06/18/2012   DCIS (ductal carcinoma in situ) of breast 03/22/2012   Addison's disease (HCC)    Hypertension    Hypothyroidism    OBESITY 05/20/2008   Multiple sclerosis 05/1988   PCP:  Okey Carlin Redbird, MD Pharmacy:   [facility] [phone] - [place], [place] - [address] [place] [address] Phone: [phone] Fax: [phone]  [facility] [id number] - [place], [place] - [address] [place] [address] Phone: [phone] Fax: [phone]     Social Drivers of Health (SDOH) Social History: SDOH Screenings   Food Insecurity: No Food Insecurity (11/17/2024)  Housing: Low Risk (11/17/2024)  Transportation Needs: No Transportation Needs (11/17/2024)  Utilities: Not At Risk (11/17/2024)  Social Connections: Moderately Integrated (11/17/2024)  Tobacco Use: Low Risk (11/15/2024)   SDOH Interventions:  Readmission Risk Interventions    01/25/2024   11:51 AM  Readmission Risk Prevention Plan  Transportation Screening Complete  PCP or Specialist Appt within 5-7 Days Complete  Home Care Screening Complete  Medication Review (RN CM) Complete

## 2024-11-18 NOTE — Progress Notes (Signed)
 Patient requested enema from MD due to constipation. Delayed per patient request to receive antiemetic co-administration, ondansetron  and SMOG enema administered per eMAR. Patient was able to tolerate administration over 2 separate small administrations (1/2 bottle), with 2 moderate (per patient statement) bowel movements after enema instillations. Patient reported significant abdominal distention relief post administration, but still felt she had a little more in her. Patient advised against excessive enema use in a day, and patient appreciative of interventions. Patient was given washcloths and towels and was able to perform self bath in bathroom, new own provided and bed linens changed. Patient placed back in bed, call bell within reach.

## 2024-11-18 NOTE — Progress Notes (Signed)
 Progress Note   Patient: Daisy [name] FMW:[id number] DOB: [birth year] DOA: 11/15/2024     1 DOS: the patient was seen and examined on 11/18/2024   Brief hospital course: 73 y.o. female with medical history significant for hypertension, Addison's disease, hypothyroidism, multiple sclerosis, OSA not on CPAP, breast cancer, esophageal stenosis, and chronic back pain who presents with chest pain.   Patient reports that she was recently diagnosed with sinusitis and started on azithromycin .  She was feeling better from that perspective when she took a nap this evening but woke with 10/10 pain in the central chest with radiation to the left arm, diaphoresis, and nausea.  She has never experienced something like this previously.  EMS was called and she was given aspirin  325 mg and NTG x 2 prior to arrival in the ED with near-resolution of her symptoms.    ED Course: Upon arrival to the ED, patient is found to be afebrile and saturating well on room air with normal HR and stable BP.  Labs are most notable for normal creatinine, alkaline phosphatase 168, AST 243, ALT 64, total bilirubin 1.3, normal WBC, hemoglobin 10.7, and normal troponin.  There are no acute findings on chest x-ray.  EKG demonstrates sinus rhythm.  Assessment and Plan: 1. Chest pain  - resolved - Woke from sleep with chest pain, diaphoresis, nausea, and SOB that improved with nitroglycerin     - Initial troponin is normal, there are no acute findings on CXR, and no acute ischemic changes on EKG  - She was given ASA 325 mg prior to arrival  - serial trop neg. Symptoms seem more abd in nature with pt describing lower quadrant discomfort -CT abd/pelvis reviewed. Findings notable for large stool burden. No results despite scheduled miralax  and suppository -Will given trial of SMOG enema   2. Elevated LFTs  - Alk phos is 168, AST 243, ALT 64, and T bili 1.3 on admission; there were normal in February 2025  - RUQ US  withi suspected  hepatic steatosis, elements of early cirrhosis -viral hepatitis panel neg -LFT's now trending down   3. Hypertension  - Patient reports SBP in 200s prior to arrival  - BP normal at time of admission, plan to continue losartan      4. Addison disease   - Continue prednisone     5. Chronic pain  - Prescription database reviewed, plan to continue morphine  IR    6. Hypothyroidism  - Continue levothyroxine     7. Acute sinusitis  - Complete course of azithromycin     8. Multiple sclerosis  - Currently inactive per patient   9. Constipation - large stool burden on CT, reviewed -given trial of miralax  BID with dulcolax suppository without result -Will give trial os SMOG enema      Subjective: Still feeling very full. No significant BM despite cathartics overnight  Physical Exam: Vitals:   11/18/24 0023 11/18/24 0431 11/18/24 0815 11/18/24 1206  BP: (!) 159/79 137/72 (!) 157/80 119/62  Pulse: 82 76 84 75  Resp: 20  18   Temp: 97.6 F (36.4 C) 97.7 F (36.5 C) 98 F (36.7 C) 98.2 F (36.8 C)  TempSrc: Oral  Oral Oral  SpO2: 98% 94% 99% 96%  Weight:      Height:       General exam: Awake, laying in bed, in nad Respiratory system: Normal respiratory effort, no wheezing Cardiovascular system: regular rate, s1, s2 Gastrointestinal system: Soft, nondistended, positive BS Central nervous system: CN2-12 grossly intact, strength  intact Extremities: Perfused, no clubbing Skin: Normal skin turgor, no notable skin lesions seen Psychiatry: Mood normal // no visual hallucinations   Data Reviewed:  Labs reviewed: Na 140, K 3.4, Cr 0.77, Alk phos 146, AST 78, ALT 96, WBC 5.1, Hgb 10.1, Plts 257  Family Communication: Pt in room, family not at bedside  Disposition: Status is: Inpatient Continue inpatient stay because: severity of illness  Planned Discharge Destination: Home    Author: Garnette Pelt, MD 11/18/2024 2:18 PM  For on call review [email or web address].

## 2024-11-19 ENCOUNTER — Inpatient Hospital Stay (HOSPITAL_COMMUNITY)

## 2024-11-19 ENCOUNTER — Encounter: Payer: Self-pay | Admitting: Oncology

## 2024-11-19 ENCOUNTER — Other Ambulatory Visit (HOSPITAL_COMMUNITY): Payer: Self-pay

## 2024-11-19 DIAGNOSIS — R079 Chest pain, unspecified: Secondary | ICD-10-CM | POA: Diagnosis not present

## 2024-11-19 LAB — ECHOCARDIOGRAM COMPLETE
AR max vel: 2.79 cm2
AV Area VTI: 2.4 cm2
AV Area mean vel: 2.44 cm2
AV Mean grad: 2 mmHg
AV Peak grad: 2.8 mmHg
Ao pk vel: 0.84 m/s
Area-P 1/2: 2.95 cm2
Height: 59 in
S' Lateral: 2.3 cm
Weight: 1792 [oz_av]

## 2024-11-19 LAB — COMPREHENSIVE METABOLIC PANEL WITH GFR
ALT: 81 U/L — ABNORMAL HIGH (ref 0–44)
AST: 44 U/L — ABNORMAL HIGH (ref 15–41)
Albumin: 3.3 g/dL — ABNORMAL LOW (ref 3.5–5.0)
Alkaline Phosphatase: 155 U/L — ABNORMAL HIGH (ref 38–126)
Anion gap: 6 (ref 5–15)
BUN: 10 mg/dL (ref 8–23)
CO2: 28 mmol/L (ref 22–32)
Calcium: 9 mg/dL (ref 8.9–10.3)
Chloride: 105 mmol/L (ref 98–111)
Creatinine, Ser: 1.07 mg/dL — ABNORMAL HIGH (ref 0.44–1.00)
GFR, Estimated: 55 mL/min — ABNORMAL LOW (ref >60)
Glucose, Bld: 106 mg/dL — ABNORMAL HIGH (ref 70–99)
Potassium: 4.9 mmol/L (ref 3.5–5.1)
Sodium: 139 mmol/L (ref 135–145)
Total Bilirubin: 0.3 mg/dL (ref 0.0–1.2)
Total Protein: 5.6 g/dL — ABNORMAL LOW (ref 6.5–8.1)

## 2024-11-19 MED ORDER — POLYETHYLENE GLYCOL 3350 17 GM/SCOOP PO POWD
17.0000 g | Freq: Two times a day (BID) | ORAL | 0 refills | Status: AC
  Filled 2024-11-19: qty 238, 7d supply, fill #0

## 2024-11-19 MED ORDER — LOSARTAN POTASSIUM 100 MG PO TABS: 100.0000 mg | ORAL_TABLET | Freq: Every day | ORAL | Status: AC

## 2024-11-19 MED ORDER — SENNA 8.6 MG PO TABS
1.0000 | ORAL_TABLET | Freq: Every day | ORAL | 0 refills | Status: AC
  Filled 2024-11-19: qty 30, 30d supply, fill #0

## 2024-11-19 NOTE — Discharge Summary (Signed)
 Physician Discharge Summary   Patient: Daisy [name] MRN: [id number] DOB: 1951/05/16  Admit date:     11/15/2024  Discharge date: 11/19/2024  Discharge Physician: Garnette Pelt   PCP: Okey Carlin Redbird, MD   Recommendations at discharge:    Follow up with PCP in 1-2 weeks Pt is aware to follow up with her primary Gastroenterologist Recommend rechecking LFT in 1-2 weeks  Discharge Diagnoses: Principal Problem:   Chest pain Active Problems:   Addison's disease (HCC)   Hypertension   Hypothyroidism   Elevated LFTs   Multiple sclerosis   Chronic pain  Resolved Problems:   * No resolved hospital problems. *  Hospital Course: 73 y.o. female with medical history significant for hypertension, Addison's disease, hypothyroidism, multiple sclerosis, OSA not on CPAP, breast cancer, esophageal stenosis, and chronic back pain who presents with chest pain.   Patient reports that she was recently diagnosed with sinusitis and started on azithromycin .  She was feeling better from that perspective when she took a nap this evening but woke with 10/10 pain in the central chest with radiation to the left arm, diaphoresis, and nausea.  She has never experienced something like this previously.  EMS was called and she was given aspirin  325 mg and NTG x 2 prior to arrival in the ED with near-resolution of her symptoms.    ED Course: Upon arrival to the ED, patient is found to be afebrile and saturating well on room air with normal HR and stable BP.  Labs are most notable for normal creatinine, alkaline phosphatase 168, AST 243, ALT 64, total bilirubin 1.3, normal WBC, hemoglobin 10.7, and normal troponin.  There are no acute findings on chest x-ray.  EKG demonstrates sinus rhythm.  Assessment and Plan: 1. Chest pain  - resolved - Woke from sleep with chest pain, diaphoresis, nausea, and SOB that improved with nitroglycerin     - Initial troponin is normal, there are no acute findings on CXR, and no  acute ischemic changes on EKG  - She was given ASA 325 mg prior to arrival  - serial trop neg. Symptoms seem more abd in nature with pt describing lower quadrant discomfort -CT abd/pelvis reviewed. Findings notable for large stool burden. Ultimately, results were noted with trial of SMOG enema -Of note, pt does have a hx of esophageal stricture, which may result in presenting sx. Pt states she will arrange close f/u with her primary gastroenterologist at [facility]   2. Elevated LFTs  - Alk phos is 168, AST 243, ALT 64, and T bili 1.3 on admission; there were normal in February 2025  - RUQ US  withi suspected hepatic steatosis, elements of early cirrhosis -viral hepatitis panel neg -LFT's now trending down -CT abd reviewed. Hx prior cholecystectomy No stones noted. Chronically dilated CBD that is unchanged from prior, thus nothing acute. -Recommend close f/u with primary GI for further w/u of primary liver disease. -Pt is tolerating regular diet   3. Hypertension  - Patient reports SBP in 200s prior to arrival  - BP normal at time of admission, plan to continue losartan      4. Addison disease   - Continue prednisone     5. Chronic pain  - continue morphine  IR    6. Hypothyroidism  - Continue levothyroxine     7. Acute sinusitis  - Complete course of azithromycin     8. Multiple sclerosis  - Currently inactive per patient    9. Constipation - large stool burden on CT, reviewed -given  trial of miralax  BID with dulcolax suppository without result -Ultimately result noted with SMOG enema -Prescribed bowel regimen on d/c        Consultants:  Procedures performed:   Disposition: Home Diet recommendation:  Regular diet DISCHARGE MEDICATION: Allergies as of 11/19/2024       Reactions   Alendronate Other (See Comments)   ESOPHAGEAL IRRITATION DANGER OF ESOPHAGEAL PERF. Other Reaction(s): esophageal irritation   Clarithromycin Anxiety, Other (See Comments), Anaphylaxis   Caused  the patient to be jittery   Gabapentin Anaphylaxis   Gabapentin (once-daily) Anaphylaxis   Iodinated Contrast Media Anaphylaxis, Other (See Comments)   Fever and blood clots, also ANAPHYLACTIC REACTION!-kmr-10/16/05   Iodine-131 Anaphylaxis   Iohexol  Anaphylaxis, Other (See Comments)   ANAPHYLACTIC REACTION!-kmr-10/16/05   Procaine Anaphylaxis, Other (See Comments), Rash   Novocaine caused shaking, rash, chest heaviness, made patient feel sick Other Reaction(s): anaphylaxis, jerky, shaky   Sulfa Antibiotics Other (See Comments), Swelling   SWELLING REACTION UNSPECIFIED  HALLUCINATIONS Other Reaction(s): hallucinations   Terbinafine Hcl Anaphylaxis, Other (See Comments)   THROAT CLOSED   Amoxicillin-pot Clavulanate Diarrhea, Other (See Comments)   Bad headaches (tolerates PCN, per husband) Has patient had a PCN reaction causing immediate rash, facial/tongue/throat swelling, SOB or lightheadedness with hypotension: no Has patient had a PCN reaction causing severe rash involving mucus membranes or skin necrosis: no Has patient had a PCN reaction that required hospitalization no Has patient had a PCN reaction occurring within the last 10 years: no If all of the above answers are NO, then may proceed with Cephalosporin use.   Buspirone Other (See Comments)   Changed mental status in a negative manner Other Reaction(s): nausea/vomiting   Clindamycin /lincomycin Other (See Comments)   Reaction not recalled   Clonazepam  Other (See Comments)   Changed mental status in a negative manner   Compazine Anxiety, Other (See Comments)   Hyper and shaky   Dilaudid  [hydromorphone  Hcl] Nausea And Vomiting, Other (See Comments)   HEADACHES   Guaifenesin Nausea And Vomiting, Swelling, Anxiety, Other (See Comments)   Headaches and hyperactivity (SWELLING REACTION UNSPECIFIED)   Imiquimod Other (See Comments)   Active and sick    Irbesartan Other (See Comments)   Other Reaction(s): headache,  fatigue   Lisinopril Cough   Methocarbamol  Other (See Comments)   Makes pt feel irritable    Metoclopramide Other (See Comments)   Hyper and shaky   Other Hives, Rash, Other (See Comments)   SOME TAPES CAUSE SKIN IRRITATION   Prochlorperazine Anxiety, Other (See Comments)   Hyper and shaky   Promethazine  Anxiety, Other (See Comments)   DIZZINESS, also   Sucralfate Nausea Only, Other (See Comments)   Distends stomach and hyperactivity   Sulfamethoxazole-trimethoprim Other (See Comments)   ALTERED MENTAL STATUS HALLUCINATIONS    Trifluoperazine Other (See Comments)   Hallucinations   Trifluoperazine Hcl Other (See Comments)   Other Reaction(s): Hallucination   Verapamil Other (See Comments)   Other Reaction(s): Circulation issues   Zolpidem Nausea And Vomiting, Other (See Comments)   Caused the patient to be shaky, too   Amoxicillin Diarrhea   Cherry Rash   Hydroxyzine  Hcl Other (See Comments)   Metformin Other (See Comments)   Headaches and migraines   Tizanidine Hcl Other (See Comments)   Dizziness, unsteady on her feet.         Medication List     STOP taking these medications    azithromycin  250 MG tablet Commonly known as: ZITHROMAX   cyanocobalamin  1000 MCG/ML injection Commonly known as: VITAMIN B12       TAKE these medications    aspirin  EC 325 MG tablet Take 325 mg by mouth 2 (two) times daily as needed (for headaches).   levothyroxine  50 MCG tablet Commonly known as: SYNTHROID  Take 50 mcg by mouth See admin instructions. Take 50 mcg by mouth 30 minutes before breakfast on even-numbered days, (alternating with 75 mcg on odd-numbered days)   levothyroxine  75 MCG tablet Commonly known as: SYNTHROID  Take 75 mcg by mouth See admin instructions. Take 75 mcg by mouth 30 minutes before breakfast on odd-numbered days, (alternating with 50 mcg on even-numbered days)   losartan  100 MG tablet Commonly known as: COZAAR  Take 1 tablet (100 mg  total) by mouth daily. Start taking on: November 20, 2024 What changed: when to take this   morphine  30 MG tablet Commonly known as: MSIR Take 1 tablet (30 mg total) by mouth every 8 (eight) hours as needed for pain. What changed: Another medication with the same name was removed. Continue taking this medication, and follow the directions you see here.   nystatin  powder Apply 1 Application topically 2 (two) times daily as needed (skin irritations).   ondansetron  4 MG disintegrating tablet Commonly known as: ZOFRAN -ODT Take 4-8 mg by mouth every 8 (eight) hours as needed for nausea or vomiting (DISSOLVE ORALLY).   pantoprazole  40 MG tablet Commonly known as: PROTONIX  Take 40 mg by mouth daily before breakfast.   polyethylene glycol powder 17 GM/SCOOP powder Commonly known as: GLYCOLAX /MIRALAX  Dissolve 1 capful (17g) in 4-8 ounces of liquid and take by mouth 2 (two) times daily.   predniSONE  1 MG tablet Commonly known as: DELTASONE  Take 4 mg by mouth daily with breakfast.   senna 8.6 MG Tabs tablet Commonly known as: SENOKOT Take 1 tablet (8.6 mg total) by mouth daily.   Viactiv Calcium  Plus D 650-12.5-40 MG-MCG Chew Generic drug: Calcium -Vitamin D -Vitamin K  Chew 1-2 tablets by mouth daily.        Follow-up Information     Okey Carlin Redbird, MD Follow up in 2 week(s).   Specialty: Family Medicine Why: Hospital follow up Contact information: [address] [place] [address] [phone]         Gean Toribio LABOR, MD. Schedule an appointment as soon as possible for a visit.   Specialty: Gastroenterology Why: Hospital follow up Contact information: [address] [place] [address] [phone]                Discharge Exam: Fredricka Weights   11/15/24 2027  Weight: 50.8 kg   General exam: Awake, laying in bed, in nad Respiratory system: Normal respiratory effort, no wheezing Cardiovascular system: regular rate, s1, s2 Gastrointestinal  system: Soft, nondistended, positive BS Central nervous system: CN2-12 grossly intact, strength intact Extremities: Perfused, no clubbing Skin: Normal skin turgor, no notable skin lesions seen Psychiatry: Mood normal // no visual hallucinations   Condition at discharge: fair  The results of significant diagnostics from this hospitalization (including imaging, microbiology, ancillary and laboratory) are listed below for reference.   Imaging Studies: ECHOCARDIOGRAM COMPLETE Result Date: 11/19/2024    ECHOCARDIOGRAM REPORT   Patient Name:   DONNEISHA BEANE Date of Exam: 11/19/2024 Medical Rec #:  [id number]       Height:       59.0 in Accession #:    [id number]      Weight:       112.0 lb  Date of Birth:  26-Apr-1951       BSA:          1.442 m Patient Age:    73 years        BP:           156/83 mmHg Patient Gender: F               HR:           73 bpm. Exam Location:  Inpatient Procedure: 2D Echo, Cardiac Doppler and Color Doppler (Both Spectral and Color            Flow Doppler were utilized during procedure). Indications:    Chest Pain  History:        Patient has prior history of Echocardiogram examinations, most                 recent 09/06/2019. Signs/Symptoms:Chest Pain; Risk                 Factors:Hypertension.  Sonographer:    Sherlean Dubin Referring Phys: GARNETTE POUR Tyger Oka  Sonographer Comments: Image acquisition challenging due to patient body habitus and Image acquisition challenging due to respiratory motion. IMPRESSIONS  1. Left ventricular ejection fraction, by estimation, is 65 to 70%. The left ventricle has normal function. The left ventricle has no regional wall motion abnormalities. Left ventricular diastolic parameters are consistent with Grade I diastolic dysfunction (impaired relaxation).  2. Right ventricular systolic function is normal. The right ventricular size is normal. Tricuspid regurgitation signal is inadequate for assessing PA pressure.  3. The mitral valve is degenerative.  Trivial mitral valve regurgitation. No evidence of mitral stenosis.  4. The aortic valve is tricuspid. There is mild calcification of the aortic valve. Aortic valve regurgitation is not visualized. Aortic valve sclerosis/calcification is present, without any evidence of aortic stenosis.  5. The inferior vena cava is normal in size with greater than 50% respiratory variability, suggesting right atrial pressure of 3 mmHg. FINDINGS  Left Ventricle: Left ventricular ejection fraction, by estimation, is 65 to 70%. The left ventricle has normal function. The left ventricle has no regional wall motion abnormalities. The left ventricular internal cavity size was normal in size. There is  no left ventricular hypertrophy. Left ventricular diastolic parameters are consistent with Grade I diastolic dysfunction (impaired relaxation). Right Ventricle: The right ventricular size is normal. No increase in right ventricular wall thickness. Right ventricular systolic function is normal. Tricuspid regurgitation signal is inadequate for assessing PA pressure. Left Atrium: Left atrial size was normal in size. Right Atrium: Right atrial size was normal in size. Pericardium: There is no evidence of pericardial effusion. Mitral Valve: The mitral valve is degenerative in appearance. Mild mitral annular calcification. Trivial mitral valve regurgitation. No evidence of mitral valve stenosis. Tricuspid Valve: The tricuspid valve is normal in structure. Tricuspid valve regurgitation is not demonstrated. No evidence of tricuspid stenosis. Aortic Valve: The aortic valve is tricuspid. There is mild calcification of the aortic valve. Aortic valve regurgitation is not visualized. Aortic valve sclerosis/calcification is present, without any evidence of aortic stenosis. Aortic valve mean gradient measures 2.0 mmHg. Aortic valve peak gradient measures 2.8 mmHg. Aortic valve area, by VTI measures 2.40 cm. Pulmonic Valve: The pulmonic valve was not well  visualized. Pulmonic valve regurgitation is not visualized. No evidence of pulmonic stenosis. Aorta: The aortic root is normal in size and structure. Venous: The inferior vena cava is normal in size with greater than 50% respiratory variability, suggesting  right atrial pressure of 3 mmHg. IAS/Shunts: No atrial level shunt detected by color flow Doppler.  LEFT VENTRICLE PLAX 2D LVIDd:         3.50 cm   Diastology LVIDs:         2.30 cm   LV e' medial:    7.72 cm/s LV PW:         1.00 cm   LV E/e' medial:  10.0 LV IVS:        1.00 cm   LV e' lateral:   8.38 cm/s LVOT diam:     2.00 cm   LV E/e' lateral: 9.2 LV SV:         49 LV SV Index:   34 LVOT Area:     3.14 cm  RIGHT VENTRICLE             IVC RV Basal diam:  2.40 cm     IVC diam: 1.30 cm RV Mid diam:    2.30 cm RV S prime:     13.20 cm/s  PULMONARY VEINS TAPSE (M-mode): 1.4 cm      Diastolic Velocity: 33.50 cm/s                             S/D Velocity:       1.70                             Systolic Velocity:  56.10 cm/s LEFT ATRIUM           Index        RIGHT ATRIUM          Index LA diam:      2.40 cm 1.66 cm/m   RA Area:     9.05 cm LA Vol (A4C): 16.2 ml 11.24 ml/m  RA Volume:   18.60 ml 12.90 ml/m  AORTIC VALVE AV Area (Vmax):    2.79 cm AV Area (Vmean):   2.44 cm AV Area (VTI):     2.40 cm AV Vmax:           83.70 cm/s AV Vmean:          64.700 cm/s AV VTI:            0.206 m AV Peak Grad:      2.8 mmHg AV Mean Grad:      2.0 mmHg LVOT Vmax:         74.45 cm/s LVOT Vmean:        50.300 cm/s LVOT VTI:          0.158 m LVOT/AV VTI ratio: 0.76  AORTA Ao Root diam: 3.30 cm Ao Asc diam:  2.90 cm MITRAL VALVE               TRICUSPID VALVE MV Area (PHT): 2.95 cm    TR Peak grad:   5.6 mmHg MV Decel Time: 257 msec    TR Vmax:        118.00 cm/s MV E velocity: 77.00 cm/s MV A velocity: 85.90 cm/s  SHUNTS MV E/A ratio:  0.90        Systemic VTI:  0.16 m                            Systemic Diam: 2.00 cm Toribio Fuel MD Electronically signed by Toribio Fuel MD  Signature Date/Time: 11/19/2024/12:49:02 PM    Final    CT ABDOMEN PELVIS WO CONTRAST Result Date: 11/16/2024 EXAM: CT ABDOMEN AND PELVIS WITHOUT CONTRAST 11/16/2024 07:43:22 PM TECHNIQUE: CT of the abdomen and pelvis was performed without the administration of intravenous contrast. Multiplanar reformatted images are provided for review. Automated exposure control, iterative reconstruction, and/or weight-based adjustment of the mA/kV was utilized to reduce the radiation dose to as low as reasonably achievable. COMPARISON: Ultrasound abdomen 11/16/2024. CT abdomen and pelvis 10/14/2021. CLINICAL HISTORY: Abdominal pain, acute, nonlocalized. FINDINGS: LOWER CHEST: There is a calcified granuloma in the right lower lobe. Thoracic spinal cord stimulator device partially imaged. LIVER: There is a calcified granuloma in the liver. GALLBLADDER AND BILE DUCTS: The gallbladder is surgically absent. Common bile duct is dilated measuring up to 1 cm, unchanged from prior. SPLEEN: There are calcified granulomas in the spleen. PANCREAS: Diffuse pancreatic atrophy and fatty replacement are stable from prior. ADRENAL GLANDS: No acute abnormality. KIDNEYS, URETERS AND BLADDER: There are punctate bilateral renal calculi which have increased in size and number. No hydronephrosis. No perinephric or periureteral stranding. Urinary bladder is unremarkable. GI AND BOWEL: Stomach demonstrates no acute abnormality. The appendix is not visualized. There is a large amount of stool throughout the colon. There is no bowel obstruction. PERITONEUM AND RETROPERITONEUM: No ascites. No free air. VASCULATURE: Aorta is normal in caliber. There are atherosclerotic calcifications of the aorta. LYMPH NODES: No lymphadenopathy. REPRODUCTIVE ORGANS: The uterus is surgically absent. BONES AND SOFT TISSUES: There is levoconvex scoliosis of the thoracolumbar spine. The bones are diffusely osteopenic. Chronic compression deformity of L1 is  unchanged. No focal soft tissue abnormality. IMPRESSION: 1. No acute abnormality in the abdomen or pelvis. 2. Punctate bilateral nephrolithiasis with interval increase in size and number. Electronically signed by: Greig Pique MD 11/16/2024 07:57 PM EST RP Workstation: [id number]   US  Abdomen Limited RUQ (LIVER/GB) Result Date: 11/16/2024 EXAM: Right Upper Quadrant Abdominal Ultrasound 11/16/2024 01:28:23 AM TECHNIQUE: Real-time ultrasonography of the right upper quadrant of the abdomen was performed. COMPARISON: CT abdomen/pelvis dated 10/14/2021. CLINICAL HISTORY: Elevated LFTs. FINDINGS: LIVER: Hyperechoic hepatic parenchyma, suggesting hepatic steatosis. Mildly nodular hepatic contour. No intrahepatic biliary ductal dilatation. No evidence of mass. BILIARY SYSTEM: Status post cholecystectomy. Common bile duct measures 11 mm, likely postsurgical. OTHER: No right upper quadrant ascites. IMPRESSION: 1. Suspected hepatic steatosis. 2. Mildly nodular hepatic contour, correlate for early cirrhosis. 3. Status post cholecystectomy. Mildly dilated common duct, likely postsurgical. Electronically signed by: Pinkie Pebbles MD 11/16/2024 01:31 AM EST RP Workstation: [id number]   DG Chest Portable 1 View Result Date: 11/15/2024 EXAM: 1 VIEW(S) XRAY OF THE CHEST 11/15/2024 09:45:00 PM COMPARISON: 01/30/2024 CLINICAL HISTORY: Chest Pain FINDINGS: LUNGS AND PLEURA: No focal pulmonary opacity. No pleural effusion. No pneumothorax. HEART AND MEDIASTINUM: Aortic atherosclerotic calcification. No acute abnormality of the cardiac and mediastinal silhouettes. BONES AND SOFT TISSUES: Left axillary surgical clips noted. Cervical spinal fusion hardware in place. Thoracic spinal cord stimulator device in place. Scoliosis. IMPRESSION: 1. No acute cardiopulmonary process identified. 2. Aortic atherosclerosis. Electronically signed by: Oneil Devonshire MD 11/15/2024 09:57 PM EST RP Workstation: [id number]    Microbiology: Results  for orders placed or performed during the hospital encounter of 01/24/24  Blood Culture (routine x 2)     Status: None   Collection Time: 01/24/24  1:09 PM   Specimen: BLOOD  Result Value Ref Range Status   Specimen Description   Final    BLOOD RIGHT ANTECUBITAL Performed at [facility]  [facility]ospital, [address], [place], [place] [address]    Special Requests   Final    BOTTLES DRAWN AEROBIC AND ANAEROBIC Blood Culture results may not be optimal due to an inadequate volume of blood received in culture bottles Performed at [facility], [address] [place], [place] [address]    Culture   Final    NO GROWTH 5 DAYS Performed at [facility], [address] [place], [place] [address]    Report Status 01/29/2024 FINAL  Final  Resp panel by RT-PCR (RSV, Flu A&B, Covid) Anterior Nasal Swab     Status: None   Collection Time: 01/24/24  1:40 PM   Specimen: Anterior Nasal Swab  Result Value Ref Range Status   SARS Coronavirus 2 by RT PCR NEGATIVE NEGATIVE Final    Comment: (NOTE) SARS-CoV-2 target nucleic acids are NOT DETECTED.  The SARS-CoV-2 RNA is generally detectable in upper respiratory specimens during the acute phase of infection. The lowest concentration of SARS-CoV-2 viral copies this assay can detect is 138 copies/mL. A negative result does not preclude SARS-Cov-2 infection and should not be used as the sole basis for treatment or other patient management decisions. A negative result may occur with  improper specimen collection/handling, submission of specimen other than nasopharyngeal swab, presence of viral mutation(s) within the areas targeted by this assay, and inadequate number of viral copies(<138 copies/mL). A negative result must be combined with clinical observations, patient history, and epidemiological information. The expected result is Negative.  Fact Sheet for Patients:  bloggercourse.com  Fact  Sheet for Healthcare Providers:  seriousbroker.it  This test is no t yet approved or cleared by the [place]  FDA and  has been authorized for detection and/or diagnosis of SARS-CoV-2 by FDA under an Emergency Use Authorization (EUA). This EUA will remain  in effect (meaning this test can be used) for the duration of the COVID-19 declaration under Section 564(b)(1) of the Act, 21 U.S.C.section 360bbb-3(b)(1), unless the authorization is terminated  or revoked sooner.       Influenza A by PCR NEGATIVE NEGATIVE Final   Influenza B by PCR NEGATIVE NEGATIVE Final    Comment: (NOTE) The Xpert Xpress SARS-CoV-2/FLU/RSV plus assay is intended as an aid in the diagnosis of influenza from Nasopharyngeal swab specimens and should not be used as a sole basis for treatment. Nasal washings and aspirates are unacceptable for Xpert Xpress SARS-CoV-2/FLU/RSV testing.  Fact Sheet for Patients: bloggercourse.com  Fact Sheet for Healthcare Providers: seriousbroker.it  This test is not yet approved or cleared by the [place]  FDA and has been authorized for detection and/or diagnosis of SARS-CoV-2 by FDA under an Emergency Use Authorization (EUA). This EUA will remain in effect (meaning this test can be used) for the duration of the COVID-19 declaration under Section 564(b)(1) of the Act, 21 U.S.C. section 360bbb-3(b)(1), unless the authorization is terminated or revoked.     Resp Syncytial Virus by PCR NEGATIVE NEGATIVE Final    Comment: (NOTE) Fact Sheet for Patients: bloggercourse.com  Fact Sheet for Healthcare Providers: seriousbroker.it  This test is not yet approved or cleared by the [place]  FDA and has been authorized for detection and/or diagnosis of SARS-CoV-2 by FDA under an Emergency Use Authorization (EUA). This EUA will remain in effect  (meaning this test can be used) for the duration of the COVID-19 declaration under Section 564(b)(1) of the Act, 21 U.S.C. section 360bbb-3(b)(1), unless the authorization is terminated or revoked.  Performed at  [facility], [address] [place], [place] [address]   MRSA Next Gen by PCR, Nasal     Status: None   Collection Time: 01/24/24 11:41 PM   Specimen: Nasal Mucosa; Nasal Swab  Result Value Ref Range Status   MRSA by PCR Next Gen NOT DETECTED NOT DETECTED Final    Comment: (NOTE) The GeneXpert MRSA Assay (FDA approved for NASAL specimens only), is one component of a comprehensive MRSA colonization surveillance program. It is not intended to diagnose MRSA infection nor to guide or monitor treatment for MRSA infections. Test performance is not FDA approved in patients less than 38 years old. Performed at [facility], [address] [place], [place] [address]   Blood Culture (routine x 2)     Status: None   Collection Time: 01/25/24  3:37 AM   Specimen: BLOOD  Result Value Ref Range Status   Specimen Description   Final    BLOOD BLOOD RIGHT HAND Performed at [facility], [address], [place], [place] [address]    Special Requests   Final    BOTTLES DRAWN AEROBIC ONLY Blood Culture results may not be optimal due to an inadequate volume of blood received in culture bottles Performed at [facility], [address] [place], [place] [address]    Culture   Final    NO GROWTH 5 DAYS Performed at [facility], [address], [place], [place] [address]    Report Status 01/30/2024 FINAL  Final    Labs: CBC: Recent Labs  Lab 11/15/24 2107 11/15/24 2110 11/16/24 0500 11/17/24 0739 11/18/24 0411  WBC 7.4  --  4.8 5.0 5.1  HGB 10.7* 11.9* 10.0* 10.5* 10.1*  HCT 34.2* 35.0* 32.1* 32.9* 32.1*  MCV 85.1  --  84.7 83.9 82.9  PLT 247  --  237 247 257   Basic Metabolic Panel: Recent  Labs  Lab 11/15/24 2107 11/15/24 2110 11/16/24 0500 11/17/24 0739 11/18/24 0411 11/19/24 0159  NA 138 137 135 138 140 139  K 4.7 5.2* 3.5 3.8 3.4* 4.9  CL 102 105 100 102 104 105  CO2 26  --  27 26 25 28   GLUCOSE 97 97 85 75 73 106*  BUN 15 18 13 13 8 10   CREATININE 0.79 0.80 0.74 0.77 0.77 1.07*  CALCIUM  8.2*  --  8.1* 8.5* 8.2* 9.0   Liver Function Tests: Recent Labs  Lab 11/15/24 2107 11/16/24 0500 11/17/24 0739 11/18/24 0411 11/19/24 0159  AST 243* 369* 189* 78* 44*  ALT 64* 145* 143* 96* 81*  ALKPHOS 168* 188* 174* 146* 155*  BILITOT 1.3* 1.3* 1.2 0.8 0.3  PROT 5.8* 5.9* 6.0* 5.7* 5.6*  ALBUMIN  3.1* 3.0* 3.1* 3.0* 3.3*   CBG: No results for input(s): GLUCAP in the last 168 hours.  Discharge time spent: less than 30 minutes.  Signed: Garnette Pelt, MD [facility] 11/19/2024

## 2024-11-20 ENCOUNTER — Other Ambulatory Visit (HOSPITAL_COMMUNITY): Payer: Self-pay

## 2024-12-10 ENCOUNTER — Other Ambulatory Visit (HOSPITAL_COMMUNITY): Payer: Self-pay

## 2024-12-10 MED ORDER — MORPHINE SULFATE 30 MG PO TABS: 30.0000 mg | ORAL_TABLET | Freq: Three times a day (TID) | ORAL | 0 refills | Status: AC | PRN

## 2024-12-10 MED ORDER — MORPHINE SULFATE 30 MG PO TABS
30.0000 mg | ORAL_TABLET | Freq: Three times a day (TID) | ORAL | 0 refills | Status: AC | PRN
  Filled 2024-12-19: qty 90, 30d supply, fill #0

## 2024-12-11 ENCOUNTER — Other Ambulatory Visit (HOSPITAL_COMMUNITY): Payer: Self-pay

## 2024-12-19 ENCOUNTER — Other Ambulatory Visit (HOSPITAL_COMMUNITY): Payer: Self-pay

## 2025-01-20 ENCOUNTER — Ambulatory Visit: Admitting: Neurology
